# Patient Record
Sex: Female | Born: 1945
Health system: Southern US, Community
[De-identification: ages and names within clinical notes are randomized; demographics above are authoritative.]

## PROBLEM LIST (undated history)

## (undated) DIAGNOSIS — I509 Heart failure, unspecified: Secondary | ICD-10-CM

## (undated) DIAGNOSIS — E785 Hyperlipidemia, unspecified: Secondary | ICD-10-CM

## (undated) DIAGNOSIS — R0600 Dyspnea, unspecified: Secondary | ICD-10-CM

## (undated) DIAGNOSIS — F32A Depression, unspecified: Secondary | ICD-10-CM

## (undated) DIAGNOSIS — M316 Other giant cell arteritis: Secondary | ICD-10-CM

## (undated) DIAGNOSIS — I499 Cardiac arrhythmia, unspecified: Secondary | ICD-10-CM

## (undated) DIAGNOSIS — F329 Major depressive disorder, single episode, unspecified: Secondary | ICD-10-CM

## (undated) DIAGNOSIS — I10 Essential (primary) hypertension: Secondary | ICD-10-CM

## (undated) DIAGNOSIS — G43909 Migraine, unspecified, not intractable, without status migrainosus: Secondary | ICD-10-CM

## (undated) DIAGNOSIS — D589 Hereditary hemolytic anemia, unspecified: Secondary | ICD-10-CM

## (undated) DIAGNOSIS — M353 Polymyalgia rheumatica: Secondary | ICD-10-CM

## (undated) DIAGNOSIS — M109 Gout, unspecified: Secondary | ICD-10-CM

## (undated) DIAGNOSIS — F419 Anxiety disorder, unspecified: Secondary | ICD-10-CM

## (undated) DIAGNOSIS — J45909 Unspecified asthma, uncomplicated: Secondary | ICD-10-CM

## (undated) HISTORY — DX: Hyperlipidemia, unspecified: E78.5

## (undated) HISTORY — PX: EYE MUSCLE SURGERY: SHX370

## (undated) HISTORY — PX: BARTHOLIN GLAND CYST EXCISION: SHX565

## (undated) HISTORY — DX: Migraine, unspecified, not intractable, without status migrainosus: G43.909

## (undated) HISTORY — DX: Major depressive disorder, single episode, unspecified: F32.9

## (undated) HISTORY — DX: Other giant cell arteritis: M31.6

## (undated) HISTORY — DX: Gout, unspecified: M10.9

## (undated) HISTORY — DX: Polymyalgia rheumatica: M35.3

## (undated) HISTORY — PX: BREAST BIOPSY: SHX20

## (undated) HISTORY — DX: Anxiety disorder, unspecified: F41.9

## (undated) HISTORY — PX: TONSILLECTOMY: SUR1361

## (undated) HISTORY — DX: Depression, unspecified: F32.A

## (undated) HISTORY — PX: OTHER SURGICAL HISTORY: SHX169

## (undated) HISTORY — DX: Essential (primary) hypertension: I10

---

## 1998-08-22 ENCOUNTER — Other Ambulatory Visit: Admission: RE | Admit: 1998-08-22 | Discharge: 1998-08-22 | Payer: Self-pay | Admitting: Obstetrics and Gynecology

## 2000-12-26 ENCOUNTER — Other Ambulatory Visit: Admission: RE | Admit: 2000-12-26 | Discharge: 2000-12-26 | Payer: Self-pay

## 2002-08-20 HISTORY — PX: KNEE SURGERY: SHX244

## 2003-01-04 ENCOUNTER — Encounter: Admission: RE | Admit: 2003-01-04 | Discharge: 2003-02-12 | Payer: Self-pay | Admitting: Family Medicine

## 2003-04-05 ENCOUNTER — Encounter: Admission: RE | Admit: 2003-04-05 | Discharge: 2003-06-02 | Payer: Self-pay | Admitting: *Deleted

## 2003-05-17 ENCOUNTER — Encounter (INDEPENDENT_AMBULATORY_CARE_PROVIDER_SITE_OTHER): Payer: Self-pay | Admitting: Specialist

## 2003-05-17 ENCOUNTER — Ambulatory Visit (HOSPITAL_COMMUNITY): Admission: RE | Admit: 2003-05-17 | Discharge: 2003-05-17 | Payer: Self-pay | Admitting: Gynecology

## 2003-05-17 ENCOUNTER — Ambulatory Visit (HOSPITAL_BASED_OUTPATIENT_CLINIC_OR_DEPARTMENT_OTHER): Admission: RE | Admit: 2003-05-17 | Discharge: 2003-05-17 | Payer: Self-pay | Admitting: Gynecology

## 2003-07-09 ENCOUNTER — Other Ambulatory Visit: Admission: RE | Admit: 2003-07-09 | Discharge: 2003-07-09 | Payer: Self-pay | Admitting: Gynecology

## 2003-07-19 ENCOUNTER — Ambulatory Visit (HOSPITAL_COMMUNITY): Admission: RE | Admit: 2003-07-19 | Discharge: 2003-07-19 | Payer: Self-pay | Admitting: Gastroenterology

## 2004-08-27 ENCOUNTER — Emergency Department (HOSPITAL_COMMUNITY): Admission: EM | Admit: 2004-08-27 | Discharge: 2004-08-27 | Payer: Self-pay | Admitting: Emergency Medicine

## 2004-08-29 ENCOUNTER — Emergency Department (HOSPITAL_COMMUNITY): Admission: EM | Admit: 2004-08-29 | Discharge: 2004-08-29 | Payer: Self-pay | Admitting: *Deleted

## 2004-09-08 ENCOUNTER — Other Ambulatory Visit: Admission: RE | Admit: 2004-09-08 | Discharge: 2004-09-08 | Payer: Self-pay | Admitting: Gynecology

## 2006-01-07 ENCOUNTER — Other Ambulatory Visit: Admission: RE | Admit: 2006-01-07 | Discharge: 2006-01-07 | Payer: Self-pay | Admitting: Gynecology

## 2006-03-27 ENCOUNTER — Observation Stay (HOSPITAL_COMMUNITY): Admission: EM | Admit: 2006-03-27 | Discharge: 2006-03-27 | Payer: Self-pay | Admitting: Emergency Medicine

## 2007-01-15 ENCOUNTER — Other Ambulatory Visit: Admission: RE | Admit: 2007-01-15 | Discharge: 2007-01-15 | Payer: Self-pay | Admitting: Obstetrics and Gynecology

## 2008-01-13 ENCOUNTER — Emergency Department (HOSPITAL_COMMUNITY): Admission: EM | Admit: 2008-01-13 | Discharge: 2008-01-13 | Payer: Self-pay | Admitting: Family Medicine

## 2008-06-16 ENCOUNTER — Ambulatory Visit: Payer: Self-pay | Admitting: General Practice

## 2009-06-21 ENCOUNTER — Ambulatory Visit: Payer: Self-pay | Admitting: General Practice

## 2010-02-06 ENCOUNTER — Other Ambulatory Visit: Admission: RE | Admit: 2010-02-06 | Discharge: 2010-02-06 | Payer: Self-pay | Admitting: Obstetrics and Gynecology

## 2010-02-06 ENCOUNTER — Ambulatory Visit: Payer: Self-pay | Admitting: Obstetrics and Gynecology

## 2010-06-14 ENCOUNTER — Ambulatory Visit: Payer: Self-pay | Admitting: General Practice

## 2010-10-22 ENCOUNTER — Inpatient Hospital Stay (INDEPENDENT_AMBULATORY_CARE_PROVIDER_SITE_OTHER)
Admission: RE | Admit: 2010-10-22 | Discharge: 2010-10-22 | Disposition: A | Discharge status: Home / Self Care | Payer: BC Managed Care – PPO | Source: Ambulatory Visit | Attending: Family Medicine | Admitting: Family Medicine

## 2010-10-22 DIAGNOSIS — H5710 Ocular pain, unspecified eye: Secondary | ICD-10-CM

## 2011-01-05 NOTE — Consult Note (Signed)
[name], [name] NO.:  [id number]   MEDICAL RECORD NO.:  [id number]          PATIENT TYPE:  INP   LOCATION:  4703                         FACILITY:  [facility]   PHYSICIAN:  Francisca December, M.D.  DATE OF BIRTH:  10-18-1945   DATE OF CONSULTATION:  03/27/2006  DATE OF DISCHARGE:                                   CONSULTATION   REASON FOR CONSULTATION:  Chest pain.   HISTORY OF PRESENT ILLNESS:  Michelle Wilcox is a very pleasant, 65 year old,  Caucasian woman with known cardiac history.  Yesterday, around 4 p.m. or 5  p.m., after leaving her office and starting her care, she had the sudden  onset of left chest pressure.  This was over the left breast.  It felt as  someone was pushing down on her chest.  It radiated into the shoulder and  there was numbness around the breast and into the left arm.  There was no  shortness of breath, nausea, vomiting, diaphoresis or dizziness.  The pain  did not change with deep breaths or with lying or sitting.  She presented to  Urgent Care where she received a single sublingual nitroglycerin and was  transported to [facility] ER where she received additional sublingual  nitroglycerin without significant pain relief.  The discomfort in her chest  was waning by that time.  It finally resolved after about 1-1/2 hours and  receiving IV morphine in the emergency room.  At time of my evaluation at  noon on March 27, 2006, she is not having any discomfort.  She has not had  discomfort since it resolved.  She has not been short of breath.   PAST MEDICAL HISTORY:  1. Hypertension.  2. Borderline dyslipidemia.  3. History of hypoglycemia.  4. Osteoarthritis.   ALLERGIES:  PENICILLIN, SULFA, CIPRO, CEFTIN, DARVOCET.   CURRENT MEDICATIONS:  1. Metoprolol 50 mg in the a.m., 25 mg the p.m.  2. HCTZ 12.5 mg p.o. daily.  3. Aspirin 81 mg p.o. daily.  4. Tylenol 500 mg daily.  5. Glucosamine.  6. Chondroitin.  7. Multivitamin.   PAST  SURGICAL HISTORY:  She is status post right knee replacement.   FAMILY HISTORY:  She is adopted, but her mother biological mother is alive  and she had a stroke.  Her maternal uncle died at age 50 of a myocardial  infarction.   SOCIAL HISTORY:  She is divorced with one adult son.  She is employed as a  IT sales professional.  No tobacco or illicit drug use.  Occasional alcohol.  She  does exercise three times a week for over an hour each time aerobically.   REVIEW OF SYSTEMS:  She has had intentional 25 pound weight loss, otherwise  negative.   PHYSICAL EXAMINATION:  VITAL SIGNS:  Blood pressure is 124/75, pulse is 64  and regular, respiratory rate 20, temperature 97.8, O2 saturation on room  air 96%.  GENERAL:  This a well-appearing, comfortable, 65 year old woman in no acute  distress, appears younger than her stated age.  Well-kept.  HEENT:  Unremarkable.  NECK:  Neck is supple without JVD or carotid bruit.  There is bilateral  carotid upstrokes that are normal.  No thyromegaly.  CHEST:  Clear with good excursion.  HEART:  Regular rhythm.  Normal S1-S2, no S3-S4 murmur, click or rub noted.  ABDOMEN:  Soft, nontender, no hepatosplenomegaly or midline pulsatile mass.  PELVIC:  External genitalia without lesion.  RECTAL:  Not performed.  EXTREMITIES:  Extremities show full range of motion.  No edema.  Intact  distal pulses.  NEUROLOGICAL:  Cranial nerves 2-12 intact.  Motor and sensory grossly  intact.  Gait not tested.  SKIN:  Skin is warm, dry and clear.   LABORATORY DATA AND X-RAY FINDINGS:  Admission hemogram is normal, although  hematocrit level upper limit of abnormal.  Serum electrolytes are normal  with the exception of potassium at 3.0 and blood sugar of 148, nonfasting.  CK-MB, troponin negative x2.  LDL cholesterol 68, HDL 55.   Electrocardiogram normal sinus rhythm x2.  Chest x-ray with no active  cardiopulmonary disease.   IMPRESSION:  1. Atypical angina in a  65 year old woman with minimal risk factors for      coronary heart disease.  She is postmenopausal and has hypertension.      Lipids are really not excessively elevated.  2. Myocardial infarction, ruled out.  3. Hypokalemia.  4. Osteoarthritis.  5. Hyperglycemia  6. Hypertension, adequately control.   RECOMMENDATIONS:  I agree with your management thus far which includes  aspirin and rule out a myocardial function with telemetry monitoring.  This  has been completed.  The patient needs exercise treadmill testing with  Cardiolite myocardial perfusion imaging.  Will arrange for this test as an  outpatient in my office tomorrow.  She is to be there at noon.  I have  instructed her to not eat, no caffeine x12 hours and not to take her  metoprolol in the morning.   Thank you very much for allowing [place] to assist in the care of [name].  It has been a pleasure to do so.  I will discuss her further care  with you.      Francisca December, M.D.  Electronically Signed    JHE/MEDQ  D:  03/27/2006  T:  03/27/2006  Job:  [id number]   cc:   Pam Drown, M.D.

## 2011-01-05 NOTE — H&P (Signed)
NAMEGABRIELL, Michelle Wilcox                 ACCOUNT NO.:  [id number]   MEDICAL RECORD NO.:  [id number]          PATIENT TYPE:  INP   LOCATION:  4703                         FACILITY:  [facility]   PHYSICIAN:  Kela Millin, M.D.DATE OF BIRTH:  01/16/1946   DATE OF ADMISSION:  03/26/2006  DATE OF DISCHARGE:                                HISTORY & PHYSICAL   The primary care physician is Dr. Uvaldo Rising.   CHIEF COMPLAINT:  Chest pain.   HISTORY OF PRESENT ILLNESS:  The patient is a 65 year old white female, with  past medical history significant for hypertension and borderline  cholesterol, who presents with complaints of chest pain.  She states that  after leaving her office today she walked through the parking lot to her car  and just after she started the car she began experiencing an intense left  precordial pressure 8/10 in intensity.  She states that it was associated  with left arm numbness.  She denies any shortness of breath.  Also no  associated nausea and vomiting.  She reports that she usually exercises  regularly and has been doing well lately, intentionally losing about 25  pounds with an improvement in her blood pressure.  She states that she has  had a lot of anxiety lately over some home repairs that she needs to do.  The patient denies cough, fevers, dysuria, diarrhea, hematochezia.  No  melena.  The patient states that she initially went to urgent care where she  was given some nitroglycerin with some initial improvement of the pain to  about 3/10 in intensity and then she was transferred to the [facility] ER where  she received more nitroglycerin, and the pain remained about the same that  is 3/10 in intensity until she was given morphine which completely relieved  the pain.   The patient was seen in the ER, and an EGD showed normal sinus rhythm with  no ischemic changes.  Point of care markers were negative.  She was admitted  to the [facility] for further  evaluation and management.   PAST MEDICAL HISTORY:  1. As stated above.  2. History of hypoglycemia.   PAST SURGICAL HISTORY:  Status post right knee surgery.   MEDICATIONS:  1. Metoprolol 50 mg p.o. q.a.m. and 25 mg p.o. q.p.m.  2. HCTZ.  3. Aspirin 81 mg daily.  4. Tylenol p.r.n.  5. Glucosamine.   ALLERGIES:  PENICILLIN, SULFA, CIPRO, CEFTIN and DARVOCET.   SOCIAL HISTORY:  She denies tobacco.  Occasional alcohol.   FAMILY HISTORY:  She states that she is adopted but knows that her  biological mother had a stroke, and her uncle died of an MI in his 47s.   REVIEW OF SYSTEMS:  As per HPI.  Other review of systems negative.   PHYSICAL EXAMINATION:  GENERAL:  The patient is an elderly white female,  anxious appearing, in no respiratory distress.  VITAL SIGNS:  Her temperature is 97 and blood pressure 125/73.  Initially  143/73.  Her pulse is 85.  Respiratory rate is 18.  O2 sat is  100%.  HEENT:  PERRL.  EOMI.  Sclerae are anicteric.  Moist mucous membranes.  No  oral exudates.  NECK:  Supple.  No adenopathy.  No thyromegaly.  LUNGS:  Clear to auscultation bilaterally.  No crackles or wheezes.  CARDIOVASCULAR:  Regular rate and rhythm.  Normal S1 and S2.  No murmurs  appreciated and no gallops.  ABDOMEN:  Soft.  Bowel sounds present.  Nontender and nondistended.  No organomegaly.  No masses palpable.  EXTREMITIES:  No cyanosis and no edema.  NEURO:  Alert and oriented x3.  Cranial nerves II-XII are grossly intact.  Nonfocal exam.   LABORATORY DATA:  Point of care makers:  Negative x1.  Hemoglobin is 12.1,  hematocrit 34.6, white cell count 5.9, platelet count 231.  Sodium 139,  potassium 3.0, chloride 101, CO2 31, glucose 148, BUN 8, creatinine 0.8.  Total bilirubin is 0.4.  Alkaline phosphatase is 41.  SGPT is 11.  Total  protein is 5.4.  Albumin 3.6.  Calcium 8.4.  EKG:  Normal sinus rhythm at a  rate of 80.  No ischemic changes.   ASSESSMENT/PLAN:  1. Chest pain.  As  discussed above, we will obtain serial cardiac enzymes,      EKG.  Place the patient on aspirin, nitrates and Continue beta-      blockers.  Consider cardiology consultation in the a.m. pending above      results for risk stratification.  We will cover with PPI for GI      etiology.  2. Hypokalemia.  Replace potassium.  3. Hypertension.  Continue metoprolol and HCTZ.  4. History of borderline cholesterol.  Recheck a fasting lipid profile      and follow.      Kela Millin, M.D.  Electronically Signed     ACV/MEDQ  D:  03/27/2006  T:  03/27/2006  Job:  [id number]   cc:   Pam Drown, M.D.  Fax: [phone]

## 2011-01-05 NOTE — H&P (Signed)
[name], Michelle Wilcox                             ACCOUNT NO.:  [id number]   MEDICAL RECORD NO.:  [id number]                    PATIENT TYPE:   LOCATION:                                       FACILITY:   PHYSICIAN:  Ivor Costa. Farrel Gobble, M.D.              DATE OF BIRTH:   DATE OF ADMISSION:  DATE OF DISCHARGE:                                HISTORY & PHYSICAL   CHIEF COMPLAINT:  Pelvic vulvar mass.   HISTORY OF PRESENT ILLNESS:  The patient is a 65 year old, G1, P3,  postmenopausal woman who presented to the office on April 24, 2003  complaining of mass in her vagina which she noted after she started working  out on a recumbent bike as a result of physical therapy for arthroscopic  surgery.  The patient states that she noticed some vaginal soreness, at  which point she noticed the mass, and shortly thereafter some vaginal  bleeding.  She was unsure of the source.   She had a history of Bartholin's gland cyst which she thinks may have been  bilateral.  She was unsure of treatment but has had treatment to each of  them.  She was examined in the office and there was indeed some induration  with fullness on the left, questionably in the region of the Bartholin's  gland.  No fluid was expressed.  Based on her history, the patient was  treated with antibiotics and Sitz baths t.i.d. and was asked to return back  to the office two weeks later.   Despite compliance, the patient states while the tenderness has gotten  better, the area of concern has not changed appreciably.  It was noted to be  firm and approximately 2-1/2 cm in its largest diameter.  Because of the  episode of postmenopausal bleeding and the source being unclear, she had  endometrial biopsy which was negative.   PAST OBSTETRICAL/GYNECOLOGIC HISTORY:  Significant for menopause  approximately four to five years ago.  This would have been her first  episode of postmenopausal bleeding.  Although the source is unclear, she had  been getting Pap smears at [facility].  She has had one  spontaneous vaginal delivery.  No other GYN complaints.   PAST MEDICAL HISTORY:  Significant for chronic hypertension and anxiety.   PAST SURGICAL HISTORY:  She had ocular surgery as a child.  She had  tonsillectomy as a child.  She had treatment to a Bartholin's cyst but is  unsure of what.  She had arthroscopy in 2004.   MEDICATIONS:  Atenolol, Vioxx, Celexa, glucosamine, over-the-counter  vitamins.   SOCIAL HISTORY:  She denies tobacco.  She drinks alcohol moderately,  caffeine moderately and exercises as part of physical therapy which has been  on hold since April 27, 2003.   FAMILY HISTORY:  Negative for gynecologic cancers.  There is family history  of colon cancer in  an uncle and coronary artery disease in both parents.   ALLERGIES:  She is allergic to PENICILLIN, SULFA, DARVOCET, and CIPRO.   PHYSICAL EXAMINATION:  GENERAL:  She is well-appearing and in no acute  distress.  HEART:  Regular rate.  LUNGS:  Clear to auscultation.  ABDOMEN:  Soft and nontender without rebound or guarding.  GYNECOLOGIC:  There is a deep scar on the right from a previous I&D or Ward  catheter placement.  On the left, there is induration of firmness that  measuring approximately 2 by 2-1/2 cm.  There was no warmth or induration  noted.  On speculum exam, she has postmenopausal changes to the vagina or  cervix.  Bimanual exam shows there is no cervical motion tenderness.  The  uterus was small and mobile.  The adnexa was negative.  RECTOVAGINAL:  Confirmatory.   IMPRESSION:  Persistent vulvar mass with firmness in a postmenopausal woman.  In part because of her anxiety, as well as her size, I deferred a biopsy in  the office and elected instead to excise the area in the operating room.  Risks and benefits were discussed with the patient and acceptable.  She will  present for definitive therapy in the morning.                                                  Ivor Costa. Farrel Gobble, M.D.    THL/MEDQ  D:  05/16/2003  T:  05/17/2003  Job:  [id number]

## 2011-01-05 NOTE — Op Note (Signed)
NAME:  Michelle Wilcox, Michelle Wilcox                           ACCOUNT NO.:  [id number]   MEDICAL RECORD NO.:  [id number]                   PATIENT TYPE:  AMB   LOCATION:  ENDO                                 FACILITY:  [facility]   PHYSICIAN:  Petra Kuba, M.D.                 DATE OF BIRTH:  10-11-1945   DATE OF PROCEDURE:  07/19/2003  DATE OF DISCHARGE:                                 OPERATIVE REPORT   PROCEDURE:  Colonoscopy.   INDICATIONS:  Family history of colon cancer.  Due for colonic screening.  Consent was signed after risks, benefits, methods, and options were  thoroughly discussed in the office.   PREMEDICATIONS:  Demerol 70 mg, Versed 7 mg.   DESCRIPTION OF PROCEDURE:  Rectal inspection pertinent for small external  hemorrhoids.  Digital exam was negative.  Pediatric video adjustable  colonoscope was inserted and easily advanced to the level of the hepatic  flexure.  To advance to the cecum required rolling her on her back and  various abdominal pressures.  No obvious abnormality was seen on insertion.  The cecum was identified by the appendiceal orifice and the ileocecal valve.  The scope was inserted a short ways into the terminal ileum which was  normal.  Photo documentation was obtained.  The scope was slowly withdrawn.  Prep was adequate.  There was some liquid stool that required washing and  suctioning, but on slow withdrawal back to the rectum, no abnormalities were  seen.  Specifically no polyps, tumors, masses, or diverticula.  Anal rectal  pull and retroflexion confirmed some small hemorrhoids.  Scope was  reinserted a short ways up the left side of the colon.  Air was suctioned  and the scope removed.  The patient tolerated the procedure well.  There  were no obvious immediate complications.   ENDOSCOPIC DIAGNOSES:  1. Internal and external hemorrhoids.  2. Otherwise within normal limits to the terminal ileum.   PLAN:  Yearly rectals and guaiacs per Dr. Tenny Craw.  Happy to  see back p.r.n.  Otherwise repeat screening in five years.                                               Petra Kuba, M.D.    MEM/MEDQ  D:  07/19/2003  T:  07/19/2003  Job:  [id number]   cc:   C. Duane Lope, M.D.  [address]  [place]  [place] [address]  Fax: [phone]

## 2011-01-05 NOTE — Op Note (Signed)
   NAME:  Michelle Wilcox, Michelle Wilcox                           ACCOUNT NO.:  [id number]   MEDICAL RECORD NO.:  [id number]                   PATIENT TYPE:  AMB   LOCATION:  NESC                                 FACILITY:  [facility]   PHYSICIAN:  Ivor Costa. Farrel Gobble, M.D.              DATE OF BIRTH:  March 21, 1946   DATE OF PROCEDURE:  05/17/2003  DATE OF DISCHARGE:                                 OPERATIVE REPORT   PREOPERATIVE DIAGNOSIS:  Left labial lesion.   POSTOPERATIVE DIAGNOSIS:  Left labial lesion.   OPERATION/PROCEDURE:  Excision and biopsy.   SURGEON:  Ivor Costa. Farrel Gobble, M.D.   ANESTHESIA:  General.   ESTIMATED BLOOD LOSS:  Minimal.   FINDINGS:  There was an indurated left labial lesion.   PATHOLOGY:  Excisional biopsy.   DESCRIPTION OF PROCEDURE:  The patient was taken to the operating room and  general anesthesia was induced.  She was placed in the dorsal lithotomy  position, prepped and draped in the usual sterile fashion.  The area of  concern was noted.  Upon examination it was injected with a total of 4 mL of  0.5% Marcaine with epinephrine.  We attempted to grasp the area with an  Allis and elevate it which was done after the skin was scored partially with  gentle traction from the Allis.  The area of induration was excised.  There  was what looked like old Bartholin's fluid noted that was brown in  discoloration.  The area of induration was sharply excised off.  The area  was then flushed copious amounts of warm saline.  There was no further  discharge that was appreciated.  The dead space was then reapproximated with  3-0 Vicryl.  The skin was closed with 3-0 Vicryl in a similar fashion, again  obliterating the dead space.  The patient tolerated the procedure well.  Sponge, lap and needle counts were correct x2.  She was extubated in the OR  and transferred to the PACU in stable condition.                                                 Ivor Costa. Farrel Gobble, M.D.    THL/MEDQ  D:   05/17/2003  T:  05/17/2003  Job:  [id number]

## 2011-01-05 NOTE — Discharge Summary (Signed)
Michelle Wilcox, Michelle Wilcox                 ACCOUNT NO.:  [id number]   MEDICAL RECORD NO.:  [id number]          PATIENT TYPE:  INP   LOCATION:  4703                         FACILITY:  [facility]   PHYSICIAN:  Michelene Gardener, MD    DATE OF BIRTH:  Dec 25, 1945   DATE OF ADMISSION:  03/26/2006  DATE OF DISCHARGE:  03/27/2006                                 DISCHARGE SUMMARY   DISCHARGE DIAGNOSES:  1. Chest pain.  2. Hypokalemia.  3. Hypertension.  4. Borderline hypercholesteremia.  5. Anxiety.   DISCHARGE MEDICATIONS:  1. Metoprolol 50 mg p.o. in a.m., 50 mg p.o. at p.m.  2. Hydrochlorothiazide, same dose as taken at home.  3. Aspirin 81 mg p.o. once daily.  4. Glucosamine, same dose as taken at home.   CONSULTATIONS:  Cardiology consult with Dr. Amil Amen.   PROCEDURES:  None.   REASON FOR HOSPITALIZATION:  Shortness of breath with some nausea.   COURSE OF HOSPITALIZATION:  1. Chest pain.  This patient was admitted to the hospital for further      evaluation.  Was monitored in telemetry.  Three sets of troponin and      cardiac enzymes normal.  While in the hospital, the patient was started      on aspirin and was continued on her metoprolol.  Her troponins were      normal and electrocardiogram was normal.  Cardiac consultation was done      and Cardiology decided to do outpatient stress test.  On time of      discharge, the patient was stable, no more chest pain.  2. Hypokalemia.  Potassium was given.  3. Hypertension.  Same medications were continued.  4. History of borderline cholesterol.  Lipid profile was done in the      hospital and showed normal profile with cholesterol of 135,      triglycerides 60.  HDL 55, LDL 68.  DLDL is 12.      Michelene Gardener, MD  Electronically Signed     NAE/MEDQ  D:  03/27/2006  T:  03/27/2006  Job:  [id number]

## 2011-06-13 ENCOUNTER — Ambulatory Visit: Payer: Self-pay

## 2011-07-09 ENCOUNTER — Other Ambulatory Visit (HOSPITAL_COMMUNITY)
Admission: RE | Admit: 2011-07-09 | Discharge: 2011-07-09 | Disposition: A | Discharge status: Home / Self Care | Payer: BC Managed Care – PPO | Source: Ambulatory Visit | Attending: Family Medicine | Admitting: Family Medicine

## 2011-07-09 ENCOUNTER — Other Ambulatory Visit: Payer: Self-pay | Admitting: Family Medicine

## 2011-07-09 DIAGNOSIS — Z124 Encounter for screening for malignant neoplasm of cervix: Secondary | ICD-10-CM | POA: Insufficient documentation

## 2011-07-09 DIAGNOSIS — Z1159 Encounter for screening for other viral diseases: Secondary | ICD-10-CM | POA: Insufficient documentation

## 2012-06-09 ENCOUNTER — Ambulatory Visit (INDEPENDENT_AMBULATORY_CARE_PROVIDER_SITE_OTHER): Payer: BC Managed Care – PPO | Admitting: Licensed Clinical Social Worker

## 2012-06-09 DIAGNOSIS — F411 Generalized anxiety disorder: Secondary | ICD-10-CM

## 2012-06-11 ENCOUNTER — Ambulatory Visit: Payer: Self-pay

## 2012-06-12 ENCOUNTER — Ambulatory Visit: Payer: Self-pay

## 2012-06-16 ENCOUNTER — Ambulatory Visit (INDEPENDENT_AMBULATORY_CARE_PROVIDER_SITE_OTHER): Payer: BC Managed Care – PPO | Admitting: Licensed Clinical Social Worker

## 2012-06-16 DIAGNOSIS — F411 Generalized anxiety disorder: Secondary | ICD-10-CM

## 2012-06-17 ENCOUNTER — Encounter (INDEPENDENT_AMBULATORY_CARE_PROVIDER_SITE_OTHER): Payer: Self-pay

## 2012-06-25 ENCOUNTER — Encounter (INDEPENDENT_AMBULATORY_CARE_PROVIDER_SITE_OTHER): Payer: Self-pay

## 2012-06-25 ENCOUNTER — Encounter (INDEPENDENT_AMBULATORY_CARE_PROVIDER_SITE_OTHER): Payer: Self-pay | Admitting: General Surgery

## 2012-06-26 ENCOUNTER — Encounter (INDEPENDENT_AMBULATORY_CARE_PROVIDER_SITE_OTHER): Payer: Self-pay

## 2012-07-01 ENCOUNTER — Encounter (INDEPENDENT_AMBULATORY_CARE_PROVIDER_SITE_OTHER): Payer: Self-pay | Admitting: Surgery

## 2012-07-01 ENCOUNTER — Ambulatory Visit (INDEPENDENT_AMBULATORY_CARE_PROVIDER_SITE_OTHER): Payer: BC Managed Care – PPO | Admitting: Surgery

## 2012-07-01 VITALS — BP 132/88 | HR 76 | Temp 97.8°F | Resp 18 | Ht 64.0 in | Wt 191.6 lb

## 2012-07-01 DIAGNOSIS — N6310 Unspecified lump in the right breast, unspecified quadrant: Secondary | ICD-10-CM | POA: Insufficient documentation

## 2012-07-01 DIAGNOSIS — N63 Unspecified lump in unspecified breast: Secondary | ICD-10-CM

## 2012-07-01 NOTE — Patient Instructions (Signed)
We will arrange a biopsy of the mass seen on your recent mammogram and ultrasound. Once we have pathology will make further plans.

## 2012-07-01 NOTE — Progress Notes (Signed)
NAME: FARHIYA VANNATTER DOB: 04/18/1946 MRN: [id number]                                                                                      DATE: 07/01/2012  PCP: Gweneth Dimitri, MD Referring Provider: Gweneth Dimitri, MD  IMPRESSION:  Right breast mass, uncertain etilogy  PLAN:   Will arrange a NCB                 CC:  Chief Complaint  Patient presents with  . Breast Mass    right    HPI:  NOBUKO PROTO is a 66 y.o.  female who presents for evaluation of a right breast mass,found by recent routine screening mammogram in ultrasound and to evaluate some nodular density seen on the mammogram. Biopsy is not done. The patient was unaware of any mass. She's never had a prior breast problems. She has a negative family history for breast cancer.  PMH:  has a past medical history of Hyperlipidemia; Gouty arthropathy; Hypertension; Depression; Anxiety; Giant cell arteritis; Polymyalgia; and Migraines.  PSH:   has past surgical history that includes Eye muscle surgery; toncil; Tonsillectomy; Bartholin gland cyst excision; and Knee surgery (2004).  ALLERGIES:   Allergies  Allergen Reactions  . Penicillins Anaphylaxis  . Ceftin (Cefuroxime Axetil)   . Ciprofloxacin   . Darvocet (Propoxyphene-Acetaminophen)   . Levaquin (Levofloxacin In D5w) Hives  . Sulfa Antibiotics Hives  . Zyrtec (Cetirizine Hcl)     headache    MEDICATIONS: Current outpatient prescriptions:aspirin 81 MG tablet, Take 81 mg by mouth daily., Disp: , Rfl: ;  Calcium-Vitamin D-Vitamin K (VIACTIV) 500-500-40 MG-UNT-MCG CHEW, Chew by mouth., Disp: , Rfl: ;  cholecalciferol (VITAMIN D) 1000 UNITS tablet, Take 400 Units by mouth daily. , Disp: , Rfl: ;  diphenhydrAMINE (SOMINEX) 25 MG tablet, Take 25 mg by mouth at bedtime as needed., Disp: , Rfl:  folic acid (FOLVITE) 1 MG tablet, Take 1 mg by mouth 4 (four) times daily. , Disp: , Rfl: ;  glucosamine-chondroitin 500-400 MG tablet, Take 1 tablet by mouth 2 (two) times daily. ,  Disp: , Rfl: ;  methotrexate (RHEUMATREX) 2.5 MG tablet, Take 7.5 mg by mouth 3 (three) times daily. , Disp: , Rfl: ;  metoprolol (LOPRESSOR) 50 MG tablet, Take 50 mg by mouth 2 (two) times daily. 1 1/2 qd, Disp: , Rfl:  Multiple Vitamin (MULTIVITAMIN) tablet, Take 1 tablet by mouth daily., Disp: , Rfl: ;  omeprazole (PRILOSEC OTC) 20 MG tablet, Take 20 mg by mouth daily., Disp: , Rfl: ;  predniSONE (DELTASONE) 5 MG tablet, Take 5 mg by mouth daily., Disp: , Rfl: ;  simethicone (GAS-X) 80 MG chewable tablet, Chew 80 mg by mouth every 6 (six) hours as needed., Disp: , Rfl:   ROS: She has filled out our 12 point review of systems and it is negative except for "weakness" and arthritis. EXAM:   VS; BP 132/88  Pulse 76  Temp 97.8 F (36.6 C) (Temporal)  Resp 18  Ht 5\' 4"  (1.626 m)  Wt 191 lb 9.6 oz (86.909 kg)  BMI 32.89 kg/m2 Gen.: Patient alert oriented healthy-appearing Breasts: Symmetric.  They're nontender. No mass. Skin nipple and areolar areas look normal. Lymphatics: No axillary supraclavicular adenopathy  DATA REVIEWED:  I seen the mammogram films ultrasound films and reports.    Taja Pentland J 07/01/2012  CC: Gweneth Dimitri, MD, Gweneth Dimitri, MD

## 2012-07-10 ENCOUNTER — Other Ambulatory Visit (INDEPENDENT_AMBULATORY_CARE_PROVIDER_SITE_OTHER): Payer: Self-pay | Admitting: Surgery

## 2012-07-10 ENCOUNTER — Ambulatory Visit
Admission: RE | Admit: 2012-07-10 | Discharge: 2012-07-10 | Disposition: A | Discharge status: Home / Self Care | Payer: BC Managed Care – PPO | Source: Ambulatory Visit | Attending: Surgery | Admitting: Surgery

## 2012-07-10 DIAGNOSIS — N6310 Unspecified lump in the right breast, unspecified quadrant: Secondary | ICD-10-CM

## 2012-07-10 DIAGNOSIS — N63 Unspecified lump in unspecified breast: Secondary | ICD-10-CM

## 2012-07-21 ENCOUNTER — Ambulatory Visit (INDEPENDENT_AMBULATORY_CARE_PROVIDER_SITE_OTHER): Payer: BC Managed Care – PPO | Admitting: Licensed Clinical Social Worker

## 2012-07-21 DIAGNOSIS — F411 Generalized anxiety disorder: Secondary | ICD-10-CM

## 2012-08-18 ENCOUNTER — Ambulatory Visit: Payer: BC Managed Care – PPO | Admitting: Licensed Clinical Social Worker

## 2012-09-01 ENCOUNTER — Ambulatory Visit (INDEPENDENT_AMBULATORY_CARE_PROVIDER_SITE_OTHER): Payer: No Typology Code available for payment source | Admitting: Licensed Clinical Social Worker

## 2012-09-01 DIAGNOSIS — F411 Generalized anxiety disorder: Secondary | ICD-10-CM

## 2012-09-29 ENCOUNTER — Ambulatory Visit (INDEPENDENT_AMBULATORY_CARE_PROVIDER_SITE_OTHER): Payer: No Typology Code available for payment source | Admitting: Licensed Clinical Social Worker

## 2012-09-29 DIAGNOSIS — F411 Generalized anxiety disorder: Secondary | ICD-10-CM

## 2012-10-27 ENCOUNTER — Ambulatory Visit (INDEPENDENT_AMBULATORY_CARE_PROVIDER_SITE_OTHER): Payer: No Typology Code available for payment source | Admitting: Licensed Clinical Social Worker

## 2012-11-24 ENCOUNTER — Ambulatory Visit: Payer: No Typology Code available for payment source | Admitting: Licensed Clinical Social Worker

## 2012-12-15 ENCOUNTER — Ambulatory Visit (INDEPENDENT_AMBULATORY_CARE_PROVIDER_SITE_OTHER): Payer: No Typology Code available for payment source | Admitting: Licensed Clinical Social Worker

## 2012-12-15 DIAGNOSIS — F411 Generalized anxiety disorder: Secondary | ICD-10-CM

## 2013-02-09 ENCOUNTER — Ambulatory Visit (INDEPENDENT_AMBULATORY_CARE_PROVIDER_SITE_OTHER): Payer: No Typology Code available for payment source | Admitting: Licensed Clinical Social Worker

## 2013-02-09 DIAGNOSIS — F411 Generalized anxiety disorder: Secondary | ICD-10-CM

## 2013-04-06 ENCOUNTER — Ambulatory Visit (INDEPENDENT_AMBULATORY_CARE_PROVIDER_SITE_OTHER): Payer: No Typology Code available for payment source | Admitting: Licensed Clinical Social Worker

## 2013-04-06 DIAGNOSIS — F411 Generalized anxiety disorder: Secondary | ICD-10-CM

## 2013-05-04 ENCOUNTER — Ambulatory Visit (INDEPENDENT_AMBULATORY_CARE_PROVIDER_SITE_OTHER): Payer: No Typology Code available for payment source | Admitting: Licensed Clinical Social Worker

## 2013-05-04 DIAGNOSIS — F411 Generalized anxiety disorder: Secondary | ICD-10-CM

## 2013-05-25 ENCOUNTER — Other Ambulatory Visit: Payer: Self-pay

## 2013-05-25 DIAGNOSIS — Z1231 Encounter for screening mammogram for malignant neoplasm of breast: Secondary | ICD-10-CM

## 2013-06-08 ENCOUNTER — Ambulatory Visit (INDEPENDENT_AMBULATORY_CARE_PROVIDER_SITE_OTHER): Payer: No Typology Code available for payment source | Admitting: Licensed Clinical Social Worker

## 2013-06-08 DIAGNOSIS — F411 Generalized anxiety disorder: Secondary | ICD-10-CM

## 2013-06-22 ENCOUNTER — Ambulatory Visit
Admission: RE | Admit: 2013-06-22 | Discharge: 2013-06-22 | Disposition: A | Discharge status: Home / Self Care | Payer: No Typology Code available for payment source | Source: Ambulatory Visit

## 2013-06-22 DIAGNOSIS — Z1231 Encounter for screening mammogram for malignant neoplasm of breast: Secondary | ICD-10-CM

## 2013-07-13 ENCOUNTER — Ambulatory Visit: Payer: No Typology Code available for payment source | Admitting: Licensed Clinical Social Worker

## 2013-07-27 ENCOUNTER — Ambulatory Visit (INDEPENDENT_AMBULATORY_CARE_PROVIDER_SITE_OTHER): Payer: No Typology Code available for payment source | Admitting: Licensed Clinical Social Worker

## 2013-07-27 DIAGNOSIS — F411 Generalized anxiety disorder: Secondary | ICD-10-CM

## 2013-08-24 ENCOUNTER — Ambulatory Visit (INDEPENDENT_AMBULATORY_CARE_PROVIDER_SITE_OTHER): Payer: No Typology Code available for payment source | Admitting: Licensed Clinical Social Worker

## 2013-08-24 DIAGNOSIS — F411 Generalized anxiety disorder: Secondary | ICD-10-CM

## 2013-10-05 ENCOUNTER — Ambulatory Visit (INDEPENDENT_AMBULATORY_CARE_PROVIDER_SITE_OTHER): Payer: No Typology Code available for payment source | Admitting: Licensed Clinical Social Worker

## 2013-10-05 DIAGNOSIS — F411 Generalized anxiety disorder: Secondary | ICD-10-CM

## 2013-10-26 ENCOUNTER — Ambulatory Visit (INDEPENDENT_AMBULATORY_CARE_PROVIDER_SITE_OTHER): Payer: No Typology Code available for payment source | Admitting: Licensed Clinical Social Worker

## 2013-10-26 DIAGNOSIS — F411 Generalized anxiety disorder: Secondary | ICD-10-CM

## 2013-11-09 ENCOUNTER — Ambulatory Visit (INDEPENDENT_AMBULATORY_CARE_PROVIDER_SITE_OTHER): Payer: No Typology Code available for payment source | Admitting: Licensed Clinical Social Worker

## 2013-11-09 DIAGNOSIS — F411 Generalized anxiety disorder: Secondary | ICD-10-CM

## 2013-11-30 ENCOUNTER — Ambulatory Visit (INDEPENDENT_AMBULATORY_CARE_PROVIDER_SITE_OTHER): Payer: No Typology Code available for payment source | Admitting: Licensed Clinical Social Worker

## 2013-11-30 DIAGNOSIS — F411 Generalized anxiety disorder: Secondary | ICD-10-CM

## 2013-12-28 ENCOUNTER — Ambulatory Visit (INDEPENDENT_AMBULATORY_CARE_PROVIDER_SITE_OTHER): Payer: No Typology Code available for payment source | Admitting: Licensed Clinical Social Worker

## 2013-12-28 DIAGNOSIS — F411 Generalized anxiety disorder: Secondary | ICD-10-CM

## 2014-02-08 ENCOUNTER — Ambulatory Visit (INDEPENDENT_AMBULATORY_CARE_PROVIDER_SITE_OTHER): Payer: No Typology Code available for payment source | Admitting: Licensed Clinical Social Worker

## 2014-02-08 DIAGNOSIS — F411 Generalized anxiety disorder: Secondary | ICD-10-CM

## 2014-03-22 ENCOUNTER — Ambulatory Visit (INDEPENDENT_AMBULATORY_CARE_PROVIDER_SITE_OTHER): Payer: No Typology Code available for payment source | Admitting: Licensed Clinical Social Worker

## 2014-03-22 DIAGNOSIS — F411 Generalized anxiety disorder: Secondary | ICD-10-CM

## 2014-05-17 ENCOUNTER — Ambulatory Visit (INDEPENDENT_AMBULATORY_CARE_PROVIDER_SITE_OTHER): Payer: No Typology Code available for payment source | Admitting: Licensed Clinical Social Worker

## 2014-05-17 DIAGNOSIS — F411 Generalized anxiety disorder: Secondary | ICD-10-CM

## 2014-06-11 ENCOUNTER — Ambulatory Visit (INDEPENDENT_AMBULATORY_CARE_PROVIDER_SITE_OTHER): Payer: No Typology Code available for payment source | Admitting: Licensed Clinical Social Worker

## 2014-06-11 DIAGNOSIS — F419 Anxiety disorder, unspecified: Secondary | ICD-10-CM

## 2014-07-05 ENCOUNTER — Ambulatory Visit (INDEPENDENT_AMBULATORY_CARE_PROVIDER_SITE_OTHER): Payer: No Typology Code available for payment source | Admitting: Licensed Clinical Social Worker

## 2014-07-05 DIAGNOSIS — F419 Anxiety disorder, unspecified: Secondary | ICD-10-CM

## 2014-08-30 ENCOUNTER — Ambulatory Visit (INDEPENDENT_AMBULATORY_CARE_PROVIDER_SITE_OTHER): Payer: No Typology Code available for payment source | Admitting: Licensed Clinical Social Worker

## 2014-08-30 DIAGNOSIS — F419 Anxiety disorder, unspecified: Secondary | ICD-10-CM

## 2014-11-01 ENCOUNTER — Ambulatory Visit (INDEPENDENT_AMBULATORY_CARE_PROVIDER_SITE_OTHER): Payer: No Typology Code available for payment source | Admitting: Licensed Clinical Social Worker

## 2014-11-01 DIAGNOSIS — F419 Anxiety disorder, unspecified: Secondary | ICD-10-CM | POA: Diagnosis not present

## 2014-11-29 ENCOUNTER — Ambulatory Visit (INDEPENDENT_AMBULATORY_CARE_PROVIDER_SITE_OTHER): Payer: No Typology Code available for payment source | Admitting: Licensed Clinical Social Worker

## 2014-11-29 DIAGNOSIS — F419 Anxiety disorder, unspecified: Secondary | ICD-10-CM

## 2014-12-27 ENCOUNTER — Ambulatory Visit (INDEPENDENT_AMBULATORY_CARE_PROVIDER_SITE_OTHER): Payer: No Typology Code available for payment source | Admitting: Licensed Clinical Social Worker

## 2014-12-27 DIAGNOSIS — F419 Anxiety disorder, unspecified: Secondary | ICD-10-CM | POA: Diagnosis not present

## 2015-01-31 ENCOUNTER — Ambulatory Visit (INDEPENDENT_AMBULATORY_CARE_PROVIDER_SITE_OTHER): Payer: No Typology Code available for payment source | Admitting: Licensed Clinical Social Worker

## 2015-01-31 DIAGNOSIS — F419 Anxiety disorder, unspecified: Secondary | ICD-10-CM

## 2015-03-14 ENCOUNTER — Ambulatory Visit (INDEPENDENT_AMBULATORY_CARE_PROVIDER_SITE_OTHER): Payer: No Typology Code available for payment source | Admitting: Licensed Clinical Social Worker

## 2015-03-14 DIAGNOSIS — F419 Anxiety disorder, unspecified: Secondary | ICD-10-CM

## 2015-04-11 ENCOUNTER — Other Ambulatory Visit: Payer: Self-pay | Admitting: Dermatology

## 2015-05-09 ENCOUNTER — Ambulatory Visit (INDEPENDENT_AMBULATORY_CARE_PROVIDER_SITE_OTHER): Payer: No Typology Code available for payment source | Admitting: Licensed Clinical Social Worker

## 2015-05-09 DIAGNOSIS — F419 Anxiety disorder, unspecified: Secondary | ICD-10-CM | POA: Diagnosis not present

## 2015-05-24 ENCOUNTER — Other Ambulatory Visit: Payer: Self-pay | Admitting: Family Medicine

## 2015-05-25 LAB — CMP12+LP+TP+TSH+6AC+CBC/D/PLT
A/G RATIO: 2.8 — AB (ref 1.1–2.5)
ALT: 23 IU/L (ref 0–32)
AST: 36 IU/L (ref 0–40)
Albumin: 4.4 g/dL (ref 3.5–4.8)
Alkaline Phosphatase: 86 IU/L (ref 39–117)
BASOS ABS: 0.1 10*3/uL (ref 0.0–0.2)
BILIRUBIN TOTAL: 0.8 mg/dL (ref 0.0–1.2)
BUN/Creatinine Ratio: 26 (ref 11–26)
BUN: 18 mg/dL (ref 8–27)
Basos: 1 %
CHOL/HDL RATIO: 2.8 ratio (ref 0.0–4.4)
Calcium: 9.6 mg/dL (ref 8.7–10.3)
Chloride: 100 mmol/L (ref 97–108)
Cholesterol, Total: 159 mg/dL (ref 100–199)
Creatinine, Ser: 0.7 mg/dL (ref 0.57–1.00)
EOS (ABSOLUTE): 0.1 10*3/uL (ref 0.0–0.4)
Eos: 2 %
Estimated CHD Risk: <0.5 times avg. (ref 0.0–1.0)
Free Thyroxine Index: 2.8 (ref 1.2–4.9)
GFR calc non Af Amer: 88 mL/min/{1.73_m2} (ref >59)
GFR, EST AFRICAN AMERICAN: 102 mL/min/{1.73_m2} (ref >59)
GGT: 57 IU/L (ref 0–60)
GLOBULIN, TOTAL: 1.6 g/dL (ref 1.5–4.5)
GLUCOSE: 95 mg/dL (ref 65–99)
HDL: 56 mg/dL (ref >39)
HEMOGLOBIN: 11.1 g/dL (ref 11.1–15.9)
Hematocrit: 34 % (ref 34.0–46.6)
IMMATURE GRANS (ABS): 0 10*3/uL (ref 0.0–0.1)
IMMATURE GRANULOCYTES: 0 %
IRON: 80 ug/dL (ref 27–139)
LDH: 296 IU/L — AB (ref 119–226)
LDL Calculated: 85 mg/dL (ref 0–99)
LYMPHS: 22 %
Lymphocytes Absolute: 1.6 10*3/uL (ref 0.7–3.1)
MCH: 31.9 pg (ref 26.6–33.0)
MCHC: 32.6 g/dL (ref 31.5–35.7)
MCV: 98 fL — ABNORMAL HIGH (ref 79–97)
MONOCYTES: 5 %
Monocytes Absolute: 0.4 10*3/uL (ref 0.1–0.9)
Neutrophils Absolute: 5.1 10*3/uL (ref 1.4–7.0)
Neutrophils: 70 %
PHOSPHORUS: 4.5 mg/dL (ref 2.5–4.5)
PLATELETS: 262 10*3/uL (ref 150–379)
Potassium: 4.5 mmol/L (ref 3.5–5.2)
RBC: 3.48 x10E6/uL — AB (ref 3.77–5.28)
RDW: 14.2 % (ref 12.3–15.4)
Sodium: 141 mmol/L (ref 134–144)
T3 UPTAKE RATIO: 31 % (ref 24–39)
T4 TOTAL: 9.1 ug/dL (ref 4.5–12.0)
TRIGLYCERIDES: 91 mg/dL (ref 0–149)
TSH: 1.84 u[IU]/mL (ref 0.450–4.500)
Total Protein: 6 g/dL (ref 6.0–8.5)
Uric Acid: 4.7 mg/dL (ref 2.5–7.1)
VLDL CHOLESTEROL CAL: 18 mg/dL (ref 5–40)
WBC: 7.4 10*3/uL (ref 3.4–10.8)

## 2015-05-25 LAB — HGB A1C W/O EAG: Hgb A1c MFr Bld: 4.4 % — ABNORMAL LOW (ref 4.8–5.6)

## 2015-07-11 ENCOUNTER — Ambulatory Visit: Payer: No Typology Code available for payment source | Admitting: Licensed Clinical Social Worker

## 2015-07-25 ENCOUNTER — Ambulatory Visit (INDEPENDENT_AMBULATORY_CARE_PROVIDER_SITE_OTHER): Payer: No Typology Code available for payment source | Admitting: Licensed Clinical Social Worker

## 2015-07-25 DIAGNOSIS — F419 Anxiety disorder, unspecified: Secondary | ICD-10-CM | POA: Diagnosis not present

## 2015-09-01 ENCOUNTER — Other Ambulatory Visit: Payer: Self-pay

## 2015-09-01 DIAGNOSIS — Z1231 Encounter for screening mammogram for malignant neoplasm of breast: Secondary | ICD-10-CM

## 2015-09-19 ENCOUNTER — Ambulatory Visit: Payer: Self-pay

## 2015-09-26 ENCOUNTER — Ambulatory Visit
Admission: RE | Admit: 2015-09-26 | Discharge: 2015-09-26 | Disposition: A | Discharge status: Home / Self Care | Payer: PRIVATE HEALTH INSURANCE | Source: Ambulatory Visit

## 2015-09-26 ENCOUNTER — Ambulatory Visit (INDEPENDENT_AMBULATORY_CARE_PROVIDER_SITE_OTHER): Payer: No Typology Code available for payment source | Admitting: Licensed Clinical Social Worker

## 2015-09-26 DIAGNOSIS — F419 Anxiety disorder, unspecified: Secondary | ICD-10-CM | POA: Diagnosis not present

## 2015-09-26 DIAGNOSIS — Z1231 Encounter for screening mammogram for malignant neoplasm of breast: Secondary | ICD-10-CM

## 2015-10-24 ENCOUNTER — Ambulatory Visit (INDEPENDENT_AMBULATORY_CARE_PROVIDER_SITE_OTHER): Payer: No Typology Code available for payment source | Admitting: Licensed Clinical Social Worker

## 2015-10-24 DIAGNOSIS — F419 Anxiety disorder, unspecified: Secondary | ICD-10-CM | POA: Diagnosis not present

## 2015-11-21 ENCOUNTER — Ambulatory Visit (INDEPENDENT_AMBULATORY_CARE_PROVIDER_SITE_OTHER): Payer: No Typology Code available for payment source | Admitting: Licensed Clinical Social Worker

## 2015-11-21 DIAGNOSIS — F419 Anxiety disorder, unspecified: Secondary | ICD-10-CM

## 2015-12-26 ENCOUNTER — Other Ambulatory Visit: Payer: Self-pay | Admitting: Family Medicine

## 2015-12-26 ENCOUNTER — Other Ambulatory Visit (HOSPITAL_COMMUNITY)
Admission: RE | Admit: 2015-12-26 | Discharge: 2015-12-26 | Disposition: A | Discharge status: Home / Self Care | Payer: No Typology Code available for payment source | Source: Ambulatory Visit | Attending: Family Medicine | Admitting: Family Medicine

## 2015-12-26 DIAGNOSIS — Z124 Encounter for screening for malignant neoplasm of cervix: Secondary | ICD-10-CM | POA: Diagnosis present

## 2015-12-27 LAB — CYTOLOGY - PAP

## 2016-01-09 ENCOUNTER — Ambulatory Visit (INDEPENDENT_AMBULATORY_CARE_PROVIDER_SITE_OTHER): Payer: No Typology Code available for payment source | Admitting: Licensed Clinical Social Worker

## 2016-01-09 DIAGNOSIS — F419 Anxiety disorder, unspecified: Secondary | ICD-10-CM | POA: Diagnosis not present

## 2016-03-13 ENCOUNTER — Other Ambulatory Visit: Payer: Self-pay | Admitting: Family Medicine

## 2016-03-14 LAB — CMP12+LP+TP+TSH+6AC+CBC/D/PLT
ALT: 10 IU/L (ref 0–32)
AST: 31 IU/L (ref 0–40)
Albumin/Globulin Ratio: 2.4 — ABNORMAL HIGH (ref 1.2–2.2)
Albumin: 4.4 g/dL (ref 3.5–4.8)
Alkaline Phosphatase: 77 IU/L (ref 39–117)
BASOS: 2 %
BILIRUBIN TOTAL: 1.4 mg/dL — AB (ref 0.0–1.2)
BUN / CREAT RATIO: 22 (ref 12–28)
BUN: 17 mg/dL (ref 8–27)
Basophils Absolute: 0.1 10*3/uL (ref 0.0–0.2)
CHLORIDE: 102 mmol/L (ref 96–106)
CHOL/HDL RATIO: 2.6 ratio (ref 0.0–4.4)
CREATININE: 0.78 mg/dL (ref 0.57–1.00)
Calcium: 9.6 mg/dL (ref 8.7–10.3)
Cholesterol, Total: 145 mg/dL (ref 100–199)
EOS (ABSOLUTE): 0.2 10*3/uL (ref 0.0–0.4)
EOS: 3 %
Free Thyroxine Index: 2.3 (ref 1.2–4.9)
GFR, EST AFRICAN AMERICAN: 88 mL/min/{1.73_m2} (ref >59)
GFR, EST NON AFRICAN AMERICAN: 77 mL/min/{1.73_m2} (ref >59)
GGT: 22 IU/L (ref 0–60)
GLUCOSE: 87 mg/dL (ref 65–99)
Globulin, Total: 1.8 g/dL (ref 1.5–4.5)
HDL: 55 mg/dL (ref >39)
HEMATOCRIT: 25.4 % — AB (ref 34.0–46.6)
HEMOGLOBIN: 8 g/dL — AB (ref 11.1–15.9)
IRON: 88 ug/dL (ref 27–139)
Immature Grans (Abs): 0 10*3/uL (ref 0.0–0.1)
Immature Granulocytes: 0 %
LDH: 380 IU/L — AB (ref 119–226)
LDL CALC: 69 mg/dL (ref 0–99)
LYMPHS ABS: 1.4 10*3/uL (ref 0.7–3.1)
Lymphs: 20 %
MCH: 35.9 pg — AB (ref 26.6–33.0)
MCHC: 31.5 g/dL (ref 31.5–35.7)
MCV: 114 fL — AB (ref 79–97)
MONOCYTES: 3 %
Monocytes Absolute: 0.2 10*3/uL (ref 0.1–0.9)
NEUTROS ABS: 5.1 10*3/uL (ref 1.4–7.0)
Neutrophils: 72 %
PHOSPHORUS: 4.9 mg/dL — AB (ref 2.5–4.5)
Platelets: 251 10*3/uL (ref 150–379)
Potassium: 3.8 mmol/L (ref 3.5–5.2)
RBC: 2.23 x10E6/uL — CL (ref 3.77–5.28)
RDW: 16.6 % — ABNORMAL HIGH (ref 12.3–15.4)
SODIUM: 145 mmol/L — AB (ref 134–144)
T3 UPTAKE RATIO: 31 % (ref 24–39)
T4, Total: 7.5 ug/dL (ref 4.5–12.0)
TOTAL PROTEIN: 6.2 g/dL (ref 6.0–8.5)
TSH: 3.03 u[IU]/mL (ref 0.450–4.500)
Triglycerides: 103 mg/dL (ref 0–149)
URIC ACID: 6.3 mg/dL (ref 2.5–7.1)
VLDL CHOLESTEROL CAL: 21 mg/dL (ref 5–40)
WBC: 7 10*3/uL (ref 3.4–10.8)

## 2016-03-14 LAB — HGB A1C W/O EAG: Hgb A1c MFr Bld: <4.2 % — ABNORMAL LOW (ref 4.8–5.6)

## 2016-03-16 ENCOUNTER — Other Ambulatory Visit: Payer: Self-pay | Admitting: Family Medicine

## 2016-03-16 ENCOUNTER — Ambulatory Visit
Admission: RE | Admit: 2016-03-16 | Discharge: 2016-03-16 | Disposition: A | Discharge status: Home / Self Care | Payer: No Typology Code available for payment source | Source: Ambulatory Visit | Attending: Family Medicine | Admitting: Family Medicine

## 2016-03-16 DIAGNOSIS — R1011 Right upper quadrant pain: Secondary | ICD-10-CM

## 2016-03-16 MED ORDER — IOPAMIDOL (ISOVUE-300) INJECTION 61%
100.0000 mL | Freq: Once | INTRAVENOUS | Status: AC | PRN
  Administered 2016-03-16: 100 mL via INTRAVENOUS

## 2016-03-19 ENCOUNTER — Ambulatory Visit (HOSPITAL_BASED_OUTPATIENT_CLINIC_OR_DEPARTMENT_OTHER): Payer: No Typology Code available for payment source | Admitting: Oncology

## 2016-03-19 ENCOUNTER — Ambulatory Visit: Payer: No Typology Code available for payment source | Admitting: Licensed Clinical Social Worker

## 2016-03-19 ENCOUNTER — Ambulatory Visit (HOSPITAL_BASED_OUTPATIENT_CLINIC_OR_DEPARTMENT_OTHER): Payer: No Typology Code available for payment source

## 2016-03-19 ENCOUNTER — Other Ambulatory Visit: Payer: Self-pay | Admitting: *Deleted

## 2016-03-19 VITALS — BP 97/70 | HR 95 | Temp 99.0°F | Resp 19 | Ht 64.0 in | Wt 180.4 lb

## 2016-03-19 DIAGNOSIS — Z800 Family history of malignant neoplasm of digestive organs: Secondary | ICD-10-CM | POA: Diagnosis not present

## 2016-03-19 DIAGNOSIS — D539 Nutritional anemia, unspecified: Secondary | ICD-10-CM

## 2016-03-19 DIAGNOSIS — R161 Splenomegaly, not elsewhere classified: Secondary | ICD-10-CM | POA: Diagnosis not present

## 2016-03-19 DIAGNOSIS — D599 Acquired hemolytic anemia, unspecified: Secondary | ICD-10-CM

## 2016-03-19 DIAGNOSIS — D649 Anemia, unspecified: Secondary | ICD-10-CM

## 2016-03-19 DIAGNOSIS — R0609 Other forms of dyspnea: Secondary | ICD-10-CM | POA: Diagnosis not present

## 2016-03-19 DIAGNOSIS — D589 Hereditary hemolytic anemia, unspecified: Secondary | ICD-10-CM | POA: Insufficient documentation

## 2016-03-19 LAB — CHCC SMEAR

## 2016-03-19 LAB — CBC & DIFF AND RETIC
BASO%: 0.8 % (ref 0.0–2.0)
Basophils Absolute: 0.1 10e3/uL (ref 0.0–0.1)
EOS%: 1.7 % (ref 0.0–7.0)
Eosinophils Absolute: 0.1 10e3/uL (ref 0.0–0.5)
HCT: 25.7 % — ABNORMAL LOW (ref 34.8–46.6)
HGB: 8 g/dL — ABNORMAL LOW (ref 11.6–15.9)
Immature Retic Fract: 14.6 % — ABNORMAL HIGH (ref 1.60–10.00)
LYMPH%: 23.3 % (ref 14.0–49.7)
MCH: 34.6 pg — ABNORMAL HIGH (ref 25.1–34.0)
MCHC: 31.1 g/dL — ABNORMAL LOW (ref 31.5–36.0)
MCV: 111.3 fL — ABNORMAL HIGH (ref 79.5–101.0)
MONO#: 0.3 10e3/uL (ref 0.1–0.9)
MONO%: 3.9 % (ref 0.0–14.0)
NEUT#: 4.7 10e3/uL (ref 1.5–6.5)
NEUT%: 70.3 % (ref 38.4–76.8)
Platelets: 252 10e3/uL (ref 145–400)
RBC: 2.31 10e6/uL — ABNORMAL LOW (ref 3.70–5.45)
RDW: 16.6 % — ABNORMAL HIGH (ref 11.2–14.5)
Retic %: 12.84 % — ABNORMAL HIGH (ref 0.70–2.10)
Retic Ct Abs: 296.6 10e3/uL — ABNORMAL HIGH (ref 33.70–90.70)
WBC: 6.7 10e3/uL (ref 3.9–10.3)
lymph#: 1.6 10e3/uL (ref 0.9–3.3)

## 2016-03-19 LAB — DRAW EXTRA CLOT TUBE

## 2016-03-19 NOTE — Progress Notes (Signed)
[facility] New Patient Consult   Referring MD: Cari Caraway, [place], [place] [id number]   [place] Fluellen 70 y.o.  26-Dec-1945    Reason for Referral: Anemia   HPI: Ms. Romanek reports having a routine laboratory evaluation at her workplace on 03/13/2016. A CBC was remarkable for a hemoglobin of 8.0, MCV 114, white count 7.0, platelets 251,000, and an absolute neutrophil count of 5.1. A chemistry panel the same day returned with a creatinine of 0.78, bilirubin 1.4, LDH 380, AST 31, total protein 6.2, and albumin 4.4.  She was referred to Dr. Leonides Schanz on 03/16/2016. A repeat CBC found the hemoglobin 8.4 with MCV of 107. The ferritin returned at 602, serum folate greater than 20, vitamin B-12 561. She was taken off of HCTZ and is referred for evaluation of anemia. She reports right posterior chest discomfort after a fall approximate 5 weeks ago at work.  Dr. Leonides Schanz obtained a CT of the abdomen and pelvis on 03/08/2016. The spleen measured 14.4 cm. This is enlarged compared to a CT from December 2008. Subtle sclerosis in the right eighth rib consistent with a prior fracture.  Past Medical History:  Diagnosis Date  . Anxiety   . Depression   . Temporal arteritis    . Gouty arthropathy   . Hyperlipidemia   . Hypertension   . Migraines   . PolymyalgiaRheumatica      .  G1 P1  Past Surgical History:  Procedure Laterality Date  . BARTHOLIN GLAND CYST EXCISION     non ca  . EYE MUSCLE SURGERY     as a child 49 yrs old  . KNEE SURGERY  2004   rt knee  .     Marland Kitchen TONSILLECTOMY     as a child    Medications: Reviewed  Allergies:  Allergies  Allergen Reactions  . Penicillins Anaphylaxis  . Ceftin [Cefuroxime Axetil]   . Ciprofloxacin   . Darvocet [Propoxyphene N-Acetaminophen]   . Levaquin [Levofloxacin In D5w] Hives  . Sulfa Antibiotics Hives  . Zyrtec [Cetirizine Hcl]     headache    Family history: A maternal uncle had  colon cancer. She believes an aunt had a splenectomy.  Social History:   She lives in [place]. 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 She has a glass of wine or a beer daily. No transfusion history. No risk factor for HIV or hepatitis.     ROS:   Positives include: Right posterior chest discomfort following a fall, 10 pound weight loss over the past 6 weeks, anorexia when she had right-sided pain immediately after the fall-improved, exertional dyspnea, episode of pruritus last night-resolved  A complete ROS was otherwise negative.  Physical Exam:  Blood pressure 97/70, pulse 95, temperature 99 F (37.2 C), temperature source Oral, resp. rate 19, height 5\' 4"  (1.626 m), weight 180 lb 6.4 oz (81.8 kg), SpO2 98 %.  HEENT: Oropharynx without visible mass, neck without mass Lungs: Clear bilaterally Cardiac: Regular rate and rhythm Abdomen: No hepatosplenomegaly, no mass, nontender  Vascular: No leg edema Lymph nodes: No cervical, supraclavicular, axillary, or inguinal nodes Neurologic: Alert and oriented, the motor exam appears intact in the upper and lower extremities Skin: No rash Musculoskeletal: Synovial hypertrophy at the DIP and PIP joints of the hands, no erythema or swelling, no spine tenderness   LAB:  CBC  Lab Results  Component Value Date   WBC 6.7 03/19/2016  HGB 8.0 (L) 03/19/2016   HCT 25.7 (L) 03/19/2016   MCV 111.3 (H) 03/19/2016   PLT 252 03/19/2016   NEUTROABS 4.7 03/19/2016    Blood smear-the white cell morphology is unremarkable. The Platelets appear normal in number. The polychromasia is not increased. There is basophilic stippling. A few teardrops. Rare spherocyte.No fragmented cells, no nucleated red cells. CMP      Component Value Date/Time   NA 145 (H) 03/13/2016 0945   K 3.8 03/13/2016 0945   CL 102 03/13/2016 0945   GLUCOSE 87 03/13/2016 0945   BUN 17 03/13/2016 0945   CREATININE 0.78 03/13/2016 0945   CALCIUM 9.6  03/13/2016 0945   PROT 6.2 03/13/2016 0945   ALBUMIN 4.4 03/13/2016 0945   AST 31 03/13/2016 0945   ALT 10 03/13/2016 0945   ALKPHOS 77 03/13/2016 0945   BILITOT 1.4 (H) 03/13/2016 0945   GFRNONAA 77 03/13/2016 0945   GFRAA 88 03/13/2016 0945   CBC 05/24/2015-hemoglobin 11.1, MCV 98 Imaging:  As per history of present illness-CT abdomen/pelvis from 03/16/2016-images reviewed   Assessment/Plan:   1. Macrocytic anemia  2.   Exertional dyspnea secondary to #1  3.   Mild splenomegaly on CT 03/16/2016  4.   History of polymyalgia rheumatica  5.   History of giant cell/temporal arteritis-maintained on methotrexate in the remote past, now on low dose prednisone   Disposition:   Ms. Khalili is referred for evaluation of macrocytic anemia. She has no previous history of anemia, though a CBC on 05/24/2015 revealed mild macrocytic anemia.  She is mounting a significant reticulocytosis. The elevated LDH and bilirubin are consistent with a diagnosis of hemolytic anemia.  She most likely has autoimmune hemolytic anemia. We checked a direct anti-globulin test, haptoglobin, and fractionated bilirubin today.  If a diagnosis of Coombs positive hemolytic anemia is confirmed the plan is to begin a trial of high-dose prednisone.  We will initiate additional diagnostic evaluation to include  PNH and heavy metal screens if the autoimmune evaluation is negative.  There is no clinical evidence of an underlying lymphoproliferative disorder associated with hemolytic anemia.  She is scheduled for a red cell transfusion on 03/20/2016. We reviewed the risk associated with a red cell transfusion including the chance of an allergic reaction and viral transmission. She agrees to proceed.  Ms. Norbut will return for an office visit and CBC on 03/23/2016.  Approximately 50 minutes were spent with the patient today. The majority of the time was used for counseling and coordination of  care.  Betsy Coder, MD  03/19/2016, 5:30 PM

## 2016-03-20 ENCOUNTER — Ambulatory Visit (HOSPITAL_COMMUNITY)
Admission: RE | Admit: 2016-03-20 | Discharge: 2016-03-20 | Disposition: A | Discharge status: Home / Self Care | Payer: No Typology Code available for payment source | Source: Ambulatory Visit | Attending: Oncology | Admitting: Oncology

## 2016-03-20 ENCOUNTER — Telehealth: Payer: Self-pay | Admitting: Oncology

## 2016-03-20 ENCOUNTER — Telehealth: Payer: Self-pay | Admitting: *Deleted

## 2016-03-20 ENCOUNTER — Ambulatory Visit: Payer: No Typology Code available for payment source

## 2016-03-20 ENCOUNTER — Other Ambulatory Visit: Payer: Self-pay | Admitting: Oncology

## 2016-03-20 ENCOUNTER — Ambulatory Visit: Payer: Self-pay

## 2016-03-20 ENCOUNTER — Ambulatory Visit: Payer: Self-pay | Admitting: Oncology

## 2016-03-20 DIAGNOSIS — D591 Autoimmune hemolytic anemia, unspecified: Secondary | ICD-10-CM

## 2016-03-20 DIAGNOSIS — D649 Anemia, unspecified: Secondary | ICD-10-CM | POA: Diagnosis present

## 2016-03-20 LAB — COMPREHENSIVE METABOLIC PANEL
ALT: 10 U/L (ref 0–55)
ANION GAP: 10 meq/L (ref 3–11)
AST: 27 U/L (ref 5–34)
Albumin: 4.1 g/dL (ref 3.5–5.0)
Alkaline Phosphatase: 70 U/L (ref 40–150)
BUN: 17.7 mg/dL (ref 7.0–26.0)
CALCIUM: 9 mg/dL (ref 8.4–10.4)
CHLORIDE: 108 meq/L (ref 98–109)
CO2: 24 meq/L (ref 22–29)
Creatinine: 0.7 mg/dL (ref 0.6–1.1)
EGFR: 81 mL/min/{1.73_m2} — AB (ref >90)
Glucose: 93 mg/dl (ref 70–140)
POTASSIUM: 4.5 meq/L (ref 3.5–5.1)
Sodium: 142 mEq/L (ref 136–145)
Total Bilirubin: 1.99 mg/dL — ABNORMAL HIGH (ref 0.20–1.20)
Total Protein: 6.3 g/dL — ABNORMAL LOW (ref 6.4–8.3)

## 2016-03-20 LAB — TYPE AND SCREEN
ABO/RH(D): O POS
ANTIBODY SCREEN: POSITIVE
DAT, IGG: POSITIVE
Unit division: 0

## 2016-03-20 LAB — BILIRUBIN, FRACTIONATED(TOT/DIR/INDIR)
BILIRUBIN INDIRECT: 1.26 mg/dL — AB (ref 0.10–0.80)
BILIRUBIN TOTAL: 1.7 mg/dL — AB (ref 0.0–1.2)
BILIRUBIN, DIRECT: 0.44 mg/dL — AB (ref 0.00–0.40)

## 2016-03-20 LAB — DIRECT ANTIGLOBULIN TEST (NOT AT ARMC): Coombs', Direct: POSITIVE — AB

## 2016-03-20 LAB — HAPTOGLOBIN: Haptoglobin: <10 mg/dL — ABNORMAL LOW (ref 34–200)

## 2016-03-20 LAB — LACTATE DEHYDROGENASE: LDH: 379 U/L — AB (ref 125–245)

## 2016-03-20 LAB — PREPARE RBC (CROSSMATCH)

## 2016-03-20 MED ORDER — FOLIC ACID 1 MG PO TABS: 1.0000 mg | ORAL_TABLET | Freq: Every day | ORAL | 1 refills | Status: DC

## 2016-03-20 MED ORDER — PREDNISONE 20 MG PO TABS: 60.0000 mg | ORAL_TABLET | Freq: Every day | ORAL | 0 refills | Status: DC

## 2016-03-20 NOTE — Telephone Encounter (Signed)
Spoke with pt in lobby, informed her that Dr. Benay Spice recommends holding off on transfusion for now, prednisone should make her feel better.  Pt agreed.  She reports she has already taken her Prednisone 5 mg dose. Instructed her to proceed with 60 mg dose today. Hold 5 mg dose going forward. She voiced understanding. She reports she does not have folic acid at home, requests refill to be sent to pharmacy. Same done, per Dr.Sherrill.

## 2016-03-20 NOTE — Telephone Encounter (Signed)
cld pt and left message of appt time for today @1  for lab 8/2@12 :30 8/4@8 

## 2016-03-20 NOTE — Telephone Encounter (Signed)
Called pt to inform her of lab results. Anemia is autoimmune, per Dr. Benay Spice: Begin Prednisone 60mg  daily. May not need transfusion. Keep appts 8/4 for lab and office.  Pt would like transfusion as soon as possible, does not want to wait until Friday. Informed pt that blood bank was unable to crossmatch blood, will need additional samples. Keep appt for lab today, transfusion rescheduled to 8/2. Pt voiced understanding.

## 2016-03-22 ENCOUNTER — Telehealth: Payer: Self-pay | Admitting: *Deleted

## 2016-03-22 DIAGNOSIS — D599 Acquired hemolytic anemia, unspecified: Secondary | ICD-10-CM

## 2016-03-22 NOTE — Telephone Encounter (Signed)
Called to follow up after starting prednisone. She reports she feels "a little better." Denies dyspnea or chest pain. Informed her that blood bank has not yet been able to cross-match her blood. They will need 4 additional tubes to do so.   Discussed with Dr. Benay Spice: Will not likely need transfusion this week, but will need to work pt up for transfusion. Informed pt we will send additional tubes to blood bank when she is here on 8/4.

## 2016-03-23 ENCOUNTER — Telehealth: Payer: Self-pay | Admitting: *Deleted

## 2016-03-23 ENCOUNTER — Ambulatory Visit (HOSPITAL_BASED_OUTPATIENT_CLINIC_OR_DEPARTMENT_OTHER): Payer: No Typology Code available for payment source | Admitting: Oncology

## 2016-03-23 ENCOUNTER — Telehealth: Payer: Self-pay | Admitting: Oncology

## 2016-03-23 ENCOUNTER — Other Ambulatory Visit (HOSPITAL_BASED_OUTPATIENT_CLINIC_OR_DEPARTMENT_OTHER): Payer: Medicare Other | Admitting: *Deleted

## 2016-03-23 ENCOUNTER — Other Ambulatory Visit (HOSPITAL_BASED_OUTPATIENT_CLINIC_OR_DEPARTMENT_OTHER): Payer: No Typology Code available for payment source

## 2016-03-23 VITALS — BP 123/67 | HR 74 | Temp 98.4°F | Resp 18 | Ht 64.0 in | Wt 181.1 lb

## 2016-03-23 DIAGNOSIS — D599 Acquired hemolytic anemia, unspecified: Secondary | ICD-10-CM | POA: Diagnosis not present

## 2016-03-23 DIAGNOSIS — R531 Weakness: Secondary | ICD-10-CM | POA: Diagnosis not present

## 2016-03-23 DIAGNOSIS — R161 Splenomegaly, not elsewhere classified: Secondary | ICD-10-CM

## 2016-03-23 DIAGNOSIS — D649 Anemia, unspecified: Secondary | ICD-10-CM

## 2016-03-23 LAB — CBC WITH DIFFERENTIAL/PLATELET
BASO%: 0.9 % (ref 0.0–2.0)
Basophils Absolute: 0.1 10*3/uL (ref 0.0–0.1)
EOS ABS: 0 10*3/uL (ref 0.0–0.5)
EOS%: 0.3 % (ref 0.0–7.0)
HEMATOCRIT: 28.1 % — AB (ref 34.8–46.6)
HEMOGLOBIN: 8.9 g/dL — AB (ref 11.6–15.9)
LYMPH#: 1.8 10*3/uL (ref 0.9–3.3)
LYMPH%: 19.3 % (ref 14.0–49.7)
MCH: 34.2 pg — ABNORMAL HIGH (ref 25.1–34.0)
MCHC: 31.7 g/dL (ref 31.5–36.0)
MCV: 108 fL — ABNORMAL HIGH (ref 79.5–101.0)
MONO#: 0.3 10*3/uL (ref 0.1–0.9)
MONO%: 3.5 % (ref 0.0–14.0)
NEUT%: 76 % (ref 38.4–76.8)
NEUTROS ABS: 7 10*3/uL — AB (ref 1.5–6.5)
PLATELETS: 338 10*3/uL (ref 145–400)
RBC: 2.6 10*6/uL — AB (ref 3.70–5.45)
RDW: 16.7 % — AB (ref 11.2–14.5)
WBC: 9.2 10*3/uL (ref 3.9–10.3)

## 2016-03-23 NOTE — Telephone Encounter (Signed)
Per staff message and POF I have scheduled appts. Advised scheduler of appts. JMW  

## 2016-03-23 NOTE — Telephone Encounter (Signed)
per pof to chd pt appt-gave pt copy of sch

## 2016-03-23 NOTE — Progress Notes (Signed)
  [facility] OFFICE PROGRESS NOTE   Diagnosis: Hemolytic anemia  INTERVAL HISTORY:   Ms. Hotchkiss returns as scheduled. She was started on prednisone after the hemolysis workup returned positive for an auto antibody. She began prednisone on 03/20/2016. She continues to feel weak.  Objective:  Vital signs in last 24 hours:  Blood pressure (!) 125/48, pulse 81, temperature 98.4 F (36.9 C), temperature source Oral, resp. rate 18, height 5\' 4"  (1.626 m), weight 181 lb 1.6 oz (82.1 kg).    HEENT: No thrush Resp: Lungs clear bilaterally Cardio: Regular rate and rhythm GI: No hepatosplenomegaly Vascular: No leg edema  Lab Results:  Lab Results  Component Value Date   WBC 9.2 03/23/2016   HGB 8.9 (L) 03/23/2016   HCT 28.1 (L) 03/23/2016   MCV 108.0 (H) 03/23/2016   PLT 338 03/23/2016   NEUTROABS 7.0 (H) 03/23/2016    Medications: I have reviewed the patient's current medications.  Assessment/Plan: 1. 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, DAT IgG positive  Prednisone started 03/20/2016  2.   Exertional dyspnea secondary to #1  3.   Mild splenomegaly on CT 03/16/2016  4.   History of polymyalgia rheumatica  5.   History of giant cell/temporal arteritis-maintained on methotrexate in the remote past, now on low dose prednisone   Disposition:  She has been diagnosed with autoimmune hemolytic anemia. The hemoglobin is slightly improved today. We decided to hold on a red cell transfusion. She will continue prednisone at a dose of 60 mg daily. We discussed the risk associated with prednisone therapy.  There is no apparent systemic condition associated with the hemolytic anemia in her case. We will check an ANA and rheumatoid factor. No clinical evidence for lymphoma.  She will return for a CBC on 03/29/2016 and an office visit on 04/04/2016. The plan is to begin a prednisone taper when there is clear improvement  in the anemia.  Betsy Coder, MD  03/23/2016  8:47 AM

## 2016-03-23 NOTE — Telephone Encounter (Signed)
Gave pt cal & avs °

## 2016-03-23 NOTE — Telephone Encounter (Signed)
Call from pt reporting blood in urine. She reports she noticed bright red blood in underwear at first, then when she wiped after urinating. Discussed with Dr. Benay Spice: Monitor for now, call for large amounts of bleeding. Will check UA with next lab appt. Noted no lab scheduled for 8/16 visit. Appt added.

## 2016-03-23 NOTE — Telephone Encounter (Signed)
Call from [place] in blood bank, they have phenotypically matched blood for pt. Informed her that there are no plans to transfuse at this time. Will repeat CBC next week.

## 2016-03-26 ENCOUNTER — Telehealth: Payer: Self-pay | Admitting: *Deleted

## 2016-03-26 ENCOUNTER — Telehealth: Payer: Self-pay | Admitting: Oncology

## 2016-03-26 ENCOUNTER — Telehealth: Payer: Self-pay | Admitting: Nurse Practitioner

## 2016-03-26 ENCOUNTER — Other Ambulatory Visit: Payer: Self-pay | Admitting: Nurse Practitioner

## 2016-03-26 DIAGNOSIS — D591 Autoimmune hemolytic anemia, unspecified: Secondary | ICD-10-CM

## 2016-03-26 DIAGNOSIS — R319 Hematuria, unspecified: Secondary | ICD-10-CM

## 2016-03-26 LAB — ANTINUCLEAR ANTIBODIES, IFA: ANTINUCLEAR ANTIBODIES, IFA: NEGATIVE

## 2016-03-26 NOTE — Telephone Encounter (Signed)
spke w/ pt confirmed 8/8 apt time

## 2016-03-26 NOTE — Telephone Encounter (Signed)
I returned Michelle Wilcox's call regarding the left eye floaters. She noted black and orange spots yesterday for about 2 hours. Much better today with no significant floaters noted. She also reports noticing a small amount of blood on the toilet tissue after urinating.   She will contact her eye doctor for an appt regarding the floaters. We will schedule a lab appt 8/8 for a CBC and u/a.

## 2016-03-26 NOTE — Telephone Encounter (Signed)
Per staff message and POF I have scheduled appts. Advised scheduler of appts. JMW  

## 2016-03-27 ENCOUNTER — Encounter: Payer: Self-pay | Admitting: Oncology

## 2016-03-27 ENCOUNTER — Other Ambulatory Visit: Payer: Self-pay | Admitting: *Deleted

## 2016-03-27 ENCOUNTER — Other Ambulatory Visit (HOSPITAL_BASED_OUTPATIENT_CLINIC_OR_DEPARTMENT_OTHER): Payer: No Typology Code available for payment source

## 2016-03-27 ENCOUNTER — Telehealth: Payer: Self-pay | Admitting: *Deleted

## 2016-03-27 DIAGNOSIS — D591 Autoimmune hemolytic anemia, unspecified: Secondary | ICD-10-CM

## 2016-03-27 DIAGNOSIS — D599 Acquired hemolytic anemia, unspecified: Secondary | ICD-10-CM

## 2016-03-27 DIAGNOSIS — R319 Hematuria, unspecified: Secondary | ICD-10-CM

## 2016-03-27 DIAGNOSIS — R599 Enlarged lymph nodes, unspecified: Secondary | ICD-10-CM | POA: Diagnosis not present

## 2016-03-27 LAB — CBC WITH DIFFERENTIAL/PLATELET
BASO%: 1 % (ref 0.0–2.0)
Basophils Absolute: 0.1 10*3/uL (ref 0.0–0.1)
EOS%: 0.9 % (ref 0.0–7.0)
Eosinophils Absolute: 0.1 10*3/uL (ref 0.0–0.5)
HEMATOCRIT: 30.2 % — AB (ref 34.8–46.6)
HGB: 9.9 g/dL — ABNORMAL LOW (ref 11.6–15.9)
LYMPH#: 1.8 10*3/uL (ref 0.9–3.3)
LYMPH%: 17.2 % (ref 14.0–49.7)
MCH: 35.2 pg — ABNORMAL HIGH (ref 25.1–34.0)
MCHC: 32.8 g/dL (ref 31.5–36.0)
MCV: 107.3 fL — ABNORMAL HIGH (ref 79.5–101.0)
MONO#: 0.4 10*3/uL (ref 0.1–0.9)
MONO%: 4.1 % (ref 0.0–14.0)
NEUT%: 76.8 % (ref 38.4–76.8)
NEUTROS ABS: 8 10*3/uL — AB (ref 1.5–6.5)
PLATELETS: 322 10*3/uL (ref 145–400)
RBC: 2.82 10*6/uL — ABNORMAL LOW (ref 3.70–5.45)
RDW: 16.5 % — AB (ref 11.2–14.5)
WBC: 10.4 10*3/uL — AB (ref 3.9–10.3)

## 2016-03-27 LAB — URINALYSIS, MICROSCOPIC - CHCC
BLOOD: NEGATIVE
Bilirubin (Urine): POSITIVE
Glucose: NEGATIVE mg/dL
Ketones: NEGATIVE mg/dL
Nitrite: NEGATIVE
Protein: NEGATIVE mg/dL
SPECIFIC GRAVITY, URINE: 1.025 (ref 1.003–1.035)
Urobilinogen, UR: 0.2 mg/dL (ref 0.2–1)
pH: 6 (ref 4.6–8.0)

## 2016-03-27 MED ORDER — PREDNISONE 20 MG PO TABS: 40.0000 mg | ORAL_TABLET | Freq: Every day | ORAL | 0 refills | Status: DC

## 2016-03-27 NOTE — Progress Notes (Signed)
recd forms in box- left for dr. Benay Spice to sign

## 2016-03-27 NOTE — Telephone Encounter (Signed)
Per Dr. Benay Spice, message left to notify pt that CBC and u/a results are unremarkable from today and to decrease Prednisone dose to 40 mg daily.  Pt instructed to call [place] back with any questions or concerns.

## 2016-03-27 NOTE — Addendum Note (Signed)
Addended by: San Morelle on: 03/27/2016 05:07 PM   Modules accepted: Orders

## 2016-03-28 ENCOUNTER — Other Ambulatory Visit: Payer: Self-pay | Admitting: *Deleted

## 2016-03-28 ENCOUNTER — Telehealth: Payer: Self-pay | Admitting: *Deleted

## 2016-03-28 LAB — RHEUMATOID FACTOR

## 2016-03-28 NOTE — Telephone Encounter (Signed)
Message left from pt this AM inquiring about moving MD appt earlier.  Per Dr. Benay Spice, pt can be seen on Monday, 04/02/16.  Message sent to schedulers and call placed back to pt to inform her of new appt time.  Pt appreciative of call back.

## 2016-03-29 ENCOUNTER — Other Ambulatory Visit: Payer: Self-pay

## 2016-03-29 ENCOUNTER — Encounter: Payer: Self-pay | Admitting: *Deleted

## 2016-03-29 NOTE — Progress Notes (Signed)
Release form brought in today by pt for her medical information to be released to [employer].  Form left for R. Browning.

## 2016-03-30 ENCOUNTER — Telehealth: Payer: Self-pay | Admitting: *Deleted

## 2016-03-30 NOTE — Telephone Encounter (Signed)
Call received from pt confirming appt for Monday.  Pt appreciative of assistance.

## 2016-04-02 ENCOUNTER — Ambulatory Visit (HOSPITAL_BASED_OUTPATIENT_CLINIC_OR_DEPARTMENT_OTHER): Payer: No Typology Code available for payment source | Admitting: Oncology

## 2016-04-02 ENCOUNTER — Telehealth: Payer: Self-pay | Admitting: Oncology

## 2016-04-02 ENCOUNTER — Other Ambulatory Visit (HOSPITAL_BASED_OUTPATIENT_CLINIC_OR_DEPARTMENT_OTHER): Payer: No Typology Code available for payment source

## 2016-04-02 VITALS — BP 130/59 | HR 98 | Temp 98.3°F | Resp 18 | Ht 64.0 in | Wt 184.7 lb

## 2016-04-02 DIAGNOSIS — R161 Splenomegaly, not elsewhere classified: Secondary | ICD-10-CM

## 2016-04-02 DIAGNOSIS — D599 Acquired hemolytic anemia, unspecified: Secondary | ICD-10-CM | POA: Diagnosis not present

## 2016-04-02 DIAGNOSIS — R0609 Other forms of dyspnea: Secondary | ICD-10-CM | POA: Diagnosis not present

## 2016-04-02 LAB — COMPREHENSIVE METABOLIC PANEL
ALT: 9 U/L (ref 0–55)
AST: 14 U/L (ref 5–34)
Albumin: 3.7 g/dL (ref 3.5–5.0)
Alkaline Phosphatase: 59 U/L (ref 40–150)
Anion Gap: 8 mEq/L (ref 3–11)
BILIRUBIN TOTAL: 1.11 mg/dL (ref 0.20–1.20)
BUN: 16.4 mg/dL (ref 7.0–26.0)
CHLORIDE: 105 meq/L (ref 98–109)
CO2: 30 meq/L — AB (ref 22–29)
CREATININE: 0.8 mg/dL (ref 0.6–1.1)
Calcium: 9 mg/dL (ref 8.4–10.4)
EGFR: 70 mL/min/{1.73_m2} — ABNORMAL LOW (ref >90)
GLUCOSE: 93 mg/dL (ref 70–140)
Potassium: 3.8 mEq/L (ref 3.5–5.1)
SODIUM: 143 meq/L (ref 136–145)
TOTAL PROTEIN: 5.7 g/dL — AB (ref 6.4–8.3)

## 2016-04-02 LAB — CBC & DIFF AND RETIC
BASO%: 0.2 % (ref 0.0–2.0)
BASOS ABS: 0 10*3/uL (ref 0.0–0.1)
EOS ABS: 0.2 10*3/uL (ref 0.0–0.5)
EOS%: 1.2 % (ref 0.0–7.0)
HEMATOCRIT: 34 % — AB (ref 34.8–46.6)
HEMOGLOBIN: 10.7 g/dL — AB (ref 11.6–15.9)
IMMATURE RETIC FRACT: 10.1 % — AB (ref 1.60–10.00)
LYMPH%: 16.5 % (ref 14.0–49.7)
MCH: 34.1 pg — AB (ref 25.1–34.0)
MCHC: 31.5 g/dL (ref 31.5–36.0)
MCV: 108.3 fL — ABNORMAL HIGH (ref 79.5–101.0)
MONO#: 0.4 10*3/uL (ref 0.1–0.9)
MONO%: 3.1 % (ref 0.0–14.0)
NEUT%: 79 % — ABNORMAL HIGH (ref 38.4–76.8)
NEUTROS ABS: 9.8 10*3/uL — AB (ref 1.5–6.5)
Platelets: 286 10*3/uL (ref 145–400)
RBC: 3.14 10*6/uL — ABNORMAL LOW (ref 3.70–5.45)
RDW: 14.9 % — AB (ref 11.2–14.5)
RETIC %: 8.57 % — AB (ref 0.70–2.10)
Retic Ct Abs: 269.1 10*3/uL — ABNORMAL HIGH (ref 33.70–90.70)
WBC: 12.4 10*3/uL — AB (ref 3.9–10.3)
lymph#: 2.1 10*3/uL (ref 0.9–3.3)

## 2016-04-02 LAB — LACTATE DEHYDROGENASE: LDH: 243 U/L (ref 125–245)

## 2016-04-02 NOTE — Telephone Encounter (Signed)
Gave patient avs report and appointments for August and september

## 2016-04-02 NOTE — Progress Notes (Signed)
  [facility] OFFICE PROGRESS NOTE   Diagnosis: Hemolytic anemia  INTERVAL HISTORY:   Ms.Orren-Torregrossa returns as scheduled. The hemoglobin was better last week and the prednisone was lowered to 40 mg. She reports insomnia while on prednisone. She had a few episodes of a small amount of hematuria last week. She had one episode of seeing "spiders "in her left I on 03/25/2016. She saw an ophthalmologist and reports being diagnosed with a retinal hemorrhage. She has been referred to a retinal specialist. The eye symptoms have resolved. Her energy level is partially improved, but has not returned to baseline.  Objective:  Vital signs in last 24 hours:  Blood pressure (!) 127/56, pulse 98, temperature 98.3 F (36.8 C), temperature source Oral, resp. rate 18, height 5\' 4"  (1.626 m), weight 184 lb 11.2 oz (83.8 kg), SpO2 98 %.    HEENT: No thrush Resp: Lungs clear bilaterally Cardio: Regular rate and rhythm GI: No hepatosplenomegaly, nontender Vascular: No leg edema   Lab Results:  Lab Results  Component Value Date   WBC 12.4 (H) 04/02/2016   HGB 10.7 (L) 04/02/2016   HCT 34.0 (L) 04/02/2016   MCV 108.3 (H) 04/02/2016   PLT 286 04/02/2016   NEUTROABS 9.8 (H) 04/02/2016   03/23/2016-ANA-negative 03/27/2016-rheumatoid antibody-negative   Medications: I have reviewed the patient's current medications.  Assessment/Plan: 1. 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, DAT IgG positive  Prednisone started 03/20/2016  2. Exertional dyspnea secondary to #1  3. Mild splenomegaly on CT 03/16/2016  4. History of polymyalgia rheumatica  5. History of giant cell/temporal arteritis-maintained on methotrexate in the remote past, now on low dose prednisone    Disposition:  She has been diagnosed with idiopathic autoimmune hemolytic anemia. The hemolysis is responding to prednisone. She will begin prednisone at a dose of 30  mg on 04/03/2016. She will return for a CBC in 2 weeks and an office visit in 4 weeks. The plan is to slowly taper the prednisone over approximately 3 months.  She will follow-up with Dr. Leonides Schanz for recurrent hematuria. I cannot relate this to the hemolytic anemia diagnosis.  Betsy Coder, MD  04/02/2016  10:00 AM

## 2016-04-04 ENCOUNTER — Encounter: Payer: Self-pay | Admitting: Oncology

## 2016-04-04 ENCOUNTER — Other Ambulatory Visit: Payer: Self-pay

## 2016-04-04 ENCOUNTER — Ambulatory Visit: Payer: Self-pay | Admitting: Oncology

## 2016-04-04 ENCOUNTER — Ambulatory Visit: Payer: Self-pay | Admitting: Nurse Practitioner

## 2016-04-04 NOTE — Progress Notes (Signed)
form left in box 03/30/16

## 2016-04-05 ENCOUNTER — Encounter: Payer: Self-pay | Admitting: Oncology

## 2016-04-05 NOTE — Progress Notes (Signed)
form left in box 03/30/16- lef for dr Benay Spice to sign

## 2016-04-06 ENCOUNTER — Encounter: Payer: Self-pay | Admitting: Oncology

## 2016-04-06 NOTE — Progress Notes (Signed)
form left in box 03/30/16- lef for dr Benay Spice to sign- faxed [employer]  [phone]-sent to medical recrds copy mailed to pateint

## 2016-04-16 ENCOUNTER — Other Ambulatory Visit (HOSPITAL_BASED_OUTPATIENT_CLINIC_OR_DEPARTMENT_OTHER): Payer: No Typology Code available for payment source

## 2016-04-16 DIAGNOSIS — R599 Enlarged lymph nodes, unspecified: Secondary | ICD-10-CM

## 2016-04-16 DIAGNOSIS — D599 Acquired hemolytic anemia, unspecified: Secondary | ICD-10-CM

## 2016-04-16 LAB — CBC WITH DIFFERENTIAL/PLATELET
BASO%: 0.3 % (ref 0.0–2.0)
BASOS ABS: 0 10*3/uL (ref 0.0–0.1)
EOS ABS: 0.1 10*3/uL (ref 0.0–0.5)
EOS%: 0.6 % (ref 0.0–7.0)
HCT: 36.5 % (ref 34.8–46.6)
HEMOGLOBIN: 11.8 g/dL (ref 11.6–15.9)
LYMPH%: 12.5 % — AB (ref 14.0–49.7)
MCH: 33.1 pg (ref 25.1–34.0)
MCHC: 32.3 g/dL (ref 31.5–36.0)
MCV: 102.2 fL — AB (ref 79.5–101.0)
MONO#: 0.3 10*3/uL (ref 0.1–0.9)
MONO%: 1.9 % (ref 0.0–14.0)
NEUT#: 11.8 10*3/uL — ABNORMAL HIGH (ref 1.5–6.5)
NEUT%: 84.7 % — ABNORMAL HIGH (ref 38.4–76.8)
Platelets: 231 10*3/uL (ref 145–400)
RBC: 3.57 10*6/uL — AB (ref 3.70–5.45)
RDW: 13.8 % (ref 11.2–14.5)
WBC: 13.9 10*3/uL — ABNORMAL HIGH (ref 3.9–10.3)
lymph#: 1.7 10*3/uL (ref 0.9–3.3)

## 2016-04-17 ENCOUNTER — Telehealth: Payer: Self-pay | Admitting: *Deleted

## 2016-04-17 NOTE — Telephone Encounter (Signed)
-----   Message from Ladell Pier, MD sent at 04/16/2016  7:42 PM EDT ----- Please call patient Should be taking prednisone 30mg  daily  Decrease to 20mg  daily, f/u as scheduled

## 2016-04-17 NOTE — Telephone Encounter (Signed)
Pt returned call, instructed her to decrease Prednisone to 20 mg daily. She voiced understanding. Pt reports she has had a few "small" spontaneous nosebleeds. Asking if she should increase folic acid. Fatigue is improving but she doesn't feel well enough to return to work. She is requesting a note stating she will be out until next office visit.   Pt is aware Dr. Benay Spice is out of the office today. Will call her when letter is available.

## 2016-04-19 NOTE — Telephone Encounter (Signed)
Spoke with pt, informed her that out-of-work note is available for pick up. She voiced understanding. Per Dr. Benay Spice: No need to increase folic acid for scant nosebleeds. She voiced understanding.

## 2016-04-24 ENCOUNTER — Telehealth: Payer: Self-pay | Admitting: *Deleted

## 2016-04-24 NOTE — Telephone Encounter (Signed)
Call from Cristy Hilts, RN with [facility] asking for pt's next office visit date. Informed him of next appt, he is handling her short term disability claim. Pt was given a note stating she can return to work on 05/01/16.

## 2016-04-30 ENCOUNTER — Telehealth: Payer: Self-pay | Admitting: Oncology

## 2016-04-30 ENCOUNTER — Ambulatory Visit (HOSPITAL_BASED_OUTPATIENT_CLINIC_OR_DEPARTMENT_OTHER): Payer: No Typology Code available for payment source | Admitting: Oncology

## 2016-04-30 ENCOUNTER — Other Ambulatory Visit (HOSPITAL_BASED_OUTPATIENT_CLINIC_OR_DEPARTMENT_OTHER): Payer: No Typology Code available for payment source

## 2016-04-30 VITALS — BP 118/67 | HR 82 | Temp 99.1°F | Resp 18 | Ht 64.0 in | Wt 185.3 lb

## 2016-04-30 DIAGNOSIS — D599 Acquired hemolytic anemia, unspecified: Secondary | ICD-10-CM

## 2016-04-30 DIAGNOSIS — R040 Epistaxis: Secondary | ICD-10-CM

## 2016-04-30 LAB — CBC & DIFF AND RETIC
BASO%: 0.4 % (ref 0.0–2.0)
Basophils Absolute: 0.1 10*3/uL (ref 0.0–0.1)
EOS%: 0.7 % (ref 0.0–7.0)
Eosinophils Absolute: 0.1 10*3/uL (ref 0.0–0.5)
HCT: 36.7 % (ref 34.8–46.6)
HGB: 11.8 g/dL (ref 11.6–15.9)
Immature Retic Fract: 10.3 % — ABNORMAL HIGH (ref 1.60–10.00)
LYMPH#: 2.4 10*3/uL (ref 0.9–3.3)
LYMPH%: 16.5 % (ref 14.0–49.7)
MCH: 32.2 pg (ref 25.1–34.0)
MCHC: 32.2 g/dL (ref 31.5–36.0)
MCV: 100 fL (ref 79.5–101.0)
MONO#: 0.3 10*3/uL (ref 0.1–0.9)
MONO%: 2.2 % (ref 0.0–14.0)
NEUT%: 80.2 % — AB (ref 38.4–76.8)
NEUTROS ABS: 11.5 10*3/uL — AB (ref 1.5–6.5)
PLATELETS: 277 10*3/uL (ref 145–400)
RBC: 3.67 10*6/uL — AB (ref 3.70–5.45)
RDW: 14 % (ref 11.2–14.5)
Retic %: 6.49 % — ABNORMAL HIGH (ref 0.70–2.10)
Retic Ct Abs: 238.18 10*3/uL — ABNORMAL HIGH (ref 33.70–90.70)
WBC: 14.4 10*3/uL — AB (ref 3.9–10.3)

## 2016-04-30 MED ORDER — PREDNISONE 10 MG PO TABS: 15.0000 mg | ORAL_TABLET | Freq: Every day | ORAL | 0 refills | Status: DC

## 2016-04-30 NOTE — Progress Notes (Signed)
  [facility] OFFICE PROGRESS NOTE   Diagnosis: Autoimmune hemolytic anemia  INTERVAL HISTORY:   She returns as scheduled. She has been maintained on prednisone at a dose of 20 mg daily since 04/17/2016. She reports an improved energy level. She has intermittent bleeding from the left nostril. She reports a history of nosebleeds in the past.  Objective:  Vital signs in last 24 hours:  Blood pressure 118/67, pulse 82, temperature 99.1 F (37.3 C), temperature source Oral, resp. rate 18, height 5\' 4"  (1.626 m), weight 185 lb 4.8 oz (84.1 kg), SpO2 97 %.    HEENT: No thrush, small eschar, question ulcer at the distal left nasal septum-no active bleeding Lymphatics: No cervical or supraclavicular nodes Resp: Lungs with end inspiratory coarse rhonchi at the left posterior base, no respiratory distress Cardio: Regular rate and rhythm GI: No hepatosplenomegaly Vascular: No leg edema  Lab Results:  Lab Results  Component Value Date   WBC 14.4 (H) 04/30/2016   HGB 11.8 04/30/2016   HCT 36.7 04/30/2016   MCV 100.0 04/30/2016   PLT 277 04/30/2016   NEUTROABS 11.5 (H) 04/30/2016   Reticulocyte-238, 6.49% Medications: I have reviewed the patient's current medications.  Assessment/Plan: 1. 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, DAT IgG positive  Prednisone started 03/20/2016  2. Exertional dyspnea secondary to #1  3. Mild splenomegaly on CT 03/16/2016  4. History of polymyalgia rheumatica  5. History of giant cell/temporal arteritis-maintained on methotrexate in the remote past, now on low dose prednisone   Disposition:  The hemolytic has normalized since she started prednisone last month. The hemoglobin is stable today. The prednisone will be tapered to 15 mg daily. She will return for a CBC in 3 weeks and an office visit in 5 weeks. I suspect the left-sided nose bleeding is unrelated to hemolytic anemia. She will  her ENT physician, Dr. Erik Obey for persistent bleeding.  Betsy Coder, MD  04/30/2016  12:16 PM

## 2016-04-30 NOTE — Telephone Encounter (Signed)
Avs report and schd given per 04/30/16 los °

## 2016-04-30 NOTE — Addendum Note (Signed)
Addended by: Rosalio Macadamia C on: 04/30/2016 12:59 PM   Modules accepted: Orders

## 2016-05-17 ENCOUNTER — Other Ambulatory Visit: Payer: Self-pay | Admitting: Oncology

## 2016-05-21 ENCOUNTER — Other Ambulatory Visit (HOSPITAL_BASED_OUTPATIENT_CLINIC_OR_DEPARTMENT_OTHER): Payer: No Typology Code available for payment source

## 2016-05-21 DIAGNOSIS — D599 Acquired hemolytic anemia, unspecified: Secondary | ICD-10-CM | POA: Diagnosis not present

## 2016-05-21 LAB — COMPREHENSIVE METABOLIC PANEL
ALBUMIN: 3.5 g/dL (ref 3.5–5.0)
ALK PHOS: 81 U/L (ref 40–150)
ALT: 13 U/L (ref 0–55)
AST: 22 U/L (ref 5–34)
Anion Gap: 10 mEq/L (ref 3–11)
BUN: 16.8 mg/dL (ref 7.0–26.0)
CALCIUM: 9.1 mg/dL (ref 8.4–10.4)
CHLORIDE: 106 meq/L (ref 98–109)
CO2: 28 mEq/L (ref 22–29)
Creatinine: 0.8 mg/dL (ref 0.6–1.1)
EGFR: 74 mL/min/{1.73_m2} — AB (ref >90)
Glucose: 100 mg/dl (ref 70–140)
POTASSIUM: 4 meq/L (ref 3.5–5.1)
SODIUM: 144 meq/L (ref 136–145)
Total Bilirubin: 0.86 mg/dL (ref 0.20–1.20)
Total Protein: 5.7 g/dL — ABNORMAL LOW (ref 6.4–8.3)

## 2016-05-21 LAB — CBC & DIFF AND RETIC
BASO%: 0.9 % (ref 0.0–2.0)
BASOS ABS: 0.1 10*3/uL (ref 0.0–0.1)
EOS%: 2.1 % (ref 0.0–7.0)
Eosinophils Absolute: 0.2 10*3/uL (ref 0.0–0.5)
HEMATOCRIT: 32.6 % — AB (ref 34.8–46.6)
HGB: 10.3 g/dL — ABNORMAL LOW (ref 11.6–15.9)
IMMATURE RETIC FRACT: 15.2 % — AB (ref 1.60–10.00)
LYMPH#: 2.4 10*3/uL (ref 0.9–3.3)
LYMPH%: 24.2 % (ref 14.0–49.7)
MCH: 31.7 pg (ref 25.1–34.0)
MCHC: 31.6 g/dL (ref 31.5–36.0)
MCV: 100.3 fL (ref 79.5–101.0)
MONO#: 0.5 10*3/uL (ref 0.1–0.9)
MONO%: 5.1 % (ref 0.0–14.0)
NEUT#: 6.7 10*3/uL — ABNORMAL HIGH (ref 1.5–6.5)
NEUT%: 67.7 % (ref 38.4–76.8)
PLATELETS: 267 10*3/uL (ref 145–400)
RBC: 3.25 10*6/uL — AB (ref 3.70–5.45)
RDW: 15.6 % — AB (ref 11.2–14.5)
RETIC %: 8.84 % — AB (ref 0.70–2.10)
RETIC CT ABS: 287.3 10*3/uL — AB (ref 33.70–90.70)
WBC: 9.9 10*3/uL (ref 3.9–10.3)

## 2016-05-21 LAB — LACTATE DEHYDROGENASE: LDH: 292 U/L — AB (ref 125–245)

## 2016-05-22 ENCOUNTER — Telehealth: Payer: Self-pay | Admitting: *Deleted

## 2016-05-22 DIAGNOSIS — H3562 Retinal hemorrhage, left eye: Secondary | ICD-10-CM | POA: Insufficient documentation

## 2016-05-22 DIAGNOSIS — M316 Other giant cell arteritis: Secondary | ICD-10-CM | POA: Insufficient documentation

## 2016-05-22 DIAGNOSIS — IMO0002 Reserved for concepts with insufficient information to code with codable children: Secondary | ICD-10-CM | POA: Insufficient documentation

## 2016-05-22 DIAGNOSIS — D599 Acquired hemolytic anemia, unspecified: Secondary | ICD-10-CM

## 2016-05-22 DIAGNOSIS — H35363 Drusen (degenerative) of macula, bilateral: Secondary | ICD-10-CM | POA: Insufficient documentation

## 2016-05-22 MED ORDER — PREDNISONE 20 MG PO TABS: ORAL_TABLET | ORAL | 0 refills | Status: DC

## 2016-05-22 NOTE — Telephone Encounter (Signed)
Per Dr. Benay Spice, patient notified that hgb is lower and to increase prednisone to 30 mg daily and to f/u as scheduled.  Pt concerned regarding drop in hgb and is appreciative of call.

## 2016-05-22 NOTE — Telephone Encounter (Signed)
-----   Message from Ladell Pier, MD sent at 05/21/2016  7:05 PM EDT ----- Please call patient, hb is lower, increase prednisone to 30mg  daily, f/u as scheduled, cbc,cmet,retic next visit

## 2016-06-04 ENCOUNTER — Telehealth: Payer: Self-pay | Admitting: *Deleted

## 2016-06-04 ENCOUNTER — Other Ambulatory Visit (HOSPITAL_BASED_OUTPATIENT_CLINIC_OR_DEPARTMENT_OTHER): Payer: No Typology Code available for payment source

## 2016-06-04 ENCOUNTER — Telehealth: Payer: Self-pay | Admitting: Oncology

## 2016-06-04 ENCOUNTER — Ambulatory Visit (HOSPITAL_BASED_OUTPATIENT_CLINIC_OR_DEPARTMENT_OTHER): Payer: No Typology Code available for payment source | Admitting: Oncology

## 2016-06-04 VITALS — BP 144/62 | HR 78 | Temp 98.3°F | Resp 18 | Ht 64.0 in | Wt 191.9 lb

## 2016-06-04 DIAGNOSIS — D599 Acquired hemolytic anemia, unspecified: Secondary | ICD-10-CM

## 2016-06-04 DIAGNOSIS — I10 Essential (primary) hypertension: Secondary | ICD-10-CM

## 2016-06-04 DIAGNOSIS — R0609 Other forms of dyspnea: Secondary | ICD-10-CM

## 2016-06-04 DIAGNOSIS — M316 Other giant cell arteritis: Secondary | ICD-10-CM

## 2016-06-04 DIAGNOSIS — Z23 Encounter for immunization: Secondary | ICD-10-CM | POA: Diagnosis not present

## 2016-06-04 LAB — CBC & DIFF AND RETIC
BASO%: 0.2 % (ref 0.0–2.0)
Basophils Absolute: 0 10*3/uL (ref 0.0–0.1)
EOS%: 0.9 % (ref 0.0–7.0)
Eosinophils Absolute: 0.1 10*3/uL (ref 0.0–0.5)
HCT: 36.8 % (ref 34.8–46.6)
HGB: 11.7 g/dL (ref 11.6–15.9)
Immature Retic Fract: 6.1 % (ref 1.60–10.00)
LYMPH%: 12.5 % — ABNORMAL LOW (ref 14.0–49.7)
MCH: 31.5 pg (ref 25.1–34.0)
MCHC: 31.8 g/dL (ref 31.5–36.0)
MCV: 98.9 fL (ref 79.5–101.0)
MONO#: 0.5 10*3/uL (ref 0.1–0.9)
MONO%: 3.1 % (ref 0.0–14.0)
NEUT%: 83.3 % — ABNORMAL HIGH (ref 38.4–76.8)
NEUTROS ABS: 13.4 10*3/uL — AB (ref 1.5–6.5)
NRBC: 0 % (ref 0–0)
PLATELETS: 282 10*3/uL (ref 145–400)
RBC: 3.72 10*6/uL (ref 3.70–5.45)
RDW: 15.4 % — AB (ref 11.2–14.5)
Retic %: 5.81 % — ABNORMAL HIGH (ref 0.70–2.10)
Retic Ct Abs: 216.13 10*3/uL — ABNORMAL HIGH (ref 33.70–90.70)
WBC: 16.1 10*3/uL — AB (ref 3.9–10.3)
lymph#: 2 10*3/uL (ref 0.9–3.3)

## 2016-06-04 LAB — COMPREHENSIVE METABOLIC PANEL
ALBUMIN: 3.7 g/dL (ref 3.5–5.0)
ALK PHOS: 71 U/L (ref 40–150)
ALT: 6 U/L (ref 0–55)
ANION GAP: 10 meq/L (ref 3–11)
AST: 15 U/L (ref 5–34)
BUN: 17.3 mg/dL (ref 7.0–26.0)
CO2: 29 meq/L (ref 22–29)
Calcium: 9.2 mg/dL (ref 8.4–10.4)
Chloride: 105 mEq/L (ref 98–109)
Creatinine: 0.8 mg/dL (ref 0.6–1.1)
EGFR: 74 mL/min/{1.73_m2} — AB (ref >90)
GLUCOSE: 97 mg/dL (ref 70–140)
POTASSIUM: 4.1 meq/L (ref 3.5–5.1)
SODIUM: 144 meq/L (ref 136–145)
Total Bilirubin: 0.76 mg/dL (ref 0.20–1.20)
Total Protein: 5.8 g/dL — ABNORMAL LOW (ref 6.4–8.3)

## 2016-06-04 MED ORDER — INFLUENZA VAC SPLIT QUAD 0.5 ML IM SUSY
0.5000 mL | PREFILLED_SYRINGE | Freq: Once | INTRAMUSCULAR | Status: AC
  Administered 2016-06-04: 0.5 mL via INTRAMUSCULAR
  Filled 2016-06-04: qty 0.5

## 2016-06-04 NOTE — Addendum Note (Signed)
Addended by: San Morelle on: 06/04/2016 10:36 AM   Modules accepted: Orders

## 2016-06-04 NOTE — Telephone Encounter (Signed)
Avs report and appointment schedule given to patient, per 06/04/16 los. °

## 2016-06-04 NOTE — Telephone Encounter (Signed)
Call placed back to patient to review CMET and CBC results with patient.  Patient appreciative of call back and has no further questions at this time.

## 2016-06-04 NOTE — Progress Notes (Signed)
  [facility] OFFICE PROGRESS NOTE   Diagnosis: Autoimmune hemolytic anemia  INTERVAL HISTORY:   She returns as scheduled. The hemoglobin was lower on 05/21/2016. The prednisone dose was increased to 30 mg daily. She has noted mid and diarrhea while taking prednisone. She is more fatigued on the higher dose of prednisone. She is working. Mild dyspnea. The arthralgias have improved while higher dose prednisone.  Objective:  Vital signs in last 24 hours:  Blood pressure (!) 144/62, pulse 78, temperature 98.3 F (36.8 C), temperature source Oral, resp. rate 18, height 5\' 4"  (1.626 m), weight 191 lb 14.4 oz (87 kg), SpO2 98 %.    HEENT: No thrush Resp: Decreased breath sounds at the left posterior base, no respiratory distress Cardio: Regular rate and rhythm GI: No hepatosplenomegaly Vascular: No leg edema   Lab Results:  Lab Results  Component Value Date   WBC 16.1 (H) 06/04/2016   HGB 11.7 06/04/2016   HCT 36.8 06/04/2016   MCV 98.9 06/04/2016   PLT 282 06/04/2016   NEUTROABS 13.4 (H) 06/04/2016    Medications: I have reviewed the patient's current medications.  Assessment/Plan: 1. 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, DAT IgG positive  Prednisone started 03/20/2016  Tapered to 15 mg daily on 04/30/2016  Hemoglobin lower, elevated LDH 05/21/2016-prednisone increased to 30 mrem daily  2. Exertional dyspnea secondary to #1  3. Mild splenomegaly on CT 03/16/2016  4. History of polymyalgia rheumatica  5. History of giant cell/temporal arteritis-maintained on methotrexate in the remote past, now on low dose prednisone     Disposition:  She appears unchanged. The hemoglobin is higher today. She will continue prednisone at a dose of 39 g daily. She will return for a CBC in 2 weeks. We will decrease the prednisone to 25 mg daily if the hemoglobin is higher in 2 weeks.  We discussed treatment beyond  prednisone. The plan is to recommend rituximab or a splenectomy if we cannot taper the prednisone below 10 mg daily. I reviewed the potential toxicities associated with rituximab. We also discussed the morbidity associated with splenectomy. She is more comfortable with a splenectomy.  Ms. Brutus will receive an influenza vaccine today.  She will return for a lab visit in 2 weeks and an office visit in 4 weeks.  She will resume HCTZ for hypertension.  Betsy Coder, MD  06/04/2016  9:57 AM

## 2016-06-05 ENCOUNTER — Ambulatory Visit (INDEPENDENT_AMBULATORY_CARE_PROVIDER_SITE_OTHER): Payer: No Typology Code available for payment source | Admitting: Licensed Clinical Social Worker

## 2016-06-05 DIAGNOSIS — F419 Anxiety disorder, unspecified: Secondary | ICD-10-CM

## 2016-06-18 ENCOUNTER — Telehealth: Payer: Self-pay | Admitting: *Deleted

## 2016-06-18 ENCOUNTER — Other Ambulatory Visit (HOSPITAL_BASED_OUTPATIENT_CLINIC_OR_DEPARTMENT_OTHER): Payer: No Typology Code available for payment source

## 2016-06-18 DIAGNOSIS — D599 Acquired hemolytic anemia, unspecified: Secondary | ICD-10-CM

## 2016-06-18 LAB — CBC & DIFF AND RETIC
BASO%: 0.5 % (ref 0.0–2.0)
BASOS ABS: 0.1 10*3/uL (ref 0.0–0.1)
EOS ABS: 0.2 10*3/uL (ref 0.0–0.5)
EOS%: 1.3 % (ref 0.0–7.0)
HEMATOCRIT: 40.4 % (ref 34.8–46.6)
HEMOGLOBIN: 13.2 g/dL (ref 11.6–15.9)
IMMATURE RETIC FRACT: 4.7 % (ref 1.60–10.00)
LYMPH#: 4.5 10*3/uL — AB (ref 0.9–3.3)
LYMPH%: 35.5 % (ref 14.0–49.7)
MCH: 31.1 pg (ref 25.1–34.0)
MCHC: 32.7 g/dL (ref 31.5–36.0)
MCV: 95.3 fL (ref 79.5–101.0)
MONO#: 0.5 10*3/uL (ref 0.1–0.9)
MONO%: 4 % (ref 0.0–14.0)
NEUT#: 7.5 10*3/uL — ABNORMAL HIGH (ref 1.5–6.5)
NEUT%: 58.7 % (ref 38.4–76.8)
PLATELETS: 275 10*3/uL (ref 145–400)
RBC: 4.24 10*6/uL (ref 3.70–5.45)
RDW: 14.5 % (ref 11.2–14.5)
RETIC %: 3.17 % — AB (ref 0.70–2.10)
RETIC CT ABS: 134.41 10*3/uL — AB (ref 33.70–90.70)
WBC: 12.8 10*3/uL — ABNORMAL HIGH (ref 3.9–10.3)

## 2016-06-18 MED ORDER — PREDNISONE 20 MG PO TABS: ORAL_TABLET | ORAL | 0 refills | Status: DC

## 2016-06-18 NOTE — Telephone Encounter (Signed)
-----   Message from Ladell Pier, MD sent at 06/18/2016 10:29 AM EDT ----- Please call patient,hb is better,decrease prednisone to 25mg  daily

## 2016-06-18 NOTE — Telephone Encounter (Signed)
Called pt with instructions to decrease Prednisone to 25 mg daily. She has 20 mg and 10 mg at home. Pt stated she will break 10 mg tab in half.  She understands to follow up as scheduled on 11/13. Pt also requested that we notate her schedule so she does not have the same phlebotomist she had today in the future. Request forwarded to lab receptionist.

## 2016-06-25 ENCOUNTER — Other Ambulatory Visit: Payer: Self-pay | Admitting: Oncology

## 2016-06-25 ENCOUNTER — Ambulatory Visit: Payer: No Typology Code available for payment source | Admitting: Licensed Clinical Social Worker

## 2016-07-02 ENCOUNTER — Ambulatory Visit (HOSPITAL_BASED_OUTPATIENT_CLINIC_OR_DEPARTMENT_OTHER): Payer: No Typology Code available for payment source | Admitting: Oncology

## 2016-07-02 ENCOUNTER — Other Ambulatory Visit (HOSPITAL_BASED_OUTPATIENT_CLINIC_OR_DEPARTMENT_OTHER): Payer: No Typology Code available for payment source

## 2016-07-02 ENCOUNTER — Telehealth: Payer: Self-pay | Admitting: Oncology

## 2016-07-02 VITALS — BP 128/59 | HR 70 | Temp 98.2°F | Resp 18 | Ht 64.0 in | Wt 188.9 lb

## 2016-07-02 DIAGNOSIS — R161 Splenomegaly, not elsewhere classified: Secondary | ICD-10-CM

## 2016-07-02 DIAGNOSIS — D599 Acquired hemolytic anemia, unspecified: Secondary | ICD-10-CM

## 2016-07-02 LAB — COMPREHENSIVE METABOLIC PANEL
ALT: 11 U/L (ref 0–55)
ANION GAP: 10 meq/L (ref 3–11)
AST: 19 U/L (ref 5–34)
Albumin: 3.9 g/dL (ref 3.5–5.0)
Alkaline Phosphatase: 82 U/L (ref 40–150)
BUN: 16.9 mg/dL (ref 7.0–26.0)
CALCIUM: 9.7 mg/dL (ref 8.4–10.4)
CHLORIDE: 102 meq/L (ref 98–109)
CO2: 32 mEq/L — ABNORMAL HIGH (ref 22–29)
CREATININE: 0.8 mg/dL (ref 0.6–1.1)
EGFR: 74 mL/min/{1.73_m2} — AB (ref >90)
Glucose: 103 mg/dl (ref 70–140)
Potassium: 3.5 mEq/L (ref 3.5–5.1)
Sodium: 144 mEq/L (ref 136–145)
Total Bilirubin: 0.72 mg/dL (ref 0.20–1.20)
Total Protein: 6.3 g/dL — ABNORMAL LOW (ref 6.4–8.3)

## 2016-07-02 LAB — CBC & DIFF AND RETIC
BASO%: 0.4 % (ref 0.0–2.0)
Basophils Absolute: 0.1 10*3/uL (ref 0.0–0.1)
EOS%: 1 % (ref 0.0–7.0)
Eosinophils Absolute: 0.1 10*3/uL (ref 0.0–0.5)
HCT: 41.4 % (ref 34.8–46.6)
HGB: 13.4 g/dL (ref 11.6–15.9)
IMMATURE RETIC FRACT: 4.9 % (ref 1.60–10.00)
LYMPH%: 27.7 % (ref 14.0–49.7)
MCH: 30.9 pg (ref 25.1–34.0)
MCHC: 32.4 g/dL (ref 31.5–36.0)
MCV: 95.6 fL (ref 79.5–101.0)
MONO#: 0.4 10*3/uL (ref 0.1–0.9)
MONO%: 3.2 % (ref 0.0–14.0)
NEUT%: 67.7 % (ref 38.4–76.8)
NEUTROS ABS: 9 10*3/uL — AB (ref 1.5–6.5)
Platelets: 262 10*3/uL (ref 145–400)
RBC: 4.33 10*6/uL (ref 3.70–5.45)
RDW: 14.6 % — ABNORMAL HIGH (ref 11.2–14.5)
Retic %: 3.67 % — ABNORMAL HIGH (ref 0.70–2.10)
Retic Ct Abs: 158.91 10*3/uL — ABNORMAL HIGH (ref 33.70–90.70)
WBC: 13.3 10*3/uL — AB (ref 3.9–10.3)
lymph#: 3.7 10*3/uL — ABNORMAL HIGH (ref 0.9–3.3)

## 2016-07-02 LAB — LACTATE DEHYDROGENASE: LDH: 277 U/L — AB (ref 125–245)

## 2016-07-02 NOTE — Telephone Encounter (Signed)
GAVE PATIENT AVS REPORT AND APPOINTMENTS FOR November THRU January.  °

## 2016-07-02 NOTE — Progress Notes (Signed)
  [place] [facility] OFFICE PROGRESS NOTE   Diagnosis: Autoimmune hemolytic anemia  INTERVAL HISTORY:   She returns as scheduled. She has been maintained on prednisone at a dose of 25 mg daily since she was here on 06/18/2016. She reports an improved energy level. She is seeing her rheumatologist today.  Objective:  Vital signs in last 24 hours:  Blood pressure (!) 128/59, pulse 70, temperature 98.2 F (36.8 C), temperature source Oral, resp. rate 18, height 5\' 4"  (1.626 m), weight 188 lb 14.4 oz (85.7 kg), SpO2 97 %.    HEENT: No thrush Resp: Lungs clear bilaterally Cardio: Regular rate and rhythm GI: No hepatosplenomegaly, no mass Vascular: No leg edema   Lab Results:  Lab Results  Component Value Date   WBC 13.3 (H) 07/02/2016   HGB 13.4 07/02/2016   HCT 41.4 07/02/2016   MCV 95.6 07/02/2016   PLT 262 07/02/2016   NEUTROABS 9.0 (H) 07/02/2016     Medications: I have reviewed the patient's current medications.  Assessment/Plan: 1. 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, DAT IgG positive  Prednisone started 03/20/2016  Tapered to 15 mg daily on 04/30/2016  Hemoglobin lower, elevated LDH 05/21/2016-prednisone increased to 30 mg daily  Prednisone taper to 25 mg daily 06/18/2016  2. Exertional dyspnea secondary to #1  3. Mild splenomegaly on CT 03/16/2016  4. History of polymyalgia rheumatica  5. History of giant cell/temporal arteritis-maintained on methotrexate in the remote past, now on low dose prednisone      Disposition:  She appears well. The hemoglobin has increased with the higher prednisone dose. She would like to maintain the current dose for now. She will return for a CBC in 2 weeks. The plan is to taper the prednisone to 29 g daily if the hemoglobin is stable when she returns in 2 weeks.  She will be scheduled for a CBC on 08/06/2016 and an office visit on 08/27/2016.  Betsy Coder, MD  07/02/2016  9:47 AM

## 2016-07-16 ENCOUNTER — Ambulatory Visit (INDEPENDENT_AMBULATORY_CARE_PROVIDER_SITE_OTHER): Payer: No Typology Code available for payment source | Admitting: Licensed Clinical Social Worker

## 2016-07-16 ENCOUNTER — Telehealth: Payer: Self-pay | Admitting: *Deleted

## 2016-07-16 ENCOUNTER — Other Ambulatory Visit (HOSPITAL_BASED_OUTPATIENT_CLINIC_OR_DEPARTMENT_OTHER): Payer: No Typology Code available for payment source

## 2016-07-16 DIAGNOSIS — F419 Anxiety disorder, unspecified: Secondary | ICD-10-CM | POA: Diagnosis not present

## 2016-07-16 DIAGNOSIS — D599 Acquired hemolytic anemia, unspecified: Secondary | ICD-10-CM

## 2016-07-16 LAB — CBC WITH DIFFERENTIAL/PLATELET
BASO%: 0.5 % (ref 0.0–2.0)
Basophils Absolute: 0.1 10*3/uL (ref 0.0–0.1)
EOS%: 1.1 % (ref 0.0–7.0)
Eosinophils Absolute: 0.1 10*3/uL (ref 0.0–0.5)
HCT: 39.4 % (ref 34.8–46.6)
HGB: 12.6 g/dL (ref 11.6–15.9)
LYMPH%: 36.8 % (ref 14.0–49.7)
MCH: 30.6 pg (ref 25.1–34.0)
MCHC: 32 g/dL (ref 31.5–36.0)
MCV: 95.6 fL (ref 79.5–101.0)
MONO#: 0.5 10*3/uL (ref 0.1–0.9)
MONO%: 4.1 % (ref 0.0–14.0)
NEUT%: 57.5 % (ref 38.4–76.8)
NEUTROS ABS: 6.4 10*3/uL (ref 1.5–6.5)
PLATELETS: 245 10*3/uL (ref 145–400)
RBC: 4.12 10*6/uL (ref 3.70–5.45)
RDW: 14.9 % — ABNORMAL HIGH (ref 11.2–14.5)
WBC: 11.2 10*3/uL — AB (ref 3.9–10.3)
lymph#: 4.1 10*3/uL — ABNORMAL HIGH (ref 0.9–3.3)

## 2016-07-16 MED ORDER — PREDNISONE 10 MG PO TABS: 20.0000 mg | ORAL_TABLET | Freq: Every day | ORAL | 0 refills | Status: DC

## 2016-07-16 NOTE — Telephone Encounter (Signed)
-----   Message from Ladell Pier, MD sent at 07/16/2016  1:52 PM EST ----- Please call patient, hb is stable, decrease prednisone to 20mg  daily , f/u as scheduled

## 2016-07-16 NOTE — Telephone Encounter (Signed)
Called pt with HGB result. Stable, per Dr. Benay Spice. Decrease prednisone to 20mg  daily. She voiced understanding. Confirmed next lab appt.

## 2016-08-03 ENCOUNTER — Other Ambulatory Visit: Payer: Self-pay | Admitting: Oncology

## 2016-08-03 ENCOUNTER — Telehealth: Payer: Self-pay | Admitting: *Deleted

## 2016-08-03 NOTE — Telephone Encounter (Signed)
Received refill request for prednisone. Spoke with pt, she does not have enough to last until next lab appt. Refill sent to pharmacy.

## 2016-08-06 ENCOUNTER — Other Ambulatory Visit (HOSPITAL_BASED_OUTPATIENT_CLINIC_OR_DEPARTMENT_OTHER): Payer: No Typology Code available for payment source

## 2016-08-06 DIAGNOSIS — D599 Acquired hemolytic anemia, unspecified: Secondary | ICD-10-CM | POA: Diagnosis not present

## 2016-08-06 LAB — CBC WITH DIFFERENTIAL/PLATELET
BASO%: 0.8 % (ref 0.0–2.0)
BASOS ABS: 0.1 10*3/uL (ref 0.0–0.1)
EOS%: 1.4 % (ref 0.0–7.0)
Eosinophils Absolute: 0.1 10*3/uL (ref 0.0–0.5)
HCT: 39 % (ref 34.8–46.6)
HEMOGLOBIN: 13.1 g/dL (ref 11.6–15.9)
LYMPH%: 28.8 % (ref 14.0–49.7)
MCH: 31 pg (ref 25.1–34.0)
MCHC: 33.4 g/dL (ref 31.5–36.0)
MCV: 92.8 fL (ref 79.5–101.0)
MONO#: 0.5 10*3/uL (ref 0.1–0.9)
MONO%: 4.8 % (ref 0.0–14.0)
NEUT%: 64.2 % (ref 38.4–76.8)
NEUTROS ABS: 6.6 10*3/uL — AB (ref 1.5–6.5)
Platelets: 266 10*3/uL (ref 145–400)
RBC: 4.2 10*6/uL (ref 3.70–5.45)
RDW: 15.2 % — AB (ref 11.2–14.5)
WBC: 10.3 10*3/uL (ref 3.9–10.3)
lymph#: 3 10*3/uL (ref 0.9–3.3)

## 2016-08-07 ENCOUNTER — Telehealth: Payer: Self-pay | Admitting: *Deleted

## 2016-08-07 NOTE — Telephone Encounter (Signed)
Return call received from patient.  Patient informed per Dr. Benay Spice to decrease Prednisone to 15 mg daily and to f/u as scheduled.  Patient states that she will not decrease Prednisone to 15 mg until her next appt with Dr. Benay Spice on 08/27/16.  Will inform Dr. Benay Spice.

## 2016-08-07 NOTE — Telephone Encounter (Signed)
-----   Message from Ladell Pier, MD sent at 08/06/2016  9:33 PM EST ----- Please call patient,decrease prednisone to 15mg  daily, f/u as scheduled

## 2016-08-27 ENCOUNTER — Ambulatory Visit (HOSPITAL_BASED_OUTPATIENT_CLINIC_OR_DEPARTMENT_OTHER): Payer: Medicare Other | Admitting: Oncology

## 2016-08-27 ENCOUNTER — Other Ambulatory Visit: Payer: Self-pay | Admitting: *Deleted

## 2016-08-27 ENCOUNTER — Other Ambulatory Visit (HOSPITAL_BASED_OUTPATIENT_CLINIC_OR_DEPARTMENT_OTHER): Payer: Medicare Other

## 2016-08-27 ENCOUNTER — Telehealth: Payer: Self-pay | Admitting: Oncology

## 2016-08-27 VITALS — BP 148/55 | HR 77 | Temp 97.6°F | Resp 17 | Ht 64.0 in | Wt 197.4 lb

## 2016-08-27 DIAGNOSIS — Z7952 Long term (current) use of systemic steroids: Secondary | ICD-10-CM | POA: Diagnosis not present

## 2016-08-27 DIAGNOSIS — D599 Acquired hemolytic anemia, unspecified: Secondary | ICD-10-CM

## 2016-08-27 DIAGNOSIS — D591 Other autoimmune hemolytic anemias: Secondary | ICD-10-CM

## 2016-08-27 DIAGNOSIS — E2839 Other primary ovarian failure: Secondary | ICD-10-CM | POA: Diagnosis not present

## 2016-08-27 DIAGNOSIS — M8588 Other specified disorders of bone density and structure, other site: Secondary | ICD-10-CM | POA: Diagnosis not present

## 2016-08-27 LAB — CBC WITH DIFFERENTIAL/PLATELET
BASO%: 1.1 % (ref 0.0–2.0)
BASOS ABS: 0.1 10*3/uL (ref 0.0–0.1)
EOS ABS: 0.1 10*3/uL (ref 0.0–0.5)
EOS%: 1.1 % (ref 0.0–7.0)
HCT: 37.7 % (ref 34.8–46.6)
HGB: 12.6 g/dL (ref 11.6–15.9)
LYMPH%: 24.8 % (ref 14.0–49.7)
MCH: 31 pg (ref 25.1–34.0)
MCHC: 33.5 g/dL (ref 31.5–36.0)
MCV: 92.4 fL (ref 79.5–101.0)
MONO#: 0.7 10*3/uL (ref 0.1–0.9)
MONO%: 5.5 % (ref 0.0–14.0)
NEUT#: 8.7 10*3/uL — ABNORMAL HIGH (ref 1.5–6.5)
NEUT%: 67.5 % (ref 38.4–76.8)
Platelets: 244 10*3/uL (ref 145–400)
RBC: 4.08 10*6/uL (ref 3.70–5.45)
RDW: 15.2 % — ABNORMAL HIGH (ref 11.2–14.5)
WBC: 12.8 10*3/uL — AB (ref 3.9–10.3)
lymph#: 3.2 10*3/uL (ref 0.9–3.3)

## 2016-08-27 MED ORDER — PREDNISONE 5 MG PO TABS: 5.0000 mg | ORAL_TABLET | Freq: Every day | ORAL | 0 refills | Status: DC

## 2016-08-27 NOTE — Progress Notes (Signed)
  [facility] OFFICE PROGRESS NOTE   Diagnosis:  Autoimmune hemolytic anemia  INTERVAL HISTORY:   Michelle Wilcox returns as scheduled. She continues prednisone at a dose of 20 mg daily. No new complaint.  Objective:  Vital signs in last 24 hours:  Blood pressure (!) 148/55, pulse 77, temperature 97.6 F (36.4 C), temperature source Oral, resp. rate 17, height 5\' 4"  (1.626 m), weight 197 lb 6 oz (89.5 kg), SpO2 95 %.    HEENT: No thrush Resp: Lungs clear bilaterally Cardio: Regular rate and rhythm GI: No hepatosplenomegaly Vascular: No leg edema  Lab Results:  Lab Results  Component Value Date   WBC 12.8 (H) 08/27/2016   HGB 12.6 08/27/2016   HCT 37.7 08/27/2016   MCV 92.4 08/27/2016   PLT 244 08/27/2016   NEUTROABS 8.7 (H) 08/27/2016     Medications: I have reviewed the patient's current medications.  Assessment/Plan: 1. 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, DAT IgG positive  Prednisone started 03/20/2016  Tapered to 15 mg daily on 04/30/2016  Hemoglobin lower, elevated LDH 05/21/2016-prednisone increased to 30 mg daily  Prednisone taper to 25 mg daily 06/18/2016  Prednisone taper to 20 mg daily 07/16/2016  Prednisone taper to 15 mg daily 08/27/2016  2. Exertional dyspnea secondary to #1  3. Mild splenomegaly on CT 03/16/2016  4. History of polymyalgia rheumatica  5. History of giant cell/temporal arteritis-maintained on methotrexate in the remote past, now on low dose prednisone      Disposition:  Ms. Tra appears unchanged. The hemoglobin is stable. We tapered the prednisone to 15 mg daily. She will return for a CBC in 2 weeks and 4 weeks. She is scheduled for an office visit in 6 weeks.  She is reluctant to proceed with rituximab if we are unable to taper the prednisone. She prefers a splenectomy.  Betsy Coder, MD  08/27/2016  9:33 AM

## 2016-08-27 NOTE — Telephone Encounter (Signed)
GAVE PATIENT AVS REPORT AND APPOINTMENTS FOR January AND February

## 2016-09-03 ENCOUNTER — Ambulatory Visit (INDEPENDENT_AMBULATORY_CARE_PROVIDER_SITE_OTHER): Payer: Medicare Other | Admitting: Licensed Clinical Social Worker

## 2016-09-03 DIAGNOSIS — F419 Anxiety disorder, unspecified: Secondary | ICD-10-CM | POA: Diagnosis not present

## 2016-09-04 ENCOUNTER — Other Ambulatory Visit: Payer: Self-pay | Admitting: *Deleted

## 2016-09-04 MED ORDER — PREDNISONE 10 MG PO TABS: ORAL_TABLET | ORAL | 0 refills | Status: DC

## 2016-09-10 ENCOUNTER — Other Ambulatory Visit (HOSPITAL_BASED_OUTPATIENT_CLINIC_OR_DEPARTMENT_OTHER): Payer: Medicare Other

## 2016-09-10 ENCOUNTER — Telehealth: Payer: Self-pay | Admitting: *Deleted

## 2016-09-10 DIAGNOSIS — D591 Other autoimmune hemolytic anemias: Secondary | ICD-10-CM | POA: Diagnosis present

## 2016-09-10 DIAGNOSIS — D599 Acquired hemolytic anemia, unspecified: Secondary | ICD-10-CM

## 2016-09-10 LAB — CBC WITH DIFFERENTIAL/PLATELET
BASO%: 1 % (ref 0.0–2.0)
BASOS ABS: 0.1 10*3/uL (ref 0.0–0.1)
EOS ABS: 0.1 10*3/uL (ref 0.0–0.5)
EOS%: 0.4 % (ref 0.0–7.0)
HCT: 40.1 % (ref 34.8–46.6)
HGB: 13.3 g/dL (ref 11.6–15.9)
LYMPH%: 11.9 % — AB (ref 14.0–49.7)
MCH: 30.6 pg (ref 25.1–34.0)
MCHC: 33.2 g/dL (ref 31.5–36.0)
MCV: 92 fL (ref 79.5–101.0)
MONO#: 0.3 10*3/uL (ref 0.1–0.9)
MONO%: 2.1 % (ref 0.0–14.0)
NEUT%: 84.6 % — AB (ref 38.4–76.8)
NEUTROS ABS: 10.7 10*3/uL — AB (ref 1.5–6.5)
PLATELETS: 269 10*3/uL (ref 145–400)
RBC: 4.36 10*6/uL (ref 3.70–5.45)
RDW: 15.3 % — ABNORMAL HIGH (ref 11.2–14.5)
WBC: 12.6 10*3/uL — ABNORMAL HIGH (ref 3.9–10.3)
lymph#: 1.5 10*3/uL (ref 0.9–3.3)

## 2016-09-10 NOTE — Telephone Encounter (Signed)
Called pt with instructions to decrease Prednisone to 12.5 mg daily. She stated she is uncomfortable decreasing prednisone after being at this dose for only 2 weeks.  Pt wants to continue 15 mg for 2 more weeks.  Dr. Benay Spice made aware.

## 2016-09-10 NOTE — Telephone Encounter (Signed)
-----   Message from Ladell Pier, MD sent at 09/10/2016  4:39 PM EST ----- Please call patient, hb is stable, decrease prednisone to 12.5mg  daily, f/u as scheduled

## 2016-09-24 ENCOUNTER — Other Ambulatory Visit (HOSPITAL_BASED_OUTPATIENT_CLINIC_OR_DEPARTMENT_OTHER): Payer: Medicare Other

## 2016-09-24 DIAGNOSIS — D599 Acquired hemolytic anemia, unspecified: Secondary | ICD-10-CM

## 2016-09-24 DIAGNOSIS — D591 Other autoimmune hemolytic anemias: Secondary | ICD-10-CM | POA: Diagnosis present

## 2016-09-24 LAB — CBC WITH DIFFERENTIAL/PLATELET
BASO%: 0.3 % (ref 0.0–2.0)
BASOS ABS: 0 10*3/uL (ref 0.0–0.1)
EOS%: 0.4 % (ref 0.0–7.0)
Eosinophils Absolute: 0 10*3/uL (ref 0.0–0.5)
HCT: 41.6 % (ref 34.8–46.6)
HEMOGLOBIN: 13.5 g/dL (ref 11.6–15.9)
LYMPH#: 1.4 10*3/uL (ref 0.9–3.3)
LYMPH%: 13.4 % — ABNORMAL LOW (ref 14.0–49.7)
MCH: 31 pg (ref 25.1–34.0)
MCHC: 32.5 g/dL (ref 31.5–36.0)
MCV: 95.4 fL (ref 79.5–101.0)
MONO#: 0.2 10*3/uL (ref 0.1–0.9)
MONO%: 2 % (ref 0.0–14.0)
NEUT#: 9 10*3/uL — ABNORMAL HIGH (ref 1.5–6.5)
NEUT%: 83.9 % — AB (ref 38.4–76.8)
PLATELETS: 246 10*3/uL (ref 145–400)
RBC: 4.36 10*6/uL (ref 3.70–5.45)
RDW: 15 % — ABNORMAL HIGH (ref 11.2–14.5)
WBC: 10.7 10*3/uL — ABNORMAL HIGH (ref 3.9–10.3)

## 2016-09-25 ENCOUNTER — Telehealth: Payer: Self-pay

## 2016-09-25 MED ORDER — PREDNISONE 5 MG PO TABS: 2.5000 mg | ORAL_TABLET | Freq: Every day | ORAL | 0 refills | Status: DC

## 2016-09-25 NOTE — Telephone Encounter (Signed)
-----   Message from Ladell Pier, MD sent at 09/24/2016  6:09 PM EST ----- Please call patient, hb is stable, we will taper prednisone to 12.5mg  if stable in 2 weeks

## 2016-09-25 NOTE — Telephone Encounter (Signed)
Pt states that on 1/22 it was decided to decrease prednisone in 2 more weeks to 12.5. So today she cut it to 12.5. She has plenty of 10 mg. She will need a refill of 5 mg tabs. Refill done.  This forwarded to Dr Benay Spice.

## 2016-09-25 NOTE — Telephone Encounter (Signed)
Left message for pt to call back regarding labs.

## 2016-10-01 ENCOUNTER — Ambulatory Visit: Payer: Medicare Other | Admitting: Licensed Clinical Social Worker

## 2016-10-08 ENCOUNTER — Telehealth: Payer: Self-pay | Admitting: Nurse Practitioner

## 2016-10-08 ENCOUNTER — Other Ambulatory Visit (HOSPITAL_BASED_OUTPATIENT_CLINIC_OR_DEPARTMENT_OTHER): Payer: Medicare Other

## 2016-10-08 ENCOUNTER — Ambulatory Visit (HOSPITAL_BASED_OUTPATIENT_CLINIC_OR_DEPARTMENT_OTHER): Payer: Medicare Other | Admitting: Nurse Practitioner

## 2016-10-08 VITALS — BP 112/61 | HR 75 | Temp 98.0°F | Resp 18 | Ht 64.0 in | Wt 198.8 lb

## 2016-10-08 DIAGNOSIS — D591 Other autoimmune hemolytic anemias: Secondary | ICD-10-CM | POA: Diagnosis not present

## 2016-10-08 DIAGNOSIS — D5919 Other autoimmune hemolytic anemia: Secondary | ICD-10-CM

## 2016-10-08 DIAGNOSIS — D599 Acquired hemolytic anemia, unspecified: Secondary | ICD-10-CM

## 2016-10-08 DIAGNOSIS — R161 Splenomegaly, not elsewhere classified: Secondary | ICD-10-CM | POA: Diagnosis not present

## 2016-10-08 LAB — CBC WITH DIFFERENTIAL/PLATELET
BASO%: 1.6 % (ref 0.0–2.0)
Basophils Absolute: 0.1 10*3/uL (ref 0.0–0.1)
EOS ABS: 0.2 10*3/uL (ref 0.0–0.5)
EOS%: 1.8 % (ref 0.0–7.0)
HCT: 33.7 % — ABNORMAL LOW (ref 34.8–46.6)
HGB: 11.5 g/dL — ABNORMAL LOW (ref 11.6–15.9)
LYMPH#: 1.7 10*3/uL (ref 0.9–3.3)
LYMPH%: 18.8 % (ref 14.0–49.7)
MCH: 32.2 pg (ref 25.1–34.0)
MCHC: 34 g/dL (ref 31.5–36.0)
MCV: 94.6 fL (ref 79.5–101.0)
MONO#: 0.5 10*3/uL (ref 0.1–0.9)
MONO%: 5 % (ref 0.0–14.0)
NEUT%: 72.8 % (ref 38.4–76.8)
NEUTROS ABS: 6.7 10*3/uL — AB (ref 1.5–6.5)
PLATELETS: 225 10*3/uL (ref 145–400)
RBC: 3.57 10*6/uL — AB (ref 3.70–5.45)
RDW: 15.3 % — ABNORMAL HIGH (ref 11.2–14.5)
WBC: 9.2 10*3/uL (ref 3.9–10.3)

## 2016-10-08 NOTE — Progress Notes (Addendum)
  [place] OFFICE PROGRESS NOTE   Diagnosis:  Autoimmune hemolytic anemia  INTERVAL HISTORY:   Michelle Wilcox returns as scheduled. Prednisone was tapered to 12.5 mg daily beginning 09/25/2016. She describes her energy level as "okay". No leg swelling.  Objective:  Vital signs in last 24 hours:  Blood pressure 112/61, pulse 75, temperature 98 F (36.7 C), temperature source Oral, resp. rate 18, height 5\' 4"  (1.626 m), weight 198 lb 12.8 oz (90.2 kg), SpO2 98 %.    HEENT: No thrush. Resp: Lungs clear bilaterally. Cardio: Regular rate and rhythm. GI: Abdomen soft and nontender. No organomegaly. Vascular: No leg edema. Calves soft and nontender.    Lab Results:  Lab Results  Component Value Date   WBC 9.2 10/08/2016   HGB 11.5 (L) 10/08/2016   HCT 33.7 (L) 10/08/2016   MCV 94.6 10/08/2016   PLT 225 10/08/2016   NEUTROABS 6.7 (H) 10/08/2016    Imaging:  No results found.  Medications: I have reviewed the patient's current medications.  Assessment/Plan: 1. 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, DAT IgG positive  Prednisone started 03/20/2016  Tapered to 15 mg daily on 04/30/2016  Hemoglobin lower, elevated LDH 05/21/2016-prednisone increased to 30 mgdaily  Prednisone taper to 25 mg daily 06/18/2016  Prednisone taper to 20 mg daily 07/16/2016  Prednisone taper to 15 mg daily 08/27/2016  Prednisone taper to 12.5 mg daily 09/25/2016  2. Exertional dyspnea secondary to #1  3. Mild splenomegaly on CT 03/16/2016  4. History of polymyalgia rheumatica  5. History of giant cell/temporal arteritis-maintained on methotrexate in the remote past, now on low dose prednisone   Disposition: Michelle Wilcox appears stable. The hemoglobin has declined. She will continue prednisone 12.5 mg daily. She will return for labs and a follow-up visit in 3 weeks. She will contact the office in the interim with any  problems. We specifically discussed signs/symptoms suggestive of progressive anemia.  Patient seen with Dr. Benay Spice.    Ned Card ANP/GNP-BC   10/08/2016  9:34 AM  This was a shared visit with Ned Card. The hemoglobin has dropped over the past few weeks. We decided to continue prednisone at current dose. She will return for an office visit and CBC in 3 weeks. We will consider rituximab or referral for a splenectomy if the hemoglobin falls further.  [name], M.D.

## 2016-10-08 NOTE — Telephone Encounter (Signed)
Appointments scheduled per 2/19 LOS. Follow up appointment scheduled on 3/19 per patient availability, she has prior office visits during the week of 3/12. Called LT desk nurse to confirm  Okay to schedule. Patient given AVS report and calendars with future scheduled appointments.

## 2016-10-10 ENCOUNTER — Telehealth: Payer: Self-pay | Admitting: *Deleted

## 2016-10-10 DIAGNOSIS — D599 Acquired hemolytic anemia, unspecified: Secondary | ICD-10-CM

## 2016-10-10 DIAGNOSIS — N6310 Unspecified lump in the right breast, unspecified quadrant: Secondary | ICD-10-CM

## 2016-10-10 NOTE — Telephone Encounter (Signed)
"  I was there Monday.  Dr. Benay Spice told me if I feel poorly to call for labs to be checked.  I feel terrible and need lab.  Extremely fatigued."  Collaborated with Dr. Gearldine Shown nurse.  "I can come in tomorrow morning at 8:15.  Call me at work [phone] ext [phone] if time needs to be changed.  If I do not hear from you, I'll come in at 8:15."  Instructed to wait for lab results.  Scheduling message sent.

## 2016-10-11 ENCOUNTER — Other Ambulatory Visit (HOSPITAL_BASED_OUTPATIENT_CLINIC_OR_DEPARTMENT_OTHER): Payer: Medicare Other

## 2016-10-11 ENCOUNTER — Telehealth: Payer: Self-pay | Admitting: *Deleted

## 2016-10-11 ENCOUNTER — Telehealth: Payer: Self-pay | Admitting: Oncology

## 2016-10-11 DIAGNOSIS — D5919 Other autoimmune hemolytic anemia: Secondary | ICD-10-CM

## 2016-10-11 DIAGNOSIS — D599 Acquired hemolytic anemia, unspecified: Secondary | ICD-10-CM

## 2016-10-11 DIAGNOSIS — D591 Other autoimmune hemolytic anemias: Secondary | ICD-10-CM | POA: Diagnosis present

## 2016-10-11 DIAGNOSIS — N6310 Unspecified lump in the right breast, unspecified quadrant: Secondary | ICD-10-CM

## 2016-10-11 LAB — CBC WITH DIFFERENTIAL/PLATELET
BASO%: 1.2 % (ref 0.0–2.0)
Basophils Absolute: 0.1 10*3/uL (ref 0.0–0.1)
EOS ABS: 0.1 10*3/uL (ref 0.0–0.5)
EOS%: 1.5 % (ref 0.0–7.0)
HCT: 32.8 % — ABNORMAL LOW (ref 34.8–46.6)
HGB: 10.6 g/dL — ABNORMAL LOW (ref 11.6–15.9)
LYMPH%: 21.5 % (ref 14.0–49.7)
MCH: 31.3 pg (ref 25.1–34.0)
MCHC: 32.3 g/dL (ref 31.5–36.0)
MCV: 96.8 fL (ref 79.5–101.0)
MONO#: 0.6 10*3/uL (ref 0.1–0.9)
MONO%: 7 % (ref 0.0–14.0)
NEUT#: 5.4 10*3/uL (ref 1.5–6.5)
NEUT%: 68.8 % (ref 38.4–76.8)
PLATELETS: 204 10*3/uL (ref 145–400)
RBC: 3.39 10*6/uL — AB (ref 3.70–5.45)
RDW: 15.2 % — ABNORMAL HIGH (ref 11.2–14.5)
WBC: 7.8 10*3/uL (ref 3.9–10.3)
lymph#: 1.7 10*3/uL (ref 0.9–3.3)

## 2016-10-11 MED ORDER — PREDNISONE 10 MG PO TABS: 20.0000 mg | ORAL_TABLET | Freq: Every day | ORAL | 0 refills | Status: DC

## 2016-10-11 NOTE — Telephone Encounter (Signed)
Late entry: CBC reviewed by Dr. Benay Spice: HGB dropping. Increase prednisone to 20 mg daily. Check lab early next week. Spoke with pt in lobby, she voiced understanding. She has enough Prednisone to take increased dose.

## 2016-10-11 NOTE — Telephone Encounter (Signed)
Appointments scheduled per 2/22 LOS. Patient given AVS report and calendars with future scheduled appointments. °

## 2016-10-16 ENCOUNTER — Other Ambulatory Visit (HOSPITAL_BASED_OUTPATIENT_CLINIC_OR_DEPARTMENT_OTHER): Payer: Medicare Other

## 2016-10-16 ENCOUNTER — Telehealth: Payer: Self-pay | Admitting: *Deleted

## 2016-10-16 DIAGNOSIS — D599 Acquired hemolytic anemia, unspecified: Secondary | ICD-10-CM

## 2016-10-16 DIAGNOSIS — D591 Other autoimmune hemolytic anemias: Secondary | ICD-10-CM | POA: Diagnosis present

## 2016-10-16 LAB — CBC WITH DIFFERENTIAL/PLATELET
BASO%: 1 % (ref 0.0–2.0)
Basophils Absolute: 0.1 10*3/uL (ref 0.0–0.1)
EOS ABS: 0.1 10*3/uL (ref 0.0–0.5)
EOS%: 1.3 % (ref 0.0–7.0)
HCT: 31.9 % — ABNORMAL LOW (ref 34.8–46.6)
HEMOGLOBIN: 10.9 g/dL — AB (ref 11.6–15.9)
LYMPH#: 2.4 10*3/uL (ref 0.9–3.3)
LYMPH%: 23.7 % (ref 14.0–49.7)
MCH: 33 pg (ref 25.1–34.0)
MCHC: 34.2 g/dL (ref 31.5–36.0)
MCV: 96.4 fL (ref 79.5–101.0)
MONO#: 0.5 10*3/uL (ref 0.1–0.9)
MONO%: 5.2 % (ref 0.0–14.0)
NEUT%: 68.8 % (ref 38.4–76.8)
NEUTROS ABS: 7 10*3/uL — AB (ref 1.5–6.5)
Platelets: 265 10*3/uL (ref 145–400)
RBC: 3.31 10*6/uL — ABNORMAL LOW (ref 3.70–5.45)
RDW: 16.1 % — AB (ref 11.2–14.5)
WBC: 10.1 10*3/uL (ref 3.9–10.3)

## 2016-10-16 NOTE — Telephone Encounter (Signed)
CBC reviewed by Dr. Benay Spice: Continue Prednisone 20 mg daily. Left message on voicemail informing pt.

## 2016-10-22 ENCOUNTER — Other Ambulatory Visit: Payer: Self-pay | Admitting: Oncology

## 2016-10-29 ENCOUNTER — Ambulatory Visit: Payer: Medicare Other | Admitting: Licensed Clinical Social Worker

## 2016-10-29 ENCOUNTER — Other Ambulatory Visit: Payer: Medicare Other

## 2016-10-29 ENCOUNTER — Telehealth: Payer: Self-pay | Admitting: *Deleted

## 2016-10-29 DIAGNOSIS — D599 Acquired hemolytic anemia, unspecified: Secondary | ICD-10-CM

## 2016-10-29 DIAGNOSIS — Z6834 Body mass index (BMI) 34.0-34.9, adult: Secondary | ICD-10-CM | POA: Diagnosis not present

## 2016-10-29 DIAGNOSIS — M150 Primary generalized (osteo)arthritis: Secondary | ICD-10-CM | POA: Diagnosis not present

## 2016-10-29 DIAGNOSIS — D591 Other autoimmune hemolytic anemias: Secondary | ICD-10-CM

## 2016-10-29 DIAGNOSIS — M2550 Pain in unspecified joint: Secondary | ICD-10-CM | POA: Diagnosis not present

## 2016-10-29 DIAGNOSIS — M315 Giant cell arteritis with polymyalgia rheumatica: Secondary | ICD-10-CM | POA: Diagnosis not present

## 2016-10-29 DIAGNOSIS — E669 Obesity, unspecified: Secondary | ICD-10-CM | POA: Diagnosis not present

## 2016-10-29 DIAGNOSIS — M353 Polymyalgia rheumatica: Secondary | ICD-10-CM | POA: Diagnosis not present

## 2016-10-29 LAB — CBC & DIFF AND RETIC
BASO%: 0.8 % (ref 0.0–2.0)
BASOS ABS: 0.1 10*3/uL (ref 0.0–0.1)
EOS%: 1.6 % (ref 0.0–7.0)
Eosinophils Absolute: 0.1 10*3/uL (ref 0.0–0.5)
HEMATOCRIT: 37.9 % (ref 34.8–46.6)
HGB: 12 g/dL (ref 11.6–15.9)
Immature Retic Fract: 6.8 % (ref 1.60–10.00)
LYMPH%: 36.9 % (ref 14.0–49.7)
MCH: 32.2 pg (ref 25.1–34.0)
MCHC: 31.7 g/dL (ref 31.5–36.0)
MCV: 101.6 fL — ABNORMAL HIGH (ref 79.5–101.0)
MONO#: 0.4 10*3/uL (ref 0.1–0.9)
MONO%: 4.8 % (ref 0.0–14.0)
NEUT#: 5 10*3/uL (ref 1.5–6.5)
NEUT%: 55.9 % (ref 38.4–76.8)
PLATELETS: 248 10*3/uL (ref 145–400)
RBC: 3.73 10*6/uL (ref 3.70–5.45)
RDW: 15.5 % — ABNORMAL HIGH (ref 11.2–14.5)
RETIC CT ABS: 221.56 10*3/uL — AB (ref 33.70–90.70)
Retic %: 5.94 % — ABNORMAL HIGH (ref 0.70–2.10)
WBC: 8.9 10*3/uL (ref 3.9–10.3)
lymph#: 3.3 10*3/uL (ref 0.9–3.3)

## 2016-10-29 LAB — COMPREHENSIVE METABOLIC PANEL
ALBUMIN: 3.9 g/dL (ref 3.5–5.0)
ALK PHOS: 74 U/L (ref 40–150)
ALT: 10 U/L (ref 0–55)
AST: 17 U/L (ref 5–34)
Anion Gap: 10 mEq/L (ref 3–11)
BILIRUBIN TOTAL: 0.66 mg/dL (ref 0.20–1.20)
BUN: 22.5 mg/dL (ref 7.0–26.0)
CO2: 31 mEq/L — ABNORMAL HIGH (ref 22–29)
CREATININE: 0.9 mg/dL (ref 0.6–1.1)
Calcium: 9.5 mg/dL (ref 8.4–10.4)
Chloride: 105 mEq/L (ref 98–109)
EGFR: 69 mL/min/{1.73_m2} — ABNORMAL LOW (ref >90)
GLUCOSE: 97 mg/dL (ref 70–140)
Potassium: 3.5 mEq/L (ref 3.5–5.1)
SODIUM: 146 meq/L — AB (ref 136–145)
TOTAL PROTEIN: 6 g/dL — AB (ref 6.4–8.3)

## 2016-10-29 LAB — LACTATE DEHYDROGENASE: LDH: 233 U/L (ref 125–245)

## 2016-10-29 NOTE — Telephone Encounter (Signed)
-----   Message from Ladell Pier, MD sent at 10/29/2016  4:17 PM EDT ----- Please call patient, hb better, continue prednisone 20mg  daily, f/u as scheduled

## 2016-10-29 NOTE — Telephone Encounter (Signed)
Left message on voicemail for pt to continue Prednisone 20 mg daily. Follow up 3/19 as scheduled.

## 2016-10-31 ENCOUNTER — Telehealth: Payer: Self-pay | Admitting: *Deleted

## 2016-10-31 NOTE — Telephone Encounter (Signed)
Tried to return call to patient- mailbox is full unable to leave another message regarding lab results and continue prednisone daily per Benay Spice, MD

## 2016-11-05 ENCOUNTER — Ambulatory Visit (HOSPITAL_BASED_OUTPATIENT_CLINIC_OR_DEPARTMENT_OTHER): Payer: Medicare Other | Admitting: Oncology

## 2016-11-05 ENCOUNTER — Other Ambulatory Visit (HOSPITAL_BASED_OUTPATIENT_CLINIC_OR_DEPARTMENT_OTHER): Payer: Medicare Other

## 2016-11-05 ENCOUNTER — Other Ambulatory Visit: Payer: Medicare Other

## 2016-11-05 ENCOUNTER — Telehealth: Payer: Self-pay | Admitting: Oncology

## 2016-11-05 VITALS — BP 133/68 | HR 76 | Temp 98.7°F | Resp 19 | Ht 64.0 in | Wt 199.6 lb

## 2016-11-05 DIAGNOSIS — D599 Acquired hemolytic anemia, unspecified: Secondary | ICD-10-CM

## 2016-11-05 DIAGNOSIS — D591 Other autoimmune hemolytic anemias: Secondary | ICD-10-CM

## 2016-11-05 DIAGNOSIS — R161 Splenomegaly, not elsewhere classified: Secondary | ICD-10-CM | POA: Diagnosis not present

## 2016-11-05 LAB — CBC WITH DIFFERENTIAL/PLATELET
BASO%: 0.3 % (ref 0.0–2.0)
Basophils Absolute: 0 10*3/uL (ref 0.0–0.1)
EOS ABS: 0 10*3/uL (ref 0.0–0.5)
EOS%: 0.3 % (ref 0.0–7.0)
HCT: 39.9 % (ref 34.8–46.6)
HGB: 12.6 g/dL (ref 11.6–15.9)
LYMPH%: 11.9 % — AB (ref 14.0–49.7)
MCH: 31.7 pg (ref 25.1–34.0)
MCHC: 31.6 g/dL (ref 31.5–36.0)
MCV: 100.5 fL (ref 79.5–101.0)
MONO#: 0.1 10*3/uL (ref 0.1–0.9)
MONO%: 0.8 % (ref 0.0–14.0)
NEUT#: 10.4 10*3/uL — ABNORMAL HIGH (ref 1.5–6.5)
NEUT%: 86.7 % — AB (ref 38.4–76.8)
PLATELETS: 246 10*3/uL (ref 145–400)
RBC: 3.97 10*6/uL (ref 3.70–5.45)
RDW: 14.5 % (ref 11.2–14.5)
WBC: 12 10*3/uL — ABNORMAL HIGH (ref 3.9–10.3)
lymph#: 1.4 10*3/uL (ref 0.9–3.3)

## 2016-11-05 NOTE — Progress Notes (Signed)
  [place] OFFICE PROGRESS NOTE   Diagnosis: Autoimmune hemolytic anemia  INTERVAL HISTORY:   Ms. Moise returns as scheduled. The hemoglobin dropped on the 12.5 mg dose of prednisone. The prednisone was increased to 20 mg daily beginning 10/11/2016. She reports mild exertional dyspnea. No other complaint.  Objective:  Vital signs in last 24 hours:  Blood pressure 133/68, pulse 76, temperature 98.7 F (37.1 C), temperature source Oral, resp. rate 19, height 5\' 4"  (1.626 m), weight 199 lb 9.6 oz (90.5 kg), SpO2 96 %.    HEENT: No thrush Lymphatics: No cervical or supraclavicular nodes Resp: Lungs clear bilaterally Cardio: Regular rate and rhythm GI: No hepatosplenomegaly Vascular: No leg edema  Lab Results:  Lab Results  Component Value Date   WBC 12.0 (H) 11/05/2016   HGB 12.6 11/05/2016   HCT 39.9 11/05/2016   MCV 100.5 11/05/2016   PLT 246 11/05/2016   NEUTROABS 10.4 (H) 11/05/2016     Medications: I have reviewed the patient's current medications.  Assessment/Plan: 1. 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, DAT IgG positive  Prednisone started 03/20/2016  Tapered to 15 mg daily on 04/30/2016  Hemoglobin lower, elevated LDH 05/21/2016-prednisone increased to 30 mgdaily  Prednisone taper to 25 mg daily 06/18/2016  Prednisone taper to 20 mg daily 07/16/2016  Prednisone taper to 15 mg daily 08/27/2016  Prednisone taper to 12.5 mg daily 09/25/2016  Hemoglobin lower, prednisone increased to 20 mg daily on 10/11/2016  2. Exertional dyspnea secondary to #1  3. Mild splenomegaly on CT 03/16/2016  4. History of polymyalgia rheumatica  5. History of giant cell/temporal arteritis-maintained on methotrexate in the remote past, now on low dose prednisone    Disposition:  Ms. Romick appears stable. The hemoglobin has increased on the 20 mg prednisone dose. We have been unable to taper the  prednisone to an acceptable chronic dose. I discussed treatment options with Ms. Burgett including rituximab and splenectomy. She agrees to a trial of rituximab. She will continue prednisone at a dose of 20 mg daily for now.  We reviewed the potential toxicities associated with rituximab including the chance of an allergic reaction, hematologic toxicity, infection, leukoencephalitis, and hepatitis reactivation. She agrees to proceed. She will attend a chemotherapy teaching class.  The plan is to begin rituximab on 11/19/2016. She will return for an office visit on 11/26/2016.  25 minutes were spent with the patient today. The majority of the time was used for counseling and coordination of care.  Betsy Coder, MD  11/05/2016  3:29 PM

## 2016-11-05 NOTE — Telephone Encounter (Signed)
Gave patient AVS and scheduled appt per 11/05/2016 los.

## 2016-11-08 ENCOUNTER — Telehealth: Payer: Self-pay | Admitting: *Deleted

## 2016-11-08 NOTE — Telephone Encounter (Signed)
Opened in error

## 2016-11-08 NOTE — Telephone Encounter (Signed)
Message received from patient wanting to know if prior Josem Kaufmann has been complete for her Rituxan scheduled for 11/19/16.  Message sent to LuAnn Chrismon and per [place] PA has not been complete yet.  Call placed back to patient and message left to inform her of above information and to call back next mid week and we would check again for her.  Instructed pt to call [facility] back with any questions or concerns.

## 2016-11-13 ENCOUNTER — Telehealth: Payer: Self-pay | Admitting: *Deleted

## 2016-11-13 ENCOUNTER — Other Ambulatory Visit: Payer: Medicare Other

## 2016-11-13 NOTE — Telephone Encounter (Signed)
Call from pt asking if prior auth has been submitted to her supplemental insurance company for chemo treatment. Medicare primary does not require prior auth. Will follow up with managed care.

## 2016-11-13 NOTE — Telephone Encounter (Signed)
Left message for pt: managed care rep recommended she contact supplemental insurance company to determine coverage. It does not appear to require prior auth.

## 2016-11-18 ENCOUNTER — Other Ambulatory Visit: Payer: Self-pay | Admitting: Oncology

## 2016-11-19 ENCOUNTER — Ambulatory Visit (HOSPITAL_BASED_OUTPATIENT_CLINIC_OR_DEPARTMENT_OTHER): Payer: Medicare Other

## 2016-11-19 ENCOUNTER — Ambulatory Visit: Payer: Medicare Other | Admitting: Licensed Clinical Social Worker

## 2016-11-19 ENCOUNTER — Other Ambulatory Visit (HOSPITAL_BASED_OUTPATIENT_CLINIC_OR_DEPARTMENT_OTHER): Payer: Medicare Other

## 2016-11-19 ENCOUNTER — Ambulatory Visit (HOSPITAL_BASED_OUTPATIENT_CLINIC_OR_DEPARTMENT_OTHER): Payer: Medicare Other | Admitting: Nurse Practitioner

## 2016-11-19 VITALS — BP 123/75 | HR 87 | Temp 99.1°F | Resp 18

## 2016-11-19 DIAGNOSIS — D599 Acquired hemolytic anemia, unspecified: Secondary | ICD-10-CM

## 2016-11-19 DIAGNOSIS — R51 Headache: Secondary | ICD-10-CM | POA: Diagnosis not present

## 2016-11-19 DIAGNOSIS — D591 Autoimmune hemolytic anemia, unspecified: Secondary | ICD-10-CM

## 2016-11-19 DIAGNOSIS — Z5112 Encounter for antineoplastic immunotherapy: Secondary | ICD-10-CM | POA: Diagnosis present

## 2016-11-19 DIAGNOSIS — T7840XA Allergy, unspecified, initial encounter: Secondary | ICD-10-CM

## 2016-11-19 LAB — CBC WITH DIFFERENTIAL/PLATELET
BASO%: 1 % (ref 0.0–2.0)
Basophils Absolute: 0.1 10*3/uL (ref 0.0–0.1)
EOS ABS: 0.1 10*3/uL (ref 0.0–0.5)
EOS%: 0.5 % (ref 0.0–7.0)
HCT: 38.4 % (ref 34.8–46.6)
HEMOGLOBIN: 12.6 g/dL (ref 11.6–15.9)
LYMPH#: 2.2 10*3/uL (ref 0.9–3.3)
LYMPH%: 17.2 % (ref 14.0–49.7)
MCH: 31.3 pg (ref 25.1–34.0)
MCHC: 32.8 g/dL (ref 31.5–36.0)
MCV: 95.2 fL (ref 79.5–101.0)
MONO#: 0.4 10*3/uL (ref 0.1–0.9)
MONO%: 3.3 % (ref 0.0–14.0)
NEUT#: 9.9 10*3/uL — ABNORMAL HIGH (ref 1.5–6.5)
NEUT%: 78 % — ABNORMAL HIGH (ref 38.4–76.8)
PLATELETS: 203 10*3/uL (ref 145–400)
RBC: 4.03 10*6/uL (ref 3.70–5.45)
RDW: 14.6 % — AB (ref 11.2–14.5)
WBC: 12.7 10*3/uL — ABNORMAL HIGH (ref 3.9–10.3)

## 2016-11-19 MED ORDER — SODIUM CHLORIDE 0.9 % IV SOLN
Freq: Once | INTRAVENOUS | Status: AC
  Administered 2016-11-19: 10:00:00 via INTRAVENOUS

## 2016-11-19 MED ORDER — DIPHENHYDRAMINE HCL 25 MG PO CAPS
50.0000 mg | ORAL_CAPSULE | Freq: Once | ORAL | Status: AC
  Administered 2016-11-19: 50 mg via ORAL

## 2016-11-19 MED ORDER — METHYLPREDNISOLONE SODIUM SUCC 125 MG IJ SOLR
125.0000 mg | Freq: Once | INTRAMUSCULAR | Status: AC | PRN
  Administered 2016-11-19: 125 mg via INTRAVENOUS

## 2016-11-19 MED ORDER — SODIUM CHLORIDE 0.9 % IV SOLN
375.0000 mg/m2 | Freq: Once | INTRAVENOUS | Status: AC
  Administered 2016-11-19: 800 mg via INTRAVENOUS
  Filled 2016-11-19: qty 50

## 2016-11-19 MED ORDER — ACETAMINOPHEN 325 MG PO TABS
650.0000 mg | ORAL_TABLET | Freq: Once | ORAL | Status: AC
  Administered 2016-11-19: 650 mg via ORAL

## 2016-11-19 MED ORDER — ACETAMINOPHEN 325 MG PO TABS
ORAL_TABLET | ORAL | Status: AC
  Filled 2016-11-19: qty 2

## 2016-11-19 MED ORDER — DIPHENHYDRAMINE HCL 25 MG PO CAPS
ORAL_CAPSULE | ORAL | Status: AC
  Filled 2016-11-19: qty 2

## 2016-11-19 NOTE — Patient Instructions (Addendum)
[facility] Discharge Instructions for Patients Receiving Chemotherapy  Today you received the following chemotherapy agent: Rituxan.   To help prevent nausea and vomiting after your treatment, we encourage you to take your nausea medication as prescribed.   If you develop nausea and vomiting that is not controlled by your nausea medication, call the clinic.   BELOW ARE SYMPTOMS THAT SHOULD BE REPORTED IMMEDIATELY:  *FEVER GREATER THAN 100.5 F  *CHILLS WITH OR WITHOUT FEVER  NAUSEA AND VOMITING THAT IS NOT CONTROLLED WITH YOUR NAUSEA MEDICATION  *UNUSUAL SHORTNESS OF BREATH  *UNUSUAL BRUISING OR BLEEDING  TENDERNESS IN MOUTH AND THROAT WITH OR WITHOUT PRESENCE OF ULCERS  *URINARY PROBLEMS  *BOWEL PROBLEMS  UNUSUAL RASH Items with * indicate a potential emergency and should be followed up as soon as possible.  Feel free to call the clinic you have any questions or concerns. The clinic phone number is [phone].  Please show the [facility] at check-in to the Emergency Department and triage nurse.  Rituximab injection What is this medicine? RITUXIMAB (ri TUX i mab) is a monoclonal antibody. It is used to treat certain types of cancer like non-Hodgkin lymphoma and chronic lymphocytic leukemia. It is also used to treat rheumatoid arthritis, granulomatosis with polyangiitis (or Wegener's granulomatosis), and microscopic polyangiitis. This medicine may be used for other purposes; ask your health care provider or pharmacist if you have questions. COMMON BRAND NAME(S): Rituxan What should I tell my health care provider before I take this medicine? They need to know if you have any of these conditions: -heart disease -infection (especially a virus infection such as hepatitis B, chickenpox, cold sores, or herpes) -immune system problems -irregular heartbeat -kidney disease -lung or breathing disease, like asthma -recently received or scheduled to receive a  vaccine -an unusual or allergic reaction to rituximab, mouse proteins, other medicines, foods, dyes, or preservatives -pregnant or trying to get pregnant -breast-feeding How should I use this medicine? This medicine is for infusion into a vein. It is administered in a hospital or clinic by a specially trained health care professional. A special MedGuide will be given to you by the pharmacist with each prescription and refill. Be sure to read this information carefully each time. Talk to your pediatrician regarding the use of this medicine in children. This medicine is not approved for use in children. Overdosage: If you think you have taken too much of this medicine contact a poison control center or emergency room at once. NOTE: This medicine is only for you. Do not share this medicine with others. What if I miss a dose? It is important not to miss a dose. Call your doctor or health care professional if you are unable to keep an appointment. What may interact with this medicine? -cisplatin -other medicines for arthritis like disease modifying antirheumatic drugs or tumor necrosis factor inhibitors -live virus vaccines This list may not describe all possible interactions. Give your health care provider a list of all the medicines, herbs, non-prescription drugs, or dietary supplements you use. Also tell them if you smoke, drink alcohol, or use illegal drugs. Some items may interact with your medicine. What should I watch for while using this medicine? Your condition will be monitored carefully while you are receiving this medicine. You may need blood work done while you are taking this medicine. This medicine can cause serious allergic reactions. To reduce your risk you may need to take medicine before treatment with this medicine. Take your medicine  as directed. In some patients, this medicine may cause a serious brain infection that may cause death. If you have any problems seeing, thinking,  speaking, walking, or standing, tell your doctor right away. If you cannot reach your doctor, urgently seek other source of medical care. Call your doctor or health care professional for advice if you get a fever, chills or sore throat, or other symptoms of a cold or flu. Do not treat yourself. This drug decreases your body's ability to fight infections. Try to avoid being around people who are sick. Do not become pregnant while taking this medicine or for 12 months after stopping it. Women should inform their doctor if they wish to become pregnant or think they might be pregnant. There is a potential for serious side effects to an unborn child. Talk to your health care professional or pharmacist for more information. What side effects may I notice from receiving this medicine? Side effects that you should report to your doctor or health care professional as soon as possible: -breathing problems -chest pain -dizziness or feeling faint -fast, irregular heartbeat -low blood counts - this medicine may decrease the number of white blood cells, red blood cells and platelets. You may be at increased risk for infections and bleeding. -mouth sores -redness, blistering, peeling or loosening of the skin, including inside the mouth (this can be added for any serious or exfoliative rash that could lead to hospitalization) -signs of infection - fever or chills, cough, sore throat, pain or difficulty passing urine -signs and symptoms of kidney injury like trouble passing urine or change in the amount of urine -signs and symptoms of liver injury like dark yellow or brown urine; general ill feeling or flu-like symptoms; light-colored stools; loss of appetite; nausea; right upper belly pain; unusually weak or tired; yellowing of the eyes or skin -stomach pain -vomiting Side effects that usually do not require medical attention (report to your doctor or health care professional if they continue or are  bothersome): -headache -joint pain -muscle cramps or muscle pain This list may not describe all possible side effects. Call your doctor for medical advice about side effects. You may report side effects to FDA at 1-800-FDA-1088. Where should I keep my medicine? This drug is given in a hospital or clinic and will not be stored at home. NOTE: This sheet is a summary. It may not cover all possible information. If you have questions about this medicine, talk to your doctor, pharmacist, or health care provider.  2018 Elsevier/Gold Standard (2016-03-14 15:28:09)

## 2016-11-19 NOTE — Progress Notes (Signed)
1205- Patient complains of headache and her neck feeling tight. Infusion was paused and normal saline ran. Cyndee Berniece Salines was paged to chairside and ordered solumedrol 125. VS stable. Within 5 minutes the symptoms above subsided. Infusion was restarted and patient did not experience any further complication.

## 2016-11-20 ENCOUNTER — Encounter: Payer: Self-pay | Admitting: Pharmacist

## 2016-11-20 ENCOUNTER — Encounter: Payer: Self-pay | Admitting: Nurse Practitioner

## 2016-11-20 DIAGNOSIS — D591 Autoimmune hemolytic anemia, unspecified: Secondary | ICD-10-CM | POA: Insufficient documentation

## 2016-11-20 DIAGNOSIS — T7840XA Allergy, unspecified, initial encounter: Secondary | ICD-10-CM | POA: Insufficient documentation

## 2016-11-20 LAB — HEPATITIS B CORE ANTIBODY, TOTAL: HEP B C TOTAL AB: NEGATIVE

## 2016-11-20 LAB — HEPATITIS B SURFACE ANTIGEN: HBsAg Screen: NEGATIVE

## 2016-11-20 NOTE — Assessment & Plan Note (Signed)
Patient presented to the [facility] today to receive her first cycle of Rituxan infusion.  She developed a mild hypersensitivity reaction which consisted of a headache and feeling that her neck was tight.  The infusion was held and patient was given Solu-Medrol 125 mg IV per protocol.  Also confirmed the patient received all of her medications as directed as well.  All symptoms resolve; and patient was able to complete her Rituxan infusion with no further issues whatsoever.

## 2016-11-20 NOTE — Progress Notes (Signed)
SYMPTOM MANAGEMENT CLINIC    Chief Complaint: Hypersensitivity reaction  HPI:  Michelle Wilcox 71 y.o. female diagnosed with autoimmune hemolytic anemia.  Currently undergoing Rituxan infusions.    No history exists.    Review of Systems  Musculoskeletal: Positive for myalgias and neck pain.  All other systems reviewed and are negative.   Past Medical History:  Diagnosis Date  . Anxiety   . Depression   . Giant cell arteritis ([place])   . Gouty arthropathy   . Hyperlipidemia   . Hypertension   . Migraines   . Polymyalgia ([place])     Past Surgical History:  Procedure Laterality Date  . BARTHOLIN GLAND CYST EXCISION     non ca  . EYE MUSCLE SURGERY     as a child 18 yrs old  . KNEE SURGERY  2004   rt knee  . toncil    . TONSILLECTOMY     as a child    has Breast mass, right; Anemia; Autoimmune hemolytic anemia (HCC); and Hypersensitivity reaction on her problem list.    is allergic to penicillins; ceftin [cefuroxime axetil]; ciprofloxacin; darvocet [propoxyphene n-acetaminophen]; levaquin [levofloxacin in d5w]; sulfa antibiotics; and zyrtec [cetirizine hcl].  Allergies as of 11/19/2016      Reactions   Penicillins Anaphylaxis   Ceftin [cefuroxime Axetil]    Ciprofloxacin    Darvocet [propoxyphene N-acetaminophen]    Levaquin [levofloxacin In D5w] Hives   Sulfa Antibiotics Hives   Zyrtec [cetirizine Hcl]    headache      Medication List       Accurate as of 11/19/16 11:59 PM. Always use your most recent med list.          acetaminophen 650 MG CR tablet Commonly known as:  TYLENOL Take 650 mg by mouth every 8 (eight) hours as needed for pain. Two in AM and two in PM.   cholecalciferol 1000 units tablet Commonly known as:  VITAMIN D Take 400 Units by mouth daily.   clonazePAM 0.5 MG tablet Commonly known as:  KLONOPIN Take 0.25-0.5 mg by mouth 2 (two) times daily as needed for anxiety.   diphenhydrAMINE 25 mg capsule Commonly known as:   BENADRYL Take 25 mg by mouth at bedtime as needed.   folic acid 1 MG tablet Commonly known as:  FOLVITE TAKE 1 TABLET BY MOUTH DAILY   GAS-X 80 MG chewable tablet Generic drug:  simethicone Chew 80 mg by mouth every 6 (six) hours as needed.   hydrochlorothiazide 25 MG tablet Commonly known as:  HYDRODIURIL Take 12.5 mg by mouth daily.   loperamide 2 MG capsule Commonly known as:  IMODIUM Take by mouth as needed for diarrhea or loose stools.   metoprolol 50 MG tablet Commonly known as:  LOPRESSOR Take 50 mg by mouth 2 (two) times daily. Takes 50 mg in AM and 25 mg in PM   multivitamin tablet Take 1 tablet by mouth daily.   omeprazole 20 MG tablet Commonly known as:  PRILOSEC OTC Take 20 mg by mouth daily.   predniSONE 10 MG tablet Commonly known as:  DELTASONE Take 2 tablets (20 mg total) by mouth daily with breakfast.        PHYSICAL EXAMINATION  Oncology Vitals 11/19/2016 11/19/2016  Height - -  Weight - -  Weight (lbs) - -  BMI (kg/m2) - -  Temp 99.1 98.7  Pulse 87 94  Resp 18 18  SpO2 98 98  BSA (m2) - -  BP Readings from Last 2 Encounters:  11/19/16 123/75  11/05/16 133/68    Physical Exam  Constitutional: She is oriented to person, place, and time and well-developed, well-nourished, and in no distress.  HENT:  Head: Normocephalic and atraumatic.  Mouth/Throat: Oropharynx is clear and moist.  Eyes: Conjunctivae and EOM are normal. Pupils are equal, round, and reactive to light. Right eye exhibits no discharge. Left eye exhibits no discharge. No scleral icterus.  Neck: Normal range of motion. Neck supple. No JVD present. No tracheal deviation present. No thyromegaly present.  Cardiovascular: Normal rate, regular rhythm, normal heart sounds and intact distal pulses.   Pulmonary/Chest: Effort normal and breath sounds normal. No respiratory distress. She has no wheezes. She has no rales. She exhibits no tenderness.  Abdominal: Soft. Bowel sounds are  normal. She exhibits no distension and no mass. There is no tenderness. There is no rebound and no guarding.  Musculoskeletal: Normal range of motion. She exhibits no edema or tenderness.  Lymphadenopathy:    She has no cervical adenopathy.  Neurological: She is alert and oriented to person, place, and time. Gait normal.  Skin: Skin is warm and dry. No rash noted. No erythema. No pallor.  Psychiatric: Affect normal.  Nursing note and vitals reviewed.   LABORATORY DATA:. Appointment on 11/19/2016  Component Date Value Ref Range Status  . WBC 11/19/2016 12.7* 3.9 - 10.3 10e3/uL Final  . NEUT# 11/19/2016 9.9* 1.5 - 6.5 10e3/uL Final  . HGB 11/19/2016 12.6  11.6 - 15.9 g/dL Final  . HCT 11/19/2016 38.4  34.8 - 46.6 % Final  . Platelets 11/19/2016 203  145 - 400 10e3/uL Final  . MCV 11/19/2016 95.2  79.5 - 101.0 fL Final  . MCH 11/19/2016 31.3  25.1 - 34.0 pg Final  . MCHC 11/19/2016 32.8  31.5 - 36.0 g/dL Final  . RBC 11/19/2016 4.03  3.70 - 5.45 10e6/uL Final  . RDW 11/19/2016 14.6* 11.2 - 14.5 % Final  . lymph# 11/19/2016 2.2  0.9 - 3.3 10e3/uL Final  . MONO# 11/19/2016 0.4  0.1 - 0.9 10e3/uL Final  . Eosinophils Absolute 11/19/2016 0.1  0.0 - 0.5 10e3/uL Final  . Basophils Absolute 11/19/2016 0.1  0.0 - 0.1 10e3/uL Final  . NEUT% 11/19/2016 78.0* 38.4 - 76.8 % Final  . LYMPH% 11/19/2016 17.2  14.0 - 49.7 % Final  . MONO% 11/19/2016 3.3  0.0 - 14.0 % Final  . EOS% 11/19/2016 0.5  0.0 - 7.0 % Final  . BASO% 11/19/2016 1.0  0.0 - 2.0 % Final  . Hep B Core Ab, Tot 11/19/2016 Negative  Negative Final  . HBsAg Screen 11/19/2016 Negative  Negative Final    RADIOGRAPHIC STUDIES: No results found.  ASSESSMENT/PLAN:    Hypersensitivity reaction Patient presented to the [facility] today to receive her first cycle of Rituxan infusion.  She developed a mild hypersensitivity reaction which consisted of a headache and feeling that her neck was tight.  The infusion was held and patient was  given Solu-Medrol 125 mg IV per protocol.  Also confirmed the patient received all of her medications as directed as well.  All symptoms resolve; and patient was able to complete her Rituxan infusion with no further issues whatsoever.  Autoimmune hemolytic anemia ([place]) Patient presented to the [facility] today to receive her first cycle of Rituxan infusion.  She developed a mild hypersensitivity reaction which was managed per protocol.  Patient is scheduled to return on 11/26/2016 for labs, visit, and  her next cycle of Rituxan.   Patient stated understanding of all instructions; and was in agreement with this plan of care. The patient knows to call the clinic with any problems, questions or concerns.   Total time spent with patient was 15 minutes;  with greater than 75 percent of that time spent in face to face counseling regarding patient's symptoms,  and coordination of care and follow up.  Disclaimer:This dictation was prepared with Dragon/digital dictation along with [employer]. Any transcriptional errors that result from this process are unintentional.  Drue Second, NP 11/20/2016

## 2016-11-20 NOTE — Assessment & Plan Note (Signed)
Patient presented to the [facility] today to receive her first cycle of Rituxan infusion.  She developed a mild hypersensitivity reaction which was managed per protocol.  Patient is scheduled to return on 11/26/2016 for labs, visit, and her next cycle of Rituxan.

## 2016-11-24 ENCOUNTER — Other Ambulatory Visit: Payer: Self-pay | Admitting: Oncology

## 2016-11-26 ENCOUNTER — Ambulatory Visit (HOSPITAL_BASED_OUTPATIENT_CLINIC_OR_DEPARTMENT_OTHER): Payer: Medicare Other

## 2016-11-26 ENCOUNTER — Ambulatory Visit (HOSPITAL_BASED_OUTPATIENT_CLINIC_OR_DEPARTMENT_OTHER): Payer: Medicare Other | Admitting: Oncology

## 2016-11-26 ENCOUNTER — Other Ambulatory Visit (HOSPITAL_BASED_OUTPATIENT_CLINIC_OR_DEPARTMENT_OTHER): Payer: Medicare Other

## 2016-11-26 ENCOUNTER — Telehealth: Payer: Self-pay | Admitting: Oncology

## 2016-11-26 VITALS — BP 132/76 | HR 81 | Temp 98.9°F | Resp 16

## 2016-11-26 VITALS — BP 142/61 | HR 85 | Temp 98.5°F | Resp 17 | Ht 64.0 in | Wt 201.6 lb

## 2016-11-26 DIAGNOSIS — D591 Autoimmune hemolytic anemia, unspecified: Secondary | ICD-10-CM

## 2016-11-26 DIAGNOSIS — Z5112 Encounter for antineoplastic immunotherapy: Secondary | ICD-10-CM | POA: Diagnosis not present

## 2016-11-26 DIAGNOSIS — D599 Acquired hemolytic anemia, unspecified: Secondary | ICD-10-CM

## 2016-11-26 LAB — CBC & DIFF AND RETIC
BASO%: 0.3 % (ref 0.0–2.0)
BASOS ABS: 0 10*3/uL (ref 0.0–0.1)
EOS%: 0.6 % (ref 0.0–7.0)
Eosinophils Absolute: 0.1 10*3/uL (ref 0.0–0.5)
HEMATOCRIT: 38.6 % (ref 34.8–46.6)
HGB: 12.7 g/dL (ref 11.6–15.9)
Immature Retic Fract: 4.7 % (ref 1.60–10.00)
LYMPH%: 5.2 % — AB (ref 14.0–49.7)
MCH: 31.7 pg (ref 25.1–34.0)
MCHC: 32.9 g/dL (ref 31.5–36.0)
MCV: 96.3 fL (ref 79.5–101.0)
MONO#: 0.4 10*3/uL (ref 0.1–0.9)
MONO%: 3 % (ref 0.0–14.0)
NEUT#: 12 10*3/uL — ABNORMAL HIGH (ref 1.5–6.5)
NEUT%: 90.9 % — AB (ref 38.4–76.8)
PLATELETS: 233 10*3/uL (ref 145–400)
RBC: 4.01 10*6/uL (ref 3.70–5.45)
RDW: 14.3 % (ref 11.2–14.5)
Retic %: 4.06 % — ABNORMAL HIGH (ref 0.70–2.10)
Retic Ct Abs: 162.81 10*3/uL — ABNORMAL HIGH (ref 33.70–90.70)
WBC: 13.2 10*3/uL — ABNORMAL HIGH (ref 3.9–10.3)
lymph#: 0.7 10*3/uL — ABNORMAL LOW (ref 0.9–3.3)

## 2016-11-26 MED ORDER — METHYLPREDNISOLONE SODIUM SUCC 125 MG IJ SOLR
125.0000 mg | Freq: Once | INTRAMUSCULAR | Status: AC
  Administered 2016-11-26: 125 mg via INTRAVENOUS

## 2016-11-26 MED ORDER — SODIUM CHLORIDE 0.9 % IV SOLN: 375.0000 mg/m2 | Freq: Once | INTRAVENOUS | Status: DC

## 2016-11-26 MED ORDER — DIPHENHYDRAMINE HCL 25 MG PO CAPS
ORAL_CAPSULE | ORAL | Status: AC
  Filled 2016-11-26: qty 2

## 2016-11-26 MED ORDER — METHYLPREDNISOLONE SODIUM SUCC 125 MG IJ SOLR
INTRAMUSCULAR | Status: AC
  Filled 2016-11-26: qty 2

## 2016-11-26 MED ORDER — DIPHENHYDRAMINE HCL 25 MG PO CAPS
50.0000 mg | ORAL_CAPSULE | Freq: Once | ORAL | Status: AC
  Administered 2016-11-26: 50 mg via ORAL

## 2016-11-26 MED ORDER — ACETAMINOPHEN 325 MG PO TABS
ORAL_TABLET | ORAL | Status: AC
  Filled 2016-11-26: qty 2

## 2016-11-26 MED ORDER — SODIUM CHLORIDE 0.9 % IV SOLN
Freq: Once | INTRAVENOUS | Status: AC
  Administered 2016-11-26: 11:00:00 via INTRAVENOUS

## 2016-11-26 MED ORDER — SODIUM CHLORIDE 0.9 % IV SOLN
375.0000 mg/m2 | Freq: Once | INTRAVENOUS | Status: AC
  Administered 2016-11-26: 800 mg via INTRAVENOUS
  Filled 2016-11-26: qty 50

## 2016-11-26 MED ORDER — ACETAMINOPHEN 325 MG PO TABS
650.0000 mg | ORAL_TABLET | Freq: Once | ORAL | Status: AC
  Administered 2016-11-26: 650 mg via ORAL

## 2016-11-26 NOTE — Progress Notes (Signed)
  Michelle Wilcox   Diagnosis: Autoimmune hemolytic anemia  INTERVAL HISTORY:   Michelle Wilcox returns as scheduled. She continues prednisone at a dose of 20 mg daily. She completed a first treatment with rituximab on 11/19/2016. She developed throat and "years "tightness during the rituximab infusion. The infusion was stopped symptoms resolved within a few minutes. She was treated with Solu-Medrol and completed the infusion without further symptoms of an allergic reaction. She reports feeling well. She had diarrhea on 11/24/2016 after eating a large breakfast. No other complaint.  Objective:  Vital signs in last 24 hours:  Blood pressure (!) 142/61, pulse 85, temperature 98.5 F (36.9 C), temperature source Oral, resp. rate 17, height 5\' 4"  (1.626 m), weight 201 lb 9.6 oz (91.4 kg), SpO2 96 %.    HEENT: No thrush Resp: Lungs clear bilaterally Cardio: Regular rate and rhythm GI: No hepatosplenomegaly, tender at the right costal margin Vascular: No leg edema   Lab Results:  Lab Results  Component Value Date   WBC 13.2 (H) 11/26/2016   HGB 12.7 11/26/2016   HCT 38.6 11/26/2016   MCV 96.3 11/26/2016   PLT 233 11/26/2016   NEUTROABS 12.0 (H) 11/26/2016     Medications: I have reviewed the patient's current medications.  Assessment/Plan: 1. 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, DAT IgG positive  Prednisone started 03/20/2016  Tapered to 15 mg daily on 04/30/2016  Hemoglobin lower, elevated LDH 05/21/2016-prednisone increased to 30 mgdaily  Prednisone taper to 25 mg daily 06/18/2016  Prednisone taper to 20 mg daily 07/16/2016  Prednisone taper to 15 mg daily 08/27/2016  Prednisone taper to 12.5 mg daily 09/25/2016  Hemoglobin lower, prednisone increased to 20 mg daily on 10/11/2016  Cycle 1 rituximab 11/19/2016  Cycle 2 rituximab 11/26/2016  2. Exertional dyspnea secondary to #1  3.  Mild splenomegaly on CT 03/16/2016  4. History of polymyalgia rheumatica  5. History of giant cell/temporal arteritis-maintained on methotrexate in the remote past, now on low dose prednisone    Disposition:  She appears stable. She had symptoms of an allergic reaction during the first rituximab infusion, but was able to complete the infusion after Solu-Medrol. The hemoglobin is stable. She will continue prednisone at a dose of 20,000,000 g daily. Michelle Wilcox will complete cycle 2 rituximab today. She will return for cycle 3 rituximab on 12/10/2016. She'll be scheduled for office and lab visit with the final cycle of rituximab on 12/17/2016. We will begin tapering the prednisone if the hemoglobin is stable on 12/17/2016.  I will add Solu-Medrol to the rituximab infusion today.  Betsy Coder, MD  11/26/2016  9:54 AM

## 2016-11-26 NOTE — Patient Instructions (Signed)
[facility] Discharge Instructions for Patients Receiving Chemotherapy  Today you received the following chemotherapy agent: Rituxan.   To help prevent nausea and vomiting after your treatment, we encourage you to take your nausea medication as prescribed.   If you develop nausea and vomiting that is not controlled by your nausea medication, call the clinic.   BELOW ARE SYMPTOMS THAT SHOULD BE REPORTED IMMEDIATELY:  *FEVER GREATER THAN 100.5 F  *CHILLS WITH OR WITHOUT FEVER  NAUSEA AND VOMITING THAT IS NOT CONTROLLED WITH YOUR NAUSEA MEDICATION  *UNUSUAL SHORTNESS OF BREATH  *UNUSUAL BRUISING OR BLEEDING  TENDERNESS IN MOUTH AND THROAT WITH OR WITHOUT PRESENCE OF ULCERS  *URINARY PROBLEMS  *BOWEL PROBLEMS  UNUSUAL RASH Items with * indicate a potential emergency and should be followed up as soon as possible.  Feel free to call the clinic you have any questions or concerns. The clinic phone number is [phone].  Please show the [place] at check-in to the Emergency Department and triage nurse.  Rituximab injection What is this medicine? RITUXIMAB (ri TUX i mab) is a monoclonal antibody. It is used to treat certain types of cancer like non-Hodgkin lymphoma and chronic lymphocytic leukemia. It is also used to treat rheumatoid arthritis, granulomatosis with polyangiitis (or Wegener's granulomatosis), and microscopic polyangiitis. This medicine may be used for other purposes; ask your health care provider or pharmacist if you have questions. COMMON BRAND NAME(S): Rituxan What should I tell my health care provider before I take this medicine? They need to know if you have any of these conditions: -heart disease -infection (especially a virus infection such as hepatitis B, chickenpox, cold sores, or herpes) -immune system problems -irregular heartbeat -kidney disease -lung or breathing disease, like asthma -recently received or scheduled to receive a  vaccine -an unusual or allergic reaction to rituximab, mouse proteins, other medicines, foods, dyes, or preservatives -pregnant or trying to get pregnant -breast-feeding How should I use this medicine? This medicine is for infusion into a vein. It is administered in a hospital or clinic by a specially trained health care professional. A special MedGuide will be given to you by the pharmacist with each prescription and refill. Be sure to read this information carefully each time. Talk to your pediatrician regarding the use of this medicine in children. This medicine is not approved for use in children. Overdosage: If you think you have taken too much of this medicine contact a poison control center or emergency room at once. NOTE: This medicine is only for you. Do not share this medicine with others. What if I miss a dose? It is important not to miss a dose. Call your doctor or health care professional if you are unable to keep an appointment. What may interact with this medicine? -cisplatin -other medicines for arthritis like disease modifying antirheumatic drugs or tumor necrosis factor inhibitors -live virus vaccines This list may not describe all possible interactions. Give your health care provider a list of all the medicines, herbs, non-prescription drugs, or dietary supplements you use. Also tell them if you smoke, drink alcohol, or use illegal drugs. Some items may interact with your medicine. What should I watch for while using this medicine? Your condition will be monitored carefully while you are receiving this medicine. You may need blood work done while you are taking this medicine. This medicine can cause serious allergic reactions. To reduce your risk you may need to take medicine before treatment with this medicine. Take your medicine  as directed. In some patients, this medicine may cause a serious brain infection that may cause death. If you have any problems seeing, thinking,  speaking, walking, or standing, tell your doctor right away. If you cannot reach your doctor, urgently seek other source of medical care. Call your doctor or health care professional for advice if you get a fever, chills or sore throat, or other symptoms of a cold or flu. Do not treat yourself. This drug decreases your body's ability to fight infections. Try to avoid being around people who are sick. Do not become pregnant while taking this medicine or for 12 months after stopping it. Women should inform their doctor if they wish to become pregnant or think they might be pregnant. There is a potential for serious side effects to an unborn child. Talk to your health care professional or pharmacist for more information. What side effects may I notice from receiving this medicine? Side effects that you should report to your doctor or health care professional as soon as possible: -breathing problems -chest pain -dizziness or feeling faint -fast, irregular heartbeat -low blood counts - this medicine may decrease the number of white blood cells, red blood cells and platelets. You may be at increased risk for infections and bleeding. -mouth sores -redness, blistering, peeling or loosening of the skin, including inside the mouth (this can be added for any serious or exfoliative rash that could lead to hospitalization) -signs of infection - fever or chills, cough, sore throat, pain or difficulty passing urine -signs and symptoms of kidney injury like trouble passing urine or change in the amount of urine -signs and symptoms of liver injury like dark yellow or brown urine; general ill feeling or flu-like symptoms; light-colored stools; loss of appetite; nausea; right upper belly pain; unusually weak or tired; yellowing of the eyes or skin -stomach pain -vomiting Side effects that usually do not require medical attention (report to your doctor or health care professional if they continue or are  bothersome): -headache -joint pain -muscle cramps or muscle pain This list may not describe all possible side effects. Call your doctor for medical advice about side effects. You may report side effects to FDA at 1-800-FDA-1088. Where should I keep my medicine? This drug is given in a hospital or clinic and will not be stored at home. NOTE: This sheet is a summary. It may not cover all possible information. If you have questions about this medicine, talk to your doctor, pharmacist, or health care provider.  2018 Elsevier/Gold Standard (2016-03-14 15:28:09)

## 2016-11-26 NOTE — Telephone Encounter (Signed)
Appointments scheduled per 11/26/16 los. Patient was given a copy of the AVS report and appointment schedule per 11/26/16 los. °

## 2016-11-29 ENCOUNTER — Other Ambulatory Visit: Payer: Self-pay | Admitting: Oncology

## 2016-12-02 ENCOUNTER — Other Ambulatory Visit: Payer: Self-pay | Admitting: Oncology

## 2016-12-03 ENCOUNTER — Ambulatory Visit (HOSPITAL_BASED_OUTPATIENT_CLINIC_OR_DEPARTMENT_OTHER): Payer: Medicare Other

## 2016-12-03 VITALS — BP 135/59 | HR 77 | Temp 98.3°F | Resp 17

## 2016-12-03 DIAGNOSIS — Z5112 Encounter for antineoplastic immunotherapy: Secondary | ICD-10-CM | POA: Diagnosis present

## 2016-12-03 DIAGNOSIS — D591 Other autoimmune hemolytic anemias: Secondary | ICD-10-CM

## 2016-12-03 DIAGNOSIS — D599 Acquired hemolytic anemia, unspecified: Secondary | ICD-10-CM

## 2016-12-03 MED ORDER — DIPHENHYDRAMINE HCL 25 MG PO CAPS
ORAL_CAPSULE | ORAL | Status: AC
  Filled 2016-12-03: qty 2

## 2016-12-03 MED ORDER — METHYLPREDNISOLONE SODIUM SUCC 125 MG IJ SOLR
125.0000 mg | Freq: Once | INTRAMUSCULAR | Status: AC
  Administered 2016-12-03: 125 mg via INTRAVENOUS

## 2016-12-03 MED ORDER — SODIUM CHLORIDE 0.9 % IV SOLN
Freq: Once | INTRAVENOUS | Status: AC
  Administered 2016-12-03: 08:00:00 via INTRAVENOUS

## 2016-12-03 MED ORDER — ACETAMINOPHEN 325 MG PO TABS
650.0000 mg | ORAL_TABLET | Freq: Once | ORAL | Status: AC
  Administered 2016-12-03: 650 mg via ORAL

## 2016-12-03 MED ORDER — DIPHENHYDRAMINE HCL 25 MG PO CAPS
50.0000 mg | ORAL_CAPSULE | Freq: Once | ORAL | Status: AC
  Administered 2016-12-03: 50 mg via ORAL

## 2016-12-03 MED ORDER — SODIUM CHLORIDE 0.9 % IV SOLN
375.0000 mg/m2 | Freq: Once | INTRAVENOUS | Status: AC
  Administered 2016-12-03: 800 mg via INTRAVENOUS
  Filled 2016-12-03: qty 50

## 2016-12-03 MED ORDER — ACETAMINOPHEN 325 MG PO TABS
ORAL_TABLET | ORAL | Status: AC
  Filled 2016-12-03: qty 2

## 2016-12-03 MED ORDER — METHYLPREDNISOLONE SODIUM SUCC 125 MG IJ SOLR
INTRAMUSCULAR | Status: AC
  Filled 2016-12-03: qty 2

## 2016-12-03 NOTE — Patient Instructions (Signed)
[facility] Discharge Instructions for Patients Receiving Chemotherapy  Today you received the following chemotherapy agents: Rituxan   To help prevent nausea and vomiting after your treatment, we encourage you to take your nausea medication as directed.    If you develop nausea and vomiting that is not controlled by your nausea medication, call the clinic.   BELOW ARE SYMPTOMS THAT SHOULD BE REPORTED IMMEDIATELY:  *FEVER GREATER THAN 100.5 F  *CHILLS WITH OR WITHOUT FEVER  NAUSEA AND VOMITING THAT IS NOT CONTROLLED WITH YOUR NAUSEA MEDICATION  *UNUSUAL SHORTNESS OF BREATH  *UNUSUAL BRUISING OR BLEEDING  TENDERNESS IN MOUTH AND THROAT WITH OR WITHOUT PRESENCE OF ULCERS  *URINARY PROBLEMS  *BOWEL PROBLEMS  UNUSUAL RASH Items with * indicate a potential emergency and should be followed up as soon as possible.  Feel free to call the clinic you have any questions or concerns. The clinic phone number is [phone].  Please show the CHEMO ALERT CARD at check-in to the Emergency Department and triage nurse.   

## 2016-12-03 NOTE — Progress Notes (Signed)
Pt tolerated infusion well, pt and VS stable at discharge.  

## 2016-12-04 ENCOUNTER — Telehealth: Payer: Self-pay | Admitting: Medical Oncology

## 2016-12-04 NOTE — Telephone Encounter (Signed)
She had a clump of hair to fall out and she wanted to know if methylprednisone cause hair loss. I told her I was not aware that this was a side effect of the drug.

## 2016-12-05 ENCOUNTER — Telehealth: Payer: Self-pay | Admitting: *Deleted

## 2016-12-05 NOTE — Telephone Encounter (Signed)
"  I received my third Rituxan Monday.  Had a reaction to previous treatment so it wan slowly.  Monday was run faster and I'm having problems.  I'm having trouble sleeping, have a few clear blisters inside mouth on right, the lip is numb exteriorly along with feeling extremely fatigued.  I do not have a fever.  I know I received solumedrol.  My next scheduled F/U and treatment is 12-17-2016.  I would like the treatment to be given slowly every time I receive it if possible.  I'm sharing how I feel not because I need anything but so you all know I think the increased rate contributed to my symptoms.  I am at work at [employer] today and having trouble doing my work but [employer] here."  Encouraged to call if changes mind about symptoms reported or after hours nurse team available after 4:30 pm for any emergencies.

## 2016-12-15 DIAGNOSIS — J45998 Other asthma: Secondary | ICD-10-CM | POA: Diagnosis not present

## 2016-12-16 ENCOUNTER — Other Ambulatory Visit: Payer: Self-pay | Admitting: Oncology

## 2016-12-17 ENCOUNTER — Encounter: Payer: Self-pay | Admitting: *Deleted

## 2016-12-17 ENCOUNTER — Encounter (HOSPITAL_COMMUNITY): Payer: Self-pay

## 2016-12-17 ENCOUNTER — Other Ambulatory Visit: Payer: Self-pay

## 2016-12-17 ENCOUNTER — Emergency Department (HOSPITAL_COMMUNITY): Payer: Medicare Other

## 2016-12-17 ENCOUNTER — Ambulatory Visit: Payer: Self-pay | Admitting: Oncology

## 2016-12-17 ENCOUNTER — Ambulatory Visit: Payer: Self-pay

## 2016-12-17 ENCOUNTER — Emergency Department (HOSPITAL_COMMUNITY)
Admission: EM | Admit: 2016-12-17 | Discharge: 2016-12-17 | Disposition: A | Discharge status: Home / Self Care | Payer: Medicare Other | Attending: Emergency Medicine | Admitting: Emergency Medicine

## 2016-12-17 DIAGNOSIS — I10 Essential (primary) hypertension: Secondary | ICD-10-CM | POA: Diagnosis not present

## 2016-12-17 DIAGNOSIS — J40 Bronchitis, not specified as acute or chronic: Secondary | ICD-10-CM | POA: Diagnosis not present

## 2016-12-17 DIAGNOSIS — R0602 Shortness of breath: Secondary | ICD-10-CM | POA: Diagnosis present

## 2016-12-17 DIAGNOSIS — R05 Cough: Secondary | ICD-10-CM | POA: Diagnosis not present

## 2016-12-17 LAB — CBC WITH DIFFERENTIAL/PLATELET
BASOS PCT: 1 %
Basophils Absolute: 0.1 10*3/uL (ref 0.0–0.1)
EOS PCT: 1 %
Eosinophils Absolute: 0.1 10*3/uL (ref 0.0–0.7)
HCT: 36.8 % (ref 36.0–46.0)
Hemoglobin: 12 g/dL (ref 12.0–15.0)
Lymphocytes Relative: 12 %
Lymphs Abs: 1.6 10*3/uL (ref 0.7–4.0)
MCH: 30.7 pg (ref 26.0–34.0)
MCHC: 32.6 g/dL (ref 30.0–36.0)
MCV: 94.1 fL (ref 78.0–100.0)
MONO ABS: 0.6 10*3/uL (ref 0.1–1.0)
Monocytes Relative: 5 %
Neutro Abs: 10.9 10*3/uL — ABNORMAL HIGH (ref 1.7–7.7)
Neutrophils Relative %: 81 %
PLATELETS: 258 10*3/uL (ref 150–400)
RBC: 3.91 MIL/uL (ref 3.87–5.11)
RDW: 14.5 % (ref 11.5–15.5)
WBC: 13.3 10*3/uL — ABNORMAL HIGH (ref 4.0–10.5)

## 2016-12-17 LAB — BASIC METABOLIC PANEL
ANION GAP: 11 (ref 5–15)
BUN: 26 mg/dL — ABNORMAL HIGH (ref 6–20)
CALCIUM: 9 mg/dL (ref 8.9–10.3)
CO2: 30 mmol/L (ref 22–32)
Chloride: 100 mmol/L — ABNORMAL LOW (ref 101–111)
Creatinine, Ser: 0.7 mg/dL (ref 0.44–1.00)
GLUCOSE: 90 mg/dL (ref 65–99)
Potassium: 3 mmol/L — ABNORMAL LOW (ref 3.5–5.1)
Sodium: 141 mmol/L (ref 135–145)

## 2016-12-17 LAB — BRAIN NATRIURETIC PEPTIDE: B NATRIURETIC PEPTIDE 5: 83.3 pg/mL (ref 0.0–100.0)

## 2016-12-17 LAB — D-DIMER, QUANTITATIVE: D-Dimer, Quant: 0.52 ug/mL-FEU — ABNORMAL HIGH (ref 0.00–0.50)

## 2016-12-17 MED ORDER — GUAIFENESIN-DM 100-10 MG/5ML PO SYRP: 5.0000 mL | ORAL_SOLUTION | ORAL | 0 refills | Status: DC | PRN

## 2016-12-17 MED ORDER — DOXYCYCLINE HYCLATE 100 MG PO CAPS: 100.0000 mg | ORAL_CAPSULE | Freq: Two times a day (BID) | ORAL | 0 refills | Status: DC

## 2016-12-17 MED ORDER — GUAIFENESIN-DM 100-10 MG/5ML PO SYRP
5.0000 mL | ORAL_SOLUTION | Freq: Once | ORAL | Status: AC
  Administered 2016-12-17: 5 mL via ORAL
  Filled 2016-12-17: qty 10

## 2016-12-17 MED ORDER — IPRATROPIUM-ALBUTEROL 0.5-2.5 (3) MG/3ML IN SOLN
3.0000 mL | Freq: Once | RESPIRATORY_TRACT | Status: AC
  Administered 2016-12-17: 3 mL via RESPIRATORY_TRACT
  Filled 2016-12-17: qty 3

## 2016-12-17 MED ORDER — POTASSIUM CHLORIDE CRYS ER 20 MEQ PO TBCR
40.0000 meq | EXTENDED_RELEASE_TABLET | Freq: Once | ORAL | Status: AC
  Administered 2016-12-17: 40 meq via ORAL
  Filled 2016-12-17: qty 2

## 2016-12-17 NOTE — ED Notes (Signed)
Pt went to walk-in clinic Saturday the 28th and was dx with asthma, given albuterol inhaler and codeine cough syrup. Pt c/o cough, shortness of breath, and back pain.

## 2016-12-17 NOTE — ED Triage Notes (Signed)
States seen at clinic on Saturday and dx with asthma given albuterol inhaler and sent home with non productive cough no fever states she has wheezing.

## 2016-12-17 NOTE — Discharge Instructions (Signed)
Take the antibiotics and your steroids as prescribed. Follow up with your doctor. Return to the ED if you develop new or worsening symptoms.

## 2016-12-17 NOTE — ED Provider Notes (Signed)
[facility] DEPT Provider Note   CSN: [id number] Arrival date & time: 12/17/16  2297     History   Chief Complaint Chief Complaint  Patient presents with  . Shortness of Breath    HPI Michelle Wilcox is a 71 y.o. female.  Patient presents with a six-day history of shortness of breath, cough and "wheezing.". She was seen in urgent care 2 days ago diagnosed with "asthma". She was given an albuterol inhaler as well as cough medicine with codeine. She's been using inhaler every 6 hours came in tonight because of shortness of breath this getting worse and she is still coughing. She denies any fever or chest pain. She has low back pain from coughing. No focal weakness, numbness or tingling. No bowel or bladder incontinence. Patient is not a smoker. She has a history of autoimmune hemolytic anemia and is on chronic steroids of 20 mg per day of prednisone. She also has a history of polymyalgia and giant cell arteritis. She may have been told she had asthma when she was younger but is not on any regular breathing medication.   The history is provided by the patient.  Shortness of Breath  Associated symptoms include rhinorrhea and cough. Pertinent negatives include no fever, no headaches, no vomiting and no abdominal pain.    Past Medical History:  Diagnosis Date  . Anxiety   . Depression   . Giant cell arteritis ([place])   . Gouty arthropathy   . Hyperlipidemia   . Hypertension   . Migraines   . Polymyalgia [facility])     Patient Active Problem List   Diagnosis Date Noted  . Autoimmune hemolytic anemia ([place]) 11/20/2016  . Hypersensitivity reaction 11/20/2016  . Anemia 03/19/2016  . Breast mass, right 07/01/2012    Past Surgical History:  Procedure Laterality Date  . BARTHOLIN GLAND CYST EXCISION     non ca  . EYE MUSCLE SURGERY     as a child 50 yrs old  . KNEE SURGERY  2004   rt knee  . toncil    . TONSILLECTOMY     as a child    OB History    No data available         Home Medications    Prior to Admission medications   Medication Sig Start Date End Date Taking? Authorizing Provider  acetaminophen (TYLENOL) 650 MG CR tablet Take 1,300 mg by mouth 2 (two) times daily. Two in AM and two in PM.    Yes Historical Provider, MD  cholecalciferol (VITAMIN D) 1000 UNITS tablet Take 1,000 Units by mouth every evening.    Yes Historical Provider, MD  clonazePAM (KLONOPIN) 0.5 MG tablet Take 0.25-0.5 mg by mouth 2 (two) times daily as needed for anxiety.   Yes Cari Caraway, MD  folic acid (FOLVITE) 1 MG tablet TAKE 1 TABLET BY MOUTH DAILY Patient taking differently: TAKE 1 MG BY MOUTH DAILY 05/18/16  Yes Ladell Pier, MD  hydrochlorothiazide (HYDRODIURIL) 25 MG tablet Take 12.5 mg by mouth every morning.  02/20/16  Yes Historical Provider, MD  HYDROMET 5-1.5 MG/5ML syrup Take 5 mLs by mouth every 6 (six) hours as needed for cough.  12/15/16  Yes Historical Provider, MD  loperamide (IMODIUM) 2 MG capsule Take 2 mg by mouth as needed for diarrhea or loose stools.    Yes Historical Provider, MD  metoprolol (LOPRESSOR) 50 MG tablet Take 50 mg by mouth 2 (two) times daily. Takes 50 mg in AM and  25 mg in PM   Yes Historical Provider, MD  Multiple Vitamin (MULTIVITAMIN) tablet Take 1 tablet by mouth every evening.    Yes Historical Provider, MD  omeprazole (PRILOSEC OTC) 20 MG tablet Take 20 mg by mouth every morning.    Yes Historical Provider, MD  predniSONE (DELTASONE) 10 MG tablet TAKE 2 TABLETS BY MOUTH ONE DAILY WITH BREAKFAST Patient taking differently: TAKE 20 MG BY MOUTH ONE DAILY WITH BREAKFAST 11/29/16  Yes Ladell Pier, MD  simethicone (GAS-X) 80 MG chewable tablet Chew 80 mg by mouth every 6 (six) hours as needed for flatulence.    Yes Historical Provider, MD  VENTOLIN HFA 108 (90 Base) MCG/ACT inhaler Inhale 2 puffs into the lungs every 6 (six) hours as needed for wheezing or shortness of breath.  12/15/16  Yes Historical Provider, MD    Family  History Family History  Problem Relation Age of Onset  . Adopted: Yes  . Arthritis Mother   . Stroke Mother     Social History Social History  Substance Use Topics  . Smoking status: Never Smoker  . Smokeless tobacco: Never Used  . Alcohol use Not on file     Allergies   Penicillins; Ceftin [cefuroxime axetil]; Ciprofloxacin; Darvocet [propoxyphene n-acetaminophen]; Levaquin [levofloxacin in d5w]; Sulfa antibiotics; and Zyrtec [cetirizine hcl]   Review of Systems Review of Systems  Constitutional: Negative for activity change, appetite change and fever.  HENT: Positive for congestion and rhinorrhea.   Respiratory: Positive for cough and shortness of breath.   Gastrointestinal: Negative for abdominal pain, nausea and vomiting.  Genitourinary: Negative for dysuria, hematuria, vaginal bleeding and vaginal discharge.  Musculoskeletal: Negative for arthralgias, back pain and myalgias.  Neurological: Negative for dizziness, weakness and headaches.   all other systems are negative except as noted in the HPI and PMH.     Physical Exam Updated Vital Signs BP (!) 150/83   Pulse 96   Temp 98.4 F (36.9 C) (Oral)   Resp 18   Ht 5\' 4"  (1.626 m)   Wt 201 lb (91.2 kg)   SpO2 96%   BMI 34.50 kg/m   Physical Exam  Constitutional: She is oriented to person, place, and time. She appears well-developed and well-nourished. No distress.  Speaking in full sentences, no distress  HENT:  Head: Normocephalic and atraumatic.  Mouth/Throat: Oropharynx is clear and moist. No oropharyngeal exudate.  Eyes: Conjunctivae and EOM are normal. Pupils are equal, round, and reactive to light.  Neck: Normal range of motion. Neck supple.  No meningismus.  Cardiovascular: Normal rate, regular rhythm, normal heart sounds and intact distal pulses.   No murmur heard. Pulmonary/Chest: Effort normal and breath sounds normal. No respiratory distress. She has no wheezes.  Good air exchange, no wheezing   Abdominal: Soft. There is no tenderness. There is no rebound and no guarding.  Musculoskeletal: Normal range of motion. She exhibits no edema or tenderness.  Neurological: She is alert and oriented to person, place, and time. No cranial nerve deficit. She exhibits normal muscle tone. Coordination normal.   5/5 strength throughout. CN 2-12 intact.Equal grip strength.   Skin: Skin is warm. Capillary refill takes less than 2 seconds.  Psychiatric: She has a normal mood and affect. Her behavior is normal.  Nursing note and vitals reviewed.    ED Treatments / Results  Labs (all labs ordered are listed, but only abnormal results are displayed) Labs Reviewed  CBC WITH DIFFERENTIAL/PLATELET - Abnormal; Notable for the following:  Result Value   WBC 13.3 (*)    Neutro Abs 10.9 (*)    All other components within normal limits  BASIC METABOLIC PANEL - Abnormal; Notable for the following:    Potassium 3.0 (*)    Chloride 100 (*)    BUN 26 (*)    All other components within normal limits  D-DIMER, QUANTITATIVE (NOT AT [facility]) - Abnormal; Notable for the following:    D-Dimer, Quant 0.52 (*)    All other components within normal limits  BRAIN NATRIURETIC PEPTIDE    EKG  EKG Interpretation None       Radiology Dg Chest 2 View  Result Date: 12/17/2016 CLINICAL DATA:  Cough congestion and dyspnea for 6 days, worsened this morning. EXAM: CHEST  2 VIEW COMPARISON:  02/15/2016. FINDINGS: The lungs are clear except for minimal linear scarring in the right lateral base. There is borderline cardiomegaly, unchanged. Hilar and mediastinal contours are unremarkable and unchanged. Pulmonary vasculature is normal. No pleural effusions. IMPRESSION: Stable borderline cardiomegaly. No consolidation or effusion. Normal vasculature. Electronically Signed   By: Andreas Newport M.D.   On: 12/17/2016 05:36    Procedures Procedures (including critical care time)  Medications Ordered in  ED Medications  ipratropium-albuterol (DUONEB) 0.5-2.5 (3) MG/3ML nebulizer solution 3 mL (3 mLs Nebulization Given 12/17/16 0609)     Initial Impression / Assessment and Plan / ED Course  I have reviewed the triage vital signs and the nursing notes.  Pertinent labs & imaging results that were available during my care of the patient were reviewed by me and considered in my medical decision making (see chart for details).     Patient with 5-6 days of coughing and wheezing.  No chest pain or fever.   Patient not wheezing on exam. Chest x-rays negative for infiltrate. She is not hypoxic and able to speak in full sentences. Labs obtained show stable hemoglobin. Age adjusted D-dimer is negative. Doubt ACS, doubt PE. CXR negative for pneumonia.  Patient ambulatory without desaturation. We'll treat for bronchitis with antibiotics. Continue her chronic steroids. Continue bronchodilators and cough suppressants. Follow-up with PCP. Return precautions discussed.   Final Clinical Impressions(s) / ED Diagnoses   Final diagnoses:  Bronchitis    New Prescriptions New Prescriptions   No medications on file     Ezequiel Essex, MD 12/17/16 [phone]

## 2016-12-17 NOTE — Progress Notes (Signed)
Patient here at [facility] lobby stating that she was just discharged from the ED with a diagnosis of bronchitis and is not up for visits at [facility] today.  Dr. Benay Spice notified and message sent to scheduling to reschedule patient.

## 2016-12-17 NOTE — ED Triage Notes (Signed)
Able to speak in full sentences.

## 2016-12-17 NOTE — ED Notes (Signed)
DELAY WITH DISCHARGE. PT REQUESTING MEDICATION BEFORE DISCHARGE

## 2016-12-18 ENCOUNTER — Telehealth: Payer: Self-pay | Admitting: Oncology

## 2016-12-18 NOTE — Telephone Encounter (Signed)
lvm to inform pt of r/s appt to 5/11 at 0900 per LOS

## 2016-12-20 DIAGNOSIS — J209 Acute bronchitis, unspecified: Secondary | ICD-10-CM | POA: Diagnosis not present

## 2016-12-20 DIAGNOSIS — J45909 Unspecified asthma, uncomplicated: Secondary | ICD-10-CM | POA: Diagnosis not present

## 2016-12-20 DIAGNOSIS — E876 Hypokalemia: Secondary | ICD-10-CM | POA: Diagnosis not present

## 2016-12-20 DIAGNOSIS — M85851 Other specified disorders of bone density and structure, right thigh: Secondary | ICD-10-CM | POA: Diagnosis not present

## 2016-12-20 DIAGNOSIS — D591 Other autoimmune hemolytic anemias: Secondary | ICD-10-CM | POA: Diagnosis not present

## 2016-12-20 DIAGNOSIS — I10 Essential (primary) hypertension: Secondary | ICD-10-CM | POA: Diagnosis not present

## 2016-12-25 ENCOUNTER — Other Ambulatory Visit: Payer: Self-pay | Admitting: Oncology

## 2016-12-25 ENCOUNTER — Other Ambulatory Visit: Payer: Self-pay | Admitting: *Deleted

## 2016-12-27 ENCOUNTER — Telehealth: Payer: Self-pay | Admitting: *Deleted

## 2016-12-27 ENCOUNTER — Telehealth: Payer: Self-pay

## 2016-12-27 NOTE — Telephone Encounter (Signed)
Call received from patient requesting a refill of her Prednisone.  Refill sent as requested.  Patient appreciative of assistance.

## 2016-12-27 NOTE — Telephone Encounter (Signed)
Pt is requesting a prednisone refill. She takes 20 mg daily. Dr Gearldine Shown note states he will start tapering prednisone if 4/30 hgb is stable. Forwarded to Dr Benay Spice for instructions on refill.

## 2016-12-28 ENCOUNTER — Ambulatory Visit (HOSPITAL_BASED_OUTPATIENT_CLINIC_OR_DEPARTMENT_OTHER): Payer: Medicare Other | Admitting: Oncology

## 2016-12-28 ENCOUNTER — Telehealth: Payer: Self-pay | Admitting: Oncology

## 2016-12-28 ENCOUNTER — Ambulatory Visit (HOSPITAL_BASED_OUTPATIENT_CLINIC_OR_DEPARTMENT_OTHER): Payer: Medicare Other

## 2016-12-28 VITALS — BP 146/85 | HR 99 | Temp 98.8°F | Resp 18 | Ht 64.0 in | Wt 201.8 lb

## 2016-12-28 VITALS — BP 138/74 | HR 90 | Temp 98.1°F | Resp 18

## 2016-12-28 DIAGNOSIS — D591 Autoimmune hemolytic anemia, unspecified: Secondary | ICD-10-CM

## 2016-12-28 DIAGNOSIS — D599 Acquired hemolytic anemia, unspecified: Secondary | ICD-10-CM

## 2016-12-28 DIAGNOSIS — Z5112 Encounter for antineoplastic immunotherapy: Secondary | ICD-10-CM | POA: Diagnosis not present

## 2016-12-28 LAB — CBC & DIFF AND RETIC
BASO%: 0.3 % (ref 0.0–2.0)
BASOS ABS: 0 10*3/uL (ref 0.0–0.1)
EOS%: 0.6 % (ref 0.0–7.0)
Eosinophils Absolute: 0.1 10*3/uL (ref 0.0–0.5)
HEMATOCRIT: 38.9 % (ref 34.8–46.6)
HEMOGLOBIN: 12.8 g/dL (ref 11.6–15.9)
Immature Retic Fract: 5.8 % (ref 1.60–10.00)
LYMPH#: 0.9 10*3/uL (ref 0.9–3.3)
LYMPH%: 5.9 % — ABNORMAL LOW (ref 14.0–49.7)
MCH: 31.1 pg (ref 25.1–34.0)
MCHC: 32.9 g/dL (ref 31.5–36.0)
MCV: 94.6 fL (ref 79.5–101.0)
MONO#: 0.6 10*3/uL (ref 0.1–0.9)
MONO%: 3.6 % (ref 0.0–14.0)
NEUT#: 14.3 10*3/uL — ABNORMAL HIGH (ref 1.5–6.5)
NEUT%: 89.6 % — ABNORMAL HIGH (ref 38.4–76.8)
Platelets: 285 10*3/uL (ref 145–400)
RBC: 4.11 10*6/uL (ref 3.70–5.45)
RDW: 14.9 % — AB (ref 11.2–14.5)
RETIC %: 4.36 % — AB (ref 0.70–2.10)
RETIC CT ABS: 179.2 10*3/uL — AB (ref 33.70–90.70)
WBC: 15.9 10*3/uL — ABNORMAL HIGH (ref 3.9–10.3)

## 2016-12-28 LAB — COMPREHENSIVE METABOLIC PANEL
ALK PHOS: 70 U/L (ref 40–150)
ALT: 14 U/L (ref 0–55)
AST: 17 U/L (ref 5–34)
Albumin: 3.8 g/dL (ref 3.5–5.0)
Anion Gap: 10 mEq/L (ref 3–11)
BILIRUBIN TOTAL: 0.71 mg/dL (ref 0.20–1.20)
BUN: 22.5 mg/dL (ref 7.0–26.0)
CO2: 29 meq/L (ref 22–29)
CREATININE: 0.9 mg/dL (ref 0.6–1.1)
Calcium: 9.4 mg/dL (ref 8.4–10.4)
Chloride: 103 mEq/L (ref 98–109)
EGFR: 67 mL/min/{1.73_m2} — ABNORMAL LOW (ref >90)
GLUCOSE: 132 mg/dL (ref 70–140)
Potassium: 3.5 mEq/L (ref 3.5–5.1)
SODIUM: 142 meq/L (ref 136–145)
TOTAL PROTEIN: 6 g/dL — AB (ref 6.4–8.3)

## 2016-12-28 LAB — LACTATE DEHYDROGENASE: LDH: 231 U/L (ref 125–245)

## 2016-12-28 MED ORDER — SODIUM CHLORIDE 0.9 % IV SOLN
Freq: Once | INTRAVENOUS | Status: AC
  Administered 2016-12-28: 11:00:00 via INTRAVENOUS

## 2016-12-28 MED ORDER — DIPHENHYDRAMINE HCL 25 MG PO CAPS
50.0000 mg | ORAL_CAPSULE | Freq: Once | ORAL | Status: AC
  Administered 2016-12-28: 50 mg via ORAL

## 2016-12-28 MED ORDER — DIPHENHYDRAMINE HCL 25 MG PO CAPS
ORAL_CAPSULE | ORAL | Status: AC
  Filled 2016-12-28: qty 2

## 2016-12-28 MED ORDER — ACETAMINOPHEN 325 MG PO TABS
650.0000 mg | ORAL_TABLET | Freq: Once | ORAL | Status: AC
  Administered 2016-12-28: 650 mg via ORAL

## 2016-12-28 MED ORDER — SODIUM CHLORIDE 0.9 % IV SOLN
375.0000 mg/m2 | Freq: Once | INTRAVENOUS | Status: AC
  Administered 2016-12-28: 800 mg via INTRAVENOUS
  Filled 2016-12-28: qty 50

## 2016-12-28 MED ORDER — PREDNISONE 10 MG PO TABS: 15.0000 mg | ORAL_TABLET | Freq: Every day | ORAL | 0 refills | Status: DC

## 2016-12-28 MED ORDER — ACETAMINOPHEN 325 MG PO TABS
ORAL_TABLET | ORAL | Status: AC
  Filled 2016-12-28: qty 2

## 2016-12-28 MED ORDER — METHYLPREDNISOLONE SODIUM SUCC 125 MG IJ SOLR
INTRAMUSCULAR | Status: AC
  Filled 2016-12-28: qty 2

## 2016-12-28 MED ORDER — METHYLPREDNISOLONE SODIUM SUCC 125 MG IJ SOLR
125.0000 mg | Freq: Once | INTRAMUSCULAR | Status: AC
  Administered 2016-12-28: 125 mg via INTRAVENOUS

## 2016-12-28 NOTE — Patient Instructions (Signed)
[facility] Discharge Instructions for Patients Receiving Chemotherapy  Today you received the following chemotherapy agents: Rituxan   To help prevent nausea and vomiting after your treatment, we encourage you to take your nausea medication as directed.    If you develop nausea and vomiting that is not controlled by your nausea medication, call the clinic.   BELOW ARE SYMPTOMS THAT SHOULD BE REPORTED IMMEDIATELY:  *FEVER GREATER THAN 100.5 F  *CHILLS WITH OR WITHOUT FEVER  NAUSEA AND VOMITING THAT IS NOT CONTROLLED WITH YOUR NAUSEA MEDICATION  *UNUSUAL SHORTNESS OF BREATH  *UNUSUAL BRUISING OR BLEEDING  TENDERNESS IN MOUTH AND THROAT WITH OR WITHOUT PRESENCE OF ULCERS  *URINARY PROBLEMS  *BOWEL PROBLEMS  UNUSUAL RASH Items with * indicate a potential emergency and should be followed up as soon as possible.  Feel free to call the clinic you have any questions or concerns. The clinic phone number is [phone].  Please show the CHEMO ALERT CARD at check-in to the Emergency Department and triage nurse.   

## 2016-12-28 NOTE — Progress Notes (Signed)
  [facility] OFFICE PROGRESS NOTE   Diagnosis: Autoimmune hemolytic anemia  INTERVAL HISTORY:   Michelle Wilcox returns as scheduled. She continues prednisone at a dose of 20 mg daily. She was seen in the emergency room with a cough, back pain, and rhinorrhea on 12/17/2016. She reports the symptoms have improved. She was prescribed an inhaler and cough medication. A chest x-ray 12/17/2016 revealed no consolidation. She completed cycle 3 rituximab on 12/03/2016. She reports no symptoms of an allergic reaction during the infusion. Objective:  Vital signs in last 24 hours:  Blood pressure (!) 146/85, pulse 99, temperature 98.8 F (37.1 C), temperature source Oral, resp. rate 18, height 5\' 4"  (1.626 m), weight 201 lb 12.8 oz (91.5 kg), SpO2 98 %.    HEENT: Mild whitecoat over the tongue, no buccal thrush Resp: Coarse rhonchi at the left posterior base, mild end expiratory wheeze at the left posterior chest, good air movement bilaterally, no respiratory distress Cardio: Regular rate and rhythm GI: No hepatosplenomegaly, nontender Vascular: No leg edema   Lab Results:  Lab Results  Component Value Date   WBC 13.3 (H) 12/17/2016   HGB 12.0 12/17/2016   HCT 36.8 12/17/2016   MCV 94.1 12/17/2016   PLT 258 12/17/2016   NEUTROABS 10.9 (H) 12/17/2016    CMP     Component Value Date/Time   NA 142 12/28/2016 0911   K 3.5 12/28/2016 0911   CL 100 (L) 12/17/2016 0614   CO2 29 12/28/2016 0911   GLUCOSE 132 12/28/2016 0911   BUN 22.5 12/28/2016 0911   CREATININE 0.9 12/28/2016 0911   CALCIUM 9.4 12/28/2016 0911   PROT 6.0 (L) 12/28/2016 0911   ALBUMIN 3.8 12/28/2016 0911   AST 17 12/28/2016 0911   ALT 14 12/28/2016 0911   ALKPHOS 70 12/28/2016 0911   BILITOT 0.71 12/28/2016 0911   GFRNONAA >60 12/17/2016 0614   GFRAA >60 12/17/2016 2376    Medications: I have reviewed the patient's current medications.  Assessment/Plan: 1. Autoimmune hemolytic anemia, warm  autoantibody confirmed on blood typing, DAT IgG positive  Prednisone started 03/20/2016  Tapered to 15 mg daily on 04/30/2016  Hemoglobin lower, elevated LDH 05/21/2016-prednisone increased to 30 mgdaily  Prednisone taper to 25 mg daily 06/18/2016  Prednisone taper to 20 mg daily 07/16/2016  Prednisone taper to 15 mg daily 08/27/2016  Prednisone taper to 12.5 mg daily 09/25/2016  Hemoglobin lower, prednisone increased to 20 mgdaily on 10/11/2016  Cycle 1 rituximab 11/19/2016  Cycle 2 rituximab 11/26/2016  Cycle 3 rituximab 12/03/2016  Cycle 4 rituximab 12/28/2016  2. History ofExertional dyspnea secondary to #1  3. Mild splenomegaly on CT 03/16/2016  4. History of polymyalgia rheumatica  5. History of giant cell/temporal arteritis-maintained on methotrexate in the remote past, now on low dose prednisone    Disposition:  She appears stable. She will complete a final treatment with rituximab today. We will follow-up on the hemoglobin today. If the hemoglobin is stable or improved the plan is to taper the prednisone to 15 mg daily. Michelle Wilcox will return for a CBC in 2 weeks and an office visit in 4 weeks.  25 minutes were spent with the patient today. The majority of the time was used for counseling and correlation of care.  Betsy Coder, MD  12/28/2016  10:49 AM

## 2016-12-28 NOTE — Telephone Encounter (Signed)
Gave patient AVS and calender per 5/11 los.

## 2016-12-28 NOTE — Addendum Note (Signed)
Addended by: Rosalio Macadamia C on: 12/28/2016 01:20 PM   Modules accepted: Orders

## 2016-12-31 ENCOUNTER — Encounter (HOSPITAL_COMMUNITY): Payer: Self-pay | Admitting: Emergency Medicine

## 2016-12-31 DIAGNOSIS — M353 Polymyalgia rheumatica: Secondary | ICD-10-CM | POA: Diagnosis not present

## 2016-12-31 DIAGNOSIS — F419 Anxiety disorder, unspecified: Secondary | ICD-10-CM | POA: Diagnosis not present

## 2016-12-31 DIAGNOSIS — D591 Other autoimmune hemolytic anemias: Secondary | ICD-10-CM | POA: Diagnosis not present

## 2016-12-31 DIAGNOSIS — Z1211 Encounter for screening for malignant neoplasm of colon: Secondary | ICD-10-CM | POA: Diagnosis not present

## 2016-12-31 DIAGNOSIS — Z7189 Other specified counseling: Secondary | ICD-10-CM | POA: Diagnosis not present

## 2016-12-31 DIAGNOSIS — T783XXA Angioneurotic edema, initial encounter: Secondary | ICD-10-CM | POA: Insufficient documentation

## 2016-12-31 DIAGNOSIS — Z0000 Encounter for general adult medical examination without abnormal findings: Secondary | ICD-10-CM | POA: Diagnosis not present

## 2016-12-31 DIAGNOSIS — J45909 Unspecified asthma, uncomplicated: Secondary | ICD-10-CM | POA: Diagnosis not present

## 2016-12-31 DIAGNOSIS — M899 Disorder of bone, unspecified: Secondary | ICD-10-CM | POA: Diagnosis not present

## 2016-12-31 DIAGNOSIS — I10 Essential (primary) hypertension: Secondary | ICD-10-CM | POA: Insufficient documentation

## 2016-12-31 DIAGNOSIS — Z1389 Encounter for screening for other disorder: Secondary | ICD-10-CM | POA: Diagnosis not present

## 2016-12-31 DIAGNOSIS — Z01419 Encounter for gynecological examination (general) (routine) without abnormal findings: Secondary | ICD-10-CM | POA: Diagnosis not present

## 2016-12-31 DIAGNOSIS — M85852 Other specified disorders of bone density and structure, left thigh: Secondary | ICD-10-CM | POA: Diagnosis not present

## 2016-12-31 DIAGNOSIS — R220 Localized swelling, mass and lump, head: Secondary | ICD-10-CM | POA: Diagnosis present

## 2016-12-31 NOTE — ED Triage Notes (Signed)
Pt from home with complaints of oral swelling. Pt denies difficulty breathing. Patent airway. Pt states she has hemolytic anemia and gets infusions regularly. Pt states she has an allergy to the medication so she already has to be pre-treated with benadryl before each infusion. Pt's last infusion was Friday. Tonight around 2230 pt began experiencing the oral swelling. Pt has sores on the outer aspect of her tongue that she states is where she has swelling. Pt states that is the only place on her tongue where she feels swelling. Pt also reports feeling "a little puffiness in her bottom lip" Pt has clear lung sounds

## 2017-01-01 ENCOUNTER — Telehealth: Payer: Self-pay | Admitting: *Deleted

## 2017-01-01 ENCOUNTER — Emergency Department (HOSPITAL_COMMUNITY)
Admission: EM | Admit: 2017-01-01 | Discharge: 2017-01-01 | Disposition: A | Discharge status: Home / Self Care | Payer: Medicare Other | Attending: Emergency Medicine | Admitting: Emergency Medicine

## 2017-01-01 DIAGNOSIS — T783XXA Angioneurotic edema, initial encounter: Secondary | ICD-10-CM

## 2017-01-01 HISTORY — DX: Other giant cell arteritis: M31.6

## 2017-01-01 HISTORY — DX: Polymyalgia rheumatica: M35.3

## 2017-01-01 HISTORY — DX: Hereditary hemolytic anemia, unspecified: D58.9

## 2017-01-01 NOTE — Telephone Encounter (Signed)
Message received from patient stating that she was in the ED last night with swelling and ridges on the edge of her tongue and that her bottom lip was puffy and wanted Dr. Benay Spice to be aware.  Patient stated that she was headed to work and would call [place] if symptoms worsened.  Call placed back to patient at work and patient states that her bottom lip does remain slightly puffy and that she has slight "ridges on the edge" of her tongue.  She denies any SOB or difficulty swallowing. Patient instructed to return to the ED if any SOB or difficulty swallowing.  She states that she does have Benadryl at home and will take some when she gets home from work today.  Patient states that she will call [facility] back if symptoms worsen.  Patient appreciative of call.

## 2017-01-01 NOTE — ED Provider Notes (Signed)
[facility] DEPT Provider Note   CSN: [id number] Arrival date & time: 12/31/16  2353  By signing my name below, I, Michelle Wilcox, attest that this documentation has been prepared under the direction and in the presence of Michelle Etienne, DO. Electronically Signed: Theresia Wilcox, ED Scribe. 01/01/17. 2:23 AM.  History   Chief Complaint Chief Complaint  Patient presents with  . Oral Swelling   The history is provided by the patient. No language interpreter was used.   HPI Comments: Michelle Wilcox is a 71 y.o. female with hemolytic anemia and HTN, who presents to the Emergency Department complaining of moderate, gradually worsening swelling to her tongue onset 3 hours ago. No trouble swallowing or breathing. No treatments tried prior to arrival in the ED. She reports she receives rituxan infusions regularly and her last one was 4 days ago. Per pt, she takes Metoprolol and HCTZ. She denies taking an ACE inhibitor. No other complaints or symptoms at this time.   Past Medical History:  Diagnosis Date  . Anxiety   . Depression   . Giant cell arteritis ([place])   . Gouty arthropathy   . Hemolytic anemia ([place])   . Hyperlipidemia   . Hypertension   . Migraines   . Polymyalgia ([place])   . Polymyalgia rheumatica ([place])   . Temporal arteritis [facility])     Patient Active Problem List   Diagnosis Date Noted  . Autoimmune hemolytic anemia ([place]) 11/20/2016  . Hypersensitivity reaction 11/20/2016  . Anemia 03/19/2016  . Breast mass, right 07/01/2012    Past Surgical History:  Procedure Laterality Date  . BARTHOLIN GLAND CYST EXCISION     non ca  . EYE MUSCLE SURGERY     as a child 71 yrs old  . KNEE SURGERY  2004   rt knee  . toncil    . TONSILLECTOMY     as a child    OB History    No data available       Home Medications    Prior to Admission medications   Medication Sig Start Date End Date Taking? Authorizing Provider  acetaminophen (TYLENOL) 650 MG CR tablet Take  1,300 mg by mouth 2 (two) times daily. Two in AM and two in PM.     [provider]  cholecalciferol (VITAMIN D) 1000 UNITS tablet Take 1,000 Units by mouth every evening.     [provider]  clonazePAM (KLONOPIN) 0.5 MG tablet Take 0.25-0.5 mg by mouth 2 (two) times daily as needed for anxiety.    Cari Caraway, MD  folic acid (FOLVITE) 1 MG tablet TAKE 1 TABLET BY MOUTH DAILY Patient taking differently: TAKE 1 MG BY MOUTH DAILY 05/18/16   Ladell Pier, MD  hydrochlorothiazide (HYDRODIURIL) 25 MG tablet Take 12.5 mg by mouth every morning.  02/20/16   [provider]  HYDROMET 5-1.5 MG/5ML syrup Take 5 mLs by mouth every 6 (six) hours as needed for cough.  12/15/16   [provider]  loperamide (IMODIUM) 2 MG capsule Take 2 mg by mouth as needed for diarrhea or loose stools.     [provider]  metoprolol (LOPRESSOR) 50 MG tablet Take 50 mg by mouth 2 (two) times daily. Takes 50 mg in AM and 25 mg in PM    [provider]  Multiple Vitamin (MULTIVITAMIN) tablet Take 1 tablet by mouth every evening.     [provider]  omeprazole (PRILOSEC OTC) 20 MG tablet Take 20 mg by  mouth every morning.     [provider]  predniSONE (DELTASONE) 10 MG tablet Take 1.5 tablets (15 mg total) by mouth daily with breakfast. 12/28/16   Ladell Pier, MD  simethicone (GAS-X) 80 MG chewable tablet Chew 80 mg by mouth every 6 (six) hours as needed for flatulence.     [provider]  VENTOLIN HFA 108 (90 Base) MCG/ACT inhaler Inhale 2 puffs into the lungs every 6 (six) hours as needed for wheezing or shortness of breath.  12/15/16   [provider]    Family History Family History  Problem Relation Age of Onset  . Adopted: Yes  . Arthritis Mother   . Stroke Mother     Social History Social History  Substance Use Topics  . Smoking status: Never Smoker  . Smokeless tobacco: Never Used  . Alcohol use Yes      Comment: rarely      Allergies   Penicillins; Ceftin [cefuroxime axetil]; Ciprofloxacin; Darvocet [propoxyphene n-acetaminophen]; Levaquin [levofloxacin in d5w]; Sulfa antibiotics; and Zyrtec [cetirizine hcl]   Review of Systems Review of Systems  Constitutional: Negative for chills and fever.  HENT: Positive for facial swelling (tongue). Negative for congestion, rhinorrhea and trouble swallowing.   Eyes: Negative for redness and visual disturbance.  Respiratory: Negative for choking, shortness of breath and wheezing.   Cardiovascular: Negative for chest pain and palpitations.  Gastrointestinal: Negative for nausea and vomiting.  Genitourinary: Negative for dysuria and urgency.  Musculoskeletal: Negative for arthralgias and myalgias.  Skin: Negative for pallor and wound.  Neurological: Negative for dizziness and headaches.     Physical Exam Updated Vital Signs BP 134/76 (BP Location: Right Arm)   Pulse 85   Resp 16   SpO2 97%   Physical Exam  Constitutional: She is oriented to person, place, and time. She appears well-developed and well-nourished. No distress.  HENT:  Head: Normocephalic and atraumatic.  Dentions along her tongue matching her teeth. Tolerated secretions. No swelling.   Eyes: EOM are normal. Pupils are equal, round, and reactive to light.  Neck: Normal range of motion. Neck supple.  Cardiovascular: Normal rate.   Pulmonary/Chest: Effort normal and breath sounds normal. She has no wheezes. She has no rales.  Lungs are clear.  Musculoskeletal: She exhibits no edema or tenderness.  Neurological: She is alert and oriented to person, place, and time.  Skin: Skin is warm and dry. She is not diaphoretic.  Psychiatric: She has a normal mood and affect. Her behavior is normal.  Nursing note and vitals reviewed.    ED Treatments / Results  DIAGNOSTIC STUDIES: Oxygen Saturation is 100% on RA, normal by my interpretation.   COORDINATION OF CARE: 1:59  AM-Discussed next steps with pt including following up with primary care provider if symptoms do not subside. Pt verbalized understanding and is agreeable with the plan.   Labs (all labs ordered are listed, but only abnormal results are displayed) Labs Reviewed - No data to display  EKG  EKG Interpretation None       Radiology No results found.  Procedures Procedures (including critical care time)  Medications Ordered in ED Medications - No data to display   Initial Impression / Assessment and Plan / ED Course  I have reviewed the triage vital signs and the nursing notes.  Pertinent labs & imaging results that were available during my care of the patient were reviewed by me and considered in my medical decision making (see chart for details).  71 yo F With a chief complaint tongue swelling. No noted swelling on exam. Patient does have some indention that fit her teeth. Tolerating secretions, not on ACEI.   Unsure of etiology, not severe.  PCP follow up.   7:00 AM:  I have discussed the diagnosis/risks/treatment options with the patient and family and believe the pt to be eligible for discharge home to follow-up with PCP. We also discussed returning to the ED immediately if new or worsening sx occur. We discussed the sx which are most concerning (e.g., sudden worsening pain, fever, inability to tolerate by mouth) that necessitate immediate return. Medications administered to the patient during their visit and any new prescriptions provided to the patient are listed below.  Medications given during this visit Medications - No data to display   The patient appears reasonably screen and/or stabilized for discharge and I doubt any other medical condition or other [facility] requiring further screening, evaluation, or treatment in the ED at this time prior to discharge.    Final Clinical Impressions(s) / ED Diagnoses   Final diagnoses:  Angioedema, initial encounter    New  Prescriptions Discharge Medication List as of 01/01/2017  2:17 AM     I personally performed the services described in this documentation, which was scribed in my presence. The recorded information has been reviewed and is accurate.      Michelle Etienne, DO 01/01/17 0700

## 2017-01-01 NOTE — Discharge Instructions (Signed)
Return for any worsening symptoms.

## 2017-01-11 ENCOUNTER — Other Ambulatory Visit (HOSPITAL_BASED_OUTPATIENT_CLINIC_OR_DEPARTMENT_OTHER): Payer: Medicare Other

## 2017-01-11 ENCOUNTER — Telehealth: Payer: Self-pay | Admitting: *Deleted

## 2017-01-11 DIAGNOSIS — D591 Autoimmune hemolytic anemia, unspecified: Secondary | ICD-10-CM

## 2017-01-11 LAB — CBC WITH DIFFERENTIAL/PLATELET
BASO%: 1.5 % (ref 0.0–2.0)
BASOS ABS: 0.2 10*3/uL — AB (ref 0.0–0.1)
EOS ABS: 0.3 10*3/uL (ref 0.0–0.5)
EOS%: 2.7 % (ref 0.0–7.0)
HEMATOCRIT: 39.9 % (ref 34.8–46.6)
HEMOGLOBIN: 13.5 g/dL (ref 11.6–15.9)
LYMPH%: 22 % (ref 14.0–49.7)
MCH: 30.9 pg (ref 25.1–34.0)
MCHC: 33.9 g/dL (ref 31.5–36.0)
MCV: 91.4 fL (ref 79.5–101.0)
MONO#: 0.7 10*3/uL (ref 0.1–0.9)
MONO%: 6.2 % (ref 0.0–14.0)
NEUT#: 7.5 10*3/uL — ABNORMAL HIGH (ref 1.5–6.5)
NEUT%: 67.6 % (ref 38.4–76.8)
Platelets: 297 10*3/uL (ref 145–400)
RBC: 4.36 10*6/uL (ref 3.70–5.45)
RDW: 15.3 % — AB (ref 11.2–14.5)
WBC: 11.1 10*3/uL — ABNORMAL HIGH (ref 3.9–10.3)
lymph#: 2.4 10*3/uL (ref 0.9–3.3)

## 2017-01-11 NOTE — Telephone Encounter (Signed)
Message left on patients voicemail to inform her per order of Dr. Benay Spice that hgb looks good and to decrease Prednisone to 10 mg daily.  Instructed pt to call [place] back on 01/15/17 to confirm that she received message.

## 2017-01-11 NOTE — Telephone Encounter (Signed)
-----   Message from Michelle Pier, MD sent at 01/11/2017  4:10 PM EDT ----- Please call patient, hb looks good, decrease prednisone to 10mg  daily

## 2017-01-15 ENCOUNTER — Telehealth: Payer: Self-pay | Admitting: *Deleted

## 2017-01-15 NOTE — Telephone Encounter (Signed)
Returned call to pt, OK to continue at same dose for now. Will check CBC 6/11 as scheduled.  Pt reports she periodically gets "overheated, with sweats from the waist up." Seems to happen more often since starting Rituxan. She denies fever or shaking chills. Pt reports she still has "angioedema on tongue." Dentist prescribed antibiotic. Has follow up there 01/21/17.

## 2017-01-15 NOTE — Telephone Encounter (Signed)
Message from pt reporting she is uncomfortable decreasing prednisone dose since she has only been on 15 mg for 2 weeks.

## 2017-01-15 NOTE — Telephone Encounter (Signed)
Ok to continue at 15mg  for another 2 weeks, then check cbc

## 2017-01-28 ENCOUNTER — Telehealth: Payer: Self-pay | Admitting: Nurse Practitioner

## 2017-01-28 ENCOUNTER — Other Ambulatory Visit (HOSPITAL_BASED_OUTPATIENT_CLINIC_OR_DEPARTMENT_OTHER): Payer: Medicare Other

## 2017-01-28 ENCOUNTER — Ambulatory Visit (HOSPITAL_BASED_OUTPATIENT_CLINIC_OR_DEPARTMENT_OTHER): Payer: Medicare Other | Admitting: Nurse Practitioner

## 2017-01-28 VITALS — BP 121/54 | HR 71 | Temp 98.1°F | Resp 17 | Ht 64.0 in | Wt 202.7 lb

## 2017-01-28 DIAGNOSIS — D591 Autoimmune hemolytic anemia, unspecified: Secondary | ICD-10-CM

## 2017-01-28 LAB — CBC WITH DIFFERENTIAL/PLATELET
BASO%: 0.6 % (ref 0.0–2.0)
BASOS ABS: 0.1 10*3/uL (ref 0.0–0.1)
EOS%: 2 % (ref 0.0–7.0)
Eosinophils Absolute: 0.2 10*3/uL (ref 0.0–0.5)
HEMATOCRIT: 40.4 % (ref 34.8–46.6)
HEMOGLOBIN: 13 g/dL (ref 11.6–15.9)
LYMPH#: 1.9 10*3/uL (ref 0.9–3.3)
LYMPH%: 16.5 % (ref 14.0–49.7)
MCH: 30.7 pg (ref 25.1–34.0)
MCHC: 32.2 g/dL (ref 31.5–36.0)
MCV: 95.3 fL (ref 79.5–101.0)
MONO#: 0.6 10*3/uL (ref 0.1–0.9)
MONO%: 5.3 % (ref 0.0–14.0)
NEUT#: 8.6 10*3/uL — ABNORMAL HIGH (ref 1.5–6.5)
NEUT%: 75.6 % (ref 38.4–76.8)
Platelets: 275 10*3/uL (ref 145–400)
RBC: 4.24 10*6/uL (ref 3.70–5.45)
RDW: 15.1 % — AB (ref 11.2–14.5)
WBC: 11.4 10*3/uL — ABNORMAL HIGH (ref 3.9–10.3)

## 2017-01-28 MED ORDER — PREDNISONE 5 MG PO TABS: ORAL_TABLET | ORAL | 0 refills | Status: DC

## 2017-01-28 MED ORDER — PREDNISONE 10 MG PO TABS: ORAL_TABLET | ORAL | 0 refills | Status: DC

## 2017-01-28 NOTE — Telephone Encounter (Signed)
Gave patient AVS and calender per 6/11 los.

## 2017-01-28 NOTE — Progress Notes (Addendum)
  [facility] OFFICE PROGRESS NOTE   Diagnosis:  Autoimmune hemolytic anemia  INTERVAL HISTORY:   Michelle Wilcox returns as scheduled. She completed week 4 Rituxan 12/28/2016. She continues prednisone 15 mg daily. She was seen in the emergency department 01/01/2017 with complaint of tongue swelling. No swelling was noted on exam. Overall she reports she "feels pretty good". She notes decreased "stamina" as the day progresses. She perspires easily, with minimal exertion. No fever. She has a good appetite.  Objective:  Vital signs in last 24 hours:  Blood pressure (!) 121/54, pulse 71, temperature 98.1 F (36.7 C), temperature source Oral, resp. rate 17, height 5\' 4"  (1.626 m), weight 202 lb 11.2 oz (91.9 kg), SpO2 96 %.    HEENT: No thrush or ulcers. Lymphatics: No palpable cervical or supra-clavicular lymph nodes. Resp: Rales left lung base. No respiratory distress. Cardio: Regular rate and rhythm. GI: Abdomen is soft. Mild tenderness at the right upper abdomen. No organomegaly. Vascular: No leg edema.    Lab Results:  Lab Results  Component Value Date   WBC 11.4 (H) 01/28/2017   HGB 13.0 01/28/2017   HCT 40.4 01/28/2017   MCV 95.3 01/28/2017   PLT 275 01/28/2017   NEUTROABS 8.6 (H) 01/28/2017    Imaging:  No results found.  Medications: I have reviewed the patient's current medications.  Assessment/Plan: 1. 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, DAT IgG positive  Prednisone started 03/20/2016  Tapered to 15 mg daily on 04/30/2016  Hemoglobin lower, elevated LDH 05/21/2016-prednisone increased to 30 mgdaily  Prednisone taper to 25 mg daily 06/18/2016  Prednisone taper to 20 mg daily 07/16/2016  Prednisone taper to 15 mg daily 08/27/2016  Prednisone taper to 12.5 mg daily 09/25/2016  Hemoglobin lower, prednisone increased to 20 mgdaily on 10/11/2016  Cycle 1 rituximab 11/19/2016  Cycle 2 rituximab  11/26/2016  Cycle 3 rituximab 12/03/2016  Cycle 4 rituximab 12/28/2016  Prednisone taper to 15 mg daily 12/28/2016  Prednisone taper to 12.5 mg daily 01/28/2017  2. History ofExertional dyspnea secondary to #1  3. Mild splenomegaly on CT 03/16/2016  4. History of polymyalgia rheumatica  5. History of giant cell/temporal arteritis-maintained on methotrexate in the remote past, now on low dose prednisone      Disposition: Michelle Wilcox appears stable. She has been maintained on prednisone 15 mg daily since 12/28/2016. Hemoglobin remains stable. She will taper the prednisone to 12.5 mg daily. She will return for a follow-up CBC in 3 weeks and 6 weeks. We will see her in follow-up in 9 weeks. She will contact the office in the interim with any problems.  Patient seen with Dr. Benay Spice.    Ned Card ANP/GNP-BC   01/28/2017  9:59 AM  This was a shared visit with Ned Card. The plan is to proceed with a slow prednisone taper.  [name], M.D.

## 2017-02-18 ENCOUNTER — Other Ambulatory Visit (HOSPITAL_BASED_OUTPATIENT_CLINIC_OR_DEPARTMENT_OTHER): Payer: Medicare Other

## 2017-02-18 DIAGNOSIS — D591 Autoimmune hemolytic anemia, unspecified: Secondary | ICD-10-CM

## 2017-02-18 LAB — BASIC METABOLIC PANEL
ANION GAP: 11 meq/L (ref 3–11)
BUN: 23.7 mg/dL (ref 7.0–26.0)
CALCIUM: 9.5 mg/dL (ref 8.4–10.4)
CHLORIDE: 102 meq/L (ref 98–109)
CO2: 32 mEq/L — ABNORMAL HIGH (ref 22–29)
CREATININE: 0.9 mg/dL (ref 0.6–1.1)
EGFR: 65 mL/min/{1.73_m2} — ABNORMAL LOW (ref >90)
Glucose: 124 mg/dl (ref 70–140)
Potassium: 3.6 mEq/L (ref 3.5–5.1)
SODIUM: 144 meq/L (ref 136–145)

## 2017-02-18 LAB — CBC WITH DIFFERENTIAL/PLATELET
BASO%: 0.5 % (ref 0.0–2.0)
Basophils Absolute: 0.1 10*3/uL (ref 0.0–0.1)
EOS ABS: 0.1 10*3/uL (ref 0.0–0.5)
EOS%: 1.3 % (ref 0.0–7.0)
HCT: 38.9 % (ref 34.8–46.6)
HGB: 12.6 g/dL (ref 11.6–15.9)
LYMPH%: 8.8 % — AB (ref 14.0–49.7)
MCH: 30.7 pg (ref 25.1–34.0)
MCHC: 32.4 g/dL (ref 31.5–36.0)
MCV: 94.9 fL (ref 79.5–101.0)
MONO#: 0.5 10*3/uL (ref 0.1–0.9)
MONO%: 4.7 % (ref 0.0–14.0)
NEUT%: 84.7 % — ABNORMAL HIGH (ref 38.4–76.8)
NEUTROS ABS: 9.1 10*3/uL — AB (ref 1.5–6.5)
Platelets: 264 10*3/uL (ref 145–400)
RBC: 4.1 10*6/uL (ref 3.70–5.45)
RDW: 15.2 % — ABNORMAL HIGH (ref 11.2–14.5)
WBC: 10.7 10*3/uL — ABNORMAL HIGH (ref 3.9–10.3)
lymph#: 0.9 10*3/uL (ref 0.9–3.3)

## 2017-02-19 ENCOUNTER — Telehealth: Payer: Self-pay | Admitting: *Deleted

## 2017-02-19 NOTE — Telephone Encounter (Signed)
-----   Message from Owens Shark, NP sent at 02/19/2017  9:12 AM EDT ----- Decrease Prednisone to 10mg  daily f/u as scheduled

## 2017-02-19 NOTE — Telephone Encounter (Signed)
Telephone call to patient. Advised to decrease prednisone to 10mg  daily. Pt repeated back dose reduction.

## 2017-03-11 ENCOUNTER — Other Ambulatory Visit (HOSPITAL_BASED_OUTPATIENT_CLINIC_OR_DEPARTMENT_OTHER): Payer: Medicare Other

## 2017-03-11 DIAGNOSIS — D591 Autoimmune hemolytic anemia, unspecified: Secondary | ICD-10-CM

## 2017-03-11 LAB — CBC WITH DIFFERENTIAL/PLATELET
BASO%: 0.6 % (ref 0.0–2.0)
Basophils Absolute: 0.1 10*3/uL (ref 0.0–0.1)
EOS ABS: 0.1 10*3/uL (ref 0.0–0.5)
EOS%: 0.9 % (ref 0.0–7.0)
HCT: 38.3 % (ref 34.8–46.6)
HEMOGLOBIN: 12.7 g/dL (ref 11.6–15.9)
LYMPH%: 7 % — AB (ref 14.0–49.7)
MCH: 31 pg (ref 25.1–34.0)
MCHC: 33.2 g/dL (ref 31.5–36.0)
MCV: 93.2 fL (ref 79.5–101.0)
MONO#: 0.4 10*3/uL (ref 0.1–0.9)
MONO%: 3.7 % (ref 0.0–14.0)
NEUT%: 87.8 % — ABNORMAL HIGH (ref 38.4–76.8)
NEUTROS ABS: 10.3 10*3/uL — AB (ref 1.5–6.5)
Platelets: 254 10*3/uL (ref 145–400)
RBC: 4.11 10*6/uL (ref 3.70–5.45)
RDW: 15.3 % — AB (ref 11.2–14.5)
WBC: 11.7 10*3/uL — AB (ref 3.9–10.3)
lymph#: 0.8 10*3/uL — ABNORMAL LOW (ref 0.9–3.3)

## 2017-03-12 ENCOUNTER — Telehealth: Payer: Self-pay | Admitting: *Deleted

## 2017-03-12 DIAGNOSIS — D591 Autoimmune hemolytic anemia, unspecified: Secondary | ICD-10-CM

## 2017-03-12 MED ORDER — PREDNISONE 10 MG PO TABS: ORAL_TABLET | ORAL | 1 refills | Status: DC

## 2017-03-12 NOTE — Telephone Encounter (Signed)
"  Returning a call from [place]. I'm sure it was about my lab results from yesterday.  What was my hemoglobin?  Can anxiety or stress affect results?  I will need a refill on the prednisone 10 mg."  Hgb = 12.7 yesterday.  Continue prednisone 10 mg daily.  F/U as scheduled 04-01-2017 with lab 0900, Dr. Benay Spice at [facility].  Anxiety will not affect results of blood counts.     Refill sent eRx.

## 2017-03-12 NOTE — Telephone Encounter (Signed)
-----   Message from Owens Shark, NP sent at 03/12/2017  9:09 AM EDT ----- Please instruct her to continue prednisone 10mg  daily, f/u as scheduled

## 2017-03-12 NOTE — Telephone Encounter (Signed)
Left message for a return call to advise the information as directed below- continue prednisone 10mg  daily.

## 2017-03-13 ENCOUNTER — Telehealth: Payer: Self-pay | Admitting: *Deleted

## 2017-03-13 NOTE — Telephone Encounter (Signed)
Message from pt reporting [employer] will be sending FMLA forms via fax. They need to be returned within 15 days. Did not receive forms today.

## 2017-03-14 NOTE — Telephone Encounter (Signed)
Pt called back. Dr Carin Hock pod fax given to the pt. She states this is updating a prior FMLA and the patient sections does not need to be filled out. We can fax the papers back to [facility] with her section filled in.

## 2017-04-01 ENCOUNTER — Other Ambulatory Visit: Payer: Self-pay | Admitting: *Deleted

## 2017-04-01 ENCOUNTER — Telehealth: Payer: Self-pay

## 2017-04-01 ENCOUNTER — Ambulatory Visit (HOSPITAL_BASED_OUTPATIENT_CLINIC_OR_DEPARTMENT_OTHER): Payer: Medicare Other | Admitting: Oncology

## 2017-04-01 ENCOUNTER — Other Ambulatory Visit (HOSPITAL_BASED_OUTPATIENT_CLINIC_OR_DEPARTMENT_OTHER): Payer: Medicare Other

## 2017-04-01 VITALS — BP 129/57 | HR 73 | Temp 98.4°F | Resp 17 | Ht 64.0 in | Wt 199.2 lb

## 2017-04-01 DIAGNOSIS — D591 Autoimmune hemolytic anemia, unspecified: Secondary | ICD-10-CM

## 2017-04-01 LAB — CBC WITH DIFFERENTIAL/PLATELET
BASO%: 0.8 % (ref 0.0–2.0)
BASOS ABS: 0.1 10*3/uL (ref 0.0–0.1)
EOS%: 1.7 % (ref 0.0–7.0)
Eosinophils Absolute: 0.2 10*3/uL (ref 0.0–0.5)
HCT: 39 % (ref 34.8–46.6)
HGB: 12.5 g/dL (ref 11.6–15.9)
LYMPH#: 1.1 10*3/uL (ref 0.9–3.3)
LYMPH%: 12.5 % — AB (ref 14.0–49.7)
MCH: 31.3 pg (ref 25.1–34.0)
MCHC: 32.1 g/dL (ref 31.5–36.0)
MCV: 97.7 fL (ref 79.5–101.0)
MONO#: 0.6 10*3/uL (ref 0.1–0.9)
MONO%: 6.4 % (ref 0.0–14.0)
NEUT#: 6.9 10*3/uL — ABNORMAL HIGH (ref 1.5–6.5)
NEUT%: 78.6 % — AB (ref 38.4–76.8)
Platelets: 251 10*3/uL (ref 145–400)
RBC: 3.99 10*6/uL (ref 3.70–5.45)
RDW: 14.6 % — ABNORMAL HIGH (ref 11.2–14.5)
WBC: 8.8 10*3/uL (ref 3.9–10.3)

## 2017-04-01 MED ORDER — PREDNISONE 5 MG PO TABS: 5.0000 mg | ORAL_TABLET | Freq: Every day | ORAL | 1 refills | Status: DC

## 2017-04-01 MED ORDER — PREDNISONE 2.5 MG PO TABS: 2.5000 mg | ORAL_TABLET | Freq: Every day | ORAL | 1 refills | Status: DC

## 2017-04-01 NOTE — Telephone Encounter (Signed)
appts made and avs printed for patient 

## 2017-04-01 NOTE — Progress Notes (Signed)
  [facility] OFFICE PROGRESS NOTE   Diagnosis: Autoimmune hemolytic anemia  INTERVAL HISTORY:   Ms. Pedone returns for a scheduled visit. She is currently maintained on prednisone at a dose of 10 mg daily. She relates fatigue to caring for her ill husband. She has noted some fullness in the right supraclavicular fossa with associated tenderness.  Objective:  Vital signs in last 24 hours:  Blood pressure (!) 129/57, pulse 73, temperature 98.4 F (36.9 C), temperature source Oral, resp. rate 17, height 5\' 4"  (1.626 m), weight 199 lb 3.2 oz (90.4 kg), SpO2 95 %.    HEENT: Neck without mass, slight fullness in the left and right lower neck/clavicular fossa without a discrete mass, cushingoid changes of the face and neck Lymphatics: No cervical, supraclavicular, or axillary nodes Resp: Lungs clear bilaterally Cardio: Regular rate and rhythm GI: No hepatosplenomegaly, no mass Vascular: No leg edema   Lab Results:  Lab Results  Component Value Date   WBC 8.8 04/01/2017   HGB 12.5 04/01/2017   HCT 39.0 04/01/2017   MCV 97.7 04/01/2017   PLT 251 04/01/2017   NEUTROABS 6.9 (H) 04/01/2017    Medications: I have reviewed the patient's current medications.  Assessment/Plan: 1. 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5 mg  daily 01/28/2017  Prednisone taper to 10 mg daily 02/19/2017  Prednisone taper to 7.5 mg daily 04/01/2017  2. History ofExertional dyspnea secondary to #1  3. Mild splenomegaly on CT 03/16/2016  4. History of polymyalgia rheumatica  5. History of giant cell/temporal arteritis-maintained on methotrexate in the remote past, now on low dose prednisone    Disposition:  Ms. Wojtas appears stable. The hemoglobin remains adequate while on the prednisone taper. We tapered the prednisone to 7.5 mg daily. She will return for a CBC in one month and an office visit on 05/20/2017 3 she will contact us for symptoms of anemia.  The fullness in the lower neck is likely related to fat redistribution from chronic steroid use.  15 minutes were spent with the patient today. The majority of the time was used for counseling and coordination of care.  Donneta Romberg, MD  04/01/2017  10:26 AM

## 2017-04-29 ENCOUNTER — Other Ambulatory Visit (HOSPITAL_BASED_OUTPATIENT_CLINIC_OR_DEPARTMENT_OTHER): Payer: Medicare Other

## 2017-04-29 DIAGNOSIS — M2550 Pain in unspecified joint: Secondary | ICD-10-CM | POA: Diagnosis not present

## 2017-04-29 DIAGNOSIS — D591 Autoimmune hemolytic anemia, unspecified: Secondary | ICD-10-CM

## 2017-04-29 DIAGNOSIS — Z6834 Body mass index (BMI) 34.0-34.9, adult: Secondary | ICD-10-CM | POA: Diagnosis not present

## 2017-04-29 DIAGNOSIS — M150 Primary generalized (osteo)arthritis: Secondary | ICD-10-CM | POA: Diagnosis not present

## 2017-04-29 DIAGNOSIS — Z79899 Other long term (current) drug therapy: Secondary | ICD-10-CM | POA: Diagnosis not present

## 2017-04-29 DIAGNOSIS — D594 Other nonautoimmune hemolytic anemias: Secondary | ICD-10-CM | POA: Diagnosis not present

## 2017-04-29 DIAGNOSIS — E669 Obesity, unspecified: Secondary | ICD-10-CM | POA: Diagnosis not present

## 2017-04-29 DIAGNOSIS — M353 Polymyalgia rheumatica: Secondary | ICD-10-CM | POA: Diagnosis not present

## 2017-04-29 LAB — CBC WITH DIFFERENTIAL/PLATELET
BASO%: 1.5 % (ref 0.0–2.0)
Basophils Absolute: 0.1 10*3/uL (ref 0.0–0.1)
EOS ABS: 0.2 10*3/uL (ref 0.0–0.5)
EOS%: 2.5 % (ref 0.0–7.0)
HCT: 37 % (ref 34.8–46.6)
HEMOGLOBIN: 12.3 g/dL (ref 11.6–15.9)
LYMPH%: 12.3 % — ABNORMAL LOW (ref 14.0–49.7)
MCH: 31.2 pg (ref 25.1–34.0)
MCHC: 33.2 g/dL (ref 31.5–36.0)
MCV: 94.1 fL (ref 79.5–101.0)
MONO#: 0.5 10*3/uL (ref 0.1–0.9)
MONO%: 6 % (ref 0.0–14.0)
NEUT%: 77.7 % — ABNORMAL HIGH (ref 38.4–76.8)
NEUTROS ABS: 6.6 10*3/uL — AB (ref 1.5–6.5)
Platelets: 248 10*3/uL (ref 145–400)
RBC: 3.94 10*6/uL (ref 3.70–5.45)
RDW: 14.6 % — AB (ref 11.2–14.5)
WBC: 8.5 10*3/uL (ref 3.9–10.3)
lymph#: 1 10*3/uL (ref 0.9–3.3)

## 2017-04-30 ENCOUNTER — Telehealth: Payer: Self-pay | Admitting: Emergency Medicine

## 2017-04-30 NOTE — Telephone Encounter (Signed)
Ladell Pier, MD  [facility] 2        Please call patient, hemoglobin is normal, decrease prednisone to 5mg  daily, f/u as scheduled    Called this patient regarding this note. Patient VM did not answer and unable to leave a VM d/t being full mailbox. Will try again at a later time.

## 2017-05-13 ENCOUNTER — Other Ambulatory Visit: Payer: Self-pay | Admitting: Oncology

## 2017-05-20 ENCOUNTER — Ambulatory Visit (HOSPITAL_BASED_OUTPATIENT_CLINIC_OR_DEPARTMENT_OTHER): Payer: Medicare Other | Admitting: Nurse Practitioner

## 2017-05-20 ENCOUNTER — Other Ambulatory Visit (HOSPITAL_BASED_OUTPATIENT_CLINIC_OR_DEPARTMENT_OTHER): Payer: Medicare Other

## 2017-05-20 ENCOUNTER — Telehealth: Payer: Self-pay | Admitting: Oncology

## 2017-05-20 VITALS — BP 110/58 | HR 75 | Temp 98.7°F | Resp 17 | Ht 64.0 in | Wt 199.0 lb

## 2017-05-20 DIAGNOSIS — Z23 Encounter for immunization: Secondary | ICD-10-CM | POA: Diagnosis not present

## 2017-05-20 DIAGNOSIS — D591 Autoimmune hemolytic anemia, unspecified: Secondary | ICD-10-CM

## 2017-05-20 LAB — CBC WITH DIFFERENTIAL/PLATELET
BASO%: 1.4 % (ref 0.0–2.0)
BASOS ABS: 0.1 10*3/uL (ref 0.0–0.1)
EOS ABS: 0.2 10*3/uL (ref 0.0–0.5)
EOS%: 3.4 % (ref 0.0–7.0)
HEMATOCRIT: 37.2 % (ref 34.8–46.6)
HEMOGLOBIN: 12.4 g/dL (ref 11.6–15.9)
LYMPH#: 1.1 10*3/uL (ref 0.9–3.3)
LYMPH%: 15.4 % (ref 14.0–49.7)
MCH: 31.3 pg (ref 25.1–34.0)
MCHC: 33.4 g/dL (ref 31.5–36.0)
MCV: 93.9 fL (ref 79.5–101.0)
MONO#: 0.4 10*3/uL (ref 0.1–0.9)
MONO%: 4.9 % (ref 0.0–14.0)
NEUT#: 5.4 10*3/uL (ref 1.5–6.5)
NEUT%: 74.9 % (ref 38.4–76.8)
Platelets: 265 10*3/uL (ref 145–400)
RBC: 3.96 10*6/uL (ref 3.70–5.45)
RDW: 14.8 % — AB (ref 11.2–14.5)
WBC: 7.2 10*3/uL (ref 3.9–10.3)

## 2017-05-20 NOTE — Progress Notes (Addendum)
  [place] OFFICE PROGRESS NOTE   Diagnosis:  Autoimmune hemolytic anemia  INTERVAL HISTORY:   Michelle Wilcox returns as scheduled. She continues prednisone 7.5 mg daily. She overall is feeling well. Good energy level. She has noted recurrence of some joint/muscle pains coinciding with the prednisone taper. The discomfort is relieved with Tylenol.  Objective:  Vital signs in last 24 hours:  Blood pressure (!) 110/58, pulse 75, temperature 98.7 F (37.1 C), temperature source Oral, resp. rate 17, height 5\' 4"  (1.626 m), weight 199 lb (90.3 kg), SpO2 96 %.    HEENT: No thrush. Resp: Lungs clear bilaterally. Cardio: Regular rate and rhythm. GI: Abdomen soft and nontender. No hepatomegaly. Vascular: No leg edema.    Lab Results:  Lab Results  Component Value Date   WBC 7.2 05/20/2017   HGB 12.4 05/20/2017   HCT 37.2 05/20/2017   MCV 93.9 05/20/2017   PLT 265 05/20/2017   NEUTROABS 5.4 05/20/2017    Imaging:  No results found.  Medications: I have reviewed the patient's current medications.  Assessment/Plan: 1. 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5 mg daily 01/28/2017  Prednisone taper to 10 mg daily 02/19/2017  Prednisone taper to 7.5 mg daily 04/01/2017  Prednisone taper to 5 mg daily  05/20/2017  2. History ofExertional dyspnea secondary to #1  3. Mild splenomegaly on CT 03/16/2016  4. History of polymyalgia rheumatica  5. History of giant cell/temporal arteritis-maintained on methotrexate in the remote past, now on low dose prednisone    Disposition: Michelle Wilcox remains stable from a hematologic standpoint. She will taper prednisone to 5 mg daily with plans to remain at this dose long-term. She will return for a follow-up CBC in 4 weeks. Lab and follow-up visit in 8 weeks. She will contact the office in the interim with any problems.  Patient seen with Dr. Benay Spice.  Ned Card ANP/GNP-BC   05/20/2017  11:06 AM This was a shared visit with Ned Card. Michelle Wilcox is stable from a  Hematologic standpoint. We tapered the prednisone to the pre-hemolysis dose. [name], M.D.

## 2017-05-20 NOTE — Telephone Encounter (Signed)
Gave avs and calendar for October and November  °

## 2017-05-21 ENCOUNTER — Telehealth: Payer: Self-pay | Admitting: Oncology

## 2017-05-21 NOTE — Telephone Encounter (Signed)
Left message for patient regarding the addition of her appts per 10/2 sch msg. Sending her a confirmation letter as well.

## 2017-05-29 DIAGNOSIS — R21 Rash and other nonspecific skin eruption: Secondary | ICD-10-CM | POA: Diagnosis not present

## 2017-06-17 ENCOUNTER — Other Ambulatory Visit (HOSPITAL_BASED_OUTPATIENT_CLINIC_OR_DEPARTMENT_OTHER): Payer: Medicare Other

## 2017-06-17 DIAGNOSIS — D591 Autoimmune hemolytic anemia, unspecified: Secondary | ICD-10-CM

## 2017-06-17 LAB — CBC WITH DIFFERENTIAL/PLATELET
BASO%: 0.8 % (ref 0.0–2.0)
Basophils Absolute: 0.1 10*3/uL (ref 0.0–0.1)
EOS ABS: 0.2 10*3/uL (ref 0.0–0.5)
EOS%: 2.3 % (ref 0.0–7.0)
HEMATOCRIT: 39.9 % (ref 34.8–46.6)
HGB: 12.6 g/dL (ref 11.6–15.9)
LYMPH#: 1.4 10*3/uL (ref 0.9–3.3)
LYMPH%: 19.8 % (ref 14.0–49.7)
MCH: 30.7 pg (ref 25.1–34.0)
MCHC: 31.6 g/dL (ref 31.5–36.0)
MCV: 97.1 fL (ref 79.5–101.0)
MONO#: 0.5 10*3/uL (ref 0.1–0.9)
MONO%: 6.8 % (ref 0.0–14.0)
NEUT#: 5 10*3/uL (ref 1.5–6.5)
NEUT%: 70.3 % (ref 38.4–76.8)
PLATELETS: 237 10*3/uL (ref 145–400)
RBC: 4.11 10*6/uL (ref 3.70–5.45)
RDW: 14.4 % (ref 11.2–14.5)
WBC: 7.1 10*3/uL (ref 3.9–10.3)

## 2017-06-21 ENCOUNTER — Telehealth: Payer: Self-pay | Admitting: *Deleted

## 2017-06-21 NOTE — Telephone Encounter (Signed)
Telephone call to patient advised lab results as directed below. Patient states she did end up getting her "senior flu" shot after her last visit here and had an allergic reaction- rash, itch, swelling in arms, legs and ankles. Her PCP increased her dose of prednisone for 5 days and she will return to 5mg . Patient reports she is leary of getting the pneumonia shot because of this. She will discuss this with Dr. Benay Spice at her next appt.

## 2017-06-21 NOTE — Telephone Encounter (Signed)
-----   Message from Owens Shark, NP sent at 06/18/2017  1:41 PM EDT ----- please let her know hemoglobin is stable, f/u as scheduled

## 2017-06-24 DIAGNOSIS — J069 Acute upper respiratory infection, unspecified: Secondary | ICD-10-CM | POA: Diagnosis not present

## 2017-06-24 DIAGNOSIS — M79645 Pain in left finger(s): Secondary | ICD-10-CM | POA: Diagnosis not present

## 2017-07-08 DIAGNOSIS — R21 Rash and other nonspecific skin eruption: Secondary | ICD-10-CM | POA: Diagnosis not present

## 2017-07-08 DIAGNOSIS — M85852 Other specified disorders of bone density and structure, left thigh: Secondary | ICD-10-CM | POA: Diagnosis not present

## 2017-07-08 DIAGNOSIS — I10 Essential (primary) hypertension: Secondary | ICD-10-CM | POA: Diagnosis not present

## 2017-07-08 DIAGNOSIS — G479 Sleep disorder, unspecified: Secondary | ICD-10-CM | POA: Diagnosis not present

## 2017-07-08 DIAGNOSIS — Z23 Encounter for immunization: Secondary | ICD-10-CM | POA: Diagnosis not present

## 2017-07-08 DIAGNOSIS — D591 Other autoimmune hemolytic anemias: Secondary | ICD-10-CM | POA: Diagnosis not present

## 2017-07-08 DIAGNOSIS — M353 Polymyalgia rheumatica: Secondary | ICD-10-CM | POA: Diagnosis not present

## 2017-07-14 DIAGNOSIS — R509 Fever, unspecified: Secondary | ICD-10-CM | POA: Diagnosis not present

## 2017-07-14 DIAGNOSIS — J101 Influenza due to other identified influenza virus with other respiratory manifestations: Secondary | ICD-10-CM | POA: Diagnosis not present

## 2017-07-15 ENCOUNTER — Ambulatory Visit: Payer: Self-pay | Admitting: Oncology

## 2017-07-15 ENCOUNTER — Telehealth: Payer: Self-pay | Admitting: *Deleted

## 2017-07-15 ENCOUNTER — Ambulatory Visit: Payer: Self-pay

## 2017-07-15 ENCOUNTER — Other Ambulatory Visit: Payer: Self-pay

## 2017-07-15 NOTE — Telephone Encounter (Signed)
Received fax from [facility]: Pt called to cancel appts. She has been diagnosed with the flu. Message to schedulers to contact pt.

## 2017-07-17 ENCOUNTER — Telehealth: Payer: Self-pay | Admitting: Oncology

## 2017-07-17 NOTE — Telephone Encounter (Signed)
Spoke to patient regarding upcoming December appointments per 11/27 sch message.

## 2017-07-22 ENCOUNTER — Ambulatory Visit (HOSPITAL_BASED_OUTPATIENT_CLINIC_OR_DEPARTMENT_OTHER): Payer: Medicare Other | Admitting: Nurse Practitioner

## 2017-07-22 ENCOUNTER — Ambulatory Visit: Payer: Medicare Other

## 2017-07-22 ENCOUNTER — Telehealth: Payer: Self-pay | Admitting: Nurse Practitioner

## 2017-07-22 ENCOUNTER — Other Ambulatory Visit (HOSPITAL_BASED_OUTPATIENT_CLINIC_OR_DEPARTMENT_OTHER): Payer: Medicare Other

## 2017-07-22 ENCOUNTER — Encounter: Payer: Self-pay | Admitting: Nurse Practitioner

## 2017-07-22 VITALS — BP 125/55 | HR 80 | Temp 99.1°F | Resp 17 | Ht 64.0 in | Wt 196.0 lb

## 2017-07-22 DIAGNOSIS — D591 Autoimmune hemolytic anemia, unspecified: Secondary | ICD-10-CM

## 2017-07-22 LAB — CBC WITH DIFFERENTIAL/PLATELET
BASO%: 0.3 % (ref 0.0–2.0)
Basophils Absolute: 0 10*3/uL (ref 0.0–0.1)
EOS%: 1.6 % (ref 0.0–7.0)
Eosinophils Absolute: 0.1 10*3/uL (ref 0.0–0.5)
HEMATOCRIT: 35.4 % (ref 34.8–46.6)
HGB: 11.1 g/dL — ABNORMAL LOW (ref 11.6–15.9)
LYMPH#: 0.7 10*3/uL — AB (ref 0.9–3.3)
LYMPH%: 9.9 % — ABNORMAL LOW (ref 14.0–49.7)
MCH: 30.8 pg (ref 25.1–34.0)
MCHC: 31.4 g/dL — AB (ref 31.5–36.0)
MCV: 98.3 fL (ref 79.5–101.0)
MONO#: 0.3 10*3/uL (ref 0.1–0.9)
MONO%: 3.6 % (ref 0.0–14.0)
NEUT#: 5.8 10*3/uL (ref 1.5–6.5)
NEUT%: 84.6 % — AB (ref 38.4–76.8)
PLATELETS: 248 10*3/uL (ref 145–400)
RBC: 3.6 10*6/uL — ABNORMAL LOW (ref 3.70–5.45)
RDW: 15 % — AB (ref 11.2–14.5)
WBC: 6.9 10*3/uL (ref 3.9–10.3)

## 2017-07-22 LAB — RETICULOCYTES (CHCC)
Immature Retic Fract: 14.4 % — ABNORMAL HIGH (ref 1.60–10.00)
RBC: 3.56 10*6/uL — ABNORMAL LOW (ref 3.70–5.45)
RETIC CT ABS: 204.7 10*3/uL — AB (ref 33.70–90.70)
Retic %: 5.75 % — ABNORMAL HIGH (ref 0.70–2.10)

## 2017-07-22 NOTE — Progress Notes (Addendum)
[place] OFFICE PROGRESS NOTE   Diagnosis: Autoimmune hemolytic anemia  INTERVAL HISTORY:   [name] returns for follow-up.  She continues prednisone 5 mg daily.  She reports a recent positive influenza swab.  She completed a course of Tamiflu.  She presented with fever and cough.  The cough persists, mainly at nighttime.  She takes Delsym cough syrup as needed.  No further fever.  She notes some shortness of breath with wheezing and is utilizing an inhaler.  Objective:  Vital signs in last 24 hours:  Blood pressure (!) 125/55, pulse 80, temperature 99.1 F (37.3 C), temperature source Oral, resp. rate 17, height 5\' 4"  (1.626 m), weight 196 lb (88.9 kg), SpO2 95 %.    HEENT: No thrush or ulcers. Resp: Lungs clear bilaterally.  No respiratory distress. Cardio: Regular rate and rhythm. GI: Abdomen soft and nontender.  No hepatosplenomegaly. Vascular: No leg edema.  Lab Results:  Lab Results  Component Value Date   WBC 6.9 07/22/2017   HGB 11.1 (L) 07/22/2017   HCT 35.4 07/22/2017   MCV 98.3 07/22/2017   PLT 248 07/22/2017   NEUTROABS 5.8 07/22/2017    Imaging:  No results found.  Medications: I have reviewed the patient's current medications.  Assessment/Plan: 1. 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5  mg daily 01/28/2017  Prednisone taper to 10 mg daily 02/19/2017  Prednisone taper to 7.5 mg daily 04/01/2017  Prednisone taper to 5 mg daily 05/20/2017  2. History ofExertional dyspnea secondary to #1  3. Mild splenomegaly on CT 03/16/2016  4. History of polymyalgia rheumatica  5. History of giant cell/temporal arteritis-maintained on methotrexate in the remote past, now on low dose prednisone   Disposition: Michelle Wilcox appears stable.  She will continue symptomatic/supportive care for her symptoms following the recent influenza diagnosis.  She understands to contact our office or her PCP with worsening symptoms or recurrent fever.  We reviewed today's labs.  Her hemoglobin has declined.  This may be related to the recent influenza infection.  She will continue prednisone 5 mg daily.  She will return for follow-up labs in 1 week.  We will see her in a follow-up visit in 2 weeks.  She will contact the office in the interim as outlined above or with any other problems.  Patient seen with Dr. Benay Spice.    Ned Card ANP/GNP-BC   07/22/2017  3:24 PM This was a shared visit with Ned Card.  [name] is recovering from an upper respiratory infection and completed a course of Tamiflu after testing positive for influenza.  The hemoglobin is lower and she is mounting a reticulocytosis.  The progressive anemia may be related to the recent infection or recurrence of the autoimmune hemolysis.  She will return for a lab visit next week.  We will increase the prednisone dose if the hemoglobin falls.  [name], MD

## 2017-07-22 NOTE — Telephone Encounter (Signed)
Gave avs and calendar to [facility] for patient. Patient had flu

## 2017-07-24 ENCOUNTER — Other Ambulatory Visit: Payer: Self-pay

## 2017-07-24 ENCOUNTER — Ambulatory Visit: Payer: Self-pay

## 2017-07-24 ENCOUNTER — Ambulatory Visit: Payer: Self-pay | Admitting: Nurse Practitioner

## 2017-07-29 ENCOUNTER — Other Ambulatory Visit: Payer: Medicare Other

## 2017-08-03 ENCOUNTER — Encounter (HOSPITAL_COMMUNITY): Payer: Self-pay | Admitting: Emergency Medicine

## 2017-08-03 ENCOUNTER — Inpatient Hospital Stay (HOSPITAL_COMMUNITY)
Admission: EM | Admit: 2017-08-03 | Discharge: 2017-08-07 | DRG: 291 | Disposition: A | Discharge status: Home / Self Care | Payer: Medicare Other | Attending: Internal Medicine | Admitting: Internal Medicine

## 2017-08-03 ENCOUNTER — Emergency Department (HOSPITAL_COMMUNITY): Payer: Medicare Other

## 2017-08-03 ENCOUNTER — Other Ambulatory Visit: Payer: Self-pay

## 2017-08-03 DIAGNOSIS — I5031 Acute diastolic (congestive) heart failure: Secondary | ICD-10-CM | POA: Diagnosis present

## 2017-08-03 DIAGNOSIS — D591 Autoimmune hemolytic anemia, unspecified: Secondary | ICD-10-CM

## 2017-08-03 DIAGNOSIS — R600 Localized edema: Secondary | ICD-10-CM | POA: Diagnosis not present

## 2017-08-03 DIAGNOSIS — R197 Diarrhea, unspecified: Secondary | ICD-10-CM | POA: Diagnosis present

## 2017-08-03 DIAGNOSIS — I110 Hypertensive heart disease with heart failure: Secondary | ICD-10-CM | POA: Diagnosis not present

## 2017-08-03 DIAGNOSIS — T502X5A Adverse effect of carbonic-anhydrase inhibitors, benzothiadiazides and other diuretics, initial encounter: Secondary | ICD-10-CM | POA: Diagnosis not present

## 2017-08-03 DIAGNOSIS — J9811 Atelectasis: Secondary | ICD-10-CM | POA: Diagnosis not present

## 2017-08-03 DIAGNOSIS — F419 Anxiety disorder, unspecified: Secondary | ICD-10-CM | POA: Diagnosis present

## 2017-08-03 DIAGNOSIS — Z79899 Other long term (current) drug therapy: Secondary | ICD-10-CM

## 2017-08-03 DIAGNOSIS — F329 Major depressive disorder, single episode, unspecified: Secondary | ICD-10-CM | POA: Diagnosis present

## 2017-08-03 DIAGNOSIS — Z888 Allergy status to other drugs, medicaments and biological substances status: Secondary | ICD-10-CM

## 2017-08-03 DIAGNOSIS — M353 Polymyalgia rheumatica: Secondary | ICD-10-CM | POA: Diagnosis not present

## 2017-08-03 DIAGNOSIS — R161 Splenomegaly, not elsewhere classified: Secondary | ICD-10-CM | POA: Diagnosis present

## 2017-08-03 DIAGNOSIS — E876 Hypokalemia: Secondary | ICD-10-CM | POA: Diagnosis present

## 2017-08-03 DIAGNOSIS — Z880 Allergy status to penicillin: Secondary | ICD-10-CM

## 2017-08-03 DIAGNOSIS — E785 Hyperlipidemia, unspecified: Secondary | ICD-10-CM | POA: Diagnosis present

## 2017-08-03 DIAGNOSIS — D5919 Other autoimmune hemolytic anemia: Secondary | ICD-10-CM

## 2017-08-03 DIAGNOSIS — E8770 Fluid overload, unspecified: Secondary | ICD-10-CM | POA: Diagnosis present

## 2017-08-03 DIAGNOSIS — J45909 Unspecified asthma, uncomplicated: Secondary | ICD-10-CM | POA: Diagnosis present

## 2017-08-03 DIAGNOSIS — Z7952 Long term (current) use of systemic steroids: Secondary | ICD-10-CM

## 2017-08-03 DIAGNOSIS — R609 Edema, unspecified: Secondary | ICD-10-CM

## 2017-08-03 DIAGNOSIS — D589 Hereditary hemolytic anemia, unspecified: Secondary | ICD-10-CM | POA: Diagnosis present

## 2017-08-03 DIAGNOSIS — I10 Essential (primary) hypertension: Secondary | ICD-10-CM | POA: Diagnosis present

## 2017-08-03 DIAGNOSIS — Z882 Allergy status to sulfonamides status: Secondary | ICD-10-CM

## 2017-08-03 LAB — CBC
HCT: 27.8 % — ABNORMAL LOW (ref 36.0–46.0)
HEMOGLOBIN: 8.8 g/dL — AB (ref 12.0–15.0)
MCH: 31.7 pg (ref 26.0–34.0)
MCHC: 31.7 g/dL (ref 30.0–36.0)
MCV: 100 fL (ref 78.0–100.0)
PLATELETS: 223 10*3/uL (ref 150–400)
RBC: 2.78 MIL/uL — AB (ref 3.87–5.11)
RDW: 15.4 % (ref 11.5–15.5)
WBC: 6.1 10*3/uL (ref 4.0–10.5)

## 2017-08-03 LAB — BASIC METABOLIC PANEL
ANION GAP: 7 (ref 5–15)
BUN: 15 mg/dL (ref 6–20)
CALCIUM: 8.4 mg/dL — AB (ref 8.9–10.3)
CO2: 28 mmol/L (ref 22–32)
CREATININE: 0.56 mg/dL (ref 0.44–1.00)
Chloride: 106 mmol/L (ref 101–111)
Glucose, Bld: 87 mg/dL (ref 65–99)
Potassium: 3.1 mmol/L — ABNORMAL LOW (ref 3.5–5.1)
SODIUM: 141 mmol/L (ref 135–145)

## 2017-08-03 LAB — URINALYSIS, ROUTINE W REFLEX MICROSCOPIC
Bilirubin Urine: NEGATIVE
Glucose, UA: NEGATIVE mg/dL
HGB URINE DIPSTICK: NEGATIVE
Leukocytes, UA: NEGATIVE
NITRITE: NEGATIVE
Protein, ur: NEGATIVE mg/dL
Specific Gravity, Urine: 1.02 (ref 1.005–1.030)
pH: 5.5 (ref 5.0–8.0)

## 2017-08-03 LAB — CBG MONITORING, ED: GLUCOSE-CAPILLARY: 64 mg/dL — AB (ref 65–99)

## 2017-08-03 LAB — BRAIN NATRIURETIC PEPTIDE: B NATRIURETIC PEPTIDE 5: 282.5 pg/mL — AB (ref 0.0–100.0)

## 2017-08-03 MED ORDER — POTASSIUM CHLORIDE CRYS ER 20 MEQ PO TBCR
40.0000 meq | EXTENDED_RELEASE_TABLET | Freq: Once | ORAL | Status: AC
  Administered 2017-08-03: 40 meq via ORAL
  Filled 2017-08-03: qty 2

## 2017-08-03 NOTE — ED Provider Notes (Signed)
[facility] DEPT Provider Note   CSN: [id number] Arrival date & time: 08/03/17  1801     History   Chief Complaint Chief Complaint  Patient presents with  . Leg Swelling  . Shortness of Breath    HPI Michelle Wilcox is a 71 y.o. female.  HPI Over the past 2 days patient is started to develop increasing swelling that is going up her legs.  She reports over the course of the past week she was feeling much more fatigued than usual.  She is also starting to develop some exertional shortness of breath.  No associated chest pain or fever although she does report she is just getting over a flulike illness that had a lot of coughing associated with it.  Patient took a course of Tamiflu.  Patient has history of multiple autoimmune disorders including hemolytic anemia.  She reports that when she has had anemic episodes, she is required higher dose steroids.  Patient has been tapered down to taking daily 10 mg of prednisone.  She reports in the past however when she is gotten tapered down to lower doses is when she usually gets a flare and has to be treated again.  She has had rituximab infusions.  Patient reports that she was taking hydrochlorothiazide up until about 3 weeks ago.  She reports she developed some rash on her arms and it was thought to be possibly allergic reaction to the hydrochlorothiazide. Past Medical History:  Diagnosis Date  . Anxiety   . Depression   . Giant cell arteritis ([place])   . Gouty arthropathy   . Hemolytic anemia ([place])   . Hyperlipidemia   . Hypertension   . Migraines   . Polymyalgia ([place])   . Polymyalgia rheumatica ([place])   . Temporal arteritis [facility])     Patient Active Problem List   Diagnosis Date Noted  . Volume overload 08/03/2017  . Hypokalemia 08/03/2017  . Hypertension 08/03/2017  . Hyperlipidemia 08/03/2017  . Autoimmune hemolytic anemia ([place]) 11/20/2016  . Hypersensitivity reaction 11/20/2016  . Hemolytic  anemia ([place]) 03/19/2016  . Breast mass, right 07/01/2012    Past Surgical History:  Procedure Laterality Date  . BARTHOLIN GLAND CYST EXCISION     non ca  . EYE MUSCLE SURGERY     as a child 45 yrs old  . KNEE SURGERY  2004   rt knee  . toncil    . TONSILLECTOMY     as a child    OB History    No data available       Home Medications    Prior to Admission medications   Medication Sig Start Date End Date Taking? Authorizing Provider  acetaminophen (TYLENOL) 650 MG CR tablet Take 1,300 mg by mouth 2 (two) times daily. Two in AM and two in PM.    Yes [provider]  cholecalciferol (VITAMIN D) 1000 UNITS tablet Take 2,000 Units by mouth daily.    Yes [provider]  folic acid (FOLVITE) 1 MG tablet TAKE 1 TABLET BY MOUTH DAILY 05/14/17  Yes Ladell Pier, MD  loperamide (IMODIUM) 2 MG capsule Take 2 mg by mouth as needed for diarrhea or loose stools.    Yes [provider]  metoprolol (LOPRESSOR) 50 MG tablet Take 25-50 mg by mouth 2 (two) times daily. Takes 50 mg in AM and 25 mg in PM, may take an additional 25 mg prf high blood pressure. Pat had 25 mg of metoprolol  at 1400 and is considering taking the other 25 mg.   Yes [provider]  Multiple Vitamin (MULTIVITAMIN) tablet Take 1 tablet by mouth every evening.    Yes [provider]  omeprazole (PRILOSEC OTC) 20 MG tablet Take 20 mg by mouth every morning.    Yes [provider]  predniSONE (DELTASONE) 5 MG tablet Take 1 tablet (5 mg total) by mouth daily with breakfast. Take with 2.5 mg tablet for total dose 7.5 mg Patient taking differently: Take 5 mg by mouth daily with breakfast.  04/01/17  Yes Ladell Pier, MD  simethicone (GAS-X) 80 MG chewable tablet Chew 80 mg by mouth every 6 (six) hours as needed for flatulence.    Yes [provider]  VENTOLIN HFA 108 (90 Base) MCG/ACT inhaler Take 1-2 puffs by mouth every 4 (four) hours as needed for shortness of  breath. 06/24/17  Yes [provider]  predniSONE (DELTASONE) 2.5 MG tablet Take 1 tablet (2.5 mg total) by mouth daily with breakfast. Patient not taking: Reported on 08/03/2017 04/01/17   Ladell Pier, MD    Family History Family History  Adopted: Yes  Problem Relation Age of Onset  . Arthritis Mother   . Stroke Mother     Social History Social History   Tobacco Use  . Smoking status: Never Smoker  . Smokeless tobacco: Never Used  Substance Use Topics  . Alcohol use: Yes    Comment: rarely   . Drug use: No     Allergies   Penicillins; Ceftin [cefuroxime axetil]; Ciprofloxacin; Darvocet [propoxyphene n-acetaminophen]; Levaquin [levofloxacin in d5w]; Sulfa antibiotics; and Zyrtec [cetirizine hcl]   Review of Systems Review of Systems 10 Systems reviewed and are negative for acute change except as noted in the HPI.   Physical Exam Updated Vital Signs BP (!) 145/60   Pulse 81   Temp 98.6 F (37 C) (Oral)   Resp 18   Ht 5\' 4"  (1.626 m)   Wt 88.9 kg (196 lb)   SpO2 95%   BMI 33.64 kg/m   Physical Exam  Constitutional: She is oriented to person, place, and time.  Patient is alert and nontoxic.  No respiratory distress at rest.  HENT:  Head: Normocephalic and atraumatic.  Nose: Nose normal.  Mouth/Throat: Oropharynx is clear and moist.  Eyes: EOM are normal. Pupils are equal, round, and reactive to light.  Neck: Neck supple.  Cardiovascular: Normal rate, regular rhythm, normal heart sounds and intact distal pulses.  Pulmonary/Chest: Effort normal and breath sounds normal.  Abdominal: Soft. She exhibits no distension. There is no tenderness. There is no guarding.  Musculoskeletal: Normal range of motion. She exhibits edema.  2+ pitting edema bilateral feet and lower legs.  Neurological: She is alert and oriented to person, place, and time. She exhibits normal muscle tone. Coordination normal.  Skin: Skin is warm and dry. No rash noted.  Psychiatric:  She has a normal mood and affect.     ED Treatments / Results  Labs (all labs ordered are listed, but only abnormal results are displayed) Labs Reviewed  BASIC METABOLIC PANEL - Abnormal; Notable for the following components:      Result Value   Potassium 3.1 (*)    Calcium 8.4 (*)    All other components within normal limits  CBC - Abnormal; Notable for the following components:   RBC 2.78 (*)    Hemoglobin 8.8 (*)    HCT 27.8 (*)    All  other components within normal limits  URINALYSIS, ROUTINE W REFLEX MICROSCOPIC - Abnormal; Notable for the following components:   Ketones, ur TRACE (*)    All other components within normal limits  BRAIN NATRIURETIC PEPTIDE - Abnormal; Notable for the following components:   B Natriuretic Peptide 282.5 (*)    All other components within normal limits  CBG MONITORING, ED - Abnormal; Notable for the following components:   Glucose-Capillary 64 (*)    All other components within normal limits  TYPE AND SCREEN    EKG  EKG Interpretation  Date/Time:  Saturday August 03 2017 18:20:03 EST Ventricular Rate:  83 PR Interval:    QRS Duration: 91 QT Interval:  373 QTC Calculation: 439 R Axis:   12 Text Interpretation:  Sinus rhythm Low voltage, precordial leads Baseline wander in lead(s) V6 no acute ischemic appearance. no change from previous Confirmed by Charlesetta Shanks [phone]) on 08/03/2017 9:57:46 PM       Radiology Dg Chest 2 View  Result Date: 08/03/2017 CLINICAL DATA:  Dyspnea times 3-4 days with hypertension. History of hemolytic anemia. Nonsmoker. EXAM: CHEST  2 VIEW COMPARISON:  12/17/2016 FINDINGS: Stable cardiomegaly. Tortuous thoracic aorta with atherosclerotic calcifications at the arch. Minimal atelectasis at the right lung base. No effusion or pneumothorax. No pulmonary consolidations. Stable mild degenerative change along the dorsal spine and both shoulders. IMPRESSION: Stable cardiomegaly and aortic atherosclerosis. No  active pulmonary disease. Electronically Signed   By: Ashley Royalty M.D.   On: 08/03/2017 18:38    Procedures Procedures (including critical care time)  Medications Ordered in ED Medications  potassium chloride SA (K-DUR,KLOR-CON) CR tablet 40 mEq (40 mEq Oral Given 08/03/17 2317)     Initial Impression / Assessment and Plan / ED Course  I have reviewed the triage vital signs and the nursing notes.  Pertinent labs & imaging results that were available during my care of the patient were reviewed by me and considered in my medical decision making (see chart for details).    Consult: Dr. Olevia Bowens for admission.  Final Clinical Impressions(s) / ED Diagnoses   Final diagnoses:  Other autoimmune hemolytic anemias ([place])  Peripheral edema  Hypervolemia, unspecified hypervolemia type  Hypokalemia   Patient presents as on the above.  She has had significant drop in hemoglobin over the past 10 days.  It appears she is having exacerbation of her hemolytic anemia.  She also has developed peripheral edema and dyspnea.  At this time will require further evaluation to determine if volume overload is due to congestive heart failure versus other etiology.  As well she will require monitoring of CBC for a fairly precipitous exacerbation of her hemolytic anemia. ED Discharge Orders    None       Charlesetta Shanks, MD 08/04/17 [phone]

## 2017-08-03 NOTE — ED Triage Notes (Signed)
Pt reports she has had bilateral leg swelling, SOB, and generalized weakness for the past week. Pt also has hx of hemolytic anemia.

## 2017-08-03 NOTE — H&P (Signed)
History and Physical    Michelle Wilcox YBO:[id number] DOB: 1945/11/01 DOA: 08/03/2017  PCP: Cari Caraway, MD  Hematologist: Betsy Coder, MD Patient coming from: Home.  I have personally briefly reviewed patient's old medical records in [place]  Chief Complaint: Lower extremity swelling, S OB and weakness.  HPI: Michelle Wilcox is a 71 y.o. female with medical history significant of anxiety, depression, giant cell arteritis, gouty arthropathy, hemolytic anemia, hyperlipidemia, hypertension, migraine headaches, polymyalgia rheumatica, temporal arteritis who is coming to the emergency department with complaints of bilateral lower extremity edema, dyspnea, mild orthopnea and generalized weakness for the past week.  She mentions that she was diagnosed with influenza about 3 weeks ago, but was started on Tamiflu right away and her symptoms mostly resolved, except for a persistent dry cough. She denies fever, chills, sore throat, wheezing or hemoptysis.  No chest pain, palpitations, dizziness, diaphoresis or PND.  Complains of occasional diarrhea, but denies abdominal pain, nausea, emesis, constipation, melena or hematochezia.  She has not had loose stools since 2 days ago.  She denies dysuria, frequency or hematuria.  No polyuria, polydipsia or polyphagia.  Denies blurred vision.  ED Course: Initial vital signs temperature 37 C, pulse 90, respirations 20, blood pressure 173/85 mmHg and O2 sat 98% on room air.  She was given K Dur 40 mEq p.o. x1 in the ED.  Her urinalysis showed trace ketones, but otherwise was unremarkable.  Her WBC was 6.1, platelets 223 and her hemoglobin was 8.8, which is decreased from 11.1 g/dL on 08/01/2017.  Prior to this, the patient states that her hemoglobin was 12.5 and before this is 13.5 g/dL.  This was at about 6 months after finishing rituximab infusions.  Her BMP shows a potassium of 3.1 mmol/L and calcium of 8.4 mg/dL.  All other  values are normal.  BNP was 282.5 pg/mL.  Chest radiograph shows stable cardiomegaly and aortic atherosclerosis.  No active pulmonary disease.  Review of Systems: As per HPI otherwise 10 point review of systems negative.    Past Medical History:  Diagnosis Date  . Anxiety   . Depression   . Giant cell arteritis ([place])   . Gouty arthropathy   . Hemolytic anemia (Michelle Wilcox)   . Hyperlipidemia   . Hypertension   . Migraines   . Polymyalgia ([place])   . Polymyalgia rheumatica ([place])   . Temporal arteritis [facility])     Past Surgical History:  Procedure Laterality Date  . BARTHOLIN GLAND CYST EXCISION     non ca  . EYE MUSCLE SURGERY     as a child 33 yrs old  . KNEE SURGERY  2004   rt knee  . toncil    . TONSILLECTOMY     as a child     reports that  has never smoked. she has never used smokeless tobacco. She reports that she drinks alcohol. She reports that she does not use drugs.  Allergies  Allergen Reactions  . Penicillins Anaphylaxis    Has patient had a PCN reaction causing immediate rash, facial/tongue/throat swelling, SOB or lightheadedness with hypotension: yes Has patient had a PCN reaction causing severe rash involving mucus membranes or skin necrosis: no Has patient had a PCN reaction that required hospitalization: yes Has patient had a PCN reaction occurring within the last 10 years: no If all of the above answers are "NO", then may proceed with Cephalosporin use.   . Ceftin [Cefuroxime  Axetil]   . Ciprofloxacin   . Darvocet [Propoxyphene N-Acetaminophen]   . Levaquin [Levofloxacin In D5w] Hives  . Sulfa Antibiotics Hives  . Zyrtec [Cetirizine Hcl]     headache    Family History  Adopted: Yes  Problem Relation Age of Onset  . Arthritis Mother   . Stroke Mother     Prior to Admission medications   Medication Sig Start Date End Date Taking? Authorizing Provider  acetaminophen (TYLENOL) 650 MG CR tablet Take 1,300 mg by mouth 2 (two) times daily. Two in AM and two  in PM.    Yes [provider]  cholecalciferol (VITAMIN D) 1000 UNITS tablet Take 2,000 Units by mouth daily.    Yes [provider]  folic acid (FOLVITE) 1 MG tablet TAKE 1 TABLET BY MOUTH DAILY 05/14/17  Yes Ladell Pier, MD  loperamide (IMODIUM) 2 MG capsule Take 2 mg by mouth as needed for diarrhea or loose stools.    Yes [provider]  metoprolol (LOPRESSOR) 50 MG tablet Take 25-50 mg by mouth 2 (two) times daily. Takes 50 mg in AM and 25 mg in PM, may take an additional 25 mg prf high blood pressure. Pat had 25 mg of metoprolol at 1400 and is considering taking the other 25 mg.   Yes [provider]  Multiple Vitamin (MULTIVITAMIN) tablet Take 1 tablet by mouth every evening.    Yes [provider]  omeprazole (PRILOSEC OTC) 20 MG tablet Take 20 mg by mouth every morning.    Yes [provider]  predniSONE (DELTASONE) 5 MG tablet Take 1 tablet (5 mg total) by mouth daily with breakfast. Take with 2.5 mg tablet for total dose 7.5 mg Patient taking differently: Take 5 mg by mouth daily with breakfast.  04/01/17  Yes Ladell Pier, MD  simethicone (GAS-X) 80 MG chewable tablet Chew 80 mg by mouth every 6 (six) hours as needed for flatulence.    Yes [provider]  VENTOLIN HFA 108 (90 Base) MCG/ACT inhaler Take 1-2 puffs by mouth every 4 (four) hours as needed for shortness of breath. 06/24/17  Yes [provider]  predniSONE (DELTASONE) 2.5 MG tablet Take 1 tablet (2.5 mg total) by mouth daily with breakfast. Patient not taking: Reported on 08/03/2017 04/01/17   Ladell Pier, MD    Physical Exam: Vitals:   08/03/17 1812 08/03/17 2110 08/03/17 2252 08/03/17 2317  BP: (!) 173/85 (!) 156/80 (!) 145/60   Pulse: 90 82 81   Resp: 20 20 18    Temp: 98.6 F (37 C)     TempSrc: Oral     SpO2: 98% 99% 95%   Weight:    88.9 kg (196 lb)  Height:    5\' 4"  (1.626 m)    Constitutional: NAD, calm, comfortable Eyes:  PERRL, lids and conjunctivae normal ENMT: Mucous membranes are moist. Posterior pharynx clear of any exudate or lesions. Neck: normal, supple, no masses, no thyromegaly Respiratory: clear to auscultation bilaterally, no wheezing, no crackles. Normal respiratory effort. No accessory muscle use.  Cardiovascular: Regular rate and rhythm, no murmurs / rubs / gallops.  3+ bilateral lower extremity edema. 2+ pedal pulses. No carotid bruits.  Abdomen: Soft, mild RUQ tenderness, no guarding/rebound/masses palpated. No hepatosplenomegaly. Bowel sounds positive.  Musculoskeletal: no clubbing / cyanosis.  Good ROM, no contractures. Normal muscle tone.  Skin: Positive erythematous rash on both forearms. Neurologic: CN 2-12 grossly intact. Sensation intact, DTR normal. Strength 5/5 in all  4.  Psychiatric: Normal judgment and insight. Alert and oriented x 3. Normal mood.    Labs on Admission: I have personally reviewed following labs and imaging studies  CBC: Recent Labs  Lab 08/03/17 2159  WBC 6.1  HGB 8.8*  HCT 27.8*  MCV 100.0  PLT 161   Basic Metabolic Panel: Recent Labs  Lab 08/03/17 2159  NA 141  K 3.1*  CL 106  CO2 28  GLUCOSE 87  BUN 15  CREATININE 0.56  CALCIUM 8.4*   GFR: Estimated Creatinine Clearance: 68.6 mL/min (by C-G formula based on SCr of 0.56 mg/dL). Liver Function Tests: No results for input(s): AST, ALT, ALKPHOS, BILITOT, PROT, ALBUMIN in the last 168 hours. No results for input(s): LIPASE, AMYLASE in the last 168 hours. No results for input(s): AMMONIA in the last 168 hours. Coagulation Profile: No results for input(s): INR, PROTIME in the last 168 hours. Cardiac Enzymes: No results for input(s): CKTOTAL, CKMB, CKMBINDEX, TROPONINI in the last 168 hours. BNP (last 3 results) No results for input(s): PROBNP in the last 8760 hours. HbA1C: No results for input(s): HGBA1C in the last 72 hours. CBG: Recent Labs  Lab 08/03/17 2106  GLUCAP 64*   Lipid  Profile: No results for input(s): CHOL, HDL, LDLCALC, TRIG, CHOLHDL, LDLDIRECT in the last 72 hours. Thyroid Function Tests: No results for input(s): TSH, T4TOTAL, FREET4, T3FREE, THYROIDAB in the last 72 hours. Anemia Panel: No results for input(s): VITAMINB12, FOLATE, FERRITIN, TIBC, IRON, RETICCTPCT in the last 72 hours. Urine analysis:    Component Value Date/Time   LABSPEC 1.025 03/27/2016 0816   PHURINE 6.0 03/27/2016 0816   GLUCOSEU Negative 03/27/2016 0816   HGBUR Negative 03/27/2016 0816   BILIRUBINUR Positive by Dipstix 03/27/2016 0816   KETONESUR Negative 03/27/2016 0816   PROTEINUR Negative 03/27/2016 0816   UROBILINOGEN 0.2 03/27/2016 0816   NITRITE Negative 03/27/2016 0816   LEUKOCYTESUR Small 03/27/2016 0816    Radiological Exams on Admission: Dg Chest 2 View  Result Date: 08/03/2017 CLINICAL DATA:  Dyspnea times 3-4 days with hypertension. History of hemolytic anemia. Nonsmoker. EXAM: CHEST  2 VIEW COMPARISON:  12/17/2016 FINDINGS: Stable cardiomegaly. Tortuous thoracic aorta with atherosclerotic calcifications at the arch. Minimal atelectasis at the right lung base. No effusion or pneumothorax. No pulmonary consolidations. Stable mild degenerative change along the dorsal spine and both shoulders. IMPRESSION: Stable cardiomegaly and aortic atherosclerosis. No active pulmonary disease. Electronically Signed   By: Ashley Royalty M.D.   On: 08/03/2017 18:38    EKG: Independently reviewed.  Vent. rate 83 BPM PR interval * ms QRS duration 91 ms QT/QTc 373/439 ms P-R-T axes 44 12 60 Sinus rhythm Low voltage, precordial leads Baseline wander in lead(s) V6 no acute ischemic appearance. no change from previous  Assessment/Plan Principal Problem:   Volume overload Telemetry/observation. Supplemental oxygen as needed. Furosemide 20 mg IVP x1 dose. Continue metoprolol. Check echocardiogram in a.m. Monitor daily weights, intake and output.  Active Problems:    Hemolytic anemia (HCC) She was well controlled after Rituximab infusions earlier this year. Monitor hematocrit and hemoglobin. Single dose of Solu-Medrol 125 mg IVP given. Benadryl prn for forearms pruritic rash. Check LDH, reticulocyte count, haptoglobin and direct [place] test. Consult hematology in a.m. May need rituximab infusion.    Hypokalemia Replacing. Check magnesium level. Follow-up potassium level in a.m.     Mild reactive airways disease Has persisting dry cough since influenza episode. Should benefit from Solu-Medrol given for hemolytic anemia. Advised to use her albuterol  MDI as needed. Continue Delsym 30 mg p.o. at bedtime.    Hypertension Continue metoprolol 50 mg p.o. in the morning. Continue metoprolol 25 mg p.o. in the evening. Monitor heart rate and blood pressure.    Hyperlipidemia Currently not on medical therapy.   DVT prophylaxis: Lovenox SQ. Code Status: Full code. Family Communication:  Disposition Plan: Observation for volume overload/hemolytic anemia treatment and further workup. Consults called:  Admission status: Observation/telemetry.   Reubin Milan MD [facility] Pager [phone].  If 7PM-7AM, please contact night-coverage [email or web address] Password [facility]  08/03/2017, 11:42 PM

## 2017-08-04 ENCOUNTER — Observation Stay (HOSPITAL_BASED_OUTPATIENT_CLINIC_OR_DEPARTMENT_OTHER): Payer: Medicare Other

## 2017-08-04 ENCOUNTER — Encounter (HOSPITAL_COMMUNITY): Payer: Self-pay | Admitting: Internal Medicine

## 2017-08-04 ENCOUNTER — Observation Stay (HOSPITAL_COMMUNITY): Payer: Medicare Other

## 2017-08-04 DIAGNOSIS — J45909 Unspecified asthma, uncomplicated: Secondary | ICD-10-CM | POA: Diagnosis present

## 2017-08-04 DIAGNOSIS — D599 Acquired hemolytic anemia, unspecified: Secondary | ICD-10-CM

## 2017-08-04 DIAGNOSIS — I10 Essential (primary) hypertension: Secondary | ICD-10-CM

## 2017-08-04 DIAGNOSIS — M7989 Other specified soft tissue disorders: Secondary | ICD-10-CM | POA: Diagnosis not present

## 2017-08-04 DIAGNOSIS — I351 Nonrheumatic aortic (valve) insufficiency: Secondary | ICD-10-CM | POA: Diagnosis not present

## 2017-08-04 DIAGNOSIS — E8770 Fluid overload, unspecified: Secondary | ICD-10-CM | POA: Diagnosis not present

## 2017-08-04 LAB — CBC WITH DIFFERENTIAL/PLATELET
BASOS ABS: 0 10*3/uL (ref 0.0–0.1)
BASOS PCT: 0 %
EOS ABS: 0.1 10*3/uL (ref 0.0–0.7)
EOS PCT: 1 %
HCT: 29.9 % — ABNORMAL LOW (ref 36.0–46.0)
Hemoglobin: 9.5 g/dL — ABNORMAL LOW (ref 12.0–15.0)
Lymphocytes Relative: 7 %
Lymphs Abs: 0.5 10*3/uL — ABNORMAL LOW (ref 0.7–4.0)
MCH: 31.8 pg (ref 26.0–34.0)
MCHC: 31.8 g/dL (ref 30.0–36.0)
MCV: 100 fL (ref 78.0–100.0)
Monocytes Absolute: 0.1 10*3/uL (ref 0.1–1.0)
Monocytes Relative: 1 %
NEUTROS PCT: 91 %
Neutro Abs: 7.2 10*3/uL (ref 1.7–7.7)
PLATELETS: 243 10*3/uL (ref 150–400)
RBC: 2.99 MIL/uL — AB (ref 3.87–5.11)
RDW: 15.7 % — ABNORMAL HIGH (ref 11.5–15.5)
WBC: 7.8 10*3/uL (ref 4.0–10.5)

## 2017-08-04 LAB — MAGNESIUM: MAGNESIUM: 1.7 mg/dL (ref 1.7–2.4)

## 2017-08-04 LAB — FERRITIN: FERRITIN: 558 ng/mL — AB (ref 11–307)

## 2017-08-04 LAB — COMPREHENSIVE METABOLIC PANEL
ALBUMIN: 4.1 g/dL (ref 3.5–5.0)
ALT: 16 U/L (ref 14–54)
ANION GAP: 11 (ref 5–15)
AST: 38 U/L (ref 15–41)
Alkaline Phosphatase: 71 U/L (ref 38–126)
BILIRUBIN TOTAL: 1.2 mg/dL (ref 0.3–1.2)
BUN: 13 mg/dL (ref 6–20)
CHLORIDE: 103 mmol/L (ref 101–111)
CO2: 26 mmol/L (ref 22–32)
Calcium: 8.6 mg/dL — ABNORMAL LOW (ref 8.9–10.3)
Creatinine, Ser: 0.66 mg/dL (ref 0.44–1.00)
GFR calc Af Amer: >60 mL/min (ref >60)
GFR calc non Af Amer: >60 mL/min (ref >60)
GLUCOSE: 255 mg/dL — AB (ref 65–99)
POTASSIUM: 3.5 mmol/L (ref 3.5–5.1)
SODIUM: 140 mmol/L (ref 135–145)
Total Protein: 6.5 g/dL (ref 6.5–8.1)

## 2017-08-04 LAB — RETICULOCYTES
RBC.: 2.97 MIL/uL — AB (ref 3.87–5.11)
RETIC COUNT ABSOLUTE: 207.9 10*3/uL — AB (ref 19.0–186.0)
RETIC CT PCT: 7 % — AB (ref 0.4–3.1)

## 2017-08-04 LAB — ECHOCARDIOGRAM COMPLETE
Height: 64 in
Weight: 3164.04 oz

## 2017-08-04 LAB — LACTATE DEHYDROGENASE: LDH: 269 U/L — AB (ref 98–192)

## 2017-08-04 LAB — DIRECT ANTIGLOBULIN TEST (NOT AT ARMC)
DAT, COMPLEMENT: POSITIVE
DAT, IGG: POSITIVE

## 2017-08-04 LAB — VITAMIN B12: VITAMIN B 12: 635 pg/mL (ref 180–914)

## 2017-08-04 LAB — TSH: TSH: 0.661 u[IU]/mL (ref 0.350–4.500)

## 2017-08-04 LAB — FOLATE: FOLATE: 52 ng/mL (ref >5.9)

## 2017-08-04 LAB — IRON AND TIBC
Iron: 47 ug/dL (ref 28–170)
SATURATION RATIOS: 21 % (ref 10.4–31.8)
TIBC: 224 ug/dL — ABNORMAL LOW (ref 250–450)
UIBC: 177 ug/dL

## 2017-08-04 LAB — OCCULT BLOOD X 1 CARD TO LAB, STOOL: Fecal Occult Bld: POSITIVE — AB

## 2017-08-04 MED ORDER — ENOXAPARIN SODIUM 40 MG/0.4ML ~~LOC~~ SOLN
40.0000 mg | SUBCUTANEOUS | Status: DC
  Administered 2017-08-04 – 2017-08-07 (×4): 40 mg via SUBCUTANEOUS
  Filled 2017-08-04 (×4): qty 0.4

## 2017-08-04 MED ORDER — POTASSIUM CHLORIDE CRYS ER 20 MEQ PO TBCR
40.0000 meq | EXTENDED_RELEASE_TABLET | Freq: Every day | ORAL | Status: DC
  Administered 2017-08-04 – 2017-08-07 (×4): 40 meq via ORAL
  Filled 2017-08-04 (×4): qty 2

## 2017-08-04 MED ORDER — VITAMIN D3 25 MCG (1000 UNIT) PO TABS
2000.0000 [IU] | ORAL_TABLET | Freq: Every day | ORAL | Status: DC
  Administered 2017-08-04 – 2017-08-07 (×4): 2000 [IU] via ORAL
  Filled 2017-08-04 (×4): qty 2

## 2017-08-04 MED ORDER — ALPRAZOLAM 0.25 MG PO TABS: 0.2500 mg | ORAL_TABLET | Freq: Every evening | ORAL | Status: DC | PRN

## 2017-08-04 MED ORDER — MAGNESIUM SULFATE 2 GM/50ML IV SOLN
2.0000 g | Freq: Once | INTRAVENOUS | Status: AC
  Administered 2017-08-04: 2 g via INTRAVENOUS
  Filled 2017-08-04: qty 50

## 2017-08-04 MED ORDER — SIMETHICONE 80 MG PO CHEW: 80.0000 mg | CHEWABLE_TABLET | Freq: Four times a day (QID) | ORAL | Status: DC | PRN

## 2017-08-04 MED ORDER — FUROSEMIDE 10 MG/ML IJ SOLN
20.0000 mg | Freq: Once | INTRAMUSCULAR | Status: AC
  Administered 2017-08-04: 20 mg via INTRAVENOUS
  Filled 2017-08-04: qty 2

## 2017-08-04 MED ORDER — ACETAMINOPHEN 325 MG PO TABS
650.0000 mg | ORAL_TABLET | Freq: Once | ORAL | Status: AC
  Administered 2017-08-04: 650 mg via ORAL
  Filled 2017-08-04: qty 2

## 2017-08-04 MED ORDER — METOPROLOL TARTRATE 25 MG PO TABS
25.0000 mg | ORAL_TABLET | Freq: Every day | ORAL | Status: DC
  Administered 2017-08-04 – 2017-08-06 (×3): 25 mg via ORAL
  Filled 2017-08-04 (×3): qty 1

## 2017-08-04 MED ORDER — FOLIC ACID 1 MG PO TABS
2.0000 mg | ORAL_TABLET | Freq: Every day | ORAL | Status: DC
  Administered 2017-08-05 – 2017-08-07 (×3): 2 mg via ORAL
  Filled 2017-08-04 (×3): qty 2

## 2017-08-04 MED ORDER — PANTOPRAZOLE SODIUM 40 MG PO TBEC
40.0000 mg | DELAYED_RELEASE_TABLET | Freq: Every day | ORAL | Status: DC
  Administered 2017-08-04 – 2017-08-07 (×4): 40 mg via ORAL
  Filled 2017-08-04 (×4): qty 1

## 2017-08-04 MED ORDER — DIPHENHYDRAMINE HCL 25 MG PO CAPS
25.0000 mg | ORAL_CAPSULE | Freq: Four times a day (QID) | ORAL | Status: DC | PRN
  Administered 2017-08-05: 25 mg via ORAL
  Filled 2017-08-04: qty 1

## 2017-08-04 MED ORDER — FOLIC ACID 1 MG PO TABS
1.0000 mg | ORAL_TABLET | Freq: Every day | ORAL | Status: DC
  Administered 2017-08-04: 1 mg via ORAL
  Filled 2017-08-04: qty 1

## 2017-08-04 MED ORDER — DEXTROMETHORPHAN POLISTIREX ER 30 MG/5ML PO SUER
30.0000 mg | Freq: Every evening | ORAL | Status: DC | PRN
  Administered 2017-08-04 – 2017-08-06 (×4): 30 mg via ORAL
  Filled 2017-08-04 (×4): qty 5

## 2017-08-04 MED ORDER — METOPROLOL TARTRATE 25 MG PO TABS: 25.0000 mg | ORAL_TABLET | Freq: Every day | ORAL | Status: DC | PRN

## 2017-08-04 MED ORDER — METHYLPREDNISOLONE SODIUM SUCC 125 MG IJ SOLR
125.0000 mg | Freq: Once | INTRAMUSCULAR | Status: AC
  Administered 2017-08-04: 125 mg via INTRAVENOUS
  Filled 2017-08-04: qty 2

## 2017-08-04 MED ORDER — FUROSEMIDE 10 MG/ML IJ SOLN
40.0000 mg | Freq: Two times a day (BID) | INTRAMUSCULAR | Status: AC
  Administered 2017-08-04 – 2017-08-05 (×2): 40 mg via INTRAVENOUS
  Filled 2017-08-04 (×2): qty 4

## 2017-08-04 MED ORDER — PREDNISONE 20 MG PO TABS
60.0000 mg | ORAL_TABLET | Freq: Every day | ORAL | Status: DC
  Administered 2017-08-05 – 2017-08-07 (×3): 60 mg via ORAL
  Filled 2017-08-04 (×3): qty 3

## 2017-08-04 MED ORDER — LOPERAMIDE HCL 2 MG PO CAPS
2.0000 mg | ORAL_CAPSULE | ORAL | Status: DC | PRN
  Administered 2017-08-04 – 2017-08-06 (×4): 2 mg via ORAL
  Filled 2017-08-04 (×4): qty 1

## 2017-08-04 MED ORDER — ACETAMINOPHEN 325 MG PO TABS
650.0000 mg | ORAL_TABLET | Freq: Four times a day (QID) | ORAL | Status: DC
  Administered 2017-08-04 – 2017-08-07 (×12): 650 mg via ORAL
  Filled 2017-08-04 (×12): qty 2

## 2017-08-04 MED ORDER — ALBUTEROL SULFATE (2.5 MG/3ML) 0.083% IN NEBU: 3.0000 mL | INHALATION_SOLUTION | RESPIRATORY_TRACT | Status: DC | PRN

## 2017-08-04 MED ORDER — METOPROLOL TARTRATE 50 MG PO TABS
50.0000 mg | ORAL_TABLET | Freq: Every day | ORAL | Status: DC
  Administered 2017-08-04 – 2017-08-07 (×4): 50 mg via ORAL
  Filled 2017-08-04 (×4): qty 1

## 2017-08-04 NOTE — Progress Notes (Signed)
  Echocardiogram 2D Echocardiogram has been performed.  Jennette Dubin 08/04/2017, 10:37 AM

## 2017-08-04 NOTE — Plan of Care (Signed)
  Progressing Education: Knowledge of General Education information will improve 08/04/2017 2208 - Progressing by Talbert Forest, RN Health Behavior/Discharge Planning: Ability to manage health-related needs will improve 08/04/2017 2208 - Progressing by Talbert Forest, RN Clinical Measurements: Ability to maintain clinical measurements within normal limits will improve 08/04/2017 2208 - Progressing by Talbert Forest, RN Will remain free from infection 08/04/2017 2208 - Progressing by Talbert Forest, RN Diagnostic test results will improve 08/04/2017 2208 - Progressing by Talbert Forest, RN Respiratory complications will improve 08/04/2017 2208 - Progressing by Talbert Forest, RN Cardiovascular complication will be avoided 08/04/2017 2208 - Progressing by Talbert Forest, RN Activity: Risk for activity intolerance will decrease 08/04/2017 2208 - Progressing by Talbert Forest, RN Nutrition: Adequate nutrition will be maintained 08/04/2017 2208 - Progressing by Talbert Forest, RN Coping: Level of anxiety will decrease 08/04/2017 2208 - Progressing by Talbert Forest, RN Elimination: Will not experience complications related to urinary retention 08/04/2017 2208 - Progressing by Talbert Forest, RN Pain Managment: General experience of comfort will improve 08/04/2017 2208 - Progressing by Talbert Forest, RN Safety: Ability to remain free from injury will improve 08/04/2017 2208 - Progressing by Talbert Forest, RN Skin Integrity: Risk for impaired skin integrity will decrease 08/04/2017 2208 - Progressing by Talbert Forest, RN Education: Ability to demonstrate management of disease process will improve 08/04/2017 2208 - Progressing by Talbert Forest, RN Ability to verbalize understanding of medication therapies will improve 08/04/2017 2208 - Progressing by Talbert Forest,  RN Activity: Capacity to carry out activities will improve 08/04/2017 2208 - Progressing by Talbert Forest, RN Cardiac: Ability to achieve and maintain adequate cardiopulmonary perfusion will improve 08/04/2017 2208 - Progressing by Talbert Forest, RN

## 2017-08-04 NOTE — Care Management Note (Signed)
Case Management Note  Patient Details  Name: Michelle Wilcox MRN: [id number] Date of Birth: 04/02/1946  Subjective/Objective:                  Leg swelling, shortness of breath  Action/Plan: CM spoke with the patient at the bedside. Patient states she has a cane she uses occasionally. Reports she walks up the stairs sideways. She fell down her stairs approximately 5 years ago. States she has a bad knee (grabs her right knee). She experiences fatigue at times which she relates to anemia. Patients lives at home with her husband. She works 40 hours a week. Reports no issues related to affording her medications or getting to her physician appointments. She reports difficulty walking recently due to swelling in her legs, ankles and feet.   Expected Discharge Date:   unknown               Expected Discharge Plan:  Home/Self Care  In-House Referral:     Discharge planning Services  CM Consult  Post Acute Care Choice:    Choice offered to:     DME Arranged:    DME Agency:     HH Arranged:    HH Agency:     Status of Service:  In process, will continue to follow  If discussed at Long Length of Stay Meetings, dates discussed:    Additional Comments:  Apolonio Schneiders, RN 08/04/2017, 10:28 AM

## 2017-08-04 NOTE — Plan of Care (Signed)
  Progressing Education: Knowledge of General Education information will improve 08/04/2017 0232 - Progressing by Talbert Forest, RN Health Behavior/Discharge Planning: Ability to manage health-related needs will improve 08/04/2017 0232 - Progressing by Talbert Forest, RN Nutrition: Adequate nutrition will be maintained 08/04/2017 0232 - Progressing by Talbert Forest, RN Coping: Level of anxiety will decrease 08/04/2017 0232 - Progressing by Talbert Forest, RN Elimination: Will not experience complications related to urinary retention 08/04/2017 0232 - Progressing by Talbert Forest, RN Pain Managment: General experience of comfort will improve 08/04/2017 0232 - Progressing by Talbert Forest, RN Safety: Ability to remain free from injury will improve 08/04/2017 0232 - Progressing by Talbert Forest, RN Skin Integrity: Risk for impaired skin integrity will decrease 08/04/2017 0232 - Progressing by Talbert Forest, RN Education: Ability to verbalize understanding of medication therapies will improve 08/04/2017 0232 - Progressing by Talbert Forest, RN

## 2017-08-04 NOTE — Progress Notes (Signed)
LE venous duplex prelim: negative for DVT. Yona Stansbury Eunice, RDMS, RVT  

## 2017-08-04 NOTE — Progress Notes (Signed)
PROGRESS NOTE  Virgene Tirone  DUK:[id number] DOB: 02-06-1946 DOA: 08/03/2017 PCP: Cari Caraway, MD  Brief Narrative:   The patient is a 71 year old female with history of temporal arteritis, polymyalgia rheumatica, hemolytic anemia, gout, hypertension, who presented with lower extremity swelling, shortness of breath, and weakness.  She was found to have lower extremity swelling on exam.  Lower extremity duplex is negative.  No evidence of proteinuria on urinalysis.  Echocardiogram is pending.  She is being diuresed with Lasix and is starting to feel better.  She is also more anemic than her baseline.  She recently underwent treatment for influenza.  She is followed by Dr. Benay Spice from oncology and has been undergoing a prednisone taper.  Her case was discussed with Dr. Irene Limbo by phone on 12/16 who recommended starting prednisone burst at 60 mg daily.  Dr. Benay Spice should return on 12/17 to assist with further management.  No need for blood transfusion at this time.  The patient has many antibodies that make it difficult to find matched blood.  Assessment & Plan:  Shortness of breath and lower extremity edema, suggestive of new onset acute diastolic heart failure -Telemetry: Variable heart rate sinus rhythm, occasional sinus tachycardia -Urinalysis without proteinuria -TSH within normal limits -Lower extremity duplex: Negative for DVT -Echocardiogram: Pending -Start Lasix 40 mg IV twice daily -Daily weights and strict ins and outs -Repeat BMP and magnesium in a.m.  Hemolytic anemia ([place]), baseline hemoglobin of 12-13.  Initial hemoglobin 8.8g/dl.  LDH appears to be near her baseline and is mildly elevated, her reticulocyte count is higher than her baseline.  Worsening anemia may be secondary to her recent viral illness or due to her hemolytic anemia. -Last rituximab infusion was in May -Patient has been tapering her prednisone although she underwent a 5-day prednisone burst in the  last 2 weeks -Prednisone 60 mg once daily - folate, b12, iron studies -Appreciate hematology assistance    Hypokalemia, resolved with oral potassium supplementation -Start daily potassium while diuresing -Magnesium level was 1.7, will give magnesium sulfate 2 g IV once   Persistent cough may be secondary to her recent viral illness, alternatively this may reflect some acute diastolic heart failure vs. Asthma exacerbation -Cough syrup -Diuresing as above -Steroids as above    Hypertension, blood pressure stable -Continue metoprolol   DVT prophylaxis: Lovenox SQ. Code Status: Full code. Family Communication:  Spoke with patient alone, no family at bedside Disposition Plan:  Workup for her lower extremity edema and dyspnea, echocardiogram is pending.  Her hemoglobin is already starting to trend up and she may be able to follow-up as an outpatient with oncology.  Anticipate discharge to home in 2 days.  Consultants:   Oncology  Procedures:  None lower extremity duplex Echocardiogram  Antimicrobials:  Anti-infectives (From admission, onward)   None       Subjective:  Patient states that she voided very frequently overnight after she got her Lasix.  She feels that the swelling in her ankles has improved.  She still feels somewhat [place] of breath particularly with exertion.  She denies any calf pain or tenderness.  Objective: Vitals:   08/04/17 0043 08/04/17 0133 08/04/17 0529 08/04/17 1039  BP:  (!) 122/91 119/66 125/72  Pulse: 92 83 81 85  Resp: 18 18 16    Temp:  98.9 F (37.2 C) 98.4 F (36.9 C)   TempSrc:  Oral Oral   SpO2: 94% 96% 93%   Weight:  89.7 kg (197 lb 12 oz)  Height:  5\' 4"  (1.626 m)      Intake/Output Summary (Last 24 hours) at 08/04/2017 1253 Last data filed at 08/04/2017 1233 Gross per 24 hour  Intake 360 ml  Output 800 ml  Net -440 ml   Filed Weights   08/03/17 2317 08/04/17 0133  Weight: 88.9 kg (196 lb) 89.7 kg (197 lb 12 oz)     Examination:  General exam:  Adult female.  No acute distress.  HEENT:  NCAT, MMM Respiratory system: Rales at the bilateral bases with a faint wheeze, no rhonchi  cardiovascular system: Regular rate and rhythm, normal S1/S2. No murmurs, rubs, gallops or clicks.  Warm extremities Gastrointestinal system: Normal active bowel sounds, soft, nondistended, nontender. MSK:  Normal tone and bulk, 1+ pitting bilateral lower extremity edema, no palpable cords, no calf tenderness Neuro:  Grossly intact    Data Reviewed: I have personally reviewed following labs and imaging studies  CBC: Recent Labs  Lab 08/03/17 2159 08/04/17 0517  WBC 6.1 7.8  NEUTROABS  --  7.2  HGB 8.8* 9.5*  HCT 27.8* 29.9*  MCV 100.0 100.0  PLT 223 220   Basic Metabolic Panel: Recent Labs  Lab 08/03/17 2159 08/04/17 0517 08/04/17 0907  NA 141  --  140  K 3.1*  --  3.5  CL 106  --  103  CO2 28  --  26  GLUCOSE 87  --  255*  BUN 15  --  13  CREATININE 0.56  --  0.66  CALCIUM 8.4*  --  8.6*  MG  --  1.7  --    GFR: Estimated Creatinine Clearance: 68.9 mL/min (by C-G formula based on SCr of 0.66 mg/dL). Liver Function Tests: Recent Labs  Lab 08/04/17 0907  AST 38  ALT 16  ALKPHOS 71  BILITOT 1.2  PROT 6.5  ALBUMIN 4.1   No results for input(s): LIPASE, AMYLASE in the last 168 hours. No results for input(s): AMMONIA in the last 168 hours. Coagulation Profile: No results for input(s): INR, PROTIME in the last 168 hours. Cardiac Enzymes: No results for input(s): CKTOTAL, CKMB, CKMBINDEX, TROPONINI in the last 168 hours. BNP (last 3 results) No results for input(s): PROBNP in the last 8760 hours. HbA1C: No results for input(s): HGBA1C in the last 72 hours. CBG: Recent Labs  Lab 08/03/17 2106  GLUCAP 64*   Lipid Profile: No results for input(s): CHOL, HDL, LDLCALC, TRIG, CHOLHDL, LDLDIRECT in the last 72 hours. Thyroid Function Tests: Recent Labs    08/04/17 0907  TSH 0.661    Anemia Panel: Recent Labs    08/04/17 0517  RETICCTPCT 7.0*   Urine analysis:    Component Value Date/Time   COLORURINE YELLOW 08/03/2017 2321   APPEARANCEUR CLEAR 08/03/2017 2321   LABSPEC 1.020 08/03/2017 2321   LABSPEC 1.025 03/27/2016 0816   PHURINE 5.5 08/03/2017 2321   GLUCOSEU NEGATIVE 08/03/2017 2321   GLUCOSEU Negative 03/27/2016 0816   HGBUR NEGATIVE 08/03/2017 2321   BILIRUBINUR NEGATIVE 08/03/2017 2321   BILIRUBINUR Positive by Dipstix 03/27/2016 0816   KETONESUR TRACE (A) 08/03/2017 2321   PROTEINUR NEGATIVE 08/03/2017 2321   UROBILINOGEN 0.2 03/27/2016 0816   NITRITE NEGATIVE 08/03/2017 2321   LEUKOCYTESUR NEGATIVE 08/03/2017 2321   LEUKOCYTESUR Small 03/27/2016 0816   Sepsis Labs: @LABRCNTIP (procalcitonin:4,lacticidven:4)  )No results found for this or any previous visit (from the past 240 hour(s)).    Radiology Studies: Dg Chest 2 View  Result Date: 08/03/2017 CLINICAL DATA:  Dyspnea times 3-4  days with hypertension. History of hemolytic anemia. Nonsmoker. EXAM: CHEST  2 VIEW COMPARISON:  12/17/2016 FINDINGS: Stable cardiomegaly. Tortuous thoracic aorta with atherosclerotic calcifications at the arch. Minimal atelectasis at the right lung base. No effusion or pneumothorax. No pulmonary consolidations. Stable mild degenerative change along the dorsal spine and both shoulders. IMPRESSION: Stable cardiomegaly and aortic atherosclerosis. No active pulmonary disease. Electronically Signed   By: Ashley Royalty M.D.   On: 08/03/2017 18:38     Scheduled Meds: . acetaminophen  650 mg Oral Q6H  . cholecalciferol  2,000 Units Oral Daily  . enoxaparin (LOVENOX) injection  40 mg Subcutaneous Q24H  . [START ON 27/78/2423] folic acid  2 mg Oral Daily  . furosemide  40 mg Intravenous BID  . metoprolol tartrate  25 mg Oral QHS  . metoprolol tartrate  50 mg Oral Daily  . pantoprazole  40 mg Oral Daily  . [START ON 08/05/2017] predniSONE  60 mg Oral Q breakfast    Continuous Infusions:   LOS: 0 days    Time spent: 30 min    Janece Canterbury, MD [facility] Pager [phone]  If 7PM-7AM, please contact night-coverage [email or web address] Password [facility] 08/04/2017, 12:53 PM

## 2017-08-04 NOTE — Care Management Obs Status (Signed)
[place] NOTIFICATION   Patient Details  Name: Michelle Wilcox MRN: [id number] Date of Birth: 12-23-1945   Medicare Observation Status Notification Given:  Yes    Apolonio Schneiders, RN 08/04/2017, 9:50 AM

## 2017-08-05 ENCOUNTER — Telehealth: Payer: Self-pay

## 2017-08-05 ENCOUNTER — Ambulatory Visit: Payer: Self-pay | Admitting: Nurse Practitioner

## 2017-08-05 ENCOUNTER — Other Ambulatory Visit: Payer: Medicare Other

## 2017-08-05 ENCOUNTER — Telehealth: Payer: Self-pay | Admitting: Nurse Practitioner

## 2017-08-05 DIAGNOSIS — Z880 Allergy status to penicillin: Secondary | ICD-10-CM | POA: Diagnosis not present

## 2017-08-05 DIAGNOSIS — E785 Hyperlipidemia, unspecified: Secondary | ICD-10-CM | POA: Diagnosis present

## 2017-08-05 DIAGNOSIS — Z888 Allergy status to other drugs, medicaments and biological substances status: Secondary | ICD-10-CM | POA: Diagnosis not present

## 2017-08-05 DIAGNOSIS — R161 Splenomegaly, not elsewhere classified: Secondary | ICD-10-CM | POA: Diagnosis present

## 2017-08-05 DIAGNOSIS — D599 Acquired hemolytic anemia, unspecified: Secondary | ICD-10-CM | POA: Diagnosis not present

## 2017-08-05 DIAGNOSIS — E876 Hypokalemia: Secondary | ICD-10-CM

## 2017-08-05 DIAGNOSIS — F419 Anxiety disorder, unspecified: Secondary | ICD-10-CM | POA: Diagnosis present

## 2017-08-05 DIAGNOSIS — I5031 Acute diastolic (congestive) heart failure: Secondary | ICD-10-CM

## 2017-08-05 DIAGNOSIS — Z882 Allergy status to sulfonamides status: Secondary | ICD-10-CM | POA: Diagnosis not present

## 2017-08-05 DIAGNOSIS — I10 Essential (primary) hypertension: Secondary | ICD-10-CM | POA: Diagnosis not present

## 2017-08-05 DIAGNOSIS — E8770 Fluid overload, unspecified: Secondary | ICD-10-CM | POA: Diagnosis not present

## 2017-08-05 DIAGNOSIS — F329 Major depressive disorder, single episode, unspecified: Secondary | ICD-10-CM | POA: Diagnosis present

## 2017-08-05 DIAGNOSIS — Z7952 Long term (current) use of systemic steroids: Secondary | ICD-10-CM | POA: Diagnosis not present

## 2017-08-05 DIAGNOSIS — D591 Other autoimmune hemolytic anemias: Secondary | ICD-10-CM

## 2017-08-05 DIAGNOSIS — R197 Diarrhea, unspecified: Secondary | ICD-10-CM | POA: Diagnosis not present

## 2017-08-05 DIAGNOSIS — Z79899 Other long term (current) drug therapy: Secondary | ICD-10-CM | POA: Diagnosis not present

## 2017-08-05 DIAGNOSIS — R600 Localized edema: Secondary | ICD-10-CM | POA: Diagnosis not present

## 2017-08-05 DIAGNOSIS — T502X5A Adverse effect of carbonic-anhydrase inhibitors, benzothiadiazides and other diuretics, initial encounter: Secondary | ICD-10-CM | POA: Diagnosis not present

## 2017-08-05 DIAGNOSIS — I110 Hypertensive heart disease with heart failure: Secondary | ICD-10-CM | POA: Diagnosis present

## 2017-08-05 DIAGNOSIS — M353 Polymyalgia rheumatica: Secondary | ICD-10-CM | POA: Diagnosis present

## 2017-08-05 LAB — BASIC METABOLIC PANEL
ANION GAP: 5 (ref 5–15)
BUN: 14 mg/dL (ref 6–20)
CHLORIDE: 104 mmol/L (ref 101–111)
CO2: 31 mmol/L (ref 22–32)
Calcium: 8.4 mg/dL — ABNORMAL LOW (ref 8.9–10.3)
Creatinine, Ser: 0.62 mg/dL (ref 0.44–1.00)
Glucose, Bld: 99 mg/dL (ref 65–99)
Potassium: 3.4 mmol/L — ABNORMAL LOW (ref 3.5–5.1)
SODIUM: 140 mmol/L (ref 135–145)

## 2017-08-05 LAB — CBC WITH DIFFERENTIAL/PLATELET
BASOS ABS: 0 10*3/uL (ref 0.0–0.1)
Basophils Relative: 0 %
EOS PCT: 1 %
Eosinophils Absolute: 0 10*3/uL (ref 0.0–0.7)
HCT: 28.2 % — ABNORMAL LOW (ref 36.0–46.0)
HEMOGLOBIN: 8.7 g/dL — AB (ref 12.0–15.0)
LYMPHS PCT: 18 %
Lymphs Abs: 1.4 10*3/uL (ref 0.7–4.0)
MCH: 31.3 pg (ref 26.0–34.0)
MCHC: 30.9 g/dL (ref 30.0–36.0)
MCV: 101.4 fL — AB (ref 78.0–100.0)
Monocytes Absolute: 0.3 10*3/uL (ref 0.1–1.0)
Monocytes Relative: 4 %
NEUTROS PCT: 77 %
Neutro Abs: 5.7 10*3/uL (ref 1.7–7.7)
PLATELETS: 236 10*3/uL (ref 150–400)
RBC: 2.78 MIL/uL — AB (ref 3.87–5.11)
RDW: 16 % — ABNORMAL HIGH (ref 11.5–15.5)
WBC: 7.4 10*3/uL (ref 4.0–10.5)

## 2017-08-05 LAB — TYPE AND SCREEN
ABO/RH(D): O POS
Antibody Screen: POSITIVE
DAT, IGG: POSITIVE

## 2017-08-05 LAB — MAGNESIUM: MAGNESIUM: 2.4 mg/dL (ref 1.7–2.4)

## 2017-08-05 LAB — HAPTOGLOBIN

## 2017-08-05 MED ORDER — FUROSEMIDE 40 MG PO TABS
40.0000 mg | ORAL_TABLET | Freq: Every day | ORAL | Status: DC
  Administered 2017-08-06 – 2017-08-07 (×2): 40 mg via ORAL
  Filled 2017-08-05 (×2): qty 1

## 2017-08-05 NOTE — Progress Notes (Signed)
IP PROGRESS NOTE  Subjective:   She was admitted 08/03/2017 with lower extremity edema and "weakness ".  She reports the leg edema has improved with Lasix.  She has not tried ambulating outside of the hospital room. Michelle Wilcox had an upper respiratory infection several weeks ago and was treated with Tamiflu.  She reports a positive influenza swab.  Fever resolved after 1 day on Tamiflu.  She continues to have a cough.  She missed a planned lab appointment last week secondary to the snow event.  Objective: Vital signs in last 24 hours: Blood pressure 111/71, pulse 92, temperature 98 F (36.7 C), temperature source Axillary, resp. rate 17, height 5\' 4"  (1.626 m), weight 194 lb 10.7 oz (88.3 kg), SpO2 92 %.  Intake/Output from previous day: 12/16 0701 - 12/17 0700 In: 290 [P.O.:240; IV Piggyback:50] Out: 1600 [Urine:1600]  Physical Exam: Lungs: End inspiratory rales at the posterior base bilaterally, no respiratory distress Cardiac: Regular rate and rhythm Abdomen: No hepatosplenomegaly Extremities: No leg edema   Lab Results: Recent Labs    08/04/17 0517 08/05/17 0453  WBC 7.8 7.4  HGB 9.5* 8.7*  HCT 29.9* 28.2*  PLT 243 236  08/04/2017- LDH 269, reticulocyte count 207.9  BMET Recent Labs    08/04/17 0907 08/05/17 0453  NA 140 140  K 3.5 3.4*  CL 103 104  CO2 26 31  GLUCOSE 255* 99  BUN 13 14  CREATININE 0.66 0.62  CALCIUM 8.6* 8.4*    No results found for: CEA1  Studies/Results: Dg Chest 2 View  Result Date: 08/03/2017 CLINICAL DATA:  Dyspnea times 3-4 days with hypertension. History of hemolytic anemia. Nonsmoker. EXAM: CHEST  2 VIEW COMPARISON:  12/17/2016 FINDINGS: Stable cardiomegaly. Tortuous thoracic aorta with atherosclerotic calcifications at the arch. Minimal atelectasis at the right lung base. No effusion or pneumothorax. No pulmonary consolidations. Stable mild degenerative change along the dorsal spine and both shoulders. IMPRESSION: Stable  cardiomegaly and aortic atherosclerosis. No active pulmonary disease. Electronically Signed   By: Ashley Royalty M.D.   On: 08/03/2017 18:38    Medications: I have reviewed the patient's current medications.  Assessment/Plan:  1. 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5 mg daily 01/28/2017  Prednisone taper to 10 mg daily 02/19/2017  Prednisone taper to 7.5 mg daily 04/01/2017  Prednisone taper to 5 mg daily 05/20/2017  2. History ofExertional dyspnea secondary to #1  3. Mild splenomegaly on CT 03/16/2016  4. History of polymyalgia rheumatica  5. History of giant cell/temporal arteritis-maintained on methotrexate in the remote past, now on low dose prednisone  6.   Admission with leg edema 08/03/2017-anemia related?,  Improved  Michelle Wilcox has progressive anemia.  She appears to have recurrent hemolytic anemia.  This may be related to the recent upper respiratory infection or idiopathic immune hemolytic anemia.  I recommend continuing prednisone at a dose of 60 mg daily with  close outpatient follow-up of the hemoglobin.  We will consider a splenectomy if the hemolysis persists while on a prednisone taper.  The leg edema may be related to progressive anemia.  The edema appears improved at present.  Recommendations: 1.   Continue prednisone at a dose of 60 mg daily 2.  Continue folic acid 3.  [facility]patient [facility] will [facility] for 08/12/2017    LOS: 0 days   Michelle Coder, MD   08/05/2017, 7:43 AM

## 2017-08-05 NOTE — Telephone Encounter (Signed)
Scheduled appt per 12/17 sch message - Lattie Haw not here on 12/24 - scheduled next available on 12/26 - left message with appt date and time and sent reminder letter in the mail.

## 2017-08-05 NOTE — Telephone Encounter (Signed)
Pt did not show for 10:15 appointment.  A call was made and a message was left for patient to call and reschedule appointment.

## 2017-08-05 NOTE — Progress Notes (Signed)
PROGRESS NOTE  Michelle Wilcox  NUU:[id number] DOB: April 20, 1946 DOA: 08/03/2017 PCP: Cari Caraway, MD  Brief Narrative:   The patient is a 71 year old female with history of temporal arteritis, polymyalgia rheumatica, hemolytic anemia, gout, hypertension, who presented with lower extremity swelling, shortness of breath, and weakness.  She was found to have lower extremity swelling on exam.  Lower extremity duplex is negative.  No evidence of proteinuria on urinalysis.  Echocardiogram is pending.  She is being diuresed with Lasix and is starting to feel better.  She is also more anemic than her baseline.  She recently underwent treatment for influenza.  She is followed by Dr. Benay Spice from oncology and has been undergoing a prednisone taper.  Her case was discussed with Dr. Irene Limbo by phone on 12/16 who recommended starting prednisone burst at 60 mg daily.  Appreciate Dr. Gearldine Shown assistance.    Assessment & Plan:  Shortness of breath and lower extremity edema due to new onset acute diastolic heart failure -Telemetry: NSR, okay to d/c telemetry -Urinalysis without proteinuria -TSH within normal limits -Lower extremity duplex: Negative for DVT -Echocardiogram: Ejection fraction 60-65% with evidence of grade 1 diastolic dysfunction, mildly dilated left atrium, mild pulmonary hypertension -Decrease to Lasix once daily and plan to discharge on Lasix 40 mg p.o. daily until she follows up with her PCP  --1.3 L -Repeat BMP in am   Hemolytic anemia ([place]), baseline hemoglobin of 12-13.  Initial hemoglobin 8.8g/dl.  Hemoglobin continues to trend down -Last rituximab infusion was in May -Continue prednisone 60 mg once daily - folate within normal limits, b12 635, iron studies consistent with inflammation -Dr. Benay Spice requests that we repeat her CBC in the morning and if her hemoglobin is approximately stable, she can be discharged to follow-up with him as an outpatient -Appreciate  hematology assistance -  Occult stool positive:  Recommend outpatient GI consultation    Hypokalemia, recurred due to diuresis.  Continue oral potassium supplementation -Magnesium level was 1.7, gave magnesium sulfate 2 g IV once   Persistent cough may be secondary to her recent viral illness, alternatively this may reflect some acute diastolic heart failure vs. Asthma exacerbation -Cough syrup -Diuresing as above -Steroids as above    Hypertension, blood pressure stable -Continue metoprolol  Diarrhea, present since she had the flu.  Improving with imodium.   -  F/u with GI  DVT prophylaxis: Lovenox SQ. Code Status: Full code. Family Communication:  Spoke with patient alone, no family at bedside Disposition Plan:  Likely home tomorrow if her creatinine and hemoglobin are stable  Consultants:   Oncology  Procedures:  None lower extremity duplex Echocardiogram  Antimicrobials:  Anti-infectives (From admission, onward)   None       Subjective:  Patient states she feels much better.  She is breathing more easily, her cough is improved some, and her swelling has gone down dramatically in her bilateral feet.  She still has a little bit of residual ankle swelling.  She was disappointed that her hemoglobin had gone down this morning.  Still having diarrhea.  Asking for more imodium.    Objective: Vitals:   08/04/17 1949 08/05/17 0453 08/05/17 0809 08/05/17 0811  BP: (!) 127/53 111/71 (!) 134/57   Pulse: 92 92  88  Resp: 18 17    Temp: 98.2 F (36.8 C) 98 F (36.7 C)    TempSrc: Oral Axillary    SpO2: 93% 92%    Weight:  88.3 kg (194 lb 10.7 oz)  Height:        Intake/Output Summary (Last 24 hours) at 08/05/2017 1123 Last data filed at 08/05/2017 1022 Gross per 24 hour  Intake 530 ml  Output 1600 ml  Net -1070 ml   Filed Weights   08/03/17 2317 08/04/17 0133 08/05/17 0453  Weight: 88.9 kg (196 lb) 89.7 kg (197 lb 12 oz) 88.3 kg (194 lb 10.7 oz)     Examination:  General exam:  Adult female.  No acute distress.  HEENT:  NCAT, MMM Respiratory system: Clear to auscultation bilaterally Cardiovascular system: Regular rate and rhythm, normal S1/S2. No murmurs, rubs, gallops or clicks.  Warm extremities Gastrointestinal system: Normal active bowel sounds, soft, nondistended, nontender. MSK:  Normal tone and bulk, trace pitting bilateral ankle edema Neuro:  Grossly intact  Data Reviewed: I have personally reviewed following labs and imaging studies  CBC: Recent Labs  Lab 08/03/17 2159 08/04/17 0517 08/05/17 0453  WBC 6.1 7.8 7.4  NEUTROABS  --  7.2 5.7  HGB 8.8* 9.5* 8.7*  HCT 27.8* 29.9* 28.2*  MCV 100.0 100.0 101.4*  PLT 223 243 034   Basic Metabolic Panel: Recent Labs  Lab 08/03/17 2159 08/04/17 0517 08/04/17 0907 08/05/17 0453  NA 141  --  140 140  K 3.1*  --  3.5 3.4*  CL 106  --  103 104  CO2 28  --  26 31  GLUCOSE 87  --  255* 99  BUN 15  --  13 14  CREATININE 0.56  --  0.66 0.62  CALCIUM 8.4*  --  8.6* 8.4*  MG  --  1.7  --  2.4   GFR: Estimated Creatinine Clearance: 68.3 mL/min (by C-G formula based on SCr of 0.62 mg/dL). Liver Function Tests: Recent Labs  Lab 08/04/17 0907  AST 38  ALT 16  ALKPHOS 71  BILITOT 1.2  PROT 6.5  ALBUMIN 4.1   No results for input(s): LIPASE, AMYLASE in the last 168 hours. No results for input(s): AMMONIA in the last 168 hours. Coagulation Profile: No results for input(s): INR, PROTIME in the last 168 hours. Cardiac Enzymes: No results for input(s): CKTOTAL, CKMB, CKMBINDEX, TROPONINI in the last 168 hours. BNP (last 3 results) No results for input(s): PROBNP in the last 8760 hours. HbA1C: No results for input(s): HGBA1C in the last 72 hours. CBG: Recent Labs  Lab 08/03/17 2106  GLUCAP 64*   Lipid Profile: No results for input(s): CHOL, HDL, LDLCALC, TRIG, CHOLHDL, LDLDIRECT in the last 72 hours. Thyroid Function Tests: Recent Labs    08/04/17 0907   TSH 0.661   Anemia Panel: Recent Labs    08/04/17 0517 08/04/17 0907  VITAMINB12  --  635  FOLATE  --  52.0  FERRITIN  --  558*  TIBC  --  224*  IRON  --  47  RETICCTPCT 7.0*  --    Urine analysis:    Component Value Date/Time   COLORURINE YELLOW 08/03/2017 [place] 08/03/2017 2321   LABSPEC 1.020 08/03/2017 2321   LABSPEC 1.025 03/27/2016 0816   PHURINE 5.5 08/03/2017 2321   GLUCOSEU NEGATIVE 08/03/2017 2321   GLUCOSEU Negative 03/27/2016 0816   HGBUR NEGATIVE 08/03/2017 2321   BILIRUBINUR NEGATIVE 08/03/2017 2321   BILIRUBINUR Positive by Dipstix 03/27/2016 0816   KETONESUR TRACE (A) 08/03/2017 2321   PROTEINUR NEGATIVE 08/03/2017 2321   UROBILINOGEN 0.2 03/27/2016 0816   NITRITE NEGATIVE 08/03/2017 2321   LEUKOCYTESUR NEGATIVE 08/03/2017 2321   LEUKOCYTESUR  Small 03/27/2016 0816   Sepsis Labs: @LABRCNTIP (procalcitonin:4,lacticidven:4)  )No results found for this or any previous visit (from the past 240 hour(s)).    Radiology Studies: Dg Chest 2 View  Result Date: 08/03/2017 CLINICAL DATA:  Dyspnea times 3-4 days with hypertension. History of hemolytic anemia. Nonsmoker. EXAM: CHEST  2 VIEW COMPARISON:  12/17/2016 FINDINGS: Stable cardiomegaly. Tortuous thoracic aorta with atherosclerotic calcifications at the arch. Minimal atelectasis at the right lung base. No effusion or pneumothorax. No pulmonary consolidations. Stable mild degenerative change along the dorsal spine and both shoulders. IMPRESSION: Stable cardiomegaly and aortic atherosclerosis. No active pulmonary disease. Electronically Signed   By: Ashley Royalty M.D.   On: 08/03/2017 18:38     Scheduled Meds: . acetaminophen  650 mg Oral Q6H  . cholecalciferol  2,000 Units Oral Daily  . enoxaparin (LOVENOX) injection  40 mg Subcutaneous Q24H  . folic acid  2 mg Oral Daily  . furosemide  40 mg Oral Daily  . metoprolol tartrate  25 mg Oral QHS  . metoprolol tartrate  50 mg Oral Daily  .  pantoprazole  40 mg Oral Daily  . potassium chloride  40 mEq Oral Daily  . predniSONE  60 mg Oral Q breakfast   Continuous Infusions:   LOS: 0 days    Time spent: 30 min    Janece Canterbury, MD [facility] Pager [phone]  If 7PM-7AM, please contact night-coverage [email or web address] Password [email or web address] 08/05/2017, 11:23 AM

## 2017-08-05 NOTE — Evaluation (Signed)
Physical Therapy One Time Evaluation Patient Details Name: Michelle Wilcox MRN: [id number] DOB: Jun 02, 1946 Today's Date: 08/05/2017   History of Present Illness  71 year old female with history of temporal arteritis, polymyalgia rheumatica, hemolytic anemia, gout, hypertension and admitted for new onset acute diastolic heart failure  Clinical Impression  Patient evaluated by Physical Therapy with no further acute PT needs identified. All education has been completed and the patient has no further questions.  Pt ambulated in hallway with no obvious gait deviations and denies any symptoms.  Pt eager to return to work later this week.  Recommended pt take frequent rest breaks (as needed) upon d/c due to recent hx of flu as well as this admission. See below for any follow-up Physical Therapy or equipment needs. PT is signing off. Thank you for this referral.     Follow Up Recommendations No PT follow up    Equipment Recommendations  None recommended by PT    Recommendations for Other Services       Precautions / Restrictions Precautions Precautions: None      Mobility  Bed Mobility Overal bed mobility: Modified Independent                Transfers Overall transfer level: Modified independent                  Ambulation/Gait Ambulation/Gait assistance: Modified independent (Device/Increase time);Supervision Ambulation Distance (Feet): 280 Feet Assistive device: None Gait Pattern/deviations: WFL(Within Functional Limits)     General Gait Details: no symptoms reported, HR remained in 80s throughout session  Stairs            Wheelchair Mobility    Modified Rankin (Stroke Patients Only)       Balance Overall balance assessment: Modified Independent(no hx of falls)                                           Pertinent Vitals/Pain Pain Assessment: No/denies pain    Home Living Family/patient expects to be discharged to::  Private residence Living Arrangements: Spouse/significant other   Type of Home: House       Home Layout: Able to live on main level with bedroom/bathroom Home Equipment: None      Prior Function Level of Independence: Independent         Comments: typically performs steps sideways one at a time due to R knee issues (hx of arthroscopy, states likely needs TKA soon); works at [employer]        Extremity/Trunk Assessment        Lower Extremity Assessment Lower Extremity Assessment: Overall WFL for tasks assessed    Cervical / Trunk Assessment Cervical / Trunk Assessment: Normal  Communication   Communication: No difficulties  Cognition Arousal/Alertness: Awake/alert Behavior During Therapy: WFL for tasks assessed/performed Overall Cognitive Status: Within Functional Limits for tasks assessed                                        General Comments      Exercises     Assessment/Plan    PT Assessment Patent does not need any further PT services  PT Problem List         PT Treatment Interventions      PT Goals (  Current goals can be found in the Care Plan section)  Acute Rehab PT Goals PT Goal Formulation: All assessment and education complete, DC therapy    Frequency     Barriers to discharge        Co-evaluation               AM-PAC PT "6 Clicks" Daily Activity  Outcome Measure Difficulty turning over in bed (including adjusting bedclothes, sheets and blankets)?: None Difficulty moving from lying on back to sitting on the side of the bed? : None Difficulty sitting down on and standing up from a chair with arms (e.g., wheelchair, bedside commode, etc,.)?: None Help needed moving to and from a bed to chair (including a wheelchair)?: None Help needed walking in hospital room?: A Little Help needed climbing 3-5 steps with a railing? : A Little 6 Click Score: 22    End of Session   Activity Tolerance:  Patient tolerated treatment well Patient left: in bed;with call bell/phone within reach   PT Visit Diagnosis: Difficulty in walking, not elsewhere classified (R26.2)    Time: 7408-1448 PT Time Calculation (min) (ACUTE ONLY): 12 min   Charges:   PT Evaluation $PT Eval Low Complexity: 1 Low     PT G Codes:   PT G-Codes **NOT FOR INPATIENT CLASS** Functional Assessment Tool Used: Clinical judgement;AM-PAC 6 Clicks Basic Mobility Functional Limitation: Mobility: Walking and moving around Mobility: Walking and Moving Around Current Status (J8563): At least 1 percent but less than 20 percent impaired, limited or restricted Mobility: Walking and Moving Around Goal Status [phone]): At least 1 percent but less than 20 percent impaired, limited or restricted Mobility: Walking and Moving Around Discharge Status [phone]): At least 1 percent but less than 20 percent impaired, limited or restricted  Carmelia Bake, PT, DPT 08/05/2017 Pager: [phone]   York Ram E 08/05/2017, 12:03 PM

## 2017-08-05 NOTE — Plan of Care (Signed)
  Progressing Education: Knowledge of General Education information will improve 08/05/2017 2140 - Progressing by Talbert Forest, RN Health Behavior/Discharge Planning: Ability to manage health-related needs will improve 08/05/2017 2140 - Progressing by Talbert Forest, RN Clinical Measurements: Ability to maintain clinical measurements within normal limits will improve 08/05/2017 2140 - Progressing by Talbert Forest, RN Will remain free from infection 08/05/2017 2140 - Progressing by Talbert Forest, RN Diagnostic test results will improve 08/05/2017 2140 - Progressing by Talbert Forest, RN Respiratory complications will improve 08/05/2017 2140 - Progressing by Talbert Forest, RN Cardiovascular complication will be avoided 08/05/2017 2140 - Progressing by Talbert Forest, RN Activity: Risk for activity intolerance will decrease 08/05/2017 2140 - Progressing by Talbert Forest, RN Nutrition: Adequate nutrition will be maintained 08/05/2017 2140 - Progressing by Talbert Forest, RN Coping: Level of anxiety will decrease 08/05/2017 2140 - Progressing by Talbert Forest, RN Elimination: Will not experience complications related to bowel motility 08/05/2017 2140 - Progressing by Talbert Forest, RN Will not experience complications related to urinary retention 08/05/2017 2140 - Progressing by Talbert Forest, RN Pain Managment: General experience of comfort will improve 08/05/2017 2140 - Progressing by Talbert Forest, RN Skin Integrity: Risk for impaired skin integrity will decrease 08/05/2017 2140 - Progressing by Talbert Forest, RN Education: Ability to demonstrate management of disease process will improve 08/05/2017 2140 - Progressing by Talbert Forest, RN Ability to verbalize understanding of medication therapies will improve 08/05/2017 2140 - Progressing by Talbert Forest,  RN Activity: Capacity to carry out activities will improve 08/05/2017 2140 - Progressing by Talbert Forest, RN Cardiac: Ability to achieve and maintain adequate cardiopulmonary perfusion will improve 08/05/2017 2140 - Progressing by Talbert Forest, RN

## 2017-08-05 NOTE — Progress Notes (Signed)
Nutrition Brief Note  Patient identified on the Malnutrition Screening Tool (MST) Report  Wt Readings from Last 15 Encounters:  08/05/17 194 lb 10.7 oz (88.3 kg)  07/22/17 196 lb (88.9 kg)  05/20/17 199 lb (90.3 kg)  04/01/17 199 lb 3.2 oz (90.4 kg)  01/28/17 202 lb 11.2 oz (91.9 kg)  12/28/16 201 lb 12.8 oz (91.5 kg)  12/17/16 201 lb (91.2 kg)  11/26/16 201 lb 9.6 oz (91.4 kg)  11/05/16 199 lb 9.6 oz (90.5 kg)  10/08/16 198 lb 12.8 oz (90.2 kg)  08/27/16 197 lb 6 oz (89.5 kg)  07/02/16 188 lb 14.4 oz (85.7 kg)  06/04/16 191 lb 14.4 oz (87 kg)  04/30/16 185 lb 4.8 oz (84.1 kg)  04/02/16 184 lb 11.2 oz (83.8 kg)    Body mass index is 33.41 kg/m. Patient meets criteria for obesity based on current BMI. Current weight -5 lbs (2.5% body weight) in the past 4 months which is not significant for time frame. Skin WDL. Pt with hx of temporal arteritis, polymyalgia rheumatica, hemolytic anemia, gout, and HTN. She was admitted for BLE swelling, SOB, and associated weakness. Pt was started on Lasix. She reports decreased appetite for a few days PTA d/t having the flu. Per Dr. Rise Mu note this AM, pt likely to d/c home tomorrow if creatinine and hemoglobin are stable at that time.   Current diet order is Heart Healthy, patient ate 75% of breakfast this AM (580 kcal, 10 grams of protein).  Medications reviewed; 2000 units vitamin D/day, 2 mg oral folic acid/day, 40 mg IV Lasix/day, 2 g IV Mg sulfate x1 run yesterday, 40 mg oral Protonix/day, 40 mEq oral KCl/day, 60 mg Deltasone/day.  Labs reviewed; K: 3.4 mmol/L, Ca: 8.4 mg/dL, creatinine: 0.62 mg/dL, Hgb: 8.7 g/dL.  No nutrition interventions warranted at this time. If nutrition issues arise, please consult RD.      Jarome Matin, MS, RD, LDN, [facility] Inpatient Clinical Dietitian Pager # [phone] After hours/weekend pager # [phone]

## 2017-08-06 ENCOUNTER — Other Ambulatory Visit: Payer: Self-pay | Admitting: *Deleted

## 2017-08-06 DIAGNOSIS — R197 Diarrhea, unspecified: Secondary | ICD-10-CM

## 2017-08-06 LAB — CBC WITH DIFFERENTIAL/PLATELET
BASOS ABS: 0 10*3/uL (ref 0.0–0.1)
Basophils Relative: 0 %
Eosinophils Absolute: 0.1 10*3/uL (ref 0.0–0.7)
Eosinophils Relative: 1 %
HEMATOCRIT: 29.3 % — AB (ref 36.0–46.0)
Hemoglobin: 9.1 g/dL — ABNORMAL LOW (ref 12.0–15.0)
LYMPHS PCT: 19 %
Lymphs Abs: 1.6 10*3/uL (ref 0.7–4.0)
MCH: 31.5 pg (ref 26.0–34.0)
MCHC: 31.1 g/dL (ref 30.0–36.0)
MCV: 101.4 fL — AB (ref 78.0–100.0)
MONO ABS: 0.4 10*3/uL (ref 0.1–1.0)
Monocytes Relative: 5 %
NEUTROS ABS: 6.4 10*3/uL (ref 1.7–7.7)
Neutrophils Relative %: 75 %
Platelets: 237 10*3/uL (ref 150–400)
RBC: 2.89 MIL/uL — AB (ref 3.87–5.11)
RDW: 15.7 % — ABNORMAL HIGH (ref 11.5–15.5)
WBC: 8.5 10*3/uL (ref 4.0–10.5)

## 2017-08-06 LAB — GASTROINTESTINAL PANEL BY PCR, STOOL (REPLACES STOOL CULTURE)

## 2017-08-06 LAB — MAGNESIUM: MAGNESIUM: 2.2 mg/dL (ref 1.7–2.4)

## 2017-08-06 LAB — BASIC METABOLIC PANEL
ANION GAP: 7 (ref 5–15)
BUN: 13 mg/dL (ref 6–20)
CHLORIDE: 103 mmol/L (ref 101–111)
CO2: 31 mmol/L (ref 22–32)
Calcium: 8.6 mg/dL — ABNORMAL LOW (ref 8.9–10.3)
Creatinine, Ser: 0.67 mg/dL (ref 0.44–1.00)
GFR calc Af Amer: >60 mL/min (ref >60)
GLUCOSE: 100 mg/dL — AB (ref 65–99)
POTASSIUM: 3.9 mmol/L (ref 3.5–5.1)
Sodium: 141 mmol/L (ref 135–145)

## 2017-08-06 NOTE — Progress Notes (Signed)
PROGRESS NOTE  Michelle Wilcox  XFG:[id number] DOB: 03-01-1946 DOA: 08/03/2017 PCP: Cari Caraway, MD  Brief Narrative:   The patient is a 71 year old female with history of temporal arteritis, polymyalgia rheumatica, hemolytic anemia, gout, hypertension, who presented with lower extremity swelling, shortness of breath, and weakness.  She was found to have lower extremity swelling on exam.  Lower extremity duplex is negative.  No evidence of proteinuria on urinalysis.  Echocardiogram is pending.  She is being diuresed with Lasix and is starting to feel better.  She is also more anemic than her baseline.  She recently underwent treatment for influenza.  She is followed by Dr. Benay Spice from oncology and has been undergoing a prednisone taper.  Her case was discussed with Dr. Irene Limbo by phone on 12/16 who recommended starting prednisone burst at 60 mg daily.  Appreciate Dr. Gearldine Shown assistance.    Assessment & Plan:  Shortness of breath and lower extremity edema due to new onset acute diastolic heart failure -Urinalysis without proteinuria -TSH within normal limits -Lower extremity duplex: Negative for DVT -Echocardiogram: Ejection fraction 60-65% with evidence of grade 1 diastolic dysfunction, mildly dilated left atrium, mild pulmonary hypertension - continue Lasix 40 mg p.o. daily until she follows up with her PCP  -Repeat BMP in am   Diarrhea, present since she had the flu.  Requiring frequent imodium and not improving.  Occult stool positive -  GI pathogen panel and C. Diff PCR -  F/u with GI  Hemolytic anemia ([place]), baseline hemoglobin of 12-13.  Initial hemoglobin 8.8g/dl.  Hemoglobin approximately stable near 8-9g/dl -Last rituximab infusion was in May -Continue prednisone 60 mg once daily - folate within normal limits, b12 635, iron studies consistent with inflammation -Dr. Benay Spice to follow up on Monday as outpatient    Hypokalemia, recurred due to diuresis.  Continue  oral potassium supplementation -Magnesium level was 1.7, gave magnesium sulfate 2 g IV once   Persistent cough may be secondary to her recent viral illness, alternatively this may reflect some acute diastolic heart failure vs. Asthma exacerbation, improving -Cough syrup -Diuresing as above -Steroids as above    Hypertension, blood pressure stable -Continue metoprolol  DVT prophylaxis: Lovenox SQ. Code Status: Full code. Family Communication:  Spoke with patient alone, no family at bedside Disposition Plan:  Likely home tomorrow if diarrhea slowing  Consultants:   Oncology  Procedures:  None lower extremity duplex Echocardiogram  Antimicrobials:  Anti-infectives (From admission, onward)   None       Subjective:  Patient states she feels terrible today.  Her legs are cramping.  She feels very weak and is unsure she can even walk to the bathroom.  She feels like she might be getting a fever.  She continues to have a cough but her cough is improved and her swelling has gone down.     Objective: Vitals:   08/05/17 0811 08/05/17 1405 08/05/17 2106 08/06/17 0432  BP:  126/72 139/66 121/76  Pulse: 88 79 84 69  Resp:  18 16 12   Temp:  98.2 F (36.8 C) 98.3 F (36.8 C) 98.2 F (36.8 C)  TempSrc:  Oral Oral Oral  SpO2:  98% 97% 96%  Weight:    87.1 kg (192 lb 0.3 oz)  Height:        Intake/Output Summary (Last 24 hours) at 08/06/2017 1624 Last data filed at 08/06/2017 1022 Gross per 24 hour  Intake 120 ml  Output 1700 ml  Net -1580 ml  Filed Weights   08/04/17 0133 08/05/17 0453 08/06/17 0432  Weight: 89.7 kg (197 lb 12 oz) 88.3 kg (194 lb 10.7 oz) 87.1 kg (192 lb 0.3 oz)    Examination:  General exam:  Adult female.  No acute distress.  HEENT:  NCAT, MMM Respiratory system: Clear to auscultation bilaterally Cardiovascular system: Regular rate and rhythm, normal S1/S2. No murmurs, rubs, gallops or clicks.  Warm extremities Gastrointestinal system: Normal  active bowel sounds, soft, nondistended, nontender. MSK:  Normal tone and bulk, trace pitting bilateral lower extremity edema, no palpable cords Neuro:  Grossly moves all extremities   Data Reviewed: I have personally reviewed following labs and imaging studies  CBC: Recent Labs  Lab 08/03/17 2159 08/04/17 0517 08/05/17 0453 08/06/17 0451  WBC 6.1 7.8 7.4 8.5  NEUTROABS  --  7.2 5.7 6.4  HGB 8.8* 9.5* 8.7* 9.1*  HCT 27.8* 29.9* 28.2* 29.3*  MCV 100.0 100.0 101.4* 101.4*  PLT 223 243 236 789   Basic Metabolic Panel: Recent Labs  Lab 08/03/17 2159 08/04/17 0517 08/04/17 0907 08/05/17 0453 08/06/17 0451  NA 141  --  140 140 141  K 3.1*  --  3.5 3.4* 3.9  CL 106  --  103 104 103  CO2 28  --  26 31 31   GLUCOSE 87  --  255* 99 100*  BUN 15  --  13 14 13   CREATININE 0.56  --  0.66 0.62 0.67  CALCIUM 8.4*  --  8.6* 8.4* 8.6*  MG  --  1.7  --  2.4 2.2   GFR: Estimated Creatinine Clearance: 67.9 mL/min (by C-G formula based on SCr of 0.67 mg/dL). Liver Function Tests: Recent Labs  Lab 08/04/17 0907  AST 38  ALT 16  ALKPHOS 71  BILITOT 1.2  PROT 6.5  ALBUMIN 4.1   No results for input(s): LIPASE, AMYLASE in the last 168 hours. No results for input(s): AMMONIA in the last 168 hours. Coagulation Profile: No results for input(s): INR, PROTIME in the last 168 hours. Cardiac Enzymes: No results for input(s): CKTOTAL, CKMB, CKMBINDEX, TROPONINI in the last 168 hours. BNP (last 3 results) No results for input(s): PROBNP in the last 8760 hours. HbA1C: No results for input(s): HGBA1C in the last 72 hours. CBG: Recent Labs  Lab 08/03/17 2106  GLUCAP 64*   Lipid Profile: No results for input(s): CHOL, HDL, LDLCALC, TRIG, CHOLHDL, LDLDIRECT in the last 72 hours. Thyroid Function Tests: Recent Labs    08/04/17 0907  TSH 0.661   Anemia Panel: Recent Labs    08/04/17 0517 08/04/17 0907  VITAMINB12  --  635  FOLATE  --  52.0  FERRITIN  --  558*  TIBC  --  224*   IRON  --  47  RETICCTPCT 7.0*  --    Urine analysis:    Component Value Date/Time   COLORURINE YELLOW 08/03/2017 2321   APPEARANCEUR CLEAR 08/03/2017 2321   LABSPEC 1.020 08/03/2017 2321   LABSPEC 1.025 03/27/2016 0816   PHURINE 5.5 08/03/2017 2321   GLUCOSEU NEGATIVE 08/03/2017 2321   GLUCOSEU Negative 03/27/2016 0816   HGBUR NEGATIVE 08/03/2017 2321   BILIRUBINUR NEGATIVE 08/03/2017 2321   BILIRUBINUR Positive by Dipstix 03/27/2016 0816   KETONESUR TRACE (A) 08/03/2017 2321   PROTEINUR NEGATIVE 08/03/2017 2321   UROBILINOGEN 0.2 03/27/2016 0816   NITRITE NEGATIVE 08/03/2017 2321   LEUKOCYTESUR NEGATIVE 08/03/2017 2321   LEUKOCYTESUR Small 03/27/2016 0816   Sepsis Labs: @LABRCNTIP (procalcitonin:4,lacticidven:4)  )No results found  for this or any previous visit (from the past 240 hour(s)).    Radiology Studies: No results found.   Scheduled Meds: . acetaminophen  650 mg Oral Q6H  . cholecalciferol  2,000 Units Oral Daily  . enoxaparin (LOVENOX) injection  40 mg Subcutaneous Q24H  . folic acid  2 mg Oral Daily  . furosemide  40 mg Oral Daily  . metoprolol tartrate  25 mg Oral QHS  . metoprolol tartrate  50 mg Oral Daily  . pantoprazole  40 mg Oral Daily  . potassium chloride  40 mEq Oral Daily  . predniSONE  60 mg Oral Q breakfast   Continuous Infusions:   LOS: 1 day    Time spent: 30 min    Janece Canterbury, MD [facility] Pager [phone]  If 7PM-7AM, please contact night-coverage [email or web address] Password [email or web address] 08/06/2017, 4:24 PM

## 2017-08-07 ENCOUNTER — Telehealth: Payer: Self-pay | Admitting: Oncology

## 2017-08-07 DIAGNOSIS — E8770 Fluid overload, unspecified: Secondary | ICD-10-CM

## 2017-08-07 LAB — BASIC METABOLIC PANEL
Anion gap: 8 (ref 5–15)
BUN: 18 mg/dL (ref 6–20)
CHLORIDE: 99 mmol/L — AB (ref 101–111)
CO2: 32 mmol/L (ref 22–32)
Calcium: 8.7 mg/dL — ABNORMAL LOW (ref 8.9–10.3)
Creatinine, Ser: 0.66 mg/dL (ref 0.44–1.00)
GFR calc Af Amer: >60 mL/min (ref >60)
GFR calc non Af Amer: >60 mL/min (ref >60)
GLUCOSE: 96 mg/dL (ref 65–99)
POTASSIUM: 3.9 mmol/L (ref 3.5–5.1)
Sodium: 139 mmol/L (ref 135–145)

## 2017-08-07 LAB — MAGNESIUM: Magnesium: 2.2 mg/dL (ref 1.7–2.4)

## 2017-08-07 MED ORDER — FUROSEMIDE 40 MG PO TABS: 40.0000 mg | ORAL_TABLET | Freq: Every day | ORAL | 0 refills | Status: DC

## 2017-08-07 MED ORDER — POTASSIUM CHLORIDE CRYS ER 20 MEQ PO TBCR: 20.0000 meq | EXTENDED_RELEASE_TABLET | Freq: Every day | ORAL | 0 refills | Status: DC

## 2017-08-07 MED ORDER — ALPRAZOLAM 0.25 MG PO TABS: 0.2500 mg | ORAL_TABLET | Freq: Every evening | ORAL | 0 refills | Status: DC | PRN

## 2017-08-07 MED ORDER — PREDNISONE 20 MG PO TABS: 60.0000 mg | ORAL_TABLET | Freq: Every day | ORAL | Status: DC

## 2017-08-07 MED ORDER — LOPERAMIDE HCL 2 MG PO CAPS
2.0000 mg | ORAL_CAPSULE | ORAL | Status: DC | PRN
  Administered 2017-08-07: 2 mg via ORAL
  Filled 2017-08-07: qty 1

## 2017-08-07 MED ORDER — PREDNISONE 20 MG PO TABS: 60.0000 mg | ORAL_TABLET | Freq: Every day | ORAL | 0 refills | Status: DC

## 2017-08-07 NOTE — Discharge Summary (Signed)
Physician Discharge Summary  Michelle Wilcox PZW:[id number] DOB: 06-14-1946 DOA: 08/03/2017  PCP: Cari Caraway, MD  Admit date: 08/03/2017 Discharge date: 08/07/2017  Admitted From: home Disposition:home Recommendations for Outpatient Follow-up:  1. Follow up with PCP in 1-2 weeks 2. Please obtain BMP/CBC in one week  Home Health:none Equipment/Devicesnone Discharge Condition stable CODE STATUS full Diet recommendation cardiac Brief/Interim Summary: 71 year old female with history of temporal arteritis, polymyalgia rheumatica, hemolytic anemia, gout, hypertension, who presented with lower extremity swelling, shortness of breath, and weakness.  She was found to have lower extremity swelling on exam.  Lower extremity duplex is negative.  No evidence of proteinuria on urinalysis.  Echocardiogram is pending.  She is being diuresed with Lasix and is starting to feel better.  She is also more anemic than her baseline.  She recently underwent treatment for influenza.  She is followed by Dr. Benay Spice from oncology and has been undergoing a prednisone taper.  Her case was discussed with Dr. Irene Limbo by phone on 12/16 who recommended starting prednisone burst at 60 mg daily.  Appreciate Dr. Gearldine Shown assistance     Discharge Diagnoses:  Principal Problem:   Volume overload Active Problems:   Hemolytic anemia (HCC)   Hypokalemia   Hypertension   Hyperlipidemia   Mild reactive airways disease  Shortness of breath and lower extremity edema due to new onset acute diastolic heart failure -Urinalysis without proteinuria -TSH within normal limits -Lower extremity duplex: Negative for DVT -Echocardiogram: Ejection fraction 60-65% with evidence of grade 1 diastolic dysfunction, mildly dilated left atrium, mild pulmonary hypertension - continue Lasix 40 mg p.o. daily until she follows up with her PCP   Diarrhea, present since she had the flu.  Requiring frequent imodium and not improving.   Occult stool positive -  GI pathogen panel and C. Diff PCR negative -  F/u with GI  Hemolytic anemia ([place]), baseline hemoglobin of 12-13.  Initial hemoglobin 8.8g/dl.  Hemoglobin approximately stable near 8-9g/dl -Last rituximab infusion was in May -Continue prednisone 60 mg once daily - folate within normal limits, b12 635, iron studies consistent with inflammation -Dr. Benay Spice to follow up on Monday as outpatient  Hypokalemia, recurred due to diuresis.  Continue oral potassium supplementation -Magnesium level was 1.7, gave magnesium sulfate 2 g IV once  Persistent cough may be secondary to her recent viral illness, alternatively this may reflect some acute diastolic heart failure vs. Asthma exacerbation, improving -Cough syrup -Diuresing as above      Allergies as of 08/07/2017      Reactions   Penicillins Anaphylaxis   Has patient had a PCN reaction causing immediate rash, facial/tongue/throat swelling, SOB or lightheadedness with hypotension: yes Has patient had a PCN reaction causing severe rash involving mucus membranes or skin necrosis: no Has patient had a PCN reaction that required hospitalization: yes Has patient had a PCN reaction occurring within the last 10 years: no If all of the above answers are "NO", then may proceed with Cephalosporin use.   Ceftin [cefuroxime Axetil]    Ciprofloxacin    Darvocet [propoxyphene N-acetaminophen]    Levaquin [levofloxacin In D5w] Hives   Sulfa Antibiotics Hives   Zyrtec [cetirizine Hcl]    headache      Medication List    STOP taking these medications   acetaminophen 650 MG CR tablet Commonly known as:  TYLENOL   loperamide 2 MG capsule Commonly known as:  IMODIUM     TAKE these medications   ALPRAZolam 0.25 MG tablet Commonly  known as:  XANAX Take 1 tablet (0.25 mg total) by mouth at bedtime as needed for anxiety.   cholecalciferol 1000 units tablet Commonly known as:  VITAMIN D Take 2,000 Units by mouth  daily.   folic acid 1 MG tablet Commonly known as:  FOLVITE TAKE 1 TABLET BY MOUTH DAILY   furosemide 40 MG tablet Commonly known as:  LASIX Take 1 tablet (40 mg total) by mouth daily. Start taking on:  08/08/2017   GAS-X 80 MG chewable tablet Generic drug:  simethicone Chew 80 mg by mouth every 6 (six) hours as needed for flatulence.   metoprolol tartrate 50 MG tablet Commonly known as:  LOPRESSOR Take 25-50 mg by mouth 2 (two) times daily. Takes 50 mg in AM and 25 mg in PM, may take an additional 25 mg prf high blood pressure. Pat had 25 mg of metoprolol at 1400 and is considering taking the other 25 mg.   multivitamin tablet Take 1 tablet by mouth every evening.   omeprazole 20 MG tablet Commonly known as:  PRILOSEC OTC Take 20 mg by mouth every morning.   potassium chloride SA 20 MEQ tablet Commonly known as:  K-DUR,KLOR-CON Take 1 tablet (20 mEq total) by mouth daily. Start taking on:  08/08/2017   predniSONE 20 MG tablet Commonly known as:  DELTASONE Take 3 tablets (60 mg total) by mouth daily with breakfast. Start taking on:  08/08/2017 What changed:    medication strength  how much to take  Another medication with the same name was removed. Continue taking this medication, and follow the directions you see here.   VENTOLIN HFA 108 (90 Base) MCG/ACT inhaler Generic drug:  albuterol Take 1-2 puffs by mouth every 4 (four) hours as needed for shortness of breath.      Follow-up Information    Ladell Pier, MD. Schedule an appointment as soon as possible for a visit in 1 week(s).   Specialty:  Oncology Contact information: [place] [address] [phone]        Cari Caraway, MD Follow up.   Specialty:  Family Medicine Contact information: [place] [address] [phone]        Clarene Essex, MD Follow up.   Specialty:  Gastroenterology Contact information: [address] [place] [address] [phone]          Allergies  Allergen Reactions  . Penicillins Anaphylaxis    Has patient had a PCN reaction causing immediate rash, facial/tongue/throat swelling, SOB or lightheadedness with hypotension: yes Has patient had a PCN reaction causing severe rash involving mucus membranes or skin necrosis: no Has patient had a PCN reaction that required hospitalization: yes Has patient had a PCN reaction occurring within the last 10 years: no If all of the above answers are "NO", then may proceed with Cephalosporin use.   . Ceftin [Cefuroxime Axetil]   . Ciprofloxacin   . Darvocet [Propoxyphene N-Acetaminophen]   . Levaquin [Levofloxacin In D5w] Hives  . Sulfa Antibiotics Hives  . Zyrtec [Cetirizine Hcl]     headache    Consultations:dr sherril   Procedures/Studies: Dg Chest 2 View  Result Date: 08/03/2017 CLINICAL DATA:  Dyspnea times 3-4 days with hypertension. History of hemolytic anemia. Nonsmoker. EXAM: CHEST  2 VIEW COMPARISON:  12/17/2016 FINDINGS: Stable cardiomegaly. Tortuous thoracic aorta with atherosclerotic calcifications at the arch. Minimal atelectasis at the right lung base. No effusion or pneumothorax. No pulmonary consolidations. Stable mild  degenerative change along the dorsal spine and both shoulders. IMPRESSION: Stable cardiomegaly and aortic atherosclerosis. No active pulmonary disease. Electronically Signed   By: Ashley Royalty M.D.   On: 08/03/2017 18:38    (Echo, Carotid, EGD, Colonoscopy, ERCP)    Subjective:   Discharge Exam: Vitals:   08/06/17 2113 08/07/17 0529  BP: 115/78 (!) 115/54  Pulse: 80 72  Resp: 16 16  Temp: 98 F (36.7 C) 97.6 F (36.4 C)  SpO2: 97% 95%   Vitals:   08/06/17 0432 08/06/17 1740 08/06/17 2113 08/07/17 0529  BP: 121/76 (!) 135/57 115/78 (!) 115/54  Pulse: 69 72 80 72  Resp: 12 16 16 16   Temp: 98.2 F (36.8 C) 97.7 F (36.5 C) 98 F (36.7 C) 97.6 F (36.4 C)  TempSrc: Oral Oral Oral  Oral  SpO2: 96% 99% 97% 95%  Weight: 87.1 kg (192 lb 0.3 oz)     Height:        General: Pt is alert, awake, not in acute distress Cardiovascular: RRR, S1/S2 +, no rubs, no gallops Respiratory: CTA bilaterally, no wheezing, no rhonchi Abdominal: Soft, NT, ND, bowel sounds + Extremities: no edema, no cyanosis    The results of significant diagnostics from this hospitalization (including imaging, microbiology, ancillary and laboratory) are listed below for reference.     Microbiology: Recent Results (from the past 240 hour(s))  Gastrointestinal Panel by PCR , Stool     Status: None   Collection Time: 08/06/17 10:25 AM  Result Value Ref Range Status   Campylobacter species NOT DETECTED NOT DETECTED Final   Plesimonas shigelloides NOT DETECTED NOT DETECTED Final   Salmonella species NOT DETECTED NOT DETECTED Final   Yersinia enterocolitica NOT DETECTED NOT DETECTED Final   Vibrio species NOT DETECTED NOT DETECTED Final   Vibrio cholerae NOT DETECTED NOT DETECTED Final   Enteroaggregative E coli (EAEC) NOT DETECTED NOT DETECTED Final   Enteropathogenic E coli (EPEC) NOT DETECTED NOT DETECTED Final   Enterotoxigenic E coli (ETEC) NOT DETECTED NOT DETECTED Final   Shiga like toxin producing E coli (STEC) NOT DETECTED NOT DETECTED Final   Shigella/Enteroinvasive E coli (EIEC) NOT DETECTED NOT DETECTED Final   Cryptosporidium NOT DETECTED NOT DETECTED Final   Cyclospora cayetanensis NOT DETECTED NOT DETECTED Final   Entamoeba histolytica NOT DETECTED NOT DETECTED Final   Giardia lamblia NOT DETECTED NOT DETECTED Final   Adenovirus F40/41 NOT DETECTED NOT DETECTED Final   Astrovirus NOT DETECTED NOT DETECTED Final   Norovirus GI/GII NOT DETECTED NOT DETECTED Final   Rotavirus A NOT DETECTED NOT DETECTED Final   Sapovirus (I, II, IV, and V) NOT DETECTED NOT DETECTED Final     Labs: BNP (last 3 results) Recent Labs    12/17/16 0614 08/03/17 2200  BNP 83.3 282.5*   Basic  Metabolic Panel: Recent Labs  Lab 08/03/17 2159 08/04/17 0517 08/04/17 0907 08/05/17 0453 08/06/17 0451 08/07/17 0449  NA 141  --  140 140 141 139  K 3.1*  --  3.5 3.4* 3.9 3.9  CL 106  --  103 104 103 99*  CO2 28  --  26 31 31  32  GLUCOSE 87  --  255* 99 100* 96  BUN 15  --  13 14 13 18   CREATININE 0.56  --  0.66 0.62 0.67 0.66  CALCIUM 8.4*  --  8.6* 8.4* 8.6* 8.7*  MG  --  1.7  --  2.4 2.2 2.2   Liver Function Tests: Recent  Labs  Lab 08/04/17 0907  AST 38  ALT 16  ALKPHOS 71  BILITOT 1.2  PROT 6.5  ALBUMIN 4.1   No results for input(s): LIPASE, AMYLASE in the last 168 hours. No results for input(s): AMMONIA in the last 168 hours. CBC: Recent Labs  Lab 08/03/17 2159 08/04/17 0517 08/05/17 0453 08/06/17 0451  WBC 6.1 7.8 7.4 8.5  NEUTROABS  --  7.2 5.7 6.4  HGB 8.8* 9.5* 8.7* 9.1*  HCT 27.8* 29.9* 28.2* 29.3*  MCV 100.0 100.0 101.4* 101.4*  PLT 223 243 236 237   Cardiac Enzymes: No results for input(s): CKTOTAL, CKMB, CKMBINDEX, TROPONINI in the last 168 hours. BNP: Invalid input(s): POCBNP CBG: Recent Labs  Lab 08/03/17 2106  GLUCAP 64*   D-Dimer No results for input(s): DDIMER in the last 72 hours. Hgb A1c No results for input(s): HGBA1C in the last 72 hours. Lipid Profile No results for input(s): CHOL, HDL, LDLCALC, TRIG, CHOLHDL, LDLDIRECT in the last 72 hours. Thyroid function studies No results for input(s): TSH, T4TOTAL, T3FREE, THYROIDAB in the last 72 hours.  Invalid input(s): FREET3 Anemia work up No results for input(s): VITAMINB12, FOLATE, FERRITIN, TIBC, IRON, RETICCTPCT in the last 72 hours. Urinalysis    Component Value Date/Time   COLORURINE YELLOW 08/03/2017 2321   APPEARANCEUR CLEAR 08/03/2017 2321   LABSPEC 1.020 08/03/2017 2321   LABSPEC 1.025 03/27/2016 0816   PHURINE 5.5 08/03/2017 2321   GLUCOSEU NEGATIVE 08/03/2017 2321   GLUCOSEU Negative 03/27/2016 0816   HGBUR NEGATIVE 08/03/2017 2321   BILIRUBINUR NEGATIVE  08/03/2017 2321   BILIRUBINUR Positive by Dipstix 03/27/2016 0816   KETONESUR TRACE (A) 08/03/2017 2321   PROTEINUR NEGATIVE 08/03/2017 2321   UROBILINOGEN 0.2 03/27/2016 0816   NITRITE NEGATIVE 08/03/2017 2321   LEUKOCYTESUR NEGATIVE 08/03/2017 2321   LEUKOCYTESUR Small 03/27/2016 0816   Sepsis Labs Invalid input(s): PROCALCITONIN,  WBC,  LACTICIDVEN Microbiology Recent Results (from the past 240 hour(s))  Gastrointestinal Panel by PCR , Stool     Status: None   Collection Time: 08/06/17 10:25 AM  Result Value Ref Range Status   Campylobacter species NOT DETECTED NOT DETECTED Final   Plesimonas shigelloides NOT DETECTED NOT DETECTED Final   Salmonella species NOT DETECTED NOT DETECTED Final   Yersinia enterocolitica NOT DETECTED NOT DETECTED Final   Vibrio species NOT DETECTED NOT DETECTED Final   Vibrio cholerae NOT DETECTED NOT DETECTED Final   Enteroaggregative E coli (EAEC) NOT DETECTED NOT DETECTED Final   Enteropathogenic E coli (EPEC) NOT DETECTED NOT DETECTED Final   Enterotoxigenic E coli (ETEC) NOT DETECTED NOT DETECTED Final   Shiga like toxin producing E coli (STEC) NOT DETECTED NOT DETECTED Final   Shigella/Enteroinvasive E coli (EIEC) NOT DETECTED NOT DETECTED Final   Cryptosporidium NOT DETECTED NOT DETECTED Final   Cyclospora cayetanensis NOT DETECTED NOT DETECTED Final   Entamoeba histolytica NOT DETECTED NOT DETECTED Final   Giardia lamblia NOT DETECTED NOT DETECTED Final   Adenovirus F40/41 NOT DETECTED NOT DETECTED Final   Astrovirus NOT DETECTED NOT DETECTED Final   Norovirus GI/GII NOT DETECTED NOT DETECTED Final   Rotavirus A NOT DETECTED NOT DETECTED Final   Sapovirus (I, II, IV, and V) NOT DETECTED NOT DETECTED Final     Time coordinating discharge: Over 30 minutes  SIGNED:   Georgette Shell, MD  [facility] 08/07/2017, 10:25 AM Pager   If 7PM-7AM, please contact night-coverage [email or web address] Password [email or web address]

## 2017-08-07 NOTE — Telephone Encounter (Signed)
R/s appt per 12/18 sch message - patient verbalized understanding to come in 12/24 instead of 12/27 and disregard letter in the mail.

## 2017-08-09 ENCOUNTER — Telehealth: Payer: Self-pay | Admitting: Oncology

## 2017-08-09 NOTE — Telephone Encounter (Signed)
Called patient and let her know that her lab on 12/24 was moved to 1:30pm.

## 2017-08-12 ENCOUNTER — Ambulatory Visit (HOSPITAL_BASED_OUTPATIENT_CLINIC_OR_DEPARTMENT_OTHER): Payer: Medicare Other | Admitting: Oncology

## 2017-08-12 ENCOUNTER — Other Ambulatory Visit (HOSPITAL_BASED_OUTPATIENT_CLINIC_OR_DEPARTMENT_OTHER): Payer: Medicare Other

## 2017-08-12 ENCOUNTER — Telehealth: Payer: Self-pay | Admitting: Oncology

## 2017-08-12 VITALS — BP 133/62 | HR 78 | Temp 98.2°F | Resp 18 | Ht 64.0 in | Wt 187.3 lb

## 2017-08-12 DIAGNOSIS — D591 Autoimmune hemolytic anemia, unspecified: Secondary | ICD-10-CM

## 2017-08-12 LAB — COMPREHENSIVE METABOLIC PANEL
ALT: 11 U/L (ref 0–55)
AST: 18 U/L (ref 5–34)
Albumin: 4.6 g/dL (ref 3.5–5.0)
Alkaline Phosphatase: 87 U/L (ref 40–150)
Anion Gap: 14 mEq/L — ABNORMAL HIGH (ref 3–11)
BUN: 29.4 mg/dL — AB (ref 7.0–26.0)
CHLORIDE: 101 meq/L (ref 98–109)
CO2: 27 mEq/L (ref 22–29)
Calcium: 9.3 mg/dL (ref 8.4–10.4)
Creatinine: 1.1 mg/dL (ref 0.6–1.1)
EGFR: 50 mL/min/{1.73_m2} — ABNORMAL LOW (ref >60)
GLUCOSE: 156 mg/dL — AB (ref 70–140)
POTASSIUM: 4.2 meq/L (ref 3.5–5.1)
SODIUM: 142 meq/L (ref 136–145)
Total Bilirubin: 1.24 mg/dL — ABNORMAL HIGH (ref 0.20–1.20)
Total Protein: 6.6 g/dL (ref 6.4–8.3)

## 2017-08-12 LAB — CBC & DIFF AND RETIC
BASO%: 0.1 % (ref 0.0–2.0)
BASOS ABS: 0 10*3/uL (ref 0.0–0.1)
EOS%: 0.1 % (ref 0.0–7.0)
Eosinophils Absolute: 0 10*3/uL (ref 0.0–0.5)
HEMATOCRIT: 39.2 % (ref 34.8–46.6)
HGB: 11.9 g/dL (ref 11.6–15.9)
IMMATURE RETIC FRACT: 8.2 % (ref 1.60–10.00)
LYMPH#: 0.4 10*3/uL — AB (ref 0.9–3.3)
LYMPH%: 2.6 % — ABNORMAL LOW (ref 14.0–49.7)
MCH: 32.2 pg (ref 25.1–34.0)
MCHC: 30.4 g/dL — ABNORMAL LOW (ref 31.5–36.0)
MCV: 105.9 fL — ABNORMAL HIGH (ref 79.5–101.0)
MONO#: 0.1 10*3/uL (ref 0.1–0.9)
MONO%: 0.5 % (ref 0.0–14.0)
NEUT#: 13.1 10*3/uL — ABNORMAL HIGH (ref 1.5–6.5)
NEUT%: 96.7 % — AB (ref 38.4–76.8)
PLATELETS: 347 10*3/uL (ref 145–400)
RBC: 3.7 10*6/uL (ref 3.70–5.45)
RDW: 16.4 % — ABNORMAL HIGH (ref 11.2–14.5)
RETIC %: 7.56 % — AB (ref 0.70–2.10)
RETIC CT ABS: 279.72 10*3/uL — AB (ref 33.70–90.70)
WBC: 13.6 10*3/uL — ABNORMAL HIGH (ref 3.9–10.3)

## 2017-08-12 LAB — LACTATE DEHYDROGENASE: LDH: 250 U/L — ABNORMAL HIGH (ref 125–245)

## 2017-08-12 NOTE — Telephone Encounter (Signed)
Gave avs and calendar for december and January

## 2017-08-12 NOTE — Progress Notes (Signed)
[facility] OFFICE PROGRESS NOTE   Diagnosis: Hemolytic anemia  INTERVAL HISTORY:   Michelle Wilcox returns as scheduled.  She was discharged 08/07/2017 after admission with recurrent hemolytic anemia and volume overload.  She is maintained on prednisone at a dose of 60 mg daily.  She feels better compared to hospital admission.  The leg edema has resolved.  She continues to have a nonproductive cough.   Objective:  Vital signs in last 24 hours:  Blood pressure 133/62, pulse 78, temperature 98.2 F (36.8 C), temperature source Oral, resp. rate 18, height 5\' 4"  (1.626 m), weight 187 lb 4.8 oz (85 kg), SpO2 98 %.    HEENT: No thrush Resp: End inspiratory rales at the left posterior base, no respiratory distress Cardio: Regular rate and rhythm GI: No hepatosplenomegaly Vascular: No leg edema    Lab Results:  Lab Results  Component Value Date   WBC 13.6 (H) 08/12/2017   HGB 11.9 08/12/2017   HCT 39.2 08/12/2017   MCV 105.9 (H) 08/12/2017   PLT 347 08/12/2017   NEUTROABS 13.1 (H) 08/12/2017    CMP     Component Value Date/Time   NA 142 08/12/2017 1254   K 4.2 08/12/2017 1254   CL 99 (L) 08/07/2017 0449   CO2 27 08/12/2017 1254   GLUCOSE 156 (H) 08/12/2017 1254   BUN 29.4 (H) 08/12/2017 1254   CREATININE 1.1 08/12/2017 1254   CALCIUM 9.3 08/12/2017 1254   PROT 6.6 08/12/2017 1254   ALBUMIN 4.6 08/12/2017 1254   AST 18 08/12/2017 1254   ALT 11 08/12/2017 1254   ALKPHOS 87 08/12/2017 1254   BILITOT 1.24 (H) 08/12/2017 1254   GFRNONAA >60 08/07/2017 0449   GFRAA >60 08/07/2017 0449    No results found for: CEA1  No results found for: INR  Imaging:  No results found.  Medications: I have reviewed the patient's current medications.   Assessment/Plan: 1. 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, DAT IgG positive  Prednisone started 03/20/2016  Tapered to 15 mg daily on 04/30/2016  Hemoglobin lower, elevated  LDH 05/21/2016-prednisone increased to 30 mgdaily  Prednisone taper to 25 mg daily 06/18/2016  Prednisone taper to 20 mg daily 07/16/2016  Prednisone taper to 15 mg daily 08/27/2016  Prednisone taper to 12.5 mg daily 09/25/2016  Hemoglobin lower, prednisone increased to 20 mgdaily on 10/11/2016  Cycle 1 rituximab 11/19/2016  Cycle 2 rituximab 11/26/2016  Cycle 3 rituximab 12/03/2016  Cycle 4 rituximab 12/28/2016  Prednisone taper to 15 mg daily 12/28/2016  Prednisone taper to 12.5 mg daily 01/28/2017  Prednisone taper to 10 mg daily 02/19/2017  Prednisone taper to 7.5 mg daily 04/01/2017  Prednisone taper to 5 mg daily 05/20/2017  Admission with progressive anemia and evidence of hemolysis 08/03/2017  Prednisone resumed at a dose of 60 mg daily  Prednisone taper to 40 mg 08/12/2017  2. History ofExertional dyspnea secondary to #1  3. Mild splenomegaly on CT 03/16/2016  4. History of polymyalgia rheumatica  5. History of giant cell/temporal arteritis-maintained on methotrexate in the remote past, now on low dose prednisone  6.   Admission with leg edema 89/21/1941- diastolic heart failure?,  Exacerbated by anemia   Disposition: Ms. Desautel has recurrent hemolytic anemia.  It is possible this is related to a recent upper respiratory infection/flulike illness.  The hemoglobin has improved with higher dose prednisone.  We tapered the prednisone to 40 mg daily.  She will return for a CBC on  08/19/2017.  The plan is to taper the prednisone to 30 mg daily if the hemoglobin is stable or improved on 08/19/2017.  Ms. Zarr will return for an office and lab visit on 09/02/2017.   Michelle Coder, MD  08/12/2017  2:08 PM

## 2017-08-15 ENCOUNTER — Other Ambulatory Visit: Payer: Medicare Other

## 2017-08-15 ENCOUNTER — Ambulatory Visit: Payer: Self-pay | Admitting: Nurse Practitioner

## 2017-08-15 DIAGNOSIS — I10 Essential (primary) hypertension: Secondary | ICD-10-CM | POA: Diagnosis not present

## 2017-08-15 DIAGNOSIS — Z800 Family history of malignant neoplasm of digestive organs: Secondary | ICD-10-CM | POA: Diagnosis not present

## 2017-08-15 DIAGNOSIS — D591 Other autoimmune hemolytic anemias: Secondary | ICD-10-CM | POA: Diagnosis not present

## 2017-08-15 DIAGNOSIS — R197 Diarrhea, unspecified: Secondary | ICD-10-CM | POA: Diagnosis not present

## 2017-08-15 DIAGNOSIS — E876 Hypokalemia: Secondary | ICD-10-CM | POA: Diagnosis not present

## 2017-08-15 DIAGNOSIS — I5032 Chronic diastolic (congestive) heart failure: Secondary | ICD-10-CM | POA: Diagnosis not present

## 2017-08-16 ENCOUNTER — Other Ambulatory Visit: Payer: Self-pay | Admitting: Oncology

## 2017-08-19 ENCOUNTER — Other Ambulatory Visit: Payer: Self-pay

## 2017-08-21 ENCOUNTER — Telehealth: Payer: Self-pay | Admitting: *Deleted

## 2017-08-21 MED ORDER — PREDNISONE 20 MG PO TABS: 40.0000 mg | ORAL_TABLET | Freq: Every day | ORAL | 0 refills | Status: DC

## 2017-08-21 NOTE — Telephone Encounter (Signed)
Refilled prednisone and new dose for 40 mg daily asked patient to call regarding missed lab appt.

## 2017-08-23 ENCOUNTER — Other Ambulatory Visit (HOSPITAL_BASED_OUTPATIENT_CLINIC_OR_DEPARTMENT_OTHER): Payer: Medicare Other

## 2017-08-23 ENCOUNTER — Telehealth: Payer: Self-pay | Admitting: *Deleted

## 2017-08-23 DIAGNOSIS — D591 Autoimmune hemolytic anemia, unspecified: Secondary | ICD-10-CM

## 2017-08-23 LAB — CBC WITH DIFFERENTIAL/PLATELET
BASO%: 0.4 % (ref 0.0–2.0)
Basophils Absolute: 0 10*3/uL (ref 0.0–0.1)
EOS%: 1.1 % (ref 0.0–7.0)
Eosinophils Absolute: 0.1 10*3/uL (ref 0.0–0.5)
HCT: 38.7 % (ref 34.8–46.6)
HGB: 12.3 g/dL (ref 11.6–15.9)
LYMPH%: 5.1 % — AB (ref 14.0–49.7)
MCH: 32 pg (ref 25.1–34.0)
MCHC: 31.8 g/dL (ref 31.5–36.0)
MCV: 100.5 fL (ref 79.5–101.0)
MONO#: 0.2 10*3/uL (ref 0.1–0.9)
MONO%: 2.1 % (ref 0.0–14.0)
NEUT%: 91.3 % — AB (ref 38.4–76.8)
NEUTROS ABS: 10.2 10*3/uL — AB (ref 1.5–6.5)
PLATELETS: 235 10*3/uL (ref 145–400)
RBC: 3.85 10*6/uL (ref 3.70–5.45)
RDW: 16.8 % — ABNORMAL HIGH (ref 11.2–14.5)
WBC: 11.1 10*3/uL — AB (ref 3.9–10.3)
lymph#: 0.6 10*3/uL — ABNORMAL LOW (ref 0.9–3.3)

## 2017-08-23 NOTE — Telephone Encounter (Signed)
Called pt with instructions to decrease prednisone to 30 mg. Pt hesitant to do so but agreed to take 1.5 tablets daily until next appt. Encouraged her to call office with any changes or concerns.

## 2017-08-23 NOTE — Telephone Encounter (Signed)
-----   Message from Ladell Pier, MD sent at 08/23/2017  5:48 PM EST ----- Please call patient, hb is better, decrease prednisone to 30mg  daily

## 2017-09-02 ENCOUNTER — Encounter: Payer: Self-pay | Admitting: Nurse Practitioner

## 2017-09-02 ENCOUNTER — Other Ambulatory Visit: Payer: Self-pay | Admitting: Nurse Practitioner

## 2017-09-02 ENCOUNTER — Inpatient Hospital Stay: Payer: Medicare Other

## 2017-09-02 ENCOUNTER — Telehealth: Payer: Self-pay | Admitting: Oncology

## 2017-09-02 ENCOUNTER — Inpatient Hospital Stay: Payer: Medicare Other | Attending: Nurse Practitioner | Admitting: Nurse Practitioner

## 2017-09-02 VITALS — BP 124/47 | HR 69 | Temp 97.9°F | Resp 18 | Ht 64.0 in | Wt 191.1 lb

## 2017-09-02 DIAGNOSIS — D591 Autoimmune hemolytic anemia, unspecified: Secondary | ICD-10-CM

## 2017-09-02 DIAGNOSIS — M353 Polymyalgia rheumatica: Secondary | ICD-10-CM | POA: Diagnosis not present

## 2017-09-02 DIAGNOSIS — Z79899 Other long term (current) drug therapy: Secondary | ICD-10-CM | POA: Diagnosis not present

## 2017-09-02 DIAGNOSIS — R161 Splenomegaly, not elsewhere classified: Secondary | ICD-10-CM | POA: Diagnosis not present

## 2017-09-02 DIAGNOSIS — E876 Hypokalemia: Secondary | ICD-10-CM

## 2017-09-02 LAB — CBC WITH DIFFERENTIAL/PLATELET
Basophils Absolute: 0 10*3/uL (ref 0.0–0.1)
Basophils Relative: 0 %
EOS PCT: 1 %
Eosinophils Absolute: 0.1 10*3/uL (ref 0.0–0.5)
HEMATOCRIT: 39.5 % (ref 34.8–46.6)
HEMOGLOBIN: 12.5 g/dL (ref 11.6–15.9)
LYMPHS ABS: 0.9 10*3/uL (ref 0.9–3.3)
LYMPHS PCT: 8 %
MCH: 32.7 pg (ref 25.1–34.0)
MCHC: 31.6 g/dL (ref 31.5–36.0)
MCV: 103.4 fL — AB (ref 79.5–101.0)
Monocytes Absolute: 0.5 10*3/uL (ref 0.1–0.9)
Monocytes Relative: 4 %
NEUTROS ABS: 9.4 10*3/uL — AB (ref 1.5–6.5)
Neutrophils Relative %: 87 %
PLATELETS: 196 10*3/uL (ref 145–400)
RBC: 3.82 MIL/uL (ref 3.70–5.45)
RDW: 15.3 % (ref 11.2–16.1)
WBC: 10.8 10*3/uL — AB (ref 3.9–10.3)

## 2017-09-02 LAB — COMPREHENSIVE METABOLIC PANEL
ALK PHOS: 87 U/L (ref 40–150)
ALT: 14 U/L (ref 0–55)
AST: 20 U/L (ref 5–34)
Albumin: 3.9 g/dL (ref 3.5–5.0)
Anion gap: 10 (ref 3–11)
BILIRUBIN TOTAL: 0.6 mg/dL (ref 0.2–1.2)
BUN: 33 mg/dL — ABNORMAL HIGH (ref 7–26)
CALCIUM: 8.9 mg/dL (ref 8.4–10.4)
CO2: 28 mmol/L (ref 22–29)
CREATININE: 0.96 mg/dL (ref 0.60–1.10)
Chloride: 103 mmol/L (ref 98–109)
GFR calc Af Amer: >60 mL/min (ref >60)
GFR, EST NON AFRICAN AMERICAN: 58 mL/min — AB (ref >60)
Glucose, Bld: 96 mg/dL (ref 70–140)
Potassium: 4.1 mmol/L (ref 3.3–4.7)
Sodium: 141 mmol/L (ref 136–145)
TOTAL PROTEIN: 5.8 g/dL — AB (ref 6.4–8.3)

## 2017-09-02 LAB — RETICULOCYTES
RBC.: 3.82 MIL/uL (ref 3.70–5.45)
RETIC CT PCT: 3.6 % — AB (ref 0.7–2.1)
Retic Count, Absolute: 137.5 10*3/uL — ABNORMAL HIGH (ref 33.7–90.7)

## 2017-09-02 MED ORDER — POTASSIUM CHLORIDE CRYS ER 20 MEQ PO TBCR: 20.0000 meq | EXTENDED_RELEASE_TABLET | Freq: Every day | ORAL | 0 refills | Status: DC

## 2017-09-02 NOTE — Progress Notes (Signed)
  [place] OFFICE PROGRESS NOTE   Diagnosis: Hemolytic anemia  INTERVAL HISTORY:   Ms. Slappey returns as scheduled.  She began prednisone 30 mg daily 08/24/2017.  She denies significant fatigue.  No thrush.  No leg swelling.  She denies shortness of breath.  Objective:  Vital signs in last 24 hours:  Blood pressure (!) 124/47, pulse 69, temperature 97.9 F (36.6 C), temperature source Oral, resp. rate 18, height 5\' 4"  (1.626 m), weight 191 lb 1.6 oz (86.7 kg), SpO2 99 %.    HEENT: No thrush or ulcers. Resp: Lungs clear bilaterally. Cardio: Regular rate and rhythm. GI: Abdomen soft and nontender.  No hepatosplenomegaly. Vascular: No leg edema.  Calves soft and nontender.  Lab Results:  Lab Results  Component Value Date   WBC 10.8 (H) 09/02/2017   HGB 12.5 09/02/2017   HCT 39.5 09/02/2017   MCV 103.4 (H) 09/02/2017   PLT 196 09/02/2017   NEUTROABS 9.4 (H) 09/02/2017    Imaging:  No results found.  Medications: I have reviewed the patient's current medications.  Assessment/Plan: 1. 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5 mg daily 01/28/2017  Prednisone taper to 10 mg daily 02/19/2017  Prednisone taper to 7.5 mg daily 04/01/2017  Prednisone taper to 5 mg daily 05/20/2017  Admission with progressive  anemia and evidence of hemolysis 08/03/2017  Prednisone resumed at a dose of 60 mg daily  Prednisone taper to 40 mg 08/12/2017  Prednisone taper to 30 mg daily 08/24/2017  Prednisone taper to 20 mg daily 09/03/2017  2. History ofExertional dyspnea secondary to #1  3. Mild splenomegaly on CT 03/16/2016  4. History of polymyalgia rheumatica  5. History of giant cell/temporal arteritis-maintained on methotrexate in the remote past, now on low dose prednisone  6.Admission with leg edema 42/68/3419- diastolic heart failure?,  Exacerbated by anemia    Disposition: Ms. Cerritos remains stable from a hematologic standpoint.  She will taper the prednisone to 20 mg daily.  Plan for follow-up CBC in 2 weeks.  Labs and office visit in 6 weeks.  She will contact the office in the interim with any problems.  Patient seen with Dr. Benay Spice.    Ned Card ANP/GNP-BC   09/02/2017  10:04 AM This was a shared visit with Ned Card.  The hemoglobin has improved with prednisone.  She will continue a prednisone taper as tolerated.  [name], MD

## 2017-09-02 NOTE — Telephone Encounter (Signed)
Gave avs and calendar for January and February  °

## 2017-09-04 ENCOUNTER — Other Ambulatory Visit: Payer: Self-pay | Admitting: Emergency Medicine

## 2017-09-16 ENCOUNTER — Inpatient Hospital Stay: Payer: Medicare Other

## 2017-09-16 DIAGNOSIS — D591 Autoimmune hemolytic anemia, unspecified: Secondary | ICD-10-CM

## 2017-09-16 DIAGNOSIS — M353 Polymyalgia rheumatica: Secondary | ICD-10-CM | POA: Diagnosis not present

## 2017-09-16 DIAGNOSIS — Z79899 Other long term (current) drug therapy: Secondary | ICD-10-CM | POA: Diagnosis not present

## 2017-09-16 DIAGNOSIS — R161 Splenomegaly, not elsewhere classified: Secondary | ICD-10-CM | POA: Diagnosis not present

## 2017-09-16 LAB — CBC WITH DIFFERENTIAL (CANCER CENTER ONLY)
Basophils Absolute: 0.1 10*3/uL (ref 0.0–0.1)
Basophils Relative: 1 %
EOS PCT: 1 %
Eosinophils Absolute: 0.1 10*3/uL (ref 0.0–0.5)
HCT: 40.9 % (ref 34.8–46.6)
Hemoglobin: 13.6 g/dL (ref 11.6–15.9)
LYMPHS ABS: 0.8 10*3/uL — AB (ref 0.9–3.3)
LYMPHS PCT: 7 %
MCH: 32 pg (ref 25.1–34.0)
MCHC: 33.2 g/dL (ref 31.5–36.0)
MCV: 96.6 fL (ref 79.5–101.0)
MONO ABS: 0.4 10*3/uL (ref 0.1–0.9)
Monocytes Relative: 4 %
Neutro Abs: 9.5 10*3/uL — ABNORMAL HIGH (ref 1.5–6.5)
Neutrophils Relative %: 87 %
Platelet Count: 339 10*3/uL (ref 145–400)
RBC: 4.23 MIL/uL (ref 3.70–5.45)
RDW: 15.5 % (ref 11.2–16.1)
WBC Count: 10.8 10*3/uL — ABNORMAL HIGH (ref 3.9–10.3)

## 2017-09-16 LAB — RETICULOCYTES
RBC.: 4.21 MIL/uL (ref 3.70–5.45)
Retic Count, Absolute: 134.7 10*3/uL — ABNORMAL HIGH (ref 33.7–90.7)
Retic Ct Pct: 3.2 % — ABNORMAL HIGH (ref 0.7–2.1)

## 2017-09-20 ENCOUNTER — Telehealth: Payer: Self-pay | Admitting: *Deleted

## 2017-09-20 ENCOUNTER — Other Ambulatory Visit: Payer: Self-pay | Admitting: *Deleted

## 2017-09-20 MED ORDER — PREDNISONE 20 MG PO TABS: 10.0000 mg | ORAL_TABLET | Freq: Every day | ORAL | 0 refills | Status: DC

## 2017-09-20 MED ORDER — PREDNISONE 10 MG PO TABS: 15.0000 mg | ORAL_TABLET | Freq: Every day | ORAL | 0 refills | Status: DC

## 2017-09-20 NOTE — Telephone Encounter (Signed)
Was instructed to call patient and instruct to dose reduce Prednisone to 15 mg daily.  Need to determine what doses the patient has at home and if she will have appropriate dose already on hand to accommodate dose reduction if not then we will reorder Prednisone 10mg  to accommodate the new dose and future doses.  No answer when attempting to reach patient, lm for returned call and further instructions.

## 2017-10-14 ENCOUNTER — Inpatient Hospital Stay: Payer: Medicare Other | Attending: Nurse Practitioner | Admitting: Oncology

## 2017-10-14 ENCOUNTER — Encounter: Payer: Self-pay | Admitting: Oncology

## 2017-10-14 ENCOUNTER — Inpatient Hospital Stay: Payer: Medicare Other

## 2017-10-14 ENCOUNTER — Telehealth: Payer: Self-pay | Admitting: Oncology

## 2017-10-14 VITALS — BP 108/64 | HR 64 | Temp 97.8°F | Resp 18 | Ht 64.0 in | Wt 189.5 lb

## 2017-10-14 DIAGNOSIS — Z79899 Other long term (current) drug therapy: Secondary | ICD-10-CM | POA: Insufficient documentation

## 2017-10-14 DIAGNOSIS — D591 Autoimmune hemolytic anemia, unspecified: Secondary | ICD-10-CM

## 2017-10-14 DIAGNOSIS — R161 Splenomegaly, not elsewhere classified: Secondary | ICD-10-CM | POA: Diagnosis not present

## 2017-10-14 DIAGNOSIS — R740 Nonspecific elevation of levels of transaminase and lactic acid dehydrogenase [LDH]: Secondary | ICD-10-CM | POA: Insufficient documentation

## 2017-10-14 DIAGNOSIS — M353 Polymyalgia rheumatica: Secondary | ICD-10-CM | POA: Insufficient documentation

## 2017-10-14 LAB — CBC WITH DIFFERENTIAL (CANCER CENTER ONLY)
BASOS ABS: 0.1 10*3/uL (ref 0.0–0.1)
Basophils Relative: 1 %
EOS PCT: 1 %
Eosinophils Absolute: 0.1 10*3/uL (ref 0.0–0.5)
HCT: 42.4 % (ref 34.8–46.6)
HEMOGLOBIN: 13.8 g/dL (ref 11.6–15.9)
LYMPHS PCT: 15 %
Lymphs Abs: 1.6 10*3/uL (ref 0.9–3.3)
MCH: 32.1 pg (ref 25.1–34.0)
MCHC: 32.5 g/dL (ref 31.5–36.0)
MCV: 98.6 fL (ref 79.5–101.0)
Monocytes Absolute: 0.5 10*3/uL (ref 0.1–0.9)
Monocytes Relative: 4 %
NEUTROS ABS: 8.4 10*3/uL — AB (ref 1.5–6.5)
NEUTROS PCT: 79 %
PLATELETS: 255 10*3/uL (ref 145–400)
RBC: 4.3 MIL/uL (ref 3.70–5.45)
RDW: 14.7 % — ABNORMAL HIGH (ref 11.2–14.5)
WBC: 10.7 10*3/uL — AB (ref 3.9–10.3)

## 2017-10-14 LAB — RETICULOCYTES
RBC.: 4.3 MIL/uL (ref 3.70–5.45)
RETIC COUNT ABSOLUTE: 133.3 10*3/uL — AB (ref 33.7–90.7)
RETIC CT PCT: 3.1 % — AB (ref 0.7–2.1)

## 2017-10-14 LAB — CMP (CANCER CENTER ONLY)
ALT: 11 U/L (ref 0–55)
AST: 17 U/L (ref 5–34)
Albumin: 4.1 g/dL (ref 3.5–5.0)
Alkaline Phosphatase: 69 U/L (ref 40–150)
Anion gap: 9 (ref 3–11)
BILIRUBIN TOTAL: 0.5 mg/dL (ref 0.2–1.2)
BUN: 35 mg/dL — ABNORMAL HIGH (ref 7–26)
CO2: 33 mmol/L — ABNORMAL HIGH (ref 22–29)
Calcium: 9.8 mg/dL (ref 8.4–10.4)
Chloride: 101 mmol/L (ref 98–109)
Creatinine: 1 mg/dL (ref 0.60–1.10)
GFR, EST NON AFRICAN AMERICAN: 55 mL/min — AB (ref >60)
Glucose, Bld: 114 mg/dL (ref 70–140)
POTASSIUM: 3.7 mmol/L (ref 3.5–5.1)
Sodium: 143 mmol/L (ref 136–145)
TOTAL PROTEIN: 6.2 g/dL — AB (ref 6.4–8.3)

## 2017-10-14 NOTE — Progress Notes (Signed)
[facility] OFFICE PROGRESS NOTE   Diagnosis: Hemolytic anemia.   INTERVAL HISTORY:   Michelle Wilcox returns as scheduled.  She continues prednisone dose of 15 mg daily.  She feels well.  No new complaint.  Objective:  Vital signs in last 24 hours:  Blood pressure 108/64, pulse 64, temperature 97.8 F (36.6 C), temperature source Oral, resp. rate 18, height 5\' 4"  (1.626 m), weight 189 lb 8 oz (86 kg), SpO2 99 %.    HEENT: No thrush Resp: Clear bilaterally Cardio: Regular rate and rhythm GI: No hepatosplenomegaly Vascular: No leg edema   Lab Results:  Lab Results  Component Value Date   WBC 10.7 (H) 10/14/2017   HGB 12.5 09/02/2017   HCT 42.4 10/14/2017   MCV 98.6 10/14/2017   PLT 255 10/14/2017   NEUTROABS 8.4 (H) 10/14/2017    CMP     Component Value Date/Time   NA 143 10/14/2017 0914   NA 142 08/12/2017 1254   K 3.7 10/14/2017 0914   K 4.2 08/12/2017 1254   CL 101 10/14/2017 0914   CO2 33 (H) 10/14/2017 0914   CO2 27 08/12/2017 1254   GLUCOSE 114 10/14/2017 0914   GLUCOSE 156 (H) 08/12/2017 1254   BUN 35 (H) 10/14/2017 0914   BUN 29.4 (H) 08/12/2017 1254   CREATININE 1.00 10/14/2017 0914   CREATININE 1.1 08/12/2017 1254   CALCIUM 9.8 10/14/2017 0914   CALCIUM 9.3 08/12/2017 1254   PROT 6.2 (L) 10/14/2017 0914   PROT 6.6 08/12/2017 1254   ALBUMIN 4.1 10/14/2017 0914   ALBUMIN 4.6 08/12/2017 1254   AST 17 10/14/2017 0914   AST 18 08/12/2017 1254   ALT 11 10/14/2017 0914   ALT 11 08/12/2017 1254   ALKPHOS 69 10/14/2017 0914   ALKPHOS 87 08/12/2017 1254   BILITOT 0.5 10/14/2017 0914   BILITOT 1.24 (H) 08/12/2017 1254   GFRNONAA 55 (L) 10/14/2017 0914   GFRAA >60 10/14/2017 0914     Medications: I have reviewed the patient's current medications.   Assessment/Plan: 1. 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, DAT IgG positive  Prednisone started 03/20/2016  Tapered to 15 mg daily on  04/30/2016  Hemoglobin lower, elevated LDH 05/21/2016-prednisone increased to 30 mgdaily  Prednisone taper to 25 mg daily 06/18/2016  Prednisone taper to 20 mg daily 07/16/2016  Prednisone taper to 15 mg daily 08/27/2016  Prednisone taper to 12.5 mg daily 09/25/2016  Hemoglobin lower, prednisone increased to 20 mgdaily on 10/11/2016  Cycle 1 rituximab 11/19/2016  Cycle 2 rituximab 11/26/2016  Cycle 3 rituximab 12/03/2016  Cycle 4 rituximab 12/28/2016  Prednisone taper to 15 mg daily 12/28/2016  Prednisone taper to 12.5 mg daily 01/28/2017  Prednisone taper to 10 mg daily 02/19/2017  Prednisone taper to 7.5 mg daily 04/01/2017  Prednisone taper to 5 mg daily 05/20/2017  Admission with progressive anemia and evidence of hemolysis 08/03/2017  Prednisone resumed at a dose of 60 mg daily  Prednisone taper to 40 mg 08/12/2017  Prednisone taper to 30 mg daily 08/24/2017  Prednisone taper to 20 mg daily 09/03/2017  Prednisone taper to 15 mg daily 09/20/2017  Prednisone taper to 12.5 mg daily 10/14/2017  2. History ofExertional dyspnea secondary to #1  3. Mild splenomegaly on CT 03/16/2016  4. History of polymyalgia rheumatica  5. History of giant cell/temporal arteritis-maintained on methotrexate in the remote past, now on low dose prednisone  6.Admission with leg edema 08/03/2017-diastolic heart failure?, Exacerbated by anemia  Disposition: Michelle Wilcox appears unchanged.  The hemoglobin remains in the normal range.  We tapered the prednisone to 12.5 mg daily.  She will return for a CBC in 3 weeks and an office visit in 6 weeks.  15 minutes were spent with the patient today.  The majority of the time was used for counseling and coordination of care.  Michelle Coder, MD  10/14/2017  9:59 AM

## 2017-10-14 NOTE — Telephone Encounter (Signed)
Appointments scheduled AVS/Calendar printed per 2/25 LOS

## 2017-10-15 ENCOUNTER — Other Ambulatory Visit: Payer: Self-pay

## 2017-10-15 MED ORDER — PREDNISONE 2.5 MG PO TABS: 2.5000 mg | ORAL_TABLET | Freq: Every day | ORAL | 2 refills | Status: DC

## 2017-10-15 MED ORDER — PREDNISONE 2.5 MG PO TABS: 2.5000 mg | ORAL_TABLET | Freq: Every day | ORAL | 0 refills | Status: DC

## 2017-10-15 NOTE — Addendum Note (Signed)
Addended by: Sherril Croon on: 10/15/2017 09:42 AM   Modules accepted: Orders

## 2017-10-28 DIAGNOSIS — M315 Giant cell arteritis with polymyalgia rheumatica: Secondary | ICD-10-CM | POA: Diagnosis not present

## 2017-10-28 DIAGNOSIS — D591 Other autoimmune hemolytic anemias: Secondary | ICD-10-CM | POA: Diagnosis not present

## 2017-10-28 DIAGNOSIS — Z6832 Body mass index (BMI) 32.0-32.9, adult: Secondary | ICD-10-CM | POA: Diagnosis not present

## 2017-10-28 DIAGNOSIS — E669 Obesity, unspecified: Secondary | ICD-10-CM | POA: Diagnosis not present

## 2017-10-28 DIAGNOSIS — M2550 Pain in unspecified joint: Secondary | ICD-10-CM | POA: Diagnosis not present

## 2017-10-28 DIAGNOSIS — M150 Primary generalized (osteo)arthritis: Secondary | ICD-10-CM | POA: Diagnosis not present

## 2017-10-28 DIAGNOSIS — M353 Polymyalgia rheumatica: Secondary | ICD-10-CM | POA: Diagnosis not present

## 2017-11-04 ENCOUNTER — Inpatient Hospital Stay: Payer: Medicare Other | Attending: Nurse Practitioner

## 2017-11-04 DIAGNOSIS — D591 Autoimmune hemolytic anemia, unspecified: Secondary | ICD-10-CM

## 2017-11-04 LAB — CBC WITH DIFFERENTIAL (CANCER CENTER ONLY)
BASOS ABS: 0.1 10*3/uL (ref 0.0–0.1)
BASOS PCT: 1 %
EOS ABS: 0.2 10*3/uL (ref 0.0–0.5)
EOS PCT: 2 %
HCT: 40.2 % (ref 34.8–46.6)
Hemoglobin: 12.7 g/dL (ref 11.6–15.9)
Lymphocytes Relative: 16 %
Lymphs Abs: 1.4 10*3/uL (ref 0.9–3.3)
MCH: 31.4 pg (ref 25.1–34.0)
MCHC: 31.6 g/dL (ref 31.5–36.0)
MCV: 99.5 fL (ref 79.5–101.0)
MONO ABS: 0.5 10*3/uL (ref 0.1–0.9)
Monocytes Relative: 5 %
Neutro Abs: 6.8 10*3/uL — ABNORMAL HIGH (ref 1.5–6.5)
Neutrophils Relative %: 76 %
Platelet Count: 237 10*3/uL (ref 145–400)
RBC: 4.04 MIL/uL (ref 3.70–5.45)
RDW: 14.4 % (ref 11.2–14.5)
WBC: 8.8 10*3/uL (ref 3.9–10.3)

## 2017-11-04 LAB — RETICULOCYTES
RBC.: 4.04 MIL/uL (ref 3.70–5.45)
RETIC COUNT ABSOLUTE: 145.4 10*3/uL — AB (ref 33.7–90.7)
RETIC CT PCT: 3.6 % — AB (ref 0.7–2.1)

## 2017-11-05 ENCOUNTER — Telehealth: Payer: Self-pay | Admitting: *Deleted

## 2017-11-05 DIAGNOSIS — D591 Autoimmune hemolytic anemia, unspecified: Secondary | ICD-10-CM

## 2017-11-05 MED ORDER — PREDNISONE 10 MG PO TABS: 10.0000 mg | ORAL_TABLET | Freq: Every day | ORAL | 0 refills | Status: DC

## 2017-11-05 NOTE — Telephone Encounter (Signed)
-----   Message from Ladell Pier, MD sent at 11/04/2017  6:49 PM EDT ----- Please call patient, hemoglobin remains normal, taper prednisone to 10 mg daily, follow-up as scheduled

## 2017-11-05 NOTE — Addendum Note (Signed)
Addended by: Rosalio Macadamia C on: 11/05/2017 11:22 AM   Modules accepted: Orders

## 2017-11-05 NOTE — Telephone Encounter (Addendum)
Pt left message to confirm instructions to taper Prednisone to 10mg  daily. She will begin on 3/20. (Had already taken today's dose.)Med list updated.

## 2017-11-05 NOTE — Telephone Encounter (Signed)
Left detailed message for pt to taper prednisone dose to 10 mg daily. Follow up as scheduled. Requested she call office to confirm instructions.

## 2017-11-11 ENCOUNTER — Other Ambulatory Visit: Payer: Self-pay | Admitting: Oncology

## 2017-11-11 DIAGNOSIS — I10 Essential (primary) hypertension: Secondary | ICD-10-CM | POA: Diagnosis not present

## 2017-11-11 DIAGNOSIS — Z23 Encounter for immunization: Secondary | ICD-10-CM | POA: Diagnosis not present

## 2017-11-11 DIAGNOSIS — M353 Polymyalgia rheumatica: Secondary | ICD-10-CM | POA: Diagnosis not present

## 2017-11-11 DIAGNOSIS — D591 Other autoimmune hemolytic anemias: Secondary | ICD-10-CM | POA: Diagnosis not present

## 2017-11-11 DIAGNOSIS — F419 Anxiety disorder, unspecified: Secondary | ICD-10-CM | POA: Diagnosis not present

## 2017-11-11 DIAGNOSIS — I5032 Chronic diastolic (congestive) heart failure: Secondary | ICD-10-CM | POA: Diagnosis not present

## 2017-11-11 DIAGNOSIS — J301 Allergic rhinitis due to pollen: Secondary | ICD-10-CM | POA: Diagnosis not present

## 2017-11-25 ENCOUNTER — Telehealth: Payer: Self-pay

## 2017-11-25 ENCOUNTER — Inpatient Hospital Stay (HOSPITAL_BASED_OUTPATIENT_CLINIC_OR_DEPARTMENT_OTHER): Payer: Medicare Other | Admitting: Oncology

## 2017-11-25 ENCOUNTER — Inpatient Hospital Stay: Payer: Medicare Other | Attending: Nurse Practitioner

## 2017-11-25 VITALS — BP 129/71 | HR 74 | Temp 98.4°F | Resp 16 | Ht 64.0 in | Wt 191.3 lb

## 2017-11-25 DIAGNOSIS — R161 Splenomegaly, not elsewhere classified: Secondary | ICD-10-CM | POA: Insufficient documentation

## 2017-11-25 DIAGNOSIS — D591 Autoimmune hemolytic anemia, unspecified: Secondary | ICD-10-CM

## 2017-11-25 DIAGNOSIS — I509 Heart failure, unspecified: Secondary | ICD-10-CM | POA: Insufficient documentation

## 2017-11-25 DIAGNOSIS — M353 Polymyalgia rheumatica: Secondary | ICD-10-CM | POA: Diagnosis not present

## 2017-11-25 DIAGNOSIS — Z79899 Other long term (current) drug therapy: Secondary | ICD-10-CM | POA: Insufficient documentation

## 2017-11-25 LAB — CMP (CANCER CENTER ONLY)
ALK PHOS: 67 U/L (ref 40–150)
ALT: 12 U/L (ref 0–55)
ANION GAP: 7 (ref 3–11)
AST: 24 U/L (ref 5–34)
Albumin: 3.8 g/dL (ref 3.5–5.0)
BUN: 22 mg/dL (ref 7–26)
CALCIUM: 9.4 mg/dL (ref 8.4–10.4)
CO2: 30 mmol/L — AB (ref 22–29)
Chloride: 104 mmol/L (ref 98–109)
Creatinine: 0.87 mg/dL (ref 0.60–1.10)
GFR, Est AFR Am: >60 mL/min (ref >60)
GFR, Estimated: >60 mL/min (ref >60)
GLUCOSE: 95 mg/dL (ref 70–140)
Potassium: 4.4 mmol/L (ref 3.5–5.1)
SODIUM: 141 mmol/L (ref 136–145)
Total Bilirubin: 0.7 mg/dL (ref 0.2–1.2)
Total Protein: 5.9 g/dL — ABNORMAL LOW (ref 6.4–8.3)

## 2017-11-25 LAB — CBC WITH DIFFERENTIAL (CANCER CENTER ONLY)
Basophils Absolute: 0.1 10*3/uL (ref 0.0–0.1)
Basophils Relative: 1 %
EOS ABS: 0.1 10*3/uL (ref 0.0–0.5)
Eosinophils Relative: 1 %
HCT: 36 % (ref 34.8–46.6)
HEMOGLOBIN: 11.8 g/dL (ref 11.6–15.9)
Lymphocytes Relative: 5 %
Lymphs Abs: 0.5 10*3/uL — ABNORMAL LOW (ref 0.9–3.3)
MCH: 31.6 pg (ref 25.1–34.0)
MCHC: 32.8 g/dL (ref 31.5–36.0)
MCV: 96.1 fL (ref 79.5–101.0)
Monocytes Absolute: 0.3 10*3/uL (ref 0.1–0.9)
Monocytes Relative: 3 %
NEUTROS PCT: 90 %
Neutro Abs: 9.5 10*3/uL — ABNORMAL HIGH (ref 1.5–6.5)
Platelet Count: 259 10*3/uL (ref 145–400)
RBC: 3.75 MIL/uL (ref 3.70–5.45)
RDW: 14.2 % (ref 11.2–14.5)
WBC: 10.6 10*3/uL — AB (ref 3.9–10.3)

## 2017-11-25 NOTE — Progress Notes (Signed)
[facility] OFFICE PROGRESS NOTE   Diagnosis: Hemolytic anemia  INTERVAL HISTORY:   Michelle Wilcox returns as scheduled.  The prednisone was tapered to 10 mg daily beginning 11/05/2017.  She feels well.  She has noted increased arthritis discomfort while the prednisone is being tapered.  Objective:  Vital signs in last 24 hours:  Blood pressure 129/71, pulse 74, temperature 98.4 F (36.9 C), temperature source Oral, resp. rate 16, height 5\' 4"  (1.626 m), weight 191 lb 4.8 oz (86.8 kg), SpO2 99 %.    HEENT: No thrush Resp: Lungs clear bilaterally Cardio: Regular rate and rhythm GI: No hepatosplenomegaly Vascular: No leg edema   Lab Results:  Hemoglobin 11.8, platelets 259,000, white count 10.6, ANC 9.5 CMP     Component Value Date/Time   NA 143 10/14/2017 0914   NA 142 08/12/2017 1254   K 3.7 10/14/2017 0914   K 4.2 08/12/2017 1254   CL 101 10/14/2017 0914   CO2 33 (H) 10/14/2017 0914   CO2 27 08/12/2017 1254   GLUCOSE 114 10/14/2017 0914   GLUCOSE 156 (H) 08/12/2017 1254   BUN 35 (H) 10/14/2017 0914   BUN 29.4 (H) 08/12/2017 1254   CREATININE 1.00 10/14/2017 0914   CREATININE 1.1 08/12/2017 1254   CALCIUM 9.8 10/14/2017 0914   CALCIUM 9.3 08/12/2017 1254   PROT 6.2 (L) 10/14/2017 0914   PROT 6.6 08/12/2017 1254   ALBUMIN 4.1 10/14/2017 0914   ALBUMIN 4.6 08/12/2017 1254   AST 17 10/14/2017 0914   AST 18 08/12/2017 1254   ALT 11 10/14/2017 0914   ALT 11 08/12/2017 1254   ALKPHOS 69 10/14/2017 0914   ALKPHOS 87 08/12/2017 1254   BILITOT 0.5 10/14/2017 0914   BILITOT 1.24 (H) 08/12/2017 1254   GFRNONAA 55 (L) 10/14/2017 0914   GFRAA >60 10/14/2017 0914    Medications: I have reviewed the patient's current medications.   Assessment/Plan: 1. 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, DAT IgG positive  Prednisone started 03/20/2016  Tapered to 15 mg daily on 04/30/2016  Hemoglobin lower, elevated LDH  05/21/2016-prednisone increased to 30 mgdaily  Prednisone taper to 25 mg daily 06/18/2016  Prednisone taper to 20 mg daily 07/16/2016  Prednisone taper to 15 mg daily 08/27/2016  Prednisone taper to 12.5 mg daily 09/25/2016  Hemoglobin lower, prednisone increased to 20 mgdaily on 10/11/2016  Cycle 1 rituximab 11/19/2016  Cycle 2 rituximab 11/26/2016  Cycle 3 rituximab 12/03/2016  Cycle 4 rituximab 12/28/2016  Prednisone taper to 15 mg daily 12/28/2016  Prednisone taper to 12.5 mg daily 01/28/2017  Prednisone taper to 10 mg daily 02/19/2017  Prednisone taper to 7.5 mg daily 04/01/2017  Prednisone taper to 5 mg daily 05/20/2017  Admission with progressive anemia and evidence of hemolysis 08/03/2017  Prednisone resumed at a dose of 60 mg daily  Prednisone taper to 40 mg 08/12/2017  Prednisone taper to 30 mg daily 08/24/2017  Prednisone taper to 20 mg daily 09/03/2017  Prednisone taper to 15 mg daily 09/20/2017  Prednisone taper to 12.5 mg daily 10/14/2017  Pednisone taper to 10 mg daily 11/05/2017  2. History ofExertional dyspnea secondary to #1  3. Mild splenomegaly on CT 03/16/2016  4. History of polymyalgia rheumatica  5. History of giant cell/temporal arteritis-maintained on methotrexate in the remote past, now on low dose prednisone  6.Admission with leg edema 08/03/2017-diastolic heart failure?, Exacerbated by anemia   Disposition: She appears well.  The hemoglobin is lower over the past 6  weeks, but remains in the low normal range.  She will continue prednisone at a dose of 10 mg daily.  Ms. Jeschke will contact us for symptoms of anemia.  She will return for a CBC in 3 weeks and an office visit in 6 weeks.  15 minutes were spent with the patient today.  The majority of the time was used for counseling and coordination of care.  Betsy Coder, MD  11/25/2017  12:08 PM

## 2017-11-25 NOTE — Telephone Encounter (Signed)
Printed avs and calender of upcoming appointment. Per 4/8 los 

## 2017-11-26 ENCOUNTER — Telehealth: Payer: Self-pay

## 2017-11-26 NOTE — Telephone Encounter (Addendum)
Pt voiced understanding of message below   ----- Message from Ladell Pier, MD sent at 11/25/2017 12:44 PM EDT ----- Please call patient, potassium is normal, continue potassium chloride, follow-up as scheduled

## 2017-12-16 ENCOUNTER — Telehealth: Payer: Self-pay | Admitting: *Deleted

## 2017-12-16 ENCOUNTER — Inpatient Hospital Stay: Payer: Medicare Other

## 2017-12-16 DIAGNOSIS — R161 Splenomegaly, not elsewhere classified: Secondary | ICD-10-CM | POA: Diagnosis not present

## 2017-12-16 DIAGNOSIS — Z79899 Other long term (current) drug therapy: Secondary | ICD-10-CM | POA: Diagnosis not present

## 2017-12-16 DIAGNOSIS — D591 Autoimmune hemolytic anemia, unspecified: Secondary | ICD-10-CM

## 2017-12-16 DIAGNOSIS — I509 Heart failure, unspecified: Secondary | ICD-10-CM | POA: Diagnosis not present

## 2017-12-16 DIAGNOSIS — M353 Polymyalgia rheumatica: Secondary | ICD-10-CM | POA: Diagnosis not present

## 2017-12-16 LAB — CBC WITH DIFFERENTIAL (CANCER CENTER ONLY)
Basophils Absolute: 0.1 10*3/uL (ref 0.0–0.1)
Basophils Relative: 1 %
EOS PCT: 1 %
Eosinophils Absolute: 0.1 10*3/uL (ref 0.0–0.5)
HEMATOCRIT: 34.5 % — AB (ref 34.8–46.6)
Hemoglobin: 10.6 g/dL — ABNORMAL LOW (ref 11.6–15.9)
LYMPHS PCT: 6 %
Lymphs Abs: 0.6 10*3/uL — ABNORMAL LOW (ref 0.9–3.3)
MCH: 31.7 pg (ref 25.1–34.0)
MCHC: 30.7 g/dL — AB (ref 31.5–36.0)
MCV: 103.3 fL — AB (ref 79.5–101.0)
MONO ABS: 0.4 10*3/uL (ref 0.1–0.9)
MONOS PCT: 4 %
NEUTROS ABS: 9.8 10*3/uL — AB (ref 1.5–6.5)
Neutrophils Relative %: 88 %
PLATELETS: 261 10*3/uL (ref 145–400)
RBC: 3.34 MIL/uL — ABNORMAL LOW (ref 3.70–5.45)
RDW: 15.2 % — AB (ref 11.2–14.5)
WBC Count: 11 10*3/uL — ABNORMAL HIGH (ref 3.9–10.3)

## 2017-12-16 LAB — RETICULOCYTES
RBC.: 3.34 MIL/uL — ABNORMAL LOW (ref 3.70–5.45)
RETIC COUNT ABSOLUTE: 240.5 10*3/uL — AB (ref 33.7–90.7)
Retic Ct Pct: 7.2 % — ABNORMAL HIGH (ref 0.7–2.1)

## 2017-12-16 NOTE — Telephone Encounter (Signed)
Left detailed message for pt to increase prednisone dose to 20mg . Requested she call office to discuss lab result and confirm dose instructions.

## 2017-12-16 NOTE — Telephone Encounter (Signed)
-----   Message from Michelle Pier, MD sent at 12/16/2017  1:36 PM EDT ----- Please call patient, the hemoglobin is lower, increase the prednisone dose to 20 mg daily, call for symptoms of anemia, follow-up as scheduled

## 2017-12-16 NOTE — Telephone Encounter (Signed)
It appears the rituximab has not worked, next step is a splenectomy We will consider high-dose Decadron and other immunosuppressive therapies (Cytoxan, Imuran) if a splenectomy is not successful

## 2017-12-16 NOTE — Telephone Encounter (Signed)
Pt returned call, she voiced understanding of Prednisone instructions. She has enough at this time to take 20 mg daily. Pt asks if this means she will need splenectomy. She also inquired whether Rituxan infusion is still an option.

## 2017-12-17 NOTE — Telephone Encounter (Signed)
Called pt with MD explanation below. She voiced understanding. Informed her providers will discuss all in detail at next visit.

## 2017-12-31 ENCOUNTER — Other Ambulatory Visit: Payer: Self-pay | Admitting: Nurse Practitioner

## 2017-12-31 DIAGNOSIS — E876 Hypokalemia: Secondary | ICD-10-CM

## 2018-01-06 ENCOUNTER — Inpatient Hospital Stay: Payer: Medicare Other | Attending: Nurse Practitioner | Admitting: Nurse Practitioner

## 2018-01-06 ENCOUNTER — Encounter: Payer: Self-pay | Admitting: Nurse Practitioner

## 2018-01-06 ENCOUNTER — Inpatient Hospital Stay: Payer: Medicare Other

## 2018-01-06 VITALS — BP 108/56 | HR 68 | Temp 98.2°F | Resp 17 | Ht 64.0 in | Wt 189.9 lb

## 2018-01-06 DIAGNOSIS — D591 Autoimmune hemolytic anemia, unspecified: Secondary | ICD-10-CM

## 2018-01-06 DIAGNOSIS — Z7952 Long term (current) use of systemic steroids: Secondary | ICD-10-CM

## 2018-01-06 LAB — CBC WITH DIFFERENTIAL (CANCER CENTER ONLY)
BASOS ABS: 0.1 10*3/uL (ref 0.0–0.1)
Basophils Relative: 1 %
Eosinophils Absolute: 0 10*3/uL (ref 0.0–0.5)
Eosinophils Relative: 0 %
HCT: 36.8 % (ref 34.8–46.6)
HEMOGLOBIN: 12 g/dL (ref 11.6–15.9)
LYMPHS PCT: 4 %
Lymphs Abs: 0.4 10*3/uL — ABNORMAL LOW (ref 0.9–3.3)
MCH: 32.2 pg (ref 25.1–34.0)
MCHC: 32.6 g/dL (ref 31.5–36.0)
MCV: 98.9 fL (ref 79.5–101.0)
MONO ABS: 0.1 10*3/uL (ref 0.1–0.9)
MONOS PCT: 1 %
NEUTROS ABS: 9.7 10*3/uL — AB (ref 1.5–6.5)
NEUTROS PCT: 94 %
Platelet Count: 264 10*3/uL (ref 145–400)
RBC: 3.72 MIL/uL (ref 3.70–5.45)
RDW: 16 % — AB (ref 11.2–14.5)
WBC Count: 10.4 10*3/uL — ABNORMAL HIGH (ref 3.9–10.3)

## 2018-01-06 LAB — CMP (CANCER CENTER ONLY)
ALBUMIN: 4.3 g/dL (ref 3.5–5.0)
ALK PHOS: 84 U/L (ref 40–150)
ALT: 36 U/L (ref 0–55)
ANION GAP: 7 (ref 3–11)
AST: 39 U/L — ABNORMAL HIGH (ref 5–34)
BUN: 28 mg/dL — ABNORMAL HIGH (ref 7–26)
CO2: 32 mmol/L — AB (ref 22–29)
Calcium: 9.3 mg/dL (ref 8.4–10.4)
Chloride: 101 mmol/L (ref 98–109)
Creatinine: 0.95 mg/dL (ref 0.60–1.10)
GFR, Est AFR Am: >60 mL/min (ref >60)
GFR, Estimated: 58 mL/min — ABNORMAL LOW (ref >60)
GLUCOSE: 127 mg/dL (ref 70–140)
Potassium: 4.4 mmol/L (ref 3.5–5.1)
SODIUM: 140 mmol/L (ref 136–145)
TOTAL PROTEIN: 6.5 g/dL (ref 6.4–8.3)
Total Bilirubin: 0.9 mg/dL (ref 0.2–1.2)

## 2018-01-06 NOTE — Progress Notes (Addendum)
[facility] OFFICE PROGRESS NOTE   Diagnosis: Hemolytic anemia  INTERVAL HISTORY:   Ms. Knight returns as scheduled.  She had progressive anemia on labs 12/16/2017.  Prednisone was increased to 20 mg daily.  She notes she is feeling better.  Specifically her energy level.  She denies insomnia.  No shortness of breath except with "high humidity".  Objective:  Vital signs in last 24 hours:  Blood pressure (!) 108/56, pulse 68, temperature 98.2 F (36.8 C), temperature source Oral, resp. rate 17, height 5\' 4"  (1.626 m), weight 189 lb 14.4 oz (86.1 kg), SpO2 96 %.    HEENT: No thrush or ulcers.   Resp: Lungs clear bilaterally. Cardio: Regular rate and rhythm. GI: Abdomen soft and nontender.  No hepatosplenomegaly. Vascular: No leg edema.   Lab Results:  Lab Results  Component Value Date   WBC 10.4 (H) 01/06/2018   HGB 12.0 01/06/2018   HCT 36.8 01/06/2018   MCV 98.9 01/06/2018   PLT 264 01/06/2018   NEUTROABS 9.7 (H) 01/06/2018    Imaging:  No results found.  Medications: I have reviewed the patient's current medications.  Assessment/Plan: 1. 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5 mg daily 01/28/2017  Prednisone taper to 10 mg daily 02/19/2017  Prednisone taper to 7.5 mg daily 04/01/2017  Prednisone  taper to 5 mg daily 05/20/2017  Admission with progressive anemia and evidence of hemolysis 08/03/2017  Prednisone resumed at a dose of 60 mg daily  Prednisone taper to 40 mg 08/12/2017  Prednisone taper to 30 mg daily 08/24/2017  Prednisone taper to 20 mg daily 09/03/2017  Prednisone taper to 15 mg daily 09/20/2017  Prednisone taper to 12.5 mg daily 10/14/2017  Pednisone taper to 10 mg daily 11/05/2017  Progressive anemia 12/16/2017  Prednisone increased to 20 mg daily 12/16/2017  Prednisone decreased to 15 mg daily 01/06/2018  2. History ofExertional dyspnea secondary to #1  3. Mild splenomegaly on CT 03/16/2016  4. History of polymyalgia rheumatica  5. History of giant cell/temporal arteritis-maintained on methotrexate in the remote past, now on low dose prednisone  6.Admission with leg edema 08/03/2017-diastolic heart failure?, Exacerbated by anemia     Disposition: Ms. Kneisley appears stable.  She was noted to have progressive anemia 12/16/2017.  Prednisone was increased from 10 mg daily to 20 mg daily.  The hemoglobin has corrected into normal range.  She will decrease prednisone to 15 mg daily and return for a follow-up CBC in 3 weeks.    We discussed options to treat the hemolytic anemia including other immunosuppressive agents, splenectomy.  Dr. Benay Spice recommends a referral to surgery to consider splenectomy.  She agrees with this plan.  Due to her history of heart failure we will also make a referral to cardiology.  She will return for a follow-up visit in 6 weeks.  She will contact the office in the interim with any  problems.  Patient seen with Dr. Benay Spice.    Ned Card ANP/GNP-BC   01/06/2018  11:07 AM This was a shared visit with Ned Card.  Ms. Mccurdy developed progressive hemolytic anemia while on a prednisone taper.  The anemia has again responded to a higher dose of prednisone.  We discussed treatment options with Ms. Reimers.  I  recommend proceeding with a splenectomy since we have been unable to do the prednisone.  We discussed the risk of long-term prednisone use.  She would like to consult with a cardiologist prior to considering a splenectomy.  We will make a referral to Dr. Excell Seltzer to consider a therapeutic splenectomy.  We discussed alternate treatment strategies including a trial of azathioprine or cyclophosphamide.  [name], MD

## 2018-01-08 ENCOUNTER — Telehealth: Payer: Self-pay

## 2018-01-08 NOTE — Telephone Encounter (Signed)
Faxed and loaded patient referral. Per 5/20 los. (Was incomplete due to information how ever Lattie Haw has redone the referral and now has been sent.

## 2018-01-10 ENCOUNTER — Telehealth: Payer: Self-pay | Admitting: *Deleted

## 2018-01-10 NOTE — Telephone Encounter (Signed)
FYI "Have not heard from any of the doctors for appointments.  I need the names of cardiologist and surgeon to call to schedule appointment.  Need to let them know I can only be seen on Monday's.  Have CHF, need to see a cardiologist to determine if it's safe to have spleen removed.  It is okay to leave a message on voicemail because I cannot answer my cell phone calls at work."  Referral information shared with patient along with information that [facility] will send records for provider review before appointments scheduled.  These offices will call with appointment information.

## 2018-01-15 ENCOUNTER — Telehealth: Payer: Self-pay | Admitting: *Deleted

## 2018-01-15 NOTE — Telephone Encounter (Signed)
"  Calling to determine if dr. Benay Spice thinks Dr. Thompson Grayer is a good match for me since Dr. Burt Knack is not accepting new patients.  A friend shared with me he experience with Dr. Thompson Grayer.  A second choice is Dr. Crissie Sickles who works with CHF.  I can be reached at work [phone] ext. [phone].  I cannot answer my cell number at work so leave a Advertising account executive.

## 2018-01-15 NOTE — Telephone Encounter (Signed)
I think Dr. Rayann Heman will be great

## 2018-01-16 ENCOUNTER — Telehealth: Payer: Self-pay | Admitting: *Deleted

## 2018-01-16 NOTE — Telephone Encounter (Signed)
Pt called back and informed nurse that she has appt with Dr. Glenetta Hew, cardiologist at [facility]  On  03/05/18. Stated this is the earliest appt pt could have.  Pt was offered an appt with PA sooner, but pt declined.  Per pt, she would like to see a cardiologist to evaluate whether pt could have surgery or not.    Pt wanted to know if Dr. Benay Spice could have another provider who would see pt sooner.

## 2018-01-16 NOTE — Telephone Encounter (Signed)
Received call from pt stating that she called & was told that Dr Lovena Le & Dr Rayann Heman do not specialize in CHF & they are more intervention cardiologist.  They recommended the CHF clinic at [facility].  She wants to know what Dr Benay Spice suggest.  Message routed to Dr Sherrill/Pod RN.  She may be reached at work [phone] or leave message on her mobile #.

## 2018-01-16 NOTE — Telephone Encounter (Signed)
Spoke to patient in regards to cardiology appt. She will take the July 17th appt. She only wants to see a provider that specializes in CHF. She does not have a regular cardiologist. She requested to be placed on the cancellation list for any sooner openings. Provider has been updated.

## 2018-01-20 ENCOUNTER — Inpatient Hospital Stay: Payer: Medicare Other | Attending: Nurse Practitioner

## 2018-01-20 ENCOUNTER — Telehealth: Payer: Self-pay

## 2018-01-20 DIAGNOSIS — D591 Autoimmune hemolytic anemia, unspecified: Secondary | ICD-10-CM

## 2018-01-20 LAB — RETICULOCYTES
RBC.: 3.65 MIL/uL — ABNORMAL LOW (ref 3.70–5.45)
RETIC COUNT ABSOLUTE: 164.3 10*3/uL — AB (ref 33.7–90.7)
Retic Ct Pct: 4.5 % — ABNORMAL HIGH (ref 0.7–2.1)

## 2018-01-20 LAB — CBC WITH DIFFERENTIAL (CANCER CENTER ONLY)
BASOS ABS: 0 10*3/uL (ref 0.0–0.1)
Basophils Relative: 0 %
Eosinophils Absolute: 0 10*3/uL (ref 0.0–0.5)
Eosinophils Relative: 0 %
HEMATOCRIT: 37.3 % (ref 34.8–46.6)
Hemoglobin: 11.5 g/dL — ABNORMAL LOW (ref 11.6–15.9)
LYMPHS ABS: 0.4 10*3/uL — AB (ref 0.9–3.3)
LYMPHS PCT: 4 %
MCH: 31.5 pg (ref 25.1–34.0)
MCHC: 30.8 g/dL — AB (ref 31.5–36.0)
MCV: 102.2 fL — AB (ref 79.5–101.0)
MONOS PCT: 2 %
Monocytes Absolute: 0.2 10*3/uL (ref 0.1–0.9)
NEUTROS ABS: 10.3 10*3/uL — AB (ref 1.5–6.5)
Neutrophils Relative %: 94 %
Platelet Count: 243 10*3/uL (ref 145–400)
RBC: 3.65 MIL/uL — ABNORMAL LOW (ref 3.70–5.45)
RDW: 14.6 % — ABNORMAL HIGH (ref 11.2–14.5)
WBC Count: 11 10*3/uL — ABNORMAL HIGH (ref 3.9–10.3)

## 2018-01-20 NOTE — Telephone Encounter (Signed)
Pt questioned absolute reticulocyte count. Spoke with pt. Per MD, this indicates that hemolysis is still occurring, but the marrow is still making cells to compensate. Pt voiced understanding. Pt to call clinic back mid or end of week to indicate need to keep lab appt for next Monday.  \

## 2018-01-20 NOTE — Telephone Encounter (Signed)
Call from pt requesting that lab appt be moved from next week to today. Pt reports feeling fatigued and "looking pale". Per MD, ok to come for lab today.  Pt voiced understanding, lab appt added by Maudie Mercury, phlebotomy receptionist.

## 2018-01-24 ENCOUNTER — Other Ambulatory Visit: Payer: Self-pay | Admitting: Emergency Medicine

## 2018-01-24 DIAGNOSIS — D591 Autoimmune hemolytic anemia, unspecified: Secondary | ICD-10-CM

## 2018-01-27 ENCOUNTER — Telehealth: Payer: Self-pay | Admitting: Emergency Medicine

## 2018-01-27 ENCOUNTER — Inpatient Hospital Stay: Payer: Medicare Other

## 2018-01-27 DIAGNOSIS — D591 Autoimmune hemolytic anemia, unspecified: Secondary | ICD-10-CM

## 2018-01-27 LAB — CBC WITH DIFFERENTIAL (CANCER CENTER ONLY)
Basophils Absolute: 0.1 10*3/uL (ref 0.0–0.1)
Basophils Relative: 1 %
EOS PCT: 1 %
Eosinophils Absolute: 0.1 10*3/uL (ref 0.0–0.5)
HEMATOCRIT: 36.2 % (ref 34.8–46.6)
HEMOGLOBIN: 12 g/dL (ref 11.6–15.9)
LYMPHS ABS: 0.8 10*3/uL — AB (ref 0.9–3.3)
LYMPHS PCT: 8 %
MCH: 32.2 pg (ref 25.1–34.0)
MCHC: 33.3 g/dL (ref 31.5–36.0)
MCV: 96.8 fL (ref 79.5–101.0)
Monocytes Absolute: 0.5 10*3/uL (ref 0.1–0.9)
Monocytes Relative: 5 %
Neutro Abs: 8.9 10*3/uL — ABNORMAL HIGH (ref 1.5–6.5)
Neutrophils Relative %: 85 %
PLATELETS: 265 10*3/uL (ref 145–400)
RBC: 3.74 MIL/uL (ref 3.70–5.45)
RDW: 15.3 % — ABNORMAL HIGH (ref 11.2–14.5)
WBC: 10.4 10*3/uL — AB (ref 3.9–10.3)

## 2018-01-27 NOTE — Telephone Encounter (Addendum)
Pt verbalized understanding of this  ----- Message from Ladell Pier, MD sent at 01/27/2018  1:44 PM EDT ----- Please call patient, hemoglobin is stable, continue prednisone-same dose, follow-up as scheduled

## 2018-02-05 DIAGNOSIS — I10 Essential (primary) hypertension: Secondary | ICD-10-CM | POA: Diagnosis not present

## 2018-02-05 DIAGNOSIS — D591 Other autoimmune hemolytic anemias: Secondary | ICD-10-CM | POA: Diagnosis not present

## 2018-02-05 DIAGNOSIS — M899 Disorder of bone, unspecified: Secondary | ICD-10-CM | POA: Diagnosis not present

## 2018-02-05 DIAGNOSIS — E876 Hypokalemia: Secondary | ICD-10-CM | POA: Diagnosis not present

## 2018-02-05 DIAGNOSIS — R509 Fever, unspecified: Secondary | ICD-10-CM | POA: Diagnosis not present

## 2018-02-05 DIAGNOSIS — M353 Polymyalgia rheumatica: Secondary | ICD-10-CM | POA: Diagnosis not present

## 2018-02-05 DIAGNOSIS — M85852 Other specified disorders of bone density and structure, left thigh: Secondary | ICD-10-CM | POA: Diagnosis not present

## 2018-02-05 DIAGNOSIS — I5032 Chronic diastolic (congestive) heart failure: Secondary | ICD-10-CM | POA: Diagnosis not present

## 2018-02-05 DIAGNOSIS — R197 Diarrhea, unspecified: Secondary | ICD-10-CM | POA: Diagnosis not present

## 2018-02-05 DIAGNOSIS — Z800 Family history of malignant neoplasm of digestive organs: Secondary | ICD-10-CM | POA: Diagnosis not present

## 2018-02-10 DIAGNOSIS — J301 Allergic rhinitis due to pollen: Secondary | ICD-10-CM | POA: Diagnosis not present

## 2018-02-10 DIAGNOSIS — I10 Essential (primary) hypertension: Secondary | ICD-10-CM | POA: Diagnosis not present

## 2018-02-10 DIAGNOSIS — Z1211 Encounter for screening for malignant neoplasm of colon: Secondary | ICD-10-CM | POA: Diagnosis not present

## 2018-02-10 DIAGNOSIS — Z0000 Encounter for general adult medical examination without abnormal findings: Secondary | ICD-10-CM | POA: Diagnosis not present

## 2018-02-10 DIAGNOSIS — M85852 Other specified disorders of bone density and structure, left thigh: Secondary | ICD-10-CM | POA: Diagnosis not present

## 2018-02-10 DIAGNOSIS — Z1389 Encounter for screening for other disorder: Secondary | ICD-10-CM | POA: Diagnosis not present

## 2018-02-10 DIAGNOSIS — F419 Anxiety disorder, unspecified: Secondary | ICD-10-CM | POA: Diagnosis not present

## 2018-02-10 DIAGNOSIS — D591 Other autoimmune hemolytic anemias: Secondary | ICD-10-CM | POA: Diagnosis not present

## 2018-02-10 DIAGNOSIS — I5032 Chronic diastolic (congestive) heart failure: Secondary | ICD-10-CM | POA: Diagnosis not present

## 2018-02-10 DIAGNOSIS — E785 Hyperlipidemia, unspecified: Secondary | ICD-10-CM | POA: Diagnosis not present

## 2018-02-17 ENCOUNTER — Telehealth: Payer: Self-pay

## 2018-02-17 ENCOUNTER — Other Ambulatory Visit: Payer: Self-pay | Admitting: Nurse Practitioner

## 2018-02-17 ENCOUNTER — Inpatient Hospital Stay: Payer: Medicare Other

## 2018-02-17 ENCOUNTER — Inpatient Hospital Stay: Payer: Medicare Other | Attending: Nurse Practitioner | Admitting: Oncology

## 2018-02-17 VITALS — BP 108/69 | HR 71 | Temp 98.4°F | Resp 18 | Ht 64.0 in | Wt 191.6 lb

## 2018-02-17 DIAGNOSIS — Z7952 Long term (current) use of systemic steroids: Secondary | ICD-10-CM | POA: Diagnosis not present

## 2018-02-17 DIAGNOSIS — D591 Autoimmune hemolytic anemia, unspecified: Secondary | ICD-10-CM

## 2018-02-17 DIAGNOSIS — R740 Nonspecific elevation of levels of transaminase and lactic acid dehydrogenase [LDH]: Secondary | ICD-10-CM | POA: Diagnosis not present

## 2018-02-17 LAB — CBC WITH DIFFERENTIAL (CANCER CENTER ONLY)
Basophils Absolute: 0.1 10*3/uL (ref 0.0–0.1)
Basophils Relative: 1 %
EOS ABS: 0.2 10*3/uL (ref 0.0–0.5)
Eosinophils Relative: 2 %
HCT: 38.9 % (ref 34.8–46.6)
Hemoglobin: 12.4 g/dL (ref 11.6–15.9)
LYMPHS ABS: 1.6 10*3/uL (ref 0.9–3.3)
Lymphocytes Relative: 16 %
MCH: 31.3 pg (ref 25.1–34.0)
MCHC: 31.9 g/dL (ref 31.5–36.0)
MCV: 98.2 fL (ref 79.5–101.0)
MONO ABS: 0.5 10*3/uL (ref 0.1–0.9)
MONOS PCT: 5 %
NEUTROS ABS: 7.2 10*3/uL — AB (ref 1.5–6.5)
Neutrophils Relative %: 76 %
PLATELETS: 248 10*3/uL (ref 145–400)
RBC: 3.96 MIL/uL (ref 3.70–5.45)
RDW: 14.2 % (ref 11.2–14.5)
WBC Count: 9.5 10*3/uL (ref 3.9–10.3)

## 2018-02-17 LAB — RETICULOCYTES
RBC.: 3.96 MIL/uL (ref 3.70–5.45)
RETIC CT PCT: 4.4 % — AB (ref 0.7–2.1)
Retic Count, Absolute: 174.2 10*3/uL — ABNORMAL HIGH (ref 33.7–90.7)

## 2018-02-17 MED ORDER — PREDNISONE 10 MG PO TABS: 10.0000 mg | ORAL_TABLET | Freq: Every day | ORAL | 0 refills | Status: DC

## 2018-02-17 MED ORDER — FOLIC ACID 1 MG PO TABS: 1.0000 mg | ORAL_TABLET | Freq: Every day | ORAL | 0 refills | Status: DC

## 2018-02-17 NOTE — Progress Notes (Signed)
Michelle Wilcox   Diagnosis: Hemolytic anemia  INTERVAL HISTORY:   Michelle Wilcox returns as scheduled.  She continues prednisone at a dose of 15 mg daily.  She feels well.  She is scheduled to see cardiology and surgery within the next few weeks.  Objective:  Vital signs in last 24 hours:  Blood pressure 108/69, pulse 71, temperature 98.4 F (36.9 C), temperature source Oral, resp. rate 18, height 5\' 4"  (1.626 m), weight 191 lb 9.6 oz (86.9 kg), SpO2 97 %.    HEENT: No thrush Lymphatics: No cervical or supraclavicular nodes Resp: End inspiratory rhonchi and decreased breath sounds at the posterior base bilaterally, no respiratory distress Cardio: Regular rate and rhythm GI: No hepatosplenomegaly Vascular: No leg edema   Lab Results:  Lab Results  Component Value Date   WBC 9.5 02/17/2018   HGB 12.4 02/17/2018   HCT 38.9 02/17/2018   MCV 98.2 02/17/2018   PLT 248 02/17/2018   NEUTROABS 7.2 (H) 02/17/2018    CMP  Lab Results  Component Value Date   NA 140 01/06/2018   K 4.4 01/06/2018   CL 101 01/06/2018   CO2 32 (H) 01/06/2018   GLUCOSE 127 01/06/2018   BUN 28 (H) 01/06/2018   CREATININE 0.95 01/06/2018   CALCIUM 9.3 01/06/2018   PROT 6.5 01/06/2018   ALBUMIN 4.3 01/06/2018   AST 39 (H) 01/06/2018   ALT 36 01/06/2018   ALKPHOS 84 01/06/2018   BILITOT 0.9 01/06/2018   GFRNONAA 58 (L) 01/06/2018   GFRAA >60 01/06/2018    Medications: I have reviewed the patient's current medications.   Assessment/Plan: 1. 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, DAT IgG positive  Prednisone started 03/20/2016  Tapered to 15 mg daily on 04/30/2016  Hemoglobin lower, elevated LDH 05/21/2016-prednisone increased to 30 mgdaily  Prednisone taper to 25 mg daily 06/18/2016  Prednisone taper to 20 mg daily 07/16/2016  Prednisone taper to 15 mg daily 08/27/2016  Prednisone taper to 12.5 mg daily  09/25/2016  Hemoglobin lower, prednisone increased to 20 mgdaily on 10/11/2016  Cycle 1 rituximab 11/19/2016  Cycle 2 rituximab 11/26/2016  Cycle 3 rituximab 12/03/2016  Cycle 4 rituximab 12/28/2016  Prednisone taper to 15 mg daily 12/28/2016  Prednisone taper to 12.5 mg daily 01/28/2017  Prednisone taper to 10 mg daily 02/19/2017  Prednisone taper to 7.5 mg daily 04/01/2017  Prednisone taper to 5 mg daily 05/20/2017  Admission with progressive anemia and evidence of hemolysis 08/03/2017  Prednisone resumed at a dose of 60 mg daily  Prednisone taper to 40 mg 08/12/2017  Prednisone taper to 30 mg daily 08/24/2017  Prednisone taper to 20 mg daily 09/03/2017  Prednisone taper to 15 mg daily 09/20/2017  Prednisone taper to 12.5 mg daily 10/14/2017  Pednisone taper to 10 mg daily 11/05/2017  Progressive anemia 12/16/2017  Prednisone increased to 20 mg daily 12/16/2017  Prednisone decreased to 15 mg daily 01/06/2018  Prednisone decreased to 12.5 mg daily 02/17/2018  2. History ofExertional dyspnea secondary to #1  3. Mild splenomegaly on CT 03/16/2016  4. History of polymyalgia rheumatica  5. History of giant cell/temporal arteritis-maintained on methotrexate in the remote past, now on low dose prednisone  6.Admission with leg edema 08/03/2017-diastolic heart failure?, Exacerbated by anemia   Disposition: Michelle Wilcox appears stable.  The hemoglobin is in the normal range today.  We tapered the prednisone to 12.5 mg daily.  She is scheduled for a cardiology evaluation within  the next 2 weeks.  She will see Dr. Excell Seltzer for a preoperative evaluation in case she needs a splenectomy.  We will continue a slow prednisone taper.  If we cannot taper the prednisone to less than 10 mg daily or less the plan is to refer her for a splenectomy.  She will return for an office visit and CBC in approximately 3 weeks.  Michelle Coder, MD  02/17/2018  11:12 AM

## 2018-02-17 NOTE — Telephone Encounter (Signed)
Printed avs and calender of upcoming appointment. Per 7/1 los

## 2018-03-05 ENCOUNTER — Encounter: Payer: Self-pay | Admitting: Cardiology

## 2018-03-05 ENCOUNTER — Ambulatory Visit (INDEPENDENT_AMBULATORY_CARE_PROVIDER_SITE_OTHER): Payer: Medicare Other | Admitting: Cardiology

## 2018-03-05 ENCOUNTER — Encounter

## 2018-03-05 VITALS — BP 118/64 | HR 83 | Ht 64.0 in | Wt 190.6 lb

## 2018-03-05 DIAGNOSIS — I10 Essential (primary) hypertension: Secondary | ICD-10-CM

## 2018-03-05 DIAGNOSIS — I5189 Other ill-defined heart diseases: Secondary | ICD-10-CM

## 2018-03-05 DIAGNOSIS — Z01810 Encounter for preprocedural cardiovascular examination: Secondary | ICD-10-CM | POA: Diagnosis not present

## 2018-03-05 DIAGNOSIS — E782 Mixed hyperlipidemia: Secondary | ICD-10-CM

## 2018-03-05 DIAGNOSIS — R0609 Other forms of dyspnea: Secondary | ICD-10-CM | POA: Diagnosis not present

## 2018-03-05 NOTE — Patient Instructions (Addendum)
Medication Instructions: Dr Ellyn Hack recommends that you continue on your current medications as directed. Please refer to the Current Medication list given to you today.  Labwork: NONE ORDERED  Testing/Procedures: 1. [place] Stress test - Your physician has requested that you have a lexiscan myoview. For further information please visit [email or web address]. Please follow instruction sheet, as given.  Follow-up: Dr Ellyn Hack recommends that you schedule a follow-up appointment first available.  If you need a refill on your cardiac medications before your next appointment, please call your pharmacy.  Glenetta Hew, MD

## 2018-03-05 NOTE — Progress Notes (Signed)
PCP: Cari Caraway, MD  Surgeon: Dr. Excell Seltzer Oncology: Darnell Level. Benay Spice, MD  Clinic Note: Chief Complaint  Patient presents with  . New Patient (Initial Visit)    Preoperative evaluation and diagnosis of  "CHF    HPI: Michelle Wilcox is a 72 y.o. female with recent diagnosis of AUTOIMMUNE HEMOLYTIC ANEMIA and prior history of GIANT CELL/TEMPORAL ARTERITIS & PMR as well as hypertension and osteoarthritis who is being seen today for cardiac evaluation in anticipation for possible splenectomy suggested based on hospitalization last year with "heart failure ".  She is being seen today at the request of Cari Caraway, MD & Dr.Gary Augustin Coupe [place] was last seen on February 17, 2018 by Dr. Benay Spice who is plan to refer her to Dr. Excell Seltzer from surgery for probable splenectomy for her autoimmune hemolytic anemia.  Her hemoglobin levels have been stable.  She is now on a tapered dose of prednisone.  Apparently she is been having a hard time keeping her levels steady with any taper in the past.  They are hoping to hold off on splenectomy, but more likely will need to be done.  Recent Hospitalizations:  1. December 15-19, 2018: She presented with lower extremity edema and shortness of breath as well as weakness.  She was diuresed with Lasix upon initial admission.  Apparently she had just been treated for influenza.  At that time she was also in the course of a steroid taper --> recommendation by oncology was to actually start higher dose prednisone burst.  She was given a diagnosis of congestive heart failure however her EF was pretty normal with grade 1 diastolic dysfunction mildly dilated left atrium" mild pulmonary hypertension which is highly unlikely that this is true heart failure.  However she was continued on 40 mg of Lasix.  Studies Personally Reviewed - (if available, images/films reviewed: From Epic Chart or Care Everywhere)  Transthoracic Echo December 2018: EF 60 to 65%  with normal wall motion.  GR 1 DD.  Mild AI.  Mild pulmonary hypertension.  Lower extremity venous Dopplers in December 2018: No DVTs.  Interval History: Elleah actually suggested to her doctors that she come to cardiology for evaluation because of the diagnosis of heart failure.  She does remain on relatively low dose of Lasix and feels better, her breathing is better.  Other than a little bit of edema for which she takes Lasix, she really has not noticed much in the way of any orthopnea or PND.  She tells me that she gets short of breath doing even mile walk, but is really limited by pain in the hips and knees mostly because of comfort and GERD symptoms.  She denies any PND orthopnea.  That limits her walking.  She sleeps in a recliner really --for comfort.  Although she denies any chest pain with exertion, she does have sweating with exertion and thinks her dyspnea is more related to her arthritic pains in the office. She had never been told anything about having heart failure symptoms and was very upset about hearing this in December.  She is now on 50-25 mg of metoprolol alternating.  She is also on her oral Lasix stating it is low she takes it she does not get as much swelling. She denies any lightheadedness or dizziness, syncope/near syncope or TIAs /TIA/amaurosis fugax symptoms.    She denies any claudication symptoms.  ROS: A comprehensive was performed. Review of Systems  Constitutional: Negative for diaphoresis and malaise/fatigue.  Cardiovascular:  Positive for leg swelling (Well-controlled with diuretic and foot elevation).  Gastrointestinal: Positive for nausea.       Frequent loose stools but not real true diarrhea.  Neurological: Positive for dizziness (Positional).  Psychiatric/Behavioral: The patient is nervous/anxious.   All other systems reviewed and are negative.    I have reviewed and (if needed) personally updated the patient's problem list, medications, allergies, past  medical and surgical history, social and family history.   Past Medical History:  Diagnosis Date  . Anxiety   . Depression   . Giant cell arteritis ([place])   . Gouty arthropathy   . Hemolytic anemia ([place])   . Hyperlipidemia   . Hypertension   . Migraines   . Polymyalgia ([place])   . Polymyalgia rheumatica ([place])   . Temporal arteritis [facility])     Past Surgical History:  Procedure Laterality Date  . BARTHOLIN GLAND CYST EXCISION     non ca  . EYE MUSCLE SURGERY     as a child 89 yrs old  . KNEE SURGERY  2004   rt knee  . toncil    . TONSILLECTOMY     as a child    Current Meds  Medication Sig  . acetaminophen (TYLENOL) 650 MG CR tablet Take 1,300 mg by mouth 2 (two) times daily.  Marland Kitchen ALPRAZolam (XANAX) 0.25 MG tablet Take 1 tablet (0.25 mg total) by mouth at bedtime as needed for anxiety. (Patient taking differently: Take 1 tablet (0.25 mg total) by mouth at bedtime as needed for anxiety.)  . cholecalciferol (VITAMIN D) 1000 UNITS tablet Take 2,000 Units by mouth daily.   . folic acid (FOLVITE) 1 MG tablet Take 1 tablet (1 mg total) by mouth daily.  . furosemide (LASIX) 40 MG tablet Take 20-40 mg by mouth daily.  Marland Kitchen loperamide (IMODIUM A-D) 2 MG tablet Take 2 mg by mouth as needed for diarrhea or loose stools (takes 2 if needed).   . loratadine (CLARITIN) 10 MG tablet Take 10 mg by mouth daily.  . metoprolol tartrate (LOPRESSOR) 50 MG tablet Take 1 tablet (50 mg total) by mouth every morning AND take 0.5 tablet (25 mg total) by mouth every evening.  . Multiple Vitamin (MULTIVITAMIN) tablet Take 1 tablet by mouth every evening.   Marland Kitchen omeprazole (PRILOSEC OTC) 20 MG tablet Take 20 mg by mouth every morning.   . potassium chloride SA (K-DUR,KLOR-CON) 20 MEQ tablet Take 10-20 mEq by mouth daily.  . predniSONE (DELTASONE) 10 MG tablet Take 1 tablet (10 mg total) by mouth daily with breakfast.  . predniSONE (DELTASONE) 2.5 MG tablet Take 2.5 mg by mouth daily with breakfast.  . simethicone  (GAS-X) 80 MG chewable tablet Chew 80 mg by mouth every 6 (six) hours as needed for flatulence.   . VENTOLIN HFA 108 (90 Base) MCG/ACT inhaler Take 1-2 puffs by mouth every 4 (four) hours as needed for shortness of breath.     Allergies  Allergen Reactions  . Penicillins Anaphylaxis    Has patient had a PCN reaction causing immediate rash, facial/tongue/throat swelling, SOB or lightheadedness with hypotension: yes Has patient had a PCN reaction causing severe rash involving mucus membranes or skin necrosis: no Has patient had a PCN reaction that required hospitalization: yes Has patient had a PCN reaction occurring within the last 10 years: no If all of the above answers are "NO", then may proceed with Cephalosporin use.   . Ceftin [Cefuroxime Axetil]   . Ciprofloxacin   .  Darvocet [Propoxyphene N-Acetaminophen]   . Levaquin [Levofloxacin In D5w] Hives  . Sulfa Antibiotics Hives  . Zyrtec [Cetirizine Hcl]     headache    Social History   Tobacco Use  . Smoking status: Never Smoker  . Smokeless tobacco: Never Used  Substance Use Topics  . Alcohol use: Yes    Comment: rarely   . Drug use: No   Social History   Social History Narrative  . Not on file    family history includes Arthritis in her mother; Other in her mother; Stroke in her mother. She was adopted.  Wt Readings from Last 3 Encounters:  03/05/18 190 lb 9.6 oz (86.5 kg)  02/17/18 191 lb 9.6 oz (86.9 kg)  01/06/18 189 lb 14.4 oz (86.1 kg)    PHYSICAL EXAM BP 118/64   Pulse 83   Ht 5\' 4"  (1.626 m)   Wt 190 lb 9.6 oz (86.5 kg)   BMI 32.72 kg/m  Physical Exam  Constitutional: She is oriented to person, place, and time. She appears well-developed and well-nourished. No distress.  HENT:  Head: Normocephalic and atraumatic.  Mouth/Throat: No oropharyngeal exudate.  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  Eyes: Conjunctivae and EOM are normal.  Neck: Normal range of motion. Neck supple.    Cardiovascular: Normal rate, regular rhythm, normal heart sounds and intact distal pulses.  No extrasystoles are present. PMI is not displaced. Exam reveals no gallop and no friction rub.  No murmur heard. Pulmonary/Chest: Effort normal and breath sounds normal. No respiratory distress. She has no wheezes. She has no rales. She exhibits no tenderness.  Abdominal: Soft. Bowel sounds are normal. She exhibits no distension. There is no tenderness. There is no rebound.  Musculoskeletal: Normal range of motion. She exhibits no edema.  Neurological: She is alert and oriented to person, place, and time. No cranial nerve deficit. Coordination normal.  Psychiatric: She has a normal mood and affect. Judgment and thought content normal.  Quite nervous, but otherwise normal mood and affect.     Adult ECG Report   Rate: 83 ;  Rhythm: normal sinus rhythm and Nonspecific ST-T wave changes.  Otherwise normal;   Narrative Interpretation: Relatively normal/stable EKG   Other studies Reviewed: Additional studies/ records that were reviewed today include:  Recent Labs:   Lab Results  Component Value Date   CHOL 145 03/13/2016   HDL 55 03/13/2016   LDLCALC 69 03/13/2016   TRIG 103 03/13/2016   CHOLHDL 2.6 03/13/2016   Lab Results  Component Value Date   CREATININE 0.95 01/06/2018   BUN 28 (H) 01/06/2018   NA 140 01/06/2018   K 4.4 01/06/2018   CL 101 01/06/2018   CO2 32 (H) 01/06/2018   Lab Results  Component Value Date   WBC 9.5 02/17/2018   HGB 12.4 02/17/2018   HCT 38.9 02/17/2018   MCV 98.2 02/17/2018   PLT 248 02/17/2018    ASSESSMENT / PLAN: Problem List Items Addressed This Visit    Preop cardiovascular exam - Primary    This is a difficult situation.  She was told she had CHF which was really diastolic dysfunction mediated diastolic heart failure in the setting of her  autoimmune hemolytic anemia.  She is on stable dose of beta-blocker and Lasix without really any recurrence of  those symptoms.  She has normal renal function, no history of CAD or angina.  No diabetes on insulin and no prior stroke. Splenectomy as a potentially intraperitoneal surgery  would be considered "high risk "from a cardiac standpoint.    Unfortunately, she is pretty sedentary and does not really do much activity and will be hard to tell if she actually has any anginal: Symptoms.  I would like to do an ischemic evaluation.  My preference would be a coronary CT angiogram, but the timing of this would potentially delay her plans. As such, we will proceed with Lexiscan Myoview to evaluate for ischemia.        Relevant Orders   EKG 12-Lead (Completed)   Hypertension (Chronic)    Blood pressure well controlled.      Relevant Medications   furosemide (LASIX) 40 MG tablet   metoprolol tartrate (LOPRESSOR) 50 MG tablet   Other Relevant Orders   EKG 12-Lead (Completed)   Hyperlipidemia (Chronic)    LDL look pretty good back in 2017 have not seen it since.  She is actually not on statin and doing fairly well.  Continue continue to monitor.      Relevant Medications   furosemide (LASIX) 40 MG tablet   metoprolol tartrate (LOPRESSOR) 50 MG tablet   Other Relevant Orders   EKG 12-Lead (Completed)   DOE (dyspnea on exertion)    Dyspnea on exertion does not seem to be a chronic thing for her but it has been happening off and on.  Because she is limited from a mobility standpoint, I think it best to exclude CAD/ischemic CAD.  We can perform this with a treadmill Myoview stress test.      Relevant Orders   EKG 12-Lead (Completed)   MYOCARDIAL PERFUSION IMAGING   Diastolic dysfunction without heart failure (Chronic)    Echocardiogram ordered in the setting of heart failure symptoms showed normal EF with only grade 1 diastolic dysfunction and mild pulmonary hypertension.  I do not think this should preclude her from having surgery.  Does not put her at risk, but diastolic dysfunction can be  mediated by ischemia.  We will therefore evaluating for ischemia with a treadmill Myoview. Otherwise continue current dosing of Lasix to avoid volume overload, and beta-blocker.  The key recommendation with this diagnosis is to avoid volume overload perioperatively.  Pending results of Myoview stress test will make further recommendations.      Relevant Orders   EKG 12-Lead (Completed)       I spent a total of 30 minutes with the patient and chart review. >  50% of the time was spent in direct patient consultation.   Current medicines are reviewed at length with the patient today.  (+/- concerns) see below The following changes have been made:  See patient instructions.  Patient Instructions  Medication Instructions: Dr Ellyn Hack recommends that you continue on your current medications as directed. Please refer to the Current Medication list given to you today.  Labwork: NONE ORDERED  Testing/Procedures: 1. [place] Stress test - Your physician has requested that you have a lexiscan myoview. For further information please visit [email or web address]. Please follow instruction sheet, as given.  Follow-up: Dr Ellyn Hack recommends that you schedule a follow-up appointment first available.  If you need a refill on your cardiac medications before your next appointment, please call your pharmacy.  Glenetta Hew, MD     Studies Ordered:   Orders Placed This Encounter  Procedures  . MYOCARDIAL PERFUSION IMAGING  . EKG 12-Lead      Glenetta Hew, M.D., M.S. Interventional Cardiologist   Pager # [phone] Phone # [phone] [address] [place],  [place] [address]   Thank you for choosing Heartcare at [facility]!!

## 2018-03-06 ENCOUNTER — Telehealth (HOSPITAL_COMMUNITY): Payer: Self-pay

## 2018-03-06 DIAGNOSIS — D591 Other autoimmune hemolytic anemias: Secondary | ICD-10-CM | POA: Diagnosis not present

## 2018-03-06 NOTE — Telephone Encounter (Signed)
Encounter complete. 

## 2018-03-07 ENCOUNTER — Encounter: Payer: Self-pay | Admitting: Cardiology

## 2018-03-07 ENCOUNTER — Telehealth: Payer: Self-pay | Admitting: Cardiology

## 2018-03-07 NOTE — Assessment & Plan Note (Signed)
Dyspnea on exertion does not seem to be a chronic thing for her but it has been happening off and on.  Because she is limited from a mobility standpoint, I think it best to exclude CAD/ischemic CAD.  We can perform this with a treadmill Myoview stress test.

## 2018-03-07 NOTE — Telephone Encounter (Signed)
   [facility] Pre-operative Risk Assessment    Request for surgical clearance:  1. What type of surgery is being performed? Splenectomy for autoimmune hemolytic anemia  2. When is this surgery scheduled? TBD   3. What type of clearance is required (medical clearance vs. Pharmacy clearance to hold med vs. Both)? Medical   4. Are there any medications that need to be held prior to surgery and how long? None specified    5. Practice name and name of physician performing surgery? Dr. Excell Seltzer @ [facility]    6. What is your office phone number [phone]    7.   What is your office fax number [phone] (Attn: Alean Rinne, RMA)  8.   Anesthesia type (None, local, MAC, general) ?  General anesthesia    Michelle Wilcox M 03/07/2018, 8:13 AM  _________________________________________________________________   (provider comments below)

## 2018-03-07 NOTE — Telephone Encounter (Signed)
Dr Ellyn Hack has seen for clearance and has ordered a stress test.  Clearance depending on stress test.

## 2018-03-07 NOTE — Assessment & Plan Note (Signed)
This is a difficult situation.  She was told she had CHF which was really diastolic dysfunction mediated diastolic heart failure in the setting of her  autoimmune hemolytic anemia.  She is on stable dose of beta-blocker and Lasix without really any recurrence of those symptoms.  She has normal renal function, no history of CAD or angina.  No diabetes on insulin and no prior stroke. Splenectomy as a potentially intraperitoneal surgery would be considered "high risk "from a cardiac standpoint.    Unfortunately, she is pretty sedentary and does not really do much activity and will be hard to tell if she actually has any anginal: Symptoms.  I would like to do an ischemic evaluation.  My preference would be a coronary CT angiogram, but the timing of this would potentially delay her plans. As such, we will proceed with Lexiscan Myoview to evaluate for ischemia.

## 2018-03-07 NOTE — Assessment & Plan Note (Signed)
Echocardiogram ordered in the setting of heart failure symptoms showed normal EF with only grade 1 diastolic dysfunction and mild pulmonary hypertension.  I do not think this should preclude her from having surgery.  Does not put her at risk, but diastolic dysfunction can be mediated by ischemia.  We will therefore evaluating for ischemia with a treadmill Myoview. Otherwise continue current dosing of Lasix to avoid volume overload, and beta-blocker.  The key recommendation with this diagnosis is to avoid volume overload perioperatively.  Pending results of Myoview stress test will make further recommendations.

## 2018-03-07 NOTE — Assessment & Plan Note (Signed)
LDL look pretty good back in 2017 have not seen it since.  She is actually not on statin and doing fairly well.  Continue continue to monitor.

## 2018-03-07 NOTE — Assessment & Plan Note (Signed)
Blood pressure well controlled

## 2018-03-11 ENCOUNTER — Ambulatory Visit (HOSPITAL_COMMUNITY)
Admission: RE | Admit: 2018-03-11 | Discharge: 2018-03-11 | Disposition: A | Discharge status: Home / Self Care | Payer: Medicare Other | Source: Ambulatory Visit | Attending: Internal Medicine | Admitting: Internal Medicine

## 2018-03-11 DIAGNOSIS — R0609 Other forms of dyspnea: Secondary | ICD-10-CM | POA: Diagnosis not present

## 2018-03-11 LAB — MYOCARDIAL PERFUSION IMAGING
CHL CUP NUCLEAR SDS: 5
CHL CUP NUCLEAR SRS: 3
LV dias vol: 69 mL (ref 46–106)
LVSYSVOL: 22 mL
Peak HR: 97 {beats}/min
Rest HR: 65 {beats}/min
SSS: 8
TID: 1

## 2018-03-11 MED ORDER — TECHNETIUM TC 99M TETROFOSMIN IV KIT
9.9000 | PACK | Freq: Once | INTRAVENOUS | Status: AC | PRN
  Administered 2018-03-11: 9.9 via INTRAVENOUS
  Filled 2018-03-11: qty 10

## 2018-03-11 MED ORDER — TECHNETIUM TC 99M TETROFOSMIN IV KIT
30.8000 | PACK | Freq: Once | INTRAVENOUS | Status: AC | PRN
  Administered 2018-03-11: 30.8 via INTRAVENOUS
  Filled 2018-03-11: qty 31

## 2018-03-11 MED ORDER — REGADENOSON 0.4 MG/5ML IV SOLN
0.4000 mg | Freq: Once | INTRAVENOUS | Status: AC
  Administered 2018-03-11: 0.4 mg via INTRAVENOUS

## 2018-03-11 NOTE — Telephone Encounter (Signed)
Low Risk Study -- just read today.  Michelle Wilcox

## 2018-03-11 NOTE — Telephone Encounter (Signed)
Dr. Ellyn Hack has evaluated for surgical clearance. I will fwd phone note to him so that he can provide final recommendations to surgeon after upcoming stress test is resulted. This phone note will be removed from the preop pool. Richardson Dopp, PA-C    03/11/2018 4:38 PM

## 2018-03-12 NOTE — Telephone Encounter (Signed)
It is after 5 pm surgeon's office is closed. Will manually fax Dr. Allison Quarry ov note and stress test results. I will remove from call back pool.

## 2018-03-12 NOTE — Telephone Encounter (Signed)
The following interpretation by Dr. Ellyn Hack was noted on the patient's stress test:  Michelle Man, MD  Liliane Shi, PA-C  Cc: Raiford Simmonds, RN; Cari Caraway, MD; Excell Seltzer, MD; Ladell Pier, MD        Just reviewed Stress Test on Michelle Wilcox - LOW rISK study -> Normal LVEF 69%. No ischemia or infarction   No further Cardiac testing recommended.  Continue current Medication.   Low RISK for Intermdiate-High Risk Sgx.   Standing dose of metoprolol reduces risks.   Michelle Hew, MD     Call back staff: Please make sure that the requesting surgeon has received a copy of the office note from Dr. Ellyn Hack and the stress test results with the documentation outlined above.  This note will be removed from the preop pool.  Richardson Dopp, PA-C    03/12/2018 2:53 PM

## 2018-03-13 ENCOUNTER — Telehealth: Payer: Self-pay | Admitting: Cardiology

## 2018-03-13 NOTE — Telephone Encounter (Signed)
Patient informed and copy sent to [facility] and [facility].

## 2018-03-13 NOTE — Telephone Encounter (Signed)
Follow Up:      Pt returning call from yesterday, concerning her Stress Test results. If pt does not answer her cell phone, please cal [phone].

## 2018-03-13 NOTE — Telephone Encounter (Signed)
Notes recorded by Leonie Man, MD on 03/11/2018 at 11:34 PM EDT Just reviewed Stress Test on Ms. [place] - LOW rISK study -> Normal LVEF 69%. No ischemia or infarction  No further Cardiac testing recommended. Continue current Medication.   Low RISK for Intermdiate-High Risk Sgx.  Standing dose of metoprolol reduces risks.  Glenetta Hew, MD

## 2018-03-17 ENCOUNTER — Inpatient Hospital Stay: Payer: Medicare Other

## 2018-03-17 ENCOUNTER — Telehealth: Payer: Self-pay

## 2018-03-17 ENCOUNTER — Inpatient Hospital Stay (HOSPITAL_BASED_OUTPATIENT_CLINIC_OR_DEPARTMENT_OTHER): Payer: Medicare Other | Admitting: Oncology

## 2018-03-17 VITALS — BP 111/62 | HR 74 | Temp 98.1°F | Resp 18 | Ht 64.0 in | Wt 190.8 lb

## 2018-03-17 DIAGNOSIS — D591 Autoimmune hemolytic anemia, unspecified: Secondary | ICD-10-CM

## 2018-03-17 DIAGNOSIS — R740 Nonspecific elevation of levels of transaminase and lactic acid dehydrogenase [LDH]: Secondary | ICD-10-CM

## 2018-03-17 LAB — CBC WITH DIFFERENTIAL (CANCER CENTER ONLY)
Basophils Absolute: 0.1 10*3/uL (ref 0.0–0.1)
Basophils Relative: 1 %
Eosinophils Absolute: 0.1 10*3/uL (ref 0.0–0.5)
Eosinophils Relative: 1 %
HEMATOCRIT: 39.3 % (ref 34.8–46.6)
HEMOGLOBIN: 12.5 g/dL (ref 11.6–15.9)
LYMPHS ABS: 0.7 10*3/uL — AB (ref 0.9–3.3)
Lymphocytes Relative: 7 %
MCH: 31.2 pg (ref 25.1–34.0)
MCHC: 31.8 g/dL (ref 31.5–36.0)
MCV: 98 fL (ref 79.5–101.0)
MONOS PCT: 2 %
Monocytes Absolute: 0.2 10*3/uL (ref 0.1–0.9)
NEUTROS ABS: 9.4 10*3/uL — AB (ref 1.5–6.5)
NEUTROS PCT: 89 %
PLATELETS: 261 10*3/uL (ref 145–400)
RBC: 4.01 MIL/uL (ref 3.70–5.45)
RDW: 14.1 % (ref 11.2–14.5)
WBC Count: 10.5 10*3/uL — ABNORMAL HIGH (ref 3.9–10.3)

## 2018-03-17 LAB — CMP (CANCER CENTER ONLY)
ALBUMIN: 4.1 g/dL (ref 3.5–5.0)
ALT: 13 U/L (ref 0–44)
ANION GAP: 8 (ref 5–15)
AST: 21 U/L (ref 15–41)
Alkaline Phosphatase: 71 U/L (ref 38–126)
BUN: 20 mg/dL (ref 8–23)
CHLORIDE: 102 mmol/L (ref 98–111)
CO2: 33 mmol/L — AB (ref 22–32)
Calcium: 9.3 mg/dL (ref 8.9–10.3)
Creatinine: 0.89 mg/dL (ref 0.44–1.00)
GFR, Estimated: >60 mL/min (ref >60)
GLUCOSE: 100 mg/dL — AB (ref 70–99)
Potassium: 3.7 mmol/L (ref 3.5–5.1)
SODIUM: 143 mmol/L (ref 135–145)
Total Bilirubin: 0.6 mg/dL (ref 0.3–1.2)
Total Protein: 6 g/dL — ABNORMAL LOW (ref 6.5–8.1)

## 2018-03-17 LAB — RETICULOCYTES
RBC.: 4.01 MIL/uL (ref 3.70–5.45)
RETIC COUNT ABSOLUTE: 140.4 10*3/uL — AB (ref 33.7–90.7)
RETIC CT PCT: 3.5 % — AB (ref 0.7–2.1)

## 2018-03-17 LAB — LACTATE DEHYDROGENASE: LDH: 292 U/L — AB (ref 98–192)

## 2018-03-17 NOTE — Telephone Encounter (Signed)
Prinited avs and calender of upcoming appointment.per 7/29 los

## 2018-03-17 NOTE — Progress Notes (Signed)
[facility] OFFICE PROGRESS NOTE   Diagnosis: Autoimmune hemolytic anemia  INTERVAL HISTORY:   Michelle Wilcox returns as scheduled.  She continues prednisone at a dose of 12.5 mg daily.  She underwent a cardiac stress test on 03/11/2018.  She noted erythema and thickening at the right arm injection site yesterday. She reports intermittent diarrhea for the past several days, partially relieved with Imodium. She saw Dr. Excell Seltzer and plans to schedule a splenectomy in September.  Objective:  Vital signs in last 24 hours:  Blood pressure 111/62, pulse 74, temperature 98.1 F (36.7 C), temperature source Oral, resp. rate 18, height 5\' 4"  (1.626 m), weight 190 lb 12.8 oz (86.5 kg), SpO2 97 %.    HEENT: No thrush Resp: Lungs clear bilaterally Cardio: Regular rate and rhythm GI: No hepatosplenomegaly, nontender, soft Vascular: No leg edema, there is mild erythema and induration at the right upper forearm extending superior to the elbow.  This appears to be in an area of a recent IV puncture.  No arm or hand swelling     Lab Results:  Lab Results  Component Value Date   WBC 10.5 (H) 03/17/2018   HGB 12.5 03/17/2018   HCT 39.3 03/17/2018   MCV 98.0 03/17/2018   PLT 261 03/17/2018   NEUTROABS 9.4 (H) 03/17/2018  Reticulocyte count 140  CMP  Lab Results  Component Value Date   NA 140 01/06/2018   K 4.4 01/06/2018   CL 101 01/06/2018   CO2 32 (H) 01/06/2018   GLUCOSE 127 01/06/2018   BUN 28 (H) 01/06/2018   CREATININE 0.95 01/06/2018   CALCIUM 9.3 01/06/2018   PROT 6.5 01/06/2018   ALBUMIN 4.3 01/06/2018   AST 39 (H) 01/06/2018   ALT 36 01/06/2018   ALKPHOS 84 01/06/2018   BILITOT 0.9 01/06/2018   GFRNONAA 58 (L) 01/06/2018   GFRAA >60 01/06/2018  LDH 292   Medications: I have reviewed the patient's current medications.   Assessment/Plan: 1. 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, DAT IgG positive  Prednisone  started 03/20/2016  Tapered to 15 mg daily on 04/30/2016  Hemoglobin lower, elevated LDH 05/21/2016-prednisone increased to 30 mgdaily  Prednisone taper to 25 mg daily 06/18/2016  Prednisone taper to 20 mg daily 07/16/2016  Prednisone taper to 15 mg daily 08/27/2016  Prednisone taper to 12.5 mg daily 09/25/2016  Hemoglobin lower, prednisone increased to 20 mgdaily on 10/11/2016  Cycle 1 rituximab 11/19/2016  Cycle 2 rituximab 11/26/2016  Cycle 3 rituximab 12/03/2016  Cycle 4 rituximab 12/28/2016  Prednisone taper to 15 mg daily 12/28/2016  Prednisone taper to 12.5 mg daily 01/28/2017  Prednisone taper to 10 mg daily 02/19/2017  Prednisone taper to 7.5 mg daily 04/01/2017  Prednisone taper to 5 mg daily 05/20/2017  Admission with progressive anemia and evidence of hemolysis 08/03/2017  Prednisone resumed at a dose of 60 mg daily  Prednisone taper to 40 mg 08/12/2017  Prednisone taper to 30 mg daily 08/24/2017  Prednisone taper to 20 mg daily 09/03/2017  Prednisone taper to 15 mg daily 09/20/2017  Prednisone taper to 12.5 mg daily 10/14/2017  Pednisone taper to 10 mg daily 11/05/2017  Progressiveanemia 12/16/2017  Prednisone increased to 20 mg daily 12/16/2017  Prednisone decreased to 15 mg daily 01/06/2018  Prednisone decreased to 12.5 mg daily 02/17/2018   Prednisone taper to 12.5 mg alternating with 10 mg daily 03/17/2018  2. History ofExertional dyspnea secondary to #1  3. Mild splenomegaly on CT 03/16/2016  4. History of polymyalgia rheumatica  5. History of giant cell/temporal arteritis-maintained on methotrexate in the remote past, now on low dose prednisone  6.Admission with leg edema 08/03/2017-diastolic heart failure?, Exacerbated by anemia  7.   Right arm superficial phlebitis 03/17/2018  Disposition: Michelle Wilcox appears well.  She has been evaluated by Dr. Excell Seltzer and plans to schedule a splenectomy in September.  The  hemoglobin is stable.  We tapered the prednisone to 12.5 mg alternating with 10 mg daily.  She appears to have developed superficial phlebitis at the IV site from the cardiac procedure.  She will use warm compresses and take aspirin daily for the next week.  She will contact us if this area does not improve or if she develops arm/hand swelling.  She will follow-up with Dr. Leonides Schanz if the diarrhea does not resolve.  Michelle Wilcox will return for an office visit in 3 weeks.    Betsy Coder, MD  03/17/2018  11:09 AM

## 2018-03-18 ENCOUNTER — Other Ambulatory Visit: Payer: Self-pay

## 2018-03-18 DIAGNOSIS — D591 Autoimmune hemolytic anemia, unspecified: Secondary | ICD-10-CM

## 2018-03-18 MED ORDER — PREDNISONE 10 MG PO TABS: 10.0000 mg | ORAL_TABLET | Freq: Every day | ORAL | 2 refills | Status: DC

## 2018-03-18 MED ORDER — PREDNISONE 2.5 MG PO TABS: 2.5000 mg | ORAL_TABLET | Freq: Every day | ORAL | 2 refills | Status: DC

## 2018-03-18 NOTE — Progress Notes (Signed)
Current prednisone dosing: take 12.5mg  alternating with 10mg  per Dr. Benay Spice

## 2018-03-19 DIAGNOSIS — R197 Diarrhea, unspecified: Secondary | ICD-10-CM | POA: Diagnosis not present

## 2018-03-25 NOTE — Progress Notes (Signed)
Cardiology Office Note   Date:  03/26/2018   ID:  Shaneequa, Bahner 03-Sep-1945, MRN [id number]  PCP:  Cari Caraway, MD  Cardiologist:  Dr. Ellyn Hack  Chief Complaint  Patient presents with  . Follow-up     History of Present Illness: Kienna Moncada is a 72 y.o. female who presents for ongoing assessment and management of Diastolic CHF, with history of Hypertension, Hyperlipidemia, chronic DOE, with other history of autoimmune hemolytic anemia, giant cell temporal arteritis and PMR, GERD,anxiety,  and osteoarthritis.   She was admitted in December of 2018 for decompensated CHF and pneumonia. She was last seen by Dr. Ellyn Hack for pre-operative clearance to have splenectomy. She was considered low risk. A lexiscan myoview was ordered. This was found to be negative for ischemia. She was cleared to have surgery on 03/11/2018, and to continue BB therapy.   She comes today with multiple complaints mostly of GI origin. She has had explosive diarrhea and abdominal pain since leaving the hospital  She has had diarrhea daily. She states that PCP thought she had a virus but she has continued to have this for over a month.  She denies chest pain or fluid retention. She is medically compliant She is to see surgeon this week for planning splenectomy.   Past Medical History:  Diagnosis Date  . Anxiety   . Depression   . Giant cell arteritis ([place])   . Gouty arthropathy   . Hemolytic anemia ([place])   . Hyperlipidemia   . Hypertension   . Migraines   . Polymyalgia ([place])   . Polymyalgia rheumatica ([place])   . Temporal arteritis [facility])     Past Surgical History:  Procedure Laterality Date  . BARTHOLIN GLAND CYST EXCISION     non ca  . EYE MUSCLE SURGERY     as a child 68 yrs old  . KNEE SURGERY  2004   rt knee  . toncil    . TONSILLECTOMY     as a child     Current Outpatient Medications  Medication Sig Dispense Refill  . acetaminophen (TYLENOL) 650 MG CR tablet Take 1,300 mg  by mouth 2 (two) times daily.    Marland Kitchen ALPRAZolam (XANAX) 0.25 MG tablet Take 1 tablet (0.25 mg total) by mouth at bedtime as needed for anxiety. (Patient taking differently: Take 1 tablet (0.25 mg total) by mouth at bedtime as needed for anxiety.) 30 tablet 0  . cholecalciferol (VITAMIN D) 1000 UNITS tablet Take 2,000 Units by mouth daily.     . folic acid (FOLVITE) 1 MG tablet Take 1 tablet (1 mg total) by mouth daily. 90 tablet 0  . furosemide (LASIX) 40 MG tablet Take 20-40 mg by mouth daily.    Marland Kitchen loperamide (IMODIUM A-D) 2 MG tablet Take 2 mg by mouth as needed for diarrhea or loose stools (takes 2 if needed).     . loratadine (CLARITIN) 10 MG tablet Take 10 mg by mouth daily.    . metoprolol tartrate (LOPRESSOR) 50 MG tablet Take 1 tablet (50 mg total) by mouth every morning AND take 0.5 tablet (25 mg total) by mouth every evening.    . Multiple Vitamin (MULTIVITAMIN) tablet Take 1 tablet by mouth every evening.     Marland Kitchen omeprazole (PRILOSEC OTC) 20 MG tablet Take 20 mg by mouth every morning.     . potassium chloride SA (K-DUR,KLOR-CON) 20 MEQ tablet Take 10-20 mEq by mouth daily.    . predniSONE (DELTASONE) 10 MG  tablet Take 1 tablet (10 mg total) by mouth daily with breakfast. (Patient taking differently: Take 10 mg by mouth every other day. ) 30 tablet 2  . predniSONE (DELTASONE) 2.5 MG tablet Take 1 tablet (2.5 mg total) by mouth daily with breakfast. (Patient taking differently: Take 2.5 mg by mouth every other day. ) 30 tablet 2  . simethicone (GAS-X) 80 MG chewable tablet Chew 80 mg by mouth every 6 (six) hours as needed for flatulence.     . VENTOLIN HFA 108 (90 Base) MCG/ACT inhaler Take 1-2 puffs by mouth every 4 (four) hours as needed for shortness of breath.   0   No current facility-administered medications for this visit.     Allergies:   Penicillins; Ceftin [cefuroxime axetil]; Ciprofloxacin; Darvocet [propoxyphene n-acetaminophen]; Levaquin [levofloxacin in d5w]; Sulfa antibiotics;  and Zyrtec [cetirizine hcl]    Social History:  The patient  reports that she has never smoked. She has never used smokeless tobacco. She reports that she drinks alcohol. She reports that she does not use drugs.   Family History:  The patient's family history includes Arthritis in her mother; Other in her mother; Stroke in her mother. She was adopted.    ROS: All other systems are reviewed and negative. Unless otherwise mentioned in H&P    PHYSICAL EXAM: VS:  BP 115/70   Pulse 71   Ht 5\' 4"  (1.626 m)   Wt 190 lb (86.2 kg)   BMI 32.61 kg/m  , BMI Body mass index is 32.61 kg/m. GEN: Well nourished, well developed, in no acute distress  HEENT: normal  Neck: no JVD, carotid bruits, or masses Cardiac: RRR; no murmurs, rubs, or gallops,no edema  Respiratory:  Clear to auscultation bilaterally, normal work of breathing GI: soft, tender, nondistended, + BS MS: no deformity or atrophy  Skin: warm and dry, no rash Neuro:  Strength and sensation are intact Psych: euthymic mood, full affect   EKG:  Not completed during office visit.   Recent Labs: 08/03/2017: B Natriuretic Peptide 282.5 08/17/17: TSH 0.661 08/07/2017: Magnesium 2.2 03/17/2018: ALT 13; BUN 20; Creatinine 0.89; Hemoglobin 12.5; Platelet Count 261; Potassium 3.7; Sodium 143    Lipid Panel    Component Value Date/Time   CHOL 145 03/13/2016 0945   TRIG 103 03/13/2016 0945   HDL 55 03/13/2016 0945   CHOLHDL 2.6 03/13/2016 0945   LDLCALC 69 03/13/2016 0945      Wt Readings from Last 3 Encounters:  03/26/18 190 lb (86.2 kg)  03/17/18 190 lb 12.8 oz (86.5 kg)  03/11/18 190 lb (86.2 kg)      Other studies Reviewed: Echocardiogram 08-17-18  Left ventricle: The cavity size was normal. Wall thickness was   normal. Systolic function was normal. The estimated ejection   fraction was in the range of 60% to 65%. Wall motion was normal;   there were no regional wall motion abnormalities. Doppler   parameters are  consistent with abnormal left ventricular   relaxation (grade 1 diastolic dysfunction). - Aortic valve: There was no stenosis. There was mild   regurgitation. - Mitral valve: There was mild regurgitation. - Left atrium: The atrium was mildly dilated. - Right ventricle: The cavity size was normal. Systolic function   was normal. - Tricuspid valve: Peak RV-RA gradient (S): 35 mm Hg. - Pulmonary arteries: PA peak pressure: 43 mm Hg (S). - Systemic veins: IVC measured 2.3 cm with > 50% respirophasic   variation, suggesting RA pressure 8 mmHg.  Impressions:  -  Normal LV size with EF 60-65%. Normal RV size and systolic   function. Mild mitral regurgitation. Mild pulmonary hypertension.  Carlton Adam Stress Test 03/11/2018  Study Highlights     The left ventricular ejection fraction is hyperdynamic (>65%).  Nuclear stress EF: 69%.  There was no ST segment deviation noted during stress.  The study is normal.  This is a low risk study.   Low risk stress nuclear study with normal perfusion and normal left ventricular regional and global systolic function.    ASSESSMENT AND PLAN:  1. Pre-Operative Clearance for Splenectomy: She has been cleared for this during hospital consultation last month. She had a normal low risk stress test. Therefore no further cardiac testing is noted.  :   2. Gastritis r/o diverticulitis: She is referred to GI, has seen Dr. Amada Kingfisher in the past. In the interim I will start her on Flagyl 500 mg TID and Doxycyline 100 mg BID.  She is started on Pro-Biotics supplements.   She has multiple allergies. Hopefully these medications will not cause her to have reaction. I have checked them against her existing allergies before prescribing. She will need to see GI for ongoing recommendations and testing. She will likely need stool sample. Checking BMET today.   3.Chronic Diastolic CHF: She is doing well and has no indication of fluid overload at this time.   4. Hemolytic  Anemia; Followed by hematology.  .   Current medicines are reviewed at length with the patient today.    Labs/ tests ordered today include: BMET  Phill Myron. West Pugh, ANP, AACC   03/26/2018 8:55 AM    [facility] [address], [place], [place] [address] Phone: [phone]; Fax: [phone]

## 2018-03-26 ENCOUNTER — Encounter: Payer: Self-pay | Admitting: Adult Health

## 2018-03-26 ENCOUNTER — Ambulatory Visit (INDEPENDENT_AMBULATORY_CARE_PROVIDER_SITE_OTHER): Payer: Medicare Other | Admitting: Adult Health

## 2018-03-26 VITALS — BP 115/70 | HR 71 | Ht 64.0 in | Wt 190.0 lb

## 2018-03-26 DIAGNOSIS — Z800 Family history of malignant neoplasm of digestive organs: Secondary | ICD-10-CM | POA: Diagnosis not present

## 2018-03-26 DIAGNOSIS — R197 Diarrhea, unspecified: Secondary | ICD-10-CM

## 2018-03-26 DIAGNOSIS — E8770 Fluid overload, unspecified: Secondary | ICD-10-CM | POA: Diagnosis not present

## 2018-03-26 DIAGNOSIS — R1319 Other dysphagia: Secondary | ICD-10-CM | POA: Diagnosis not present

## 2018-03-26 DIAGNOSIS — I5032 Chronic diastolic (congestive) heart failure: Secondary | ICD-10-CM

## 2018-03-26 DIAGNOSIS — D591 Autoimmune hemolytic anemia, unspecified: Secondary | ICD-10-CM

## 2018-03-26 DIAGNOSIS — Z01810 Encounter for preprocedural cardiovascular examination: Secondary | ICD-10-CM | POA: Diagnosis not present

## 2018-03-26 LAB — BASIC METABOLIC PANEL
BUN / CREAT RATIO: 26 (ref 12–28)
BUN: 24 mg/dL (ref 8–27)
CO2: 25 mmol/L (ref 20–29)
CREATININE: 0.91 mg/dL (ref 0.57–1.00)
Calcium: 9.6 mg/dL (ref 8.7–10.3)
Chloride: 101 mmol/L (ref 96–106)
GFR calc non Af Amer: 63 mL/min/{1.73_m2} (ref >59)
GFR, EST AFRICAN AMERICAN: 72 mL/min/{1.73_m2} (ref >59)
Glucose: 87 mg/dL (ref 65–99)
Potassium: 4.2 mmol/L (ref 3.5–5.2)
Sodium: 143 mmol/L (ref 134–144)

## 2018-03-26 MED ORDER — METRONIDAZOLE 500 MG PO TABS: 500.0000 mg | ORAL_TABLET | Freq: Four times a day (QID) | ORAL | 0 refills | Status: DC

## 2018-03-26 MED ORDER — DOXYCYCLINE HYCLATE 100 MG PO CAPS: 100.0000 mg | ORAL_CAPSULE | Freq: Two times a day (BID) | ORAL | 0 refills | Status: DC

## 2018-03-26 NOTE — Patient Instructions (Signed)
Medication Instructions:  START FLAGYL 500MG  EVERY 6 HOURS FOR 7 DAYS  DOXYCYCLINE 100MG  TWICE DAILY (EVERY 12 HOURS)  START ALIGN THIS IS OVER-THE-COUNTER  If you need a refill on your cardiac medications before your next appointment, please call your pharmacy.  Labwork: BMET TODAY HERE IN OUR OFFICE AT [facility]  Take the provided lab slips with you to the lab for your blood draw.   Special Instructions: WHEN YOU MAKE GI APPOINTMENT MAKE SURE TO ASK IF THE WANT A STOOL SAMPLE  TAKE LASIX 20 MG SHOULD YOU HAVE SWELLING MAKE SURE TO CALL FOR LASIX DIRECTIONS.  Follow-Up: Your physician wants you to follow-up in: [place] should receive a reminder letter in the mail two months in advance. If you do not receive a letter, please call our office [phone] to schedule the JAN 2019 follow-up appointment.   Thank you for choosing [facility] at [facility]!!

## 2018-03-27 DIAGNOSIS — R197 Diarrhea, unspecified: Secondary | ICD-10-CM | POA: Diagnosis not present

## 2018-04-07 ENCOUNTER — Inpatient Hospital Stay: Payer: Medicare Other | Attending: Nurse Practitioner

## 2018-04-07 ENCOUNTER — Telehealth: Payer: Self-pay | Admitting: Oncology

## 2018-04-07 ENCOUNTER — Inpatient Hospital Stay (HOSPITAL_BASED_OUTPATIENT_CLINIC_OR_DEPARTMENT_OTHER): Payer: Medicare Other | Admitting: Oncology

## 2018-04-07 VITALS — BP 118/61 | HR 67 | Temp 97.8°F | Resp 14 | Ht 64.0 in | Wt 186.7 lb

## 2018-04-07 DIAGNOSIS — R740 Nonspecific elevation of levels of transaminase and lactic acid dehydrogenase [LDH]: Secondary | ICD-10-CM

## 2018-04-07 DIAGNOSIS — D591 Autoimmune hemolytic anemia, unspecified: Secondary | ICD-10-CM

## 2018-04-07 DIAGNOSIS — Z79899 Other long term (current) drug therapy: Secondary | ICD-10-CM | POA: Insufficient documentation

## 2018-04-07 DIAGNOSIS — M353 Polymyalgia rheumatica: Secondary | ICD-10-CM

## 2018-04-07 DIAGNOSIS — R161 Splenomegaly, not elsewhere classified: Secondary | ICD-10-CM

## 2018-04-07 LAB — CBC WITH DIFFERENTIAL (CANCER CENTER ONLY)
BASOS PCT: 1 %
Basophils Absolute: 0.1 10*3/uL (ref 0.0–0.1)
EOS ABS: 0.1 10*3/uL (ref 0.0–0.5)
Eosinophils Relative: 1 %
HCT: 36.4 % (ref 34.8–46.6)
HEMOGLOBIN: 11.5 g/dL — AB (ref 11.6–15.9)
LYMPHS ABS: 0.8 10*3/uL — AB (ref 0.9–3.3)
Lymphocytes Relative: 9 %
MCH: 30.8 pg (ref 25.1–34.0)
MCHC: 31.6 g/dL (ref 31.5–36.0)
MCV: 97.6 fL (ref 79.5–101.0)
Monocytes Absolute: 0.4 10*3/uL (ref 0.1–0.9)
Monocytes Relative: 4 %
NEUTROS PCT: 85 %
Neutro Abs: 7.7 10*3/uL — ABNORMAL HIGH (ref 1.5–6.5)
PLATELETS: 238 10*3/uL (ref 145–400)
RBC: 3.73 MIL/uL (ref 3.70–5.45)
RDW: 14.5 % (ref 11.2–14.5)
WBC: 9.1 10*3/uL (ref 3.9–10.3)

## 2018-04-07 LAB — CMP (CANCER CENTER ONLY)
ALBUMIN: 3.8 g/dL (ref 3.5–5.0)
ALT: 10 U/L (ref 0–44)
ANION GAP: 7 (ref 5–15)
AST: 19 U/L (ref 15–41)
Alkaline Phosphatase: 61 U/L (ref 38–126)
BUN: 26 mg/dL — AB (ref 8–23)
CHLORIDE: 107 mmol/L (ref 98–111)
CO2: 31 mmol/L (ref 22–32)
Calcium: 9 mg/dL (ref 8.9–10.3)
Creatinine: 0.8 mg/dL (ref 0.44–1.00)
GFR, Est AFR Am: >60 mL/min (ref >60)
Glucose, Bld: 90 mg/dL (ref 70–99)
Potassium: 3.6 mmol/L (ref 3.5–5.1)
Sodium: 145 mmol/L (ref 135–145)
Total Bilirubin: 0.5 mg/dL (ref 0.3–1.2)
Total Protein: 5.8 g/dL — ABNORMAL LOW (ref 6.5–8.1)

## 2018-04-07 LAB — LACTATE DEHYDROGENASE: LDH: 231 U/L — ABNORMAL HIGH (ref 98–192)

## 2018-04-07 LAB — RETICULOCYTES
RBC.: 3.73 MIL/uL (ref 3.70–5.45)
RETIC COUNT ABSOLUTE: 141.7 10*3/uL — AB (ref 33.7–90.7)
RETIC CT PCT: 3.8 % — AB (ref 0.7–2.1)

## 2018-04-07 NOTE — Telephone Encounter (Signed)
Appts scheduled AVS/Calendar printed per 8/19 los °

## 2018-04-07 NOTE — Progress Notes (Signed)
[facility] OFFICE PROGRESS NOTE   Diagnosis: Autoimmune hemolytic anemia  INTERVAL HISTORY:   Ms. Vukelich returns for a scheduled visit.  She has been evaluated by Dr. Excell Seltzer for a splenectomy.  She is undergone a preoperative cardiac evaluation. She reports developing diarrhea over the past several weeks, that worsened after the cardiac stress test in July.  She was placed on probiotic and "antibiotics "by cardiology.  She discontinue antibiotics after 1 day as recommended by gastroenterology.  She is undergone continued evaluation for the diarrhea by gastroenterology and is scheduled for colonoscopy with Dr. Watt Climes next week. The diarrhea has partially improved.  She has altered her diet and continues a probiotic. The area of phlebitis at the right arm has resolved.  Objective:  Vital signs in last 24 hours:  Blood pressure 118/61, pulse 67, temperature 97.8 F (36.6 C), temperature source Oral, resp. rate 14, height 5\' 4"  (1.626 m), weight 186 lb 11.2 oz (84.7 kg), SpO2 99 %.    HEENT: Mild white coat over the tongue, no buccal thrush Lymphatics: No cervical or supraclavicular nodes Resp: Lungs clear bilaterally Cardio: Regular rate and rhythm GI: No hepatosplenomegaly Vascular: No leg edema  Skin: No erythema or induration at the right arm    Lab Results:  Lab Results  Component Value Date   WBC 9.1 04/07/2018   HGB 11.5 (L) 04/07/2018   HCT 36.4 04/07/2018   MCV 97.6 04/07/2018   PLT 238 04/07/2018   NEUTROABS 7.7 (H) 04/07/2018   Reticulocyte count-141.7 CMP  Lab Results  Component Value Date   NA 143 03/26/2018   K 4.2 03/26/2018   CL 101 03/26/2018   CO2 25 03/26/2018   GLUCOSE 87 03/26/2018   BUN 24 03/26/2018   CREATININE 0.91 03/26/2018   CALCIUM 9.6 03/26/2018   PROT 6.0 (L) 03/17/2018   ALBUMIN 4.1 03/17/2018   AST 21 03/17/2018   ALT 13 03/17/2018   ALKPHOS 71 03/17/2018   BILITOT 0.6 03/17/2018   GFRNONAA 63 03/26/2018   GFRAA 72 03/26/2018   LDH-231  Medications: I have reviewed the patient's current medications.   Assessment/Plan: 1. 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5 mg daily 01/28/2017  Prednisone taper to 10 mg daily 02/19/2017  Prednisone taper to 7.5 mg daily 04/01/2017  Prednisone taper to 5 mg daily 05/20/2017  Admission with progressive anemia and evidence of hemolysis 08/03/2017  Prednisone resumed at a dose of 60 mg daily  Prednisone taper to 40 mg 08/12/2017  Prednisone taper to 30 mg daily 08/24/2017  Prednisone taper to 20 mg daily 09/03/2017  Prednisone taper to 15 mg daily 09/20/2017  Prednisone taper to 12.5 mg daily 10/14/2017  Pednisone taper to 10 mg daily 11/05/2017  Progressiveanemia 12/16/2017  Prednisone increased to 20 mg daily 12/16/2017  Prednisone decreased to 15 mg daily 01/06/2018  Prednisone decreased to 12.5 mg daily 02/17/2018  Prednisone taper to 12.5 mg alternating with 10 mg daily 03/17/2018  2. History ofExertional dyspnea secondary to #1  3. Mild splenomegaly on CT 03/16/2016  4. History of polymyalgia rheumatica  5. History of giant cell/temporal arteritis-maintained on methotrexate in the remote past, now on low dose prednisone  6.Admission with leg edema 08/03/2017-diastolic heart  failure?, Exacerbated by anemia  7.   Right arm superficial phlebitis 03/17/2018, resolved   Disposition: Ms. Katich appears unchanged.  She complains of diarrhea for the past month.  She is scheduled for colonoscopy with Dr. Watt Climes next week.  She will discuss the use of a probiotic and further evaluation of the diarrhea with Dr. Watt Climes.  She continues to have evidence of hemolysis.  The hemoglobin is slightly lower today.  She will continue prednisone at the current dose.  We discussed treatment of the hemolytic anemia.  She has seen Dr. Excell Seltzer.  She would like to delay scheduling surgery until after undergoing the colonoscopy.  We discussed alternate treatment options including cyclosporine, cyclophosphamide, and CellCept.  Ms. Apsey will return for office visit and CBC in 3 weeks.  Betsy Coder, MD  04/07/2018  9:48 AM

## 2018-04-16 DIAGNOSIS — R197 Diarrhea, unspecified: Secondary | ICD-10-CM | POA: Diagnosis not present

## 2018-04-16 DIAGNOSIS — Z800 Family history of malignant neoplasm of digestive organs: Secondary | ICD-10-CM | POA: Diagnosis not present

## 2018-04-16 DIAGNOSIS — K52831 Collagenous colitis: Secondary | ICD-10-CM | POA: Diagnosis not present

## 2018-04-16 DIAGNOSIS — K5730 Diverticulosis of large intestine without perforation or abscess without bleeding: Secondary | ICD-10-CM | POA: Diagnosis not present

## 2018-04-18 DIAGNOSIS — K52831 Collagenous colitis: Secondary | ICD-10-CM | POA: Diagnosis not present

## 2018-04-28 ENCOUNTER — Inpatient Hospital Stay (HOSPITAL_BASED_OUTPATIENT_CLINIC_OR_DEPARTMENT_OTHER): Payer: Medicare Other | Admitting: Nurse Practitioner

## 2018-04-28 ENCOUNTER — Encounter: Payer: Self-pay | Admitting: Nurse Practitioner

## 2018-04-28 ENCOUNTER — Telehealth: Payer: Self-pay | Admitting: Nurse Practitioner

## 2018-04-28 ENCOUNTER — Inpatient Hospital Stay: Payer: Medicare Other | Attending: Oncology

## 2018-04-28 VITALS — BP 125/68 | HR 69 | Temp 98.9°F | Resp 18 | Ht 64.0 in | Wt 188.4 lb

## 2018-04-28 DIAGNOSIS — Z6832 Body mass index (BMI) 32.0-32.9, adult: Secondary | ICD-10-CM | POA: Diagnosis not present

## 2018-04-28 DIAGNOSIS — Z7952 Long term (current) use of systemic steroids: Secondary | ICD-10-CM | POA: Insufficient documentation

## 2018-04-28 DIAGNOSIS — D591 Autoimmune hemolytic anemia, unspecified: Secondary | ICD-10-CM

## 2018-04-28 DIAGNOSIS — E669 Obesity, unspecified: Secondary | ICD-10-CM | POA: Diagnosis not present

## 2018-04-28 DIAGNOSIS — Z79899 Other long term (current) drug therapy: Secondary | ICD-10-CM | POA: Diagnosis not present

## 2018-04-28 DIAGNOSIS — M8580 Other specified disorders of bone density and structure, unspecified site: Secondary | ICD-10-CM | POA: Diagnosis not present

## 2018-04-28 DIAGNOSIS — M2550 Pain in unspecified joint: Secondary | ICD-10-CM | POA: Diagnosis not present

## 2018-04-28 DIAGNOSIS — M315 Giant cell arteritis with polymyalgia rheumatica: Secondary | ICD-10-CM | POA: Diagnosis not present

## 2018-04-28 DIAGNOSIS — M150 Primary generalized (osteo)arthritis: Secondary | ICD-10-CM | POA: Diagnosis not present

## 2018-04-28 DIAGNOSIS — Z23 Encounter for immunization: Secondary | ICD-10-CM | POA: Diagnosis not present

## 2018-04-28 DIAGNOSIS — M353 Polymyalgia rheumatica: Secondary | ICD-10-CM | POA: Diagnosis not present

## 2018-04-28 LAB — RETICULOCYTES
RBC.: 3.9 MIL/uL (ref 3.70–5.45)
RETIC CT PCT: 3.4 % — AB (ref 0.7–2.1)
Retic Count, Absolute: 132.6 10*3/uL — ABNORMAL HIGH (ref 33.7–90.7)

## 2018-04-28 LAB — CBC WITH DIFFERENTIAL (CANCER CENTER ONLY)
Basophils Absolute: 0 10*3/uL (ref 0.0–0.1)
Basophils Relative: 0 %
EOS ABS: 0.1 10*3/uL (ref 0.0–0.5)
EOS PCT: 1 %
HCT: 37.8 % (ref 34.8–46.6)
HEMOGLOBIN: 12 g/dL (ref 11.6–15.9)
LYMPHS ABS: 0.7 10*3/uL — AB (ref 0.9–3.3)
LYMPHS PCT: 8 %
MCH: 30.8 pg (ref 25.1–34.0)
MCHC: 31.7 g/dL (ref 31.5–36.0)
MCV: 96.9 fL (ref 79.5–101.0)
MONOS PCT: 3 %
Monocytes Absolute: 0.2 10*3/uL (ref 0.1–0.9)
NEUTROS PCT: 88 %
Neutro Abs: 7.5 10*3/uL — ABNORMAL HIGH (ref 1.5–6.5)
Platelet Count: 219 10*3/uL (ref 145–400)
RBC: 3.9 MIL/uL (ref 3.70–5.45)
RDW: 14.5 % (ref 11.2–14.5)
WBC Count: 8.5 10*3/uL (ref 3.9–10.3)

## 2018-04-28 NOTE — Progress Notes (Addendum)
Michelle Wilcox   Diagnosis: Autoimmune hemolytic anemia  INTERVAL HISTORY:   Michelle Wilcox returns as scheduled.  She continues prednisone 12.5 mg alternating with 10 mg.  She reports the recent colonoscopy was unremarkable.  The diarrhea is better.  Objective:  Vital signs in last 24 hours:  Blood pressure 125/68, pulse 69, temperature 98.9 F (37.2 C), temperature source Oral, resp. rate 18, height 5\' 4"  (1.626 m), weight 188 lb 6.4 oz (85.5 kg), SpO2 96 %.    HEENT: No thrush or ulcers. Lymphatics: No palpable cervical or supraclavicular lymph nodes. Resp: Lungs clear bilaterally. Cardio: Regular rate and rhythm. GI: Abdomen soft and nontender.  No hepatosplenomegaly. Vascular: No leg edema.  Lab Results:  Lab Results  Component Value Date   WBC 8.5 04/28/2018   HGB 12.0 04/28/2018   HCT 37.8 04/28/2018   MCV 96.9 04/28/2018   PLT 219 04/28/2018   NEUTROABS 7.5 (H) 04/28/2018    Imaging:  No results found.  Medications: I have reviewed the patient's current medications.  Assessment/Plan: 1. 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5 mg daily 01/28/2017  Prednisone taper to 10 mg daily 02/19/2017  Prednisone taper to 7.5 mg daily 04/01/2017  Prednisone taper to 5 mg daily  05/20/2017  Admission with progressive anemia and evidence of hemolysis 08/03/2017  Prednisone resumed at a dose of 60 mg daily  Prednisone taper to 40 mg 08/12/2017  Prednisone taper to 30 mg daily 08/24/2017  Prednisone taper to 20 mg daily 09/03/2017  Prednisone taper to 15 mg daily 09/20/2017  Prednisone taper to 12.5 mg daily 10/14/2017  Pednisone taper to 10 mg daily 11/05/2017  Progressiveanemia 12/16/2017  Prednisone increased to 20 mg daily 12/16/2017  Prednisone decreased to 15 mg daily 01/06/2018  Prednisone decreased to 12.5 mg daily 02/17/2018  Prednisone taper to 12.5 mg alternating with 10 mg daily 03/17/2018  2. History ofExertional dyspnea secondary to #1  3. Mild splenomegaly on CT 03/16/2016  4. History of polymyalgia rheumatica  5. History of giant cell/temporal arteritis-maintained on methotrexate in the remote past, now on low dose prednisone  6.Admission with leg edema 08/03/2017-diastolic heart failure?, Exacerbated by anemia  7.Right arm superficial phlebitis 03/17/2018, resolved   Disposition: Michelle Wilcox appears unchanged. We reviewed the CBC from today.  Hemoglobin remains stable.  She will continue prednisone at the current dose alternating 12.5 mg with 10 mg.  Dr. Benay Spice again discussed treatment options for the hemolytic anemia.  She plans to contact Dr. Excell Seltzer in the near future to schedule the splenectomy.  She will contact our office or her PCP to schedule the necessary vaccines.  She will return for a CBC in 3 weeks.  She will return for lab and follow-up in 6 weeks.  She will contact the office in the interim with any problems.  Patient seen with Dr. Benay Spice.  Michelle Wilcox ANP/GNP-BC  04/28/2018  4:31 PM  This was a shared visit with Michelle Wilcox.  The hemoglobin is stable.  She will continue prednisone at the current dose.  We discussed treatment with splenectomy versus other immunosuppressive medications.  She  plans to proceed with the splenectomy.  She will complete vaccines prior to splenectomy as recommended by Dr. Excell Seltzer.  Michelle Manson, MD

## 2018-04-28 NOTE — Telephone Encounter (Signed)
Scheduled appt per 9/9 los - gave patient aVS and calender per los.   

## 2018-05-09 ENCOUNTER — Ambulatory Visit: Payer: Self-pay | Admitting: General Surgery

## 2018-05-19 ENCOUNTER — Telehealth: Payer: Self-pay | Admitting: *Deleted

## 2018-05-19 ENCOUNTER — Inpatient Hospital Stay: Payer: Medicare Other

## 2018-05-19 DIAGNOSIS — D591 Autoimmune hemolytic anemia, unspecified: Secondary | ICD-10-CM

## 2018-05-19 DIAGNOSIS — Z7952 Long term (current) use of systemic steroids: Secondary | ICD-10-CM | POA: Diagnosis not present

## 2018-05-19 LAB — RETICULOCYTES
RBC.: 3.85 MIL/uL (ref 3.70–5.45)
RETIC COUNT ABSOLUTE: 127.1 10*3/uL — AB (ref 33.7–90.7)
Retic Ct Pct: 3.3 % — ABNORMAL HIGH (ref 0.7–2.1)

## 2018-05-19 LAB — CBC WITH DIFFERENTIAL (CANCER CENTER ONLY)
BASOS ABS: 0.1 10*3/uL (ref 0.0–0.1)
Basophils Relative: 1 %
Eosinophils Absolute: 0.3 10*3/uL (ref 0.0–0.5)
Eosinophils Relative: 3 %
HEMATOCRIT: 37.4 % (ref 34.8–46.6)
HEMOGLOBIN: 11.9 g/dL (ref 11.6–15.9)
LYMPHS PCT: 11 %
Lymphs Abs: 1 10*3/uL (ref 0.9–3.3)
MCH: 30.9 pg (ref 25.1–34.0)
MCHC: 31.8 g/dL (ref 31.5–36.0)
MCV: 97.1 fL (ref 79.5–101.0)
Monocytes Absolute: 0.4 10*3/uL (ref 0.1–0.9)
Monocytes Relative: 5 %
NEUTROS ABS: 7.1 10*3/uL — AB (ref 1.5–6.5)
NEUTROS PCT: 80 %
Platelet Count: 213 10*3/uL (ref 145–400)
RBC: 3.85 MIL/uL (ref 3.70–5.45)
RDW: 14.4 % (ref 11.2–14.5)
WBC Count: 8.9 10*3/uL (ref 3.9–10.3)

## 2018-05-19 NOTE — Telephone Encounter (Signed)
Patient called to advise splenectomy has been scheduled for Oct 30 at 69 am Dr. Excell Seltzer at [facility]. MD advised.

## 2018-05-20 ENCOUNTER — Telehealth: Payer: Self-pay | Admitting: Emergency Medicine

## 2018-05-20 NOTE — Telephone Encounter (Addendum)
VM left in detail regarding this note. Call back number given.   ----- Message from Ladell Pier, MD sent at 05/19/2018  5:08 PM EDT ----- Please call patient, hemoglobin is stable, continue prednisone at the current dose

## 2018-05-26 DIAGNOSIS — Z23 Encounter for immunization: Secondary | ICD-10-CM | POA: Diagnosis not present

## 2018-05-26 DIAGNOSIS — Z01818 Encounter for other preprocedural examination: Secondary | ICD-10-CM | POA: Diagnosis not present

## 2018-05-26 DIAGNOSIS — D591 Other autoimmune hemolytic anemias: Secondary | ICD-10-CM | POA: Diagnosis not present

## 2018-06-06 NOTE — Progress Notes (Signed)
Stress test 03-11-18 epic / clearance   ekg 02-23-18 epic cxr 08-03-17 epic  Echo 08-04-17 epic

## 2018-06-06 NOTE — Patient Instructions (Addendum)
Michelle Wilcox  06/06/2018   Your procedure is scheduled on: 06-18-18  Report to [facility]  Entrance             Report to admitting at     [facility] AM    Call this number if you have problems the morning of surgery [phone]    Remember: NO SOLID FOOD AFTER MIDNIGHT THE NIGHT PRIOR TO SURGERY. NOTHING BY MOUTH EXCEPT CLEAR LIQUIDS UNTIL 3 HOURS PRIOR TO [place] SURGERY. PLEASE FINISH ENSURE             DRINK PER SURGEON ORDER 3 HOURS PRIOR TO SCHEDULED SURGERY TIME WHICH NEEDS TO BE COMPLETED AT _____0530 am then nothing by mouth_______.    CLEAR LIQUID DIET   Foods Allowed                                                                     Foods Excluded  Coffee and tea, regular and decaf                             liquids that you cannot  Plain Jell-O in any flavor                                             see through such as: Fruit ices (not with fruit pulp)                                     milk, soups, orange juice  Iced Popsicles                                    All solid food Carbonated beverages, regular and diet                                    Cranberry, grape and apple juices Sports drinks like Gatorade Lightly seasoned clear broth or consume(fat free) Sugar, honey syrup   _____________________________________________________________________   BRUSH YOUR TEETH MORNING OF SURGERY AND RINSE YOUR MOUTH OUT, NO CHEWING GUM CANDY OR MINTS.     Take these medicines the morning of surgery with A SIP OF WATER: inhaler and bring inhaler  with you , prilosec, metoprolol, claritin,predisone                                You may not have any metal on your body including hair pins and              piercings  Do not wear jewelry, make-up, lotions, powders or perfumes, deodorant             Do not wear nail polish.  Do not shave  48 hours prior  to surgery.     Do not bring valuables to the hospital. [place].  Contacts, dentures or bridgework may not be worn into surgery.  Leave suitcase in the car. After surgery it may be brought to your room.               Please read over the following fact sheets you were given: _____________________________________________________________________           [facility] - Preparing for Surgery Before surgery, you can play an important role.  Because skin is not sterile, your skin needs to be as free of germs as possible.  You can reduce the number of germs on your skin by washing with CHG (chlorahexidine gluconate) soap before surgery.  CHG is an antiseptic cleaner which kills germs and bonds with the skin to continue killing germs even after washing. Please DO NOT use if you have an allergy to CHG or antibacterial soaps.  If your skin becomes reddened/irritated stop using the CHG and inform your nurse when you arrive at Short Stay. Do not shave (including legs and underarms) for at least 48 hours prior to the first CHG shower.  You may shave your face/neck. Please follow these instructions carefully:  1.  Shower with CHG Soap the night before surgery and the  morning of Surgery.  2.  If you choose to wash your hair, wash your hair first as usual with your  normal  shampoo.  3.  After you shampoo, rinse your hair and body thoroughly to remove the  shampoo.                           4.  Use CHG as you would any other liquid soap.  You can apply chg directly  to the skin and wash                       Gently with a scrungie or clean washcloth.  5.  Apply the CHG Soap to your body ONLY FROM THE NECK DOWN.   Do not use on face/ open                           Wound or open sores. Avoid contact with eyes, ears mouth and genitals (private parts).                       Wash face,  Genitals (private parts) with your normal soap.             6.  Wash thoroughly, paying special attention to the area where your surgery  will be  performed.  7.  Thoroughly rinse your body with warm water from the neck down.  8.  DO NOT shower/wash with your normal soap after using and rinsing off  the CHG Soap.                9.  Pat yourself dry with a clean towel.            10.  Wear clean pajamas.            11.  Place clean sheets on your bed the night of your first shower and do not  sleep with pets. Day of Surgery : Do  not apply any lotions/deodorants the morning of surgery.  Please wear clean clothes to the hospital/surgery center.  FAILURE TO FOLLOW THESE INSTRUCTIONS MAY RESULT IN THE CANCELLATION OF YOUR SURGERY PATIENT SIGNATURE_________________________________  NURSE SIGNATURE__________________________________  ________________________________________________________________________  WHAT IS A BLOOD TRANSFUSION? Blood Transfusion Information  A transfusion is the replacement of blood or some of its parts. Blood is made up of multiple cells which provide different functions.  Red blood cells carry oxygen and are used for blood loss replacement.  White blood cells fight against infection.  Platelets control bleeding.  Plasma helps clot blood.  Other blood products are available for specialized needs, such as hemophilia or other clotting disorders. BEFORE THE TRANSFUSION  Who gives blood for transfusions?   Healthy volunteers who are fully evaluated to make sure their blood is safe. This is blood bank blood. Transfusion therapy is the safest it has ever been in the practice of medicine. Before blood is taken from a donor, a complete history is taken to make sure that person has no history of diseases nor engages in risky social behavior (examples are intravenous drug use or sexual activity with multiple partners). The donor's travel history is screened to minimize risk of transmitting infections, such as malaria. The donated blood is tested for signs of infectious diseases, such as HIV and hepatitis. The blood is  then tested to be sure it is compatible with you in order to minimize the chance of a transfusion reaction. If you or a relative donates blood, this is often done in anticipation of surgery and is not appropriate for emergency situations. It takes many days to process the donated blood. RISKS AND COMPLICATIONS Although transfusion therapy is very safe and saves many lives, the main dangers of transfusion include:   Getting an infectious disease.  Developing a transfusion reaction. This is an allergic reaction to something in the blood you were given. Every precaution is taken to prevent this. The decision to have a blood transfusion has been considered carefully by your caregiver before blood is given. Blood is not given unless the benefits outweigh the risks. AFTER THE TRANSFUSION  Right after receiving a blood transfusion, you will usually feel much better and more energetic. This is especially true if your red blood cells have gotten low (anemic). The transfusion raises the level of the red blood cells which carry oxygen, and this usually causes an energy increase.  The nurse administering the transfusion will monitor you carefully for complications. HOME CARE INSTRUCTIONS  No special instructions are needed after a transfusion. You may find your energy is better. Speak with your caregiver about any limitations on activity for underlying diseases you may have. SEEK MEDICAL CARE IF:   Your condition is not improving after your transfusion.  You develop redness or irritation at the intravenous (IV) site. SEEK IMMEDIATE MEDICAL CARE IF:  Any of the following symptoms occur over the next 12 hours:  Shaking chills.  You have a temperature by mouth above 102 F (38.9 C), not controlled by medicine.  Chest, back, or muscle pain.  People around you feel you are not acting correctly or are confused.  Shortness of breath or difficulty breathing.  Dizziness and fainting.  You get a rash  or develop hives.  You have a decrease in urine output.  Your urine turns a dark color or changes to pink, red, or brown. Any of the following symptoms occur over the next 10 days:  You have a temperature by mouth above  102 F (38.9 C), not controlled by medicine.  Shortness of breath.  Weakness after normal activity.  The white part of the eye turns yellow (jaundice).  You have a decrease in the amount of urine or are urinating less often.  Your urine turns a dark color or changes to pink, red, or brown. Document Released: 08/03/2000 Document Revised: 10/29/2011 Document Reviewed: 03/22/2008 [facility] Patient Information 2014 [place], [place].  _______________________________________________________________________

## 2018-06-09 ENCOUNTER — Encounter (HOSPITAL_COMMUNITY)
Admission: RE | Admit: 2018-06-09 | Discharge: 2018-06-09 | Disposition: A | Discharge status: Home / Self Care | Payer: Medicare Other | Source: Ambulatory Visit | Attending: General Surgery | Admitting: General Surgery

## 2018-06-09 ENCOUNTER — Inpatient Hospital Stay: Payer: Medicare Other

## 2018-06-09 ENCOUNTER — Other Ambulatory Visit: Payer: Self-pay

## 2018-06-09 ENCOUNTER — Inpatient Hospital Stay: Payer: Medicare Other | Attending: Nurse Practitioner | Admitting: Oncology

## 2018-06-09 ENCOUNTER — Telehealth: Payer: Self-pay

## 2018-06-09 ENCOUNTER — Encounter (HOSPITAL_COMMUNITY): Payer: Self-pay

## 2018-06-09 VITALS — BP 130/68 | HR 65 | Temp 98.5°F | Resp 18 | Ht 64.0 in | Wt 184.5 lb

## 2018-06-09 DIAGNOSIS — D591 Autoimmune hemolytic anemia, unspecified: Secondary | ICD-10-CM

## 2018-06-09 DIAGNOSIS — M25511 Pain in right shoulder: Secondary | ICD-10-CM | POA: Diagnosis not present

## 2018-06-09 DIAGNOSIS — Z7952 Long term (current) use of systemic steroids: Secondary | ICD-10-CM

## 2018-06-09 DIAGNOSIS — R161 Splenomegaly, not elsewhere classified: Secondary | ICD-10-CM | POA: Diagnosis not present

## 2018-06-09 DIAGNOSIS — Z01812 Encounter for preprocedural laboratory examination: Secondary | ICD-10-CM | POA: Insufficient documentation

## 2018-06-09 HISTORY — DX: Cardiac arrhythmia, unspecified: I49.9

## 2018-06-09 HISTORY — DX: Heart failure, unspecified: I50.9

## 2018-06-09 HISTORY — DX: Unspecified asthma, uncomplicated: J45.909

## 2018-06-09 HISTORY — DX: Dyspnea, unspecified: R06.00

## 2018-06-09 LAB — CBC WITH DIFFERENTIAL (CANCER CENTER ONLY)
ABS IMMATURE GRANULOCYTES: 0.03 10*3/uL (ref 0.00–0.07)
BASOS ABS: 0.1 10*3/uL (ref 0.0–0.1)
Basophils Relative: 1 %
EOS ABS: 0.4 10*3/uL (ref 0.0–0.5)
Eosinophils Relative: 4 %
HEMATOCRIT: 39.4 % (ref 36.0–46.0)
HEMOGLOBIN: 12.7 g/dL (ref 12.0–15.0)
IMMATURE GRANULOCYTES: 0 %
LYMPHS ABS: 1.8 10*3/uL (ref 0.7–4.0)
LYMPHS PCT: 21 %
MCH: 30.7 pg (ref 26.0–34.0)
MCHC: 32.2 g/dL (ref 30.0–36.0)
MCV: 95.2 fL (ref 80.0–100.0)
MONOS PCT: 5 %
Monocytes Absolute: 0.4 10*3/uL (ref 0.1–1.0)
NEUTROS PCT: 69 %
NRBC: 0 % (ref 0.0–0.2)
Neutro Abs: 5.9 10*3/uL (ref 1.7–7.7)
Platelet Count: 259 10*3/uL (ref 150–400)
RBC: 4.14 MIL/uL (ref 3.87–5.11)
RDW: 14 % (ref 11.5–15.5)
WBC Count: 8.6 10*3/uL (ref 4.0–10.5)

## 2018-06-09 LAB — BASIC METABOLIC PANEL
Anion gap: 6 (ref 5–15)
BUN: 14 mg/dL (ref 8–23)
CALCIUM: 8.9 mg/dL (ref 8.9–10.3)
CO2: 31 mmol/L (ref 22–32)
CREATININE: 0.65 mg/dL (ref 0.44–1.00)
Chloride: 104 mmol/L (ref 98–111)
GFR calc non Af Amer: >60 mL/min (ref >60)
Glucose, Bld: 91 mg/dL (ref 70–99)
Potassium: 3.8 mmol/L (ref 3.5–5.1)
SODIUM: 141 mmol/L (ref 135–145)

## 2018-06-09 LAB — RETICULOCYTES
Immature Retic Fract: 14.1 % (ref 2.3–15.9)
RBC.: 4.14 MIL/uL (ref 3.87–5.11)
RETIC COUNT ABSOLUTE: 125.9 10*3/uL (ref 19.0–186.0)
Retic Ct Pct: 3 % (ref 0.4–3.1)

## 2018-06-09 NOTE — Progress Notes (Addendum)
Cbc/diff 06-09-18 in epic  Clearance Dr. Ellyn Hack 03-26-18 in epic

## 2018-06-09 NOTE — Progress Notes (Signed)
[facility] OFFICE PROGRESS NOTE   Diagnosis: Hemolytic anemia  INTERVAL HISTORY:   Michelle Wilcox returns as scheduled.  She continues prednisone at a dose of 10 mg alternating with 12.5 mg daily.  She has developed discomfort in the right shoulder over the past several weeks.  This worsened after she received vaccines in the right arm.  She is scheduled for colectomy on 06/18/2018.  Objective:  Vital signs in last 24 hours:  Blood pressure 130/68, pulse 65, temperature 98.5 F (36.9 C), temperature source Oral, resp. rate 18, height 5\' 4"  (1.626 m), weight 184 lb 8 oz (83.7 kg), SpO2 99 %.    HEENT: No thrush Resp: Coarse end inspiratory rhonchi at the posterior base bilaterally, no respiratory distress Cardio: Regular rate and rhythm with occasional premature beats GI: No hepatosplenomegaly Vascular: No leg edema Musculoskeletal: Discomfort with anterior extension at the right shoulder Skin: 2 areas of subcutaneous nodularity at the right upper arm  Lab Results:  Lab Results  Component Value Date   WBC 8.6 06/09/2018   HGB 12.7 06/09/2018   HCT 39.4 06/09/2018   MCV 95.2 06/09/2018   PLT 259 06/09/2018   NEUTROABS 5.9 06/09/2018    CMP  Lab Results  Component Value Date   NA 145 04/07/2018   K 3.6 04/07/2018   CL 107 04/07/2018   CO2 31 04/07/2018   GLUCOSE 90 04/07/2018   BUN 26 (H) 04/07/2018   CREATININE 0.80 04/07/2018   CALCIUM 9.0 04/07/2018   PROT 5.8 (L) 04/07/2018   ALBUMIN 3.8 04/07/2018   AST 19 04/07/2018   ALT 10 04/07/2018   ALKPHOS 61 04/07/2018   BILITOT 0.5 04/07/2018   GFRNONAA >60 04/07/2018   GFRAA >60 04/07/2018     Medications: I have reviewed the patient's current medications.   Assessment/Plan: 1. 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, DAT IgG positive  Prednisone started 03/20/2016  Tapered to 15 mg daily on 04/30/2016  Hemoglobin lower, elevated LDH 05/21/2016-prednisone  increased to 30 mgdaily  Prednisone taper to 25 mg daily 06/18/2016  Prednisone taper to 20 mg daily 07/16/2016  Prednisone taper to 15 mg daily 08/27/2016  Prednisone taper to 12.5 mg daily 09/25/2016  Hemoglobin lower, prednisone increased to 20 mgdaily on 10/11/2016  Cycle 1 rituximab 11/19/2016  Cycle 2 rituximab 11/26/2016  Cycle 3 rituximab 12/03/2016  Cycle 4 rituximab 12/28/2016  Prednisone taper to 15 mg daily 12/28/2016  Prednisone taper to 12.5 mg daily 01/28/2017  Prednisone taper to 10 mg daily 02/19/2017  Prednisone taper to 7.5 mg daily 04/01/2017  Prednisone taper to 5 mg daily 05/20/2017  Admission with progressive anemia and evidence of hemolysis 08/03/2017  Prednisone resumed at a dose of 60 mg daily  Prednisone taper to 40 mg 08/12/2017  Prednisone taper to 30 mg daily 08/24/2017  Prednisone taper to 20 mg daily 09/03/2017  Prednisone taper to 15 mg daily 09/20/2017  Prednisone taper to 12.5 mg daily 10/14/2017  Pednisone taper to 10 mg daily 11/05/2017  Progressiveanemia 12/16/2017  Prednisone increased to 20 mg daily 12/16/2017  Prednisone decreased to 15 mg daily 01/06/2018  Prednisone decreased to 12.5 mg daily 02/17/2018  Prednisone taper to 12.5 mg alternating with 10 mg daily 03/17/2018  2. History ofExertional dyspnea secondary to #1  3. Mild splenomegaly on CT 03/16/2016  4. History of polymyalgia rheumatica  5. History of giant cell/temporal arteritis-maintained on methotrexate in the remote past, now on low dose prednisone  6.Admission with  leg edema 08/03/2017-diastolic heart failure?, Exacerbated by anemia  7.Right arm superficial phlebitis 03/17/2018, resolved     Disposition: Michelle Wilcox is stable from a hematologic standpoint.  She will continue prednisone at the current dose.  She is scheduled for a splenectomy next week.  She understands there is no guarantee the splenectomy will result in  resolution of the hemolysis The plan is to taper prednisone as tolerated following the splenectomy.  Michelle Wilcox will return for an office and lab visit approximately 3 weeks after the splenectomy procedure.  The areas of cutaneous nodularity to right upper arm likely correspond to injection sites.  She plans to follow-up with orthopedics if the "shoulder "discomfort does not resolve.  Michelle Coder, MD  06/09/2018  9:26 AM

## 2018-06-09 NOTE — Telephone Encounter (Signed)
Printed avs and calender of upcoming appointment. Per 10/21 los 

## 2018-06-10 ENCOUNTER — Other Ambulatory Visit: Payer: Self-pay | Admitting: *Deleted

## 2018-06-10 DIAGNOSIS — D591 Autoimmune hemolytic anemia, unspecified: Secondary | ICD-10-CM

## 2018-06-10 LAB — TYPE AND SCREEN
ABO/RH(D): O POS
ANTIBODY SCREEN: POSITIVE
DAT, IGG: POSITIVE

## 2018-06-10 MED ORDER — PREDNISONE 10 MG PO TABS: 10.0000 mg | ORAL_TABLET | Freq: Every day | ORAL | 1 refills | Status: DC

## 2018-06-17 MED ORDER — GENTAMICIN SULFATE 40 MG/ML IJ SOLN
5.0000 mg/kg | INTRAVENOUS | Status: AC
  Administered 2018-06-18: 331.2 mg via INTRAVENOUS
  Filled 2018-06-17: qty 8.25

## 2018-06-17 MED ORDER — BUPIVACAINE LIPOSOME 1.3 % IJ SUSP
20.0000 mL | INTRAMUSCULAR | Status: DC
  Filled 2018-06-17: qty 20

## 2018-06-17 NOTE — H&P (Signed)
History of Present Illness  The patient is a 72 year old female who presents with a splenic disorder. Patient is a very pleasant 72 year old female referred by Dr. [name] for consideration for splenectomy for hemolytic anemia. The patient presented almost exactly 2 years ago with significant anemia and secondary congestive heart failure. She had a hemoglobin of approximately 8. The patient has been through several rounds of treatment including several prednisone tapers and rituximab. She will have temporary improvement and feel well but more recent prednisone taper is have not been as effective. CT scan in July 2017 showed mild splenomegaly at 14 cm. She also has a history of polymyalgia rheumatica and temporal arteritis which has been well controlled on low-dose prednisone for a number of years. The patient is currently undergoing a potential preoperative cardiac evaluation and has a stress test scheduled for later this week. She gets some shortness of breath particularly in the heat with exertion but no recent chest pain or swelling or shortness of breath. Denies abdominal pain. She has not had any previous abdominal surgery.   Past Surgical History  Breast Biopsy  Right. Knee Surgery  Right. Oral Surgery  Tonsillectomy   Diagnostic Studies History  Colonoscopy  5-10 years ago Mammogram  1-3 years ago Pap Smear  1-5 years ago  Allergies  Ceftin *CEPHALOSPORINS*  PenicillAMINE *MISCELLANEOUS THERAPEUTIC CLASSES*  Ciprofloxacin *CHEMICALS*  Darvocet A500 *ANALGESICS - OPIOID*  Levaquin *FLUOROQUINOLONES*  Sulfacetamide *CHEMICALS*  ZyrTEC *ANTIHISTAMINES*  Allergies Reconciled   Medication History  Folic Acid (1MG  Tablet, Oral) Active. PredniSONE (2.5MG  Tablet, Oral) Active. PredniSONE (10MG  Tablet, Oral) Active. Claritin (10MG  Capsule, Oral) Active. Metoprolol Tartrate (50MG  Tablet, Oral) Active. RiTUXimab (100MG /10ML Solution, Intravenous)  Active. Gas-X (80MG  Tablet Chewable, Oral) Active. Ventolin HFA (108 (90 Base)MCG/ACT Aerosol Soln, Inhalation) Active. Vitamin D2 (2000UNIT Tablet, Oral) Active. Potassium Chloride (20MEQ Packet, Oral) Active. Multi-Vitamin (Oral) Active. Omeprazole (20MG  Capsule DR, Oral) Active. Tylenol (325MG  Tablet, Oral two times daily) Active. Lasix (40MG  Tablet, Oral) Active. Medications Reconciled  Social History  Alcohol use  Moderate alcohol use. Caffeine use  Carbonated beverages. No drug use  Tobacco use  Never smoker.  Family History  Alcohol Abuse  Brother, Mother, Sister. Arthritis  Brother, Father. Cerebrovascular Accident  Brother. Colon Cancer  Father. Depression  Brother, Mother. Diabetes Mellitus  Mother. Heart Disease  Brother, Father, Mother. Hypertension  Brother, Father, Mother. Melanoma  Father. Respiratory Condition  Mother.  Pregnancy / Birth History  Age at menarche  38 years. Age of menopause  30-50 Gravida  1 Maternal age  3-30 Para  1 Regular periods   Other Problems  Anxiety Disorder  Arthritis  Asthma  Congestive Heart Failure  Depression  High blood pressure     Review of Systems  General Not Present- Appetite Loss, Chills, Fatigue, Fever, Night Sweats, Weight Gain and Weight Loss. Skin Not Present- Change in Wart/Mole, Dryness, Hives, Jaundice, New Lesions, Non-Healing Wounds, Rash and Ulcer. HEENT Present- Seasonal Allergies and Wears glasses/contact lenses. Not Present- Earache, Hearing Loss, Hoarseness, Nose Bleed, Oral Ulcers, Ringing in the Ears, Sinus Pain, Sore Throat, Visual Disturbances and Yellow Eyes. Respiratory Not Present- Bloody sputum, Chronic Cough, Difficulty Breathing, Snoring and Wheezing. Breast Not Present- Breast Mass, Breast Pain, Nipple Discharge and Skin Changes. Cardiovascular Present- Shortness of Breath. Not Present- Chest Pain, Difficulty Breathing Lying Down, Leg Cramps,  Palpitations, Rapid Heart Rate and Swelling of Extremities. Gastrointestinal Not Present- Abdominal Pain, Bloating, Bloody Stool, Change in Bowel Habits, Chronic diarrhea, Constipation, Difficulty  Swallowing, Excessive gas, Gets full quickly at meals, Hemorrhoids, Indigestion, Nausea, Rectal Pain and Vomiting. Female Genitourinary Present- Frequency and Urgency. Not Present- Nocturia, Painful Urination and Pelvic Pain. Musculoskeletal Present- Joint Pain and Joint Stiffness. Not Present- Back Pain, Muscle Pain, Muscle Weakness and Swelling of Extremities. Neurological Present- Numbness. Not Present- Decreased Memory, Fainting, Headaches, Seizures, Tingling, Tremor, Trouble walking and Weakness. Psychiatric Not Present- Anxiety, Bipolar, Change in Sleep Pattern, Depression, Fearful and Frequent crying. Endocrine Present- Heat Intolerance. Not Present- Cold Intolerance, Excessive Hunger, Hair Changes, Hot flashes and New Diabetes. Hematology Present- Easy Bruising. Not Present- Blood Thinners, Excessive bleeding, Gland problems, HIV and Persistent Infections.  Vitals   Weight: 189.6 lb Height: 64in Body Surface Area: 1.91 m Body Mass Index: 32.54 kg/m  Temp.: 98.86F  Pulse: 93 (Regular)  BP: 118/78 (Sitting, Left Arm, Standard)       Physical Exam  The physical exam findings are as follows: Note:General: Mildly overweight alert Caucasian female, slight moon face ease Skin: Warm and dry without rash or infection. HEENT: No palpable masses or thyromegaly. Sclera nonicteric. Pupils equal round and reactive. Oropharynx clear. Lymph nodes: No cervical, supraclavicular, or inguinal nodes palpable. Lungs: Breath sounds clear and equal. No wheezing or increased work of breathing. Cardiovascular: Regular rate and rhythm without murmer. No JVD or edema. Peripheral pulses intact. Abdomen: Nondistended. Soft and nontender. No masses palpable. No palpable splenomegaly or hepatomegaly. No  palpable hernias. Extremities: No edema or joint swelling or deformity. No chronic venous stasis changes. Neurologic: Alert and fully oriented. Gait normal. No focal weakness. Psychiatric: Normal mood and affect. Thought content appropriate with normal judgement and insight    Assessment & Plan  ANEMIA, HEMOLYTIC, AUTOIMMUNE (D59.1) Impression: 72 year old female with two-year history of autoimmune hemolytic anemia. Initially responded to prednisone tapers but now has gone through rituximab and further prednisone tapers with decreasing response. Followed closely by Dr. Benay Spice who has recommended splenectomy as the next step. Patient does have some cardiac history presenting with some heart failure at the time of her diagnosis of anemia and cardiac workup is in progress. We discussed laparoscopic splenectomy in detail. I discussed the indications for the procedure. We discussed that the response to splenectomy is somewhat difficult to predict particularly with her situation and I would put a rough gas at 50/50 for good long-term response. We discussed possible complications including anesthetic complications, bleeding, infection, blood clots, visceral injury. We will await cardiac workup. We discussed need for vaccinations preoperatively and some increased risk for infections postsplenectomy. She has been difficult or impossible to cross match and I will discuss with Dr. Benay Spice whether uncross matched blood or possibly taking her own blood would be options. Current Plans I recommended obtaining preoperative cardiac clearance. I am concerned about the health of the patient and the ability to tolerate the operation. Therefore, we will request clearance by cardiology to better assess operative risk & see if a reevaluation, further workup, etc is needed. Also recommendations on how medications such as for anticoagulation and blood pressure should be managed/held/restarted after surgery. Laparoscopic  splenectomy under general anesthesia with at least overnight hospitalization pending cardiac clearance

## 2018-06-17 NOTE — Anesthesia Preprocedure Evaluation (Addendum)
Anesthesia Evaluation  Patient identified by MRN, date of birth, ID band Patient awake    Reviewed: Allergy & Precautions, NPO status , Patient's Chart, lab work & pertinent test results  Airway Mallampati: III  TM Distance: >3 FB Neck ROM: Full    Dental no notable dental hx.    Pulmonary asthma ,    Pulmonary exam normal breath sounds clear to auscultation       Cardiovascular hypertension, Pt. on home beta blockers +CHF  Normal cardiovascular exam Rhythm:Regular Rate:Normal  ECG: NSR, rate 83  ECHO: LV EF: 60% -   65%  Myocardial perfusion The left ventricular ejection fraction is hyperdynamic (>65%). Nuclear stress EF: 69%. There was no ST segment deviation noted during stress. The study is normal. This is a low risk study. Low risk stress nuclear study with normal perfusion and normal left ventricular regional and global systolic function.  Pre-op eval per cardiology Ellyn Hack)   Neuro/Psych  Headaches, PSYCHIATRIC DISORDERS Anxiety Depression    GI/Hepatic negative GI ROS, Neg liver ROS,   Endo/Other  negative endocrine ROS  Renal/GU negative Renal ROS     Musculoskeletal Polymyalgia    Abdominal (+) + obese,   Peds  Hematology HLD   Anesthesia Other Findings autoimmune anemia  Reproductive/Obstetrics                            Anesthesia Physical Anesthesia Plan  ASA: III  Anesthesia Plan: General   Post-op Pain Management:    Induction: Intravenous  PONV Risk Score and Plan: 4 or greater and Midazolam, Dexamethasone, Ondansetron and Treatment may vary due to age or medical condition  Airway Management Planned: Oral ETT  Additional Equipment: Arterial line  Intra-op Plan:   Post-operative Plan: Extubation in OR  Informed Consent: I have reviewed the patients History and Physical, chart, labs and discussed the procedure including the risks, benefits and  alternatives for the proposed anesthesia with the patient or authorized representative who has indicated his/her understanding and acceptance.   Dental advisory given  Plan Discussed with: CRNA  Anesthesia Plan Comments:       Anesthesia Quick Evaluation

## 2018-06-18 ENCOUNTER — Inpatient Hospital Stay (HOSPITAL_COMMUNITY): Payer: Medicare Other | Admitting: Anesthesiology

## 2018-06-18 ENCOUNTER — Encounter (HOSPITAL_COMMUNITY)
Admission: RE | Disposition: A | Discharge status: Home / Self Care | Payer: Self-pay | Source: Ambulatory Visit | Attending: General Surgery

## 2018-06-18 ENCOUNTER — Observation Stay (HOSPITAL_COMMUNITY)
Admission: RE | Admit: 2018-06-18 | Discharge: 2018-06-19 | Disposition: A | Discharge status: Home / Self Care | Payer: Medicare Other | Source: Ambulatory Visit | Attending: General Surgery | Admitting: General Surgery

## 2018-06-18 ENCOUNTER — Other Ambulatory Visit: Payer: Self-pay

## 2018-06-18 ENCOUNTER — Encounter (HOSPITAL_COMMUNITY): Payer: Self-pay | Admitting: Certified Registered Nurse Anesthetist

## 2018-06-18 DIAGNOSIS — Z79899 Other long term (current) drug therapy: Secondary | ICD-10-CM | POA: Insufficient documentation

## 2018-06-18 DIAGNOSIS — D6489 Other specified anemias: Secondary | ICD-10-CM | POA: Diagnosis not present

## 2018-06-18 DIAGNOSIS — D591 Autoimmune hemolytic anemia, unspecified: Secondary | ICD-10-CM | POA: Diagnosis present

## 2018-06-18 DIAGNOSIS — Z881 Allergy status to other antibiotic agents status: Secondary | ICD-10-CM | POA: Diagnosis not present

## 2018-06-18 DIAGNOSIS — R030 Elevated blood-pressure reading, without diagnosis of hypertension: Secondary | ICD-10-CM | POA: Insufficient documentation

## 2018-06-18 DIAGNOSIS — Z882 Allergy status to sulfonamides status: Secondary | ICD-10-CM | POA: Insufficient documentation

## 2018-06-18 DIAGNOSIS — D7389 Other diseases of spleen: Secondary | ICD-10-CM | POA: Diagnosis not present

## 2018-06-18 DIAGNOSIS — Z885 Allergy status to narcotic agent status: Secondary | ICD-10-CM | POA: Insufficient documentation

## 2018-06-18 DIAGNOSIS — Z886 Allergy status to analgesic agent status: Secondary | ICD-10-CM | POA: Diagnosis not present

## 2018-06-18 DIAGNOSIS — D732 Chronic congestive splenomegaly: Secondary | ICD-10-CM | POA: Diagnosis not present

## 2018-06-18 DIAGNOSIS — F419 Anxiety disorder, unspecified: Secondary | ICD-10-CM | POA: Insufficient documentation

## 2018-06-18 DIAGNOSIS — I509 Heart failure, unspecified: Secondary | ICD-10-CM | POA: Diagnosis not present

## 2018-06-18 HISTORY — PX: LAPAROSCOPIC SPLENECTOMY: SHX409

## 2018-06-18 LAB — TYPE AND SCREEN
ABO/RH(D): O POS
Antibody Screen: POSITIVE

## 2018-06-18 SURGERY — SPLENECTOMY, LAPAROSCOPIC
Anesthesia: General

## 2018-06-18 MED ORDER — LOPERAMIDE HCL 2 MG PO CAPS: 2.0000 mg | ORAL_CAPSULE | ORAL | Status: DC | PRN

## 2018-06-18 MED ORDER — SUGAMMADEX SODIUM 500 MG/5ML IV SOLN
INTRAVENOUS | Status: DC | PRN
  Administered 2018-06-18: 300 mg via INTRAVENOUS

## 2018-06-18 MED ORDER — BUPIVACAINE-EPINEPHRINE 0.5% -1:200000 IJ SOLN
INTRAMUSCULAR | Status: AC
  Filled 2018-06-18: qty 1

## 2018-06-18 MED ORDER — POTASSIUM CHLORIDE CRYS ER 10 MEQ PO TBCR
20.0000 meq | EXTENDED_RELEASE_TABLET | Freq: Every day | ORAL | Status: DC
  Administered 2018-06-18: 20 meq via ORAL
  Filled 2018-06-18 (×2): qty 2

## 2018-06-18 MED ORDER — LACTASE 3000 UNITS PO TABS
6000.0000 [IU] | ORAL_TABLET | ORAL | Status: DC | PRN
  Administered 2018-06-19: 9000 [IU] via ORAL
  Filled 2018-06-18 (×3): qty 3
  Filled 2018-06-18: qty 1

## 2018-06-18 MED ORDER — LACTATED RINGERS IV SOLN
INTRAVENOUS | Status: DC | PRN
  Administered 2018-06-18: 08:00:00 via INTRAVENOUS

## 2018-06-18 MED ORDER — FENTANYL CITRATE (PF) 100 MCG/2ML IJ SOLN
25.0000 ug | INTRAMUSCULAR | Status: DC | PRN
  Administered 2018-06-18: 50 ug via INTRAVENOUS

## 2018-06-18 MED ORDER — EPHEDRINE SULFATE-NACL 50-0.9 MG/10ML-% IV SOSY
PREFILLED_SYRINGE | INTRAVENOUS | Status: DC | PRN
  Administered 2018-06-18: 15 mg via INTRAVENOUS

## 2018-06-18 MED ORDER — DEXAMETHASONE SODIUM PHOSPHATE 10 MG/ML IJ SOLN
INTRAMUSCULAR | Status: DC | PRN
  Administered 2018-06-18: 10 mg via INTRAVENOUS

## 2018-06-18 MED ORDER — ONDANSETRON HCL 4 MG/2ML IJ SOLN: 4.0000 mg | Freq: Four times a day (QID) | INTRAMUSCULAR | Status: DC | PRN

## 2018-06-18 MED ORDER — MORPHINE SULFATE (PF) 2 MG/ML IV SOLN: 2.0000 mg | INTRAVENOUS | Status: DC | PRN

## 2018-06-18 MED ORDER — ONDANSETRON HCL 4 MG/2ML IJ SOLN
INTRAMUSCULAR | Status: DC | PRN
  Administered 2018-06-18: 4 mg via INTRAVENOUS

## 2018-06-18 MED ORDER — BUPIVACAINE LIPOSOME 1.3 % IJ SUSP
INTRAMUSCULAR | Status: DC | PRN
  Administered 2018-06-18: 20 mL

## 2018-06-18 MED ORDER — MIDAZOLAM HCL 5 MG/5ML IJ SOLN
INTRAMUSCULAR | Status: DC | PRN
  Administered 2018-06-18: 2 mg via INTRAVENOUS

## 2018-06-18 MED ORDER — VANCOMYCIN HCL IN DEXTROSE 1-5 GM/200ML-% IV SOLN
1000.0000 mg | INTRAVENOUS | Status: AC
  Administered 2018-06-18: 1000 mg via INTRAVENOUS
  Filled 2018-06-18: qty 200

## 2018-06-18 MED ORDER — ONDANSETRON HCL 4 MG/2ML IJ SOLN
INTRAMUSCULAR | Status: AC
  Filled 2018-06-18: qty 2

## 2018-06-18 MED ORDER — SUGAMMADEX SODIUM 500 MG/5ML IV SOLN
INTRAVENOUS | Status: AC
  Filled 2018-06-18: qty 5

## 2018-06-18 MED ORDER — LIDOCAINE 2% (20 MG/ML) 5 ML SYRINGE
INTRAMUSCULAR | Status: AC
  Filled 2018-06-18: qty 5

## 2018-06-18 MED ORDER — ACETAMINOPHEN 325 MG PO TABS
650.0000 mg | ORAL_TABLET | Freq: Four times a day (QID) | ORAL | Status: DC
  Administered 2018-06-18 – 2018-06-19 (×4): 650 mg via ORAL
  Filled 2018-06-18 (×4): qty 2

## 2018-06-18 MED ORDER — CHLORHEXIDINE GLUCONATE CLOTH 2 % EX PADS: 6.0000 | MEDICATED_PAD | Freq: Once | CUTANEOUS | Status: DC

## 2018-06-18 MED ORDER — SODIUM CHLORIDE 0.9 % IJ SOLN
INTRAMUSCULAR | Status: DC | PRN
  Administered 2018-06-18: 50 mL

## 2018-06-18 MED ORDER — LACTATED RINGERS IV SOLN
Freq: Once | INTRAVENOUS | Status: AC
  Administered 2018-06-18: 07:00:00 via INTRAVENOUS

## 2018-06-18 MED ORDER — SODIUM CHLORIDE 0.9 % IV SOLN
INTRAVENOUS | Status: DC | PRN
  Administered 2018-06-18: 100 ug/min via INTRAVENOUS

## 2018-06-18 MED ORDER — FENTANYL CITRATE (PF) 250 MCG/5ML IJ SOLN
INTRAMUSCULAR | Status: DC | PRN
  Administered 2018-06-18: 50 ug via INTRAVENOUS
  Administered 2018-06-18: 100 ug via INTRAVENOUS
  Administered 2018-06-18 (×2): 50 ug via INTRAVENOUS

## 2018-06-18 MED ORDER — BUPIVACAINE HCL (PF) 0.25 % IJ SOLN
INTRAMUSCULAR | Status: DC | PRN
  Administered 2018-06-18: 30 mL

## 2018-06-18 MED ORDER — METOPROLOL TARTRATE 25 MG PO TABS: 25.0000 mg | ORAL_TABLET | ORAL | Status: DC

## 2018-06-18 MED ORDER — PROPOFOL 10 MG/ML IV BOLUS
INTRAVENOUS | Status: AC
  Filled 2018-06-18: qty 20

## 2018-06-18 MED ORDER — SIMETHICONE 80 MG PO CHEW: 80.0000 mg | CHEWABLE_TABLET | Freq: Four times a day (QID) | ORAL | Status: DC | PRN

## 2018-06-18 MED ORDER — LIDOCAINE 2% (20 MG/ML) 5 ML SYRINGE
INTRAMUSCULAR | Status: DC | PRN
  Administered 2018-06-18: 60 mg via INTRAVENOUS

## 2018-06-18 MED ORDER — OXYCODONE HCL 5 MG PO TABS: 5.0000 mg | ORAL_TABLET | ORAL | Status: DC | PRN

## 2018-06-18 MED ORDER — METOPROLOL TARTRATE 50 MG PO TABS
50.0000 mg | ORAL_TABLET | Freq: Every morning | ORAL | Status: DC
  Administered 2018-06-19: 50 mg via ORAL
  Filled 2018-06-18: qty 1

## 2018-06-18 MED ORDER — SODIUM CHLORIDE 0.9 % IJ SOLN
INTRAMUSCULAR | Status: AC
  Filled 2018-06-18: qty 10

## 2018-06-18 MED ORDER — LIDOCAINE 2% (20 MG/ML) 5 ML SYRINGE
INTRAMUSCULAR | Status: DC | PRN
  Administered 2018-06-18: 1.5 mg/kg/h via INTRAVENOUS

## 2018-06-18 MED ORDER — ONDANSETRON 4 MG PO TBDP: 4.0000 mg | ORAL_TABLET | Freq: Four times a day (QID) | ORAL | Status: DC | PRN

## 2018-06-18 MED ORDER — ALBUTEROL SULFATE (2.5 MG/3ML) 0.083% IN NEBU: 3.0000 mL | INHALATION_SOLUTION | RESPIRATORY_TRACT | Status: DC | PRN

## 2018-06-18 MED ORDER — SODIUM CHLORIDE 0.9 % IJ SOLN
INTRAMUSCULAR | Status: AC
  Filled 2018-06-18: qty 50

## 2018-06-18 MED ORDER — EPHEDRINE 5 MG/ML INJ
INTRAVENOUS | Status: AC
  Filled 2018-06-18: qty 10

## 2018-06-18 MED ORDER — PREDNISONE 10 MG PO TABS
10.0000 mg | ORAL_TABLET | Freq: Every day | ORAL | Status: DC
  Administered 2018-06-19: 10 mg via ORAL
  Filled 2018-06-18: qty 1

## 2018-06-18 MED ORDER — PREDNISONE 5 MG PO TABS: 2.5000 mg | ORAL_TABLET | ORAL | Status: DC

## 2018-06-18 MED ORDER — ONDANSETRON HCL 4 MG/2ML IJ SOLN: 4.0000 mg | Freq: Once | INTRAMUSCULAR | Status: DC | PRN

## 2018-06-18 MED ORDER — METOPROLOL TARTRATE 25 MG PO TABS
25.0000 mg | ORAL_TABLET | Freq: Every day | ORAL | Status: DC
  Administered 2018-06-18: 25 mg via ORAL
  Filled 2018-06-18: qty 1

## 2018-06-18 MED ORDER — POTASSIUM CHLORIDE IN NACL 20-0.9 MEQ/L-% IV SOLN
INTRAVENOUS | Status: DC
  Administered 2018-06-18: 14:00:00 via INTRAVENOUS
  Filled 2018-06-18: qty 1000

## 2018-06-18 MED ORDER — FUROSEMIDE 20 MG PO TABS: 20.0000 mg | ORAL_TABLET | Freq: Every day | ORAL | Status: DC

## 2018-06-18 MED ORDER — ACETAMINOPHEN ER 650 MG PO TBCR: 1300.0000 mg | EXTENDED_RELEASE_TABLET | Freq: Two times a day (BID) | ORAL | Status: DC

## 2018-06-18 MED ORDER — FOLIC ACID 1 MG PO TABS
1.0000 mg | ORAL_TABLET | Freq: Every day | ORAL | Status: DC
  Administered 2018-06-18 – 2018-06-19 (×2): 1 mg via ORAL
  Filled 2018-06-18 (×2): qty 1

## 2018-06-18 MED ORDER — ADULT MULTIVITAMIN W/MINERALS CH
1.0000 | ORAL_TABLET | Freq: Every evening | ORAL | Status: DC
  Administered 2018-06-18: 1 via ORAL
  Filled 2018-06-18: qty 1

## 2018-06-18 MED ORDER — OMEPRAZOLE 20 MG PO CPDR
20.0000 mg | DELAYED_RELEASE_CAPSULE | ORAL | Status: DC
  Administered 2018-06-19: 20 mg via ORAL
  Filled 2018-06-18: qty 1

## 2018-06-18 MED ORDER — GABAPENTIN 300 MG PO CAPS
300.0000 mg | ORAL_CAPSULE | ORAL | Status: AC
  Administered 2018-06-18: 300 mg via ORAL
  Filled 2018-06-18: qty 1

## 2018-06-18 MED ORDER — EVICEL 5 ML EX KIT
PACK | Freq: Once | CUTANEOUS | Status: AC
  Administered 2018-06-18: 1
  Filled 2018-06-18: qty 1

## 2018-06-18 MED ORDER — MIDAZOLAM HCL 2 MG/2ML IJ SOLN
INTRAMUSCULAR | Status: AC
  Filled 2018-06-18: qty 2

## 2018-06-18 MED ORDER — PROPOFOL 10 MG/ML IV BOLUS
INTRAVENOUS | Status: DC | PRN
  Administered 2018-06-18: 100 mg via INTRAVENOUS

## 2018-06-18 MED ORDER — LIDOCAINE HCL 2 % IJ SOLN
INTRAMUSCULAR | Status: AC
  Filled 2018-06-18: qty 20

## 2018-06-18 MED ORDER — ROCURONIUM BROMIDE 100 MG/10ML IV SOLN
INTRAVENOUS | Status: AC
  Filled 2018-06-18: qty 1

## 2018-06-18 MED ORDER — KETAMINE HCL 10 MG/ML IJ SOLN
INTRAMUSCULAR | Status: AC
  Filled 2018-06-18: qty 1

## 2018-06-18 MED ORDER — ROCURONIUM BROMIDE 100 MG/10ML IV SOLN
INTRAVENOUS | Status: DC | PRN
  Administered 2018-06-18: 50 mg via INTRAVENOUS
  Administered 2018-06-18: 20 mg via INTRAVENOUS

## 2018-06-18 MED ORDER — FENTANYL CITRATE (PF) 250 MCG/5ML IJ SOLN
INTRAMUSCULAR | Status: AC
  Filled 2018-06-18: qty 5

## 2018-06-18 MED ORDER — DEXAMETHASONE SODIUM PHOSPHATE 10 MG/ML IJ SOLN
INTRAMUSCULAR | Status: AC
  Filled 2018-06-18: qty 1

## 2018-06-18 MED ORDER — FENTANYL CITRATE (PF) 100 MCG/2ML IJ SOLN
INTRAMUSCULAR | Status: AC
  Filled 2018-06-18: qty 4

## 2018-06-18 MED ORDER — BUPIVACAINE HCL (PF) 0.25 % IJ SOLN
INTRAMUSCULAR | Status: AC
  Filled 2018-06-18: qty 30

## 2018-06-18 MED ORDER — ENOXAPARIN SODIUM 40 MG/0.4ML ~~LOC~~ SOLN
40.0000 mg | SUBCUTANEOUS | Status: DC
  Administered 2018-06-19: 40 mg via SUBCUTANEOUS
  Filled 2018-06-18: qty 0.4

## 2018-06-18 MED ORDER — PHENYLEPHRINE 40 MCG/ML (10ML) SYRINGE FOR IV PUSH (FOR BLOOD PRESSURE SUPPORT)
PREFILLED_SYRINGE | INTRAVENOUS | Status: DC | PRN
  Administered 2018-06-18 (×4): 80 ug via INTRAVENOUS

## 2018-06-18 MED ORDER — KETAMINE HCL 10 MG/ML IJ SOLN
INTRAMUSCULAR | Status: DC | PRN
  Administered 2018-06-18: 30 mg via INTRAVENOUS

## 2018-06-18 MED ORDER — PHENYLEPHRINE 40 MCG/ML (10ML) SYRINGE FOR IV PUSH (FOR BLOOD PRESSURE SUPPORT)
PREFILLED_SYRINGE | INTRAVENOUS | Status: AC
  Filled 2018-06-18: qty 10

## 2018-06-18 MED ORDER — RISAQUAD PO CAPS
1.0000 | ORAL_CAPSULE | ORAL | Status: DC
  Administered 2018-06-19: 1 via ORAL

## 2018-06-18 SURGICAL SUPPLY — 85 items
ADH SKN CLS APL DERMABOND .7 (GAUZE/BANDAGES/DRESSINGS) ×2
APL SRG 32X5 SNPLK LF DISP (MISCELLANEOUS)
APPLIER CLIP ROT 13.4 12 LRG (CLIP)
APR CLP LRG 13.4X12 ROT 20 MLT (CLIP)
BAG LAPAROSCOPIC 12 15 PORT 16 (BASKET) IMPLANT
BAG RETRIEVAL 12/15 (BASKET) ×2
BAG RETRIEVAL 12/15MM (BASKET) ×1
BAG SPEC RTRVL LRG 6X4 10 (ENDOMECHANICALS)
BIOPATCH WHT 1IN DISK W/4.0 H (GAUZE/BANDAGES/DRESSINGS) IMPLANT
BLADE 11 SAFETY STRL DISP (BLADE) ×2 IMPLANT
BLADE SURG 15 STRL LF DISP TIS (BLADE) ×1 IMPLANT
BLADE SURG 15 STRL SS (BLADE) ×3
CHLORAPREP W/TINT 26ML (MISCELLANEOUS) ×3 IMPLANT
CLIP APPLIE ROT 13.4 12 LRG (CLIP) IMPLANT
CLIP SUT LAPRA TY ABSORB (SUTURE) IMPLANT
CLOSURE WOUND 1/2 X4 (GAUZE/BANDAGES/DRESSINGS)
COVER SURGICAL LIGHT HANDLE (MISCELLANEOUS) ×3 IMPLANT
COVER WAND RF STERILE (DRAPES) ×2 IMPLANT
DECANTER SPIKE VIAL GLASS SM (MISCELLANEOUS) ×5 IMPLANT
DERMABOND ADVANCED (GAUZE/BANDAGES/DRESSINGS) ×4
DERMABOND ADVANCED .7 DNX12 (GAUZE/BANDAGES/DRESSINGS) IMPLANT
DEVICE SUTURE ENDOST 10MM (ENDOMECHANICALS) IMPLANT
DISSECTOR BLUNT TIP ENDO 5MM (MISCELLANEOUS) IMPLANT
DRAIN CHANNEL 19F RND (DRAIN) IMPLANT
DRAIN PENROSE 18X1/4 LTX STRL (WOUND CARE) IMPLANT
DRSG PAD ABDOMINAL 8X10 ST (GAUZE/BANDAGES/DRESSINGS) IMPLANT
ELECT PENCIL ROCKER SW 15FT (MISCELLANEOUS) ×3 IMPLANT
ELECT REM PT RETURN 15FT ADLT (MISCELLANEOUS) ×3 IMPLANT
EVACUATOR SILICONE 100CC (DRAIN) IMPLANT
GAUZE 4X4 16PLY RFD (DISPOSABLE) ×3 IMPLANT
GAUZE SPONGE 4X4 12PLY STRL (GAUZE/BANDAGES/DRESSINGS) ×3 IMPLANT
GLOVE BIOGEL PI IND STRL 7.0 (GLOVE) IMPLANT
GLOVE BIOGEL PI IND STRL 7.5 (GLOVE) ×1 IMPLANT
GLOVE BIOGEL PI INDICATOR 7.0 (GLOVE) ×6
GLOVE BIOGEL PI INDICATOR 7.5 (GLOVE) ×2
GLOVE ECLIPSE 6.5 STRL STRAW (GLOVE) ×2 IMPLANT
GLOVE ECLIPSE 7.5 STRL STRAW (GLOVE) ×3 IMPLANT
GOWN STRL REUS W/ TWL LRG LVL3 (GOWN DISPOSABLE) IMPLANT
GOWN STRL REUS W/TWL LRG LVL3 (GOWN DISPOSABLE) ×6
GOWN STRL REUS W/TWL XL LVL3 (GOWN DISPOSABLE) ×6 IMPLANT
HEMOSTAT ARISTA ABSORB 3G PWDR (MISCELLANEOUS) IMPLANT
HEMOSTAT SURGICEL 4X8 (HEMOSTASIS) IMPLANT
KIT BASIN OR (CUSTOM PROCEDURE TRAY) ×3 IMPLANT
NEEDLE HYPO 22GX1.5 SAFETY (NEEDLE) ×3 IMPLANT
PACK CARDIOVASCULAR III (CUSTOM PROCEDURE TRAY) ×3 IMPLANT
POUCH SPECIMEN RETRIEVAL 10MM (ENDOMECHANICALS) IMPLANT
RELOAD ENDO STITCH 2.0 (ENDOMECHANICALS)
RELOAD STAPLE 60 2.6 WHT THN (STAPLE) IMPLANT
RELOAD STAPLE 60 3.6 BLU REG (STAPLE) IMPLANT
RELOAD STAPLER BLUE 60MM (STAPLE) IMPLANT
RELOAD STAPLER WHITE 60MM (STAPLE) ×2 IMPLANT
RELOAD SUT SNGL STCH ABSRB 2-0 (ENDOMECHANICALS) IMPLANT
RELOAD SUT SNGL STCH BLK 2-0 (ENDOMECHANICALS) IMPLANT
SCISSORS LAP 5X35 DISP (ENDOMECHANICALS) ×3 IMPLANT
SEALANT SURGICAL APPL DUAL CAN (MISCELLANEOUS) ×1 IMPLANT
SET IRRIG TUBING LAPAROSCOPIC (IRRIGATION / IRRIGATOR) ×3 IMPLANT
SHEARS HARMONIC ACE PLUS 36CM (ENDOMECHANICALS) ×3 IMPLANT
SLEEVE XCEL OPT CAN 5 100 (ENDOMECHANICALS) ×4 IMPLANT
SOLUTION ANTI FOG 6CC (MISCELLANEOUS) ×3 IMPLANT
SPONGE LAP 18X18 RF (DISPOSABLE) ×2 IMPLANT
STAPLER ECHELON LONG 60 440 (INSTRUMENTS) ×2 IMPLANT
STAPLER RELOAD BLUE 60MM (STAPLE)
STAPLER RELOAD WHITE 60MM (STAPLE) ×6
STAPLER VISISTAT 35W (STAPLE) ×1 IMPLANT
STRIP CLOSURE SKIN 1/2X4 (GAUZE/BANDAGES/DRESSINGS) IMPLANT
SUT ETHILON 3 0 PS 1 (SUTURE) ×1 IMPLANT
SUT MNCRL AB 4-0 PS2 18 (SUTURE) ×5 IMPLANT
SUT PDS AB 0 CT1 36 (SUTURE) ×4 IMPLANT
SUT PDS AB 1 TP1 96 (SUTURE) IMPLANT
SUT RELOAD ENDO STITCH 2 48X1 (ENDOMECHANICALS)
SUT RELOAD ENDO STITCH 2.0 (ENDOMECHANICALS)
SUT VIC AB 4-0 SH 18 (SUTURE) ×3 IMPLANT
SUTURE RELOAD END STTCH 2 48X1 (ENDOMECHANICALS) IMPLANT
SUTURE RELOAD ENDO STITCH 2.0 (ENDOMECHANICALS) IMPLANT
SYR 20CC LL (SYRINGE) ×3 IMPLANT
TIP RIGID 35CM EVICEL (HEMOSTASIS) ×2 IMPLANT
TOWEL OR 17X26 10 PK STRL BLUE (TOWEL DISPOSABLE) ×3 IMPLANT
TOWEL OR NON WOVEN STRL DISP B (DISPOSABLE) ×3 IMPLANT
TRAY FOLEY W/BAG SLVR 14FR (SET/KITS/TRAYS/PACK) ×2 IMPLANT
TROCAR BLADELESS OPT 5 100 (ENDOMECHANICALS) ×3 IMPLANT
TROCAR XCEL 12X100 BLDLESS (ENDOMECHANICALS) ×4 IMPLANT
TROCAR XCEL BLUNT TIP 100MML (ENDOMECHANICALS) ×2 IMPLANT
TROCAR XCEL NON-BLD 11X100MML (ENDOMECHANICALS) IMPLANT
TUBING INSUF HEATED (TUBING) ×3 IMPLANT
YANKAUER SUCT BULB TIP 10FT TU (MISCELLANEOUS) ×3 IMPLANT

## 2018-06-18 NOTE — Anesthesia Procedure Notes (Signed)
Arterial Line Insertion Start/End10/30/2019 8:10 AM Performed by: [place] Shaquon Gropp C, CRNA, CRNA  Patient location: Pre-op. Preanesthetic checklist: patient identified, IV checked, site marked, risks and benefits discussed, surgical consent, monitors and equipment checked, pre-op evaluation, timeout performed and anesthesia consent Lidocaine 1% used for infiltration radial was placed Catheter size: 20 Fr Hand hygiene performed  and maximum sterile barriers used   Attempts: 1 Procedure performed without using ultrasound guided technique. Following insertion, dressing applied. Post procedure assessment: normal and unchanged

## 2018-06-18 NOTE — Anesthesia Postprocedure Evaluation (Signed)
Anesthesia Post Note  Patient: Michelle Wilcox  Procedure(s) Performed: [place] (N/A )     Patient location during evaluation: PACU Anesthesia Type: General Level of consciousness: awake and alert Pain management: pain level controlled Vital Signs Assessment: post-procedure vital signs reviewed and stable Respiratory status: spontaneous breathing, nonlabored ventilation, respiratory function stable and patient connected to nasal cannula oxygen Cardiovascular status: blood pressure returned to baseline and stable Postop Assessment: no apparent nausea or vomiting Anesthetic complications: no    Last Vitals:  Vitals:   06/18/18 1456 06/18/18 1603  BP: 129/74 127/74  Pulse: 69 71  Resp: 18 17  Temp: 36.6 C 36.6 C  SpO2:  97%    Last Pain:  Vitals:   06/18/18 1603  TempSrc: Oral  PainSc:                  Tayon Parekh P Dreydon Cardenas

## 2018-06-18 NOTE — Anesthesia Procedure Notes (Signed)
Procedure Name: Intubation Date/Time: 06/18/2018 8:42 AM Performed by: [place] Lucky Alverson C, CRNA Pre-anesthesia Checklist: Patient identified, Emergency Drugs available, Suction available and Patient being monitored Patient Re-evaluated:Patient Re-evaluated prior to induction Oxygen Delivery Method: Circle system utilized Preoxygenation: Pre-oxygenation with 100% oxygen Induction Type: IV induction Ventilation: Mask ventilation without difficulty Laryngoscope Size: Mac and 3 Grade View: Grade II Tube type: Oral Tube size: 7.0 mm Number of attempts: 1 Airway Equipment and Method: Stylet and Oral airway Placement Confirmation: ETT inserted through vocal cords under direct vision,  positive ETCO2 and breath sounds checked- equal and bilateral Secured at: 20 cm Tube secured with: Tape Dental Injury: Teeth and Oropharynx as per pre-operative assessment

## 2018-06-18 NOTE — Interval H&P Note (Signed)
History and Physical Interval Note:  06/18/2018 8:16 AM  Michelle Wilcox  has presented today for surgery, with the diagnosis of autoimmune anemia  The various methods of treatment have been discussed with the patient and family. After consideration of risks, benefits and other options for treatment, the patient has consented to  Procedure(s): [place] (N/A) as a surgical intervention .  The patient's history has been reviewed, patient examined, no change in status, stable for surgery.  I have reviewed the patient's chart and labs.  Questions were answered to the patient's satisfaction.     Darene Lamer Janyce Ellinger

## 2018-06-18 NOTE — Transfer of Care (Signed)
Immediate Anesthesia Transfer of Care Note  Patient: Michelle Wilcox  Procedure(s) Performed: [place] (N/A )  Patient Location: PACU  Anesthesia Type:General  Level of Consciousness: awake, alert  and oriented  Airway & Oxygen Therapy: Patient Spontanous Breathing and Patient connected to nasal cannula oxygen  Post-op Assessment: Report given to RN and Post -op Vital signs reviewed and stable  Post vital signs: Reviewed and stable  Last Vitals:  Vitals Value Taken Time  BP 143/66 06/18/2018 11:00 AM  Temp 36.8 C 06/18/2018 10:52 AM  Pulse 69 06/18/2018 11:00 AM  Resp 9 06/18/2018 11:00 AM  SpO2 98 % 06/18/2018 11:00 AM  Vitals shown include unvalidated device data.  Last Pain:  Vitals:   06/18/18 0648  TempSrc:   PainSc: 0-No pain         Complications: No apparent anesthesia complications

## 2018-06-18 NOTE — Op Note (Signed)
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 Initially responded to prednisone tapers but now has gone through rituximab and further prednisone tapers with decreasing response.  After extensive discussion preoperatively in consultation with her hematologist we have elected to proceed with laparoscopic splenectomy in an effort to improve her hemolytic anemia.  The procedure and recovery and risks have been discussed and documented elsewhere.    Procedure Detail: Patient was brought to the operating room, placed in the supine position on the operating table, and general endotracheal anesthesia induced.  Foley catheter was placed.  She was then positioned in a semi-right lateral decubitus position with the table flexed and kidney rest bumped up to open the left subcostal area.  The entire lower chest and abdomen were then widely sterilely prepped and draped.  PAS were in place.  She received preoperative IV antibiotics.  Patient timeout was performed to correct procedure verified.  Access was obtained with an open 12 mm Hassan trocar at the umbilicus through mattress suture of 0 Vicryl and pneumoperitoneum established.  Under direct vision I placed a 5 mm trocar subxiphoid and 212 mm trochars along the left subcostal margin.  Initially through these ports the splenic flexure of the colon was mobilized inferiorly and well away from the spleen.  An additional 5 mm port was placed laterally and a 5 mm port in the lower left upper quadrant for the camera.  Careful search was made for accessory spleens along the lesser omentum omentum and peritoneal surfaces and none found.  The spleen appeared mildly enlarged.   The stomach was initially mobilized dividing the lesser omentum adjacent to the stomach and entering the lesser sac.  The dissection then progressed proximally with the harmonic scalpel dividing short gastric vessels and completely freeing the fundus away from the spleen up to the hiatus.  Following this a few retroperitoneal attachments along the lower pole were divided with Harmonic Scalpel and this dissection joined with the splenic flexure mobilization.  The spleen was then mobilized inferiorly and laterally from the splenocolic and splenorenal ligaments working superiorly.  The spleen was rotated over medially and all posterior and lateral and finally superior ligamentous attachments were divided with harmonic scalpel until the spleen was completely freed down to the hilum.  The spleen was then elevated and the hilum divided directly adjacent to the spleen with 2 firings of the 60 mm white load stapler.  This appeared away from the tail of the spleen which we really did not exposed.  There was no bleeding from the staple line.  The spleen was placed in a large Endo Catch bag and brought up to 1 of the 12 mm subcostal sites.  This was enlarged for about a centimeter and a half and the spleen removed piecemeal.  This incision was closed with running 0 PDS.  Exparel blocks were performed bilaterally and additional infiltration of the incisions.  The operative site was irrigated and inspected and there was no evidence of injury or bleeding or other problems.  The hilum and Rall retroperitoneal surfaces were covered with Evaseal.  All CO2 was evacuated and the mattress suture was secured at the umbilicus.  Skin incisions were closed with subarticular Monocryl and Dermabond.  Sponge needle and instrument counts were correct.    Findings:  Mild splenomegaly  Estimated Blood Loss:  Minimal         Drains: None  Blood Given: none          Specimens: Spleen        Complications:  * No complications entered  in OR log *         Disposition: PACU - hemodynamically stable.         Condition: stable

## 2018-06-19 ENCOUNTER — Encounter (HOSPITAL_COMMUNITY): Payer: Self-pay | Admitting: General Surgery

## 2018-06-19 DIAGNOSIS — Z886 Allergy status to analgesic agent status: Secondary | ICD-10-CM | POA: Diagnosis not present

## 2018-06-19 DIAGNOSIS — D591 Other autoimmune hemolytic anemias: Secondary | ICD-10-CM | POA: Diagnosis not present

## 2018-06-19 DIAGNOSIS — I509 Heart failure, unspecified: Secondary | ICD-10-CM | POA: Diagnosis not present

## 2018-06-19 DIAGNOSIS — Z881 Allergy status to other antibiotic agents status: Secondary | ICD-10-CM | POA: Diagnosis not present

## 2018-06-19 DIAGNOSIS — D7389 Other diseases of spleen: Secondary | ICD-10-CM | POA: Diagnosis not present

## 2018-06-19 DIAGNOSIS — Z885 Allergy status to narcotic agent status: Secondary | ICD-10-CM | POA: Diagnosis not present

## 2018-06-19 LAB — BASIC METABOLIC PANEL
ANION GAP: 7 (ref 5–15)
BUN: 9 mg/dL (ref 8–23)
CALCIUM: 8.6 mg/dL — AB (ref 8.9–10.3)
CO2: 29 mmol/L (ref 22–32)
Chloride: 108 mmol/L (ref 98–111)
Creatinine, Ser: 0.61 mg/dL (ref 0.44–1.00)
GFR calc non Af Amer: >60 mL/min (ref >60)
Glucose, Bld: 100 mg/dL — ABNORMAL HIGH (ref 70–99)
POTASSIUM: 3.9 mmol/L (ref 3.5–5.1)
Sodium: 144 mmol/L (ref 135–145)

## 2018-06-19 LAB — CBC
HCT: 36.9 % (ref 36.0–46.0)
HEMOGLOBIN: 11.4 g/dL — AB (ref 12.0–15.0)
MCH: 29.8 pg (ref 26.0–34.0)
MCHC: 30.9 g/dL (ref 30.0–36.0)
MCV: 96.3 fL (ref 80.0–100.0)
NRBC: 0 % (ref 0.0–0.2)
PLATELETS: 323 10*3/uL (ref 150–400)
RBC: 3.83 MIL/uL — AB (ref 3.87–5.11)
RDW: 14 % (ref 11.5–15.5)
WBC: 13.7 10*3/uL — ABNORMAL HIGH (ref 4.0–10.5)

## 2018-06-19 NOTE — Progress Notes (Signed)
I have reviewed and concur with this student's documentation.   

## 2018-06-19 NOTE — Discharge Instructions (Signed)
[facility] ______[facility] LAPAROSCOPIC SURGERY: POST OP INSTRUCTIONS Always review your discharge instruction sheet given to you by the facility where your surgery was performed. IF YOU HAVE DISABILITY OR FAMILY LEAVE FORMS, YOU MUST BRING THEM TO THE OFFICE FOR PROCESSING.   DO NOT GIVE THEM TO YOUR DOCTOR.  1. A prescription for pain medication may be given to you upon discharge.  Take your pain medication as prescribed, if needed.  If narcotic pain medicine is not needed, then you may take acetaminophen (Tylenol) or ibuprofen (Advil) as needed. 2. Take your usually prescribed medications unless otherwise directed. 3. If you need a refill on your pain medication, please contact your pharmacy.  They will contact our office to request authorization. Prescriptions will not be filled after 5pm or on week-ends. 4. You should follow a light diet the first few days after arrival home, such as soup and crackers, etc.  Be sure to include lots of fluids daily. 5. Most patients will experience some swelling and bruising in the area of the incisions.  Ice packs will help.  Swelling and bruising can take several days to resolve.  6. It is common to experience some constipation if taking pain medication after surgery.  Increasing fluid intake and taking a stool softener (such as Colace) will usually help or prevent this problem from occurring.  A mild laxative (Milk of Magnesia or Miralax) should be taken according to package instructions if there are no bowel movements after 48 hours. 7. Unless discharge instructions indicate otherwise, you may remove your bandages 24-48 hours after surgery, and you may shower at that time.  You may have steri-strips (small skin tapes) in place directly over the incision.  These strips should be left on the skin for 7-10 days.  If your surgeon used skin glue on the incision, you may shower in 24 hours.  The glue will flake off over the next 2-3 weeks.  Any sutures or  staples will be removed at the office during your follow-up visit. 8. ACTIVITIES:  You may resume regular (light) daily activities beginning the next day--such as daily self-care, walking, climbing stairs--gradually increasing activities as tolerated.  You may have sexual intercourse when it is comfortable.  Refrain from any heavy lifting or straining until approved by your doctor. a. You may drive when you are no longer taking prescription pain medication, you can comfortably wear a seatbelt, and you can safely maneuver your car and apply brakes. b. RETURN TO WORK:  ______ May return to work July 02, 2018 without restrictions ____________________________________________________ 9. You should see your doctor in the office for a follow-up appointment approximately 2-3 weeks after your surgery.  Make sure that you call for this appointment within a day or two after you arrive home to insure a convenient appointment time. 10. OTHER INSTRUCTIONS: __________________________________________________________________________________________________________________________ __________________________________________________________________________________________________________________________ WHEN TO CALL YOUR DOCTOR: 1. Fever over 101.0 2. Inability to urinate 3. Continued bleeding from incision. 4. Increased pain, redness, or drainage from the incision. 5. Increasing abdominal pain  The clinic staff is available to answer your questions during regular business hours.  Please dont hesitate to call and ask to speak to one of the nurses for clinical concerns.  If you have a medical emergency, go to the nearest emergency room or call 911.  A surgeon from [facility] is always on call at the hospital. [address], [place], [place], [place]  [address] ? [address], [place], [place]   [address] [phone]  [phone] ? [phone] ? FAX [phone] Web site: [email or web address]

## 2018-06-19 NOTE — Discharge Summary (Signed)
Patient ID: Michelle Wilcox [id number] 72 y.o. 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 Initially responded to prednisone tapers but now has gone through rituximab and further prednisone tapers with decreasing response.  After extensive discussion preoperatively in consultation with her hematologist we have elected to proceed with laparoscopic splenectomy in an effort to improve her hemolytic anemia.  Hospital Course: On the morning of admission the patient underwent an uneventful laparoscopic splenectomy.  The following morning her vital signs are stable.  Hemoglobin is 11.4.  She is comfortable with just soreness.  No nausea.  Abdomen is soft and nontender and incisions clean and dry.  She is felt ready for discharge.   Disposition: Home  Patient Instructions:  Allergies as of 06/19/2018      Reactions   Penicillins Anaphylaxis   Has patient had a PCN reaction causing immediate rash, facial/tongue/throat swelling, SOB or lightheadedness with hypotension: yes Has patient had a PCN reaction causing severe rash involving mucus membranes or skin necrosis: no Has patient had a PCN reaction that required hospitalization: yes Has patient had a PCN reaction occurring within the last 10 years: no If all of the above answers are "NO", then may proceed with Cephalosporin use.   Almond (diagnostic) Hives   Ceftin [cefuroxime Axetil] Hives   headaches   Ciprofloxacin Hives   headaches   Darvocet [propoxyphene N-acetaminophen]    headaches   Lactose Intolerance (gi) Diarrhea   Levaquin  [levofloxacin In D5w] Hives   Sulfa Antibiotics Hives   Zyrtec [cetirizine Hcl]    headache   Other    Powder in gloves - Rash MSG - Hives Swiss Cheese - Hives      Medication List    TAKE these medications   acetaminophen 650 MG CR tablet Commonly known as:  TYLENOL Take 1,300 mg by mouth 2 (two) times daily.   ALIGN 4 MG Caps Take 4 mg by mouth every morning.   folic acid 1 MG tablet Commonly known as:  FOLVITE Take 1 tablet (1 mg total) by mouth daily.   furosemide 40 MG tablet Commonly known as:  LASIX Take 20-40 mg by mouth daily.   GAS-X 80 MG chewable tablet Generic drug:  simethicone Chew 80 mg by mouth every 6 (six) hours as needed for flatulence.   lactase 3000 units tablet Commonly known as:  LACTAID Take 6,000-9,000 Units by mouth as needed (consuming dairy products).   loperamide 2 MG tablet Commonly known as:  IMODIUM A-D Take 2 mg by mouth as needed for diarrhea or loose stools (takes 2 if needed).   loratadine 10 MG tablet Commonly known as:  CLARITIN Take 10 mg by mouth daily.   metoprolol tartrate 50 MG tablet Commonly known as:  LOPRESSOR Take 25-50 mg by mouth See admin instructions. Take 1 tablet (50 mg total) by mouth every morning AND take 0.5 tablet (25 mg total) by mouth every evening.   multivitamin tablet Take 1 tablet by mouth every evening.   omeprazole 20 MG tablet Commonly known as:  PRILOSEC OTC Take 20 mg by mouth every morning.   potassium chloride SA 20 MEQ tablet Commonly known as:  K-DUR,KLOR-CON Take 10-20 mEq by  mouth daily.   predniSONE 2.5 MG tablet Commonly known as:  DELTASONE Take 1 tablet (2.5 mg total) by mouth daily with breakfast. What changed:    when to take this  additional instructions   predniSONE 10 MG tablet Commonly known as:  DELTASONE Take 1 tablet (10 mg total) by mouth daily with breakfast. What changed:  Another medication with the same name was changed. Make sure you understand how and  when to take each.   VENTOLIN HFA 108 (90 Base) MCG/ACT inhaler Generic drug:  albuterol Take 1-2 puffs by mouth every 4 (four) hours as needed for shortness of breath.   Vitamin D 2000 units tablet Take 2,000 Units by mouth daily.       Activity: activity as tolerated Diet: regular diet Wound Care: none needed  Follow-up:  With Dr. Excell Seltzer in 3 weeks.  Signed: Edward Jolly MD, FACS  06/19/2018, 8:51 AM

## 2018-06-19 NOTE — Progress Notes (Signed)
Pt was discharged home today. Instructions were reviewed with patient, and questions were answered. Pt was taken to main entrance via wheelchair by NT.  

## 2018-07-07 ENCOUNTER — Telehealth: Payer: Self-pay

## 2018-07-07 ENCOUNTER — Other Ambulatory Visit: Payer: Self-pay | Admitting: *Deleted

## 2018-07-07 ENCOUNTER — Inpatient Hospital Stay: Payer: Medicare Other | Attending: Nurse Practitioner | Admitting: Oncology

## 2018-07-07 ENCOUNTER — Inpatient Hospital Stay: Payer: Medicare Other

## 2018-07-07 VITALS — BP 136/62 | HR 69 | Temp 98.4°F | Resp 18 | Ht 64.0 in | Wt 181.1 lb

## 2018-07-07 DIAGNOSIS — D591 Autoimmune hemolytic anemia, unspecified: Secondary | ICD-10-CM

## 2018-07-07 DIAGNOSIS — Z9081 Acquired absence of spleen: Secondary | ICD-10-CM | POA: Diagnosis not present

## 2018-07-07 LAB — CBC WITH DIFFERENTIAL (CANCER CENTER ONLY)
Abs Immature Granulocytes: 0.07 10*3/uL (ref 0.00–0.07)
BASOS PCT: 1 %
Basophils Absolute: 0.2 10*3/uL — ABNORMAL HIGH (ref 0.0–0.1)
EOS ABS: 0.8 10*3/uL — AB (ref 0.0–0.5)
Eosinophils Relative: 5 %
HEMATOCRIT: 41.5 % (ref 36.0–46.0)
Hemoglobin: 12.6 g/dL (ref 12.0–15.0)
Immature Granulocytes: 0 %
LYMPHS ABS: 1.5 10*3/uL (ref 0.7–4.0)
Lymphocytes Relative: 9 %
MCH: 29.6 pg (ref 26.0–34.0)
MCHC: 30.4 g/dL (ref 30.0–36.0)
MCV: 97.6 fL (ref 80.0–100.0)
MONO ABS: 0.8 10*3/uL (ref 0.1–1.0)
MONOS PCT: 4 %
Neutro Abs: 14.3 10*3/uL — ABNORMAL HIGH (ref 1.7–7.7)
Neutrophils Relative %: 81 %
PLATELETS: 459 10*3/uL — AB (ref 150–400)
RBC: 4.25 MIL/uL (ref 3.87–5.11)
RDW: 14 % (ref 11.5–15.5)
WBC Count: 17.6 10*3/uL — ABNORMAL HIGH (ref 4.0–10.5)
nRBC: 0 % (ref 0.0–0.2)

## 2018-07-07 LAB — RETICULOCYTES
Immature Retic Fract: 11.5 % (ref 2.3–15.9)
RBC.: 4.25 MIL/uL (ref 3.87–5.11)
RETIC COUNT ABSOLUTE: 69.7 10*3/uL (ref 19.0–186.0)
Retic Ct Pct: 1.6 % (ref 0.4–3.1)

## 2018-07-07 NOTE — Progress Notes (Signed)
[name] OFFICE PROGRESS NOTE   Diagnosis: Autoimmune hemolytic anemia  INTERVAL HISTORY:   Michelle Wilcox when a laparoscopic splenectomy on 06/18/2018.  She reports tolerating procedure well.  She continues to have pain at the right upper arm with extension.  No other complaint.  The pathology from the spleen returned benign.  Zone at a dose of 10 mg alternating with 12.5 mg daily Objective:  Vital signs in last 24 hours:  Blood pressure 136/62, pulse 69, temperature 98.4 F (36.9 C), temperature source Oral, resp. rate 18, height 5\' 4"  (1.626 m), weight 181 lb 1.6 oz (82.1 kg), SpO2 97 %.    HEENT: White coat over the tongue, no buccal thrush Resp: Lungs clear bilaterally Cardio: Regular rate and rhythm GI: No hepatomegaly, healed surgical incisions Vascular: No leg edema Musculoskeletal: No pain with motion of the right shoulder.  Slight irregularity of the subcutaneous tissue at the right deltoid.  No discrete mass.    Lab Results:  Lab Results  Component Value Date   WBC 17.6 (H) 07/07/2018   HGB 12.6 07/07/2018   HCT 41.5 07/07/2018   MCV 97.6 07/07/2018   PLT 459 (H) 07/07/2018   NEUTROABS 14.3 (H) 07/07/2018     Medications: I have reviewed the patient's current medications.   Assessment/Plan: 1. 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, DAT IgG positive  Prednisone started 03/20/2016  Tapered to 15 mg daily on 04/30/2016  Hemoglobin lower, elevated LDH 05/21/2016-prednisone increased to 30 mgdaily  Prednisone taper to 25 mg daily 06/18/2016  Prednisone taper to 20 mg daily 07/16/2016  Prednisone taper to 15 mg daily 08/27/2016  Prednisone taper to 12.5 mg daily 09/25/2016  Hemoglobin lower, prednisone increased to 20 mgdaily on 10/11/2016  Cycle 1 rituximab 11/19/2016  Cycle 2 rituximab 11/26/2016  Cycle 3 rituximab 12/03/2016  Cycle 4 rituximab 12/28/2016  Prednisone taper to 15 mg  daily 12/28/2016  Prednisone taper to 12.5 mg daily 01/28/2017  Prednisone taper to 10 mg daily 02/19/2017  Prednisone taper to 7.5 mg daily 04/01/2017  Prednisone taper to 5 mg daily 05/20/2017  Admission with progressive anemia and evidence of hemolysis 08/03/2017  Prednisone resumed at a dose of 60 mg daily  Prednisone taper to 40 mg 08/12/2017  Prednisone taper to 30 mg daily 08/24/2017  Prednisone taper to 20 mg daily 09/03/2017  Prednisone taper to 15 mg daily 09/20/2017  Prednisone taper to 12.5 mg daily 10/14/2017  Pednisone taper to 10 mg daily 11/05/2017  Progressiveanemia 12/16/2017  Prednisone increased to 20 mg daily 12/16/2017  Prednisone decreased to 15 mg daily 01/06/2018  Prednisone decreased to 12.5 mg daily 02/17/2018  Prednisone taper to 12.5 mg alternating with 10 mg daily 03/17/2018  Splenectomy 06/18/2018  Prednisone decreased to 10 mg daily 07/07/2018  2. History ofExertional dyspnea secondary to #1  3. Mild splenomegaly on CT 03/16/2016  4. History of polymyalgia rheumatica  5. History of giant cell/temporal arteritis-maintained on methotrexate in the remote past, now on low dose prednisone  6.Admission with leg edema 08/03/2017-diastolic heart failure?, Exacerbated by anemia  7.Right arm superficial phlebitis 03/17/2018, resolved    Disposition: Michelle Wilcox has recovered from the splenectomy procedure.  The hemoglobin is stable and the reticulocyte count is not elevated today.  She will taper the prednisone to 10 mg daily.  She will return for a CBC in 3 weeks and an office visit in 6 weeks.  We will make an orthopedic referral if the right shoulder  discomfort persists.  15 minutes were spent with the patient today.  The majority of the time was used for counseling and coordination of care.  Betsy Coder, MD  07/07/2018  9:51 AM

## 2018-07-07 NOTE — Telephone Encounter (Signed)
Printed avs and calender of upcoming appointment. Per 11/18 los 

## 2018-07-28 ENCOUNTER — Inpatient Hospital Stay: Payer: Medicare Other | Attending: Nurse Practitioner

## 2018-07-28 ENCOUNTER — Telehealth: Payer: Self-pay | Admitting: *Deleted

## 2018-07-28 DIAGNOSIS — Z7952 Long term (current) use of systemic steroids: Secondary | ICD-10-CM | POA: Insufficient documentation

## 2018-07-28 DIAGNOSIS — D591 Autoimmune hemolytic anemia, unspecified: Secondary | ICD-10-CM

## 2018-07-28 DIAGNOSIS — Z9081 Acquired absence of spleen: Secondary | ICD-10-CM | POA: Diagnosis not present

## 2018-07-28 LAB — CBC WITH DIFFERENTIAL (CANCER CENTER ONLY)
ABS IMMATURE GRANULOCYTES: 0.04 10*3/uL (ref 0.00–0.07)
BASOS PCT: 1 %
Basophils Absolute: 0.1 10*3/uL (ref 0.0–0.1)
Eosinophils Absolute: 0.3 10*3/uL (ref 0.0–0.5)
Eosinophils Relative: 2 %
HEMATOCRIT: 39.9 % (ref 36.0–46.0)
HEMOGLOBIN: 12.5 g/dL (ref 12.0–15.0)
IMMATURE GRANULOCYTES: 0 %
LYMPHS ABS: 1 10*3/uL (ref 0.7–4.0)
LYMPHS PCT: 7 %
MCH: 30.3 pg (ref 26.0–34.0)
MCHC: 31.3 g/dL (ref 30.0–36.0)
MCV: 96.6 fL (ref 80.0–100.0)
MONO ABS: 0.6 10*3/uL (ref 0.1–1.0)
MONOS PCT: 4 %
NEUTROS ABS: 12.4 10*3/uL — AB (ref 1.7–7.7)
NEUTROS PCT: 86 %
PLATELETS: 383 10*3/uL (ref 150–400)
RBC: 4.13 MIL/uL (ref 3.87–5.11)
RDW: 15 % (ref 11.5–15.5)
WBC Count: 14.4 10*3/uL — ABNORMAL HIGH (ref 4.0–10.5)
nRBC: 0 % (ref 0.0–0.2)

## 2018-07-28 LAB — RETICULOCYTES
IMMATURE RETIC FRACT: 10.7 % (ref 2.3–15.9)
RBC.: 4.13 MIL/uL (ref 3.87–5.11)
RETIC COUNT ABSOLUTE: 75.6 10*3/uL (ref 19.0–186.0)
Retic Ct Pct: 1.8 % (ref 0.4–3.1)

## 2018-07-28 NOTE — Telephone Encounter (Signed)
Attempted to reach patient without success.

## 2018-07-28 NOTE — Telephone Encounter (Signed)
-----   Message from Ladell Pier, MD sent at 07/28/2018  3:29 PM EST ----- Please call patient, the hemoglobin is stable, decrease the prednisone to 7.5 mg daily, follow-up as scheduled

## 2018-07-30 MED ORDER — PREDNISONE 5 MG PO TABS: 7.5000 mg | ORAL_TABLET | Freq: Every day | ORAL | 0 refills | Status: DC

## 2018-07-30 NOTE — Telephone Encounter (Signed)
Notified husband of CBC results and that will taper her Prednisone to 7.5 mg daily. Will call in new script for 5 mg, so she will take 1 1/2 tabs daily. Follow up as scheduled on 08/18/18. Informed him that the patient's mailbox was full, so I was not able to reach her personally.

## 2018-07-30 NOTE — Addendum Note (Signed)
Addended by: Tania Ade on: 07/30/2018 01:48 PM   Modules accepted: Orders

## 2018-08-05 IMAGING — CR DG CHEST 2V
2 series · 2 of 2 positions shown · non-contrast
Comparison: 02/15/2016.

CLINICAL DATA: Cough congestion and dyspnea for 6 days, worsened
this morning.

EXAM:
CHEST  2 VIEW

[w chest pa]
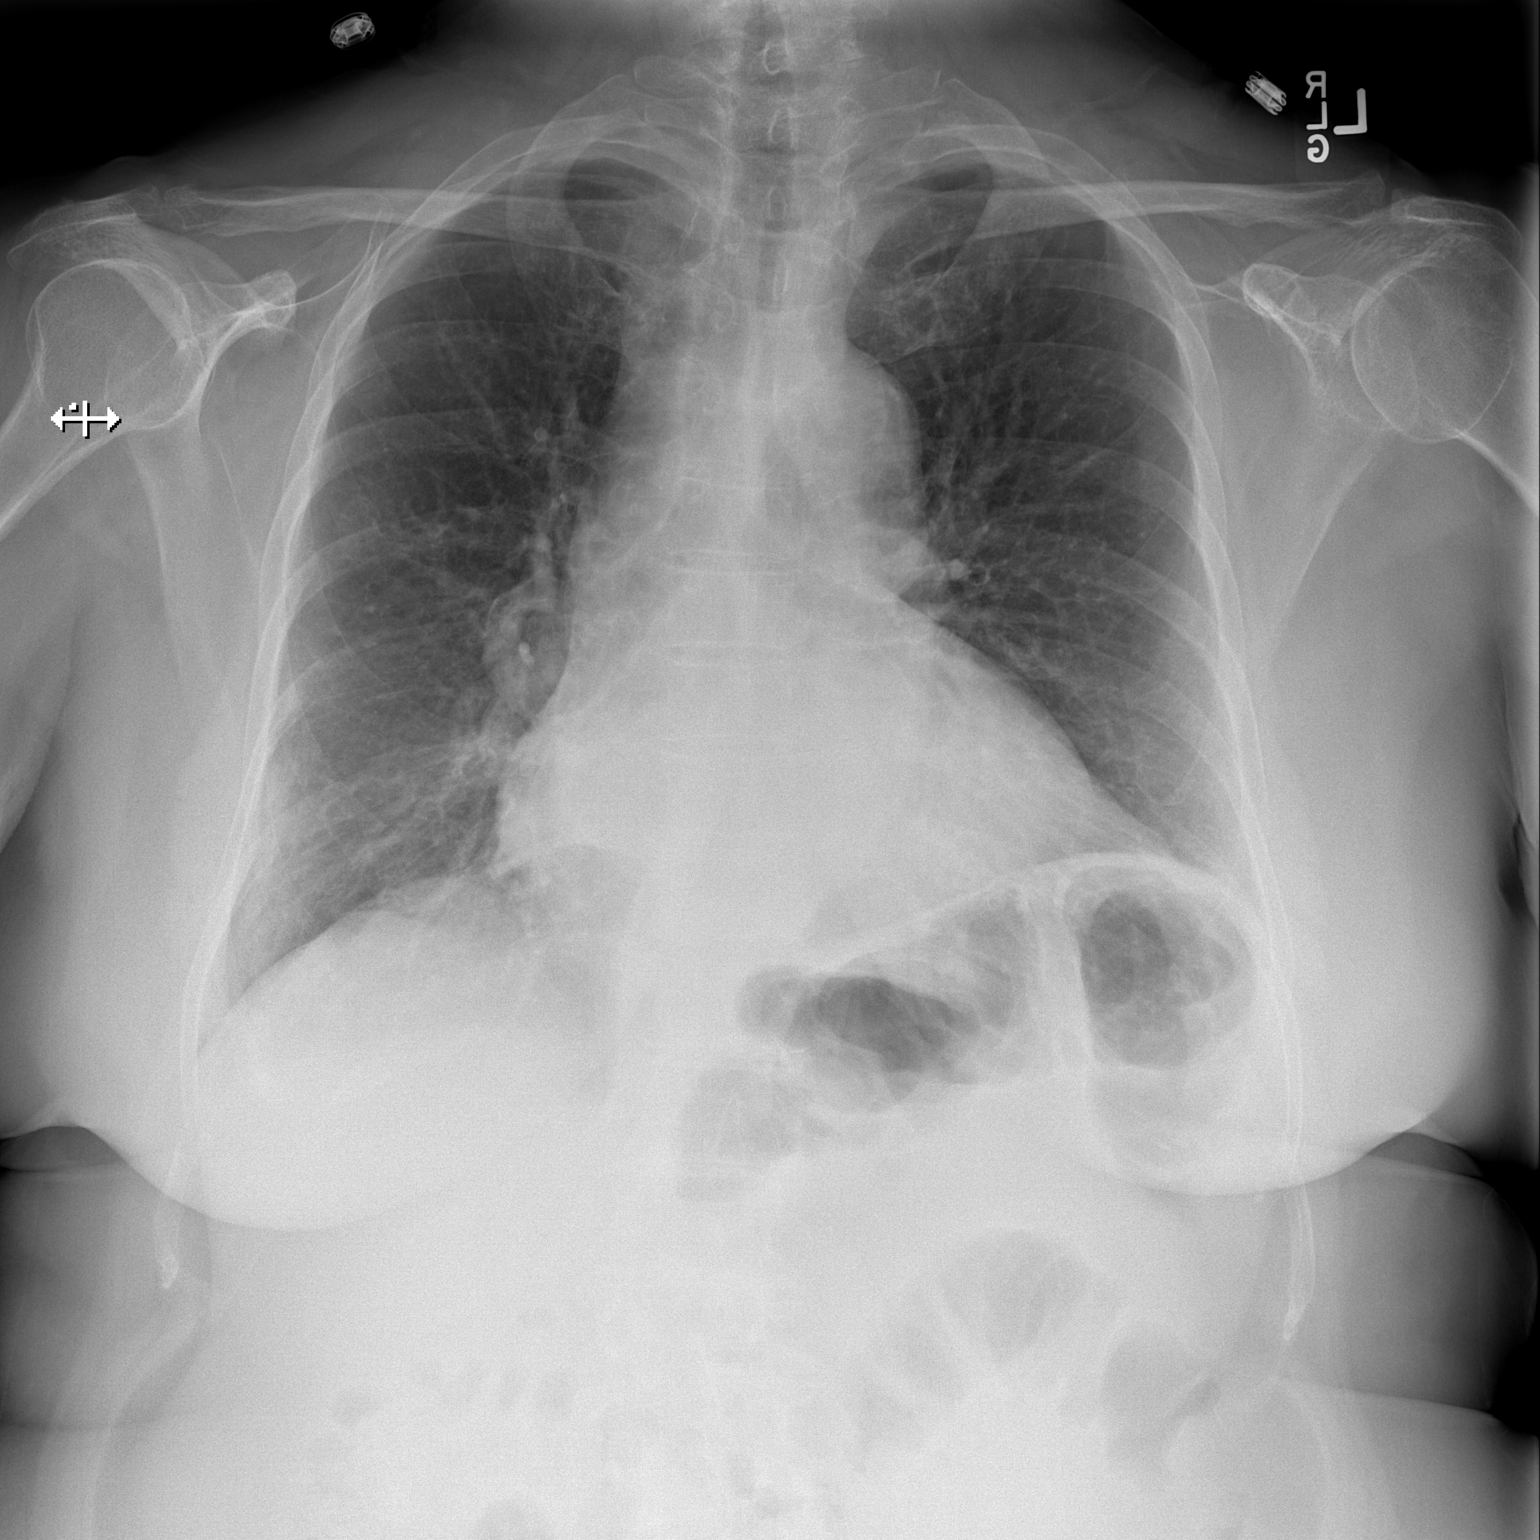

[w chest lat]
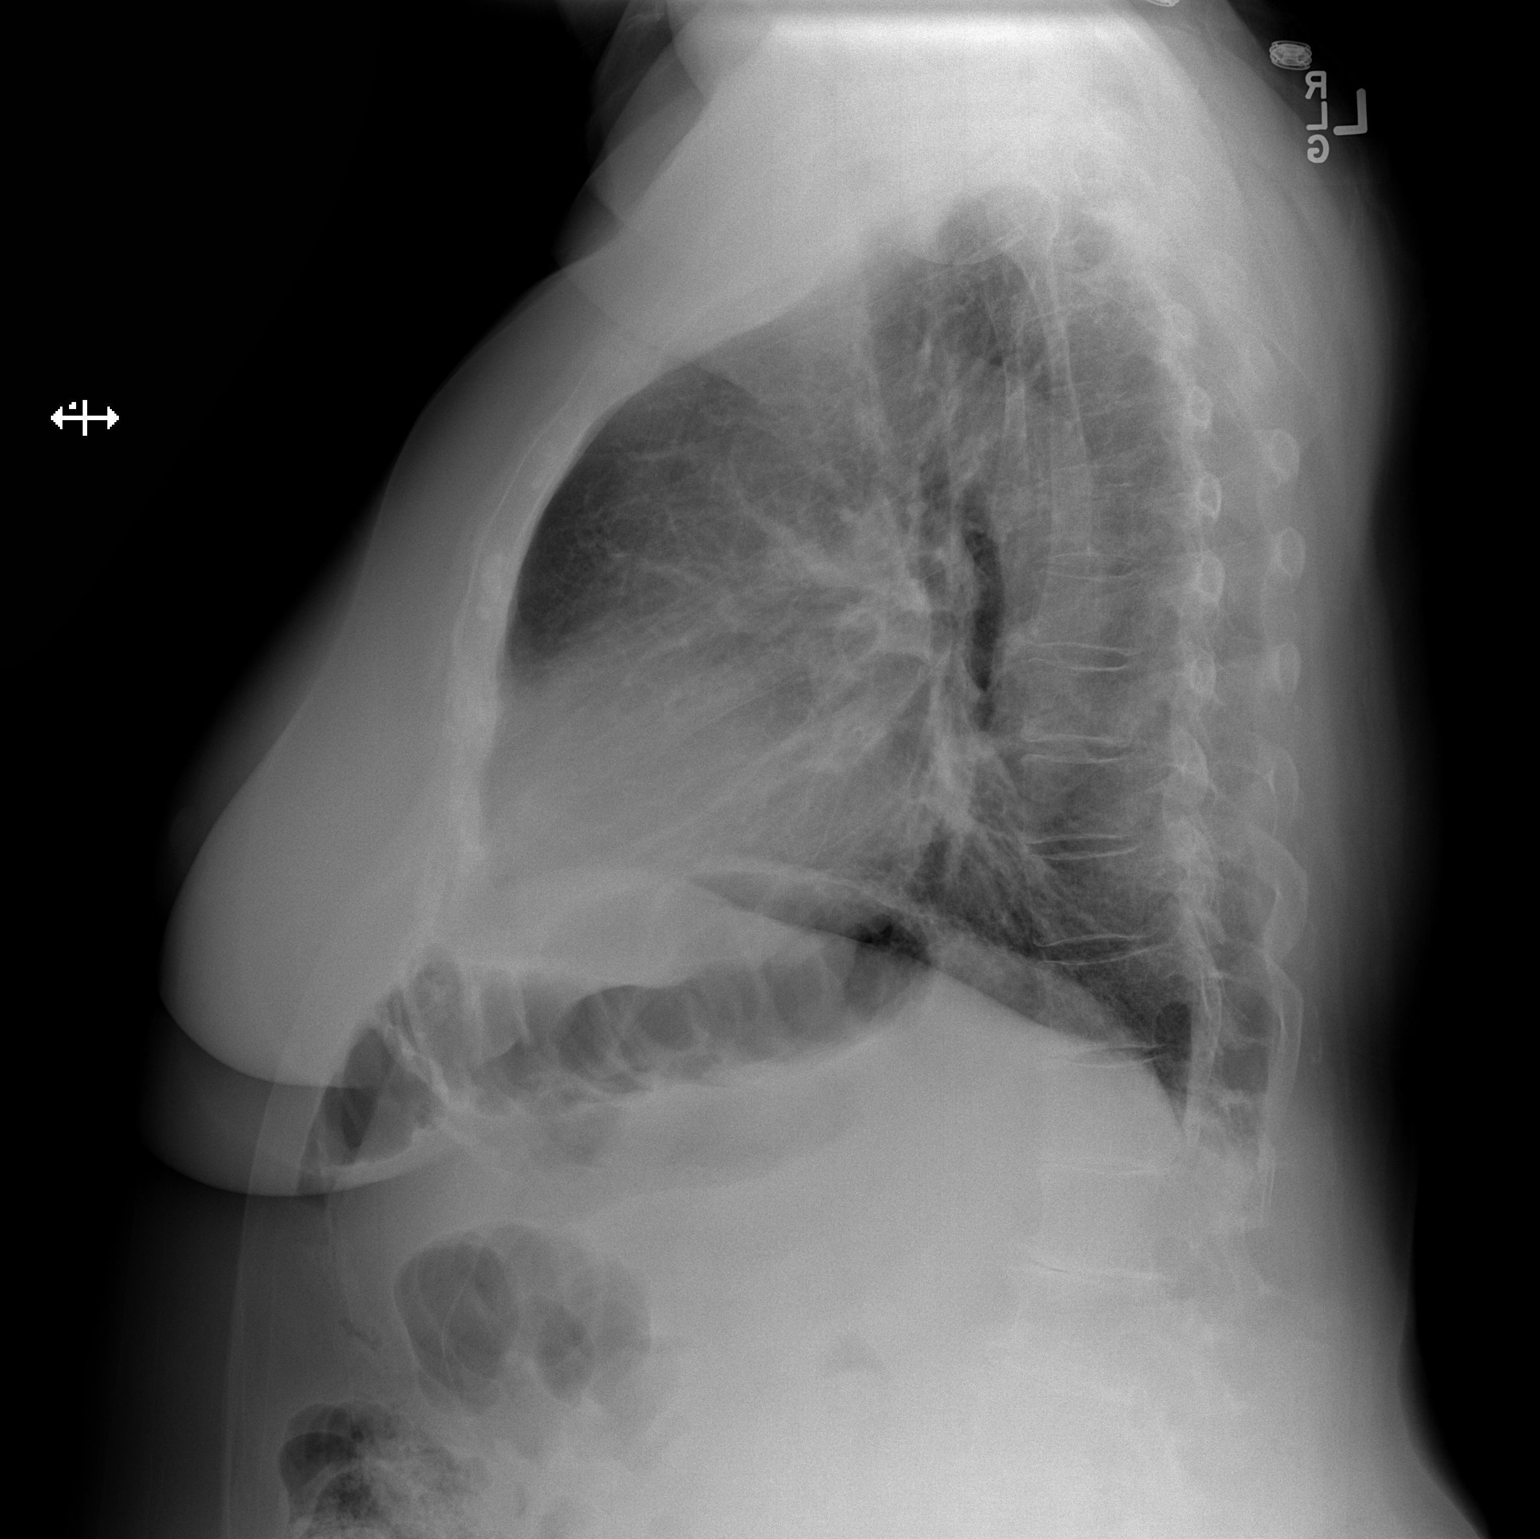

[2 of 2 positions shown; findings below may reference images not displayed]

FINDINGS: The lungs are clear except for minimal linear scarring in the right
lateral base. There is borderline cardiomegaly, unchanged. Hilar and
mediastinal contours are unremarkable and unchanged. Pulmonary
vasculature is normal. No pleural effusions.
IMPRESSION: Stable borderline cardiomegaly. No consolidation or effusion. Normal
vasculature.

## 2018-08-18 ENCOUNTER — Other Ambulatory Visit: Payer: Self-pay | Admitting: Family Medicine

## 2018-08-18 ENCOUNTER — Inpatient Hospital Stay: Payer: Medicare Other

## 2018-08-18 ENCOUNTER — Inpatient Hospital Stay (HOSPITAL_BASED_OUTPATIENT_CLINIC_OR_DEPARTMENT_OTHER): Payer: Medicare Other | Admitting: Oncology

## 2018-08-18 ENCOUNTER — Telehealth: Payer: Self-pay | Admitting: Oncology

## 2018-08-18 VITALS — BP 132/58 | HR 71 | Temp 98.1°F | Resp 16 | Ht 64.0 in | Wt 182.9 lb

## 2018-08-18 DIAGNOSIS — D591 Autoimmune hemolytic anemia, unspecified: Secondary | ICD-10-CM

## 2018-08-18 DIAGNOSIS — Z9081 Acquired absence of spleen: Secondary | ICD-10-CM | POA: Diagnosis not present

## 2018-08-18 DIAGNOSIS — M85852 Other specified disorders of bone density and structure, left thigh: Secondary | ICD-10-CM | POA: Diagnosis not present

## 2018-08-18 DIAGNOSIS — I5032 Chronic diastolic (congestive) heart failure: Secondary | ICD-10-CM | POA: Diagnosis not present

## 2018-08-18 DIAGNOSIS — J301 Allergic rhinitis due to pollen: Secondary | ICD-10-CM | POA: Diagnosis not present

## 2018-08-18 DIAGNOSIS — Z7952 Long term (current) use of systemic steroids: Secondary | ICD-10-CM | POA: Diagnosis not present

## 2018-08-18 DIAGNOSIS — Z6830 Body mass index (BMI) 30.0-30.9, adult: Secondary | ICD-10-CM | POA: Diagnosis not present

## 2018-08-18 DIAGNOSIS — F419 Anxiety disorder, unspecified: Secondary | ICD-10-CM | POA: Diagnosis not present

## 2018-08-18 DIAGNOSIS — Z1231 Encounter for screening mammogram for malignant neoplasm of breast: Secondary | ICD-10-CM

## 2018-08-18 DIAGNOSIS — M353 Polymyalgia rheumatica: Secondary | ICD-10-CM | POA: Diagnosis not present

## 2018-08-18 DIAGNOSIS — I10 Essential (primary) hypertension: Secondary | ICD-10-CM | POA: Diagnosis not present

## 2018-08-18 DIAGNOSIS — K52831 Collagenous colitis: Secondary | ICD-10-CM | POA: Diagnosis not present

## 2018-08-18 DIAGNOSIS — E785 Hyperlipidemia, unspecified: Secondary | ICD-10-CM | POA: Diagnosis not present

## 2018-08-18 LAB — CBC WITH DIFFERENTIAL (CANCER CENTER ONLY)
Abs Immature Granulocytes: 0.03 10*3/uL (ref 0.00–0.07)
BASOS PCT: 1 %
Basophils Absolute: 0.1 10*3/uL (ref 0.0–0.1)
EOS ABS: 0.1 10*3/uL (ref 0.0–0.5)
Eosinophils Relative: 1 %
HCT: 39.3 % (ref 36.0–46.0)
Hemoglobin: 12.3 g/dL (ref 12.0–15.0)
IMMATURE GRANULOCYTES: 0 %
Lymphocytes Relative: 12 %
Lymphs Abs: 1.2 10*3/uL (ref 0.7–4.0)
MCH: 29.9 pg (ref 26.0–34.0)
MCHC: 31.3 g/dL (ref 30.0–36.0)
MCV: 95.4 fL (ref 80.0–100.0)
Monocytes Absolute: 0.5 10*3/uL (ref 0.1–1.0)
Monocytes Relative: 5 %
NEUTROS PCT: 81 %
NRBC: 0 % (ref 0.0–0.2)
Neutro Abs: 8.6 10*3/uL — ABNORMAL HIGH (ref 1.7–7.7)
Platelet Count: 412 10*3/uL — ABNORMAL HIGH (ref 150–400)
RBC: 4.12 MIL/uL (ref 3.87–5.11)
RDW: 14.9 % (ref 11.5–15.5)
WBC Count: 10.6 10*3/uL — ABNORMAL HIGH (ref 4.0–10.5)

## 2018-08-18 NOTE — Progress Notes (Signed)
[place] OFFICE PROGRESS NOTE   Diagnosis: Hemolytic anemia  INTERVAL HISTORY:   Michelle Wilcox returns as scheduled.  She feels well.  The right arm discomfort has resolved.  She has been maintained on prednisone at a dose of 7.5 mg since 07/30/2018.  Objective:  Vital signs in last 24 hours:  Blood pressure (!) 148/68, pulse 71, temperature 98.1 F (36.7 C), temperature source Oral, resp. rate 16, height 5\' 4"  (1.626 m), weight 182 lb 14.4 oz (83 kg), SpO2 96 %.    HEENT: No thrush Resp: Mild bilateral expiratory wheeze at the posterior chest, no respiratory distress Cardio: Regular rate and rhythm GI: No hepatomegaly, no mass, nontender, healed surgical incisions Vascular: No leg edema   Lab Results:  Lab Results  Component Value Date   WBC 10.6 (H) 08/18/2018   HGB 12.3 08/18/2018   HCT 39.3 08/18/2018   MCV 95.4 08/18/2018   PLT 412 (H) 08/18/2018   NEUTROABS 8.6 (H) 08/18/2018   Reticulocyte count on 07/28/2018- 75.6  Medications: I have reviewed the patient's current medications.   Assessment/Plan:  1. 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5 mg daily 01/28/2017  Prednisone taper to 10 mg daily 02/19/2017  Prednisone taper to 7.5 mg daily 04/01/2017  Prednisone taper to 5 mg daily  05/20/2017  Admission with progressive anemia and evidence of hemolysis 08/03/2017  Prednisone resumed at a dose of 60 mg daily  Prednisone taper to 40 mg 08/12/2017  Prednisone taper to 30 mg daily 08/24/2017  Prednisone taper to 20 mg daily 09/03/2017  Prednisone taper to 15 mg daily 09/20/2017  Prednisone taper to 12.5 mg daily 10/14/2017  Pednisone taper to 10 mg daily 11/05/2017  Progressiveanemia 12/16/2017  Prednisone increased to 20 mg daily 12/16/2017  Prednisone decreased to 15 mg daily 01/06/2018  Prednisone decreased to 12.5 mg daily 02/17/2018  Prednisone taper to 12.5 mg alternating with 10 mg daily 03/17/2018  Splenectomy 06/18/2018  Prednisone decreased to 10 mg daily 07/07/2018  Prednisone decreased to 7.5 mg daily 07/30/2018  Prednisone changed to 7.5 alternating with 5 mg daily 08/18/2018  2. History ofExertional dyspnea secondary to #1  3. Mild splenomegaly on CT 03/16/2016  4. History of polymyalgia rheumatica  5. History of giant cell/temporal arteritis-maintained on methotrexate in the remote past, now on low dose prednisone  6.Admission with leg edema 08/03/2017-diastolic heart failure?, Exacerbated by anemia  7.Right arm superficial phlebitis 03/17/2018, resolved    Disposition: Michelle Wilcox appears well.  The hemoglobin remains in the normal range.  The prednisone will be changed to 7.5 mg alternating with 5 mg daily.  She will return for a CBC in 3 weeks.  The plan is to taper the prednisone further if the hemoglobin remains normal when she is here in 3 weeks.  She will return for an office visit in 6 weeks.  15 minutes were spent with the  patient today.  The majority of the time was used for counseling and coordination of care.  Betsy Coder, MD  08/18/2018  3:27 PM

## 2018-08-18 NOTE — Telephone Encounter (Signed)
Printed calendar and avs. °

## 2018-08-19 ENCOUNTER — Encounter: Payer: Self-pay | Admitting: *Deleted

## 2018-08-19 NOTE — Progress Notes (Signed)
Per MD after OV on 08/18/18: Prednisone has been changed to 5 mg alternate with 7.5 mg.

## 2018-08-21 ENCOUNTER — Other Ambulatory Visit: Payer: Self-pay | Admitting: Nurse Practitioner

## 2018-08-21 DIAGNOSIS — D591 Autoimmune hemolytic anemia, unspecified: Secondary | ICD-10-CM

## 2018-08-22 ENCOUNTER — Other Ambulatory Visit: Payer: Self-pay

## 2018-08-22 DIAGNOSIS — D591 Autoimmune hemolytic anemia, unspecified: Secondary | ICD-10-CM

## 2018-08-22 MED ORDER — FOLIC ACID 1 MG PO TABS: 1.0000 mg | ORAL_TABLET | Freq: Every day | ORAL | 0 refills | Status: DC

## 2018-08-22 NOTE — Telephone Encounter (Signed)
Left message for patient that we sent in refill on Folic Acid to [facility]

## 2018-09-02 ENCOUNTER — Telehealth: Payer: Self-pay | Admitting: *Deleted

## 2018-09-02 NOTE — Telephone Encounter (Signed)
Call from dental practice asking if patient requires premed antibiotics before dental procedures? Per Dr. Benay Spice, she does not. Called back and made aware. Requested this be noted in writing and faxed to office [phone]. Faxed as requested.

## 2018-09-08 ENCOUNTER — Inpatient Hospital Stay: Payer: Medicare Other | Attending: Nurse Practitioner

## 2018-09-08 ENCOUNTER — Other Ambulatory Visit: Payer: Self-pay | Admitting: *Deleted

## 2018-09-08 ENCOUNTER — Telehealth: Payer: Self-pay | Admitting: *Deleted

## 2018-09-08 DIAGNOSIS — D591 Autoimmune hemolytic anemia, unspecified: Secondary | ICD-10-CM

## 2018-09-08 DIAGNOSIS — Z9081 Acquired absence of spleen: Secondary | ICD-10-CM | POA: Diagnosis not present

## 2018-09-08 DIAGNOSIS — Z7952 Long term (current) use of systemic steroids: Secondary | ICD-10-CM | POA: Insufficient documentation

## 2018-09-08 LAB — CBC WITH DIFFERENTIAL (CANCER CENTER ONLY)
Abs Immature Granulocytes: 0.02 10*3/uL (ref 0.00–0.07)
Basophils Absolute: 0.2 10*3/uL — ABNORMAL HIGH (ref 0.0–0.1)
Basophils Relative: 3 %
EOS PCT: 2 %
Eosinophils Absolute: 0.1 10*3/uL (ref 0.0–0.5)
HCT: 40.5 % (ref 36.0–46.0)
Hemoglobin: 12.9 g/dL (ref 12.0–15.0)
Immature Granulocytes: 0 %
Lymphocytes Relative: 14 %
Lymphs Abs: 0.8 10*3/uL (ref 0.7–4.0)
MCH: 30.4 pg (ref 26.0–34.0)
MCHC: 31.9 g/dL (ref 30.0–36.0)
MCV: 95.3 fL (ref 80.0–100.0)
Monocytes Absolute: 0.3 10*3/uL (ref 0.1–1.0)
Monocytes Relative: 6 %
NRBC: 0 % (ref 0.0–0.2)
Neutro Abs: 4.6 10*3/uL (ref 1.7–7.7)
Neutrophils Relative %: 75 %
Platelet Count: 446 10*3/uL — ABNORMAL HIGH (ref 150–400)
RBC: 4.25 MIL/uL (ref 3.87–5.11)
RDW: 14.8 % (ref 11.5–15.5)
WBC Count: 6.1 10*3/uL (ref 4.0–10.5)

## 2018-09-08 LAB — RETICULOCYTES
Immature Retic Fract: 10.4 % (ref 2.3–15.9)
RBC.: 4.25 MIL/uL (ref 3.87–5.11)
Retic Count, Absolute: 65 10*3/uL (ref 19.0–186.0)
Retic Ct Pct: 1.5 % (ref 0.4–3.1)

## 2018-09-08 MED ORDER — PREDNISONE 5 MG PO TABS: 5.0000 mg | ORAL_TABLET | Freq: Every day | ORAL | 0 refills | Status: DC

## 2018-09-08 NOTE — Telephone Encounter (Signed)
-----   Message from Ladell Pier, MD sent at 09/08/2018  1:42 PM EST ----- Please call patient, hb is normal, decrease prednisone to 5mg  daily

## 2018-09-08 NOTE — Telephone Encounter (Signed)
Patient notified of lab results and to decrease Prednisone to 5 mg daily. She reports being anxious about this, but she will do as instructed. Follow up as scheduled.

## 2018-09-13 ENCOUNTER — Other Ambulatory Visit: Payer: Self-pay | Admitting: Nurse Practitioner

## 2018-09-22 ENCOUNTER — Ambulatory Visit
Admission: RE | Admit: 2018-09-22 | Discharge: 2018-09-22 | Disposition: A | Discharge status: Home / Self Care | Payer: Medicare Other | Source: Ambulatory Visit | Attending: Family Medicine | Admitting: Family Medicine

## 2018-09-22 DIAGNOSIS — Z1231 Encounter for screening mammogram for malignant neoplasm of breast: Secondary | ICD-10-CM | POA: Diagnosis not present

## 2018-09-29 ENCOUNTER — Encounter: Payer: Self-pay | Admitting: Nurse Practitioner

## 2018-09-29 ENCOUNTER — Inpatient Hospital Stay: Payer: Medicare Other | Attending: Nurse Practitioner

## 2018-09-29 ENCOUNTER — Inpatient Hospital Stay (HOSPITAL_BASED_OUTPATIENT_CLINIC_OR_DEPARTMENT_OTHER): Payer: Medicare Other | Admitting: Nurse Practitioner

## 2018-09-29 ENCOUNTER — Telehealth: Payer: Self-pay | Admitting: Nurse Practitioner

## 2018-09-29 VITALS — BP 150/75 | HR 71 | Temp 98.6°F | Resp 18 | Ht 64.0 in | Wt 177.4 lb

## 2018-09-29 DIAGNOSIS — D591 Autoimmune hemolytic anemia, unspecified: Secondary | ICD-10-CM

## 2018-09-29 DIAGNOSIS — Z9081 Acquired absence of spleen: Secondary | ICD-10-CM

## 2018-09-29 DIAGNOSIS — Z7952 Long term (current) use of systemic steroids: Secondary | ICD-10-CM | POA: Diagnosis not present

## 2018-09-29 LAB — CBC WITH DIFFERENTIAL (CANCER CENTER ONLY)
Abs Immature Granulocytes: 0.02 10*3/uL (ref 0.00–0.07)
Basophils Absolute: 0.3 10*3/uL — ABNORMAL HIGH (ref 0.0–0.1)
Basophils Relative: 3 %
EOS ABS: 0.2 10*3/uL (ref 0.0–0.5)
Eosinophils Relative: 2 %
HCT: 42.3 % (ref 36.0–46.0)
Hemoglobin: 13.4 g/dL (ref 12.0–15.0)
Immature Granulocytes: 0 %
LYMPHS ABS: 1.8 10*3/uL (ref 0.7–4.0)
Lymphocytes Relative: 23 %
MCH: 30.1 pg (ref 26.0–34.0)
MCHC: 31.7 g/dL (ref 30.0–36.0)
MCV: 95.1 fL (ref 80.0–100.0)
Monocytes Absolute: 0.5 10*3/uL (ref 0.1–1.0)
Monocytes Relative: 6 %
Neutro Abs: 5 10*3/uL (ref 1.7–7.7)
Neutrophils Relative %: 66 %
Platelet Count: 422 10*3/uL — ABNORMAL HIGH (ref 150–400)
RBC: 4.45 MIL/uL (ref 3.87–5.11)
RDW: 14.4 % (ref 11.5–15.5)
WBC Count: 7.7 10*3/uL (ref 4.0–10.5)
nRBC: 0 % (ref 0.0–0.2)

## 2018-09-29 LAB — RETICULOCYTES
Immature Retic Fract: 8.2 % (ref 2.3–15.9)
RBC.: 4.45 MIL/uL (ref 3.87–5.11)
RETIC COUNT ABSOLUTE: 95.7 10*3/uL (ref 19.0–186.0)
Retic Ct Pct: 2.2 % (ref 0.4–3.1)

## 2018-09-29 MED ORDER — PREDNISONE 5 MG PO TABS: 5.0000 mg | ORAL_TABLET | Freq: Every day | ORAL | 0 refills | Status: DC

## 2018-09-29 NOTE — Progress Notes (Addendum)
Michelle Wilcox OFFICE PROGRESS NOTE   Diagnosis: Hemolytic anemia  INTERVAL HISTORY:   Michelle Wilcox returns as scheduled.  Prednisone tapered to 5 mg daily 09/08/2018.  She feels well.  She has a good appetite and good energy level.  No interim illnesses or infections.  No abdominal pain.  Objective:  Vital signs in last 24 hours:  Blood pressure (!) 150/75, pulse 71, temperature 98.6 F (37 C), temperature source Oral, resp. rate 18, height 5\' 4"  (1.626 m), weight 177 lb 6.4 oz (80.5 kg), SpO2 97 %.    HEENT: No thrush or ulcers. Resp: Lungs clear bilaterally. Cardio: Regular rate and rhythm. GI: Abdomen soft and nontender.  No hepatomegaly.  Healed surgical incisions. Vascular: No leg edema.   Lab Results:  Lab Results  Component Value Date   WBC 7.7 09/29/2018   HGB 13.4 09/29/2018   HCT 42.3 09/29/2018   MCV 95.1 09/29/2018   PLT 422 (H) 09/29/2018   NEUTROABS 5.0 09/29/2018    Imaging:  No results found.  Medications: I have reviewed the patient's current medications.  Assessment/Plan: 1. 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5 mg daily 01/28/2017  Prednisone taper to 10 mg daily 02/19/2017  Prednisone taper to 7.5 mg daily 04/01/2017  Prednisone taper to 5 mg daily 05/20/2017  Admission with  progressive anemia and evidence of hemolysis 08/03/2017  Prednisone resumed at a dose of 60 mg daily  Prednisone taper to 40 mg 08/12/2017  Prednisone taper to 30 mg daily 08/24/2017  Prednisone taper to 20 mg daily 09/03/2017  Prednisone taper to 15 mg daily 09/20/2017  Prednisone taper to 12.5 mg daily 10/14/2017  Pednisone taper to 10 mg daily 11/05/2017  Progressiveanemia 12/16/2017  Prednisone increased to 20 mg daily 12/16/2017  Prednisone decreased to 15 mg daily 01/06/2018  Prednisone decreased to 12.5 mg daily 02/17/2018  Prednisone taper to 12.5 mg alternating with 10 mg daily 03/17/2018  Splenectomy 06/18/2018  Prednisone decreased to 10 mg daily 07/07/2018  Prednisone decreased to 7.5 mg daily 07/30/2018  Prednisone changed to 7.5 alternating with 5 mg daily 08/18/2018  Prednisone changed to 5 mg daily 09/08/2018  2. History ofExertional dyspnea secondary to #1  3. Mild splenomegaly on CT 03/16/2016  4. History of polymyalgia rheumatica  5. History of giant cell/temporal arteritis-maintained on methotrexate in the remote past, now on low dose prednisone  6.Admission with leg edema 08/03/2017-diastolic heart failure?, Exacerbated by anemia  7.Right arm superficial phlebitis 03/17/2018, resolved    Disposition: Michelle Wilcox appears stable.  The hemoglobin remains in normal range.  She will continue prednisone at a dose of 5 mg daily with no further taper planned.    She will return for lab in 2 months and 4 months.  She will be seen in follow-up with labs in 6 months.  She will contact the office in the interim with any problems.  Patient seen with Dr. Benay Spice.    Ned Card ANP/GNP-BC   09/29/2018  3:08 PM  This was a shared visit with Ned Card.  The hemoglobin remains in the normal range with a prednisone taper.  The prednisone is now at the chronic low dose she uses for polymyalgia rheumatica.  She will continue prednisone  at a dose of 5 mg daily.  Michelle Wilcox will be scheduled for a 52-month office visit.  [name], MD

## 2018-09-29 NOTE — Telephone Encounter (Signed)
Scheduled appt per 2/10 los. ° °Printed calendar and avs. °

## 2018-10-27 ENCOUNTER — Telehealth: Payer: Self-pay | Admitting: *Deleted

## 2018-10-27 DIAGNOSIS — Z6830 Body mass index (BMI) 30.0-30.9, adult: Secondary | ICD-10-CM | POA: Diagnosis not present

## 2018-10-27 DIAGNOSIS — D591 Other autoimmune hemolytic anemias: Secondary | ICD-10-CM | POA: Diagnosis not present

## 2018-10-27 DIAGNOSIS — M315 Giant cell arteritis with polymyalgia rheumatica: Secondary | ICD-10-CM | POA: Diagnosis not present

## 2018-10-27 DIAGNOSIS — M353 Polymyalgia rheumatica: Secondary | ICD-10-CM | POA: Diagnosis not present

## 2018-10-27 DIAGNOSIS — M2550 Pain in unspecified joint: Secondary | ICD-10-CM | POA: Diagnosis not present

## 2018-10-27 DIAGNOSIS — E669 Obesity, unspecified: Secondary | ICD-10-CM | POA: Diagnosis not present

## 2018-10-27 DIAGNOSIS — Z79899 Other long term (current) drug therapy: Secondary | ICD-10-CM | POA: Diagnosis not present

## 2018-10-27 DIAGNOSIS — M150 Primary generalized (osteo)arthritis: Secondary | ICD-10-CM | POA: Diagnosis not present

## 2018-10-27 NOTE — Telephone Encounter (Signed)
Has cruise with [place] in April and requesting MD to dictate a letter that it is medically inadvisable for her to take the cruise at this time since she is immunocompromised due to having spleen removed as well has her diagnosis of CHF. Needs letter this week and husband will pick it up. Her name on the cruise is listed as Michelle Wilcox.

## 2018-10-28 ENCOUNTER — Encounter: Payer: Self-pay | Admitting: Nurse Practitioner

## 2018-10-28 ENCOUNTER — Telehealth: Payer: Self-pay | Admitting: *Deleted

## 2018-10-28 NOTE — Telephone Encounter (Signed)
Medical records faxed to [facility]; [place] [phone]

## 2018-11-04 ENCOUNTER — Other Ambulatory Visit: Payer: Self-pay | Admitting: Oncology

## 2018-11-04 DIAGNOSIS — D591 Autoimmune hemolytic anemia, unspecified: Secondary | ICD-10-CM

## 2018-11-12 ENCOUNTER — Telehealth: Payer: Self-pay | Admitting: *Deleted

## 2018-11-12 NOTE — Telephone Encounter (Signed)
Called to request a letter from MD that she should stay at home due to her compromised immune system and for what time period he suggests. She has still been going in to work and is not able to completely isolate herself there from other employees. She will attempt to locate fax # to send letter to HR department if her husband is not allowed to pick up the letter.

## 2018-11-12 NOTE — Telephone Encounter (Signed)
"  Michelle Wilcox [phone]).  Letter needed from Dr. Benay Spice indicating I must stay home during the [facility] Virus pandemic because of low immunity."

## 2018-12-08 ENCOUNTER — Other Ambulatory Visit: Payer: Self-pay

## 2018-12-29 ENCOUNTER — Other Ambulatory Visit: Payer: Self-pay | Admitting: Nurse Practitioner

## 2018-12-29 DIAGNOSIS — D591 Autoimmune hemolytic anemia, unspecified: Secondary | ICD-10-CM

## 2018-12-30 ENCOUNTER — Other Ambulatory Visit: Payer: Self-pay | Admitting: Nurse Practitioner

## 2018-12-30 DIAGNOSIS — D591 Autoimmune hemolytic anemia, unspecified: Secondary | ICD-10-CM

## 2018-12-30 MED ORDER — PREDNISONE 5 MG PO TABS: 5.0000 mg | ORAL_TABLET | Freq: Every day | ORAL | 0 refills | Status: DC

## 2019-01-23 ENCOUNTER — Telehealth: Payer: Self-pay | Admitting: *Deleted

## 2019-01-23 NOTE — Telephone Encounter (Signed)
Called to report she canceled her appointment for next week until COVID situation has improved. She is doing well. MD notified.

## 2019-01-26 ENCOUNTER — Other Ambulatory Visit: Payer: Self-pay

## 2019-02-17 ENCOUNTER — Other Ambulatory Visit: Payer: Self-pay | Admitting: Oncology

## 2019-02-17 DIAGNOSIS — D591 Autoimmune hemolytic anemia, unspecified: Secondary | ICD-10-CM

## 2019-02-23 DIAGNOSIS — M353 Polymyalgia rheumatica: Secondary | ICD-10-CM | POA: Diagnosis not present

## 2019-02-23 DIAGNOSIS — I10 Essential (primary) hypertension: Secondary | ICD-10-CM | POA: Diagnosis not present

## 2019-02-23 DIAGNOSIS — Z0000 Encounter for general adult medical examination without abnormal findings: Secondary | ICD-10-CM | POA: Diagnosis not present

## 2019-02-23 DIAGNOSIS — D591 Other autoimmune hemolytic anemias: Secondary | ICD-10-CM | POA: Diagnosis not present

## 2019-02-23 DIAGNOSIS — F418 Other specified anxiety disorders: Secondary | ICD-10-CM | POA: Diagnosis not present

## 2019-02-23 DIAGNOSIS — E785 Hyperlipidemia, unspecified: Secondary | ICD-10-CM | POA: Diagnosis not present

## 2019-02-23 DIAGNOSIS — Z6830 Body mass index (BMI) 30.0-30.9, adult: Secondary | ICD-10-CM | POA: Diagnosis not present

## 2019-02-23 DIAGNOSIS — M85852 Other specified disorders of bone density and structure, left thigh: Secondary | ICD-10-CM | POA: Diagnosis not present

## 2019-02-23 DIAGNOSIS — I5032 Chronic diastolic (congestive) heart failure: Secondary | ICD-10-CM | POA: Diagnosis not present

## 2019-03-25 ENCOUNTER — Other Ambulatory Visit: Payer: Self-pay | Admitting: Nurse Practitioner

## 2019-03-25 DIAGNOSIS — D591 Autoimmune hemolytic anemia, unspecified: Secondary | ICD-10-CM

## 2019-03-26 ENCOUNTER — Telehealth: Payer: Self-pay | Admitting: *Deleted

## 2019-03-26 NOTE — Telephone Encounter (Signed)
Called to cancel lab/OV for 03/30/19 and wants to schedule a WebEx/Virtual appointment on another day.

## 2019-03-30 ENCOUNTER — Other Ambulatory Visit: Payer: Self-pay

## 2019-03-30 ENCOUNTER — Ambulatory Visit: Payer: Self-pay | Admitting: Oncology

## 2019-03-31 ENCOUNTER — Telehealth: Payer: Self-pay | Admitting: *Deleted

## 2019-03-31 NOTE — Telephone Encounter (Signed)
Called patient to f/u on request for WebEx/Virtual appointment with Dr. Benay Spice instead of coming in office. Upon further discussion she does not have MyChart or a computer in her home, so this is not possible. She would prefer to wait till later when COVID is more under control before coming in. She has appointment in October with Dr. Addison Lank and is willing to have the CBC done there. She is feeing well: weight stable, energy good, BP normal. Per Dr. Benay Spice: OK to delay lab to October with Dr. Addison Lank and call when ready to be seen in office. Faxed order to Dr. Baldomero Lamy office for CBC/diff at October visit and fax results.

## 2019-04-28 DIAGNOSIS — Z23 Encounter for immunization: Secondary | ICD-10-CM | POA: Diagnosis not present

## 2019-05-19 DIAGNOSIS — E785 Hyperlipidemia, unspecified: Secondary | ICD-10-CM | POA: Diagnosis not present

## 2019-05-19 DIAGNOSIS — I10 Essential (primary) hypertension: Secondary | ICD-10-CM | POA: Diagnosis not present

## 2019-05-19 DIAGNOSIS — J45909 Unspecified asthma, uncomplicated: Secondary | ICD-10-CM | POA: Diagnosis not present

## 2019-05-19 DIAGNOSIS — I5032 Chronic diastolic (congestive) heart failure: Secondary | ICD-10-CM | POA: Diagnosis not present

## 2019-05-19 DIAGNOSIS — D591 Other autoimmune hemolytic anemias: Secondary | ICD-10-CM | POA: Diagnosis not present

## 2019-05-26 DIAGNOSIS — D591 Autoimmune hemolytic anemia, unspecified: Secondary | ICD-10-CM | POA: Diagnosis not present

## 2019-05-26 DIAGNOSIS — M150 Primary generalized (osteo)arthritis: Secondary | ICD-10-CM | POA: Diagnosis not present

## 2019-05-26 DIAGNOSIS — M2550 Pain in unspecified joint: Secondary | ICD-10-CM | POA: Diagnosis not present

## 2019-05-26 DIAGNOSIS — M315 Giant cell arteritis with polymyalgia rheumatica: Secondary | ICD-10-CM | POA: Diagnosis not present

## 2019-05-26 DIAGNOSIS — M353 Polymyalgia rheumatica: Secondary | ICD-10-CM | POA: Diagnosis not present

## 2019-06-15 DIAGNOSIS — D591 Autoimmune hemolytic anemia, unspecified: Secondary | ICD-10-CM | POA: Diagnosis not present

## 2019-06-15 DIAGNOSIS — I5032 Chronic diastolic (congestive) heart failure: Secondary | ICD-10-CM | POA: Diagnosis not present

## 2019-06-15 DIAGNOSIS — J301 Allergic rhinitis due to pollen: Secondary | ICD-10-CM | POA: Diagnosis not present

## 2019-06-15 DIAGNOSIS — Z0000 Encounter for general adult medical examination without abnormal findings: Secondary | ICD-10-CM | POA: Diagnosis not present

## 2019-06-15 DIAGNOSIS — F419 Anxiety disorder, unspecified: Secondary | ICD-10-CM | POA: Diagnosis not present

## 2019-06-15 DIAGNOSIS — M85852 Other specified disorders of bone density and structure, left thigh: Secondary | ICD-10-CM | POA: Diagnosis not present

## 2019-06-15 DIAGNOSIS — I10 Essential (primary) hypertension: Secondary | ICD-10-CM | POA: Diagnosis not present

## 2019-06-15 DIAGNOSIS — R197 Diarrhea, unspecified: Secondary | ICD-10-CM | POA: Diagnosis not present

## 2019-06-15 DIAGNOSIS — E785 Hyperlipidemia, unspecified: Secondary | ICD-10-CM | POA: Diagnosis not present

## 2019-06-15 DIAGNOSIS — E559 Vitamin D deficiency, unspecified: Secondary | ICD-10-CM | POA: Diagnosis not present

## 2019-06-15 DIAGNOSIS — M353 Polymyalgia rheumatica: Secondary | ICD-10-CM | POA: Diagnosis not present

## 2019-06-22 ENCOUNTER — Telehealth: Payer: Self-pay | Admitting: *Deleted

## 2019-06-22 DIAGNOSIS — J45909 Unspecified asthma, uncomplicated: Secondary | ICD-10-CM | POA: Diagnosis not present

## 2019-06-22 DIAGNOSIS — M353 Polymyalgia rheumatica: Secondary | ICD-10-CM | POA: Diagnosis not present

## 2019-06-22 DIAGNOSIS — E673 Hypervitaminosis D: Secondary | ICD-10-CM | POA: Diagnosis not present

## 2019-06-22 DIAGNOSIS — F419 Anxiety disorder, unspecified: Secondary | ICD-10-CM | POA: Diagnosis not present

## 2019-06-22 DIAGNOSIS — F418 Other specified anxiety disorders: Secondary | ICD-10-CM | POA: Diagnosis not present

## 2019-06-22 DIAGNOSIS — E785 Hyperlipidemia, unspecified: Secondary | ICD-10-CM | POA: Diagnosis not present

## 2019-06-22 DIAGNOSIS — I10 Essential (primary) hypertension: Secondary | ICD-10-CM | POA: Diagnosis not present

## 2019-06-22 DIAGNOSIS — D591 Autoimmune hemolytic anemia, unspecified: Secondary | ICD-10-CM

## 2019-06-22 DIAGNOSIS — G479 Sleep disorder, unspecified: Secondary | ICD-10-CM | POA: Diagnosis not present

## 2019-06-22 DIAGNOSIS — M85852 Other specified disorders of bone density and structure, left thigh: Secondary | ICD-10-CM | POA: Diagnosis not present

## 2019-06-22 MED ORDER — PREDNISONE 5 MG PO TABS: ORAL_TABLET | ORAL | 0 refills | Status: DC

## 2019-06-22 NOTE — Telephone Encounter (Signed)
Dr. Benay Spice reviewed CBC from [place] at [facility] from 06/22/2019. Continue same prednisone per rheumatology. She is requesting refill on prednisone, since Dr. Benay Spice has been doing this in the past. Refill sent to pharmacy and encouraged her to make a f/u appointment when she is comfortable coming in the office again.

## 2019-09-10 DIAGNOSIS — J45909 Unspecified asthma, uncomplicated: Secondary | ICD-10-CM | POA: Diagnosis not present

## 2019-09-10 DIAGNOSIS — I5032 Chronic diastolic (congestive) heart failure: Secondary | ICD-10-CM | POA: Diagnosis not present

## 2019-09-10 DIAGNOSIS — E785 Hyperlipidemia, unspecified: Secondary | ICD-10-CM | POA: Diagnosis not present

## 2019-09-10 DIAGNOSIS — I10 Essential (primary) hypertension: Secondary | ICD-10-CM | POA: Diagnosis not present

## 2019-09-13 ENCOUNTER — Ambulatory Visit: Payer: Medicare Other | Attending: Internal Medicine

## 2019-09-13 DIAGNOSIS — Z23 Encounter for immunization: Secondary | ICD-10-CM | POA: Insufficient documentation

## 2019-09-13 NOTE — Progress Notes (Signed)
   Covid-19 Vaccination Clinic  Name:  Michelle Wilcox    MRN: [id number] DOB: 1946-07-29  09/13/2019  Michelle Wilcox was observed post Covid-19 immunization for 15 minutes without incidence. She was provided with Vaccine Information Sheet and instruction to access the V-Safe system.   Michelle Wilcox was instructed to call 911 with any severe reactions post vaccine: Marland Kitchen Difficulty breathing  . Swelling of your face and throat  . A fast heartbeat  . A bad rash all over your body  . Dizziness and weakness    Immunizations Administered    Name Date Dose VIS Date Route   Pfizer COVID-19 Vaccine 09/13/2019  2:54 PM 0.3 mL 07/31/2019 Intramuscular   Manufacturer: [place]   Lot: [id number]   [place]: [id number]

## 2019-09-21 ENCOUNTER — Other Ambulatory Visit: Payer: Self-pay | Admitting: Oncology

## 2019-09-21 ENCOUNTER — Telehealth: Payer: Self-pay | Admitting: *Deleted

## 2019-09-21 DIAGNOSIS — D591 Autoimmune hemolytic anemia, unspecified: Secondary | ICD-10-CM

## 2019-09-21 NOTE — Telephone Encounter (Signed)
Called to alert that pharmacy will be calling for her prednisone refill. Informed her it has already been filled and MD wants to see her in 1-2 months. She is requesting virtual visit instead of in person due to [place] concerns. Scheduling message sent with her updated request. She reports she will receive her 2nd dose of vaccine on 10/05/19. Encouraged her to be cautious at work, and she reports they are closed to the public, she is in office alone and she still wears mask.

## 2019-09-21 NOTE — Telephone Encounter (Signed)
Per Dr. Benay Spice, refilled pt prednisone but advised to f/u in 1-2 months to see him or Ned Card, NP. Called pt but mailbox was full. Will send scheduling a message.

## 2019-09-25 ENCOUNTER — Telehealth: Payer: Self-pay | Admitting: Oncology

## 2019-09-25 NOTE — Telephone Encounter (Signed)
Scheduled appt per 2/1 sch message - mailed letter with appt date and time   

## 2019-10-05 ENCOUNTER — Ambulatory Visit: Payer: Medicare Other | Attending: Internal Medicine

## 2019-10-05 DIAGNOSIS — Z23 Encounter for immunization: Secondary | ICD-10-CM | POA: Insufficient documentation

## 2019-10-05 NOTE — Progress Notes (Signed)
   Covid-19 Vaccination Clinic  Name:  Michelle Wilcox    MRN: [id number] DOB: May 08, 1946  10/05/2019  [name] was observed post Covid-19 immunization for 15 minutes without incidence. She was provided with Vaccine Information Sheet and instruction to access the V-Safe system.   [name] was instructed to call 911 with any severe reactions post vaccine: Marland Kitchen Difficulty breathing  . Swelling of your face and throat  . A fast heartbeat  . A bad rash all over your body  . Dizziness and weakness    Immunizations Administered    Name Date Dose VIS Date Route   Pfizer COVID-19 Vaccine 10/05/2019  2:13 PM 0.3 mL 07/31/2019 Intramuscular   Manufacturer: [place]   Lot: [id number]   [employer]: [id number]

## 2019-10-22 ENCOUNTER — Telehealth: Payer: Self-pay | Admitting: Registered Nurse

## 2019-10-22 ENCOUNTER — Encounter: Payer: Self-pay | Admitting: Registered Nurse

## 2019-10-22 NOTE — Telephone Encounter (Signed)
Noted patient did not answer via work contact methods today

## 2019-10-22 NOTE — Telephone Encounter (Signed)
Notified patient sustained laceration yesterday at her desk at work from box cutter to left thumb.  First aid performed cleaned with alcohol and bandaid applied.  Reivewed epic and paper chart for last tetanus vaccination.  None noted in paper chart and none noted in epic please verify with patient last known dose and if greater than 10 years patient to have tetanus booster.

## 2019-10-22 NOTE — Telephone Encounter (Signed)
Noted. Attempted to contact pt by phone and overhead page. No response. Email sent requesting her tetanus vacc status. Awaiting response.

## 2019-10-23 ENCOUNTER — Encounter: Payer: Self-pay | Admitting: Adult Health

## 2019-10-23 ENCOUNTER — Other Ambulatory Visit: Payer: Self-pay

## 2019-10-23 ENCOUNTER — Ambulatory Visit: Payer: Self-pay | Admitting: *Deleted

## 2019-10-23 DIAGNOSIS — Z23 Encounter for immunization: Secondary | ICD-10-CM

## 2019-10-23 DIAGNOSIS — S61032A Puncture wound without foreign body of left thumb without damage to nail, initial encounter: Secondary | ICD-10-CM

## 2019-10-23 NOTE — Progress Notes (Signed)
TDap vaccine post puncture wound to L thumb with box cutter approx 48hrs ago. Very small puncture wound noted to L anterior thumb, just proximal to MCP. Pt reports wound bled a lot at time of injury and bruised surrounding area approx 3cm circumferentially. Appears that puncture is located over a superficial vein and explained to pt that this is likely why the wound bled more than she expected and leaked into surrounding area causing the large bruising. Bruising now improved per pt, almost resolved. Puncture now scabbed over and healing well.   She called her pcp office and was told last tetanus booster was in 2012, so Tdap administered today.

## 2019-10-23 NOTE — Telephone Encounter (Signed)
See RN encounter 10/23/19. Puncture wound evaluated and Tdap booster administered.

## 2019-10-25 NOTE — Telephone Encounter (Signed)
Noted agreed with plan of care and tdap booster

## 2019-10-26 ENCOUNTER — Telehealth (HOSPITAL_BASED_OUTPATIENT_CLINIC_OR_DEPARTMENT_OTHER): Payer: Medicare Other | Admitting: Oncology

## 2019-10-26 DIAGNOSIS — D591 Autoimmune hemolytic anemia, unspecified: Secondary | ICD-10-CM

## 2019-10-26 NOTE — Progress Notes (Signed)
[place] OFFICE VISIT PROGRESS NOTE  I connected with Michelle Wilcox on 10/26/19 at 12:30 PM EST by video and verified that I am speaking with the correct person using two identifiers.   I discussed the limitations, risks, security and privacy concerns of performing an evaluation and management service by telemedicine and the availability of in-person appointments. I also discussed with the patient that there may be a patient responsible charge related to this service. The patient expressed understanding and agreed to proceed.   Patient's location: Home Provider's location: Office   Diagnosis: Autoimmune hemolytic anemia  INTERVAL HISTORY:   Michelle Wilcox is seen today for a telehealth visit.  This is secondary to the Covid pandemic.  She reports feeling well.  No symptoms of anemia.  She has mild arthritis discomfort related to PMR.  She had a puncture wound at work last week and received a tetanus vaccine. She continues prednisone at a dose of 5 mg daily.  She has received the COVID-19 vaccine.    Lab Results:  Lab Results  Component Value Date   WBC 7.7 09/29/2018   HGB 13.4 09/29/2018   HCT 42.3 09/29/2018   MCV 95.1 09/29/2018   PLT 422 (H) 09/29/2018   NEUTROABS 5.0 09/29/2018   06/15/2019 at Dr. Leonides Schanz office: Hemoglobin 13.7, platelets 333,000, white count 8.2  Medications: I have reviewed the patient's current medications.  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, DAT IgG positive  Prednisone started 03/20/2016  Tapered to 15 mg daily on 04/30/2016  Hemoglobin lower, elevated LDH 05/21/2016-prednisone increased to 30 mgdaily  Prednisone taper to 25 mg daily 06/18/2016  Prednisone taper to 20 mg daily 07/16/2016  Prednisone taper to 15 mg daily 08/27/2016  Prednisone taper to 12.5 mg daily 09/25/2016  Hemoglobin lower, prednisone increased to 20  mgdaily on 10/11/2016  Cycle 1 rituximab 11/19/2016  Cycle 2 rituximab 11/26/2016  Cycle 3 rituximab 12/03/2016  Cycle 4 rituximab 12/28/2016  Prednisone taper to 15 mg daily 12/28/2016  Prednisone taper to 12.5 mg daily 01/28/2017  Prednisone taper to 10 mg daily 02/19/2017  Prednisone taper to 7.5 mg daily 04/01/2017  Prednisone taper to 5 mg daily 05/20/2017  Admission with progressive anemia and evidence of hemolysis 08/03/2017  Prednisone resumed at a dose of 60 mg daily  Prednisone taper to 40 mg 08/12/2017  Prednisone taper to 30 mg daily 08/24/2017  Prednisone taper to 20 mg daily 09/03/2017  Prednisone taper to 15 mg daily 09/20/2017  Prednisone taper to 12.5 mg daily 10/14/2017  Pednisone taper to 10 mg daily 11/05/2017  Progressiveanemia 12/16/2017  Prednisone increased to 20 mg daily 12/16/2017  Prednisone decreased to 15 mg daily 01/06/2018  Prednisone decreased to 12.5 mg daily 02/17/2018  Prednisone taper to 12.5 mg alternating with 10 mg daily 03/17/2018  Splenectomy 06/18/2018  Prednisone decreased to 10 mg daily 07/07/2018  Prednisone decreased to 7.5 mg daily 07/30/2018  Prednisone changed to 7.5 alternating with 5 mg daily 08/18/2018  Prednisone changed to 5 mg daily 09/08/2018  2. History ofExertional dyspnea secondary to #1  3. Mild splenomegaly on CT 03/16/2016  4. History of polymyalgia rheumatica  5. History of giant cell/temporal arteritis-maintained on methotrexate in the remote past, now on low dose prednisone  6.Admission with leg edema 08/03/2017-diastolic heart failure?, Exacerbated by anemia  7.Right arm superficial phlebitis 03/17/2018, resolved    Disposition: Michelle Wilcox appears stable.  The hemolytic anemia is in remission  after the splenectomy procedure in October 2019.  She will continue prednisone at a dose of 5 mg daily.  We will follow-up on the most recent CBC from Dr. Leonides Schanz.  We will request  a CBC be obtained within the next 2 months.  She will return for an office visit and CBC in 6 months.  She will call for symptoms of anemia.    I discussed the assessment and treatment plan with the patient. The patient was provided an opportunity to ask questions and all were answered. The patient agreed with the plan and demonstrated an understanding of the instructions.   The patient was advised to call back or seek an in-person evaluation if the symptoms worsen or if the condition fails to improve as anticipated.    Betsy Coder ANP/GNP-BC   10/26/2019 1:09 PM

## 2019-10-26 NOTE — Progress Notes (Deleted)
  [place] OFFICE PROGRESS NOTE   Diagnosis:   INTERVAL HISTORY:   ***  Objective:  Vital signs in last 24 hours:  There were no vitals taken for this visit.    HEENT: *** Lymphatics: *** Resp: *** Cardio: *** GI: *** Vascular: *** Neuro:***  Skin:***   Portacath/PICC-without erythema  Lab Results:  Lab Results  Component Value Date   WBC 7.7 09/29/2018   HGB 13.4 09/29/2018   HCT 42.3 09/29/2018   MCV 95.1 09/29/2018   PLT 422 (H) 09/29/2018   NEUTROABS 5.0 09/29/2018    CMP  Lab Results  Component Value Date   NA 144 06/19/2018   K 3.9 06/19/2018   CL 108 06/19/2018   CO2 29 06/19/2018   GLUCOSE 100 (H) 06/19/2018   BUN 9 06/19/2018   CREATININE 0.61 06/19/2018   CALCIUM 8.6 (L) 06/19/2018   PROT 5.8 (L) 04/07/2018   ALBUMIN 3.8 04/07/2018   AST 19 04/07/2018   ALT 10 04/07/2018   ALKPHOS 61 04/07/2018   BILITOT 0.5 04/07/2018   GFRNONAA >60 06/19/2018   GFRAA >60 06/19/2018    No results found for: CEA1  No results found for: INR  Imaging:  No results found.  Medications: I have reviewed the patient's current medications.   Assessment/Plan: . 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5 mg daily 01/28/2017  Prednisone taper to 10 mg daily 02/19/2017  Prednisone taper to 7.5  mg daily 04/01/2017  Prednisone taper to 5 mg daily 05/20/2017  Admission with progressive anemia and evidence of hemolysis 08/03/2017  Prednisone resumed at a dose of 60 mg daily  Prednisone taper to 40 mg 08/12/2017  Prednisone taper to 30 mg daily 08/24/2017  Prednisone taper to 20 mg daily 09/03/2017  Prednisone taper to 15 mg daily 09/20/2017  Prednisone taper to 12.5 mg daily 10/14/2017  Pednisone taper to 10 mg daily 11/05/2017  Progressiveanemia 12/16/2017  Prednisone increased to 20 mg daily 12/16/2017  Prednisone decreased to 15 mg daily 01/06/2018  Prednisone decreased to 12.5 mg daily 02/17/2018  Prednisone taper to 12.5 mg alternating with 10 mg daily 03/17/2018  Splenectomy 06/18/2018  Prednisone decreased to 10 mg daily 07/07/2018  Prednisone decreased to 7.5 mg daily 07/30/2018  Prednisone changed to 7.5 alternating with 5 mg daily 08/18/2018  Prednisone changed to 5 mg daily 09/08/2018  2. History ofExertional dyspnea secondary to #1  3. Mild splenomegaly on CT 03/16/2016  4. History of polymyalgia rheumatica  5. History of giant cell/temporal arteritis-maintained on methotrexate in the remote past, now on low dose prednisone  6.Admission with leg edema 08/03/2017-diastolic heart failure?, Exacerbated by anemia  7.Right arm superficial phlebitis 03/17/2018, resolved     Disposition: ***  Betsy Coder, MD  10/26/2019  12:59 PM

## 2019-11-10 ENCOUNTER — Telehealth: Payer: Self-pay | Admitting: Registered Nurse

## 2019-11-10 ENCOUNTER — Encounter: Payer: Self-pay | Admitting: Registered Nurse

## 2019-11-10 NOTE — Telephone Encounter (Signed)
Patient question regarding spouse laceration this weekend received tdap booster 12 days after his covid vaccination wondering if they have to repeat either of the vaccines.  Discussed with patient I would need to do some research and I would call her back once I had definitive answer for her.  Patient verbalized understanding information and had no further questions at this time.

## 2019-11-11 NOTE — Telephone Encounter (Signed)
Telephone message and my chart message sent to patient with CDC recommendation to give tdap if needed due to wound indication and no need to repeat or get additional shots but to follow manufacturer instructions for routine dosing at this time.  Patient may contact me via [email or web address] or [email or web address] today or call/walk in to clinic tomorrow when I am onsite 11-2 if further questions or concerns.

## 2019-11-16 DIAGNOSIS — I5032 Chronic diastolic (congestive) heart failure: Secondary | ICD-10-CM | POA: Diagnosis not present

## 2019-11-16 DIAGNOSIS — E785 Hyperlipidemia, unspecified: Secondary | ICD-10-CM | POA: Diagnosis not present

## 2019-11-16 DIAGNOSIS — I10 Essential (primary) hypertension: Secondary | ICD-10-CM | POA: Diagnosis not present

## 2019-11-16 DIAGNOSIS — J45909 Unspecified asthma, uncomplicated: Secondary | ICD-10-CM | POA: Diagnosis not present

## 2019-11-17 ENCOUNTER — Telehealth: Payer: Self-pay | Admitting: *Deleted

## 2019-11-17 NOTE — Telephone Encounter (Signed)
Left VM that Dr. Benay Spice received labs from [place] dated 06/15/2019. Needs CBC/diff in next month with Dr. Guido Sander office. Mailed script to home and notified patient via voice mail of Dr. Gearldine Shown request.

## 2019-12-24 ENCOUNTER — Telehealth: Payer: Self-pay | Admitting: *Deleted

## 2019-12-24 NOTE — Telephone Encounter (Signed)
Left VM to inquire if she had the CBC/diff done with Dr. Leonides Schanz yet?

## 2019-12-28 ENCOUNTER — Other Ambulatory Visit: Payer: Self-pay | Admitting: Oncology

## 2019-12-28 DIAGNOSIS — D591 Autoimmune hemolytic anemia, unspecified: Secondary | ICD-10-CM

## 2020-01-05 DIAGNOSIS — I5032 Chronic diastolic (congestive) heart failure: Secondary | ICD-10-CM | POA: Diagnosis not present

## 2020-01-05 DIAGNOSIS — J45909 Unspecified asthma, uncomplicated: Secondary | ICD-10-CM | POA: Diagnosis not present

## 2020-01-05 DIAGNOSIS — I10 Essential (primary) hypertension: Secondary | ICD-10-CM | POA: Diagnosis not present

## 2020-01-08 DIAGNOSIS — I10 Essential (primary) hypertension: Secondary | ICD-10-CM | POA: Diagnosis not present

## 2020-01-08 DIAGNOSIS — D591 Autoimmune hemolytic anemia, unspecified: Secondary | ICD-10-CM | POA: Diagnosis not present

## 2020-01-08 DIAGNOSIS — F418 Other specified anxiety disorders: Secondary | ICD-10-CM | POA: Diagnosis not present

## 2020-01-08 DIAGNOSIS — E785 Hyperlipidemia, unspecified: Secondary | ICD-10-CM | POA: Diagnosis not present

## 2020-01-08 DIAGNOSIS — M353 Polymyalgia rheumatica: Secondary | ICD-10-CM | POA: Diagnosis not present

## 2020-01-08 DIAGNOSIS — I5032 Chronic diastolic (congestive) heart failure: Secondary | ICD-10-CM | POA: Diagnosis not present

## 2020-01-08 DIAGNOSIS — E673 Hypervitaminosis D: Secondary | ICD-10-CM | POA: Diagnosis not present

## 2020-01-08 DIAGNOSIS — M899 Disorder of bone, unspecified: Secondary | ICD-10-CM | POA: Diagnosis not present

## 2020-01-08 DIAGNOSIS — M85852 Other specified disorders of bone density and structure, left thigh: Secondary | ICD-10-CM | POA: Diagnosis not present

## 2020-01-11 ENCOUNTER — Other Ambulatory Visit: Payer: Self-pay | Admitting: Family Medicine

## 2020-01-11 DIAGNOSIS — Z1231 Encounter for screening mammogram for malignant neoplasm of breast: Secondary | ICD-10-CM

## 2020-01-20 ENCOUNTER — Telehealth: Payer: Self-pay | Admitting: *Deleted

## 2020-01-20 NOTE — Telephone Encounter (Signed)
Left VM that Dr. Benay Spice reviewed labs/office note from 01/08/20 at [facility]. He recommends repeat CBC and office visit w/him in 4 months, or if she prefers it can be done w/Dr. Leonides Schanz. Requested return call to guide [place] in her wishes. Will mail script for CBC/diff to be drawn in 4 months if she prefers this with Dr. Leonides Schanz.

## 2020-01-28 ENCOUNTER — Telehealth: Payer: Self-pay

## 2020-01-28 NOTE — Telephone Encounter (Signed)
VM received from pt stating that she called Dr. Guido Sander office. The earliest Dr. Guido Sander office can do labs is at the pt's November 2021 appointment. Pt requests call back for further direction. She wants to know if she should she wait and have lab work done with her November appt or see MD Benay Spice and get her labs done.

## 2020-01-29 ENCOUNTER — Telehealth: Payer: Self-pay | Admitting: *Deleted

## 2020-01-29 NOTE — Telephone Encounter (Signed)
Left VM that Dr. Benay Spice prefers to see her with lab in September instead of waiting until November. Scheduling message sent.

## 2020-02-01 ENCOUNTER — Ambulatory Visit
Admission: RE | Admit: 2020-02-01 | Discharge: 2020-02-01 | Disposition: A | Discharge status: Home / Self Care | Payer: Medicare Other | Source: Ambulatory Visit | Attending: Family Medicine | Admitting: Family Medicine

## 2020-02-01 ENCOUNTER — Other Ambulatory Visit: Payer: Self-pay

## 2020-02-01 ENCOUNTER — Telehealth: Payer: Self-pay | Admitting: Oncology

## 2020-02-01 DIAGNOSIS — Z1231 Encounter for screening mammogram for malignant neoplasm of breast: Secondary | ICD-10-CM | POA: Diagnosis not present

## 2020-02-01 NOTE — Telephone Encounter (Signed)
Scheduled appt per 6/11 sch message- unable to reach pt . Left message with apt date and time

## 2020-02-08 DIAGNOSIS — I5032 Chronic diastolic (congestive) heart failure: Secondary | ICD-10-CM | POA: Diagnosis not present

## 2020-02-08 DIAGNOSIS — I10 Essential (primary) hypertension: Secondary | ICD-10-CM | POA: Diagnosis not present

## 2020-02-08 DIAGNOSIS — J45909 Unspecified asthma, uncomplicated: Secondary | ICD-10-CM | POA: Diagnosis not present

## 2020-02-12 NOTE — Telephone Encounter (Signed)
It looks like her last CBC was in October 2020  Needs a CBC here within the next few weeks  Office visit here and CBC in September

## 2020-02-15 ENCOUNTER — Telehealth: Payer: Self-pay | Admitting: Oncology

## 2020-02-15 ENCOUNTER — Telehealth: Payer: Self-pay | Admitting: *Deleted

## 2020-02-15 DIAGNOSIS — D591 Autoimmune hemolytic anemia, unspecified: Secondary | ICD-10-CM

## 2020-02-15 NOTE — Telephone Encounter (Signed)
Scheduled per 6/28 sch message. Left voicemail- appt added on 7/19.

## 2020-02-15 NOTE — Telephone Encounter (Signed)
Left message that Dr. Benay Spice is not comfortable waiting to have labs in November w/PCP. Would like her to have CBC here in few weeks and lab/OV w/him in September. Scheduling message sent.

## 2020-02-17 ENCOUNTER — Encounter: Payer: Self-pay | Admitting: *Deleted

## 2020-03-02 DIAGNOSIS — J45909 Unspecified asthma, uncomplicated: Secondary | ICD-10-CM | POA: Diagnosis not present

## 2020-03-02 DIAGNOSIS — I10 Essential (primary) hypertension: Secondary | ICD-10-CM | POA: Diagnosis not present

## 2020-03-02 DIAGNOSIS — I5032 Chronic diastolic (congestive) heart failure: Secondary | ICD-10-CM | POA: Diagnosis not present

## 2020-03-07 ENCOUNTER — Other Ambulatory Visit: Payer: Medicare Other

## 2020-03-07 DIAGNOSIS — M545 Low back pain: Secondary | ICD-10-CM | POA: Diagnosis not present

## 2020-03-07 DIAGNOSIS — M9905 Segmental and somatic dysfunction of pelvic region: Secondary | ICD-10-CM | POA: Diagnosis not present

## 2020-03-07 DIAGNOSIS — M9904 Segmental and somatic dysfunction of sacral region: Secondary | ICD-10-CM | POA: Diagnosis not present

## 2020-03-07 DIAGNOSIS — M9903 Segmental and somatic dysfunction of lumbar region: Secondary | ICD-10-CM | POA: Diagnosis not present

## 2020-03-14 DIAGNOSIS — I10 Essential (primary) hypertension: Secondary | ICD-10-CM | POA: Diagnosis not present

## 2020-03-14 DIAGNOSIS — M353 Polymyalgia rheumatica: Secondary | ICD-10-CM | POA: Diagnosis not present

## 2020-03-14 DIAGNOSIS — Z0000 Encounter for general adult medical examination without abnormal findings: Secondary | ICD-10-CM | POA: Diagnosis not present

## 2020-03-14 DIAGNOSIS — I5032 Chronic diastolic (congestive) heart failure: Secondary | ICD-10-CM | POA: Diagnosis not present

## 2020-03-14 DIAGNOSIS — M85852 Other specified disorders of bone density and structure, left thigh: Secondary | ICD-10-CM | POA: Diagnosis not present

## 2020-03-14 DIAGNOSIS — Z7189 Other specified counseling: Secondary | ICD-10-CM | POA: Diagnosis not present

## 2020-03-14 DIAGNOSIS — Z1389 Encounter for screening for other disorder: Secondary | ICD-10-CM | POA: Diagnosis not present

## 2020-03-16 ENCOUNTER — Other Ambulatory Visit: Payer: Self-pay | Admitting: Family Medicine

## 2020-03-16 DIAGNOSIS — Z7952 Long term (current) use of systemic steroids: Secondary | ICD-10-CM

## 2020-03-16 DIAGNOSIS — M85852 Other specified disorders of bone density and structure, left thigh: Secondary | ICD-10-CM

## 2020-03-18 ENCOUNTER — Other Ambulatory Visit: Payer: Self-pay | Admitting: Oncology

## 2020-03-18 DIAGNOSIS — D591 Autoimmune hemolytic anemia, unspecified: Secondary | ICD-10-CM

## 2020-03-21 DIAGNOSIS — M9903 Segmental and somatic dysfunction of lumbar region: Secondary | ICD-10-CM | POA: Diagnosis not present

## 2020-03-21 DIAGNOSIS — M9905 Segmental and somatic dysfunction of pelvic region: Secondary | ICD-10-CM | POA: Diagnosis not present

## 2020-03-21 DIAGNOSIS — M545 Low back pain: Secondary | ICD-10-CM | POA: Diagnosis not present

## 2020-03-21 DIAGNOSIS — M9904 Segmental and somatic dysfunction of sacral region: Secondary | ICD-10-CM | POA: Diagnosis not present

## 2020-03-29 ENCOUNTER — Other Ambulatory Visit: Payer: Self-pay | Admitting: Oncology

## 2020-03-29 ENCOUNTER — Other Ambulatory Visit: Payer: Self-pay | Admitting: *Deleted

## 2020-03-29 DIAGNOSIS — D591 Autoimmune hemolytic anemia, unspecified: Secondary | ICD-10-CM

## 2020-03-29 MED ORDER — PREDNISONE 5 MG PO TABS: ORAL_TABLET | ORAL | 0 refills | Status: DC

## 2020-03-31 ENCOUNTER — Telehealth: Payer: Self-pay | Admitting: *Deleted

## 2020-03-31 ENCOUNTER — Telehealth: Payer: Self-pay

## 2020-03-31 MED ORDER — PREDNISONE 5 MG PO TABS
5.0000 mg | ORAL_TABLET | Freq: Every day | ORAL | 0 refills | Status: DC
Start: 2020-03-31 — End: 2020-07-07

## 2020-03-31 NOTE — Telephone Encounter (Signed)
Since [facility] is still having computer issues, she agreed to allow 1 week supply of Prednisone be sent to [facility] at [address]. Script sent. Will f/u with [facility] Friday.

## 2020-03-31 NOTE — Telephone Encounter (Signed)
Patients current pharmacy is having problems filling her prescription of prednisone. Left a voice message for patient to choose another pharmacy to have her prescription filled.

## 2020-04-04 DIAGNOSIS — M545 Low back pain: Secondary | ICD-10-CM | POA: Diagnosis not present

## 2020-04-04 DIAGNOSIS — M9903 Segmental and somatic dysfunction of lumbar region: Secondary | ICD-10-CM | POA: Diagnosis not present

## 2020-04-04 DIAGNOSIS — M9904 Segmental and somatic dysfunction of sacral region: Secondary | ICD-10-CM | POA: Diagnosis not present

## 2020-04-04 DIAGNOSIS — M9905 Segmental and somatic dysfunction of pelvic region: Secondary | ICD-10-CM | POA: Diagnosis not present

## 2020-04-15 DIAGNOSIS — I10 Essential (primary) hypertension: Secondary | ICD-10-CM | POA: Diagnosis not present

## 2020-04-15 DIAGNOSIS — I5032 Chronic diastolic (congestive) heart failure: Secondary | ICD-10-CM | POA: Diagnosis not present

## 2020-04-15 DIAGNOSIS — J45909 Unspecified asthma, uncomplicated: Secondary | ICD-10-CM | POA: Diagnosis not present

## 2020-04-18 DIAGNOSIS — Z23 Encounter for immunization: Secondary | ICD-10-CM | POA: Diagnosis not present

## 2020-04-18 DIAGNOSIS — M9904 Segmental and somatic dysfunction of sacral region: Secondary | ICD-10-CM | POA: Diagnosis not present

## 2020-04-18 DIAGNOSIS — M9903 Segmental and somatic dysfunction of lumbar region: Secondary | ICD-10-CM | POA: Diagnosis not present

## 2020-04-18 DIAGNOSIS — Z7189 Other specified counseling: Secondary | ICD-10-CM | POA: Diagnosis not present

## 2020-04-18 DIAGNOSIS — M545 Low back pain: Secondary | ICD-10-CM | POA: Diagnosis not present

## 2020-04-18 DIAGNOSIS — M9905 Segmental and somatic dysfunction of pelvic region: Secondary | ICD-10-CM | POA: Diagnosis not present

## 2020-04-19 ENCOUNTER — Ambulatory Visit: Payer: Medicare Other | Attending: Critical Care Medicine

## 2020-04-19 DIAGNOSIS — Z23 Encounter for immunization: Secondary | ICD-10-CM

## 2020-04-19 NOTE — Progress Notes (Signed)
   Covid-19 Vaccination Clinic  Name:  Michelle Wilcox    MRN: [id number] DOB: 1946-01-11  04/19/2020  [name] was observed post Covid-19 immunization for 30 minutes based on pre-vaccination screening without incident. She was provided with Vaccine Information Sheet and instruction to access the V-Safe system.   [name] was instructed to call 911 with any severe reactions post vaccine: Marland Kitchen Difficulty breathing  . Swelling of face and throat  . A fast heartbeat  . A bad rash all over body  . Dizziness and weakness

## 2020-05-03 ENCOUNTER — Ambulatory Visit: Payer: Medicare Other | Admitting: Oncology

## 2020-05-03 ENCOUNTER — Other Ambulatory Visit: Payer: Medicare Other

## 2020-05-09 DIAGNOSIS — M9905 Segmental and somatic dysfunction of pelvic region: Secondary | ICD-10-CM | POA: Diagnosis not present

## 2020-05-09 DIAGNOSIS — M545 Low back pain: Secondary | ICD-10-CM | POA: Diagnosis not present

## 2020-05-09 DIAGNOSIS — M9904 Segmental and somatic dysfunction of sacral region: Secondary | ICD-10-CM | POA: Diagnosis not present

## 2020-05-09 DIAGNOSIS — M9903 Segmental and somatic dysfunction of lumbar region: Secondary | ICD-10-CM | POA: Diagnosis not present

## 2020-05-10 IMAGING — MG DIGITAL SCREENING BILATERAL MAMMOGRAM WITH TOMO AND CAD
8 series · 8 of 24 positions shown · non-contrast
Comparison: Previous exam(s).

CLINICAL DATA: Screening.

EXAM:
DIGITAL SCREENING BILATERAL MAMMOGRAM WITH TOMO AND CAD

[L CC synth-2D]
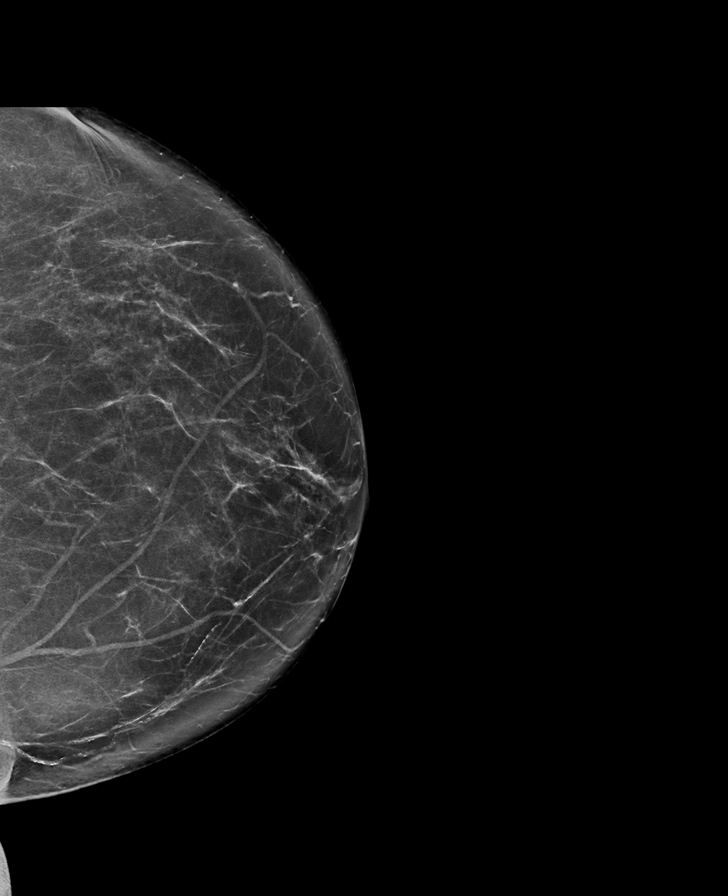

[R CC synth-2D]
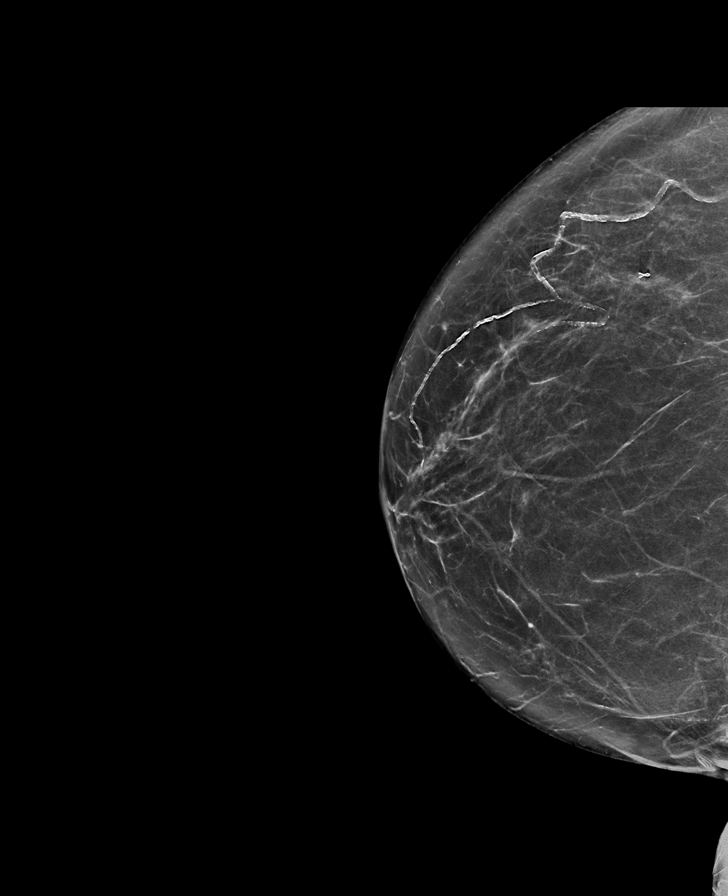

[R MLO synth-2D]
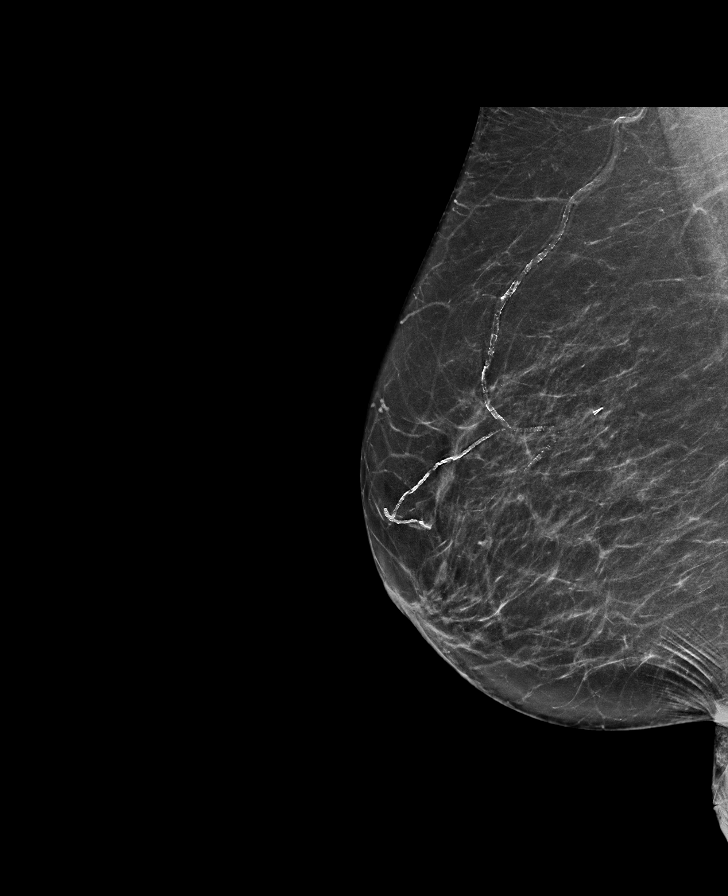

[L MLO synth-2D]
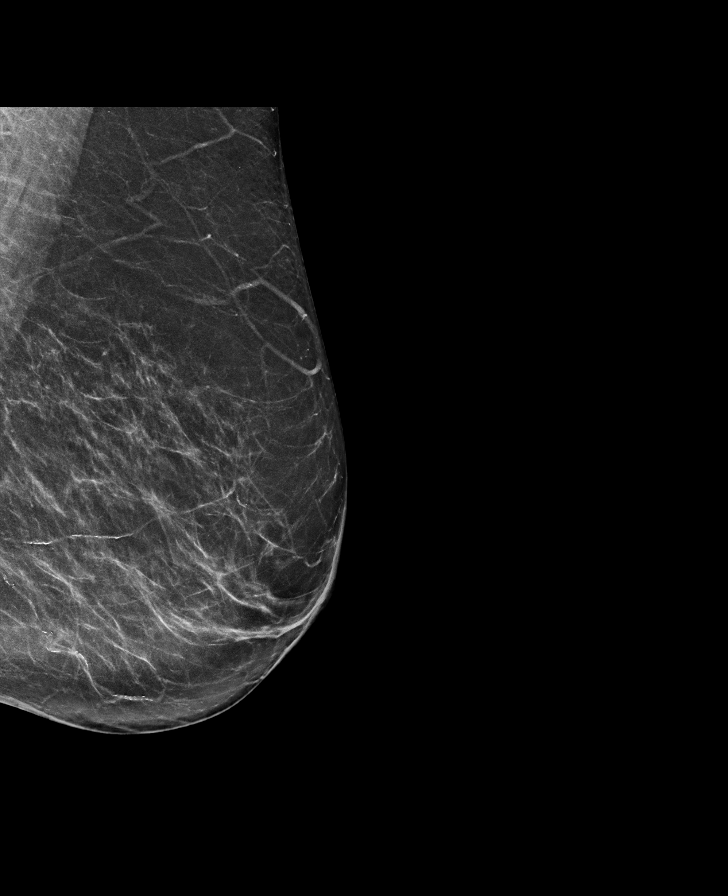

[R CC tomo · tomo slice 35/70.0]
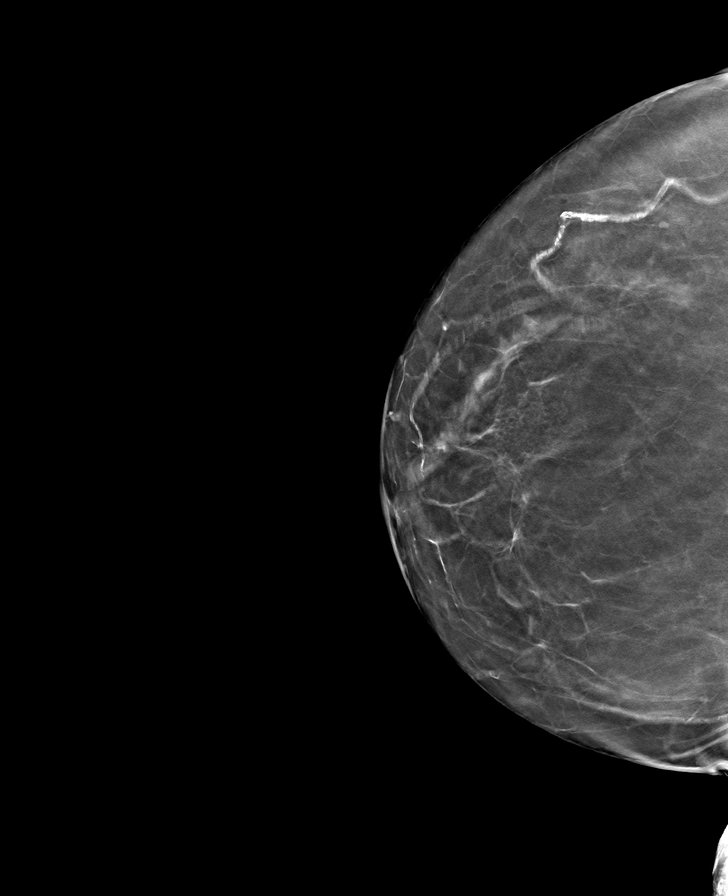

[L MLO tomo · tomo slice 37/74.0]
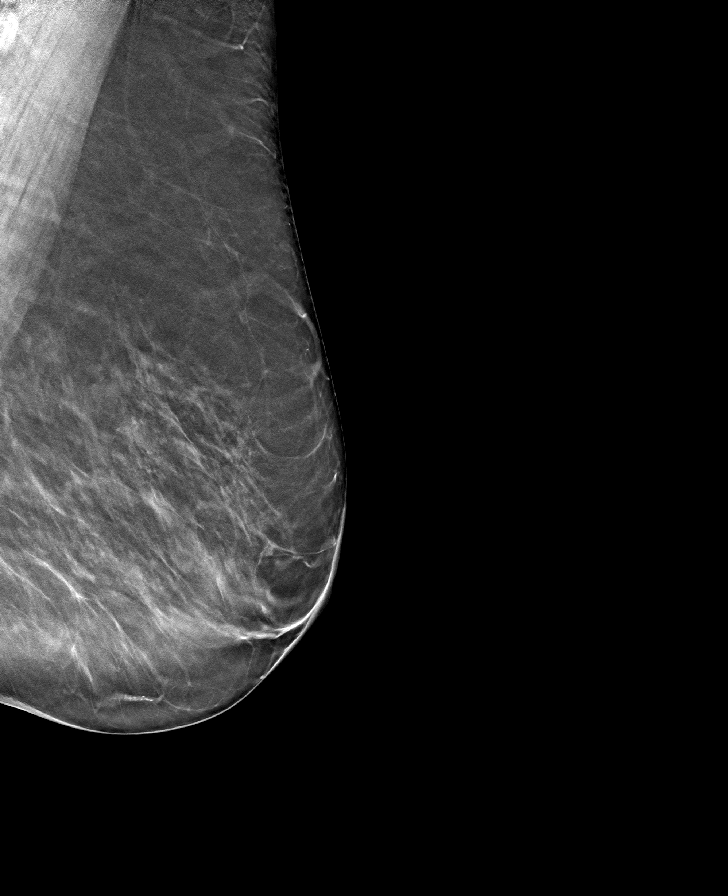

[R MLO tomo · tomo slice 37/74.0]
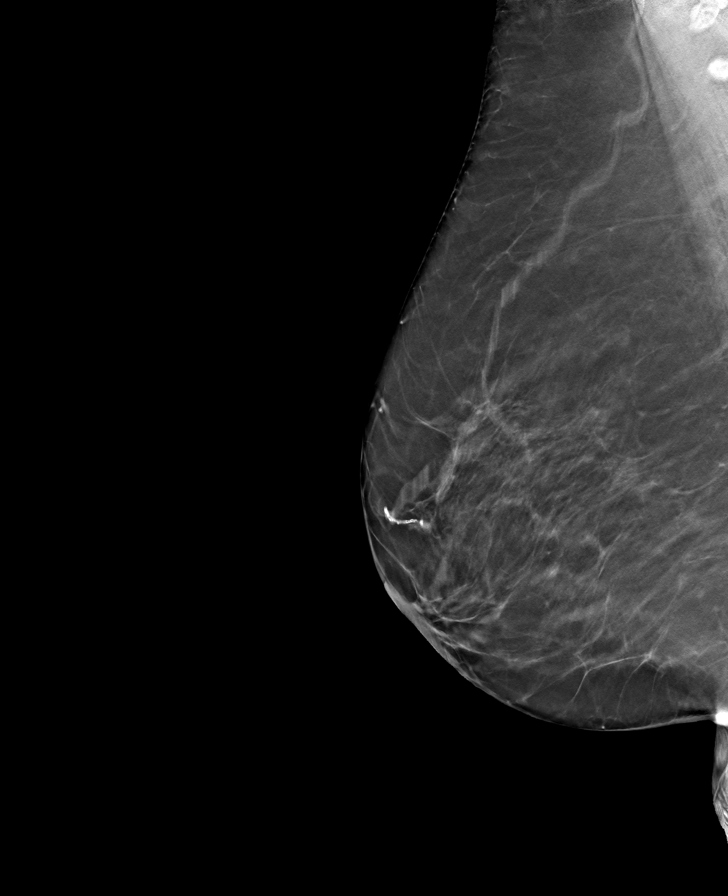

[L CC tomo · tomo slice 39/76.0]
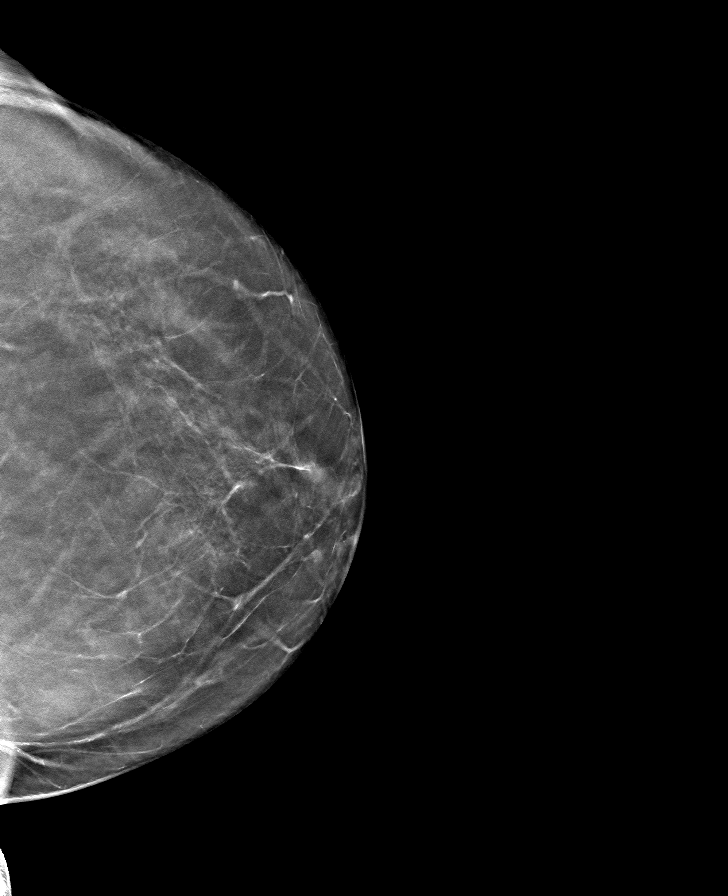

[8 of 24 positions shown; findings below may reference images not displayed]

ACR Breast Density Category b: There are scattered areas of
fibroglandular density.
FINDINGS: There are no findings suspicious for malignancy. Images were
processed with CAD.
IMPRESSION: No mammographic evidence of malignancy. A result letter of this
screening mammogram will be mailed directly to the patient.

RECOMMENDATION:
Screening mammogram in one year. (Code:[id number])

BI-RADS CATEGORY  1: Negative.

## 2020-05-11 DIAGNOSIS — I5032 Chronic diastolic (congestive) heart failure: Secondary | ICD-10-CM | POA: Diagnosis not present

## 2020-05-11 DIAGNOSIS — I10 Essential (primary) hypertension: Secondary | ICD-10-CM | POA: Diagnosis not present

## 2020-05-11 DIAGNOSIS — J45909 Unspecified asthma, uncomplicated: Secondary | ICD-10-CM | POA: Diagnosis not present

## 2020-05-16 ENCOUNTER — Inpatient Hospital Stay: Payer: Medicare Other | Attending: Oncology

## 2020-05-16 ENCOUNTER — Other Ambulatory Visit: Payer: Self-pay

## 2020-05-16 ENCOUNTER — Inpatient Hospital Stay (HOSPITAL_BASED_OUTPATIENT_CLINIC_OR_DEPARTMENT_OTHER): Payer: Medicare Other | Admitting: Oncology

## 2020-05-16 VITALS — BP 170/81 | HR 72 | Temp 97.9°F | Resp 18 | Ht 64.0 in | Wt 177.6 lb

## 2020-05-16 DIAGNOSIS — D5911 Warm autoimmune hemolytic anemia: Secondary | ICD-10-CM | POA: Diagnosis not present

## 2020-05-16 DIAGNOSIS — Z9081 Acquired absence of spleen: Secondary | ICD-10-CM | POA: Diagnosis not present

## 2020-05-16 DIAGNOSIS — Z79899 Other long term (current) drug therapy: Secondary | ICD-10-CM | POA: Insufficient documentation

## 2020-05-16 DIAGNOSIS — M353 Polymyalgia rheumatica: Secondary | ICD-10-CM | POA: Diagnosis not present

## 2020-05-16 DIAGNOSIS — D591 Autoimmune hemolytic anemia, unspecified: Secondary | ICD-10-CM

## 2020-05-16 DIAGNOSIS — Z23 Encounter for immunization: Secondary | ICD-10-CM

## 2020-05-16 LAB — CBC WITH DIFFERENTIAL (CANCER CENTER ONLY)
Abs Immature Granulocytes: 0.03 10*3/uL (ref 0.00–0.07)
Basophils Absolute: 0.1 10*3/uL (ref 0.0–0.1)
Basophils Relative: 1 %
Eosinophils Absolute: 0.3 10*3/uL (ref 0.0–0.5)
Eosinophils Relative: 3 %
HCT: 41.3 % (ref 36.0–46.0)
Hemoglobin: 13.4 g/dL (ref 12.0–15.0)
Immature Granulocytes: 0 %
Lymphocytes Relative: 14 %
Lymphs Abs: 1.8 10*3/uL (ref 0.7–4.0)
MCH: 31.1 pg (ref 26.0–34.0)
MCHC: 32.4 g/dL (ref 30.0–36.0)
MCV: 95.8 fL (ref 80.0–100.0)
Monocytes Absolute: 0.7 10*3/uL (ref 0.1–1.0)
Monocytes Relative: 6 %
Neutro Abs: 9.8 10*3/uL — ABNORMAL HIGH (ref 1.7–7.7)
Neutrophils Relative %: 76 %
Platelet Count: 334 10*3/uL (ref 150–400)
RBC: 4.31 MIL/uL (ref 3.87–5.11)
RDW: 13.7 % (ref 11.5–15.5)
WBC Count: 12.8 10*3/uL — ABNORMAL HIGH (ref 4.0–10.5)
nRBC: 0 % (ref 0.0–0.2)

## 2020-05-16 MED ORDER — INFLUENZA VAC A&B SA ADJ QUAD 0.5 ML IM PRSY
PREFILLED_SYRINGE | INTRAMUSCULAR | Status: AC
  Filled 2020-05-16: qty 0.5

## 2020-05-16 MED ORDER — INFLUENZA VAC A&B SA ADJ QUAD 0.5 ML IM PRSY
0.5000 mL | PREFILLED_SYRINGE | Freq: Once | INTRAMUSCULAR | Status: AC
  Administered 2020-05-16: 0.5 mL via INTRAMUSCULAR

## 2020-05-16 NOTE — Progress Notes (Signed)
[place] OFFICE PROGRESS NOTE   Diagnosis: Autoimmune hemolytic anemia  INTERVAL HISTORY:   Michelle Wilcox was last seen for video visit in March.  She had a CBC with Dr. Leonides Schanz in May.  She continues prednisone at a dose of 5 mg daily.  No new complaint.  She has received the COVID-19 booster vaccine.  Objective:  Vital signs in last 24 hours:  Blood pressure (!) 170/81, pulse 72, temperature 97.9 F (36.6 C), temperature source Tympanic, resp. rate 18, height 5\' 4"  (1.626 m), weight 177 lb 9.6 oz (80.6 kg), SpO2 98 %.    Lymphatics: No cervical, supraclavicular, axillary, or inguinal nodes Resp: Lungs clear bilaterally Cardio: Regular rate and rhythm GI: No hepatomegaly Vascular: No leg edema     Lab Results:  Lab Results  Component Value Date   WBC 12.8 (H) 05/16/2020   HGB 13.4 05/16/2020   HCT 41.3 05/16/2020   MCV 95.8 05/16/2020   PLT 334 05/16/2020   NEUTROABS 9.8 (H) 05/16/2020    CMP  Lab Results  Component Value Date   NA 144 06/19/2018   K 3.9 06/19/2018   CL 108 06/19/2018   CO2 29 06/19/2018   GLUCOSE 100 (H) 06/19/2018   BUN 9 06/19/2018   CREATININE 0.61 06/19/2018   CALCIUM 8.6 (L) 06/19/2018   PROT 5.8 (L) 04/07/2018   ALBUMIN 3.8 04/07/2018   AST 19 04/07/2018   ALT 10 04/07/2018   ALKPHOS 61 04/07/2018   BILITOT 0.5 04/07/2018   GFRNONAA >60 06/19/2018   GFRAA >60 06/19/2018     Medications: I have reviewed the patient's current medications.   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, DAT IgG positive  Prednisone started 03/20/2016  Tapered to 15 mg daily on 04/30/2016  Hemoglobin lower, elevated LDH 05/21/2016-prednisone increased to 30 mgdaily  Prednisone taper to 25 mg daily 06/18/2016  Prednisone taper to 20 mg daily 07/16/2016  Prednisone taper to 15 mg daily 08/27/2016  Prednisone taper to 12.5 mg daily 09/25/2016  Hemoglobin lower, prednisone  increased to 20 mgdaily on 10/11/2016  Cycle 1 rituximab 11/19/2016  Cycle 2 rituximab 11/26/2016  Cycle 3 rituximab 12/03/2016  Cycle 4 rituximab 12/28/2016  Prednisone taper to 15 mg daily 12/28/2016  Prednisone taper to 12.5 mg daily 01/28/2017  Prednisone taper to 10 mg daily 02/19/2017  Prednisone taper to 7.5 mg daily 04/01/2017  Prednisone taper to 5 mg daily 05/20/2017  Admission with progressive anemia and evidence of hemolysis 08/03/2017  Prednisone resumed at a dose of 60 mg daily  Prednisone taper to 40 mg 08/12/2017  Prednisone taper to 30 mg daily 08/24/2017  Prednisone taper to 20 mg daily 09/03/2017  Prednisone taper to 15 mg daily 09/20/2017  Prednisone taper to 12.5 mg daily 10/14/2017  Pednisone taper to 10 mg daily 11/05/2017  Progressiveanemia 12/16/2017  Prednisone increased to 20 mg daily 12/16/2017  Prednisone decreased to 15 mg daily 01/06/2018  Prednisone decreased to 12.5 mg daily 02/17/2018  Prednisone taper to 12.5 mg alternating with 10 mg daily 03/17/2018  Splenectomy 06/18/2018  Prednisone decreased to 10 mg daily 07/07/2018  Prednisone decreased to 7.5 mg daily 07/30/2018  Prednisone changed to 7.5 alternating with 5 mg daily 08/18/2018  Prednisone changed to 5 mg daily 09/08/2018  2. History ofExertional dyspnea secondary to #1  3. Mild splenomegaly on CT 03/16/2016  4. History of polymyalgia rheumatica  5. History of giant cell/temporal arteritis-maintained on methotrexate in the remote past, now  on low dose prednisone  6.Admission with leg edema 08/03/2017-diastolic heart failure?, Exacerbated by anemia  7.Right arm superficial phlebitis 03/17/2018, resolved     Disposition: [name] remains in clinical remission from the autoimmune hemolytic anemia.  She continues prednisone at a dose of 5 mg daily for treatment of polymyalgia rheumatica.  She will continue close follow-up with Dr. Leonides Schanz.   She will arrange for a CBC in 6 months.  [name] will return for an office visit in 1 year.  She will seek medical attention for symptoms of an infection.  She received an influenza vaccine today.  She will take the 23 valent pneumococcal vaccine every 5 years.  Betsy Coder, MD  05/16/2020  11:43 AM

## 2020-05-17 ENCOUNTER — Telehealth: Payer: Self-pay | Admitting: Oncology

## 2020-05-17 NOTE — Telephone Encounter (Signed)
Scheduled appointments per 9/27 los. Called patient, no answer. Left message for patient with appointment date and time.

## 2020-05-30 DIAGNOSIS — M9904 Segmental and somatic dysfunction of sacral region: Secondary | ICD-10-CM | POA: Diagnosis not present

## 2020-05-30 DIAGNOSIS — M9903 Segmental and somatic dysfunction of lumbar region: Secondary | ICD-10-CM | POA: Diagnosis not present

## 2020-05-30 DIAGNOSIS — M545 Low back pain, unspecified: Secondary | ICD-10-CM | POA: Diagnosis not present

## 2020-05-30 DIAGNOSIS — M9905 Segmental and somatic dysfunction of pelvic region: Secondary | ICD-10-CM | POA: Diagnosis not present

## 2020-06-06 ENCOUNTER — Other Ambulatory Visit: Payer: Medicare Other

## 2020-06-12 DIAGNOSIS — J40 Bronchitis, not specified as acute or chronic: Secondary | ICD-10-CM | POA: Diagnosis not present

## 2020-06-12 DIAGNOSIS — Z03818 Encounter for observation for suspected exposure to other biological agents ruled out: Secondary | ICD-10-CM | POA: Diagnosis not present

## 2020-06-12 DIAGNOSIS — R059 Cough, unspecified: Secondary | ICD-10-CM | POA: Diagnosis not present

## 2020-06-12 DIAGNOSIS — B974 Respiratory syncytial virus as the cause of diseases classified elsewhere: Secondary | ICD-10-CM | POA: Diagnosis not present

## 2020-06-12 DIAGNOSIS — Z8709 Personal history of other diseases of the respiratory system: Secondary | ICD-10-CM | POA: Diagnosis not present

## 2020-07-07 ENCOUNTER — Other Ambulatory Visit: Payer: Self-pay | Admitting: Oncology

## 2020-07-11 DIAGNOSIS — E673 Hypervitaminosis D: Secondary | ICD-10-CM | POA: Diagnosis not present

## 2020-07-11 DIAGNOSIS — E559 Vitamin D deficiency, unspecified: Secondary | ICD-10-CM | POA: Diagnosis not present

## 2020-07-25 ENCOUNTER — Other Ambulatory Visit: Payer: Self-pay

## 2020-07-25 ENCOUNTER — Ambulatory Visit
Admission: RE | Admit: 2020-07-25 | Discharge: 2020-07-25 | Disposition: A | Discharge status: Home / Self Care | Payer: Medicare Other | Source: Ambulatory Visit | Attending: Family Medicine | Admitting: Family Medicine

## 2020-07-25 DIAGNOSIS — M85852 Other specified disorders of bone density and structure, left thigh: Secondary | ICD-10-CM

## 2020-07-25 DIAGNOSIS — Z780 Asymptomatic menopausal state: Secondary | ICD-10-CM | POA: Diagnosis not present

## 2020-07-25 DIAGNOSIS — Z7952 Long term (current) use of systemic steroids: Secondary | ICD-10-CM

## 2020-07-25 DIAGNOSIS — M85851 Other specified disorders of bone density and structure, right thigh: Secondary | ICD-10-CM | POA: Diagnosis not present

## 2020-08-08 DIAGNOSIS — M85852 Other specified disorders of bone density and structure, left thigh: Secondary | ICD-10-CM | POA: Diagnosis not present

## 2020-08-08 DIAGNOSIS — I5032 Chronic diastolic (congestive) heart failure: Secondary | ICD-10-CM | POA: Diagnosis not present

## 2020-08-08 DIAGNOSIS — M353 Polymyalgia rheumatica: Secondary | ICD-10-CM | POA: Diagnosis not present

## 2020-08-08 DIAGNOSIS — E785 Hyperlipidemia, unspecified: Secondary | ICD-10-CM | POA: Diagnosis not present

## 2020-08-08 DIAGNOSIS — I10 Essential (primary) hypertension: Secondary | ICD-10-CM | POA: Diagnosis not present

## 2020-08-08 DIAGNOSIS — F418 Other specified anxiety disorders: Secondary | ICD-10-CM | POA: Diagnosis not present

## 2020-08-08 DIAGNOSIS — E673 Hypervitaminosis D: Secondary | ICD-10-CM | POA: Diagnosis not present

## 2020-08-08 DIAGNOSIS — D5911 Warm autoimmune hemolytic anemia: Secondary | ICD-10-CM | POA: Diagnosis not present

## 2020-09-26 ENCOUNTER — Ambulatory Visit (INDEPENDENT_AMBULATORY_CARE_PROVIDER_SITE_OTHER): Payer: Medicare Other | Admitting: Cardiology

## 2020-09-26 ENCOUNTER — Encounter: Payer: Self-pay | Admitting: Cardiology

## 2020-09-26 ENCOUNTER — Other Ambulatory Visit: Payer: Self-pay

## 2020-09-26 VITALS — BP 148/88 | HR 64 | Ht 64.0 in | Wt 173.0 lb

## 2020-09-26 DIAGNOSIS — I10 Essential (primary) hypertension: Secondary | ICD-10-CM | POA: Diagnosis not present

## 2020-09-26 DIAGNOSIS — R0600 Dyspnea, unspecified: Secondary | ICD-10-CM | POA: Diagnosis not present

## 2020-09-26 DIAGNOSIS — E782 Mixed hyperlipidemia: Secondary | ICD-10-CM | POA: Diagnosis not present

## 2020-09-26 DIAGNOSIS — I5189 Other ill-defined heart diseases: Secondary | ICD-10-CM | POA: Diagnosis not present

## 2020-09-26 DIAGNOSIS — R0609 Other forms of dyspnea: Secondary | ICD-10-CM

## 2020-09-26 NOTE — Patient Instructions (Addendum)
Medication Instructions:  Your physician recommends that you continue on your current medications as directed. Please refer to the Current Medication list given to you today.  *If you need a refill on your cardiac medications before your next appointment, please call your pharmacy*  Follow-Up: At [facility], you and your health needs are our priority.  As part of our continuing mission to provide you with exceptional heart care, we have created designated Provider Care Teams.  These Care Teams include your primary Cardiologist (physician) and Advanced Practice Providers (APPs -  Physician Assistants and Nurse Practitioners) who all work together to provide you with the care you need, when you need it.  We recommend signing up for the patient portal called "MyChart".  Sign up information is provided on this After Visit Summary.  MyChart is used to connect with patients for Virtual Visits (Telemedicine).  Patients are able to view lab/test results, encounter notes, upcoming appointments, etc.  Non-urgent messages can be sent to your provider as well.   To learn more about what you can do with MyChart, go to [email or web address].    Your next appointment:   12 month(s)  The format for your next appointment:   In Person  Provider:   David Harding, MD   

## 2020-09-26 NOTE — Progress Notes (Signed)
Primary Care Provider: Gweneth Dimitri, MD Cardiologist: Bryan Lemma, MD Electrophysiologist: None  Clinic Note: Chief Complaint  Patient presents with  . Follow-up    21-month  . Hypertension    Usually better controlled than it is today.  Has been under a lot of stress.   ===================================  ASSESSMENT/PLAN   Problem List Items Addressed This Visit    Essential hypertension (Chronic)    Her blood pressure is usually better controlled today.  I asked that she monitor closely and make sure that is not drifting up.  She is on moderate dose of metoprolol tapered for symptoms.  Also on low-dose standing furosemide.  I would probably have to add a different medication if additional blood pressure control needed.  As such, would like to hold off for now.      Hyperlipidemia (Chronic)    Very well controlled back in 2017, now LDL is up.  She is no longer on therapy.  Hopefully with diet exercise she can improve her lipids to bring LDL 100.  As such, we will try to avoid therapy.  Labs are being checked by PCP-defer initiation of statin to PCP, since I am not seeing her frequently..      Relevant Orders   EKG 12-Lead (Completed)   Diastolic dysfunction without heart failure - Primary (Chronic)    No real heart failure symptoms now. Dyslipidemia controlled but furosemide.  EF is normal echo.  Probably does have some diastolic dysfunction from hypertensive heart disease.  She is on stable dose of Lasix for mild edema and beta-blocker for rate control of her palpitations. If additional blood pressure requirements needed, would probably consider ACE-I/ARB as first choice..      Relevant Orders   EKG 12-Lead (Completed)   DOE (dyspnea on exertion)    Epiglottis it was probably related to her AHA.  Seems improved.  There is probably diastolic dysfunction component.  Continue to monitor blood pressures.        ===================================  HPI:    Michelle Wilcox is a 75 y.o. female with a PMH notable for AUTOIMMUNE HEMOLYTIC ANEMIA as well as GIANT CELL TEMPORAL ARTERITIS and PMR along with HTN and an episode of Acute Diastolic Heart Failure in the Setting of Pneumonia who presents today for reestablishing care at the request of Dr. Corliss Blacker.  She was first evaluated during hospitalization December 2018 which presented with lower extremity edema and dyspnea.  Diuresis with Lasix and treated for influenza pneumonia.  She was given a diagnosis of congestive of heart failure.  Michelle Wilcox was last seen by me on March 05, 2018 for preop clearance pending splenectomy.  Lexiscan Myoview was nonischemic.   She was seen Joni Reining, DNP on March 26, 2018 with complaints of GI symptoms of explosive diarrhea and abdominal pain since discharge from the hospital-initial plans for slide to me in July were delayed.Marland Kitchen  PCP felt this was a GI bug.  Denies any fluid retention.    She had a splenectomy on June 18, 2018.  Recent Hospitalizations: None  Reviewed  CV studies:    The following studies were reviewed today: (if available, images/films reviewed: From Epic Chart or Care Everywhere) . None:  Interval History:   Michelle Wilcox [place] pressure today overall doing pretty.  She says that her AHA a lot better since her splenectomy.  1 year out she is doing well.  Her counts are doing well.  Her blood counts been stable with  her energy level is notably improved.  She is now really only bothered by right knee pain-mostly already been doing stairs or carrying things.  She is trying to avoid surgery until her husband's stabilizes.  She has been a little stressed out this week had a bad week with lots of things going on-but not with her.  Her husband had a new diagnosis of cancer this fall which stage IV stomach cancer.  He has been in and out of treatment and hospitalization.  He has had problems with hyponatremia.  Is no other social  issues revolving around this.  She says her BP may be a little bit high today and yesterday, but on routine checks at home has been in the 120-130/70-80 mmHg range.  He is only taking 20 mg of furosemide daily.  Has not used a full 40 mg in a long time.  She also says that since we adjusted her metoprolol dosing to 25 mg in the evening and 50 mg in the morning, her palpitations been well controlled.  CV Review of Symptoms (Summary): no chest pain or dyspnea on exertion positive for - A little deconditioned, in her knees hurting her that she gets a little short of breath when walking-nothing dramatic. negative for - chest pain, edema, irregular heartbeat, orthopnea, palpitations, paroxysmal nocturnal dyspnea, rapid heart rate, shortness of breath or Expect near syncope or TIA/amaurosis fugax, claudication  The patient does not have symptoms concerning for COVID-19 infection (fever, chills, cough, or new shortness of breath).   REVIEWED OF SYSTEMS   Review of Systems  Constitutional: Negative for malaise/fatigue (Energy level notably improved) and weight loss.  HENT: Negative for congestion and nosebleeds.   Respiratory: Negative for cough, shortness of breath and wheezing.   Cardiovascular: Negative for leg swelling.  Gastrointestinal: Negative for blood in stool, diarrhea and melena.  Musculoskeletal: Positive for joint pain (Right knee arthritis pain-limits walking). Negative for falls and myalgias.  Neurological: Negative for dizziness, focal weakness, weakness and headaches.  Psychiatric/Behavioral: Negative for memory loss. The patient is not nervous/anxious and does not have insomnia.        Lots of social stress   I have reviewed and (if needed) personally updated the patient's problem list, medications, allergies, past medical and surgical history, social and family history.   PAST MEDICAL HISTORY   Past Medical History:  Diagnosis Date  . Anxiety    hx of  . Asthma    at  times  . CHF (congestive heart failure) ([place])   . Depression    hx of  . Dyspnea   . Giant cell arteritis ([place])   . Gouty arthropathy   . Hemolytic anemia ([place])   . Hyperlipidemia   . Hypertension   . Irregular heart beat    extra beat  . Migraines   . Polymyalgia ([place])   . Polymyalgia rheumatica ([place])   . Temporal arteritis ([place])     PAST SURGICAL HISTORY   Past Surgical History:  Procedure Laterality Date  . BARTHOLIN GLAND CYST EXCISION     non ca  . BREAST BIOPSY Right   . EYE MUSCLE SURGERY     as a child 75 yrs old  . KNEE SURGERY  2004   rt knee  . LAPAROSCOPIC SPLENECTOMY N/A 06/18/2018   Procedure: LAPAROSCOPIC SPLENECTOMY ERAS PATHWAY;  Surgeon: Excell Seltzer, MD;  Location: [facility];  Service: General;  Laterality: N/A;  . toncil    . TONSILLECTOMY  as a child    Transthoracic Echo December 2018: EF 60 to 65% with normal wall motion.  GR 1 DD.  Mild AI.  Mild pulmonary hypertension.  Lower extremity venous Dopplers in December 2018: No DVTs.  Myoview 03/11/2018: Low risk.  EF 69%.  No ischemia or infarction.   Immunization History  Administered Date(s) Administered  . Fluad Quad(high Dose 65+) 05/16/2020  . Influenza, High Dose Seasonal PF 04/28/2018  . Influenza,inj,Quad PF,6+ Mos 06/04/2016  . PFIZER(Purple Top)SARS-COV-2 Vaccination 09/13/2019, 10/05/2019, 04/19/2020  . Tdap 10/23/2019    MEDICATIONS/ALLERGIES   Current Meds  Medication Sig  . acetaminophen (TYLENOL) 650 MG CR tablet Take 1,300 mg by mouth 2 (two) times daily.  . folic acid (FOLVITE) 1 MG tablet TAKE 1 TABLET(1 MG) BY MOUTH DAILY  . furosemide (LASIX) 40 MG tablet Take 20-40 mg by mouth daily.  Marland Kitchen lactase (LACTAID) 3000 units tablet Take 6,000-9,000 Units by mouth as needed (consuming dairy products).  . loperamide (IMODIUM A-D) 2 MG tablet Take 2 mg by mouth as needed for diarrhea or loose stools (takes 2 if needed).  . loratadine (CLARITIN) 10 MG tablet Take 10 mg by  mouth daily.  . metoprolol tartrate (LOPRESSOR) 50 MG tablet Take 25-50 mg by mouth See admin instructions. Take 1 tablet (50 mg total) by mouth every morning AND take 0.5 tablet (25 mg total) by mouth every evening.  . Multiple Vitamin (MULTIVITAMIN) tablet Take 1 tablet by mouth every evening.   Marland Kitchen omeprazole (PRILOSEC OTC) 20 MG tablet Take 20 mg by mouth every morning.   . potassium chloride SA (K-DUR,KLOR-CON) 20 MEQ tablet Take 10-20 mEq by mouth daily.  . predniSONE (DELTASONE) 5 MG tablet TAKE 1 TABLET(5 MG) BY MOUTH DAILY WITH BREAKFAST  . Probiotic Product (ALIGN) 4 MG CAPS Take 4 mg by mouth every morning.  . simethicone (MYLICON) 80 MG chewable tablet Chew 80 mg by mouth every 6 (six) hours as needed for flatulence.   . VENTOLIN HFA 108 (90 Base) MCG/ACT inhaler Take 1-2 puffs by mouth every 4 (four) hours as needed for shortness of breath.  . Vitamin D3 (VITAMIN D) 25 MCG tablet Take 1,000 Units by mouth every other day.    Allergies  Allergen Reactions  . Penicillins Anaphylaxis    Has patient had a PCN reaction causing immediate rash, facial/tongue/throat swelling, SOB or lightheadedness with hypotension: yes Has patient had a PCN reaction causing severe rash involving mucus membranes or skin necrosis: no Has patient had a PCN reaction that required hospitalization: yes Has patient had a PCN reaction occurring within the last 10 years: no If all of the above answers are "NO", then may proceed with Cephalosporin use.   Elmer Bales (Diagnostic) Hives  . Ceftin [Cefuroxime Axetil] Hives    headaches  . Ciprofloxacin Hives    headaches  . Darvocet [Propoxyphene N-Acetaminophen]     headaches  . Lactose Intolerance (Gi) Diarrhea  . Levaquin [Levofloxacin In D5w] Hives  . Sulfa Antibiotics Hives  . Zyrtec [Cetirizine Hcl]     headache  . Other     Powder in gloves - Rash  MSG - Hives  Swiss Cheese - Hives       SOCIAL HISTORY/FAMILY HISTORY   Reviewed in Epic:   Pertinent findings:  Social History   Tobacco Use  . Smoking status: Never Smoker  . Smokeless tobacco: Never Used  Vaping Use  . Vaping Use: Never used  Substance Use Topics  . Alcohol  use: Yes    Alcohol/week: 1.0 standard drink    Types: 1 Glasses of wine per week    Comment: occasional  . Drug use: Never   Social History   Social History Narrative  . Not on file    OBJCTIVE -PE, EKG, labs   Wt Readings from Last 3 Encounters:  09/26/20 173 lb (78.5 kg)  05/16/20 177 lb 9.6 oz (80.6 kg)  09/29/18 177 lb 6.4 oz (80.5 kg)    Physical Exam: BP (!) 148/88 (BP Location: Left Arm, Patient Position: Sitting)   Pulse 64   Ht 5\' 4"  (1.626 m)   Wt 173 lb (78.5 kg)   SpO2 94%   BMI 29.70 kg/m  Physical Exam Vitals reviewed.  Constitutional:      General: She is not in acute distress.    Appearance: Normal appearance. She is obese. She is not toxic-appearing.     Comments: Well-groomed.  Healthy-appearing.  HENT:     Head: Normocephalic and atraumatic.  Neck:     Vascular: No carotid bruit, hepatojugular reflux or JVD.  Cardiovascular:     Rate and Rhythm: Normal rate and regular rhythm.  No extrasystoles are present.    Chest Wall: PMI is not displaced.     Pulses: Normal pulses and intact distal pulses.     Heart sounds: Normal heart sounds. No murmur heard. No friction rub. No gallop.   Pulmonary:     Effort: Pulmonary effort is normal. No respiratory distress.     Breath sounds: Normal breath sounds.  Musculoskeletal:        General: No swelling. Normal range of motion.     Cervical back: Normal range of motion and neck supple.  Skin:    General: Skin is warm and dry.  Neurological:     General: No focal deficit present.     Mental Status: She is alert and oriented to person, place, and time. Mental status is at baseline.  Psychiatric:        Mood and Affect: Mood normal.        Behavior: Behavior normal.        Thought Content: Thought content normal.         Judgment: Judgment normal.      Adult ECG Report  Rate: 64 ;  Rhythm: normal sinus rhythm and Normal axis, intervals and durations;   Narrative Interpretation: Normal EKG  Recent Labs:   01/08/2020: TC 209, TG 130, HDL 70, LDL 114. Cr 0.73 Lab Results  Component Value Date   CHOL 145 03/13/2016   HDL 55 03/13/2016   LDLCALC 69 03/13/2016   TRIG 103 03/13/2016   CHOLHDL 2.6 03/13/2016   Lab Results  Component Value Date   CREATININE 0.61 06/19/2018   BUN 9 06/19/2018   NA 144 06/19/2018   K 3.9 06/19/2018   CL 108 06/19/2018   CO2 29 06/19/2018   CBC Latest Ref Rng & Units 05/16/2020 09/29/2018 09/08/2018  WBC 4.0 - 10.5 K/uL 12.8(H) 7.7 6.1  Hemoglobin 12.0 - 15.0 g/dL 13.4 13.4 12.9  Hematocrit 36.0 - 46.0 % 41.3 42.3 40.5  Platelets 150 - 400 K/uL 334 422(H) 446(H)    Lab Results  Component Value Date   TSH 0.661 08/04/2017    ==================================================  COVID-19 Education: The signs and symptoms of COVID-19 were discussed with the patient and how to seek care for testing (follow up with PCP or arrange E-visit).   The importance of social distancing and  COVID-19 vaccination was discussed today. The patient is practicing social distancing & Masking.   I spent a total of 25 minutes with the patient spent in direct patient consultation.  Additional time spent with chart review  / charting (studies, outside notes, etc): 8 min Total Time: 33 min   Current medicines are reviewed at length with the patient today.  (+/- concerns) N/A  This visit occurred during the SARS-CoV-2 public health emergency.  Safety protocols were in place, including screening questions prior to the visit, additional usage of staff PPE, and extensive cleaning of exam room while observing appropriate contact time as indicated for disinfecting solutions.  Notice: This dictation was prepared with Dragon dictation along with smaller phrase technology. Any  transcriptional errors that result from this process are unintentional and may not be corrected upon review.  Patient Instructions / Medication Changes & Studies & Tests Ordered   Patient Instructions  Medication Instructions:  Your physician recommends that you continue on your current medications as directed. Please refer to the Current Medication list given to you today.  *If you need a refill on your cardiac medications before your next appointment, please call your pharmacy*   Follow-Up: At [facility], you and your health needs are our priority.  As part of our continuing mission to provide you with exceptional heart care, we have created designated Provider Care Teams.  These Care Teams include your primary Cardiologist (physician) and Advanced Practice Providers (APPs -  Physician Assistants and Nurse Practitioners) who all work together to provide you with the care you need, when you need it.  We recommend signing up for the patient portal called "MyChart".  Sign up information is provided on this After Visit Summary.  MyChart is used to connect with patients for Virtual Visits (Telemedicine).  Patients are able to view lab/test results, encounter notes, upcoming appointments, etc.  Non-urgent messages can be sent to your provider as well.   To learn more about what you can do with MyChart, go to [email or web address].    Your next appointment:   12 month(s)  The format for your next appointment:   In Person  Provider:   Glenetta Hew, MD     Studies Ordered:   Orders Placed This Encounter  Procedures  . EKG 12-Lead     Glenetta Hew, M.D., M.S. Interventional Cardiologist   Pager # [phone] Phone # [phone] [address] [place], [place] [address]   Thank you for choosing Heart[facility] at [facility]!!

## 2020-10-10 ENCOUNTER — Encounter: Payer: Self-pay | Admitting: Cardiology

## 2020-10-10 DIAGNOSIS — I10 Essential (primary) hypertension: Secondary | ICD-10-CM | POA: Diagnosis not present

## 2020-10-10 DIAGNOSIS — I5032 Chronic diastolic (congestive) heart failure: Secondary | ICD-10-CM | POA: Diagnosis not present

## 2020-10-10 DIAGNOSIS — J45909 Unspecified asthma, uncomplicated: Secondary | ICD-10-CM | POA: Diagnosis not present

## 2020-10-10 DIAGNOSIS — E785 Hyperlipidemia, unspecified: Secondary | ICD-10-CM | POA: Diagnosis not present

## 2020-10-10 NOTE — Assessment & Plan Note (Signed)
Her blood pressure is usually better controlled today.  I asked that she monitor closely and make sure that is not drifting up.  She is on moderate dose of metoprolol tapered for symptoms.  Also on low-dose standing furosemide.  I would probably have to add a different medication if additional blood pressure control needed.  As such, would like to hold off for now.

## 2020-10-10 NOTE — Assessment & Plan Note (Signed)
Epiglottis it was probably related to her AHA.  Seems improved.  There is probably diastolic dysfunction component.  Continue to monitor blood pressures.

## 2020-10-10 NOTE — Assessment & Plan Note (Signed)
Very well controlled back in 2017, now LDL is up.  She is no longer on therapy.  Hopefully with diet exercise she can improve her lipids to bring LDL 100.  As such, we will try to avoid therapy.  Labs are being checked by PCP-defer initiation of statin to PCP, since I am not seeing her frequently.Marland Kitchen

## 2020-10-10 NOTE — Assessment & Plan Note (Signed)
No real heart failure symptoms now. Dyslipidemia controlled but furosemide.  EF is normal echo.  Probably does have some diastolic dysfunction from hypertensive heart disease.  She is on stable dose of Lasix for mild edema and beta-blocker for rate control of her palpitations. If additional blood pressure requirements needed, would probably consider ACE-I/ARB as first choice.Marland Kitchen

## 2020-11-08 DIAGNOSIS — J45909 Unspecified asthma, uncomplicated: Secondary | ICD-10-CM | POA: Diagnosis not present

## 2020-11-08 DIAGNOSIS — I10 Essential (primary) hypertension: Secondary | ICD-10-CM | POA: Diagnosis not present

## 2020-11-08 DIAGNOSIS — I5032 Chronic diastolic (congestive) heart failure: Secondary | ICD-10-CM | POA: Diagnosis not present

## 2020-11-08 DIAGNOSIS — E785 Hyperlipidemia, unspecified: Secondary | ICD-10-CM | POA: Diagnosis not present

## 2020-11-14 DIAGNOSIS — Z23 Encounter for immunization: Secondary | ICD-10-CM | POA: Diagnosis not present

## 2020-11-14 DIAGNOSIS — D594 Other nonautoimmune hemolytic anemias: Secondary | ICD-10-CM | POA: Diagnosis not present

## 2020-11-14 DIAGNOSIS — Z79899 Other long term (current) drug therapy: Secondary | ICD-10-CM | POA: Diagnosis not present

## 2020-11-14 DIAGNOSIS — M353 Polymyalgia rheumatica: Secondary | ICD-10-CM | POA: Diagnosis not present

## 2020-11-14 DIAGNOSIS — M150 Primary generalized (osteo)arthritis: Secondary | ICD-10-CM | POA: Diagnosis not present

## 2020-11-14 DIAGNOSIS — M2550 Pain in unspecified joint: Secondary | ICD-10-CM | POA: Diagnosis not present

## 2020-11-14 DIAGNOSIS — E663 Overweight: Secondary | ICD-10-CM | POA: Diagnosis not present

## 2020-11-14 DIAGNOSIS — Z6829 Body mass index (BMI) 29.0-29.9, adult: Secondary | ICD-10-CM | POA: Diagnosis not present

## 2020-11-14 DIAGNOSIS — M315 Giant cell arteritis with polymyalgia rheumatica: Secondary | ICD-10-CM | POA: Diagnosis not present

## 2021-02-08 DIAGNOSIS — J45909 Unspecified asthma, uncomplicated: Secondary | ICD-10-CM | POA: Diagnosis not present

## 2021-02-08 DIAGNOSIS — I5032 Chronic diastolic (congestive) heart failure: Secondary | ICD-10-CM | POA: Diagnosis not present

## 2021-02-08 DIAGNOSIS — E785 Hyperlipidemia, unspecified: Secondary | ICD-10-CM | POA: Diagnosis not present

## 2021-02-08 DIAGNOSIS — I10 Essential (primary) hypertension: Secondary | ICD-10-CM | POA: Diagnosis not present

## 2021-02-27 DIAGNOSIS — D1801 Hemangioma of skin and subcutaneous tissue: Secondary | ICD-10-CM | POA: Diagnosis not present

## 2021-02-27 DIAGNOSIS — D225 Melanocytic nevi of trunk: Secondary | ICD-10-CM | POA: Diagnosis not present

## 2021-02-27 DIAGNOSIS — L821 Other seborrheic keratosis: Secondary | ICD-10-CM | POA: Diagnosis not present

## 2021-02-27 DIAGNOSIS — L814 Other melanin hyperpigmentation: Secondary | ICD-10-CM | POA: Diagnosis not present

## 2021-02-27 DIAGNOSIS — L304 Erythema intertrigo: Secondary | ICD-10-CM | POA: Diagnosis not present

## 2021-02-28 DIAGNOSIS — E785 Hyperlipidemia, unspecified: Secondary | ICD-10-CM | POA: Diagnosis not present

## 2021-02-28 DIAGNOSIS — I10 Essential (primary) hypertension: Secondary | ICD-10-CM | POA: Diagnosis not present

## 2021-02-28 DIAGNOSIS — J45909 Unspecified asthma, uncomplicated: Secondary | ICD-10-CM | POA: Diagnosis not present

## 2021-02-28 DIAGNOSIS — I5032 Chronic diastolic (congestive) heart failure: Secondary | ICD-10-CM | POA: Diagnosis not present

## 2021-03-13 ENCOUNTER — Other Ambulatory Visit: Payer: Self-pay | Admitting: Family Medicine

## 2021-03-13 DIAGNOSIS — Z1231 Encounter for screening mammogram for malignant neoplasm of breast: Secondary | ICD-10-CM

## 2021-03-22 ENCOUNTER — Other Ambulatory Visit: Payer: Self-pay | Admitting: Oncology

## 2021-03-22 DIAGNOSIS — D591 Autoimmune hemolytic anemia, unspecified: Secondary | ICD-10-CM

## 2021-03-27 ENCOUNTER — Ambulatory Visit
Admission: RE | Admit: 2021-03-27 | Discharge: 2021-03-27 | Disposition: A | Discharge status: Home / Self Care | Payer: Medicare Other | Source: Ambulatory Visit | Attending: Family Medicine | Admitting: Family Medicine

## 2021-03-27 ENCOUNTER — Other Ambulatory Visit: Payer: Self-pay

## 2021-03-27 DIAGNOSIS — Z1231 Encounter for screening mammogram for malignant neoplasm of breast: Secondary | ICD-10-CM

## 2021-04-03 DIAGNOSIS — J45909 Unspecified asthma, uncomplicated: Secondary | ICD-10-CM | POA: Diagnosis not present

## 2021-04-03 DIAGNOSIS — I10 Essential (primary) hypertension: Secondary | ICD-10-CM | POA: Diagnosis not present

## 2021-04-03 DIAGNOSIS — M85852 Other specified disorders of bone density and structure, left thigh: Secondary | ICD-10-CM | POA: Diagnosis not present

## 2021-04-03 DIAGNOSIS — E673 Hypervitaminosis D: Secondary | ICD-10-CM | POA: Diagnosis not present

## 2021-04-03 DIAGNOSIS — E559 Vitamin D deficiency, unspecified: Secondary | ICD-10-CM | POA: Diagnosis not present

## 2021-04-03 DIAGNOSIS — F418 Other specified anxiety disorders: Secondary | ICD-10-CM | POA: Diagnosis not present

## 2021-04-03 DIAGNOSIS — M353 Polymyalgia rheumatica: Secondary | ICD-10-CM | POA: Diagnosis not present

## 2021-04-03 DIAGNOSIS — J301 Allergic rhinitis due to pollen: Secondary | ICD-10-CM | POA: Diagnosis not present

## 2021-04-03 DIAGNOSIS — E785 Hyperlipidemia, unspecified: Secondary | ICD-10-CM | POA: Diagnosis not present

## 2021-04-03 DIAGNOSIS — D5911 Warm autoimmune hemolytic anemia: Secondary | ICD-10-CM | POA: Diagnosis not present

## 2021-04-03 DIAGNOSIS — I5032 Chronic diastolic (congestive) heart failure: Secondary | ICD-10-CM | POA: Diagnosis not present

## 2021-05-01 DIAGNOSIS — Z1389 Encounter for screening for other disorder: Secondary | ICD-10-CM | POA: Diagnosis not present

## 2021-05-01 DIAGNOSIS — Z0000 Encounter for general adult medical examination without abnormal findings: Secondary | ICD-10-CM | POA: Diagnosis not present

## 2021-05-07 DIAGNOSIS — Z23 Encounter for immunization: Secondary | ICD-10-CM | POA: Diagnosis not present

## 2021-05-16 ENCOUNTER — Other Ambulatory Visit: Payer: Self-pay

## 2021-05-16 ENCOUNTER — Other Ambulatory Visit: Payer: Medicare Other

## 2021-05-16 ENCOUNTER — Inpatient Hospital Stay: Payer: Medicare Other

## 2021-05-16 ENCOUNTER — Inpatient Hospital Stay: Payer: Medicare Other | Attending: Oncology | Admitting: Oncology

## 2021-05-16 VITALS — BP 153/78 | HR 66 | Temp 98.7°F | Resp 18 | Ht 64.0 in | Wt 172.8 lb

## 2021-05-16 DIAGNOSIS — D591 Autoimmune hemolytic anemia, unspecified: Secondary | ICD-10-CM

## 2021-05-16 DIAGNOSIS — M353 Polymyalgia rheumatica: Secondary | ICD-10-CM | POA: Insufficient documentation

## 2021-05-16 DIAGNOSIS — D5911 Warm autoimmune hemolytic anemia: Secondary | ICD-10-CM | POA: Diagnosis not present

## 2021-05-16 LAB — CBC WITH DIFFERENTIAL (CANCER CENTER ONLY)
Abs Immature Granulocytes: 0.02 K/uL (ref 0.00–0.07)
Basophils Absolute: 0.1 K/uL (ref 0.0–0.1)
Basophils Relative: 1 %
Eosinophils Absolute: 0.2 K/uL (ref 0.0–0.5)
Eosinophils Relative: 2 %
HCT: 42.5 % (ref 36.0–46.0)
Hemoglobin: 13.8 g/dL (ref 12.0–15.0)
Immature Granulocytes: 0 %
Lymphocytes Relative: 14 %
Lymphs Abs: 1.3 K/uL (ref 0.7–4.0)
MCH: 31.7 pg (ref 26.0–34.0)
MCHC: 32.5 g/dL (ref 30.0–36.0)
MCV: 97.7 fL (ref 80.0–100.0)
Monocytes Absolute: 0.4 K/uL (ref 0.1–1.0)
Monocytes Relative: 4 %
Neutro Abs: 7.4 K/uL (ref 1.7–7.7)
Neutrophils Relative %: 79 %
Platelet Count: 371 K/uL (ref 150–400)
RBC: 4.35 MIL/uL (ref 3.87–5.11)
RDW: 13.9 % (ref 11.5–15.5)
WBC Count: 9.4 K/uL (ref 4.0–10.5)
nRBC: 0 % (ref 0.0–0.2)

## 2021-05-16 NOTE — Progress Notes (Signed)
[facility] OFFICE PROGRESS NOTE   Diagnosis: Autoimmune hemolytic anemia  INTERVAL HISTORY:   [name] returns as scheduled.  She feels well.  She is up-to-date on the influenza and COVID-19 vaccines.  She continues prednisone for treatment of polymyalgia rheumatica.  Objective:  Vital signs in last 24 hours:  Blood pressure (!) 153/78, pulse 66, temperature 98.7 F (37.1 C), temperature source Oral, resp. rate 18, height 5\' 4"  (1.626 m), weight 172 lb 12.8 oz (78.4 kg), SpO2 98 %.   Lymphatics: No cervical, supraclavicular, or axillary nodes Resp: Lungs clear bilaterally Cardio: Regular rate and rhythm GI: No hepatomegaly Vascular: No leg edema   Lab Results:  Lab Results  Component Value Date   WBC 9.4 05/16/2021   HGB 13.8 05/16/2021   HCT 42.5 05/16/2021   MCV 97.7 05/16/2021   PLT 371 05/16/2021   NEUTROABS 7.4 05/16/2021    CMP  Lab Results  Component Value Date   NA 144 06/19/2018   K 3.9 06/19/2018   CL 108 06/19/2018   CO2 29 06/19/2018   GLUCOSE 100 (H) 06/19/2018   BUN 9 06/19/2018   CREATININE 0.61 06/19/2018   CALCIUM 8.6 (L) 06/19/2018   PROT 5.8 (L) 04/07/2018   ALBUMIN 3.8 04/07/2018   AST 19 04/07/2018   ALT 10 04/07/2018   ALKPHOS 61 04/07/2018   BILITOT 0.5 04/07/2018   GFRNONAA >60 06/19/2018   GFRAA >60 06/19/2018     Medications: I have reviewed the patient's current medications.   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, DAT IgG positive Prednisone started 03/20/2016 Tapered to 15 mg daily on 04/30/2016 Hemoglobin lower, elevated LDH 05/21/2016-prednisone increased to 30 mg daily Prednisone taper to 25 mg daily 06/18/2016 Prednisone taper to 20 mg daily 07/16/2016 Prednisone taper to 15 mg daily 08/27/2016 Prednisone taper to 12.5 mg daily 09/25/2016 Hemoglobin lower, prednisone increased to 20 mg daily on 10/11/2016 Cycle 1 rituximab 11/19/2016 Cycle 2  rituximab 11/26/2016 Cycle 3 rituximab 12/03/2016 Cycle 4 rituximab 12/28/2016 Prednisone taper to 15 mg daily 12/28/2016 Prednisone taper to 12.5 mg daily 01/28/2017 Prednisone taper to 10 mg daily 02/19/2017 Prednisone taper to 7.5 mg daily 04/01/2017 Prednisone taper to 5 mg daily 05/20/2017 Admission with progressive anemia and evidence of hemolysis 08/03/2017 Prednisone resumed at a dose of 60 mg daily Prednisone taper to 40 mg 08/12/2017 Prednisone taper to 30 mg daily 08/24/2017 Prednisone taper to 20 mg daily 09/03/2017 Prednisone taper to 15 mg daily 09/20/2017 Prednisone taper to 12.5 mg daily 10/14/2017 Pednisone taper to 10 mg daily 11/05/2017 Progressive anemia 12/16/2017 Prednisone increased to 20 mg daily 12/16/2017 Prednisone decreased to 15 mg daily 01/06/2018 Prednisone decreased to 12.5 mg daily 02/17/2018  Prednisone taper to 12.5 mg alternating with 10 mg daily 03/17/2018 Splenectomy 06/18/2018 Prednisone decreased to 10 mg daily 07/07/2018 Prednisone decreased to 7.5 mg daily 07/30/2018 Prednisone changed to 7.5 alternating with 5 mg daily 08/18/2018 Prednisone changed to 5 mg daily 09/08/2018   2.  History of Exertional dyspnea secondary to #1   3.   Mild splenomegaly on CT 03/16/2016   4.   History of polymyalgia rheumatica   5.   History of giant cell/temporal arteritis-maintained on methotrexate in the remote past, now on low dose prednisone   6.   Admission with leg edema 19/37/9024- diastolic heart failure?,  Exacerbated by anemia   7.   Right arm superficial phlebitis 03/17/2018, resolved     Disposition: Ms. [name] remains  in remission from the hemolytic anemia.  She will call for symptoms of anemia.  She will return for an office visit and CBC in 1 year.  She will asked Dr. Leonides Schanz to check the CBC between office visits here.  She will check with Dr. Leonides Schanz to be sure she is up-to-date on the pneumococcal vaccines.  Betsy Coder, MD  05/16/2021  12:42  PM

## 2021-06-08 DIAGNOSIS — I5032 Chronic diastolic (congestive) heart failure: Secondary | ICD-10-CM | POA: Diagnosis not present

## 2021-06-08 DIAGNOSIS — I10 Essential (primary) hypertension: Secondary | ICD-10-CM | POA: Diagnosis not present

## 2021-06-08 DIAGNOSIS — J45909 Unspecified asthma, uncomplicated: Secondary | ICD-10-CM | POA: Diagnosis not present

## 2021-06-08 DIAGNOSIS — E785 Hyperlipidemia, unspecified: Secondary | ICD-10-CM | POA: Diagnosis not present

## 2021-07-07 DIAGNOSIS — Z20822 Contact with and (suspected) exposure to covid-19: Secondary | ICD-10-CM | POA: Diagnosis not present

## 2021-07-08 ENCOUNTER — Other Ambulatory Visit: Payer: Self-pay | Admitting: Oncology

## 2021-08-28 DIAGNOSIS — M2550 Pain in unspecified joint: Secondary | ICD-10-CM | POA: Diagnosis not present

## 2021-08-28 DIAGNOSIS — M353 Polymyalgia rheumatica: Secondary | ICD-10-CM | POA: Diagnosis not present

## 2021-08-28 DIAGNOSIS — E663 Overweight: Secondary | ICD-10-CM | POA: Diagnosis not present

## 2021-08-28 DIAGNOSIS — Z6829 Body mass index (BMI) 29.0-29.9, adult: Secondary | ICD-10-CM | POA: Diagnosis not present

## 2021-08-28 DIAGNOSIS — Z79899 Other long term (current) drug therapy: Secondary | ICD-10-CM | POA: Diagnosis not present

## 2021-08-28 DIAGNOSIS — M150 Primary generalized (osteo)arthritis: Secondary | ICD-10-CM | POA: Diagnosis not present

## 2021-08-28 DIAGNOSIS — M315 Giant cell arteritis with polymyalgia rheumatica: Secondary | ICD-10-CM | POA: Diagnosis not present

## 2021-09-18 DIAGNOSIS — E785 Hyperlipidemia, unspecified: Secondary | ICD-10-CM | POA: Diagnosis not present

## 2021-09-18 DIAGNOSIS — J45909 Unspecified asthma, uncomplicated: Secondary | ICD-10-CM | POA: Diagnosis not present

## 2021-09-18 DIAGNOSIS — I5032 Chronic diastolic (congestive) heart failure: Secondary | ICD-10-CM | POA: Diagnosis not present

## 2021-09-18 DIAGNOSIS — I10 Essential (primary) hypertension: Secondary | ICD-10-CM | POA: Diagnosis not present

## 2021-10-04 ENCOUNTER — Telehealth: Payer: Self-pay | Admitting: Cardiology

## 2021-10-04 NOTE — Telephone Encounter (Signed)
° °  Pt would like to get her lipid check, she is requesting for Dr. Ellyn Hack to order it

## 2021-10-04 NOTE — Telephone Encounter (Signed)
LMTCB

## 2021-10-05 NOTE — Telephone Encounter (Signed)
I agree that she should have her labs checked with PCP.  I have not seen her in at least a year.  The plan was to see how she did with lifestyle modification prior to considering medications  Glenetta Hew, MD

## 2021-10-05 NOTE — Telephone Encounter (Signed)
Michelle Wilcox with patient who reports that back in August her PCP ordered lipids; ldl was 101. Patient stated she spoke with pharmacist who put her on a statin. She can't remember which one, she's at work; however, she never started taking it. She has lost some weight and is asking for another lipid blood test from Dr. Ellyn Hack. I suggested she speak with her PCP. She has an appointment with PCP next week. I suggested she speak with PCP about blood work, medications, & dietary considerations. I informed her we can request her medical records when she advises our clinic of updates from her PCP.

## 2021-10-12 DIAGNOSIS — M545 Low back pain, unspecified: Secondary | ICD-10-CM | POA: Diagnosis not present

## 2021-10-12 DIAGNOSIS — M62838 Other muscle spasm: Secondary | ICD-10-CM | POA: Diagnosis not present

## 2021-10-12 DIAGNOSIS — M9904 Segmental and somatic dysfunction of sacral region: Secondary | ICD-10-CM | POA: Diagnosis not present

## 2021-10-12 DIAGNOSIS — M9903 Segmental and somatic dysfunction of lumbar region: Secondary | ICD-10-CM | POA: Diagnosis not present

## 2021-10-12 DIAGNOSIS — M9905 Segmental and somatic dysfunction of pelvic region: Secondary | ICD-10-CM | POA: Diagnosis not present

## 2021-10-16 DIAGNOSIS — I5032 Chronic diastolic (congestive) heart failure: Secondary | ICD-10-CM | POA: Diagnosis not present

## 2021-10-16 DIAGNOSIS — M9905 Segmental and somatic dysfunction of pelvic region: Secondary | ICD-10-CM | POA: Diagnosis not present

## 2021-10-16 DIAGNOSIS — J45909 Unspecified asthma, uncomplicated: Secondary | ICD-10-CM | POA: Diagnosis not present

## 2021-10-16 DIAGNOSIS — M85852 Other specified disorders of bone density and structure, left thigh: Secondary | ICD-10-CM | POA: Diagnosis not present

## 2021-10-16 DIAGNOSIS — M9904 Segmental and somatic dysfunction of sacral region: Secondary | ICD-10-CM | POA: Diagnosis not present

## 2021-10-16 DIAGNOSIS — M353 Polymyalgia rheumatica: Secondary | ICD-10-CM | POA: Diagnosis not present

## 2021-10-16 DIAGNOSIS — M545 Low back pain, unspecified: Secondary | ICD-10-CM | POA: Diagnosis not present

## 2021-10-16 DIAGNOSIS — M9903 Segmental and somatic dysfunction of lumbar region: Secondary | ICD-10-CM | POA: Diagnosis not present

## 2021-10-16 DIAGNOSIS — D5911 Warm autoimmune hemolytic anemia: Secondary | ICD-10-CM | POA: Diagnosis not present

## 2021-10-16 DIAGNOSIS — F419 Anxiety disorder, unspecified: Secondary | ICD-10-CM | POA: Diagnosis not present

## 2021-10-16 DIAGNOSIS — I10 Essential (primary) hypertension: Secondary | ICD-10-CM | POA: Diagnosis not present

## 2021-10-16 DIAGNOSIS — E785 Hyperlipidemia, unspecified: Secondary | ICD-10-CM | POA: Diagnosis not present

## 2021-10-30 ENCOUNTER — Telehealth: Payer: Self-pay | Admitting: Cardiology

## 2021-10-30 ENCOUNTER — Encounter: Payer: Self-pay | Admitting: Cardiology

## 2021-10-30 DIAGNOSIS — M545 Low back pain, unspecified: Secondary | ICD-10-CM | POA: Diagnosis not present

## 2021-10-30 DIAGNOSIS — M9903 Segmental and somatic dysfunction of lumbar region: Secondary | ICD-10-CM | POA: Diagnosis not present

## 2021-10-30 DIAGNOSIS — M9905 Segmental and somatic dysfunction of pelvic region: Secondary | ICD-10-CM | POA: Diagnosis not present

## 2021-10-30 DIAGNOSIS — M9904 Segmental and somatic dysfunction of sacral region: Secondary | ICD-10-CM | POA: Diagnosis not present

## 2021-10-30 NOTE — Telephone Encounter (Signed)
? ? ?  Chief Complaint  ?Patient presents with  ? LAB RESULTS  ?  Labs from  PCP  ? ?Patient has an appointment in May 2023 ? ?Labs from PCP 10/16/2021 ?Na+ 141, K+ 5.2, Cl- 103, HCO3-33, BUN 20, Cr 0.72, Glu 101, Ca2+ 9.7; AST 21, ALT 11, AlkP 94 ? ?TC 196, TG 152, HDL 62, LDL 107 ? ? ? ?Glenetta Hew, MD ? ?

## 2021-10-30 NOTE — Progress Notes (Signed)
This encounter was created in error - please disregard.

## 2021-11-13 DIAGNOSIS — M9904 Segmental and somatic dysfunction of sacral region: Secondary | ICD-10-CM | POA: Diagnosis not present

## 2021-11-13 DIAGNOSIS — M545 Low back pain, unspecified: Secondary | ICD-10-CM | POA: Diagnosis not present

## 2021-11-13 DIAGNOSIS — W228XXA Striking against or struck by other objects, initial encounter: Secondary | ICD-10-CM | POA: Diagnosis not present

## 2021-11-13 DIAGNOSIS — M9903 Segmental and somatic dysfunction of lumbar region: Secondary | ICD-10-CM | POA: Diagnosis not present

## 2021-11-13 DIAGNOSIS — S99921A Unspecified injury of right foot, initial encounter: Secondary | ICD-10-CM | POA: Diagnosis not present

## 2021-11-13 DIAGNOSIS — M9905 Segmental and somatic dysfunction of pelvic region: Secondary | ICD-10-CM | POA: Diagnosis not present

## 2021-12-11 DIAGNOSIS — M9905 Segmental and somatic dysfunction of pelvic region: Secondary | ICD-10-CM | POA: Diagnosis not present

## 2021-12-11 DIAGNOSIS — M545 Low back pain, unspecified: Secondary | ICD-10-CM | POA: Diagnosis not present

## 2021-12-11 DIAGNOSIS — M9904 Segmental and somatic dysfunction of sacral region: Secondary | ICD-10-CM | POA: Diagnosis not present

## 2021-12-11 DIAGNOSIS — M9903 Segmental and somatic dysfunction of lumbar region: Secondary | ICD-10-CM | POA: Diagnosis not present

## 2021-12-26 DIAGNOSIS — Z20822 Contact with and (suspected) exposure to covid-19: Secondary | ICD-10-CM | POA: Diagnosis not present

## 2022-01-06 DIAGNOSIS — L089 Local infection of the skin and subcutaneous tissue, unspecified: Secondary | ICD-10-CM | POA: Diagnosis not present

## 2022-01-06 DIAGNOSIS — T63421A Toxic effect of venom of ants, accidental (unintentional), initial encounter: Secondary | ICD-10-CM | POA: Diagnosis not present

## 2022-01-08 ENCOUNTER — Ambulatory Visit: Payer: Medicare Other | Admitting: Cardiology

## 2022-01-08 DIAGNOSIS — I5032 Chronic diastolic (congestive) heart failure: Secondary | ICD-10-CM | POA: Diagnosis not present

## 2022-01-08 DIAGNOSIS — J45909 Unspecified asthma, uncomplicated: Secondary | ICD-10-CM | POA: Diagnosis not present

## 2022-01-08 DIAGNOSIS — E785 Hyperlipidemia, unspecified: Secondary | ICD-10-CM | POA: Diagnosis not present

## 2022-01-08 DIAGNOSIS — I10 Essential (primary) hypertension: Secondary | ICD-10-CM | POA: Diagnosis not present

## 2022-01-09 ENCOUNTER — Telehealth: Payer: Self-pay | Admitting: *Deleted

## 2022-01-09 NOTE — Telephone Encounter (Signed)
RN notified that pt was bitten by fire ants last week while picking up trash in parking lot of workplace. She stepped in a small hill of fireants and felt the bites. She reports the bites were itchy and burned some at the time of the incident but sx worsened over the weekend when area became swollen, red, and painful.  She was seen at a local UC and told she had a delayed allergic reaction to the bites with an infection present as well. Started on prednisone, doxycycline, and Benadryl. She reports sx have improved significantly on meds. Several bite marks noted over R lower extremity. Mild excoriation noted over shin and some of the larger bites. Scabs present on some of the bites. Skin dry. Applied aquaphor to area and provided with clinic otc stock extras. Advised if sx worsen, do no continue to improve, return to clinic. Pt agreeable and denies further needs or concerns.

## 2022-01-10 ENCOUNTER — Encounter: Payer: Self-pay | Admitting: *Deleted

## 2022-01-10 NOTE — Telephone Encounter (Signed)
RN Hildred Alamin discussed case with NP 5/23.  Reviewed RN Hildred Alamin note agreed with plan of care.  Clinic closed 5/24 routine.  Will follow up with patient 01/11/22 re-evaluation.

## 2022-01-11 NOTE — Telephone Encounter (Signed)
Noted reviewed RN Hildred Alamin note and will follow up with patient again next week.  Clinic closed 27-29 May for [facility] Day holiday.

## 2022-01-11 NOTE — Telephone Encounter (Signed)
Saw pt at workstation. She reports area is still itchy at times. Prefers hydrocortisone cream over aquaphor, so continuing that as needed. Area looks same as it did on Tuesday, no worsening. No s/sx infection. Pt has completed abx and steroids now. Continues benadryl prn. Will notify clinic if any new concerns or worsening sx.

## 2022-01-22 ENCOUNTER — Other Ambulatory Visit: Payer: Self-pay | Admitting: Nurse Practitioner

## 2022-01-22 ENCOUNTER — Ambulatory Visit: Payer: Self-pay

## 2022-01-22 ENCOUNTER — Inpatient Hospital Stay (HOSPITAL_COMMUNITY)
Admission: RE | Admit: 2022-01-22 | Discharge: 2022-01-22 | Disposition: A | Discharge status: Home / Self Care | Payer: Medicare Other | Source: Ambulatory Visit | Attending: Family Medicine | Admitting: Family Medicine

## 2022-01-22 ENCOUNTER — Telehealth: Payer: Self-pay | Admitting: *Deleted

## 2022-01-22 DIAGNOSIS — M25512 Pain in left shoulder: Secondary | ICD-10-CM

## 2022-01-22 DIAGNOSIS — R079 Chest pain, unspecified: Secondary | ICD-10-CM

## 2022-01-22 NOTE — Telephone Encounter (Signed)
Incident report submtited to clinic by pt's supervisor. On 6/2, pt tripped over the pedal of a piece of equipment in the [place] and fell forward onto knees and chest. Injuries reported at that time was a bruised knee, chest soreness, and soreness in big toe of right foot.  Pt works Tues-Sat so unable to speak with pt in person today. Called her on personal cell to see how she was feeling and if she had been seen anywhere for the injuries over the weekend.  No answer. LVMRCB. HR made aware of same.

## 2022-01-24 ENCOUNTER — Encounter: Payer: Self-pay | Admitting: *Deleted

## 2022-01-24 NOTE — Telephone Encounter (Signed)
Noted Reviewed RN Hildred Alamin note and injury report from supervisor.  Case discussed  with safety manager Rachel Bo and RN Hildred Alamin.  RN Hildred Alamin notified me patient seen by [employer] NP Doren Custard Tuesday given work restrictions and employer unable to accommodate onsite and employee staying at home today.  Possible rib fracture nondisplaced  CLINICAL DATA:  pain left lateral chest, fall   EXAM: CHEST - 2 VIEW   COMPARISON:  Radiograph 08/03/2017   FINDINGS: Unchanged cardiomediastinal silhouette. New focal opacity at the left basilar peripheral chest/costophrenic angle. There is no pneumothorax or pleural effusion. Right lung is clear. There is a possible nondisplaced posterior left sixth rib fracture.   IMPRESSION: Possible nondisplaced left posterior sixth rib fracture. New focal opacity in the left basilar peripheral chest, could be atelectasis or contusion given recent fall, developing infection possible.     Electronically Signed   By: Maurine Simmering M.D.   On: 01/22/2022 15:49  CLINICAL DATA:  Left shoulder pain   EXAM: LEFT SHOULDER - 2+ VIEW   COMPARISON:  None Available.   FINDINGS: There is no evidence of acute fracture. Alignment is normal. There is moderate glenohumeral and acromioclavicular joint osteoarthritis.   IMPRESSION: No evidence of acute fracture. Moderate glenohumeral and acromioclavicular joint osteoarthritis.

## 2022-01-25 NOTE — Telephone Encounter (Signed)
Patient rash improving see tcon dated 01/22/22

## 2022-01-25 NOTE — Telephone Encounter (Signed)
Spoke with patient via telephone.  She stated still not doing much activity as worsens pain in rib but feeling better and has been able to cut down on her pain medication.  Next scheduled follow up appt 14 Jun and out of work until that time.  Denied hemoptysis, wheezing, dyspnea, shortness of breath, worsening or new chest pain  Discussed with patient I saw xray report stating possible rib fracture as she was unsure if on report.  A&Ox3 spoke full sentences without difficulty no cough/wheezing audible during 5 minute call.  Discussed with patient RN Hildred Alamin or I would follow up with her again next week via telephone.  Discussed if new or worsening symptoms to notify clinic staff/seek re-evaluation by a provider.  Patient verbalized understanding information/instructions, agreed with plan of care and had no further questions at this time.  HR Tim/Tonya notified out of work until re-evaluation 14 Jun.

## 2022-01-29 ENCOUNTER — Ambulatory Visit: Payer: Medicare Other | Admitting: Nurse Practitioner

## 2022-01-31 ENCOUNTER — Ambulatory Visit: Payer: Self-pay

## 2022-01-31 ENCOUNTER — Other Ambulatory Visit: Payer: Self-pay | Admitting: Nurse Practitioner

## 2022-01-31 DIAGNOSIS — S20212D Contusion of left front wall of thorax, subsequent encounter: Secondary | ICD-10-CM

## 2022-02-07 NOTE — Telephone Encounter (Signed)
Notified by [employer] HR patient still on restrictions and unable to accommodate in workcenter so at home.  Patient contacted via telephone and stated she had appt today with [facility] Drucilla Chalet NP  PT x 4 session ordered and awaiting approval from [employer].  Patient still has left shoulder/chest discomfort and right big toe pain.  Knee pain/bruising/swelling has resolved per patient.  Patient reported she left message for [employer] with above information today. Patient denied further questions or concerns at this time.   Discussed with patient I am on vacation through 5 Jul and will follow up with her 6 Jul if RN Hildred Alamin leaves message she still has not returned to work next week.  RN Hildred Alamin to follow up with patient next week.  Patient A&Ox3 spoke full sentences without difficulty.  Patient verbalized understanding information/instructions, agreed with plan of care and had no further questions at this time.

## 2022-02-26 ENCOUNTER — Telehealth: Payer: Self-pay | Admitting: Registered Nurse

## 2022-02-26 DIAGNOSIS — E669 Obesity, unspecified: Secondary | ICD-10-CM | POA: Diagnosis not present

## 2022-02-26 DIAGNOSIS — W19XXXD Unspecified fall, subsequent encounter: Secondary | ICD-10-CM

## 2022-02-26 DIAGNOSIS — M1991 Primary osteoarthritis, unspecified site: Secondary | ICD-10-CM | POA: Diagnosis not present

## 2022-02-26 DIAGNOSIS — D591 Autoimmune hemolytic anemia, unspecified: Secondary | ICD-10-CM | POA: Diagnosis not present

## 2022-02-26 DIAGNOSIS — M353 Polymyalgia rheumatica: Secondary | ICD-10-CM | POA: Diagnosis not present

## 2022-02-26 DIAGNOSIS — Z6830 Body mass index (BMI) 30.0-30.9, adult: Secondary | ICD-10-CM | POA: Diagnosis not present

## 2022-02-26 DIAGNOSIS — M315 Giant cell arteritis with polymyalgia rheumatica: Secondary | ICD-10-CM | POA: Diagnosis not present

## 2022-03-02 NOTE — Telephone Encounter (Signed)
See tcon dated 02/27/22 patient still on work restrictions and had follow up with NP Dixon today at [facility].  Next follow up to be scheduled after she completes 7 PT sessions.  Still having extremity pain.

## 2022-03-02 NOTE — Telephone Encounter (Signed)
Patient contacted via telephone saw NP Dixon today and was given another 7 sessions of PT and maintain current restrictions.  Patient to schedule follow up appt with NP Dixon after completing her PT sessions or sooner if pain and AROM limitations resolve.  Patient stated still having pain in extremity.  Patient asked that I notify her supervisor Dayna Barker and [place] and email work restrictions note.  Patient stated she called and left voicemail on RN [facility] telephone line earlier this week and tried to reach me on personal number but no answer and no voicemail.    Discussed with patient I would follow up with her in 2 weeks again and give her supervisor and HR message/information.  Patient verbalized understanding information/instructions, agreed with plan of care and had no further questions at this time.

## 2022-03-05 ENCOUNTER — Encounter: Payer: Self-pay | Admitting: Nurse Practitioner

## 2022-03-05 ENCOUNTER — Ambulatory Visit (INDEPENDENT_AMBULATORY_CARE_PROVIDER_SITE_OTHER): Payer: Medicare Other | Admitting: Nurse Practitioner

## 2022-03-05 VITALS — BP 138/78 | HR 72 | Ht 64.0 in | Wt 176.8 lb

## 2022-03-05 DIAGNOSIS — I5189 Other ill-defined heart diseases: Secondary | ICD-10-CM

## 2022-03-05 DIAGNOSIS — I10 Essential (primary) hypertension: Secondary | ICD-10-CM

## 2022-03-05 DIAGNOSIS — E782 Mixed hyperlipidemia: Secondary | ICD-10-CM | POA: Diagnosis not present

## 2022-03-05 NOTE — Progress Notes (Signed)
Office Visit    Patient Name: Michelle Wilcox Date of Encounter: 03/05/2022  Primary Care Provider:  Cari Caraway, MD Primary Cardiologist:  Glenetta Hew, MD  Chief Complaint    76 year old female with a history of diastolic dysfunction, hypertension, hyperlipidemia, polymyalgia rheumatica, giant cell arteritis, anxiety, and depression who presents for follow-up related to hypertension and heart failure.  Past Medical History    Past Medical History:  Diagnosis Date   Anxiety    hx of   Asthma    at times   CHF (congestive heart failure) (HCC)    Depression    hx of   Dyspnea    Giant cell arteritis (HCC)    Gouty arthropathy    Hemolytic anemia (HCC)    Hyperlipidemia    Hypertension    Irregular heart beat    extra beat   Migraines    Polymyalgia (HCC)    Polymyalgia rheumatica (HCC)    Temporal arteritis ([place])    Past Surgical History:  Procedure Laterality Date   BARTHOLIN GLAND CYST EXCISION     non ca   BREAST BIOPSY Right    EYE MUSCLE SURGERY     as a child 88 yrs old   KNEE SURGERY  2004   rt knee   LAPAROSCOPIC SPLENECTOMY N/A 06/18/2018   Procedure: LAPAROSCOPIC SPLENECTOMY ERAS PATHWAY;  Surgeon: Excell Seltzer, MD;  Location: [facility];  Service: General;  Laterality: N/A;   toncil     TONSILLECTOMY     as a child    Allergies  Allergies  Allergen Reactions   Penicillins Anaphylaxis    Has patient had a PCN reaction causing immediate rash, facial/tongue/throat swelling, SOB or lightheadedness with hypotension: yes Has patient had a PCN reaction causing severe rash involving mucus membranes or skin necrosis: no Has patient had a PCN reaction that required hospitalization: yes Has patient had a PCN reaction occurring within the last 10 years: no If all of the above answers are "NO", then may proceed with Cephalosporin use.    Almond (Diagnostic) Hives   Ceftin [Cefuroxime Axetil] Hives    headaches   Ciprofloxacin Hives     headaches   Darvocet [Propoxyphene N-Acetaminophen]     headaches   Lactose Intolerance (Gi) Diarrhea   Levaquin [Levofloxacin In D5w] Hives   Sulfa Antibiotics Hives   Zyrtec [Cetirizine Hcl]     headache   Other     Powder in gloves - Rash  MSG - Hives  Swiss Cheese - Hives       History of Present Illness    76 year old female with the above past medical history including diastolic dysfunction, hypertension, hyperlipidemia, polymyalgia rheumatica, giant cell arteritis, anxiety, and depression who presents for follow-up related to hypertension and heart failure.  She was hospitalized in December 2018 in the setting of lower extremity edema, shortness of breath, generalized weakness.  Echocardiogram at the time showed EF 60 to 65%, G1 DD, normal RV size and systolic function, mild mitral valve regurgitation, mild pulmonary hypertension.  She was diuresed with IV Lasix. Lexiscan Myoview in July 2019 was low risk, no evidence of ischemia, EF 69%. She was last seen in the office on 09/26/2020 and was stable from a cardiac standpoint.  She denied symptoms concerning for angina, denied worsening dyspnea.  She presents today for follow-up.  At her last visit she has been stable from a cardiac standpoint.  She did have a mechanical fall while working and has been  out of work since June.  She denies any dyspnea, edema, weight gain, PND, orthopnea.  Denies any symptoms concerning for angina.  Other than her injury sustained during recent fall, she denies any additional concerns today.  Home Medications    Current Outpatient Medications  Medication Sig Dispense Refill   acetaminophen (TYLENOL) 650 MG CR tablet Take 1,300 mg by mouth 2 (two) times daily.     diphenhydrAMINE (BENADRYL) 25 MG tablet Take 25 mg by mouth 4 (four) times daily as needed.     folic acid (FOLVITE) 1 MG tablet TAKE 1 TABLET(1 MG) BY MOUTH DAILY 90 tablet 3   furosemide (LASIX) 40 MG tablet Take 20-40 mg by mouth daily.      lactase (LACTAID) 3000 units tablet Take 6,000-9,000 Units by mouth as needed (consuming dairy products).     loperamide (IMODIUM A-D) 2 MG tablet Take 2 mg by mouth as needed for diarrhea or loose stools (takes 2 if needed).     loratadine (CLARITIN) 10 MG tablet Take 10 mg by mouth daily.     metoprolol tartrate (LOPRESSOR) 50 MG tablet Take 25-50 mg by mouth See admin instructions. Take 1 tablet (50 mg total) by mouth every morning AND take 0.5 tablet (25 mg total) by mouth every evening.     Multiple Vitamin (MULTIVITAMIN) tablet Take 1 tablet by mouth every evening.      omeprazole (PRILOSEC OTC) 20 MG tablet Take 20 mg by mouth every morning.      potassium chloride SA (K-DUR,KLOR-CON) 20 MEQ tablet Take 10-20 mEq by mouth daily.     predniSONE (DELTASONE) 5 MG tablet TAKE 1 TABLET(5 MG) BY MOUTH DAILY WITH BREAKFAST 90 tablet 3   Probiotic Product (ALIGN) 4 MG CAPS Take 4 mg by mouth every morning.     rosuvastatin (CRESTOR) 5 MG tablet Take 1 tablet by mouth every other day.     simethicone (MYLICON) 80 MG chewable tablet Chew 80 mg by mouth every 6 (six) hours as needed for flatulence.      VENTOLIN HFA 108 (90 Base) MCG/ACT inhaler Take 1-2 puffs by mouth every 4 (four) hours as needed for shortness of breath.  0   Vitamin D3 (VITAMIN D) 25 MCG tablet Take 1,000 Units by mouth every other day.     No current facility-administered medications for this visit.     Review of Systems    She denies chest pain, palpitations, dyspnea, pnd, orthopnea, n, v, dizziness, syncope, edema, weight gain, or early satiety. All other systems reviewed and are otherwise negative except as noted above.   Physical Exam    VS:  BP 138/78   Pulse 72   Ht '5\' 4"'$  (1.626 m)   Wt 176 lb 12.8 oz (80.2 kg)   SpO2 97%   BMI 30.35 kg/m   GEN: Well nourished, well developed, in no acute distress. HEENT: normal. Neck: Supple, no JVD, carotid bruits, or masses. Cardiac: RRR, no murmurs, rubs, or gallops. No  clubbing, cyanosis, edema.  Radials/DP/PT 2+ and equal bilaterally.  Respiratory:  Respirations regular and unlabored, clear to auscultation bilaterally. GI: Soft, nontender, nondistended, BS + x 4. MS: no deformity or atrophy. Skin: warm and dry, no rash. Neuro:  Strength and sensation are intact. Psych: Normal affect.  Accessory Clinical Findings    ECG personally reviewed by me today -sinus rhythm, 72 bpm, PACs- no acute changes.  Lab Results  Component Value Date   WBC 9.4 05/16/2021   HGB  13.8 05/16/2021   HCT 42.5 05/16/2021   MCV 97.7 05/16/2021   PLT 371 05/16/2021   Lab Results  Component Value Date   CREATININE 0.61 06/19/2018   BUN 9 06/19/2018   NA 144 06/19/2018   K 3.9 06/19/2018   CL 108 06/19/2018   CO2 29 06/19/2018   Lab Results  Component Value Date   ALT 10 04/07/2018   AST 19 04/07/2018   GGT 22 03/13/2016   ALKPHOS 61 04/07/2018   BILITOT 0.5 04/07/2018   Lab Results  Component Value Date   CHOL 145 03/13/2016   HDL 55 03/13/2016   LDLCALC 69 03/13/2016   TRIG 103 03/13/2016   CHOLHDL 2.6 03/13/2016    Lab Results  Component Value Date   HGBA1C <4.2 (L) 03/13/2016    Assessment & Plan    1.  Grade 1 diastolic dysfunction: Echo in December 2018 showed EF 60 to 65%, G1 DD, normal RV size and systolic function, mild mitral valve regurgitation, mild pulmonary hypertension. Euvolemic and well compensated on exam.  Continue metoprolol, Lasix.  2. Hypertension: BP well controlled. Continue current antihypertensive regimen.   3. Hyperlipidemia: LDL was 107 in February 2023.  Continue Crestor.  4. Disposition: Follow-up in 1 year.  Lenna Sciara, NP 03/05/2022, 6:06 PM

## 2022-03-05 NOTE — Patient Instructions (Signed)
Medication Instructions:  ?Your physician recommends that you continue on your current medications as directed. Please refer to the Current Medication list given to you today.  ? ?*If you need a refill on your cardiac medications before your next appointment, please call your pharmacy* ? ? ?Lab Work: ?NONE ordered at this time of appointment  ? ?If you have labs (blood work) drawn today and your tests are completely normal, you will receive your results only by: ?MyChart Message (if you have MyChart) OR ?A paper copy in the mail ?If you have any lab test that is abnormal or we need to change your treatment, we will call you to review the results. ? ? ?Testing/Procedures: ?NONE ordered at this time of appointment  ? ? ? ?Follow-Up: ?At [facility], you and your health needs are our priority.  As part of our continuing mission to provide you with exceptional heart care, we have created designated Provider Care Teams.  These Care Teams include your primary Cardiologist (physician) and Advanced Practice Providers (APPs -  Physician Assistants and Nurse Practitioners) who all work together to provide you with the care you need, when you need it. ? ?We recommend signing up for the patient portal called "MyChart".  Sign up information is provided on this After Visit Summary.  MyChart is used to connect with patients for Virtual Visits (Telemedicine).  Patients are able to view lab/test results, encounter notes, upcoming appointments, etc.  Non-urgent messages can be sent to your provider as well.   ?To learn more about what you can do with MyChart, go to [email or web address].   ? ?Your next appointment:   ?1 year(s) ? ?The format for your next appointment:   ?In Person ? ?Provider:   ?David Harding, MD   ? ? ?Other Instructions ? ? ?Important Information About Sugar ? ? ? ? ? ? ?

## 2022-03-07 NOTE — Telephone Encounter (Signed)
[place] accessed and work note from Michelle Chalet NP given to [employer] per patient request 03/06/22.  [place] [address] [place], [place] [address] [place] Phone [phone]  Contact [name] [employer] Fax [phone]  [email or web address] Email [name] Employer Discharge Summary Patient Name [id number] Patient ID Left Clinic Injury Date 01/19/2022 Examining Provider Michelle Chalet FNP-C Exam Date 03/02/2022 Restricted Work Days 3:02 PM Work Comp Follow-up Visit [id number] Arrived Clinic 2:33 PM  29 0 Work Status Summary Effective 03/02/2022, Michelle Wilcox is released to work on a modified basis: Work Restrictions Restricted duty- no pushing, pulling, or lifting above 5 lbs. No lifting above  shoulder height. If unable to accommodate please take out of work completely. Call clinic to make appointment after last visit with PT. Prescription Date of Med I tripped on the floor pedal to our wrapping machine and fell forcefully on my left  chest, knee, and right big toe. - Per Patient. Left chest back of shoulder, knee left,  big toe right. Tln Patient Injury Description Diagnosis S40.012A Contusion of left shoulder, initial encounter S20.212A Contusion of left front wall of thorax, initial encounter S80.02XA Contusion of left knee, initial encounter S90.111A Contusion of right great toe without damage to nail, initial  encounter *Services Summary OV Est Level 4 *This is a general overview of the visit, it is not a complete list of billable services

## 2022-03-13 ENCOUNTER — Other Ambulatory Visit: Payer: Self-pay | Admitting: Family Medicine

## 2022-03-13 DIAGNOSIS — Z1231 Encounter for screening mammogram for malignant neoplasm of breast: Secondary | ICD-10-CM

## 2022-03-20 ENCOUNTER — Other Ambulatory Visit: Payer: Self-pay | Admitting: Oncology

## 2022-03-20 DIAGNOSIS — D591 Autoimmune hemolytic anemia, unspecified: Secondary | ICD-10-CM

## 2022-03-21 NOTE — Telephone Encounter (Signed)
Patient contacted via telephone.  Stated her husband with Stage IV cancer has very high fever 106 so she took him to [place] and he was being treated for sepsis and just moved today to [facility] inpatient.  She hasn't been able to sleep the past two nights at his bedside.  She had done PT visits prior to his illness, they are helping and was told by her PT provider they are requesting more visits from Drucilla Chalet NP.  Patient doesn't feel able/ready to come back to work yet.  She plans to call NP [facility] tomorrow.  She asked me to notify HR Tonya of above information.  Message left for HR Tim/Tonya.  Encouraged patient to nap today before driving home as she plans to drive home from hospital to sleep at home tonight.  Will follow up with patient again next week via telephone.  Patient verbalized understanding information and had no further questions at this time.

## 2022-04-09 DIAGNOSIS — I10 Essential (primary) hypertension: Secondary | ICD-10-CM | POA: Diagnosis not present

## 2022-04-09 DIAGNOSIS — M85852 Other specified disorders of bone density and structure, left thigh: Secondary | ICD-10-CM | POA: Diagnosis not present

## 2022-04-16 ENCOUNTER — Ambulatory Visit
Admission: RE | Admit: 2022-04-16 | Discharge: 2022-04-16 | Disposition: A | Discharge status: Home / Self Care | Payer: Medicare Other | Source: Ambulatory Visit | Attending: Family Medicine | Admitting: Family Medicine

## 2022-04-16 DIAGNOSIS — Z1231 Encounter for screening mammogram for malignant neoplasm of breast: Secondary | ICD-10-CM

## 2022-04-16 DIAGNOSIS — J45909 Unspecified asthma, uncomplicated: Secondary | ICD-10-CM | POA: Diagnosis not present

## 2022-04-16 DIAGNOSIS — Z23 Encounter for immunization: Secondary | ICD-10-CM | POA: Diagnosis not present

## 2022-04-16 DIAGNOSIS — M353 Polymyalgia rheumatica: Secondary | ICD-10-CM | POA: Diagnosis not present

## 2022-04-16 DIAGNOSIS — Z6831 Body mass index (BMI) 31.0-31.9, adult: Secondary | ICD-10-CM | POA: Diagnosis not present

## 2022-04-16 DIAGNOSIS — I5032 Chronic diastolic (congestive) heart failure: Secondary | ICD-10-CM | POA: Diagnosis not present

## 2022-04-16 DIAGNOSIS — I10 Essential (primary) hypertension: Secondary | ICD-10-CM | POA: Diagnosis not present

## 2022-04-16 DIAGNOSIS — E785 Hyperlipidemia, unspecified: Secondary | ICD-10-CM | POA: Diagnosis not present

## 2022-04-16 DIAGNOSIS — F419 Anxiety disorder, unspecified: Secondary | ICD-10-CM | POA: Diagnosis not present

## 2022-04-16 DIAGNOSIS — K219 Gastro-esophageal reflux disease without esophagitis: Secondary | ICD-10-CM | POA: Diagnosis not present

## 2022-04-16 DIAGNOSIS — D5911 Warm autoimmune hemolytic anemia: Secondary | ICD-10-CM | POA: Diagnosis not present

## 2022-04-27 NOTE — Telephone Encounter (Signed)
Patient returned call and saw NP Dixon yesterday that she is still on restrictions next appt 18 Sep and hopefully will be released from restrictions with NP Dixon Occ Med.  Still has PT ongoing through this month.  Pain has resolved inside upper arm and back of shoulder.  Top of shoulder still the painful areas and pain improving.  Working on strengthening these areas in [place].  Last PT appt was today.  2 sessions scheduled next week and another 18 Sep after her Occ Med appt with NP Dixon.  Patient stated she left message for Kenney Houseman and [employer] yesterday.  Notified patient Kenney Houseman out of the office until Monday 04/30/22.  Notified patient I would give HR Tim her updates.  Patient also stated she notified her supervisor Janyth Contes.  Patient notified I would follow up with her next 05/08/22.  Patient stated she hopes to be at work that day.  Discussed if not at work I will call her.  Patient verbalized understanding information/instructions, agreed with plan of care and had no further questions at this time.

## 2022-05-04 DIAGNOSIS — Z23 Encounter for immunization: Secondary | ICD-10-CM | POA: Diagnosis not present

## 2022-05-07 DIAGNOSIS — Z23 Encounter for immunization: Secondary | ICD-10-CM | POA: Diagnosis not present

## 2022-05-18 NOTE — Telephone Encounter (Signed)
Patient seen in workcenter today.  Denied problems or concerns with ability to perform work.  Denied further questions or concerns.  A&Ox3 respirations even and unlabored RA spoke full sentences without difficulty.  Encounter closed.

## 2022-05-28 DIAGNOSIS — Z1389 Encounter for screening for other disorder: Secondary | ICD-10-CM | POA: Diagnosis not present

## 2022-05-28 DIAGNOSIS — Z0000 Encounter for general adult medical examination without abnormal findings: Secondary | ICD-10-CM | POA: Diagnosis not present

## 2022-05-28 DIAGNOSIS — Z23 Encounter for immunization: Secondary | ICD-10-CM | POA: Diagnosis not present

## 2022-06-14 ENCOUNTER — Ambulatory Visit: Payer: Medicare Other | Admitting: Registered Nurse

## 2022-06-14 ENCOUNTER — Encounter: Payer: Self-pay | Admitting: Registered Nurse

## 2022-06-14 VITALS — BP 134/77 | HR 75 | Temp 97.5°F | Resp 16

## 2022-06-14 DIAGNOSIS — L03114 Cellulitis of left upper limb: Secondary | ICD-10-CM

## 2022-06-14 DIAGNOSIS — T63481A Toxic effect of venom of other arthropod, accidental (unintentional), initial encounter: Secondary | ICD-10-CM

## 2022-06-14 MED ORDER — DIPHENHYDRAMINE HCL 2 % EX GEL: 1.0000 | Freq: Two times a day (BID) | CUTANEOUS | Status: AC | PRN

## 2022-06-14 MED ORDER — HYDROCORTISONE 1 % EX LOTN: 1.0000 | TOPICAL_LOTION | Freq: Two times a day (BID) | CUTANEOUS | 0 refills | Status: AC

## 2022-06-14 MED ORDER — PREDNISONE 10 MG PO TABS: ORAL_TABLET | ORAL | 0 refills | Status: AC

## 2022-06-14 MED ORDER — DOXYCYCLINE HYCLATE 100 MG PO TABS: 100.0000 mg | ORAL_TABLET | Freq: Two times a day (BID) | ORAL | 0 refills | Status: AC

## 2022-06-14 NOTE — Patient Instructions (Signed)
Cellulitis, Adult  Cellulitis is a skin infection. The infected area is usually warm, red, swollen, and tender. This condition occurs most often in the arms and lower legs. The infection can travel to the muscles, blood, and underlying tissue and become serious. It is very important to get treated for this condition. What are the causes? Cellulitis is caused by bacteria. The bacteria enter through a break in the skin, such as a cut, burn, insect bite, open sore, or crack. What increases the risk? This condition is more likely to occur in people who: Have a weak body defense system (immune system). Have open wounds on the skin, such as cuts, burns, bites, and scrapes. Bacteria can enter the body through these open wounds. Are older than 77 years of age. Have diabetes. Have a type of long-lasting (chronic) liver disease (cirrhosis) or kidney disease. Are obese. Have a skin condition such as: Itchy rash (eczema). Slow movement of blood in the veins (venous stasis). Fluid buildup below the skin (edema). Have had radiation therapy. Use IV drugs. What are the signs or symptoms? Symptoms of this condition include: Redness, streaking, or spotting on the skin. Swollen area of the skin. Tenderness or pain when an area of the skin is touched. Warm skin. A fever. Chills. Blisters. How is this diagnosed? This condition is diagnosed based on a medical history and physical exam. You may also have tests, including: Blood tests. Imaging tests. How is this treated? Treatment for this condition may include: Medicines, such as antibiotic medicines or medicines to treat allergies (antihistamines). Supportive care, such as rest and application of cold or warm cloths (compresses) to the skin. Hospital care, if the condition is severe. The infection usually starts to get better within 1-2 days of treatment. Follow these instructions at home:  Medicines Take over-the-counter and prescription  medicines only as told by your health care provider. If you were prescribed an antibiotic medicine, take it as told by your health care provider. Do not stop taking the antibiotic even if you start to feel better. General instructions Drink enough fluid to keep your urine pale yellow. Do not touch or rub the infected area. Raise (elevate) the infected area above the level of your heart while you are sitting or lying down. Apply warm or cold compresses to the affected area as told by your health care provider. Keep all follow-up visits as told by your health care provider. This is important. These visits let your health care provider make sure a more serious infection is not developing. Contact a health care provider if: You have a fever. Your symptoms do not begin to improve within 1-2 days of starting treatment. Your bone or joint underneath the infected area becomes painful after the skin has healed. Your infection returns in the same area or another area. You notice a swollen bump in the infected area. You develop new symptoms. You have a general ill feeling (malaise) with muscle aches and pains. Get help right away if: Your symptoms get worse. You feel very sleepy. You develop vomiting or diarrhea that persists. You notice red streaks coming from the infected area. Your red area gets larger or turns dark in color. These symptoms may represent a serious problem that is an emergency. Do not wait to see if the symptoms will go away. Get medical help right away. Call your local emergency services (911 in the [place] Do not drive yourself to the hospital. Summary Cellulitis is a skin infection. This condition occurs most   often in the arms and lower legs. Treatment for this condition may include medicines, such as antibiotic medicines or antihistamines. Take over-the-counter and prescription medicines only as told by your health care provider. If you were prescribed an antibiotic medicine, do  not stop taking the antibiotic even if you start to feel better. Contact a health care provider if your symptoms do not begin to improve within 1-2 days of starting treatment or your symptoms get worse. Keep all follow-up visits as told by your health care provider. This is important. These visits let your health care provider make sure that a more serious infection is not developing. This information is not intended to replace advice given to you by your health care provider. Make sure you discuss any questions you have with your health care provider. Document Revised: 05/17/2021 Document Reviewed: 05/18/2021 Elsevier Patient Education  [place], [place], or [employer], Adult Bees, wasps, and hornets are part of a family of insects that sting. Normally, a sting will cause pain, redness, and swelling at the sting site. However, some people have an allergy to these stings, and their reactions can be much more serious. What increases the risk? You may be at a greater risk of getting stung if you: Provoke a stinging insect by swatting or disturbing it. Wear strong-smelling soaps, deodorants, or body sprays. Spend time outdoors near gardens with flowers or fruit trees or in clothes that expose skin. Eat or drink outside. What are the signs or symptoms? The reaction to an insect sting can vary from a mild, normal response to life-threatening anaphylaxis. The sting site is often a red lump in the skin, sometimes with a tiny hole in the center, that may still have the stinger in the center of the wound. Normal reaction A normal reaction is experienced by most people after an insect sting. Symptoms include: Pain, redness, and swelling at the sting site. These can develop over 24-48 hours. Pain, redness, and swelling that may spread to a larger, connected area beyond the sting site. The spreading can continue over 24-48 hours. Allergic reaction An allergic reaction can vary in severity and  includes symptoms in other areas of the body beyond the sting site. People who experience an allergic reaction have a higher risk of having similar or worse symptoms the next time they are stung. Symptoms may include: Hives, itching, and swelling. Abdominal symptoms including cramping, nausea, vomiting, and diarrhea. Severe symptoms that require immediate medical attention include: Chest pain or tightness. Wheezing or trouble breathing. Swelling of the tongue, throat, or lips. Trouble swallowing or hoarse voice. Anaphylactic reaction An anaphylactic reaction is a severe, life-threatening allergy and requires immediate medical attention. The symptoms often include severe allergic reaction symptoms that develop rapidly and lead to: A sudden and sharp drop in blood pressure. Dizziness. Loss of consciousness. How is this diagnosed? This condition is usually diagnosed based on your symptoms and medical history as well as a physical exam. You may have an allergy test to determine if you are allergic to the insect venom. How is this treated? If you were stung by a bee, the stinger and a small sac of venom may be in the wound. Remove the stinger as soon as possible. Do this by brushing across the wound with gauze, a clean fingernail, or a flat card such as a credit card. This can help reduce the severity of your body's reaction to the sting. Normal reactions can be treated with: Washing the area thoroughly  with soap and water. Applying ice to the area to reduce swelling. Oral or topical medicines to help reduce pain and itching, if present. Pay close attention to your symptoms after you have been stung. If possible, have someone stay with you to see if an allergic reaction develops. If allergic symptoms develop, oral antihistamines can be taken and you will need medical help right away. If you had an allergic reaction before, you may need to: Use an auto-injector "pen"(pre-filled automatic epinephrine  injection device)at the first sign of an allergic reaction. Get medical help right away because the epinephrine is short-acting. It is intended to give you more time to get to an emergency room. Follow these instructions at home:  Wash the sting site 2-3 times a day with soap and water as told by your health care provider. Apply or take over-the-counter and prescription medicines only as told by your health care provider. If directed, apply ice to the sting area. Put ice in a plastic bag. Place a towel between your skin and the bag. Leave the ice on for 20 minutes, 2-3 times a day. Do not scratch the sting area. If you had a severe allergic reaction to a sting, you may need to: Wear a medical bracelet or necklace that lists the allergy. Carry an anaphylaxis kit or an epinephrine auto-injector "pen" with you at all times. Tell your family members, friends, and coworkers when and how to use it. Use it at the first sign of an allergic reaction. How is this prevented? Avoid swatting at stinging insects and disturbing insect nests. Do not use fragrant soaps or lotions and avoid sitting near flowering plants, if possible. Wear shoes, pants, and long sleeves when spending time outdoors, especially in grassy areas where stinging insects are common. Keep outdoor areas free from nests or hives. Keep food and drink containers covered when eating outdoors. Wear gloves if you are gardening or working outdoors. Find a barrier between you and the insect(s), such as a door, if an attack by a stinging insect or a swarm seems likely. Contact a health care provider if: Your symptoms do not get better in 2-3 days. You have redness, swelling, or pain that spreads beyond the area of the sting. You have a fever. Get help right away if: You have symptoms of a severe allergic reaction. These include: Chest tightness or pain. Wheezing, or trouble swallowing or breathing. Light-headedness, dizziness, or  fainting. Itchy, raised, red patches on the skin beyond the sting site. Abdominal cramping, nausea, vomiting, or diarrhea. Trouble swallowing or a swollen tongue, throat, or lips. These symptoms may be an emergency. Get help right away. Call 911. Do not wait to see if the symptoms will go away. Do not drive yourself to the hospital. Summary Stings from bees, wasps, and hornets can cause pain and swelling, but they are usually not serious. However, some people may have an allergic reaction to a sting. This can cause the symptoms to be more severe. Pay close attention to your symptoms after you have been stung. If possible, have someone stay with you to look for signs of worsening symptoms. Call your health care provider if you have any signs of an allergic reaction. This information is not intended to replace advice given to you by your health care provider. Make sure you discuss any questions you have with your health care provider. Document Revised: 10/03/2021 Document Reviewed: 10/03/2021 Elsevier Patient Education  [place].

## 2022-06-14 NOTE — Progress Notes (Signed)
Subjective:     Patient ID: Michelle Wilcox, female   DOB: 07/08/1946, 76 y.o.   MRN: [id number]  76y/o married caucasian female established patient here for evaluation left hand pain/swelling s/p unintentional yellow jacket sting while at cafe yesterday.  Only felt one sting thinks stinger still in hand tried to get it out dug around this am but could not can see white line under skin.  Itching a lot and taking benadryl  "I woke up this am scratching the heck out of it"  I put a bandaid on prior to coming to work and that along with benadryl tablet has helped with the itching  Put ice on hand right away at cafe.  Last insect bite needed antibiotics and prednisone foot. Hydrocortisone helped more than calagel on foot for itching.   Doxycycline the only antibiotic I can take--allergic to the others.  Denied tingling/numbness/weakness hand.  Feels hot to touch/red/swollen today.  Right hand dominant.       Review of Systems  Constitutional:  Negative for activity change, appetite change, chills, diaphoresis, fatigue and fever.  HENT:  Negative for trouble swallowing and voice change.   Eyes:  Negative for photophobia, pain, discharge, redness, itching and visual disturbance.  Respiratory:  Negative for cough, choking, chest tightness, shortness of breath, wheezing and stridor.   Cardiovascular:  Negative for chest pain.  Gastrointestinal:  Negative for diarrhea, nausea and vomiting.  Genitourinary:  Negative for difficulty urinating.  Musculoskeletal:  Negative for gait problem.  Skin:  Positive for color change, rash and wound. Negative for pallor.  Allergic/Immunologic: Positive for environmental allergies and food allergies.  Neurological:  Negative for dizziness, tremors, syncope, facial asymmetry, speech difficulty, weakness, light-headedness, numbness and headaches.  Hematological:  Negative for adenopathy. Bruises/bleeds easily.  Psychiatric/Behavioral:  Negative for agitation,  confusion and sleep disturbance.        Objective:   Physical Exam Vitals and nursing note reviewed.  Constitutional:      General: She is awake. She is not in acute distress.    Appearance: Normal appearance. She is well-developed, well-groomed and overweight. She is not ill-appearing, toxic-appearing or diaphoretic.  HENT:     Head: Normocephalic and atraumatic.     Jaw: There is normal jaw occlusion.     Salivary Glands: Right salivary gland is not diffusely enlarged. Left salivary gland is not diffusely enlarged.     Right Ear: Hearing and external ear normal.     Left Ear: Hearing and external ear normal.     Nose: Nose normal. No congestion or rhinorrhea.     Mouth/Throat:     Lips: Pink. No lesions.     Mouth: Mucous membranes are moist.     Pharynx: Oropharynx is clear. Uvula midline. No uvula swelling.  Eyes:     General: Lids are normal. Vision grossly intact. Gaze aligned appropriately. Allergic shiner present. No scleral icterus.       Right eye: No discharge.        Left eye: No discharge.     Extraocular Movements: Extraocular movements intact.     Conjunctiva/sclera: Conjunctivae normal.     Right eye: Right conjunctiva is not injected. No exudate.    Left eye: Left conjunctiva is not injected. No exudate.    Pupils: Pupils are equal, round, and reactive to light.  Neck:     Trachea: Trachea and phonation normal. No tracheal deviation.  Cardiovascular:     Rate and Rhythm: Normal rate and regular  rhythm.     Pulses:          Radial pulses are 2+ on the right side and 2+ on the left side.  Pulmonary:     Effort: Pulmonary effort is normal.     Breath sounds: Normal breath sounds and air entry. No stridor or transmitted upper airway sounds. No wheezing.     Comments: Spoke full sentences without difficulty; no cough observed in exam room Abdominal:     General: Abdomen is flat.  Musculoskeletal:        General: Normal range of motion.     Right elbow: No  swelling, deformity, effusion or lacerations.     Left elbow: No swelling, deformity, effusion or lacerations.     Right forearm: No swelling, edema, deformity, lacerations or tenderness.     Left forearm: No swelling, edema, deformity, lacerations or tenderness.     Right wrist: No swelling, deformity, effusion, lacerations or tenderness.     Left wrist: No swelling, deformity, effusion, lacerations or tenderness.     Right hand: Swelling and tenderness present. No lacerations or bony tenderness. Normal range of motion. Normal strength. Normal capillary refill.     Left hand: No swelling, lacerations, tenderness or bony tenderness. Normal range of motion. Normal strength. Normal capillary refill.     Cervical back: Normal range of motion and neck supple. No swelling, edema, deformity, erythema, signs of trauma, lacerations or rigidity.     Thoracic back: No swelling, edema, deformity, signs of trauma or lacerations.     Right ankle: No swelling.     Left ankle: No swelling.  Lymphadenopathy:     Head:     Right side of head: No submandibular or preauricular adenopathy.     Left side of head: No submandibular or preauricular adenopathy.     Cervical:     Right cervical: No superficial cervical adenopathy.    Left cervical: No superficial cervical adenopathy.  Skin:    General: Skin is warm and dry.     Capillary Refill: Capillary refill takes less than 2 seconds.     Coloration: Skin is not ashen, cyanotic, jaundiced, mottled, pale or sallow.     Findings: Abrasion, erythema, signs of injury, rash and wound present. No abscess, acne, bruising, burn, ecchymosis, laceration or petechiae. Rash is macular. Rash is not crusting, nodular, papular, purpuric, pustular, scaling or vesicular.          Comments: 80% left hand dorsum erythema hot to touch no fluctuance 1+/4 nonpitting edema mild tenderness 1 nummular 26mabrasion under bandaid clean dry beefy red wound bed no discharge/bleeding starting  to scab; white line ?stinger/scar radiating from abrasion 1cm patient stated feels like foreign body with palpation  Neurological:     General: No focal deficit present.     Mental Status: She is alert and oriented to person, place, and time. Mental status is at baseline.     Motor: Motor function is intact. No weakness, tremor, abnormal muscle tone or seizure activity.     Coordination: Coordination is intact. Coordination normal.     Gait: Gait is intact. Gait normal.     Comments: In/out of chair without difficulty; gait sure and steady in clinic; bilateral hand grasp equal 5/5  Psychiatric:        Attention and Perception: Attention and perception normal.        Mood and Affect: Mood and affect normal.        Speech: Speech normal.  Behavior: Behavior normal. Behavior is cooperative.        Thought Content: Thought content normal.        Cognition and Memory: Cognition and memory normal.        Judgment: Judgment normal.       Dorsum left hand 4cm erythema and nonpitting edema 1+/4; abrasion x 1 59m where patient tried to remove stinger from hand  First aid spray and patient verbal consent obtained to remove stinger.  Site cleansed with alcohol wipe and sliver out and scalpal #10 used to remove stinger by NP and RN SDarletta Mollbetween thumb and 1st digit dorsum.  EBL less than 128m  Dressing clean dry and intact on discharge/bandaid.  Patient given clinic stock bandaids/triple antibiotic to apply daily and prn dressing changes x 7 days.  Patient verbalized understanding information/instructions, agreed with plan of care and had no further questions at this time. Assessment:     Insect sting accidental initial encounter and cellulitis hand initial encounter    Plan:     History of insect bites requiring oral steroids. Dispensed Prednisone '10mg'$  taper po with breakfast '30mg'$  x 2 days, '20mg'$  x 2 days, '10mg'$  x 2 days #21 RF0 and doxycycline '100mg'$  po BID x 7 days #20 RF0 from [facility] to  patient start if worsening pain/rash/redness/worsening discharge.   Discussed may sunburn easier while on doxycycline wear protective clothing/sunscreen or limit time in sun/stay in shade/umbrella.   Given calagel 2% and hydrocortisone 1% cream may apply BID prn itching and triple antibiotic topical BID with dressing changes and after washing with soap and water.  Avoid scratching.  May apply ice 15 minutes po TID prn pain/swelling.  Exitcare handouts on skin infection and insect bite printed and given to patient. RTC if worsening erythema, pain, purulent discharge, fever. Wash area with soap and water at least daily.  See RN SuVinnie Levelomorrow if swelling/redness/pain worsening overnight prior to clinic closure at noon.  Apply triple antibiotic daily to BID with dressing change.  Cover with bandage until healed over.  Given bandaids and triple antibiotic ointment from clinic stock.   Patient verbalized understanding, agreed with plan of care and had no further questions at this time.

## 2022-06-15 DIAGNOSIS — M1991 Primary osteoarthritis, unspecified site: Secondary | ICD-10-CM | POA: Insufficient documentation

## 2022-06-18 ENCOUNTER — Telehealth: Payer: Self-pay | Admitting: Registered Nurse

## 2022-06-18 DIAGNOSIS — T63481D Toxic effect of venom of other arthropod, accidental (unintentional), subsequent encounter: Secondary | ICD-10-CM

## 2022-06-18 NOTE — Telephone Encounter (Signed)
NP left message for patient at 1116.  Patient returned call stated "Iced it last night. It felt better. This morning was red and itchy.  After I took the Prednisone it felt better. Got some more Calagel from [facility] this morning. Will try to buy some.  The Cortisone creme irritated it so quit using it. I remembered it bothered me with the fire ants. The brand I use at home does not bother me. Vinnie Level looked at it and said it looks like there may still be something in there but that it should work itself out. I saw all the sheets about Cellulitis that you gave me. Do I have Cellulitis or is that I could get it from this? I will try to buy some antibiotic cream if it is available at the store. The Calagel seems to help the most as far as itching goes."

## 2022-06-20 NOTE — Telephone Encounter (Signed)
Patient came to clinic reported she took doxycycline and prednisone as directed.  Redness and swelling resolved  dorsum hand.  Scab clean dry and intact over previously disrupted skin.  Not TTP no increased temperature compared to other hand.  Some dry/flaky skin noted encouraged patient to apply emollient BID especially after bathing.  Patient reported calagel worked the best for her to relieve itching over the weekend.  Patient denied further questions or concerns and agreed with plan of care.  Encounter closed.

## 2022-06-25 ENCOUNTER — Other Ambulatory Visit: Payer: Self-pay | Admitting: Oncology

## 2022-07-16 DIAGNOSIS — L821 Other seborrheic keratosis: Secondary | ICD-10-CM | POA: Diagnosis not present

## 2022-07-16 DIAGNOSIS — L814 Other melanin hyperpigmentation: Secondary | ICD-10-CM | POA: Diagnosis not present

## 2022-07-16 DIAGNOSIS — D225 Melanocytic nevi of trunk: Secondary | ICD-10-CM | POA: Diagnosis not present

## 2022-07-16 DIAGNOSIS — L304 Erythema intertrigo: Secondary | ICD-10-CM | POA: Diagnosis not present

## 2022-08-27 DIAGNOSIS — E669 Obesity, unspecified: Secondary | ICD-10-CM | POA: Diagnosis not present

## 2022-08-27 DIAGNOSIS — Z79899 Other long term (current) drug therapy: Secondary | ICD-10-CM | POA: Diagnosis not present

## 2022-08-27 DIAGNOSIS — M315 Giant cell arteritis with polymyalgia rheumatica: Secondary | ICD-10-CM | POA: Diagnosis not present

## 2022-08-27 DIAGNOSIS — Z6830 Body mass index (BMI) 30.0-30.9, adult: Secondary | ICD-10-CM | POA: Diagnosis not present

## 2022-08-27 DIAGNOSIS — M1991 Primary osteoarthritis, unspecified site: Secondary | ICD-10-CM | POA: Diagnosis not present

## 2022-08-27 DIAGNOSIS — M353 Polymyalgia rheumatica: Secondary | ICD-10-CM | POA: Diagnosis not present

## 2022-10-08 DIAGNOSIS — E785 Hyperlipidemia, unspecified: Secondary | ICD-10-CM | POA: Diagnosis not present

## 2022-10-15 DIAGNOSIS — Z6832 Body mass index (BMI) 32.0-32.9, adult: Secondary | ICD-10-CM | POA: Diagnosis not present

## 2022-10-15 DIAGNOSIS — E785 Hyperlipidemia, unspecified: Secondary | ICD-10-CM | POA: Diagnosis not present

## 2022-10-15 DIAGNOSIS — K52831 Collagenous colitis: Secondary | ICD-10-CM | POA: Diagnosis not present

## 2022-10-15 DIAGNOSIS — K219 Gastro-esophageal reflux disease without esophagitis: Secondary | ICD-10-CM | POA: Diagnosis not present

## 2022-10-15 DIAGNOSIS — M353 Polymyalgia rheumatica: Secondary | ICD-10-CM | POA: Diagnosis not present

## 2022-10-15 DIAGNOSIS — J45909 Unspecified asthma, uncomplicated: Secondary | ICD-10-CM | POA: Diagnosis not present

## 2022-10-15 DIAGNOSIS — I10 Essential (primary) hypertension: Secondary | ICD-10-CM | POA: Diagnosis not present

## 2022-10-15 DIAGNOSIS — M85852 Other specified disorders of bone density and structure, left thigh: Secondary | ICD-10-CM | POA: Diagnosis not present

## 2022-10-15 DIAGNOSIS — I5032 Chronic diastolic (congestive) heart failure: Secondary | ICD-10-CM | POA: Diagnosis not present

## 2022-10-15 DIAGNOSIS — F439 Reaction to severe stress, unspecified: Secondary | ICD-10-CM | POA: Diagnosis not present

## 2022-10-16 ENCOUNTER — Other Ambulatory Visit: Payer: Self-pay | Admitting: Family Medicine

## 2022-10-16 DIAGNOSIS — M85852 Other specified disorders of bone density and structure, left thigh: Secondary | ICD-10-CM

## 2022-11-05 DIAGNOSIS — I10 Essential (primary) hypertension: Secondary | ICD-10-CM | POA: Diagnosis not present

## 2022-11-05 DIAGNOSIS — E785 Hyperlipidemia, unspecified: Secondary | ICD-10-CM | POA: Diagnosis not present

## 2022-11-05 DIAGNOSIS — J45909 Unspecified asthma, uncomplicated: Secondary | ICD-10-CM | POA: Diagnosis not present

## 2022-11-05 DIAGNOSIS — I5032 Chronic diastolic (congestive) heart failure: Secondary | ICD-10-CM | POA: Diagnosis not present

## 2022-11-26 DIAGNOSIS — J45909 Unspecified asthma, uncomplicated: Secondary | ICD-10-CM | POA: Diagnosis not present

## 2022-11-26 DIAGNOSIS — Z6832 Body mass index (BMI) 32.0-32.9, adult: Secondary | ICD-10-CM | POA: Diagnosis not present

## 2023-02-09 ENCOUNTER — Other Ambulatory Visit: Payer: Self-pay

## 2023-02-09 ENCOUNTER — Emergency Department (HOSPITAL_COMMUNITY): Payer: Medicare Other

## 2023-02-09 ENCOUNTER — Encounter (HOSPITAL_COMMUNITY): Payer: Self-pay

## 2023-02-09 ENCOUNTER — Emergency Department (HOSPITAL_COMMUNITY)
Admission: EM | Admit: 2023-02-09 | Discharge: 2023-02-09 | Disposition: A | Discharge status: Home / Self Care | Payer: Medicare Other | Attending: Emergency Medicine | Admitting: Emergency Medicine

## 2023-02-09 DIAGNOSIS — I700 Atherosclerosis of aorta: Secondary | ICD-10-CM | POA: Diagnosis not present

## 2023-02-09 DIAGNOSIS — R072 Precordial pain: Secondary | ICD-10-CM | POA: Diagnosis not present

## 2023-02-09 DIAGNOSIS — S50311A Abrasion of right elbow, initial encounter: Secondary | ICD-10-CM | POA: Diagnosis not present

## 2023-02-09 DIAGNOSIS — M25532 Pain in left wrist: Secondary | ICD-10-CM | POA: Diagnosis not present

## 2023-02-09 DIAGNOSIS — W010XXA Fall on same level from slipping, tripping and stumbling without subsequent striking against object, initial encounter: Secondary | ICD-10-CM | POA: Insufficient documentation

## 2023-02-09 DIAGNOSIS — M47816 Spondylosis without myelopathy or radiculopathy, lumbar region: Secondary | ICD-10-CM | POA: Diagnosis not present

## 2023-02-09 DIAGNOSIS — S0181XA Laceration without foreign body of other part of head, initial encounter: Secondary | ICD-10-CM | POA: Insufficient documentation

## 2023-02-09 DIAGNOSIS — I509 Heart failure, unspecified: Secondary | ICD-10-CM | POA: Insufficient documentation

## 2023-02-09 DIAGNOSIS — M545 Low back pain, unspecified: Secondary | ICD-10-CM | POA: Insufficient documentation

## 2023-02-09 DIAGNOSIS — Y990 Civilian activity done for income or pay: Secondary | ICD-10-CM | POA: Insufficient documentation

## 2023-02-09 DIAGNOSIS — S0990XA Unspecified injury of head, initial encounter: Secondary | ICD-10-CM | POA: Diagnosis not present

## 2023-02-09 DIAGNOSIS — M1812 Unilateral primary osteoarthritis of first carpometacarpal joint, left hand: Secondary | ICD-10-CM | POA: Diagnosis not present

## 2023-02-09 DIAGNOSIS — S299XXA Unspecified injury of thorax, initial encounter: Secondary | ICD-10-CM | POA: Diagnosis not present

## 2023-02-09 DIAGNOSIS — S199XXA Unspecified injury of neck, initial encounter: Secondary | ICD-10-CM | POA: Diagnosis not present

## 2023-02-09 DIAGNOSIS — R0689 Other abnormalities of breathing: Secondary | ICD-10-CM | POA: Diagnosis not present

## 2023-02-09 DIAGNOSIS — W19XXXA Unspecified fall, initial encounter: Secondary | ICD-10-CM

## 2023-02-09 DIAGNOSIS — I110 Hypertensive heart disease with heart failure: Secondary | ICD-10-CM | POA: Insufficient documentation

## 2023-02-09 DIAGNOSIS — Z043 Encounter for examination and observation following other accident: Secondary | ICD-10-CM | POA: Diagnosis not present

## 2023-02-09 DIAGNOSIS — R58 Hemorrhage, not elsewhere classified: Secondary | ICD-10-CM | POA: Diagnosis not present

## 2023-02-09 DIAGNOSIS — T148XXA Other injury of unspecified body region, initial encounter: Secondary | ICD-10-CM

## 2023-02-09 DIAGNOSIS — S0511XA Contusion of eyeball and orbital tissues, right eye, initial encounter: Secondary | ICD-10-CM | POA: Diagnosis not present

## 2023-02-09 DIAGNOSIS — M549 Dorsalgia, unspecified: Secondary | ICD-10-CM | POA: Diagnosis not present

## 2023-02-09 DIAGNOSIS — S0993XA Unspecified injury of face, initial encounter: Secondary | ICD-10-CM | POA: Diagnosis not present

## 2023-02-09 DIAGNOSIS — M19041 Primary osteoarthritis, right hand: Secondary | ICD-10-CM | POA: Diagnosis not present

## 2023-02-09 LAB — CBC WITH DIFFERENTIAL/PLATELET
Abs Immature Granulocytes: 0.07 10*3/uL (ref 0.00–0.07)
Basophils Absolute: 0.1 10*3/uL (ref 0.0–0.1)
Basophils Relative: 1 %
Eosinophils Absolute: 0.2 10*3/uL (ref 0.0–0.5)
Eosinophils Relative: 2 %
HCT: 42.3 % (ref 36.0–46.0)
Hemoglobin: 14.1 g/dL (ref 12.0–15.0)
Immature Granulocytes: 1 %
Lymphocytes Relative: 19 %
Lymphs Abs: 2.9 10*3/uL (ref 0.7–4.0)
MCH: 32.1 pg (ref 26.0–34.0)
MCHC: 33.3 g/dL (ref 30.0–36.0)
MCV: 96.4 fL (ref 80.0–100.0)
Monocytes Absolute: 1 10*3/uL (ref 0.1–1.0)
Monocytes Relative: 7 %
Neutro Abs: 10.4 10*3/uL — ABNORMAL HIGH (ref 1.7–7.7)
Neutrophils Relative %: 70 %
Platelets: 365 10*3/uL (ref 150–400)
RBC: 4.39 MIL/uL (ref 3.87–5.11)
RDW: 14.5 % (ref 11.5–15.5)
WBC: 14.7 10*3/uL — ABNORMAL HIGH (ref 4.0–10.5)
nRBC: 0 % (ref 0.0–0.2)

## 2023-02-09 LAB — BASIC METABOLIC PANEL
Anion gap: 15 (ref 5–15)
BUN: 18 mg/dL (ref 8–23)
CO2: 28 mmol/L (ref 22–32)
Calcium: 9.3 mg/dL (ref 8.9–10.3)
Chloride: 99 mmol/L (ref 98–111)
Creatinine, Ser: 0.78 mg/dL (ref 0.44–1.00)
GFR, Estimated: >60 mL/min (ref >60)
Glucose, Bld: 90 mg/dL (ref 70–99)
Potassium: 4.4 mmol/L (ref 3.5–5.1)
Sodium: 142 mmol/L (ref 135–145)

## 2023-02-09 MED ORDER — IOHEXOL 350 MG/ML SOLN
75.0000 mL | Freq: Once | INTRAVENOUS | Status: AC | PRN
  Administered 2023-02-09: 75 mL via INTRAVENOUS

## 2023-02-09 MED ORDER — FENTANYL CITRATE PF 50 MCG/ML IJ SOSY
25.0000 ug | PREFILLED_SYRINGE | Freq: Once | INTRAMUSCULAR | Status: AC
  Administered 2023-02-09: 25 ug via INTRAVENOUS
  Filled 2023-02-09: qty 1

## 2023-02-09 MED ORDER — ONDANSETRON HCL 4 MG/2ML IJ SOLN
4.0000 mg | Freq: Once | INTRAMUSCULAR | Status: AC
  Administered 2023-02-09: 4 mg via INTRAVENOUS
  Filled 2023-02-09: qty 2

## 2023-02-09 NOTE — ED Notes (Signed)
Patient transported to CT 

## 2023-02-09 NOTE — ED Triage Notes (Signed)
Pt BIB [facility] from work were she slipped on the floor and fell forward. Pt is not on a blood thinner. Pt is c/o right elbow pain, lower back pain, left wrist pain and facial pain with bruising and some small abrasions on the forehead.

## 2023-02-09 NOTE — ED Provider Notes (Signed)
[place] EMERGENCY DEPARTMENT AT [facility] Provider Note   CSN: [id number] Arrival date & time: 02/09/23  1327     History  Chief Complaint  Patient presents with   Michelle Wilcox is a 77 y.o. female.  Patient brought in by EMS following fall at work were she slipped on the floor and fell forward. Patient is not on blood thinners. Patient with small laceration to forehead. Bruising and abrasions to face. Right wrist pain. Left wrist pain. Right elbow pain. Low back pain but patient states the back pain is chronic and not new or worse. Vital signs significant for not hypotensive and oxygen sats mid 90's.  Past medical history significant for HTN, CHF, Hyperlipidemia. Patient never used tobacco products.       Home Medications Prior to Admission medications   Medication Sig Start Date End Date Taking? Authorizing Provider  acetaminophen (TYLENOL) 650 MG CR tablet Take 1,300 mg by mouth 2 (two) times daily.    [provider]  ALPRAZolam Prudy Feeler) 0.25 MG tablet Take 0.25 mg by mouth 2 (two) times daily as needed. 04/16/22   [provider]  diphenhydrAMINE (BENADRYL) 25 MG tablet Take 25 mg by mouth 4 (four) times daily as needed. 01/06/22   [provider]  folic acid (FOLVITE) 1 MG tablet TAKE 1 TABLET(1 MG) BY MOUTH DAILY 03/20/22   Ladene Artist, MD  furosemide (LASIX) 40 MG tablet Take 20-40 mg by mouth daily.    [provider]  lactase (LACTAID) 3000 units tablet Take 6,000-9,000 Units by mouth as needed (consuming dairy products).    [provider]  loperamide (IMODIUM A-D) 2 MG tablet Take 2 mg by mouth as needed for diarrhea or loose stools (takes 2 if needed).    [provider]  loratadine (CLARITIN) 10 MG tablet Take 10 mg by mouth daily.    [provider]  metoprolol tartrate (LOPRESSOR) 50 MG tablet Take 25-50 mg by mouth See admin instructions. Take 1 tablet (50 mg total) by  mouth every morning AND take 0.5 tablet (25 mg total) by mouth every evening.    [provider]  Multiple Vitamin (MULTIVITAMIN) tablet Take 1 tablet by mouth every evening.     [provider]  potassium chloride SA (K-DUR,KLOR-CON) 20 MEQ tablet Take 10-20 mEq by mouth daily.    [provider]  predniSONE (DELTASONE) 5 MG tablet TAKE 1 TABLET(5 MG) BY MOUTH DAILY WITH BREAKFAST 06/25/22   Ladene Artist, MD  Probiotic Product (ALIGN) 4 MG CAPS Take 4 mg by mouth every morning.    [provider]  rosuvastatin (CRESTOR) 5 MG tablet Take 1 tablet by mouth every other day.    [provider]  simethicone (MYLICON) 80 MG chewable tablet Chew 80 mg by mouth every 6 (six) hours as needed for flatulence.     [provider]  VENTOLIN HFA 108 (90 Base) MCG/ACT inhaler Take 1-2 puffs by mouth every 4 (four) hours as needed for shortness of breath. 06/24/17   [provider]  Vitamin D3 (VITAMIN D) 25 MCG tablet Take 1,000 Units by mouth every other day.    [provider]      Allergies    Penicillins, Almond (diagnostic), Ceftin [cefuroxime axetil], Ciprofloxacin, Darvocet [propoxyphene n-acetaminophen], Lactose intolerance (gi), Levaquin [levofloxacin in d5w], Sulfa antibiotics, Sulfamethoxazole-trimethoprim, Zyrtec [cetirizine hcl], and Other    Review of Systems   Review of Systems  Constitutional:  Negative for chills and fever.  HENT:  Negative for ear pain and sore throat.   Eyes:  Negative for pain and visual disturbance.  Respiratory:  Negative for cough and shortness of breath.   Cardiovascular:  Positive for chest pain. Negative for palpitations.  Gastrointestinal:  Negative for abdominal pain and vomiting.  Genitourinary:  Negative for dysuria and hematuria.  Musculoskeletal:  Positive for back pain, joint swelling and neck pain. Negative for arthralgias.  Skin:  Positive for wound. Negative for color change and  rash.  Neurological:  Positive for headaches. Negative for seizures and syncope.  All other systems reviewed and are negative.   Physical Exam Updated Vital Signs BP 139/76   Pulse 94   Temp 98.9 F (37.2 C) (Oral)   Resp 16   Ht 1.626 m (5\' 4" )   Wt 83.9 kg   SpO2 93%   BMI 31.76 kg/m  Physical Exam Vitals and nursing note reviewed.  Constitutional:      General: She is not in acute distress.    Appearance: She is well-developed.  HENT:     Head: Normocephalic.     Comments: Bruising to face around eyes. Right forehead with small laceration not actively bleeding measuring about 1 cm.     Mouth/Throat:     Mouth: Mucous membranes are moist.     Pharynx: Oropharynx is clear.  Eyes:     Extraocular Movements: Extraocular movements intact.     Conjunctiva/sclera: Conjunctivae normal.     Pupils: Pupils are equal, round, and reactive to light.     Comments: No hyphema  Neck:     Comments: ? Posterior tenderness Cardiovascular:     Rate and Rhythm: Normal rate and regular rhythm.     Heart sounds: No murmur heard. Pulmonary:     Effort: Pulmonary effort is normal. No respiratory distress.     Breath sounds: Normal breath sounds. No stridor. No wheezing, rhonchi or rales.     Comments: Upper sternal tenderness and selling but no bruising. Noted later. Chest:     Chest wall: Tenderness present.  Abdominal:     Palpations: Abdomen is soft.     Tenderness: There is no abdominal tenderness.  Musculoskeletal:        General: Tenderness present. No swelling.     Cervical back: Neck supple. Tenderness present.     Comments: No Lumbar back tenderness. Bilateral wrist with bruising, swelling and some tenderness but no snuff box tenderness. Also right elbow abrasion and tenderness but no deformity. Good radial and DP pulses bilaterally.  Skin:    General: Skin is warm and dry.     Capillary Refill: Capillary refill takes less than 2 seconds.  Neurological:     General: No focal  deficit present.     Mental Status: She is alert and oriented to person, place, and time.  Psychiatric:        Mood and Affect: Mood normal.     ED Results / Procedures / Treatments   Labs (all labs ordered are listed, but only abnormal results are displayed) Labs Reviewed  CBC WITH DIFFERENTIAL/PLATELET - Abnormal; Notable for the following components:      Result Value   WBC 14.7 (*)    Neutro Abs 10.4 (*)    All other components within normal limits  BASIC METABOLIC PANEL    EKG None  Radiology CT Chest W Contrast  Result Date: 02/09/2023 CLINICAL DATA:  Blunt chest Trauma, fell  from standing EXAM: CT CHEST WITH CONTRAST TECHNIQUE: Multidetector CT imaging of the chest was performed during intravenous contrast administration. RADIATION DOSE REDUCTION: This exam was performed according to the departmental dose-optimization program which includes automated exposure control, adjustment of the mA and/or kV according to patient size and/or use of iterative reconstruction technique. CONTRAST:  75mL OMNIPAQUE IOHEXOL 350 MG/ML SOLN COMPARISON:  08/19/2007 FINDINGS: Cardiovascular: Left atrial dilatation without cardiomegaly. No pericardial effusion. Minimal calcification of the mitral and aortic valves. Atherosclerosis of the coronary vasculature. No evidence of thoracic aortic aneurysm or dissection. Atherosclerosis of the aortic arch. Mediastinum/Nodes: No enlarged mediastinal, hilar, or axillary lymph nodes. Thyroid gland, trachea, and esophagus demonstrate no significant findings. Lungs/Pleura: No acute airspace disease, effusion, or pneumothorax. Bibasilar scarring, most pronounced in the left lower lobe. Central airways are patent. Upper Abdomen: Small calcified gallstones without cholecystitis. Otherwise no acute upper abdominal findings. Prior splenectomy. Musculoskeletal: No acute or destructive bony abnormalities. Reconstructed images demonstrate no additional findings. IMPRESSION: 1.  No acute intrathoracic trauma. 2. No acute airspace disease. 3. Aortic Atherosclerosis (ICD10-I70.0). Coronary artery atherosclerosis. 4. Cholelithiasis without evidence of cholecystitis. Electronically Signed   By: Sharlet Salina M.D.   On: 02/09/2023 19:58   CT Head Wo Contrast  Result Date: 02/09/2023 CLINICAL DATA:  Head trauma, minor (Age >= 65y); Polytrauma, blunt; Facial trauma, blunt EXAM: CT HEAD WITHOUT CONTRAST CT MAXILLOFACIAL WITHOUT CONTRAST CT CERVICAL SPINE WITHOUT CONTRAST TECHNIQUE: Multidetector CT imaging of the head, cervical spine, and maxillofacial structures were performed using the standard protocol without intravenous contrast. Multiplanar CT image reconstructions of the cervical spine and maxillofacial structures were also generated. RADIATION DOSE REDUCTION: This exam was performed according to the departmental dose-optimization program which includes automated exposure control, adjustment of the mA and/or kV according to patient size and/or use of iterative reconstruction technique. COMPARISON:  None Available. FINDINGS: CT HEAD FINDINGS Brain: Trace patchy and confluent areas of decreased attenuation are noted throughout the deep and periventricular white matter of the cerebral hemispheres bilaterally, compatible with chronic microvascular ischemic disease. No evidence of large-territorial acute infarction. No parenchymal hemorrhage. No mass lesion. No extra-axial collection. No mass effect or midline shift. No hydrocephalus. Basilar cisterns are patent. Vascular: No hyperdense vessel. Skull: No acute fracture or focal lesion. Other: Trace midline frontal scalp hematoma formation. CT MAXILLOFACIAL FINDINGS Osseous: No fracture or mandibular dislocation. No destructive process. Sinuses/Orbits: Almost complete opacification of the right maxillary sinus. Mucosal thickening of the right frontal and right ethmoid sinuses. Paranasal sinuses and mastoid air cells are clear. The orbits are  unremarkable. Soft tissues: Right periorbital small hematoma formation. CT CERVICAL SPINE FINDINGS Alignment: Reversal of the normal cervical lordosis centered at the C4-C5 level likely due to positioning and degenerative changes. Skull base and vertebrae: Multilevel degenerative changes of the spine centered at the C4 - C6 levels. Associated severe osseous neural foraminal stenosis at the C5-C6 level. No acute fracture. No aggressive appearing focal osseous lesion or focal pathologic process. Soft tissues and spinal canal: No prevertebral fluid or swelling. No visible canal hematoma. Upper chest: Unremarkable. Other: Atherosclerotic plaque of the aorta and its main branches. IMPRESSION: 1. No acute intracranial abnormality. 2.  No acute displaced facial fracture. 3. No acute displaced fracture or traumatic listhesis of the cervical spine. 4. Multilevel degenerative changes of the spine centered at the C4 - C6 levels. Associated severe osseous neural foraminal stenosis at the C5-C6 level. 5.  Aortic Atherosclerosis (ICD10-I70.0). Electronically Signed   By: Normajean Glasgow.D.  On: 02/09/2023 17:37   CT Cervical Spine Wo Contrast  Result Date: 02/09/2023 CLINICAL DATA:  Head trauma, minor (Age >= 65y); Polytrauma, blunt; Facial trauma, blunt EXAM: CT HEAD WITHOUT CONTRAST CT MAXILLOFACIAL WITHOUT CONTRAST CT CERVICAL SPINE WITHOUT CONTRAST TECHNIQUE: Multidetector CT imaging of the head, cervical spine, and maxillofacial structures were performed using the standard protocol without intravenous contrast. Multiplanar CT image reconstructions of the cervical spine and maxillofacial structures were also generated. RADIATION DOSE REDUCTION: This exam was performed according to the departmental dose-optimization program which includes automated exposure control, adjustment of the mA and/or kV according to patient size and/or use of iterative reconstruction technique. COMPARISON:  None Available. FINDINGS: CT HEAD  FINDINGS Brain: Trace patchy and confluent areas of decreased attenuation are noted throughout the deep and periventricular white matter of the cerebral hemispheres bilaterally, compatible with chronic microvascular ischemic disease. No evidence of large-territorial acute infarction. No parenchymal hemorrhage. No mass lesion. No extra-axial collection. No mass effect or midline shift. No hydrocephalus. Basilar cisterns are patent. Vascular: No hyperdense vessel. Skull: No acute fracture or focal lesion. Other: Trace midline frontal scalp hematoma formation. CT MAXILLOFACIAL FINDINGS Osseous: No fracture or mandibular dislocation. No destructive process. Sinuses/Orbits: Almost complete opacification of the right maxillary sinus. Mucosal thickening of the right frontal and right ethmoid sinuses. Paranasal sinuses and mastoid air cells are clear. The orbits are unremarkable. Soft tissues: Right periorbital small hematoma formation. CT CERVICAL SPINE FINDINGS Alignment: Reversal of the normal cervical lordosis centered at the C4-C5 level likely due to positioning and degenerative changes. Skull base and vertebrae: Multilevel degenerative changes of the spine centered at the C4 - C6 levels. Associated severe osseous neural foraminal stenosis at the C5-C6 level. No acute fracture. No aggressive appearing focal osseous lesion or focal pathologic process. Soft tissues and spinal canal: No prevertebral fluid or swelling. No visible canal hematoma. Upper chest: Unremarkable. Other: Atherosclerotic plaque of the aorta and its main branches. IMPRESSION: 1. No acute intracranial abnormality. 2.  No acute displaced facial fracture. 3. No acute displaced fracture or traumatic listhesis of the cervical spine. 4. Multilevel degenerative changes of the spine centered at the C4 - C6 levels. Associated severe osseous neural foraminal stenosis at the C5-C6 level. 5.  Aortic Atherosclerosis (ICD10-I70.0). Electronically Signed   By:  Tish Frederickson M.D.   On: 02/09/2023 17:37   CT Maxillofacial WO CM  Result Date: 02/09/2023 CLINICAL DATA:  Head trauma, minor (Age >= 65y); Polytrauma, blunt; Facial trauma, blunt EXAM: CT HEAD WITHOUT CONTRAST CT MAXILLOFACIAL WITHOUT CONTRAST CT CERVICAL SPINE WITHOUT CONTRAST TECHNIQUE: Multidetector CT imaging of the head, cervical spine, and maxillofacial structures were performed using the standard protocol without intravenous contrast. Multiplanar CT image reconstructions of the cervical spine and maxillofacial structures were also generated. RADIATION DOSE REDUCTION: This exam was performed according to the departmental dose-optimization program which includes automated exposure control, adjustment of the mA and/or kV according to patient size and/or use of iterative reconstruction technique. COMPARISON:  None Available. FINDINGS: CT HEAD FINDINGS Brain: Trace patchy and confluent areas of decreased attenuation are noted throughout the deep and periventricular white matter of the cerebral hemispheres bilaterally, compatible with chronic microvascular ischemic disease. No evidence of large-territorial acute infarction. No parenchymal hemorrhage. No mass lesion. No extra-axial collection. No mass effect or midline shift. No hydrocephalus. Basilar cisterns are patent. Vascular: No hyperdense vessel. Skull: No acute fracture or focal lesion. Other: Trace midline frontal scalp hematoma formation. CT MAXILLOFACIAL FINDINGS Osseous: No fracture or mandibular  dislocation. No destructive process. Sinuses/Orbits: Almost complete opacification of the right maxillary sinus. Mucosal thickening of the right frontal and right ethmoid sinuses. Paranasal sinuses and mastoid air cells are clear. The orbits are unremarkable. Soft tissues: Right periorbital small hematoma formation. CT CERVICAL SPINE FINDINGS Alignment: Reversal of the normal cervical lordosis centered at the C4-C5 level likely due to positioning and  degenerative changes. Skull base and vertebrae: Multilevel degenerative changes of the spine centered at the C4 - C6 levels. Associated severe osseous neural foraminal stenosis at the C5-C6 level. No acute fracture. No aggressive appearing focal osseous lesion or focal pathologic process. Soft tissues and spinal canal: No prevertebral fluid or swelling. No visible canal hematoma. Upper chest: Unremarkable. Other: Atherosclerotic plaque of the aorta and its main branches. IMPRESSION: 1. No acute intracranial abnormality. 2.  No acute displaced facial fracture. 3. No acute displaced fracture or traumatic listhesis of the cervical spine. 4. Multilevel degenerative changes of the spine centered at the C4 - C6 levels. Associated severe osseous neural foraminal stenosis at the C5-C6 level. 5.  Aortic Atherosclerosis (ICD10-I70.0). Electronically Signed   By: Tish Frederickson M.D.   On: 02/09/2023 17:37   DG Lumbar Spine Complete  Result Date: 02/09/2023 CLINICAL DATA:  Larey Seat from standing EXAM: LUMBAR SPINE - COMPLETE 4+ VIEW COMPARISON:  05/01/2010 FINDINGS: Frontal, bilateral oblique, lateral views of the lumbar spine are obtained. There are 5 non-rib-bearing lumbar type vertebral bodies identified with right convex scoliosis centered at the L2-3 level. Otherwise alignment is anatomic. No acute displaced fracture. Extensive multilevel spondylosis and facet hypertrophy, most pronounced at L2-3 and L5-S1. Sacroiliac joints are unremarkable. Visualized portions of the bony pelvis are unremarkable. IMPRESSION: 1. Multilevel lumbar spondylosis and facet hypertrophy, with right convex scoliosis centered at L2-3. 2. No acute displaced fracture. Electronically Signed   By: Sharlet Salina M.D.   On: 02/09/2023 17:30   DG Elbow Complete Right  Result Date: 02/09/2023 CLINICAL DATA:  Larey Seat from standing EXAM: RIGHT ELBOW - COMPLETE 3+ VIEW COMPARISON:  None Available. FINDINGS: Frontal, bilateral oblique, and lateral views of  the right elbow are obtained. No acute fracture, subluxation, or dislocation. Joint spaces are well preserved. No joint effusion. Soft tissues are unremarkable. IMPRESSION: 1. Unremarkable right elbow. Electronically Signed   By: Sharlet Salina M.D.   On: 02/09/2023 17:28   DG Wrist Complete Left  Result Date: 02/09/2023 CLINICAL DATA:  Larey Seat from standing EXAM: LEFT WRIST - COMPLETE 3+ VIEW COMPARISON:  None Available. FINDINGS: Frontal, oblique, lateral, and ulnar deviated views of the left wrist are obtained. No acute displaced fractures. Severe osteoarthritis within the radial aspect of the carpus, greatest at the first carpometacarpal joint. Soft tissues are unremarkable. IMPRESSION: 1. Severe osteoarthritis.  No acute displaced fracture. Electronically Signed   By: Sharlet Salina M.D.   On: 02/09/2023 17:26   DG Wrist Complete Right  Result Date: 02/09/2023 CLINICAL DATA:  Larey Seat from standing EXAM: RIGHT WRIST - COMPLETE 3+ VIEW COMPARISON:  None Available. FINDINGS: Frontal, oblique, lateral, and ulnar deviated views of the right wrist are obtained. There are no acute displaced fractures. Severe osteoarthritis is seen within the radial aspect of the carpus, most pronounced at the first and second carpometacarpal joints. Soft tissues are unremarkable. IMPRESSION: 1. Severe osteoarthritis.  No acute displaced fracture. Electronically Signed   By: Sharlet Salina M.D.   On: 02/09/2023 17:25    Procedures .Marland KitchenLaceration Repair  Date/Time: 02/16/2023 2:52 PM  Performed by: Vanetta Mulders, MD Authorized  by: Vanetta Mulders, MD   Consent:    Consent obtained:  Verbal   Consent given by:  Patient   Risks discussed:  Pain and infection   Alternatives discussed:  No treatment Universal protocol:    Procedure explained and questions answered to patient or proxy's satisfaction: yes     Relevant documents present and verified: yes     Test results available: yes     Imaging studies available: yes      Site/side marked: no     Immediately prior to procedure, a time out was called: yes     Patient identity confirmed:  Verbally with patient Anesthesia:    Anesthesia method:  None Laceration details:    Location:  Face   Face location:  Forehead   Length (cm):  1 Pre-procedure details:    Preparation:  Imaging obtained to evaluate for foreign bodies Exploration:    Limited defect created (wound extended): no     Hemostasis achieved with:  Direct pressure   Contaminated: no   Treatment:    Area cleansed with:  Shur-Clens   Visualized foreign bodies/material removed: no     Debridement:  None   Undermining:  None   Scar revision: no   Skin repair:    Repair method:  Tissue adhesive Approximation:    Approximation:  Close Repair type:    Repair type:  Simple Post-procedure details:    Dressing:  Open (no dressing)   Procedure completion:  Tolerated well, no immediate complications     Medications Ordered in ED Medications  ondansetron (ZOFRAN) injection 4 mg (4 mg Intravenous Given 02/09/23 1933)  fentaNYL (SUBLIMAZE) injection 25 mcg (25 mcg Intravenous Given 02/09/23 1934)  iohexol (OMNIPAQUE) 350 MG/ML injection 75 mL (75 mLs Intravenous Contrast Given 02/09/23 1952)    ED Course/ Medical Decision Making/ A&P                             Medical Decision Making Amount and/or Complexity of Data Reviewed Labs: ordered. Radiology: ordered.  Risk Prescription drug management.  Ct head,  neck, face without any significant acute injuries. Xrays of both wrist and elbow without acute boney injury.   Right forehad 1 cm wound glued with dermabond.   After coming back from radiology patient raised concern for upper sternal bump and pain. Due to fall patient was sent back for CT chest which showed no acute findings.   Patient stable for discharge home.   Final Clinical Impression(s) / ED Diagnoses Final diagnoses:  Fall, initial encounter  Injury of head, initial  encounter  Laceration of forehead, initial encounter  Abrasion    Rx / DC Orders ED Discharge Orders     None         Vanetta Mulders, MD 02/16/23 1454

## 2023-02-09 NOTE — Discharge Instructions (Addendum)
CT head neck and face without any acute abnormalities.  The wound on your forehead has been glued.  For the skin tears at the elbow and wrist area antibiotic ointment and dressing.  Work note provided to be out of work.  X-rays of your lumbar back both wrist and elbow were negative for any bony injuries.  And CT scan of the chest negative for any acute injuries.  Expect a be very sore over the next few days.  Recommend extra strength Tylenol for the soreness.  Take 2 tablets every 8 hours.  Make an appointment to follow-up with your primary care doctor.  Expect a lot of bruising to the face to last for several weeks.  Return for any new or worse symptoms

## 2023-02-13 ENCOUNTER — Other Ambulatory Visit: Payer: Self-pay | Admitting: Nurse Practitioner

## 2023-02-13 ENCOUNTER — Ambulatory Visit: Payer: Self-pay

## 2023-02-13 DIAGNOSIS — R0781 Pleurodynia: Secondary | ICD-10-CM

## 2023-03-06 DIAGNOSIS — W19XXXA Unspecified fall, initial encounter: Secondary | ICD-10-CM | POA: Diagnosis not present

## 2023-03-06 DIAGNOSIS — S0993XA Unspecified injury of face, initial encounter: Secondary | ICD-10-CM | POA: Diagnosis not present

## 2023-03-12 ENCOUNTER — Telehealth: Payer: Self-pay | Admitting: Registered Nurse

## 2023-03-12 ENCOUNTER — Encounter: Payer: Self-pay | Admitting: Registered Nurse

## 2023-03-12 DIAGNOSIS — W19XXXD Unspecified fall, subsequent encounter: Secondary | ICD-10-CM

## 2023-03-13 ENCOUNTER — Other Ambulatory Visit: Payer: Self-pay | Admitting: Oncology

## 2023-03-13 DIAGNOSIS — D591 Autoimmune hemolytic anemia, unspecified: Secondary | ICD-10-CM

## 2023-03-14 NOTE — Telephone Encounter (Signed)
Message left for patient [facility] adjuster Brandon per patient request 03/14/23 at [phone] to discuss what offices he had tried to accept patient wc insurance for treatment.

## 2023-03-14 NOTE — Telephone Encounter (Signed)
Received call from adjuster patient has appt scheduled next week with [facility] provider Dr Adela Glimpse that she was able to schedule in person provider appt.  No further assistance required at this time.  HR Tonya notified.

## 2023-03-14 NOTE — Telephone Encounter (Signed)
Spoke with HR Tonya and she stated patient was offered face to face provider in [facility] but patient felt too far to drive but only other providers available virtual appts which she refused.

## 2023-03-14 NOTE — Telephone Encounter (Signed)
Patient contacted NP stated unable to return to work s/p fall until she completes PTSD therapy.  Unable to find provider that accepts worker's comp insurance in local area.  She wants face to face not virtual visits.  She wants to return to work but unable to do so until these appts completed.  Her POC at [facility] [phone].  Discussed with patient I would contact him per her request to see if I know of any other providers in local area after I discuss with him what offices he contacted.  Discussed with patient [facility] behavior health through work but she stated she has medicare not medcost through work and unable to utilize.  Discussed could use her medicare insurance for provider if truly unable to find provider that accepts the [employer].  Discussed mental health providers typically have long wait for appts and if she is wanting to return to work as soon as possible consider virtual appts to start with available providers like [facility].  Patient given her contact information as visits free.  Patient stated she would contact [facility] and follow up with Apolinar Junes.  I stated I would contact Apolinar Junes also to see if I could assist in any way.  Patient verbalized understanding information/instructions and had no further questions at that time.

## 2023-03-26 DIAGNOSIS — F4310 Post-traumatic stress disorder, unspecified: Secondary | ICD-10-CM | POA: Diagnosis not present

## 2023-04-01 DIAGNOSIS — M85852 Other specified disorders of bone density and structure, left thigh: Secondary | ICD-10-CM | POA: Diagnosis not present

## 2023-04-01 DIAGNOSIS — I10 Essential (primary) hypertension: Secondary | ICD-10-CM | POA: Diagnosis not present

## 2023-04-01 DIAGNOSIS — M8588 Other specified disorders of bone density and structure, other site: Secondary | ICD-10-CM | POA: Diagnosis not present

## 2023-04-01 DIAGNOSIS — E785 Hyperlipidemia, unspecified: Secondary | ICD-10-CM | POA: Diagnosis not present

## 2023-04-03 DIAGNOSIS — F4310 Post-traumatic stress disorder, unspecified: Secondary | ICD-10-CM | POA: Diagnosis not present

## 2023-04-08 ENCOUNTER — Ambulatory Visit
Admission: RE | Admit: 2023-04-08 | Discharge: 2023-04-08 | Disposition: A | Discharge status: Home / Self Care | Payer: Medicare Other | Source: Ambulatory Visit | Attending: Family Medicine | Admitting: Family Medicine

## 2023-04-08 DIAGNOSIS — M353 Polymyalgia rheumatica: Secondary | ICD-10-CM | POA: Diagnosis not present

## 2023-04-08 DIAGNOSIS — K219 Gastro-esophageal reflux disease without esophagitis: Secondary | ICD-10-CM | POA: Diagnosis not present

## 2023-04-08 DIAGNOSIS — M85852 Other specified disorders of bone density and structure, left thigh: Secondary | ICD-10-CM

## 2023-04-08 DIAGNOSIS — E349 Endocrine disorder, unspecified: Secondary | ICD-10-CM | POA: Diagnosis not present

## 2023-04-08 DIAGNOSIS — K52831 Collagenous colitis: Secondary | ICD-10-CM | POA: Diagnosis not present

## 2023-04-08 DIAGNOSIS — Z6832 Body mass index (BMI) 32.0-32.9, adult: Secondary | ICD-10-CM | POA: Diagnosis not present

## 2023-04-08 DIAGNOSIS — I10 Essential (primary) hypertension: Secondary | ICD-10-CM | POA: Diagnosis not present

## 2023-04-08 DIAGNOSIS — Z862 Personal history of diseases of the blood and blood-forming organs and certain disorders involving the immune mechanism: Secondary | ICD-10-CM | POA: Diagnosis not present

## 2023-04-08 DIAGNOSIS — J45909 Unspecified asthma, uncomplicated: Secondary | ICD-10-CM | POA: Diagnosis not present

## 2023-04-08 DIAGNOSIS — F4310 Post-traumatic stress disorder, unspecified: Secondary | ICD-10-CM | POA: Diagnosis not present

## 2023-04-08 DIAGNOSIS — N958 Other specified menopausal and perimenopausal disorders: Secondary | ICD-10-CM | POA: Diagnosis not present

## 2023-04-08 DIAGNOSIS — E785 Hyperlipidemia, unspecified: Secondary | ICD-10-CM | POA: Diagnosis not present

## 2023-04-08 DIAGNOSIS — I5032 Chronic diastolic (congestive) heart failure: Secondary | ICD-10-CM | POA: Diagnosis not present

## 2023-04-08 DIAGNOSIS — M8588 Other specified disorders of bone density and structure, other site: Secondary | ICD-10-CM | POA: Diagnosis not present

## 2023-04-08 DIAGNOSIS — E669 Obesity, unspecified: Secondary | ICD-10-CM | POA: Diagnosis not present

## 2023-04-09 ENCOUNTER — Ambulatory Visit: Payer: Medicare Other | Attending: Cardiology | Admitting: Cardiology

## 2023-04-09 VITALS — BP 132/82 | HR 74 | Ht 64.0 in | Wt 184.4 lb

## 2023-04-09 DIAGNOSIS — I5189 Other ill-defined heart diseases: Secondary | ICD-10-CM | POA: Insufficient documentation

## 2023-04-09 DIAGNOSIS — I10 Essential (primary) hypertension: Secondary | ICD-10-CM | POA: Diagnosis not present

## 2023-04-09 DIAGNOSIS — E782 Mixed hyperlipidemia: Secondary | ICD-10-CM | POA: Insufficient documentation

## 2023-04-09 NOTE — Progress Notes (Signed)
Cardiology Office Note:  .   Date:  04/24/2023  ID:  Michelle Wilcox, DOB [birth year], MRN [id number] PCP: Gweneth Dimitri, MD  [facility] Providers Cardiologist:  Bryan Lemma, MD     Chief Complaint  Patient presents with   Follow-up    Doing well.  Follow-ups have been delayed because of her fall in June.  No active heart symptoms.    History of Present Illness: .     Michelle Wilcox is a  77 y.o. female  with a PMH notable for HTN with mild diastolic dysfunction, HLD (As Well As PMR and Giant Cell Arteritis) who presents here for annual follow-up at the request of Gweneth Dimitri, MD.  Michelle Wilcox was last seen on March 05, 2022 by Bernadene Person, NP.  (I last saw her in February 2022).  At this visit she was doing well.  Stable from a cardiology standpoint.  Had a mechanical fall and was "out of work" since June ".  No dyspnea, edema, weight gain, PND or orthopnea.  No angina. => Continued metoprolol and Lasix.  Continue Crestor.    Subjective   INTERVAL HISTORY ER visit for fall and mild head injury/laceration on 02/09/2023.  She noted on her chest and had some chest and back pain with tenderness palpation on the chest and back/neck.  Right forearm wound glued with Dermabond. => since then multiple appts with [facility] (bilateral black eyes, bruised ribs) &on therapy for PTSD after fall   Michelle Wilcox returns here for delayed follow-up relatively stable from a cardiac standpoint.  No active cardiac symptoms at this point.  Notably she has not been all that active since her fall back in June.  Prior to that she denied any chest pain or pressure with rest or exertion.  No exertional dyspnea. Doesn't lie flat on back 2/2 OA - but no Orthopnea & PND. MSK chest wall pain much better - no other CP or DOE.  Pain is mostly lower ribs & back.  Poor balance - using cane - but still feels "off " after fall.  She has been working with PT but is  probably been bothered with symptoms of PTSD with fall.  Rare heart fluttering - not prolonged.   Really does not have edema because she takes her Lasix 20 mg most days and occasionally will take the 40.  She has a also increased her beta-blocker dose to 50 mg twice daily.  She is taking her rosuvastatin every other day.  Cardiovascular ROS: no chest pain or dyspnea on exertion positive for - palpitations negative for - edema, orthopnea, paroxysmal nocturnal dyspnea, rapid heart rate, shortness of breath, or lightheadedness or syncope/near syncope.  She does have some poor balance and dizziness.  No TIA or amaurosis fugax symptoms.  No claudication.  ROS:  Review of Systems - Negative except Back & lower rib pain.  PTSD - poor sleep - waking up a lot * hard to go back to sleep. Easily startled by loud noises.  Still has lots of bruising and soreness.       Objective  Studies Reviewed: Marland Kitchen   EKG Interpretation Date/Time:  Tuesday April 09 2023 13:33:09 EDT Ventricular Rate:  74 PR Interval:  120 QRS Duration:  74 QT Interval:  376 QTC Calculation: 417 R Axis:   -19  Text Interpretation: Normal sinus rhythm Low voltage QRS Nonspecific ST and T wave abnormality When compared with ECG of 03-Aug-2017 18:20, Non-specific ST-t  changes Lateral leads NOW PRESENT Otherwise no significant change Confirmed by Bryan Lemma ([id number]) on 04/09/2023 1:47:40 PM   No new studies. Myoview 03/11/2018: LOW.  Normal EF.  No ischemia or infarction. Echocardiogram 08/04/2017: Normal LV size and motion.  EF 60 to 65%.  GR 1 DD.  No RWMA.  Mild MR.  Mild LA dilation.  Mild AI.  Borderline elevated RAP, suggesting mild pulm hypertension.. CT Chest 02/09/2023 (for follow-up): No acute intrathoracic trauma.  No acute airspace disease.  Stable aortic atherosclerosis and coronary atherosclerosis.  Cholelithiasis but no cholecystitis.  LABS 03/2023: Glucose 85, BUN/cR 14/0.67; k+ 4.5; TC 170, TG 133, HDL 66, LDL  81.  Risk Assessment/Calculations:            Physical Exam:   VS:  BP 132/82   Pulse 74   Ht 5\' 4"  (1.626 m)   Wt 184 lb 6.4 oz (83.6 kg)   SpO2 94%   BMI 31.65 kg/m    Wt Readings from Last 3 Encounters:  04/09/23 184 lb 6.4 oz (83.6 kg)  02/09/23 185 lb (83.9 kg)  03/05/22 176 lb 12.8 oz (80.2 kg)    GEN: Well nourished, well developed in no acute distress; mild ecchymosis on the arms and face as residual from her injuries but trivial compared to what her pictures look like. NECK: No JVD; No carotid bruits CARDIAC: Normal S1, S2; RRR, no murmurs, rubs, gallops RESPIRATORY:  Clear to auscultation without rales, wheezing or rhonchi ; nonlabored, good air movement. ABDOMEN: Soft, non-tender, non-distended EXTREMITIES:  No edema; No deformity  PSYCH: Seems somewhat timid and shy.  A little bit anxious and nervous.     ASSESSMENT AND PLAN: .    Problem List Items Addressed This Visit       Cardiology Problems   Hyperlipidemia (Chronic)    LDL up a little bit from February, but still well within goal for her risk.  Continue current dose of rosuvastatin as she is only able to tolerate every other day.      Essential hypertension (Chronic)    Stable blood pressure today.  No changes to medications.        Other   Diastolic dysfunction without heart failure - Primary (Chronic)    No CHF symptoms.  Continue to monitor blood pressures.  She is taking 50 mg twice daily Lopressor at present with standing low-dose furosemide but no afterload reduction.  If her blood pressures were to increase, would probably recommend adding ARB.      Relevant Orders   EKG 12-Lead (Completed)            Dispo: Return in about 1 year (around 04/08/2024) for 1 Yr Follow-up.  Total time spent: 22 min spent with patient + 13 min spent charting = 35 min     Signed, Marykay Lex, MD, MS Bryan Lemma, M.D., M.S. Interventional Cardiologist  [facility]  Pager #  [phone] Phone # [phone] [address] [place], [place] [address]

## 2023-04-10 DIAGNOSIS — F4310 Post-traumatic stress disorder, unspecified: Secondary | ICD-10-CM | POA: Diagnosis not present

## 2023-04-11 DIAGNOSIS — M810 Age-related osteoporosis without current pathological fracture: Secondary | ICD-10-CM | POA: Diagnosis not present

## 2023-04-11 DIAGNOSIS — E669 Obesity, unspecified: Secondary | ICD-10-CM | POA: Diagnosis not present

## 2023-04-11 DIAGNOSIS — D591 Autoimmune hemolytic anemia, unspecified: Secondary | ICD-10-CM | POA: Diagnosis not present

## 2023-04-11 DIAGNOSIS — M1991 Primary osteoarthritis, unspecified site: Secondary | ICD-10-CM | POA: Diagnosis not present

## 2023-04-11 DIAGNOSIS — M315 Giant cell arteritis with polymyalgia rheumatica: Secondary | ICD-10-CM | POA: Diagnosis not present

## 2023-04-11 DIAGNOSIS — M353 Polymyalgia rheumatica: Secondary | ICD-10-CM | POA: Diagnosis not present

## 2023-04-11 DIAGNOSIS — Z79899 Other long term (current) drug therapy: Secondary | ICD-10-CM | POA: Diagnosis not present

## 2023-04-11 DIAGNOSIS — Z6831 Body mass index (BMI) 31.0-31.9, adult: Secondary | ICD-10-CM | POA: Diagnosis not present

## 2023-04-12 ENCOUNTER — Other Ambulatory Visit: Payer: Self-pay | Admitting: Family Medicine

## 2023-04-12 DIAGNOSIS — Z1231 Encounter for screening mammogram for malignant neoplasm of breast: Secondary | ICD-10-CM

## 2023-04-17 DIAGNOSIS — F4310 Post-traumatic stress disorder, unspecified: Secondary | ICD-10-CM | POA: Diagnosis not present

## 2023-04-18 ENCOUNTER — Ambulatory Visit
Admission: RE | Admit: 2023-04-18 | Discharge: 2023-04-18 | Disposition: A | Discharge status: Home / Self Care | Payer: Medicare Other | Source: Ambulatory Visit | Attending: Family Medicine | Admitting: Family Medicine

## 2023-04-18 DIAGNOSIS — Z1231 Encounter for screening mammogram for malignant neoplasm of breast: Secondary | ICD-10-CM | POA: Diagnosis not present

## 2023-04-24 ENCOUNTER — Encounter: Payer: Self-pay | Admitting: Cardiology

## 2023-04-24 DIAGNOSIS — F4310 Post-traumatic stress disorder, unspecified: Secondary | ICD-10-CM | POA: Diagnosis not present

## 2023-04-24 DIAGNOSIS — Z23 Encounter for immunization: Secondary | ICD-10-CM | POA: Diagnosis not present

## 2023-04-24 DIAGNOSIS — Z6832 Body mass index (BMI) 32.0-32.9, adult: Secondary | ICD-10-CM | POA: Diagnosis not present

## 2023-04-24 DIAGNOSIS — M810 Age-related osteoporosis without current pathological fracture: Secondary | ICD-10-CM | POA: Diagnosis not present

## 2023-04-24 NOTE — Assessment & Plan Note (Signed)
No CHF symptoms.  Continue to monitor blood pressures.  She is taking 50 mg twice daily Lopressor at present with standing low-dose furosemide but no afterload reduction.  If her blood pressures were to increase, would probably recommend adding ARB.

## 2023-04-24 NOTE — Assessment & Plan Note (Signed)
LDL up a little bit from February, but still well within goal for her risk.  Continue current dose of rosuvastatin as she is only able to tolerate every other day.

## 2023-04-24 NOTE — Assessment & Plan Note (Signed)
Stable blood pressure today.  No changes to medications.

## 2023-04-27 DIAGNOSIS — Z23 Encounter for immunization: Secondary | ICD-10-CM | POA: Diagnosis not present

## 2023-05-01 DIAGNOSIS — F4310 Post-traumatic stress disorder, unspecified: Secondary | ICD-10-CM | POA: Diagnosis not present

## 2023-05-08 DIAGNOSIS — F4310 Post-traumatic stress disorder, unspecified: Secondary | ICD-10-CM | POA: Diagnosis not present

## 2023-05-13 ENCOUNTER — Other Ambulatory Visit: Payer: Self-pay | Admitting: *Deleted

## 2023-05-13 DIAGNOSIS — D591 Autoimmune hemolytic anemia, unspecified: Secondary | ICD-10-CM

## 2023-05-15 DIAGNOSIS — F4310 Post-traumatic stress disorder, unspecified: Secondary | ICD-10-CM | POA: Diagnosis not present

## 2023-05-20 ENCOUNTER — Inpatient Hospital Stay: Payer: Medicare Other

## 2023-05-20 ENCOUNTER — Inpatient Hospital Stay: Payer: Medicare Other | Attending: Oncology | Admitting: Oncology

## 2023-05-20 VITALS — BP 133/77 | HR 71 | Temp 97.9°F | Resp 18 | Ht 64.0 in | Wt 182.8 lb

## 2023-05-20 DIAGNOSIS — D591 Autoimmune hemolytic anemia, unspecified: Secondary | ICD-10-CM | POA: Diagnosis not present

## 2023-05-20 DIAGNOSIS — D5911 Warm autoimmune hemolytic anemia: Secondary | ICD-10-CM | POA: Insufficient documentation

## 2023-05-20 LAB — CBC WITH DIFFERENTIAL (CANCER CENTER ONLY)
Abs Immature Granulocytes: 0.03 10*3/uL (ref 0.00–0.07)
Basophils Absolute: 0.1 10*3/uL (ref 0.0–0.1)
Basophils Relative: 1 %
Eosinophils Absolute: 0.3 10*3/uL (ref 0.0–0.5)
Eosinophils Relative: 3 %
HCT: 42.4 % (ref 36.0–46.0)
Hemoglobin: 14 g/dL (ref 12.0–15.0)
Immature Granulocytes: 0 %
Lymphocytes Relative: 23 %
Lymphs Abs: 2.4 10*3/uL (ref 0.7–4.0)
MCH: 32.1 pg (ref 26.0–34.0)
MCHC: 33 g/dL (ref 30.0–36.0)
MCV: 97.2 fL (ref 80.0–100.0)
Monocytes Absolute: 0.9 10*3/uL (ref 0.1–1.0)
Monocytes Relative: 8 %
Neutro Abs: 6.5 10*3/uL (ref 1.7–7.7)
Neutrophils Relative %: 65 %
Platelet Count: 342 10*3/uL (ref 150–400)
RBC: 4.36 MIL/uL (ref 3.87–5.11)
RDW: 14.5 % (ref 11.5–15.5)
WBC Count: 10.2 10*3/uL (ref 4.0–10.5)
nRBC: 0 % (ref 0.0–0.2)

## 2023-05-20 NOTE — Progress Notes (Signed)
[facility] OFFICE PROGRESS NOTE   Diagnosis: Autoimmune hemolytic anemia  INTERVAL HISTORY:   Michelle Wilcox returns for a scheduled visit.  She generally feels well.  She is recovering from a fall during the summer.  She fell at work and developed a laceration to the forehead and nose injury.  She continues prednisone for treatment of arteritis.  She is followed by Dr. Nickola Wilcox.  Objective:  Vital signs in last 24 hours:  Blood pressure 133/77, pulse 71, temperature 97.9 F (36.6 C), temperature source Temporal, resp. rate 18, height 5\' 4"  (1.626 m), weight 182 lb 12.8 oz (82.9 kg), SpO2 98%.    Lymphatics: No cervical, supraclavicular, axillary, or inguinal nodes Resp: End inspiratory rhonchi/wheeze at the posterior base bilaterally, no respiratory distress Cardio: Regular rate and rhythm GI: No hepatomegaly Vascular: No leg edema   Lab Results:  Lab Results  Component Value Date   WBC PENDING 05/20/2023   HGB 14.0 05/20/2023   HCT 42.4 05/20/2023   MCV 97.2 05/20/2023   PLT 342 05/20/2023   NEUTROABS PENDING 05/20/2023    CMP  Lab Results  Component Value Date   NA 142 02/09/2023   K 4.4 02/09/2023   CL 99 02/09/2023   CO2 28 02/09/2023   GLUCOSE 90 02/09/2023   BUN 18 02/09/2023   CREATININE 0.78 02/09/2023   CALCIUM 9.3 02/09/2023   PROT 5.8 (L) 04/07/2018   ALBUMIN 3.8 04/07/2018   AST 19 04/07/2018   ALT 10 04/07/2018   ALKPHOS 61 04/07/2018   BILITOT 0.5 04/07/2018   GFRNONAA >60 02/09/2023   GFRAA >60 06/19/2018     Medications: I have reviewed the patient's current medications.   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, DAT IgG positive Prednisone started 03/20/2016 Tapered to 15 mg daily on 04/30/2016 Hemoglobin lower, elevated LDH 05/21/2016-prednisone increased to 30 mg daily Prednisone taper to 25 mg daily 06/18/2016 Prednisone taper to 20 mg daily 07/16/2016 Prednisone  taper to 15 mg daily 08/27/2016 Prednisone taper to 12.5 mg daily 09/25/2016 Hemoglobin lower, prednisone increased to 20 mg daily on 10/11/2016 Cycle 1 rituximab 11/19/2016 Cycle 2 rituximab 11/26/2016 Cycle 3 rituximab 12/03/2016 Cycle 4 rituximab 12/28/2016 Prednisone taper to 15 mg daily 12/28/2016 Prednisone taper to 12.5 mg daily 01/28/2017 Prednisone taper to 10 mg daily 02/19/2017 Prednisone taper to 7.5 mg daily 04/01/2017 Prednisone taper to 5 mg daily 05/20/2017 Admission with progressive anemia and evidence of hemolysis 08/03/2017 Prednisone resumed at a dose of 60 mg daily Prednisone taper to 40 mg 08/12/2017 Prednisone taper to 30 mg daily 08/24/2017 Prednisone taper to 20 mg daily 09/03/2017 Prednisone taper to 15 mg daily 09/20/2017 Prednisone taper to 12.5 mg daily 10/14/2017 Pednisone taper to 10 mg daily 11/05/2017 Progressive anemia 12/16/2017 Prednisone increased to 20 mg daily 12/16/2017 Prednisone decreased to 15 mg daily 01/06/2018 Prednisone decreased to 12.5 mg daily 02/17/2018  Prednisone taper to 12.5 mg alternating with 10 mg daily 03/17/2018 Splenectomy 06/18/2018 Prednisone decreased to 10 mg daily 07/07/2018 Prednisone decreased to 7.5 mg daily 07/30/2018 Prednisone changed to 7.5 alternating with 5 mg daily 08/18/2018 Prednisone changed to 5 mg daily 09/08/2018   2.  History of Exertional dyspnea secondary to #1   3.   Mild splenomegaly on CT 03/16/2016   4.   History of polymyalgia rheumatica   5.   History of giant cell/temporal arteritis-maintained on methotrexate in the remote past, now on low dose prednisone   6.   Admission  with leg edema 08/03/2017- diastolic heart failure?,  Exacerbated by anemia   7.   Right arm superficial phlebitis 03/17/2018, resolved       Disposition: Michelle Wilcox remains in clinical remission from the autoimmune hemolytic anemia.  She will return for an office visit and CBC in 1 year.  She is followed by Dr. Nickola Wilcox for  arteritis continues prednisone.  We will check her vaccine records from Dr. Uvaldo Wilcox.  She is due for meningococcal vaccination and potentially a pneumonia vaccine.  He has received influenza and COVID-19 vaccines.  Michelle Papas, MD  05/20/2023  9:53 AM

## 2023-05-21 ENCOUNTER — Other Ambulatory Visit: Payer: Self-pay | Admitting: *Deleted

## 2023-05-23 DIAGNOSIS — F4310 Post-traumatic stress disorder, unspecified: Secondary | ICD-10-CM | POA: Diagnosis not present

## 2023-05-29 DIAGNOSIS — F4310 Post-traumatic stress disorder, unspecified: Secondary | ICD-10-CM | POA: Diagnosis not present

## 2023-06-05 DIAGNOSIS — F4310 Post-traumatic stress disorder, unspecified: Secondary | ICD-10-CM | POA: Diagnosis not present

## 2023-06-06 DIAGNOSIS — Z6832 Body mass index (BMI) 32.0-32.9, adult: Secondary | ICD-10-CM | POA: Diagnosis not present

## 2023-06-06 DIAGNOSIS — Z1331 Encounter for screening for depression: Secondary | ICD-10-CM | POA: Diagnosis not present

## 2023-06-06 DIAGNOSIS — Z0000 Encounter for general adult medical examination without abnormal findings: Secondary | ICD-10-CM | POA: Diagnosis not present

## 2023-06-12 ENCOUNTER — Other Ambulatory Visit: Payer: Self-pay | Admitting: Oncology

## 2023-06-12 DIAGNOSIS — D591 Autoimmune hemolytic anemia, unspecified: Secondary | ICD-10-CM

## 2023-06-18 DIAGNOSIS — Z8679 Personal history of other diseases of the circulatory system: Secondary | ICD-10-CM | POA: Diagnosis not present

## 2023-06-18 DIAGNOSIS — K52831 Collagenous colitis: Secondary | ICD-10-CM | POA: Diagnosis not present

## 2023-06-18 DIAGNOSIS — Z800 Family history of malignant neoplasm of digestive organs: Secondary | ICD-10-CM | POA: Diagnosis not present

## 2023-06-18 DIAGNOSIS — Z1211 Encounter for screening for malignant neoplasm of colon: Secondary | ICD-10-CM | POA: Diagnosis not present

## 2023-06-19 DIAGNOSIS — F4310 Post-traumatic stress disorder, unspecified: Secondary | ICD-10-CM | POA: Diagnosis not present

## 2023-06-25 ENCOUNTER — Telehealth: Payer: Self-pay | Admitting: *Deleted

## 2023-06-25 NOTE — Telephone Encounter (Signed)
Per pharmacist, patient is only due the Menveo vaccine (5 years post splenectomy). Left this information on her voice mail. Can obtain this vaccine with PCP or pharmacy.

## 2023-07-03 ENCOUNTER — Telehealth: Payer: Self-pay | Admitting: *Deleted

## 2023-07-03 DIAGNOSIS — F4310 Post-traumatic stress disorder, unspecified: Secondary | ICD-10-CM | POA: Diagnosis not present

## 2023-07-03 MED ORDER — PREDNISONE 5 MG PO TABS: 5.0000 mg | ORAL_TABLET | Freq: Every day | ORAL | 3 refills | Status: DC

## 2023-07-03 NOTE — Telephone Encounter (Signed)
Needs refill on prednisone 5 mg daily and needs sent to [facility] on [address] now due to cost. Requests 90 day supply.

## 2023-07-22 ENCOUNTER — Other Ambulatory Visit: Payer: Self-pay

## 2023-07-22 ENCOUNTER — Ambulatory Visit: Payer: Medicare Other | Attending: Family Medicine

## 2023-07-22 DIAGNOSIS — M810 Age-related osteoporosis without current pathological fracture: Secondary | ICD-10-CM | POA: Diagnosis not present

## 2023-07-22 DIAGNOSIS — R2689 Other abnormalities of gait and mobility: Secondary | ICD-10-CM | POA: Insufficient documentation

## 2023-07-22 DIAGNOSIS — M5459 Other low back pain: Secondary | ICD-10-CM | POA: Diagnosis not present

## 2023-07-22 DIAGNOSIS — R293 Abnormal posture: Secondary | ICD-10-CM | POA: Diagnosis not present

## 2023-07-22 DIAGNOSIS — M6281 Muscle weakness (generalized): Secondary | ICD-10-CM | POA: Diagnosis not present

## 2023-07-22 DIAGNOSIS — F4310 Post-traumatic stress disorder, unspecified: Secondary | ICD-10-CM | POA: Diagnosis not present

## 2023-07-22 NOTE — Patient Instructions (Signed)
Osteoporosis   What is Osteoporosis?  A silent disease in which the skeleton is weakened by decreased bone density. Characterized by low bone mass, deterioration of bone, and increased risk of fracture postmenopausal (primary) or the result of an identifiable condition/event (secondary) Commonly found in the wrists, spine, and hips; these are high-risk stress areas and very susceptible to fractures.  The Facts: There are 1.5 million fractures/year 500,000 spine; 250,000 hip with over 60,000 nursing home admissions secondary to hip fracture; and 200,000 wrist After hip fracture, only 50% of people able to walk independently prior to the fracture return to independent ambulation. Bone mass: Peaks at age 4-30, and begins declining at age 73-50.   Osteoporosis is defined by the World Health Organization [facility]) as:  NOF/WHO Criteria for Interpreting Results of Bone Density Assessment  Results Diagnosis  Within 1 standard deviation (SD) of young adult mean Normal  Between 1 and -2.5 SD below mean, repeat in 2 years Low bone mass (osteopenia)  Greater than -2.5 SD below mean Osteoporosis  Greater than -2.5 SD below mean and one or more fragility fractures exist Severe Osteoporosis  *Results can be affected by positioning of the body in the DEXA scan, presence of current or old fractures, arthritis, extraneous calcifications.    Osteoporosis is not just a women's disease!  30-40% of women will develop osteoporosis 5-15% of males will develop osteoporosis   What are the risk factors?  Female Thin, small frame Caucasian, Asian race Early menopause (<67 years old)/amenorrhea/delayed puberty Old age Family history (fractures, stooped posture)\ Low calcium diet Sedentary lifestyle Alcohol, Caffeine, Smoking Malnutrition, GI Disease Prolonged use of Glucocorticoids (Prednisone), Meds to treat asthma, arthritis, cancers, thyroid, and anti-seizure meds.  How do I know for  sure?  Get a BONE DENSITY TEST!  This measures bone loss and it's painless, non-invasive, and only takes 5-10 minutes!  What can I do about it?  Decrease your risk factors (alcohol, caffeine, smoking) Helpful medications (see next page) Adequate Calcium and Vitamin D intake Get active! Proper posture - Sit and stand tall! No slouching or twisting Weight-Bearing Exercise - walking, stair climbing, elliptical; NO jogging or high-impact exercise. Resistive Exercise - Cybex weight equipment, Nautilus, dumbbells, therabands  **Be sure to maintain proper alignment when lifting any weight!!  **When using equipment, avoid abdominal exercises which involve "crunching" or curling or twisting the trunk, biceps machines, cross-country machines, moving handlebars, or ANY MACHINE WITH ROTATION OR FORWARD BENDING!!!           Approved Pharmacologic Management of Osteoporosis  Agent Approved for prevention Approved for treatment BMD increased spine/hip Fracture reduction  Estrogen/Hormone Therapy (Estrace, Estratab, Ogen, Premarin, Vivell, Prempro, Femhert, Orthoest) Yes Yes 3-6% 35% spine and hip  Bisphosphonates  (Fosamax, Actonel, Boniva) Yes Yes 3-8% 35-50% spine and non-spine  Calcitonin (Miacalcin, Calcimar, Fortical) No Yes 0-3% None stated  Raloxifene (Evista) Yes Yes 2-3% 30-55%  Parathyroid Hormone (Forteo) No Yes, only in those at high risk for fracture None stated 53-65%     Recommended Daily Calcium Intakes   Population Group NIH/NOF* (mg elemental calcium)  Children 1-10 years [phone]  Children 11-24 years 1200-1500  Men and Women 25-64 years At least 1200  Pregnant/Lactating At least 1200  Postmenopausal women with hormone replacement therapy At least 1200  Postmenopausal women without   hormone replacement therapy At least 1200  Men and women 65 + At least 1200  *In 1987, 1990, 1994, and 2000, the NIH held consensus conferences  on osteoporosis and calcium.   This column shows the most recent recommendations regarding calcium intak for preventing and managing osteoporosis.          Calcium Content of Selected Foods  Dairy Foods Calcium Content (mg) Non-Dairy Foods Calcium Content (mg)  Buttermilk, 1 cup 300 Calcium-fortified juice, 1 cup 300  Milk, 1 cup 300 Salmon, canned with Bones, 2 oz 100  Lactaid milk, 1 cup 300-500 Oysters, raw 13-19 medium 226  Soy milk, 1 cup 200-300 Sardines, canned with bones, 3 oz 372  Yogurt (plain, lowfat) 1 cup 250-300 Shrimp, canned 3 oz 98  Frozen yogurt (fruit) 1 cup 200-600 Collard greens, cooked 1 cup 357  Cheddar, mozzarella, or [place] cheese, 1oz 205 Broccoli, cooked 1 cup 78  Cottage cheese (lowfat) 4 oz 200 Soybeans, cooked 1 cup 131  Part-skim ricotta cheese, 4oz 335 Tofu, 4oz* *  Vanilla ice cream, 1 cup 120-300    *Calcium content of tofu varies depending on processing method; check nutritional label on package for precise calcium content.     Suggested Guidelines for Calcium Supplement Use:  Calcium is absorbed most efficiently if taken in small amounts throughout the day.  Always divide the daily dose into smaller amounts if the total daily dose is 500mg  or more per day.  The body cannot use more than 500mg  Calcium at any one time. The use of manufactured supplements is encouraged.  Calcium as bone meal or dolomite may contain lead or other heavy metals as contaminants. Calcium supplements should not be taken with high fiber meals or with bulk forming laxatives. If calcium carbonate is used as the supplement form, it should be taken with meals to assure that stomach acid production is present to facilitate optimal dissolution and absorption of calcium.  This is important if atrophic gastritis with hypo- or achlorhydria is present, which it is in 20-50% of older individuals. It is important to drink plenty of fluids while using the supplement to help reduce problems with side  effects like constipation or bloating.  If these symptoms become a problem, switching to another form of supplement may be the answer. Another alternative is calcium-fortified foods, including fruit juices, cereals, and breads.  These foods are now marketed with added calcium and may be less likely to cause side effects. Those with personal or family histories of kidney stones should be monitored to assure that hypercalcuria does not occur. CALCIUM INTAKE QUIZ  Dairy products are the primary source of calcium for most people.  For a quick estimate of your daily calcium intake, complete the following steps:  Use the chart below to determine your daily intake of calcium from diary foods. Servings of dairy per day 1 2 3 4 5 6 7 8   Milligrams (mg) of calcium: 250 [phone] 1250 1500 1750 2000   2.  Enter your total daily calcium intake from dairy foods:     _____mg  3.  Add 350 mg, which is the average for all other dietary sources:                 +            350 mg  4.  The sum of your total daily calcium intake:               ______mg  5.  Enter the recommended calcium intake for your age from the chart below;         ______mg  6.  Enter your daily intake from step 4 above and subtract:                             -        _______mg  7.  The result is how much additional calcium you need:                                          ______mg      Recommended Daily Calcium Intake  Population Calcium (mg)  Children 1-10 years [phone]  Children 11-24 years 1200-1500  Men and women 25-64 At least 1200  Pregnant/Lactating At least 1200  Postmenopausal women with hormone replacement therapy At least 1200  Postmenopausal women without hormone replacement therapy At least 1200  Men and women 65+ At least 1200       SAFETY TIPS FOR FALL PREVENTION   Remove throw rugs and make certain carpet edges are securely fastened to the floor.  Reduce clutter, especially in traffic areas of  the home.   Install/maintain sturdy handrails at stairs.  Increase wattage of lighting in hallways, bathrooms, kitchens, stairwells, and entrances to home.  Use night-lights near bed, in hallways, and in bathroom to improve night safety.  Install safety handrails in shower, tub, and around toilet.  Bathtubs and shower stalls should have non-skid surfaces.  When you must reach for something high, use a safety step stool, one with wide steps and a friction surface to stand on.  A type equipped with a high handrail is preferred.  If a cane or other walking aid has been recommended, use it to help increase your stability.  Wear supportive, cushioned, low-heeled shoes.  Avoid "scuffs" (backless bedroom slippers) and high heels.  Avoid rushing to answer a phone, doorbell, or anything else!  A portable phone that you can take from room to room with you is a good idea for security and safety.  Exercise regularly and stay active!!    Resources  National Osteoporosis [employer].NOF.org   Exercise for Osteoporosis; A Safe and Effective Way to Build Bone Density and Muscle Strength By: Myra Gianotti, M.A.

## 2023-07-22 NOTE — Therapy (Signed)
OUTPATIENT PHYSICAL THERAPY THORACOLUMBAR EVALUATION   Patient Name: Michelle Wilcox MRN: [id number] DOB:1945/09/09, 77 y.o., female Today's Date: 07/22/2023  END OF SESSION:  PT End of Session - 07/22/23 1449     Visit Number 1    Date for PT Re-Evaluation 09/16/23    Authorization Type Medicare B    Progress Note Due on Visit 10    PT Start Time 1403    PT Stop Time 1448    PT Time Calculation (min) 45 min    Activity Tolerance Patient tolerated treatment well    Behavior During Therapy WFL for tasks assessed/performed             Past Medical History:  Diagnosis Date   Anxiety    hx of   Asthma    at times   CHF (congestive heart failure) (HCC)    Depression    hx of   Dyspnea    Giant cell arteritis (HCC)    Gouty arthropathy    Hemolytic anemia (HCC)    Hyperlipidemia    Hypertension    Irregular heart beat    extra beat   Migraines    Polymyalgia (HCC)    Polymyalgia rheumatica (HCC)    Temporal arteritis (HCC)    Past Surgical History:  Procedure Laterality Date   BARTHOLIN GLAND CYST EXCISION     non ca   BREAST BIOPSY Right    EYE MUSCLE SURGERY     as a child 77 yrs old   KNEE SURGERY  2004   rt knee   LAPAROSCOPIC SPLENECTOMY N/A 06/18/2018   Procedure: LAPAROSCOPIC SPLENECTOMY ERAS PATHWAY;  Surgeon: Glenna Fellows, MD;  Location: [facility];  Service: General;  Laterality: N/A;   toncil     TONSILLECTOMY     as a child   Patient Active Problem List   Diagnosis Date Noted   Primary localized osteoarthrosis of multiple sites 06/15/2022   Diastolic dysfunction without heart failure 03/05/2018   DOE (dyspnea on exertion) 03/05/2018   Preop cardiovascular exam 03/05/2018   Mild reactive airways disease 08/04/2017   Volume overload 08/03/2017   Hypokalemia 08/03/2017   Essential hypertension 08/03/2017   Hyperlipidemia 08/03/2017   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   Hypersensitivity reaction 11/20/2016   Drusen  (degenerative) of retina, bilateral 05/22/2016   Nuclear cataract 05/22/2016   Retinal hemorrhage of left eye 05/22/2016   Temporal arteritis (HCC) 05/22/2016   Hemolytic anemia (HCC) 03/19/2016   Breast mass, right 07/01/2012    PCP: Gweneth Dimitri, MD  REFERRING PROVIDER: Gweneth Dimitri, MD  REFERRING DIAG: M81.0 (ICD-10-CM) - Age-related osteoporosis without current pathological fracture   Rationale for Evaluation and Treatment: Rehabilitation  THERAPY DIAG:  Abnormal posture - Plan: PT plan of care cert/re-cert  Muscle weakness (generalized) - Plan: PT plan of care cert/re-cert  Other low back pain - Plan: PT plan of care cert/re-cert  Other abnormalities of gait and mobility - Plan: PT plan of care cert/re-cert  ONSET DATE: Fall June 2024, diagnosed with osteoporosis 05/2023  SUBJECTIVE:  SUBJECTIVE STATEMENT: Pt is a 77 y.o. female who presents to PT with age related osteoporosis.  Pt had a fall in June 2024 and had PT for several months following.  She fell forward and landed on her head when at work.  She was diagnosed with osteoporosis in September 2024.  Pt uses a cane for community ambulation.   PERTINENT HISTORY:  Osteoporosis, CHF, HTN, polymyalgia, lumbar DDD  PAIN:  Are you having pain? No and Yes: NPRS scale: 0-7/10 Pain location: low back  Pain description: aching, severe  Aggravating factors: standing >15 minutes Relieving factors: sitting for a short period   PRECAUTIONS: Other: osteoporosis  RED FLAGS: None   WEIGHT BEARING RESTRICTIONS: No  FALLS:  Has patient fallen in last 6 months? Yes. 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  DIAGNOSTIC FINDINGS:  03/2023: T score at radius: -2.6 Femur -2.1  SCREENING FOR RED FLAGS: Bowel or bladder incontinence: No Spinal tumors: No Cauda equina syndrome: No Compression fracture: No Abdominal aneurysm: No  COGNITION: Overall cognitive status: Within functional limits for tasks assessed     SENSATION: WFL   POSTURE: rounded shoulders, forward head, and weight shift left  PALPATION: NA    LOWER EXTREMITY MMT:    Knee strength: Lt 4+/5, Rt 4/5, hip 4-/5 bil   FUNCTIONAL TESTS:  5 times sit to stand: 17.49   GAIT: Distance walked: 75 Assistive device utilized: None Level of assistance: Complete Independence Comments: reduced trunk rotation, forward head, antalgic with reduced time on Rt LE  TODAY'S TREATMENT:                                                                                                                              DATE: 07/22/23 HEP established- see below    PATIENT EDUCATION:  Education details: Access Code: [email or web address], osteoporosis information  Person educated: Patient Education method: Explanation, Demonstration, and Handouts Education comprehension: verbalized understanding and returned demonstration  HOME EXERCISE PROGRAM: Access Code: [email or web address] URL: [email or web address] Date: 07/22/2023 Prepared by: Tresa Endo  Exercises - Standing Hip Abduction with Counter Support  - 1 x daily - 7 x weekly - 1-2 sets - 5-10 reps - Standing Hip Extension with Counter Support  - 1 x daily - 7 x weekly - 1-2 sets - 5-10 reps - Sit to Stand Without Arm Support  - 1 x daily - 7 x weekly -  1-2 sets - 10 reps - Standing Shoulder External Rotation with Resistance  - 2 x daily - 7 x  weekly - 1-2 sets - 10 reps  ASSESSMENT:  CLINICAL IMPRESSION: Patient is a 77 y.o. female who was seen today for physical therapy evaluation and treatment for age related osteoporosis.  Pt had a fall in June 2024 and had PT after this.  Pt was diagnosed with osteoporosis with Tscores -2.1 to -2.6.  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  She reports LBP with standing > 15 minutes, 5x sit to stand time indicates a falls risk.  Hip adduction with sit to stand and is able to correct with verbal cues.  MD referred pt to receive education regarding osteoporosis and advancement of exercises with will reduce progression. Patient will benefit from skilled PT to address the below impairments and improve overall function.   OBJECTIVE IMPAIRMENTS: Abnormal gait, decreased balance, decreased endurance, decreased mobility, decreased strength, increased muscle spasms, improper body mechanics, postural dysfunction, and pain.   ACTIVITY LIMITATIONS: standing, stairs, transfers, and locomotion level  PARTICIPATION LIMITATIONS: meal prep, community activity, and occupation  PERSONAL FACTORS: Social background, Time since onset of injury/illness/exacerbation, and 1-2 comorbidities: osteoporosis, lumbar DDD  are also affecting patient's functional outcome.   REHAB POTENTIAL: Good  CLINICAL DECISION MAKING: Stable/uncomplicated  EVALUATION COMPLEXITY: Low   GOALS: Goals reviewed with patient? Yes  SHORT TERM GOALS: Target date: 08/12/2023    Patient will verbally understand ways to strengthen postural musculature. Baseline: Goal status: INITIAL  2.  Initiate HEP focusing on managing osteoporosis postural deficits and strength deficits. Baseline:  Goal status: INITIAL  3.  Perform 5x sit to stand in < or = to 14 seconds  Baseline:  17.49 Goal status: INITIAL  4.  Report > or = to 20% reduction in LBP with standing and walking  Baseline:  Goal status: INITIAL    LONG TERM GOALS: Target date: 09/16/2023    Patient will verbally understand ways to manage osteoporosis with diet and correct to reduce strain postures on spine. Baseline:  Goal status: INITIAL  2.  Patient will verbally understand correct body mechanics for home and work tasks to decrease strain on spine. Baseline:  Goal status: INITIAL  3.  Perform 5x sit to stand in < or = to 13 seconds to reduce falls risk Baseline: 17.49 seconds  Goal status: INITIAL  4.  Patient can verbally understand the dos and don'ts of osteoporosis management. Baseline:  Goal status: INITIAL  5.  Report > or = to 30% reduction in LBP with standing and walking for work tasks  Baseline: up to 7/10 Goal status: INITIAL   PLAN:  PT FREQUENCY: 1-2x/week  PT DURATION: 6 weeks  PLANNED INTERVENTIONS: 97110-Therapeutic exercises, 97530- Therapeutic activity, [id number]- Neuromuscular re-education, 97535- Self Care, [id number]- Manual therapy, [id number]- Aquatic Therapy, 97014- Electrical stimulation (unattended), [phone]- Electrical stimulation (manual), Patient/Family education, Balance training, Stair training, Taping, Dry Needling, Vestibular training, Cryotherapy, and Moist heat.  PLAN FOR NEXT SESSION: review HEP, work on postural strength, weightbearing activity. DN to low back    Lorrene Reid, PT 07/22/23 3:02 PM   [facility] [address] [place], [place] [address] Phone # [phone] Fax [phone]

## 2023-07-26 DIAGNOSIS — K5289 Other specified noninfective gastroenteritis and colitis: Secondary | ICD-10-CM | POA: Diagnosis not present

## 2023-07-26 DIAGNOSIS — Z1211 Encounter for screening for malignant neoplasm of colon: Secondary | ICD-10-CM | POA: Diagnosis not present

## 2023-07-26 DIAGNOSIS — K523 Indeterminate colitis: Secondary | ICD-10-CM | POA: Diagnosis not present

## 2023-07-26 DIAGNOSIS — Z800 Family history of malignant neoplasm of digestive organs: Secondary | ICD-10-CM | POA: Diagnosis not present

## 2023-07-26 DIAGNOSIS — K5730 Diverticulosis of large intestine without perforation or abscess without bleeding: Secondary | ICD-10-CM | POA: Diagnosis not present

## 2023-07-28 NOTE — Therapy (Signed)
OUTPATIENT PHYSICAL THERAPY THORACOLUMBAR TREATMENT   Patient Name: Michelle Wilcox MRN: [id number] DOB:1946-05-20, 77 y.o., female Today's Date: 07/29/2023  END OF SESSION:  PT End of Session - 07/29/23 1618     Visit Number 2    Date for PT Re-Evaluation 09/16/23    Authorization Type Medicare B    Progress Note Due on Visit 10    PT Start Time 1536    PT Stop Time 1618    PT Time Calculation (min) 42 min    Activity Tolerance Patient tolerated treatment well    Behavior During Therapy WFL for tasks assessed/performed              Past Medical History:  Diagnosis Date   Anxiety    hx of   Asthma    at times   CHF (congestive heart failure) (HCC)    Depression    hx of   Dyspnea    Giant cell arteritis (HCC)    Gouty arthropathy    Hemolytic anemia (HCC)    Hyperlipidemia    Hypertension    Irregular heart beat    extra beat   Migraines    Polymyalgia (HCC)    Polymyalgia rheumatica (HCC)    Temporal arteritis (HCC)    Past Surgical History:  Procedure Laterality Date   BARTHOLIN GLAND CYST EXCISION     non ca   BREAST BIOPSY Right    EYE MUSCLE SURGERY     as a child 54 yrs old   KNEE SURGERY  2004   rt knee   LAPAROSCOPIC SPLENECTOMY N/A 06/18/2018   Procedure: LAPAROSCOPIC SPLENECTOMY ERAS PATHWAY;  Surgeon: Michelle Fellows, MD;  Location: [facility];  Service: General;  Laterality: N/A;   toncil     TONSILLECTOMY     as a child   Patient Active Problem List   Diagnosis Date Noted   Primary localized osteoarthrosis of multiple sites 06/15/2022   Diastolic dysfunction without heart failure 03/05/2018   DOE (dyspnea on exertion) 03/05/2018   Preop cardiovascular exam 03/05/2018   Mild reactive airways disease 08/04/2017   Volume overload 08/03/2017   Hypokalemia 08/03/2017   Essential hypertension 08/03/2017   Hyperlipidemia 08/03/2017   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   Hypersensitivity reaction 11/20/2016   Drusen  (degenerative) of retina, bilateral 05/22/2016   Nuclear cataract 05/22/2016   Retinal hemorrhage of left eye 05/22/2016   Temporal arteritis (HCC) 05/22/2016   Hemolytic anemia (HCC) 03/19/2016   Breast mass, right 07/01/2012    PCP: Michelle Dimitri, MD  REFERRING PROVIDER: Gweneth Dimitri, MD  REFERRING DIAG: M81.0 (ICD-10-CM) - Age-related osteoporosis without current pathological fracture   Rationale for Evaluation and Treatment: Rehabilitation  THERAPY DIAG:  Abnormal posture  Muscle weakness (generalized)  Other low back pain  Other abnormalities of gait and mobility  ONSET DATE: Fall June 2024, diagnosed with osteoporosis 05/2023  SUBJECTIVE:  SUBJECTIVE STATEMENT: Patient reports she did not do her HEP due to colonoscopy prep and procedure and then she had adverse reaction to the anesthesia after.    Eval: Pt is a 77 y.o. female who presents to PT with age related osteoporosis.  Pt had a fall in June 2024 and had PT for several months following.  She fell forward and landed on her head when at work.  She was diagnosed with osteoporosis in September 2024.  Pt uses a cane for community ambulation.   PERTINENT HISTORY:  Osteoporosis, CHF, HTN, polymyalgia, lumbar DDD  PAIN:  Are you having pain? No and Yes: NPRS scale: 0-7/10 Pain location: low back  Pain description: aching, severe  Aggravating factors: standing >15 minutes Relieving factors: sitting for a short period   PRECAUTIONS: Other: osteoporosis  RED FLAGS: None   WEIGHT BEARING RESTRICTIONS: No  FALLS:  Has patient fallen in last 6 months? Yes. 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  DIAGNOSTIC FINDINGS:  03/2023: T score at radius: -2.6 Femur -2.1  SCREENING FOR RED FLAGS: Bowel or bladder incontinence: No Spinal tumors: No Cauda equina syndrome: No Compression fracture: No Abdominal aneurysm: No  COGNITION: Overall cognitive status: Within functional limits for tasks assessed     SENSATION: WFL   POSTURE: rounded shoulders, forward head, and weight shift left  PALPATION: NA    LOWER EXTREMITY MMT:    Knee strength: Lt 4+/5, Rt 4/5, hip 4-/5 bil   FUNCTIONAL TESTS:  5 times sit to stand: 17.49   GAIT: Distance walked: 75 Assistive device utilized: None Level of assistance: Complete Independence Comments: reduced trunk rotation, forward head, antalgic with reduced time on Rt LE  TODAY'S TREATMENT:                                                                                                                              DATE:  07/29/23 Nustep L5 x 5 min Standing Hip ABD 2x10  StandingHip Ext 2x10 Marching 2x10 Step ups 6 inch 2x10 B  Sit to stand no UE support 2x10 Shoulder ER with red 2x10 Leg press 30# x 20 Bridge x 8 Reviewed supine to sit transfer  07/22/23 HEP established- see below    PATIENT EDUCATION:  Education details: Access Code: [email or web address], osteoporosis information  Person educated: Patient Education method: Explanation, Demonstration, and Handouts Education comprehension: verbalized understanding and returned demonstration  HOME EXERCISE PROGRAM: Access Code: [email or web address] URL: [email or web address] Date: 07/29/2023 Prepared by: Michelle Wilcox  Exercises - Standing  Hip  Abduction with Counter Support  - 1 x daily - 7 x weekly - 1-2 sets - 5-10 reps - Standing Hip Extension with Counter Support  - 1 x daily - 7 x weekly - 1-2 sets - 5-10 reps - Sit to Stand Without Arm Support  - 1 x daily - 7 x weekly - 1-2 sets - 10 reps - Standing Shoulder External Rotation with Resistance  - 2 x daily - 7 x weekly - 1-2 sets - 10 reps - Supine Bridge  - 2 x daily - 7 x weekly - 1-3 sets - 10 reps  ASSESSMENT:  CLINICAL IMPRESSION: Michelle Wilcox presents for her first f/u visit reporting non-compliance with HEP secondary to having a colonoscopy last Friday and then having an adverse reaction to anesthesia afterwards. She tolerated all PT well without complaints of pain. She is cautious with step ups on R secondary to h/o R meniscal injury and past incidences of her R knee giving out. She should be able to progress standing TE with resistance next visit. She continues to demonstrate potential for improvement and would benefit from continued skilled therapy to address impairments.    OBJECTIVE IMPAIRMENTS: Abnormal gait, decreased balance, decreased endurance, decreased mobility, decreased strength, increased muscle spasms, improper body mechanics, postural dysfunction, and pain.   ACTIVITY LIMITATIONS: standing, stairs, transfers, and locomotion level  PARTICIPATION LIMITATIONS: meal prep, community activity, and occupation  PERSONAL FACTORS: Social background, Time since onset of injury/illness/exacerbation, and 1-2 comorbidities: osteoporosis, lumbar DDD  are also affecting patient's functional outcome.   REHAB POTENTIAL: Good  CLINICAL DECISION MAKING: Stable/uncomplicated  EVALUATION COMPLEXITY: Low   GOALS: Goals reviewed with patient? Yes  SHORT TERM GOALS: Target date: 08/12/2023    Patient will verbally understand ways to strengthen postural musculature. Baseline: Goal status: INITIAL  2.  Initiate HEP focusing on managing osteoporosis postural deficits and  strength deficits. Baseline:  Goal status: INITIAL  3.  Perform 5x sit to stand in < or = to 14 seconds  Baseline: 17.49 Goal status: INITIAL  4.  Report > or = to 20% reduction in LBP with standing and walking  Baseline:  Goal status: INITIAL    LONG TERM GOALS: Target date: 09/16/2023    Patient will verbally understand ways to manage osteoporosis with diet and correct to reduce strain postures on spine. Baseline:  Goal status: INITIAL  2.  Patient will verbally understand correct body mechanics for home and work tasks to decrease strain on spine. Baseline:  Goal status: INITIAL  3.  Perform 5x sit to stand in < or = to 13 seconds to reduce falls risk Baseline: 17.49 seconds  Goal status: INITIAL  4.  Patient can verbally understand the dos and don'ts of osteoporosis management. Baseline:  Goal status: INITIAL  5.  Report > or = to 30% reduction in LBP with standing and walking for work tasks  Baseline: up to 7/10 Goal status: INITIAL   PLAN:  PT FREQUENCY: 1-2x/week  PT DURATION: 6 weeks  PLANNED INTERVENTIONS: 97110-Therapeutic exercises, 97530- Therapeutic activity, [id number]- Neuromuscular re-education, 97535- Self Care, 16109- Manual therapy, [id number]- Aquatic Therapy, 97014- Electrical stimulation (unattended), [phone]- Electrical stimulation (manual), Patient/Family education, Balance training, Stair training, Taping, Dry Needling, Vestibular training, Cryotherapy, and Moist heat.  PLAN FOR NEXT SESSION: continue to work on postural strength, weightbearing activity. DN to low back    Solon Palm, PT  07/29/23 4:23 PM   [facility] [address] [place], [place] [address] Phone #  [phone] Fax [phone]

## 2023-07-29 ENCOUNTER — Ambulatory Visit: Payer: Medicare Other | Admitting: Physical Therapy

## 2023-07-29 ENCOUNTER — Encounter: Payer: Self-pay | Admitting: Physical Therapy

## 2023-07-29 DIAGNOSIS — R293 Abnormal posture: Secondary | ICD-10-CM

## 2023-07-29 DIAGNOSIS — R2689 Other abnormalities of gait and mobility: Secondary | ICD-10-CM | POA: Diagnosis not present

## 2023-07-29 DIAGNOSIS — M5459 Other low back pain: Secondary | ICD-10-CM | POA: Diagnosis not present

## 2023-07-29 DIAGNOSIS — M6281 Muscle weakness (generalized): Secondary | ICD-10-CM

## 2023-07-29 DIAGNOSIS — M810 Age-related osteoporosis without current pathological fracture: Secondary | ICD-10-CM | POA: Diagnosis not present

## 2023-07-30 DIAGNOSIS — K5289 Other specified noninfective gastroenteritis and colitis: Secondary | ICD-10-CM | POA: Diagnosis not present

## 2023-08-05 DIAGNOSIS — F4310 Post-traumatic stress disorder, unspecified: Secondary | ICD-10-CM | POA: Diagnosis not present

## 2023-08-08 DIAGNOSIS — Z6833 Body mass index (BMI) 33.0-33.9, adult: Secondary | ICD-10-CM | POA: Diagnosis not present

## 2023-08-08 DIAGNOSIS — F4310 Post-traumatic stress disorder, unspecified: Secondary | ICD-10-CM | POA: Diagnosis not present

## 2023-08-08 DIAGNOSIS — M85852 Other specified disorders of bone density and structure, left thigh: Secondary | ICD-10-CM | POA: Diagnosis not present

## 2023-08-19 ENCOUNTER — Encounter: Payer: Self-pay | Admitting: Physical Therapy

## 2023-08-19 ENCOUNTER — Ambulatory Visit: Payer: Medicare Other | Admitting: Physical Therapy

## 2023-08-19 DIAGNOSIS — R2689 Other abnormalities of gait and mobility: Secondary | ICD-10-CM | POA: Diagnosis not present

## 2023-08-19 DIAGNOSIS — M5459 Other low back pain: Secondary | ICD-10-CM

## 2023-08-19 DIAGNOSIS — M6281 Muscle weakness (generalized): Secondary | ICD-10-CM

## 2023-08-19 DIAGNOSIS — R293 Abnormal posture: Secondary | ICD-10-CM | POA: Diagnosis not present

## 2023-08-19 DIAGNOSIS — M810 Age-related osteoporosis without current pathological fracture: Secondary | ICD-10-CM | POA: Diagnosis not present

## 2023-08-19 NOTE — Therapy (Signed)
OUTPATIENT PHYSICAL THERAPY THORACOLUMBAR TREATMENT   Patient Name: Michelle Wilcox MRN: [id number] DOB:[birth year], 77 y.o., female Today's Date: 08/19/2023  END OF SESSION:  PT End of Session - 08/19/23 1018     Visit Number 3    Date for PT Re-Evaluation 09/16/23    Authorization Type Medicare B    Progress Note Due on Visit 10    PT Start Time 0935    PT Stop Time 1016    PT Time Calculation (min) 41 min    Activity Tolerance Patient tolerated treatment well    Behavior During Therapy WFL for tasks assessed/performed               Past Medical History:  Diagnosis Date   Anxiety    hx of   Asthma    at times   CHF (congestive heart failure) (HCC)    Depression    hx of   Dyspnea    Giant cell arteritis (HCC)    Gouty arthropathy    Hemolytic anemia (HCC)    Hyperlipidemia    Hypertension    Irregular heart beat    extra beat   Migraines    Polymyalgia (HCC)    Polymyalgia rheumatica (HCC)    Temporal arteritis (HCC)    Past Surgical History:  Procedure Laterality Date   BARTHOLIN GLAND CYST EXCISION     non ca   BREAST BIOPSY Right    EYE MUSCLE SURGERY     as a child 69 yrs old   KNEE SURGERY  2004   rt knee   LAPAROSCOPIC SPLENECTOMY N/A 06/18/2018   Procedure: LAPAROSCOPIC SPLENECTOMY ERAS PATHWAY;  Surgeon: Glenna Fellows, MD;  Location: [facility];  Service: General;  Laterality: N/A;   toncil     TONSILLECTOMY     as a child   Patient Active Problem List   Diagnosis Date Noted   Primary localized osteoarthrosis of multiple sites 06/15/2022   Diastolic dysfunction without heart failure 03/05/2018   DOE (dyspnea on exertion) 03/05/2018   Preop cardiovascular exam 03/05/2018   Mild reactive airways disease 08/04/2017   Volume overload 08/03/2017   Hypokalemia 08/03/2017   Essential hypertension 08/03/2017   Hyperlipidemia 08/03/2017   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   Hypersensitivity reaction 11/20/2016   Drusen  (degenerative) of retina, bilateral 05/22/2016   Nuclear cataract 05/22/2016   Retinal hemorrhage of left eye 05/22/2016   Temporal arteritis (HCC) 05/22/2016   Hemolytic anemia (HCC) 03/19/2016   Breast mass, right 07/01/2012    PCP: Gweneth Dimitri, MD  REFERRING PROVIDER: Gweneth Dimitri, MD  REFERRING DIAG: M81.0 (ICD-10-CM) - Age-related osteoporosis without current pathological fracture   Rationale for Evaluation and Treatment: Rehabilitation  THERAPY DIAG:  Abnormal posture  Muscle weakness (generalized)  Other low back pain  Other abnormalities of gait and mobility  ONSET DATE: Fall June 2024, diagnosed with osteoporosis 05/2023  SUBJECTIVE:  SUBJECTIVE STATEMENT: Patient reports she is doing okay today. She is having joint tightness. She has not been able to keep up with her HEP during the holidays.   Eval: Pt is a 77 y.o. female who presents to PT with age related osteoporosis.  Pt had a fall in June 2024 and had PT for several months following.  She fell forward and landed on her head when at work.  She was diagnosed with osteoporosis in September 2024.  Pt uses a cane for community ambulation.   PERTINENT HISTORY:  Osteoporosis, CHF, HTN, polymyalgia, lumbar DDD  PAIN: 08/19/2023 Are you having pain? No and Yes: NPRS scale: 2/10 Pain location: low back  Pain description: aching, severe  Aggravating factors: standing >15 minutes Relieving factors: sitting for a short period   PRECAUTIONS: Other: osteoporosis  RED FLAGS: None   WEIGHT BEARING RESTRICTIONS: No  FALLS:  Has patient fallen in last 6 months? Yes. 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  DIAGNOSTIC FINDINGS:  03/2023: T score at radius: -2.6 Femur -2.1  SCREENING FOR RED FLAGS: Bowel or bladder incontinence: No Spinal tumors: No Cauda equina syndrome: No Compression fracture: No Abdominal aneurysm: No  COGNITION: Overall cognitive status: Within functional limits for tasks assessed     SENSATION: WFL   POSTURE: rounded shoulders, forward head, and weight shift left  PALPATION: NA    LOWER EXTREMITY MMT:    Knee strength: Lt 4+/5, Rt 4/5, hip 4-/5 bil   FUNCTIONAL TESTS:  5 times sit to stand: 17.49   GAIT: Distance walked: 75 Assistive device utilized: None Level of assistance: Complete Independence Comments: reduced trunk rotation, forward head, antalgic with reduced time on Rt LE  TODAY'S TREATMENT:                                                                                                                              DATE:  08/19/23 Nustep L4 x 5 min Stability ball stretch forwards x 8 Leg press 35# x 20 seat 7 Hurdles (forwards/ sideways) unilateral UE support on barre x 3 each Seated hamstring stretch 2 x 30 sec bilateral  Standing shoulder row & extension with red TB 2 x 10 Sit to stand no UE support x10 (hard on Rt knee) Seated abduction with red TB 2 x 10 Airex Step Ups unilateral support on barre X 10 each Hip abduction, Hip extension on Airex x 12 each Heel Raises on Airex 2 x 10 Weight shifts on airex (M/L) 1.5 min  Standing Hip ABD 2x10 2# AW StandingHip Ext 2x10 2# AW Marching 2x10 Step ups 6 inch 2x10 B  Sit to stand no UE support 2x10 Shoulder ER with red  2x10 Leg press 35# x 20 seat 7 Reviewed supine to sit transfer    07/29/23 Nustep L5 x 5 min Standing Hip ABD 2x10  StandingHip Ext 2x10 Marching 2x10 Step ups 6 inch 2x10 B  Sit to stand no UE support 2x10 Shoulder ER with red 2x10 Leg press 30# x 20 Bridge x 8 Reviewed supine to sit transfer  07/22/23 HEP established- see below    PATIENT EDUCATION:  Education details: Access Code: [email or web address], osteoporosis information  Person educated: Patient Education method: Explanation, Demonstration, and Handouts Education comprehension: verbalized understanding and returned demonstration  HOME EXERCISE PROGRAM: Access Code: [email or web address] URL: [email or web address] Date: 08/19/2023 Prepared by: Claude Manges  Exercises - Standing Hip Abduction with Counter Support  - 1 x daily - 7 x weekly - 1-2 sets - 5-10 reps - Standing Hip Extension with Counter Support  - 1 x daily - 7 x weekly - 1-2 sets - 5-10 reps - Sit to Stand Without Arm Support  - 1 x daily - 7 x weekly - 1-2 sets - 10 reps - Standing Shoulder External Rotation with Resistance  - 2 x daily - 7 x weekly - 1-2 sets - 10 reps - Supine Bridge  - 2 x daily - 7 x weekly - 1-3 sets - 10 reps - Standing Shoulder Row with Anchored Resistance  - 1 x daily - 7 x weekly - 2 sets - 10 reps - Shoulder extension with resistance - Neutral  - 1 x daily - 7 x weekly - 2 sets - 10 reps - Standing Shoulder Horizontal Abduction with Resistance  - 1 x daily - 7 x weekly - 2 sets - 10 reps  ASSESSMENT:  CLINICAL IMPRESSION: Zlata presents to therapy 4 weeks after her last visit. She verbalized she has not be compliant with HEP due to the holidays. Her pain levels have been generally low. She tolerated treatment session well and did not verbalize any pain or discomfort. With hurdle negotiation patient verbalized feeling more unsteady when standing on Rt leg. Patient required UE support while completing exercise. Updated HEP to include  periscapular strengthening exercises. Patient will benefit from skilled PT to address the below impairments and improve overall function.   OBJECTIVE IMPAIRMENTS: Abnormal gait, decreased balance, decreased endurance, decreased mobility, decreased strength, increased muscle spasms, improper body mechanics, postural dysfunction, and pain.   ACTIVITY LIMITATIONS: standing, stairs, transfers, and locomotion level  PARTICIPATION LIMITATIONS: meal prep, community activity, and occupation  PERSONAL FACTORS: Social background, Time since onset of injury/illness/exacerbation, and 1-2 comorbidities: osteoporosis, lumbar DDD  are also affecting patient's functional outcome.   REHAB POTENTIAL: Good  CLINICAL DECISION MAKING: Stable/uncomplicated  EVALUATION COMPLEXITY: Low   GOALS: Goals reviewed with patient? Yes  SHORT TERM GOALS: Target date: 08/12/2023    Patient will verbally understand ways to strengthen postural musculature. Baseline: Goal status: INITIAL  2.  Initiate HEP focusing on managing osteoporosis postural deficits and strength deficits. Baseline:  Goal status: INITIAL  3.  Perform 5x sit to stand in < or = to 14 seconds  Baseline: 17.49 Goal status: INITIAL  4.  Report > or = to 20% reduction in LBP with standing and walking  Baseline:  Goal status: INITIAL    LONG TERM GOALS: Target date: 09/16/2023    Patient will verbally  understand ways to manage osteoporosis with diet and correct to reduce strain postures on spine. Baseline:  Goal status: INITIAL  2.  Patient will verbally understand correct body mechanics for home and work tasks to decrease strain on spine. Baseline:  Goal status: INITIAL  3.  Perform 5x sit to stand in < or = to 13 seconds to reduce falls risk Baseline: 17.49 seconds  Goal status: INITIAL  4.  Patient can verbally understand the dos and don'ts of osteoporosis management. Baseline:  Goal status: INITIAL  5.  Report > or = to 30%  reduction in LBP with standing and walking for work tasks  Baseline: up to 7/10 Goal status: INITIAL   PLAN:  PT FREQUENCY: 1-2x/week  PT DURATION: 6 weeks  PLANNED INTERVENTIONS: 97110-Therapeutic exercises, 97530- Therapeutic activity, [id number]- Neuromuscular re-education, 97535- Self Care, 16109- Manual therapy, [id number]- Aquatic Therapy, 97014- Electrical stimulation (unattended), [phone]- Electrical stimulation (manual), Patient/Family education, Balance training, Stair training, Taping, Dry Needling, Vestibular training, Cryotherapy, and Moist heat.  PLAN FOR NEXT SESSION: assess tolerance to treatment session; step ups; sit to stands; walking with turning & head turns    Claude Manges, PT 08/19/23 10:19 AM [facility] [address], [address] [place], [place] [address] Phone # [phone] Fax [phone]

## 2023-09-04 DIAGNOSIS — T68XXXA Hypothermia, initial encounter: Secondary | ICD-10-CM | POA: Diagnosis not present

## 2023-09-04 DIAGNOSIS — Z6833 Body mass index (BMI) 33.0-33.9, adult: Secondary | ICD-10-CM | POA: Diagnosis not present

## 2023-09-16 ENCOUNTER — Ambulatory Visit: Payer: Medicare Other | Attending: Family Medicine

## 2023-09-16 DIAGNOSIS — M5459 Other low back pain: Secondary | ICD-10-CM | POA: Insufficient documentation

## 2023-09-16 DIAGNOSIS — M6281 Muscle weakness (generalized): Secondary | ICD-10-CM | POA: Insufficient documentation

## 2023-09-16 DIAGNOSIS — R293 Abnormal posture: Secondary | ICD-10-CM | POA: Insufficient documentation

## 2023-09-16 DIAGNOSIS — R2689 Other abnormalities of gait and mobility: Secondary | ICD-10-CM | POA: Insufficient documentation

## 2023-09-16 NOTE — Therapy (Signed)
OUTPATIENT PHYSICAL THERAPY THORACOLUMBAR TREATMENT   Patient Name: Michelle Wilcox MRN: [id number] DOB:12/26/1945, 78 y.o., female Today's Date: 09/16/2023  END OF SESSION:  PT End of Session - 09/16/23 1042     Visit Number 4    Date for PT Re-Evaluation 11/01/23    Authorization Type Medicare B    Progress Note Due on Visit 10    PT Start Time 1016    PT Stop Time 1101    PT Time Calculation (min) 45 min    Activity Tolerance Patient tolerated treatment well    Behavior During Therapy WFL for tasks assessed/performed                Past Medical History:  Diagnosis Date   Anxiety    hx of   Asthma    at times   CHF (congestive heart failure) (HCC)    Depression    hx of   Dyspnea    Giant cell arteritis (HCC)    Gouty arthropathy    Hemolytic anemia (HCC)    Hyperlipidemia    Hypertension    Irregular heart beat    extra beat   Migraines    Polymyalgia (HCC)    Polymyalgia rheumatica (HCC)    Temporal arteritis (HCC)    Past Surgical History:  Procedure Laterality Date   BARTHOLIN GLAND CYST EXCISION     non ca   BREAST BIOPSY Right    EYE MUSCLE SURGERY     as a child 33 yrs old   KNEE SURGERY  2004   rt knee   LAPAROSCOPIC SPLENECTOMY N/A 06/18/2018   Procedure: LAPAROSCOPIC SPLENECTOMY ERAS PATHWAY;  Surgeon: Glenna Fellows, MD;  Location: [facility];  Service: General;  Laterality: N/A;   toncil     TONSILLECTOMY     as a child   Patient Active Problem List   Diagnosis Date Noted   Primary localized osteoarthrosis of multiple sites 06/15/2022   Diastolic dysfunction without heart failure 03/05/2018   DOE (dyspnea on exertion) 03/05/2018   Preop cardiovascular exam 03/05/2018   Mild reactive airways disease 08/04/2017   Volume overload 08/03/2017   Hypokalemia 08/03/2017   Essential hypertension 08/03/2017   Hyperlipidemia 08/03/2017   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   Hypersensitivity reaction 11/20/2016   Drusen  (degenerative) of retina, bilateral 05/22/2016   Nuclear cataract 05/22/2016   Retinal hemorrhage of left eye 05/22/2016   Temporal arteritis (HCC) 05/22/2016   Hemolytic anemia (HCC) 03/19/2016   Breast mass, right 07/01/2012    PCP: Gweneth Dimitri, MD  REFERRING PROVIDER: Gweneth Dimitri, MD  REFERRING DIAG: M81.0 (ICD-10-CM) - Age-related osteoporosis without current pathological fracture   Rationale for Evaluation and Treatment: Rehabilitation  THERAPY DIAG:  Abnormal posture - Plan: PT plan of care cert/re-cert  Muscle weakness (generalized) - Plan: PT plan of care cert/re-cert  Other low back pain - Plan: PT plan of care cert/re-cert  Other abnormalities of gait and mobility - Plan: PT plan of care cert/re-cert  ONSET DATE: Fall June 2024, diagnosed with osteoporosis 05/2023  SUBJECTIVE:  SUBJECTIVE STATEMENT: I haven't been doing my homework as much as I should. I have had to miss PT the last couple of weeks due to weather.  Therapy is helping me.   Eval: Pt is a 78 y.o. female who presents to PT with age related osteoporosis.  Pt had a fall in June 2024 and had PT for several months following.  She fell forward and landed on her head when at work.  She was diagnosed with osteoporosis in September 2024.  Pt uses a cane for community ambulation.   PERTINENT HISTORY:  Osteoporosis, CHF, HTN, polymyalgia, lumbar DDD  PAIN: 09/16/23 Are you having pain? No and Yes: NPRS scale: 0-7/10 Pain location: low back  Pain description: aching, severe  Aggravating factors: standing in one spot and not moving Relieving factors: sitting for a short period, moving around   PRECAUTIONS: Other: osteoporosis  RED FLAGS: None   WEIGHT BEARING RESTRICTIONS: No  FALLS:  Has patient fallen in last 6  months? Yes. 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  DIAGNOSTIC FINDINGS:  03/2023: T score at radius: -2.6 Femur -2.1  SCREENING FOR RED FLAGS: Bowel or bladder incontinence: No Spinal tumors: No Cauda equina syndrome: No Compression fracture: No Abdominal aneurysm: No  COGNITION: Overall cognitive status: Within functional limits for tasks assessed     SENSATION: WFL   POSTURE: rounded shoulders, forward head, and weight shift left  PALPATION: NA  LOWER EXTREMITY MMT:   Knee strength: Lt 4+/5, Rt 4/5, hip 4-/5 bil  FUNCTIONAL TESTS:  5 times sit to stand: 17.49  09/16/23: 5x sit to stand: 15 seconds without hands  GAIT: Distance walked: 75 Assistive device utilized: None Level of assistance: Complete Independence Comments: reduced trunk rotation, forward head, antalgic with reduced time on Rt LE  TODAY'S TREATMENT:                                                                                                                              DATE:  09/16/23   Nustep L4 x 6 min-PT present to discuss progress Stability ball stretch forwards x 8 Leg press 35# 2x10 seat 7 Seated hamstring stretch 2 x 30 sec bilateral  Standing shoulder row & extension with red TB 2 x 10 Sit to stand no UE support 2x10 with 5# kettlebell Walk around clinic with farmer's carry: 5#- 2 laps  Seated ball roll outs-performed between laps of farmer's carry  08/19/23   Nustep L4 x 5 min Check STGs Stability ball  stretch  forwards x 8 Leg press 35# x 20 seat 7 Hurdles (forwards/ sideways) unilateral UE support on barre x 3 each Seated hamstring stretch 2 x 30 sec bilateral  Standing shoulder row & extension with red TB 2 x 10 Sit to stand no UE support x10 (hard on Rt knee) Seated abduction with red TB 2 x 10 Airex Step Ups unilateral support on barre X 10 each Hip abduction, Hip extension on Airex x 12 each Heel Raises on Airex 2 x 10 Weight shifts on airex (M/L) 1.5 min  07/29/23 Nustep L4 x 5 min Stability ball stretch forwards x 8 Leg press 35# x 20 seat 7 Hurdles (forwards/ sideways) unilateral UE support on barre x 3 each Seated hamstring stretch 2 x 30 sec bilateral  Standing shoulder row & extension with red TB 2 x 10 Sit to stand no UE support x10 (hard on Rt knee) Seated abduction with red TB 2 x 10 Airex Step Ups unilateral support on barre X 10 each Hip abduction, Hip extension on Airex x 12 each Heel Raises on Airex 2 x 10 Weight shifts on airex (M/L) 1.5 min  PATIENT EDUCATION:  Education details: Access Code: [email or web address], osteoporosis information  Person educated: Patient Education method: Explanation, Demonstration, and Handouts Education comprehension: verbalized understanding and returned demonstration  HOME EXERCISE PROGRAM: Access Code: [email or web address] URL: [email or web address] Date: 08/19/2023 Prepared by: Claude Manges  Exercises - Standing Hip Abduction with Counter Support  - 1 x daily - 7 x weekly - 1-2 sets - 5-10 reps - Standing Hip Extension with Counter Support  - 1 x daily - 7 x weekly - 1-2 sets - 5-10 reps - Sit to Stand Without Arm Support  - 1 x daily - 7 x weekly - 1-2 sets - 10 reps - Standing Shoulder External Rotation with Resistance  - 2 x daily - 7 x weekly - 1-2 sets - 10 reps - Supine Bridge  - 2 x daily - 7 x weekly - 1-3 sets - 10 reps - Standing Shoulder Row with Anchored Resistance  - 1 x daily - 7 x weekly - 2 sets - 10 reps -  Shoulder extension with resistance - Neutral  - 1 x daily - 7 x weekly - 2 sets - 10 reps - Standing Shoulder Horizontal Abduction with Resistance  - 1 x daily - 7 x weekly - 2 sets - 10 reps  ASSESSMENT:  CLINICAL IMPRESSION: Pt with limited attendance with PT due to weather over the past few weeks so limited progress toward goals.  Pt reports minimal to moderate compliance with HEP and PT emphasized the importance of compliance to help with bone density, balance and postural strength.  PT reviewed all with pt today and provided verbal cues for technique.  5x sit to stand time is improved, indicating reduced falls risk. Pt has received osteoporosis education and further education will be provided in future sessions.  Patient will benefit from skilled PT to address the below impairments and improve overall function.    OBJECTIVE IMPAIRMENTS: Abnormal gait, decreased balance, decreased endurance, decreased mobility, decreased strength, increased muscle spasms, improper body mechanics, postural dysfunction, and pain.   ACTIVITY LIMITATIONS: standing, stairs, transfers, and locomotion level  PARTICIPATION LIMITATIONS: meal prep, community activity, and occupation  PERSONAL FACTORS: Social background, Time since onset of injury/illness/exacerbation, and 1-2 comorbidities: osteoporosis, lumbar DDD  are also affecting patient's functional outcome.   REHAB POTENTIAL: Good  CLINICAL DECISION MAKING: Stable/uncomplicated  EVALUATION COMPLEXITY: Low  GOALS: Goals reviewed with patient? Yes  SHORT TERM GOALS: Target date: 10/11/23    Patient will verbally understand ways to strengthen postural musculature. Baseline: limited attendance so slow progression of HEP Goal status: partially met   2.  Initiate HEP focusing on managing osteoporosis postural deficits and strength deficits. Baseline:  Goal status: INITIAL  3.  Perform 5x sit to stand in < or = to 14 seconds  Baseline: 15 seconds  (09/16/23) Goal status: in progress   4.  Report > or = to 20% reduction in LBP with standing and walking  Baseline:  Goal status: INITIAL    LONG TERM GOALS: Target date: 11/01/23    Patient will verbally understand ways to manage osteoporosis with diet and correct to reduce strain postures on spine. Baseline:  Goal status: INITIAL  2.  Patient will verbally understand correct body mechanics for home and work tasks to decrease strain on spine. Baseline:  Goal status: INITIAL  3.  Perform 5x sit to stand in < or = to 13 seconds to reduce falls risk Baseline: 15 seconds (09/16/23) Goal status: In progress   4.  Patient can verbally understand the dos and don'ts of osteoporosis management. Baseline:  Goal status: INITIAL  5.  Report > or = to 30% reduction in LBP with standing and walking for work tasks  Baseline: up to 7/10 with static standing only (09/16/23) Goal status: In progress   PLAN:  PT FREQUENCY: 1x/week  PT DURATION: 6 weeks  PLANNED INTERVENTIONS: 97110-Therapeutic exercises, 97530- Therapeutic activity, [id number]- Neuromuscular re-education, 97535- Self Care, 95284- Manual therapy, [id number]- Aquatic Therapy, 97014- Electrical stimulation (unattended), [phone]- Electrical stimulation (manual), Patient/Family education, Balance training, Stair training, Taping, Dry Needling, Vestibular training, Cryotherapy, and Moist heat.  PLAN FOR NEXT SESSION: assess tolerance to treatment session; step ups; sit to stands; walking with turning & head turns.  Provided body mechanics info related to osteoporosis    Lorrene Reid, PT 09/16/23 11:07 AM  [facility] [address] [place], [place] [address] Phone # [phone] Fax [phone]

## 2023-09-23 ENCOUNTER — Ambulatory Visit: Payer: Medicare Other | Attending: Family Medicine

## 2023-09-23 DIAGNOSIS — R293 Abnormal posture: Secondary | ICD-10-CM | POA: Insufficient documentation

## 2023-09-23 DIAGNOSIS — R2689 Other abnormalities of gait and mobility: Secondary | ICD-10-CM | POA: Diagnosis not present

## 2023-09-23 DIAGNOSIS — M5459 Other low back pain: Secondary | ICD-10-CM | POA: Insufficient documentation

## 2023-09-23 DIAGNOSIS — M6281 Muscle weakness (generalized): Secondary | ICD-10-CM | POA: Insufficient documentation

## 2023-09-23 DIAGNOSIS — F4310 Post-traumatic stress disorder, unspecified: Secondary | ICD-10-CM | POA: Diagnosis not present

## 2023-09-23 NOTE — Patient Instructions (Signed)
DO's and DON'T's   Avoid and/or Minimize positions of forward bending ( flexion)  Side bending and rotation of the trunk  Especially when movements occur together   When your back aches:   Don't sit down   Lie down on your back with a small pillow under your head and one under your knees or as outlined by our therapist. Or, lie in the 90/90 position ( on the floor with your feet and legs on the sofa with knees and hips bent to 90 degrees)  Tying or putting on your shoes:   Don't bend over to tie your shoes or put on socks.  Instead, bring one foot up, cross it over the opposite knee and bend forward (hinge) at the hips to so the task.  Keep your back straight.  If you cannot do this safely, then you need to use long handled assistive devices such as a shoehorn and sock puller.  Exercising:  Don't engage in ballistic types of exercise routines such as high-impact aerobics or jumping rope  Don't do exercises in the gym that bring you forward (abdominal crunches, sit-ups, touching your  toes, knee-to-chest, straight leg raising.)  Follow a regular exercise program that includes a variety of different weight-bearing activities, such as low-impact aerobics, T' ai chi or walking as your physical therapist advises  Do exercises that emphasize return to normal body alignment and strengthening of the muscles that keep your back straight, as outlined in this program or by your therapist  Household tasks:  Don't reach unnecessarily or twist your trunk when mopping, sweeping, vacuuming, raking, making beds, weeding gardens, getting objects ou of cupboards, etc.  Keep your broom, mop, vacuum, or rake close to you and mover your whole body as you move them. Walk over to the area on which you are working. Arrange kitchen, bathroom, and bedroom shelves so that frequently used items may be reached without excessive bending, twisting, and reaching.  Use a  sturdy stool if necessary.  Don't bend from the waist to pick up something up  Off the floor, out of the trunk of your car, or to brush your teeth, wash your face, etc.   Bend at the knees, keeping back straight as possible. Use a reacher if necessary.   Prevention of fracture is the so-called "BOTTOm -Line" in the management of OSTEOPOROSIS. Do not take unnecessary chances in movement. Once a compression fracture occurs, the process is very difficult to control; one fracture is frequently followed by many more.   

## 2023-09-23 NOTE — Therapy (Signed)
OUTPATIENT PHYSICAL THERAPY THORACOLUMBAR TREATMENT   Patient Name: Michelle Wilcox MRN: [id number] DOB:[birth year], 78 y.o., female Today's Date: 09/23/2023  END OF SESSION:  PT End of Session - 09/23/23 1528     Visit Number 5    Date for PT Re-Evaluation 11/01/23    Authorization Type Medicare B    Progress Note Due on Visit 10    PT Start Time 1446    PT Stop Time 1528    PT Time Calculation (min) 42 min    Activity Tolerance Patient tolerated treatment well    Behavior During Therapy WFL for tasks assessed/performed                 Past Medical History:  Diagnosis Date   Anxiety    hx of   Asthma    at times   CHF (congestive heart failure) (HCC)    Depression    hx of   Dyspnea    Giant cell arteritis (HCC)    Gouty arthropathy    Hemolytic anemia (HCC)    Hyperlipidemia    Hypertension    Irregular heart beat    extra beat   Migraines    Polymyalgia (HCC)    Polymyalgia rheumatica (HCC)    Temporal arteritis (HCC)    Past Surgical History:  Procedure Laterality Date   BARTHOLIN GLAND CYST EXCISION     non ca   BREAST BIOPSY Right    EYE MUSCLE SURGERY     as a child 37 yrs old   KNEE SURGERY  2004   rt knee   LAPAROSCOPIC SPLENECTOMY N/A 06/18/2018   Procedure: LAPAROSCOPIC SPLENECTOMY ERAS PATHWAY;  Surgeon: Glenna Fellows, MD;  Location: [facility];  Service: General;  Laterality: N/A;   toncil     TONSILLECTOMY     as a child   Patient Active Problem List   Diagnosis Date Noted   Primary localized osteoarthrosis of multiple sites 06/15/2022   Diastolic dysfunction without heart failure 03/05/2018   DOE (dyspnea on exertion) 03/05/2018   Preop cardiovascular exam 03/05/2018   Mild reactive airways disease 08/04/2017   Volume overload 08/03/2017   Hypokalemia 08/03/2017   Essential hypertension 08/03/2017   Hyperlipidemia 08/03/2017   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   Hypersensitivity reaction 11/20/2016    Drusen (degenerative) of retina, bilateral 05/22/2016   Nuclear cataract 05/22/2016   Retinal hemorrhage of left eye 05/22/2016   Temporal arteritis (HCC) 05/22/2016   Hemolytic anemia (HCC) 03/19/2016   Breast mass, right 07/01/2012    PCP: Gweneth Dimitri, MD  REFERRING PROVIDER: Gweneth Dimitri, MD  REFERRING DIAG: M81.0 (ICD-10-CM) - Age-related osteoporosis without current pathological fracture   Rationale for Evaluation and Treatment: Rehabilitation  THERAPY DIAG:  Abnormal posture  Muscle weakness (generalized)  Other low back pain  Other abnormalities of gait and mobility  ONSET DATE: Fall June 2024, diagnosed with osteoporosis 05/2023  SUBJECTIVE:  SUBJECTIVE STATEMENT: I have done my exercises more frequently. My back isn't feeling bad today.   Eval: Pt is a 78 y.o. female who presents to PT with age related osteoporosis.  Pt had a fall in June 2024 and had PT for several months following.  She fell forward and landed on her head when at work.  She was diagnosed with osteoporosis in September 2024.  Pt uses a cane for community ambulation.   PERTINENT HISTORY:  Osteoporosis, CHF, HTN, polymyalgia, lumbar DDD  PAIN: 09/23/23 Are you having pain? No and Yes: NPRS scale: 0-7/10 Pain location: low back  Pain description: aching, severe  Aggravating factors: standing in one spot and not moving Relieving factors: sitting for a short period, moving around   PRECAUTIONS: Other: osteoporosis  RED FLAGS: None   WEIGHT BEARING RESTRICTIONS: No  FALLS:  Has patient fallen in last 6 months? Yes. 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  DIAGNOSTIC FINDINGS:  03/2023: T score at radius: -2.6 Femur -2.1  SCREENING FOR RED FLAGS: Bowel or bladder incontinence: No Spinal tumors: No Cauda equina syndrome: No Compression fracture: No Abdominal aneurysm: No  COGNITION: Overall cognitive status: Within functional limits for tasks assessed     SENSATION: WFL   POSTURE: rounded shoulders, forward head, and weight shift left  PALPATION: NA  LOWER EXTREMITY MMT:   Knee strength: Lt 4+/5, Rt 4/5, hip 4-/5 bil  FUNCTIONAL TESTS:  5 times sit to stand: 17.49  09/16/23: 5x sit to stand: 15 seconds without hands  GAIT: Distance walked: 75 Assistive device utilized: None Level of assistance: Complete Independence Comments: reduced trunk rotation, forward head, antalgic with reduced time on Rt LE  TODAY'S TREATMENT:                                                                                                                              DATE:  09/23/23   Nustep L4 x 7 min-PT present to discuss progress Do/Don't osteoporosis education Seated hamstring stretch 2 x 30 sec bilateral  Standing shoulder row & extension with red TB 2 x 10 Sit to stand no UE support 2x10 with 5# kettlebell Walk around clinic with farmer's carry: 5#- 2 laps Alternating step-taps: 6" step 2x10   09/16/23   Nustep L4 x 6 min-PT present to discuss progress Stability ball stretch forwards x 8 Leg press 35# 2x10 seat 7 Seated hamstring stretch 2 x 30 sec bilateral  Standing shoulder row & extension with red TB 2 x 10  Sit to stand no UE support 2x10 with 5# kettlebell Walk around clinic with farmer's carry: 5#- 2  laps  Seated ball roll outs-performed between laps of farmer's carry  08/19/23   Nustep L4 x 5 min Check STGs Stability ball stretch forwards x 8 Leg press 35# x 20 seat 7 Hurdles (forwards/ sideways) unilateral UE support on barre x 3 each Seated hamstring stretch 2 x 30 sec bilateral  Standing shoulder row & extension with red TB 2 x 10 Sit to stand no UE support x10 (hard on Rt knee) Seated abduction with red TB 2 x 10 Airex Step Ups unilateral support on barre X 10 each Hip abduction, Hip extension on Airex x 12 each Heel Raises on Airex 2 x 10 Weight shifts on airex (M/L) 1.5 min   PATIENT EDUCATION:  Education details: Access Code: [email or web address], osteoporosis information  Person educated: Patient Education method: Explanation, Demonstration, and Handouts Education comprehension: verbalized understanding and returned demonstration  HOME EXERCISE PROGRAM: Access Code: [email or web address] URL: [email or web address] Date: 08/19/2023 Prepared by: Claude Manges  Exercises - Standing Hip Abduction with Counter Support  - 1 x daily - 7 x weekly - 1-2 sets - 5-10 reps - Standing Hip Extension with Counter Support  - 1 x daily - 7 x weekly - 1-2 sets - 5-10 reps - Sit to Stand Without Arm Support  - 1 x daily - 7 x weekly - 1-2 sets - 10 reps - Standing Shoulder External Rotation with Resistance  - 2 x daily - 7 x weekly - 1-2 sets - 10 reps - Supine Bridge  - 2 x daily - 7 x weekly - 1-3 sets - 10 reps - Standing Shoulder Row with Anchored Resistance  - 1 x daily - 7 x weekly - 2 sets - 10 reps - Shoulder extension with resistance - Neutral  - 1 x daily - 7 x weekly - 2 sets - 10 reps - Standing Shoulder Horizontal Abduction with Resistance  - 1 x daily - 7 x weekly - 2 sets - 10 reps  ASSESSMENT:  CLINICAL IMPRESSION: Pt has been more compliant with HEP since last visit.    PT provided osteoporosis education today regarding do/don't activities.  Pt did well with exercises today  and PT provided verbal cues and supervision for technique.  Patient will benefit from skilled PT to address the below impairments and improve overall function.    OBJECTIVE IMPAIRMENTS: Abnormal gait, decreased balance, decreased endurance, decreased mobility, decreased strength, increased muscle spasms, improper body mechanics, postural dysfunction, and pain.   ACTIVITY LIMITATIONS: standing, stairs, transfers, and locomotion level  PARTICIPATION LIMITATIONS: meal prep, community activity, and occupation  PERSONAL FACTORS: Social background, Time since onset of injury/illness/exacerbation, and 1-2 comorbidities: osteoporosis, lumbar DDD  are also affecting patient's functional outcome.   REHAB POTENTIAL: Good  CLINICAL DECISION MAKING: Stable/uncomplicated  EVALUATION COMPLEXITY: Low   GOALS: Goals reviewed with patient? Yes  SHORT TERM GOALS: Target date: 10/11/23    Patient will verbally understand ways to strengthen postural musculature. Baseline: limited attendance so slow progression of HEP Goal status: partially met   2.  Initiate HEP focusing on managing osteoporosis postural deficits and strength deficits. Baseline:  Goal status: MET  3.  Perform 5x sit to stand in < or = to 14 seconds  Baseline: 15 seconds (09/16/23) Goal status: in progress   4.  Report > or = to 20% reduction in LBP with standing and walking  Baseline:  Goal status:  INITIAL    LONG TERM GOALS: Target date: 11/01/23    Patient will verbally understand ways to manage osteoporosis with diet and correct to reduce strain postures on spine. Baseline:  Goal status: INITIAL  2.  Patient will verbally understand correct body mechanics for home and work tasks to decrease strain on spine. Baseline:  Goal status: INITIAL  3.  Perform 5x sit to stand in < or = to 13 seconds to reduce falls risk Baseline: 15 seconds (09/16/23) Goal status: In progress   4.  Patient can verbally understand the dos  and don'ts of osteoporosis management. Baseline:  Goal status: INITIAL  5.  Report > or = to 30% reduction in LBP with standing and walking for work tasks  Baseline: up to 7/10 with static standing only (09/16/23) Goal status: In progress   PLAN:  PT FREQUENCY: 1x/week  PT DURATION: 6 weeks  PLANNED INTERVENTIONS: 97110-Therapeutic exercises, 97530- Therapeutic activity, [id number]- Neuromuscular re-education, 97535- Self Care, [id number]- Manual therapy, [id number]- Aquatic Therapy, 97014- Electrical stimulation (unattended), [phone]- Electrical stimulation (manual), Patient/Family education, Balance training, Stair training, Taping, Dry Needling, Vestibular training, Cryotherapy, and Moist heat.  PLAN FOR NEXT SESSION: address balance, alignment, postural strength  Lorrene Reid, PT 09/23/23 3:28 PM   [facility] [address], [address] [place], [place] [address] Phone # [phone] Fax [phone]

## 2023-09-30 ENCOUNTER — Ambulatory Visit: Payer: Medicare Other

## 2023-09-30 DIAGNOSIS — M6281 Muscle weakness (generalized): Secondary | ICD-10-CM | POA: Diagnosis not present

## 2023-09-30 DIAGNOSIS — R293 Abnormal posture: Secondary | ICD-10-CM | POA: Diagnosis not present

## 2023-09-30 DIAGNOSIS — R2689 Other abnormalities of gait and mobility: Secondary | ICD-10-CM | POA: Diagnosis not present

## 2023-09-30 DIAGNOSIS — M5459 Other low back pain: Secondary | ICD-10-CM

## 2023-09-30 NOTE — Therapy (Signed)
 OUTPATIENT PHYSICAL THERAPY THORACOLUMBAR TREATMENT   Patient Name: Michelle Wilcox MRN: [id number] DOB:Jan 09, 1946, 78 y.o., female Today's Date: 09/30/2023  END OF SESSION:  PT End of Session - 09/30/23 1228     Visit Number 6    Date for PT Re-Evaluation 11/01/23    Authorization Type Medicare B    Progress Note Due on Visit 10    PT Start Time 1147    PT Stop Time 1226    PT Time Calculation (min) 39 min    Activity Tolerance Patient tolerated treatment well    Behavior During Therapy WFL for tasks assessed/performed                  Past Medical History:  Diagnosis Date   Anxiety    hx of   Asthma    at times   CHF (congestive heart failure) (HCC)    Depression    hx of   Dyspnea    Giant cell arteritis (HCC)    Gouty arthropathy    Hemolytic anemia (HCC)    Hyperlipidemia    Hypertension    Irregular heart beat    extra beat   Migraines    Polymyalgia (HCC)    Polymyalgia rheumatica (HCC)    Temporal arteritis (HCC)    Past Surgical History:  Procedure Laterality Date   BARTHOLIN GLAND CYST EXCISION     non ca   BREAST BIOPSY Right    EYE MUSCLE SURGERY     as a child 61 yrs old   KNEE SURGERY  2004   rt knee   LAPAROSCOPIC SPLENECTOMY N/A 06/18/2018   Procedure: LAPAROSCOPIC SPLENECTOMY ERAS PATHWAY;  Surgeon: Ayesha Lente, MD;  Location: [facility];  Service: General;  Laterality: N/A;   toncil     TONSILLECTOMY     as a child   Patient Active Problem List   Diagnosis Date Noted   Primary localized osteoarthrosis of multiple sites 06/15/2022   Diastolic dysfunction without heart failure 03/05/2018   DOE (dyspnea on exertion) 03/05/2018   Preop cardiovascular exam 03/05/2018   Mild reactive airways disease 08/04/2017   Volume overload 08/03/2017   Hypokalemia 08/03/2017   Essential hypertension 08/03/2017   Hyperlipidemia 08/03/2017   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   Hypersensitivity reaction 11/20/2016    Drusen (degenerative) of retina, bilateral 05/22/2016   Nuclear cataract 05/22/2016   Retinal hemorrhage of left eye 05/22/2016   Temporal arteritis (HCC) 05/22/2016   Hemolytic anemia (HCC) 03/19/2016   Breast mass, right 07/01/2012    PCP: [name], MD  REFERRING PROVIDER: [name], MD  REFERRING DIAG: M81.0 (ICD-10-CM) - Age-related osteoporosis without current pathological fracture   Rationale for Evaluation and Treatment: Rehabilitation  THERAPY DIAG:  Abnormal posture  Muscle weakness (generalized)  Other low back pain  Other abnormalities of gait and mobility  ONSET DATE: Fall June 2024, diagnosed with osteoporosis 05/2023  SUBJECTIVE:  SUBJECTIVE STATEMENT: I am having more pain today in my back and neck.    Eval: Pt is a 78 y.o. female who presents to PT with age related osteoporosis.  Pt had a fall in June 2024 and had PT for several months following.  She fell forward and landed on her head when at work.  She was diagnosed with osteoporosis in September 2024.  Pt uses a cane for community ambulation.   PERTINENT HISTORY:  Osteoporosis, CHF, HTN, polymyalgia, lumbar DDD  PAIN: 09/30/23 Are you having pain? No and Yes: NPRS scale: 4/10 (neck), low back 5/10 Pain location: low back/neck Pain description: aching, severe  Aggravating factors: standing in one spot and not moving Relieving factors: sitting for a short period, moving around , stretching   PRECAUTIONS: Other: osteoporosis  RED FLAGS: None   WEIGHT BEARING RESTRICTIONS: No  FALLS:  Has patient fallen in last 6 months? Yes. 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  DIAGNOSTIC FINDINGS:  03/2023: T score at radius: -2.6 Femur -2.1  SCREENING FOR RED FLAGS: Bowel or bladder incontinence: No Spinal tumors: No Cauda equina syndrome: No Compression fracture: No Abdominal aneurysm: No  COGNITION: Overall cognitive status: Within functional limits for tasks assessed     SENSATION: WFL   POSTURE: rounded shoulders, forward head, and weight shift left  PALPATION: NA  LOWER EXTREMITY MMT:   Knee strength: Lt 4+/5, Rt 4/5, hip 4-/5 bil  FUNCTIONAL TESTS:  5 times sit to stand: 17.49  09/16/23: 5x sit to stand: 15 seconds without hands  GAIT: Distance walked: 75 Assistive device utilized: None Level of assistance: Complete Independence Comments: reduced trunk rotation, forward head, antalgic with reduced time on Rt LE  TODAY'S TREATMENT:                                                                                                                              DATE:  09/30/23   Nustep L4 x 8 min-PT present to discuss progress Seated hamstring stretch 2 x 30 sec bilateral  Supine trunk rotation 3x20 Supine ball squeeze: 5" hold 2x10 Standing shoulder row & extension with red TB 2 x 10 Sit to stand no UE support x10 with 5# kettlebell Shoulder shrugs and rolls  Alternating step-taps: 6" step 2x10   09/23/23   Nustep L4 x 7 min-PT present to discuss progress Do/Don't osteoporosis education Seated hamstring stretch 2 x 30 sec bilateral  Standing shoulder row & extension with red TB 2 x 10 Sit to  stand no UE support 2x10 with 5# kettlebell Walk around clinic with farmer's carry: 5#- 2 laps Alternating  step-taps: 6" step 2x10   09/16/23   Nustep L4 x 6 min-PT present to discuss progress Stability ball stretch forwards x 8 Leg press 35# 2x10 seat 7 Seated hamstring stretch 2 x 30 sec bilateral  Standing shoulder row & extension with red TB 2 x 10 Sit to stand no UE support 2x10 with 5# kettlebell Walk around clinic with farmer's carry: 5#- 2 laps  Seated ball roll outs-performed between laps of farmer's carry  PATIENT EDUCATION:  Education details: Access Code: [email or web address], osteoporosis information  Person educated: Patient Education method: Explanation, Demonstration, and Handouts Education comprehension: verbalized understanding and returned demonstration  HOME EXERCISE PROGRAM: Access Code: [email or web address] URL: [email or web address] Date: 08/19/2023 Prepared by: Penelope Bowie  Exercises - Standing Hip Abduction with Counter Support  - 1 x daily - 7 x weekly - 1-2 sets - 5-10 reps - Standing Hip Extension with Counter Support  - 1 x daily - 7 x weekly - 1-2 sets - 5-10 reps - Sit to Stand Without Arm Support  - 1 x daily - 7 x weekly - 1-2 sets - 10 reps - Standing Shoulder External Rotation with Resistance  - 2 x daily - 7 x weekly - 1-2 sets - 10 reps - Supine Bridge  - 2 x daily - 7 x weekly - 1-3 sets - 10 reps - Standing Shoulder Row with Anchored Resistance  - 1 x daily - 7 x weekly - 2 sets - 10 reps - Shoulder extension with resistance - Neutral  - 1 x daily - 7 x weekly - 2 sets - 10 reps - Standing Shoulder Horizontal Abduction with Resistance  - 1 x daily - 7 x weekly - 2 sets - 10 reps  ASSESSMENT:  CLINICAL IMPRESSION: Pt arrived with some increased neck and low back pain.  Pt was agreeable to exercises today and PT monitored for pain.  Pt reports that her pain was reduced after supine hip adduction with TA activation.  PT advised pt to engage her abdominals if she were to have LBP with functional tasks.  She reported reduced pain after session today and agreed  to resume exercises. Patient will benefit from skilled PT to address the below impairments and improve overall function.    OBJECTIVE IMPAIRMENTS: Abnormal gait, decreased balance, decreased endurance, decreased mobility, decreased strength, increased muscle spasms, improper body mechanics, postural dysfunction, and pain.   ACTIVITY LIMITATIONS: standing, stairs, transfers, and locomotion level  PARTICIPATION LIMITATIONS: meal prep, community activity, and occupation  PERSONAL FACTORS: Social background, Time since onset of injury/illness/exacerbation, and 1-2 comorbidities: osteoporosis, lumbar DDD  are also affecting patient's functional outcome.   REHAB POTENTIAL: Good  CLINICAL DECISION MAKING: Stable/uncomplicated  EVALUATION COMPLEXITY: Low   GOALS: Goals reviewed with patient? Yes  SHORT TERM GOALS: Target date: 10/11/23    Patient will verbally understand ways to strengthen postural musculature. Baseline: limited attendance so slow progression of HEP Goal status: partially met   2.  Initiate HEP focusing on managing osteoporosis postural deficits and strength deficits. Baseline:  Goal status: MET  3.  Perform 5x sit to stand in < or = to 14 seconds  Baseline: 15 seconds (09/16/23) Goal status: in progress   4.  Report > or = to 20% reduction in LBP with standing and walking  Baseline:  Goal status: INITIAL    LONG TERM GOALS: Target date: 11/01/23  Patient will verbally understand ways to manage osteoporosis with diet and correct to reduce strain postures on spine. Baseline:  Goal status: INITIAL  2.  Patient will verbally understand correct body mechanics for home and work tasks to decrease strain on spine. Baseline:  Goal status: INITIAL  3.  Perform 5x sit to stand in < or = to 13 seconds to reduce falls risk Baseline: 15 seconds (09/16/23) Goal status: In progress   4.  Patient can verbally understand the dos and don'ts of osteoporosis  management. Baseline:  Goal status: INITIAL  5.  Report > or = to 30% reduction in LBP with standing and walking for work tasks  Baseline: up to 7/10 with static standing only (09/16/23) Goal status: In progress   PLAN:  PT FREQUENCY: 1x/week  PT DURATION: 6 weeks  PLANNED INTERVENTIONS: 97110-Therapeutic exercises, 97530- Therapeutic activity, [id number]- Neuromuscular re-education, 97535- Self Care, 91478- Manual therapy, [id number]- Aquatic Therapy, 97014- Electrical stimulation (unattended), [phone]- Electrical stimulation (manual), Patient/Family education, Balance training, Stair training, Taping, Dry Needling, Vestibular training, Cryotherapy, and Moist heat.  PLAN FOR NEXT SESSION: address balance, alignment, postural strength  Luella Sager, PT 09/30/23 12:29 PM   [facility] [address] [place], [place] [address] Phone # [phone] Fax [phone]

## 2023-10-02 ENCOUNTER — Telehealth: Payer: Self-pay | Admitting: *Deleted

## 2023-10-02 MED ORDER — PREDNISONE 5 MG PO TABS: 5.0000 mg | ORAL_TABLET | Freq: Every day | ORAL | 7 refills | Status: DC

## 2023-10-02 NOTE — Telephone Encounter (Signed)
Called to request refill on Prednisone 5 mg tablet and per new insurance needs #30 day supply to local pharmacy. Also asking when her next appointment is with Dr. Truett Perna. Called back and LVM with appointment date/time in September and that script being sent to [facility] on [address].

## 2023-10-07 ENCOUNTER — Ambulatory Visit: Payer: Medicare Other

## 2023-10-07 DIAGNOSIS — R293 Abnormal posture: Secondary | ICD-10-CM | POA: Diagnosis not present

## 2023-10-07 DIAGNOSIS — M5459 Other low back pain: Secondary | ICD-10-CM

## 2023-10-07 DIAGNOSIS — M6281 Muscle weakness (generalized): Secondary | ICD-10-CM | POA: Diagnosis not present

## 2023-10-07 DIAGNOSIS — F4310 Post-traumatic stress disorder, unspecified: Secondary | ICD-10-CM | POA: Diagnosis not present

## 2023-10-07 DIAGNOSIS — R2689 Other abnormalities of gait and mobility: Secondary | ICD-10-CM

## 2023-10-07 DIAGNOSIS — E785 Hyperlipidemia, unspecified: Secondary | ICD-10-CM | POA: Diagnosis not present

## 2023-10-07 DIAGNOSIS — I10 Essential (primary) hypertension: Secondary | ICD-10-CM | POA: Diagnosis not present

## 2023-10-07 DIAGNOSIS — M810 Age-related osteoporosis without current pathological fracture: Secondary | ICD-10-CM | POA: Diagnosis not present

## 2023-10-07 NOTE — Therapy (Signed)
 OUTPATIENT PHYSICAL THERAPY THORACOLUMBAR TREATMENT   Patient Name: Michelle Wilcox MRN: [id number] DOB:1946/05/22, 78 y.o., female Today's Date: 10/07/2023  END OF SESSION:  PT End of Session - 10/07/23 1658     Visit Number 7    Date for PT Re-Evaluation 11/01/23    Authorization Type Medicare B    Progress Note Due on Visit 10    PT Start Time 1617    PT Stop Time 1659    PT Time Calculation (min) 42 min    Activity Tolerance Patient tolerated treatment well    Behavior During Therapy WFL for tasks assessed/performed                   Past Medical History:  Diagnosis Date   Anxiety    hx of   Asthma    at times   CHF (congestive heart failure) (HCC)    Depression    hx of   Dyspnea    Giant cell arteritis (HCC)    Gouty arthropathy    Hemolytic anemia (HCC)    Hyperlipidemia    Hypertension    Irregular heart beat    extra beat   Migraines    Polymyalgia (HCC)    Polymyalgia rheumatica (HCC)    Temporal arteritis (HCC)    Past Surgical History:  Procedure Laterality Date   BARTHOLIN GLAND CYST EXCISION     non ca   BREAST BIOPSY Right    EYE MUSCLE SURGERY     as a child 79 yrs old   KNEE SURGERY  2004   rt knee   LAPAROSCOPIC SPLENECTOMY N/A 06/18/2018   Procedure: LAPAROSCOPIC SPLENECTOMY ERAS PATHWAY;  Surgeon: Glenna Fellows, MD;  Location: [facility];  Service: General;  Laterality: N/A;   toncil     TONSILLECTOMY     as a child   Patient Active Problem List   Diagnosis Date Noted   Primary localized osteoarthrosis of multiple sites 06/15/2022   Diastolic dysfunction without heart failure 03/05/2018   DOE (dyspnea on exertion) 03/05/2018   Preop cardiovascular exam 03/05/2018   Mild reactive airways disease 08/04/2017   Volume overload 08/03/2017   Hypokalemia 08/03/2017   Essential hypertension 08/03/2017   Hyperlipidemia 08/03/2017   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   Hypersensitivity reaction 11/20/2016    Drusen (degenerative) of retina, bilateral 05/22/2016   Nuclear cataract 05/22/2016   Retinal hemorrhage of left eye 05/22/2016   Temporal arteritis (HCC) 05/22/2016   Hemolytic anemia (HCC) 03/19/2016   Breast mass, right 07/01/2012    PCP: Gweneth Dimitri, MD  REFERRING PROVIDER: Gweneth Dimitri, MD  REFERRING DIAG: M81.0 (ICD-10-CM) - Age-related osteoporosis without current pathological fracture   Rationale for Evaluation and Treatment: Rehabilitation  THERAPY DIAG:  Abnormal posture  Muscle weakness (generalized)  Other low back pain  Other abnormalities of gait and mobility  ONSET DATE: Fall June 2024, diagnosed with osteoporosis 05/2023  SUBJECTIVE:  SUBJECTIVE STATEMENT: Neck and back are feeling better overall. I carried a heavy Christmas tree out to the dumpster at my house today so I am tired.    Eval: Pt is a 78 y.o. female who presents to PT with age related osteoporosis.  Pt had a fall in June 2024 and had PT for several months following.  She fell forward and landed on her head when at work.  She was diagnosed with osteoporosis in September 2024.  Pt uses a cane for community ambulation.   PERTINENT HISTORY:  Osteoporosis, CHF, HTN, polymyalgia, lumbar DDD  PAIN: 10/07/23 Are you having pain? No and Yes: NPRS scale: 0/10 (neck), low back 0/10 Pain location: low back/neck Pain description: aching, severe  Aggravating factors: standing in one spot and not moving Relieving factors: sitting for a short period, moving around , stretching   PRECAUTIONS: Other: osteoporosis  RED FLAGS: None   WEIGHT BEARING RESTRICTIONS: No  FALLS:  Has patient fallen in last 6 months? Yes. 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  DIAGNOSTIC FINDINGS:  03/2023: T score at radius: -2.6 Femur -2.1  SCREENING FOR RED FLAGS: Bowel or bladder incontinence: No Spinal tumors: No Cauda equina syndrome: No Compression fracture: No Abdominal aneurysm: No  COGNITION: Overall cognitive status: Within functional limits for tasks assessed     SENSATION: WFL   POSTURE: rounded shoulders, forward head, and weight shift left  PALPATION: NA  LOWER EXTREMITY MMT:   Knee strength: Lt 4+/5, Rt 4/5, hip 4-/5 bil  FUNCTIONAL TESTS:  5 times sit to stand: 17.49  09/16/23: 5x sit to stand: 15 seconds without hands  GAIT: Distance walked: 75 Assistive device utilized: None Level of assistance: Complete Independence Comments: reduced trunk rotation, forward head, antalgic with reduced time on Rt LE  TODAY'S TREATMENT:                                                                                                                              DATE:  10/07/23   Nustep L4 x 8 min-PT present to discuss progress Standing on balance pad: hip to shoulder diagonal with 5# kettlebell 2x10 bil each  Seated hamstring stretch 2 x 30 sec bilateral  Holding 5# kettle bell on 1 side: marching x10 each side Sit to stand no UE support 2x10 with 5# kettlebell- seated on balance pad Box stepping: 8 laps each direction  Farmer's carry: 5# kettle bell on each side- 1 lap each hand   09/30/23   Nustep L4  x 8 min-PT present to discuss progress Seated hamstring stretch 2 x 30 sec bilateral  Supine trunk rotation 3x20 Supine  ball squeeze: 5" hold 2x10 Standing shoulder row & extension with red TB 2 x 10 Sit to stand no UE support x10 with 5# kettlebell Shoulder shrugs and rolls  Alternating step-taps: 6" step 2x10   09/23/23   Nustep L4 x 7 min-PT present to discuss progress Do/Don't osteoporosis education Seated hamstring stretch 2 x 30 sec bilateral  Standing shoulder row & extension with red TB 2 x 10 Sit to stand no UE support 2x10 with 5# kettlebell Walk around clinic with farmer's carry: 5#- 2 laps Alternating step-taps: 6" step 2x10    PATIENT EDUCATION:  Education details: Access Code: [email or web address], osteoporosis information  Person educated: Patient Education method: Explanation, Demonstration, and Handouts Education comprehension: verbalized understanding and returned demonstration  HOME EXERCISE PROGRAM: Access Code: [email or web address] URL: [email or web address] Date: 08/19/2023 Prepared by: Claude Manges  Exercises - Standing Hip Abduction with Counter Support  - 1 x daily - 7 x weekly - 1-2 sets - 5-10 reps - Standing Hip Extension with Counter Support  - 1 x daily - 7 x weekly - 1-2 sets - 5-10 reps - Sit to Stand Without Arm Support  - 1 x daily - 7 x weekly - 1-2 sets - 10 reps - Standing Shoulder External Rotation with Resistance  - 2 x daily - 7 x weekly - 1-2 sets - 10 reps - Supine Bridge  - 2 x daily - 7 x weekly - 1-3 sets - 10 reps - Standing Shoulder Row with Anchored Resistance  - 1 x daily - 7 x weekly - 2 sets - 10 reps - Shoulder extension with resistance - Neutral  - 1 x daily - 7 x weekly - 2 sets - 10 reps - Standing Shoulder Horizontal Abduction with Resistance  - 1 x daily - 7 x weekly - 2 sets - 10 reps  ASSESSMENT:  CLINICAL IMPRESSION: Pt reports that her balance feels improved since the start of care.  She denies any back pain today.  She is making body mechanics modifications with daily tasks for spinal protection.  Pt did well with advancement of balance  exercises today. PT provided supervision and cueing for alignment and technique today. She was fatigued today due to lifting a tree out of her house today.  Patient will benefit from skilled PT to address the below impairments and improve overall function.    OBJECTIVE IMPAIRMENTS: Abnormal gait, decreased balance, decreased endurance, decreased mobility, decreased strength, increased muscle spasms, improper body mechanics, postural dysfunction, and pain.   ACTIVITY LIMITATIONS: standing, stairs, transfers, and locomotion level  PARTICIPATION LIMITATIONS: meal prep, community activity, and occupation  PERSONAL FACTORS: Social background, Time since onset of injury/illness/exacerbation, and 1-2 comorbidities: osteoporosis, lumbar DDD  are also affecting patient's functional outcome.   REHAB POTENTIAL: Good  CLINICAL DECISION MAKING: Stable/uncomplicated  EVALUATION COMPLEXITY: Low   GOALS: Goals reviewed with patient? Yes  SHORT TERM GOALS: Target date: 10/11/23    Patient will verbally understand ways to strengthen postural musculature. Baseline: independent in current HEP for strength (2/17/25_ Goal status: MET   2.  Initiate HEP focusing on managing osteoporosis postural deficits and strength deficits. Baseline:  Goal status: MET  3.  Perform 5x sit to stand in < or = to 14 seconds  Baseline: 15 seconds (09/16/23) Goal status: in progress   4.  Report > or = to 20% reduction  in LBP with standing and walking  Baseline:  Goal status: INITIAL    LONG TERM GOALS: Target date: 11/01/23    Patient will verbally understand ways to manage osteoporosis with diet and correct to reduce strain postures on spine. Baseline:  Goal status: INITIAL  2.  Patient will verbally understand correct body mechanics for home and work tasks to decrease strain on spine. Baseline:  Goal status: INITIAL  3.  Perform 5x sit to stand in < or = to 13 seconds to reduce falls risk Baseline: 15  seconds (09/16/23) Goal status: In progress   4.  Patient can verbally understand the dos and don'ts of osteoporosis management. Baseline: 10/07/23 Goal status:MET   5.  Report > or = to 30% reduction in LBP with standing and walking for work tasks  Baseline: up to 7/10 with static standing only (09/16/23) Goal status: In progress   PLAN:  PT FREQUENCY: 1x/week  PT DURATION: 6 weeks  PLANNED INTERVENTIONS: 97110-Therapeutic exercises, 97530- Therapeutic activity, [id number]- Neuromuscular re-education, 97535- Self Care, 16109- Manual therapy, [id number]- Aquatic Therapy, 97014- Electrical stimulation (unattended), [phone]- Electrical stimulation (manual), Patient/Family education, Balance training, Stair training, Taping, Dry Needling, Vestibular training, Cryotherapy, and Moist heat.  PLAN FOR NEXT SESSION: address balance, alignment, postural strength  Lorrene Reid, PT 10/07/23 5:00 PM   [facility] [address], [address] [place], [place] [address] Phone # [phone] Fax [phone]

## 2023-10-14 ENCOUNTER — Ambulatory Visit: Payer: Medicare Other

## 2023-10-14 DIAGNOSIS — R2689 Other abnormalities of gait and mobility: Secondary | ICD-10-CM | POA: Diagnosis not present

## 2023-10-14 DIAGNOSIS — R293 Abnormal posture: Secondary | ICD-10-CM | POA: Diagnosis not present

## 2023-10-14 DIAGNOSIS — M6281 Muscle weakness (generalized): Secondary | ICD-10-CM

## 2023-10-14 DIAGNOSIS — M5459 Other low back pain: Secondary | ICD-10-CM

## 2023-10-14 DIAGNOSIS — M315 Giant cell arteritis with polymyalgia rheumatica: Secondary | ICD-10-CM | POA: Diagnosis not present

## 2023-10-14 DIAGNOSIS — M1991 Primary osteoarthritis, unspecified site: Secondary | ICD-10-CM | POA: Diagnosis not present

## 2023-10-14 DIAGNOSIS — M353 Polymyalgia rheumatica: Secondary | ICD-10-CM | POA: Diagnosis not present

## 2023-10-14 DIAGNOSIS — D591 Autoimmune hemolytic anemia, unspecified: Secondary | ICD-10-CM | POA: Diagnosis not present

## 2023-10-14 DIAGNOSIS — Z79899 Other long term (current) drug therapy: Secondary | ICD-10-CM | POA: Diagnosis not present

## 2023-10-14 NOTE — Therapy (Signed)
 OUTPATIENT PHYSICAL THERAPY THORACOLUMBAR TREATMENT   Patient Name: Michelle Wilcox MRN: [id number] DOB:11-Jun-1946, 78 y.o., female Today's Date: 10/14/2023  END OF SESSION:  PT End of Session - 10/14/23 1318     Visit Number 8    Date for PT Re-Evaluation 11/01/23    Authorization Type Medicare B    Progress Note Due on Visit 10    PT Start Time 1238   late   PT Stop Time 1316    PT Time Calculation (min) 38 min    Activity Tolerance Patient tolerated treatment well    Behavior During Therapy WFL for tasks assessed/performed                    Past Medical History:  Diagnosis Date   Anxiety    hx of   Asthma    at times   CHF (congestive heart failure) (HCC)    Depression    hx of   Dyspnea    Giant cell arteritis (HCC)    Gouty arthropathy    Hemolytic anemia (HCC)    Hyperlipidemia    Hypertension    Irregular heart beat    extra beat   Migraines    Polymyalgia (HCC)    Polymyalgia rheumatica (HCC)    Temporal arteritis (HCC)    Past Surgical History:  Procedure Laterality Date   BARTHOLIN GLAND CYST EXCISION     non ca   BREAST BIOPSY Right    EYE MUSCLE SURGERY     as a child 17 yrs old   KNEE SURGERY  2004   rt knee   LAPAROSCOPIC SPLENECTOMY N/A 06/18/2018   Procedure: LAPAROSCOPIC SPLENECTOMY ERAS PATHWAY;  Surgeon: Glenna Fellows, MD;  Location: [facility];  Service: General;  Laterality: N/A;   toncil     TONSILLECTOMY     as a child   Patient Active Problem List   Diagnosis Date Noted   Primary localized osteoarthrosis of multiple sites 06/15/2022   Diastolic dysfunction without heart failure 03/05/2018   DOE (dyspnea on exertion) 03/05/2018   Preop cardiovascular exam 03/05/2018   Mild reactive airways disease 08/04/2017   Volume overload 08/03/2017   Hypokalemia 08/03/2017   Essential hypertension 08/03/2017   Hyperlipidemia 08/03/2017   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   Hypersensitivity reaction  11/20/2016   Drusen (degenerative) of retina, bilateral 05/22/2016   Nuclear cataract 05/22/2016   Retinal hemorrhage of left eye 05/22/2016   Temporal arteritis (HCC) 05/22/2016   Hemolytic anemia (HCC) 03/19/2016   Breast mass, right 07/01/2012    PCP: Gweneth Dimitri, MD  REFERRING PROVIDER: Gweneth Dimitri, MD  REFERRING DIAG: M81.0 (ICD-10-CM) - Age-related osteoporosis without current pathological fracture   Rationale for Evaluation and Treatment: Rehabilitation  THERAPY DIAG:  Abnormal posture  Muscle weakness (generalized)  Other low back pain  Other abnormalities of gait and mobility  ONSET DATE: Fall June 2024, diagnosed with osteoporosis 05/2023  SUBJECTIVE:  SUBJECTIVE STATEMENT: Feeling good today.  I've had a lot of appointments  Eval: Pt is a 78 y.o. female who presents to PT with age related osteoporosis.  Pt had a fall in June 2024 and had PT for several months following.  She fell forward and landed on her head when at work.  She was diagnosed with osteoporosis in September 2024.  Pt uses a cane for community ambulation.   PERTINENT HISTORY:  Osteoporosis, CHF, HTN, polymyalgia, lumbar DDD  PAIN: 10/07/23 Are you having pain? No and Yes: NPRS scale: 0/10 (neck), low back 0/10 Pain location: low back/neck Pain description: aching, severe  Aggravating factors: standing in one spot and not moving Relieving factors: sitting for a short period, moving around , stretching   PRECAUTIONS: Other: osteoporosis  RED FLAGS: None   WEIGHT BEARING RESTRICTIONS: No  FALLS:  Has patient fallen in last 6 months? Yes. 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  DIAGNOSTIC FINDINGS:  03/2023: T score at radius: -2.6 Femur -2.1  SCREENING FOR RED FLAGS: Bowel or bladder incontinence: No Spinal tumors: No Cauda equina syndrome: No Compression fracture: No Abdominal aneurysm: No  COGNITION: Overall cognitive status: Within functional limits for tasks assessed     SENSATION: WFL   POSTURE: rounded shoulders, forward head, and weight shift left  PALPATION: NA  LOWER EXTREMITY MMT:   Knee strength: Lt 4+/5, Rt 4/5, hip 4-/5 bil  FUNCTIONAL TESTS:  5 times sit to stand: 17.49  09/16/23: 5x sit to stand: 15 seconds without hands  GAIT: Distance walked: 75 Assistive device utilized: None Level of assistance: Complete Independence Comments: reduced trunk rotation, forward head, antalgic with reduced time on Rt LE  TODAY'S TREATMENT:                                                                                                                              DATE:  10/14/23   Nustep L4 x 6 min-PT present to discuss progress Standing on balance pad: hip to shoulder diagonal with 5# kettlebell 2x10 bil each  Seated hamstring stretch 2 x 30 sec bilateral  Holding 5# kettle bell on 1 side: marching x10 each side Sit to stand no UE support 2x10 with 5# kettlebell- seated on balance pad Box stepping: 8 laps each direction  Farmer's carry: 8# kettle bell on each side- 1 lap each hand  Hurdles at barre: forward and lateral with step to pattern.  Min UE support at barre Tandem stance: red band shoulder extension 2x10  10/07/23   Nustep L4 x 8 min-PT present to discuss progress Standing  on balance pad: hip to shoulder diagonal with 5# kettlebell 2x10 bil each  Seated hamstring stretch 2 x 30 sec bilateral  Holding 5# kettle bell on 1 side: marching x10 each side Sit to stand no UE support 2x10 with 5# kettlebell- seated on balance pad Box stepping: 8 laps each direction  Farmer's carry: 5# kettle bell on each side- 1 lap each hand   09/30/23   Nustep L4 x 8 min-PT present to discuss progress Seated hamstring stretch 2 x 30 sec bilateral  Supine trunk rotation 3x20 Supine ball squeeze: 5" hold 2x10 Standing shoulder row & extension with red TB 2 x 10 Sit to stand no UE support x10 with 5# kettlebell Shoulder shrugs and rolls  Alternating step-taps: 6" step 2x10    PATIENT EDUCATION:  Education details: Access Code: [email or web address], osteoporosis information  Person educated: Patient Education method: Explanation, Demonstration, and Handouts Education comprehension: verbalized understanding and returned demonstration  HOME EXERCISE PROGRAM: Access Code: [email or web address] URL: [email or web address] Date: 08/19/2023 Prepared by: Claude Manges  Exercises - Standing Hip Abduction with Counter Support  - 1 x daily - 7 x weekly - 1-2 sets - 5-10 reps - Standing Hip Extension with Counter Support  - 1 x daily - 7 x weekly - 1-2 sets - 5-10 reps - Sit to Stand Without Arm Support  - 1 x daily - 7 x weekly - 1-2 sets - 10 reps - Standing Shoulder External Rotation with Resistance  - 2 x daily - 7 x weekly - 1-2 sets - 10 reps - Supine Bridge  - 2 x daily - 7 x weekly - 1-3 sets - 10 reps - Standing Shoulder Row with Anchored Resistance  - 1 x daily - 7 x weekly - 2 sets - 10 reps - Shoulder extension with resistance - Neutral  - 1 x daily - 7 x weekly - 2 sets - 10 reps - Standing Shoulder Horizontal Abduction with Resistance  - 1 x daily - 7 x weekly - 2 sets - 10 reps  ASSESSMENT:  CLINICAL IMPRESSION: Pt reports that her balance feels improved since the start of care  and is able to tolerate higher level tasks in the clinic.  She is making body mechanics modifications with daily tasks for spinal protection.   PT provided supervision and cueing for alignment and technique today.   Patient will benefit from skilled PT to address the below impairments and improve overall function.    OBJECTIVE IMPAIRMENTS: Abnormal gait, decreased balance, decreased endurance, decreased mobility, decreased strength, increased muscle spasms, improper body mechanics, postural dysfunction, and pain.   ACTIVITY LIMITATIONS: standing, stairs, transfers, and locomotion level  PARTICIPATION LIMITATIONS: meal prep, community activity, and occupation  PERSONAL FACTORS: Social background, Time since onset of injury/illness/exacerbation, and 1-2 comorbidities: osteoporosis, lumbar DDD  are also affecting patient's functional outcome.   REHAB POTENTIAL: Good  CLINICAL DECISION MAKING: Stable/uncomplicated  EVALUATION COMPLEXITY: Low   GOALS: Goals reviewed with patient? Yes  SHORT TERM GOALS: Target date: 10/11/23    Patient will verbally understand ways to strengthen postural musculature. Baseline: independent in current HEP for strength (2/17/25_ Goal status: MET   2.  Initiate HEP focusing on managing osteoporosis postural deficits and strength deficits. Baseline:  Goal status: MET  3.  Perform 5x sit to stand in < or = to 14 seconds  Baseline: 15 seconds (09/16/23) Goal status: in progress  4.  Report > or = to 20% reduction in LBP with standing and walking  Baseline:  Goal status: MET    LONG TERM GOALS: Target date: 11/01/23    Patient will verbally understand ways to manage osteoporosis with diet and correct to reduce strain postures on spine. Baseline:  Goal status: in progress   2.  Patient will verbally understand correct body mechanics for home and work tasks to decrease strain on spine. Baseline:  Goal status: INITIAL  3.  Perform 5x sit to stand in  < or = to 13 seconds to reduce falls risk Baseline: 15 seconds (09/16/23) Goal status: In progress   4.  Patient can verbally understand the dos and don'ts of osteoporosis management. Baseline: 10/07/23 Goal status:MET   5.  Report > or = to 30% reduction in LBP with standing and walking for work tasks  Baseline: up to 7/10 with static standing only , relief after sitting for 30 seconds Goal status: In progress   PLAN:  PT FREQUENCY: 1x/week  PT DURATION: 6 weeks  PLANNED INTERVENTIONS: 97110-Therapeutic exercises, 97530- Therapeutic activity, [id number]- Neuromuscular re-education, 97535- Self Care, 16109- Manual therapy, [id number]- Aquatic Therapy, 97014- Electrical stimulation (unattended), [phone]- Electrical stimulation (manual), Patient/Family education, Balance training, Stair training, Taping, Dry Needling, Vestibular training, Cryotherapy, and Moist heat.  PLAN FOR NEXT SESSION: address balance, alignment, postural strength  Lorrene Reid, PT 10/14/23 1:20 PM   [facility] [address] [place], [place] [address] Phone # [phone] Fax [phone]

## 2023-10-21 ENCOUNTER — Ambulatory Visit: Payer: Medicare Other | Attending: Family Medicine

## 2023-10-21 DIAGNOSIS — M6281 Muscle weakness (generalized): Secondary | ICD-10-CM | POA: Insufficient documentation

## 2023-10-21 DIAGNOSIS — R262 Difficulty in walking, not elsewhere classified: Secondary | ICD-10-CM | POA: Diagnosis not present

## 2023-10-21 DIAGNOSIS — R2689 Other abnormalities of gait and mobility: Secondary | ICD-10-CM | POA: Diagnosis not present

## 2023-10-21 DIAGNOSIS — R293 Abnormal posture: Secondary | ICD-10-CM | POA: Diagnosis not present

## 2023-10-21 DIAGNOSIS — R29898 Other symptoms and signs involving the musculoskeletal system: Secondary | ICD-10-CM | POA: Insufficient documentation

## 2023-10-21 DIAGNOSIS — M5459 Other low back pain: Secondary | ICD-10-CM | POA: Diagnosis not present

## 2023-10-21 NOTE — Therapy (Signed)
 OUTPATIENT PHYSICAL THERAPY THORACOLUMBAR TREATMENT   Patient Name: Michelle Wilcox MRN: [id number] DOB:20-Sep-1945, 78 y.o., female Today's Date: 10/21/2023  END OF SESSION:  PT End of Session - 10/21/23 1447     Visit Number 9    Date for PT Re-Evaluation 11/01/23    Authorization Type Medicare B    Progress Note Due on Visit 10    PT Start Time 1401    PT Stop Time 1448    PT Time Calculation (min) 47 min    Activity Tolerance Patient tolerated treatment well    Behavior During Therapy WFL for tasks assessed/performed                     Past Medical History:  Diagnosis Date   Anxiety    hx of   Asthma    at times   CHF (congestive heart failure) (HCC)    Depression    hx of   Dyspnea    Giant cell arteritis (HCC)    Gouty arthropathy    Hemolytic anemia (HCC)    Hyperlipidemia    Hypertension    Irregular heart beat    extra beat   Migraines    Polymyalgia (HCC)    Polymyalgia rheumatica (HCC)    Temporal arteritis (HCC)    Past Surgical History:  Procedure Laterality Date   BARTHOLIN GLAND CYST EXCISION     non ca   BREAST BIOPSY Right    EYE MUSCLE SURGERY     as a child 70 yrs old   KNEE SURGERY  2004   rt knee   LAPAROSCOPIC SPLENECTOMY N/A 06/18/2018   Procedure: LAPAROSCOPIC SPLENECTOMY ERAS PATHWAY;  Surgeon: Glenna Fellows, MD;  Location: [facility];  Service: General;  Laterality: N/A;   toncil     TONSILLECTOMY     as a child   Patient Active Problem List   Diagnosis Date Noted   Primary localized osteoarthrosis of multiple sites 06/15/2022   Diastolic dysfunction without heart failure 03/05/2018   DOE (dyspnea on exertion) 03/05/2018   Preop cardiovascular exam 03/05/2018   Mild reactive airways disease 08/04/2017   Volume overload 08/03/2017   Hypokalemia 08/03/2017   Essential hypertension 08/03/2017   Hyperlipidemia 08/03/2017   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   Hypersensitivity reaction 11/20/2016    Drusen (degenerative) of retina, bilateral 05/22/2016   Nuclear cataract 05/22/2016   Retinal hemorrhage of left eye 05/22/2016   Temporal arteritis (HCC) 05/22/2016   Hemolytic anemia (HCC) 03/19/2016   Breast mass, right 07/01/2012    PCP: Gweneth Dimitri, MD  REFERRING PROVIDER: Gweneth Dimitri, MD  REFERRING DIAG: M81.0 (ICD-10-CM) - Age-related osteoporosis without current pathological fracture   Rationale for Evaluation and Treatment: Rehabilitation  THERAPY DIAG:  Abnormal posture  Muscle weakness (generalized)  Other low back pain  Other abnormalities of gait and mobility  ONSET DATE: Fall June 2024, diagnosed with osteoporosis 05/2023  SUBJECTIVE:  SUBJECTIVE STATEMENT: Feeling good today.  I've had a lot of appointments  Eval: Pt is a 78 y.o. female who presents to PT with age related osteoporosis.  Pt had a fall in June 2024 and had PT for several months following.  She fell forward and landed on her head when at work.  She was diagnosed with osteoporosis in September 2024.  Pt uses a cane for community ambulation.   PERTINENT HISTORY:  Osteoporosis, CHF, HTN, polymyalgia, lumbar DDD  PAIN: 10/07/23 Are you having pain? No and Yes: NPRS scale: 0/10 (neck), low back 0/10 Pain location: low back/neck Pain description: aching, severe  Aggravating factors: standing in one spot and not moving Relieving factors: sitting for a short period, moving around , stretching   PRECAUTIONS: Other: osteoporosis  RED FLAGS: None   WEIGHT BEARING RESTRICTIONS: No  FALLS:  Has patient fallen in last 6 months? Yes. 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  DIAGNOSTIC FINDINGS:  03/2023: T score at radius: -2.6 Femur -2.1  SCREENING FOR RED FLAGS: Bowel or bladder incontinence: No Spinal tumors: No Cauda equina syndrome: No Compression fracture: No Abdominal aneurysm: No  COGNITION: Overall cognitive status: Within functional limits for tasks assessed     SENSATION: WFL   POSTURE: rounded shoulders, forward head, and weight shift left  PALPATION: NA  LOWER EXTREMITY MMT:   Knee strength: Lt 4+/5, Rt 4/5, hip 4-/5 bil  FUNCTIONAL TESTS:  5 times sit to stand: 17.49  09/16/23: 5x sit to stand: 15 seconds without hands  GAIT: Distance walked: 75 Assistive device utilized: None Level of assistance: Complete Independence Comments: reduced trunk rotation, forward head, antalgic with reduced time on Rt LE  TODAY'S TREATMENT:                                                                                                                              DATE:   10/21/23   Nustep L4 x 6 min-PT present to discuss progress Standing on balance pad: hip to shoulder diagonal with 5# kettlebell 2x10 bil each  Seated hamstring stretch 2 x 30 sec bilateral  Holding 5# kettle bell on 1 side: step tap to 6" step 2x10 bil each  Sit to stand no UE support 2x10 with 5# kettlebell- seated on balance pad Farmer's carry: 8# kettle bell on each side- 1 lap each hand  Hurdles at barre: forward and lateral with step to pattern.  Min UE support at barre Tandem stance: red band shoulder extension and rows  2x10 bil each  10/14/23   Nustep L4 x 6 min-PT present to discuss progress Standing on balance pad: hip  to shoulder diagonal with 5# kettlebell 2x10 bil each  Seated hamstring stretch 2 x 30 sec bilateral  Holding 5# kettle bell on 1 side: marching x10 each side Sit to stand no UE support 2x10 with 5# kettlebell- seated on balance pad Box stepping: 8 laps each direction  Farmer's carry: 8# kettle bell on each side- 1 lap each hand  Hurdles at barre: forward and lateral with step to pattern.  Min UE support at barre Tandem stance: red band shoulder extension 2x10  10/07/23   Nustep L4 x 8 min-PT present to discuss progress Standing on balance pad: hip to shoulder diagonal with 5# kettlebell 2x10 bil each  Seated hamstring stretch 2 x 30 sec bilateral  Holding 5# kettle bell on 1 side: marching x10 each side Sit to stand no UE support 2x10 with 5# kettlebell- seated on balance pad Box stepping: 8 laps each direction  Farmer's carry: 5# kettle bell on each side- 1 lap each hand   PATIENT EDUCATION:  Education details: Access Code: [email or web address], osteoporosis information  Person educated: Patient Education method: Explanation, Demonstration, and Handouts Education comprehension: verbalized understanding and returned demonstration  HOME EXERCISE PROGRAM: Access Code: [email or web address] URL: [email or web address] Date: 08/19/2023 Prepared by: Claude Manges  Exercises - Standing Hip Abduction with Counter Support  - 1 x daily - 7 x weekly - 1-2 sets - 5-10 reps - Standing Hip Extension with Counter Support  - 1 x daily - 7 x weekly - 1-2 sets - 5-10 reps - Sit to Stand Without Arm Support  - 1 x daily - 7 x weekly - 1-2 sets - 10 reps - Standing Shoulder External Rotation with Resistance  - 2 x daily - 7 x weekly - 1-2 sets - 10 reps - Supine Bridge  - 2 x daily - 7 x weekly - 1-3 sets - 10 reps - Standing Shoulder Row with Anchored Resistance  - 1 x daily - 7 x weekly - 2 sets - 10 reps - Shoulder extension with resistance - Neutral  - 1 x daily - 7 x weekly - 2 sets - 10 reps - Standing  Shoulder Horizontal Abduction with Resistance  - 1 x daily - 7 x weekly - 2 sets - 10 reps  ASSESSMENT:  CLINICAL IMPRESSION: Pt reports that her balance feels improved since the start of care and is able to tolerate higher level tasks in the clinic each visit. She continues to be worried about her balance due to a fall in the past.  Main complaints are pain with long periods and walking without her cane.   PT provided supervision and cueing for alignment and technique today.  Patient will benefit from skilled PT to address the below impairments and improve overall function.     OBJECTIVE IMPAIRMENTS: Abnormal gait, decreased balance, decreased endurance, decreased mobility, decreased strength, increased muscle spasms, improper body mechanics, postural dysfunction, and pain.   ACTIVITY LIMITATIONS: standing, stairs, transfers, and locomotion level  PARTICIPATION LIMITATIONS: meal prep, community activity, and occupation  PERSONAL FACTORS: Social background, Time since onset of injury/illness/exacerbation, and 1-2 comorbidities: osteoporosis, lumbar DDD  are also affecting patient's functional outcome.   REHAB POTENTIAL: Good  CLINICAL DECISION MAKING: Stable/uncomplicated  EVALUATION COMPLEXITY: Low   GOALS: Goals reviewed with patient? Yes  SHORT TERM GOALS: Target date: 10/11/23    Patient will verbally understand ways  to strengthen postural musculature. Baseline: independent in current HEP for strength (2/17/25_ Goal status: MET   2.  Initiate HEP focusing on managing osteoporosis postural deficits and strength deficits. Baseline:  Goal status: MET  3.  Perform 5x sit to stand in < or = to 14 seconds  Baseline: 15 seconds (09/16/23) Goal status: in progress   4.  Report > or = to 20% reduction in LBP with standing and walking  Baseline:  Goal status: MET    LONG TERM GOALS: Target date: 11/01/23    Patient will verbally understand ways to manage osteoporosis with  diet and correct to reduce strain postures on spine. Baseline:  Goal status: in progress   2.  Patient will verbally understand correct body mechanics for home and work tasks to decrease strain on spine. Baseline:  Goal status: INITIAL  3.  Perform 5x sit to stand in < or = to 13 seconds to reduce falls risk Baseline: 15 seconds (09/16/23) Goal status: In progress   4.  Patient can verbally understand the dos and don'ts of osteoporosis management. Baseline: 10/07/23 Goal status:MET   5.  Report > or = to 30% reduction in LBP with standing and walking for work tasks  Baseline: up to 7/10 with static standing only , relief after sitting for 30 seconds Goal status: In progress   PLAN:  PT FREQUENCY: 1x/week  PT DURATION: 6 weeks  PLANNED INTERVENTIONS: 97110-Therapeutic exercises, 97530- Therapeutic activity, [id number]- Neuromuscular re-education, 97535- Self Care, 16109- Manual therapy, [id number]- Aquatic Therapy, 97014- Electrical stimulation (unattended), [phone]- Electrical stimulation (manual), Patient/Family education, Balance training, Stair training, Taping, Dry Needling, Vestibular training, Cryotherapy, and Moist heat.  PLAN FOR NEXT SESSION: address balance, alignment, postural strength  Lorrene Reid, PT 10/21/23 2:55 PM   [facility] [address] [place], [place] [address] Phone # [phone] Fax [phone]

## 2023-10-25 DIAGNOSIS — M25551 Pain in right hip: Secondary | ICD-10-CM | POA: Diagnosis not present

## 2023-10-25 DIAGNOSIS — M1711 Unilateral primary osteoarthritis, right knee: Secondary | ICD-10-CM | POA: Diagnosis not present

## 2023-10-28 ENCOUNTER — Ambulatory Visit: Payer: Medicare Other

## 2023-10-28 DIAGNOSIS — R293 Abnormal posture: Secondary | ICD-10-CM | POA: Diagnosis not present

## 2023-10-28 DIAGNOSIS — M6281 Muscle weakness (generalized): Secondary | ICD-10-CM

## 2023-10-28 DIAGNOSIS — R2689 Other abnormalities of gait and mobility: Secondary | ICD-10-CM | POA: Diagnosis not present

## 2023-10-28 DIAGNOSIS — M5459 Other low back pain: Secondary | ICD-10-CM | POA: Diagnosis not present

## 2023-10-28 DIAGNOSIS — F4310 Post-traumatic stress disorder, unspecified: Secondary | ICD-10-CM | POA: Diagnosis not present

## 2023-10-28 DIAGNOSIS — R29898 Other symptoms and signs involving the musculoskeletal system: Secondary | ICD-10-CM | POA: Diagnosis not present

## 2023-10-28 DIAGNOSIS — R262 Difficulty in walking, not elsewhere classified: Secondary | ICD-10-CM | POA: Diagnosis not present

## 2023-10-28 NOTE — Therapy (Signed)
 OUTPATIENT PHYSICAL THERAPY THORACOLUMBAR TREATMENT   Patient Name: Michelle Wilcox MRN: [id number] DOB:[birth year], 78 y.o., female Today's Date: 10/28/2023 Progress Note Reporting Period 07/21/24 to 10/28/23  See note below for Objective Data and Assessment of Progress/Goals.     END OF SESSION:  PT End of Session - 10/28/23 1144     Visit Number 10    Date for PT Re-Evaluation 12/23/23    Authorization Type Medicare B    Progress Note Due on Visit 20    PT Start Time 1104    PT Stop Time 1145    PT Time Calculation (min) 41 min    Activity Tolerance Patient tolerated treatment well    Behavior During Therapy WFL for tasks assessed/performed                      Past Medical History:  Diagnosis Date   Anxiety    hx of   Asthma    at times   CHF (congestive heart failure) (HCC)    Depression    hx of   Dyspnea    Giant cell arteritis (HCC)    Gouty arthropathy    Hemolytic anemia (HCC)    Hyperlipidemia    Hypertension    Irregular heart beat    extra beat   Migraines    Polymyalgia (HCC)    Polymyalgia rheumatica (HCC)    Temporal arteritis (HCC)    Past Surgical History:  Procedure Laterality Date   BARTHOLIN GLAND CYST EXCISION     non ca   BREAST BIOPSY Right    EYE MUSCLE SURGERY     as a child 8 yrs old   KNEE SURGERY  2004   rt knee   LAPAROSCOPIC SPLENECTOMY N/A 06/18/2018   Procedure: LAPAROSCOPIC SPLENECTOMY ERAS PATHWAY;  Surgeon: Glenna Fellows, MD;  Location: [facility];  Service: General;  Laterality: N/A;   toncil     TONSILLECTOMY     as a child   Patient Active Problem List   Diagnosis Date Noted   Primary localized osteoarthrosis of multiple sites 06/15/2022   Diastolic dysfunction without heart failure 03/05/2018   DOE (dyspnea on exertion) 03/05/2018   Preop cardiovascular exam 03/05/2018   Mild reactive airways disease 08/04/2017   Volume overload 08/03/2017   Hypokalemia 08/03/2017   Essential  hypertension 08/03/2017   Hyperlipidemia 08/03/2017   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   Hypersensitivity reaction 11/20/2016   Drusen (degenerative) of retina, bilateral 05/22/2016   Nuclear cataract 05/22/2016   Retinal hemorrhage of left eye 05/22/2016   Temporal arteritis (HCC) 05/22/2016   Hemolytic anemia (HCC) 03/19/2016   Breast mass, right 07/01/2012    PCP: Gweneth Dimitri, MD  REFERRING PROVIDER: Gweneth Dimitri, MD  REFERRING DIAG: M81.0 (ICD-10-CM) - Age-related osteoporosis without current pathological fracture   Rationale for Evaluation and Treatment: Rehabilitation  THERAPY DIAG:  Abnormal posture  Muscle weakness (generalized)  Other abnormalities of gait and mobility  ONSET DATE: Fall June 2024, diagnosed with osteoporosis 05/2023  SUBJECTIVE:  SUBJECTIVE STATEMENT: I had a fall at home when I tripped on the handle of a freezer bag on Thursday.  I saw a PA at [facility] and got a steroid injection and they gave me a knee brace.  I need a knee replacement but this is not a good time due to care of my husband.   Eval: Pt is a 78 y.o. female who presents to PT with age related osteoporosis.  Pt had a fall in June 2024 and had PT for several months following.  She fell forward and landed on her head when at work.  She was diagnosed with osteoporosis in September 2024.  Pt uses a cane for community ambulation.   PERTINENT HISTORY:  Osteoporosis, CHF, HTN, polymyalgia, lumbar DDD  PAIN: 10/28/23 Are you having pain? No and Yes: NPRS scale: 0/10 (neck), low back 0/10 Pain location: knee 1/10 Pain description: aching, severe  Aggravating factors: standing and walking  Relieving factors: knee brace , stretching   PRECAUTIONS: Other: osteoporosis  RED  FLAGS: None   WEIGHT BEARING RESTRICTIONS: No  FALLS:  Has patient fallen in last 6 months? Yes. 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  DIAGNOSTIC FINDINGS:  03/2023: T score at radius: -2.6 Femur -2.1  SCREENING FOR RED FLAGS: Bowel or bladder incontinence: No Spinal tumors: No Cauda equina syndrome: No Compression fracture: No Abdominal aneurysm: No  COGNITION: Overall cognitive status: Within functional limits for tasks assessed     SENSATION: WFL   POSTURE: rounded shoulders, forward head, and weight shift left  PALPATION: NA  LOWER EXTREMITY MMT:   Knee strength: Lt 4+/5, Rt 4/5, hip 4-/5 bil  FUNCTIONAL TESTS:  5 times sit to stand: 17.49  09/16/23: 5x sit to stand: 15 seconds without hands 10/28/23: 5x sit to stand:  6 min walk test: 880 feet with walking stick (age related norm is 1172) ABC: 700/1600=43.75%   GAIT: Distance walked: 75 Assistive device utilized: None Level of assistance: Complete Independence Comments: reduced trunk rotation, forward head, antalgic with reduced time on Rt LE  TODAY'S TREATMENT:                                                                                                                              DATE:  10/28/23   Nustep L4 x 6 min-PT present to discuss progress 6 min walk test and other objective measures   Educated regarding importance of resuming exercises after fall Seated hamstring stretch 2 x 30 sec bilateral   10/21/23  Nustep L4  x 6 min-PT present to discuss progress Standing on balance pad: hip to shoulder diagonal with 5# kettlebell 2x10 bil each  Seated hamstring stretch 2 x 30 sec bilateral  Holding 5# kettle bell on 1 side: step tap to 6" step 2x10 bil each  Sit to stand no UE support 2x10 with 5# kettlebell- seated on balance pad Farmer's carry: 8# kettle bell on each side- 1 lap each hand  Hurdles at barre: forward and lateral with step to pattern.  Min UE support at barre Tandem stance: red band shoulder extension and rows 2x10 bil each  10/14/23   Nustep L4 x 6 min-PT present to discuss progress Standing on balance pad: hip to shoulder diagonal with 5# kettlebell 2x10 bil each  Seated hamstring stretch 2 x 30 sec bilateral  Holding 5# kettle bell on 1 side: marching x10 each side Sit to stand no UE support 2x10 with 5# kettlebell- seated on balance pad Box stepping: 8 laps each direction  Farmer's carry: 8# kettle bell on each side- 1 lap each hand  Hurdles at barre: forward and lateral with step to pattern.  Min UE support at barre Tandem stance: red band shoulder extension 2x10   PATIENT EDUCATION:  Education details: Access Code: [email or web address], osteoporosis information  Person educated: Patient Education method: Explanation, Demonstration, and Handouts Education comprehension: verbalized understanding and returned demonstration  HOME EXERCISE PROGRAM: Access Code: [email or web address] URL: [email or web address] Date: 08/19/2023 Prepared by: Claude Manges  Exercises - Standing Hip Abduction with Counter Support  - 1 x daily - 7 x weekly - 1-2 sets - 5-10 reps - Standing Hip Extension with Counter Support  - 1 x daily - 7 x weekly - 1-2 sets - 5-10 reps - Sit to Stand Without Arm Support  - 1 x daily - 7 x weekly - 1-2 sets - 10 reps - Standing Shoulder External Rotation with Resistance  - 2 x daily - 7 x weekly - 1-2 sets - 10 reps - Supine Bridge  - 2 x daily - 7 x weekly - 1-3 sets - 10 reps -  Standing Shoulder Row with Anchored Resistance  - 1 x daily - 7 x weekly - 2 sets - 10 reps - Shoulder extension with resistance - Neutral  - 1 x daily - 7 x weekly - 2 sets - 10 reps - Standing Shoulder Horizontal Abduction with Resistance  - 1 x daily - 7 x weekly - 2 sets - 10 reps  ASSESSMENT:  CLINICAL IMPRESSION: Pt had a fall 4 days ago when she tripped on the handle of a bag on the floor.  She hurt her Rt knee and received cortisone injection and was issued with a knee brace.  Pt was  emotional due to PTSD related to prior fall.  Assessment complete for renewal.  6 min walk test is 880 feet and age related norme is 1172.  ABC is 43.75% and PT will continue to work on balance to improve this. Patient will benefit from skilled PT to address the below impairments and improve overall function.     OBJECTIVE IMPAIRMENTS: Abnormal gait, decreased balance, decreased endurance, decreased mobility, decreased strength, increased muscle spasms, improper body mechanics, postural dysfunction, and pain.   ACTIVITY LIMITATIONS: standing, stairs, transfers, and locomotion level  PARTICIPATION LIMITATIONS: meal prep, community activity, and occupation  PERSONAL FACTORS: Social background, Time since onset of injury/illness/exacerbation, and 1-2 comorbidities: osteoporosis, lumbar DDD  are also affecting patient's functional outcome.  REHAB POTENTIAL: Good  CLINICAL DECISION MAKING: Stable/uncomplicated  EVALUATION COMPLEXITY: Low   GOALS: Goals reviewed with patient? Yes  SHORT TERM GOALS: Target date: 10/11/23    Patient will verbally understand ways to strengthen postural musculature. Baseline: independent in current HEP for strength (2/17/25_ Goal status: MET   2.  Initiate HEP focusing on managing osteoporosis postural deficits and strength deficits. Baseline:  Goal status: MET  3.  Perform 5x sit to stand in < or = to 14 seconds  Baseline: 15 seconds (09/16/23) Goal status: in  progress   4.  Report > or = to 20% reduction in LBP with standing and walking  Baseline:  Goal status: MET      LONG TERM GOALS: Target date:12/23/2023      Patient will verbally understand ways to manage osteoporosis with diet and correct to reduce strain postures on spine. Baseline:  Goal status: in progress   2.  Improve 6 min walk test to > or = to 1050 feet to improve community ambulation  Baseline: 880 feet  Goal status: INITIAL  3.  Perform 5x sit to stand in < or = to 13 seconds to reduce falls risk Baseline: 15 seconds (09/16/23) Goal status: In progress   4.  Improve ABC to > or = to 60% to improve confidence in the community  Baseline: 43.75%  Goal status:NEW      PLAN:  PT FREQUENCY: 1x/week  PT DURATION: 6 weeks  PLANNED INTERVENTIONS: 97110-Therapeutic exercises, 97530- Therapeutic activity, 97112- Neuromuscular re-education, 97535- Self Care, 16109- Manual therapy, [id number]- Aquatic Therapy, 97014- Electrical stimulation (unattended), [phone]- Electrical stimulation (manual), Patient/Family education, Balance training, Stair training, Taping, Dry Needling, Vestibular training, Cryotherapy, and Moist heat.  PLAN FOR NEXT SESSION: address balance, alignment, postural strength  Lorrene Reid, PT 10/28/23 11:48 AM   [facility] [address], [address] [place], [place] [address] Phone # [phone] Fax [phone]

## 2023-11-04 ENCOUNTER — Inpatient Hospital Stay (HOSPITAL_COMMUNITY)
Admission: EM | Admit: 2023-11-04 | Discharge: 2023-11-11 | DRG: 308 | Disposition: A | Discharge status: Home / Self Care | Source: Ambulatory Visit | Attending: Internal Medicine | Admitting: Internal Medicine

## 2023-11-04 ENCOUNTER — Encounter (HOSPITAL_COMMUNITY): Payer: Self-pay

## 2023-11-04 ENCOUNTER — Emergency Department (HOSPITAL_COMMUNITY)

## 2023-11-04 ENCOUNTER — Other Ambulatory Visit: Payer: Self-pay

## 2023-11-04 ENCOUNTER — Ambulatory Visit

## 2023-11-04 DIAGNOSIS — Z880 Allergy status to penicillin: Secondary | ICD-10-CM | POA: Diagnosis not present

## 2023-11-04 DIAGNOSIS — Z7901 Long term (current) use of anticoagulants: Secondary | ICD-10-CM | POA: Diagnosis not present

## 2023-11-04 DIAGNOSIS — D72829 Elevated white blood cell count, unspecified: Secondary | ICD-10-CM | POA: Diagnosis present

## 2023-11-04 DIAGNOSIS — I429 Cardiomyopathy, unspecified: Secondary | ICD-10-CM | POA: Diagnosis not present

## 2023-11-04 DIAGNOSIS — D591 Autoimmune hemolytic anemia, unspecified: Secondary | ICD-10-CM | POA: Diagnosis present

## 2023-11-04 DIAGNOSIS — J45909 Unspecified asthma, uncomplicated: Secondary | ICD-10-CM | POA: Diagnosis not present

## 2023-11-04 DIAGNOSIS — R946 Abnormal results of thyroid function studies: Secondary | ICD-10-CM | POA: Diagnosis not present

## 2023-11-04 DIAGNOSIS — I5032 Chronic diastolic (congestive) heart failure: Secondary | ICD-10-CM | POA: Diagnosis not present

## 2023-11-04 DIAGNOSIS — R918 Other nonspecific abnormal finding of lung field: Secondary | ICD-10-CM | POA: Diagnosis not present

## 2023-11-04 DIAGNOSIS — J90 Pleural effusion, not elsewhere classified: Secondary | ICD-10-CM | POA: Diagnosis not present

## 2023-11-04 DIAGNOSIS — R5383 Other fatigue: Secondary | ICD-10-CM | POA: Diagnosis present

## 2023-11-04 DIAGNOSIS — I4819 Other persistent atrial fibrillation: Secondary | ICD-10-CM | POA: Diagnosis not present

## 2023-11-04 DIAGNOSIS — Z87892 Personal history of anaphylaxis: Secondary | ICD-10-CM

## 2023-11-04 DIAGNOSIS — I480 Paroxysmal atrial fibrillation: Secondary | ICD-10-CM | POA: Diagnosis present

## 2023-11-04 DIAGNOSIS — Z882 Allergy status to sulfonamides status: Secondary | ICD-10-CM | POA: Diagnosis not present

## 2023-11-04 DIAGNOSIS — Z91018 Allergy to other foods: Secondary | ICD-10-CM

## 2023-11-04 DIAGNOSIS — F419 Anxiety disorder, unspecified: Secondary | ICD-10-CM | POA: Diagnosis not present

## 2023-11-04 DIAGNOSIS — I10 Essential (primary) hypertension: Secondary | ICD-10-CM | POA: Diagnosis not present

## 2023-11-04 DIAGNOSIS — I3139 Other pericardial effusion (noninflammatory): Secondary | ICD-10-CM

## 2023-11-04 DIAGNOSIS — D7589 Other specified diseases of blood and blood-forming organs: Secondary | ICD-10-CM | POA: Diagnosis not present

## 2023-11-04 DIAGNOSIS — Z9081 Acquired absence of spleen: Secondary | ICD-10-CM

## 2023-11-04 DIAGNOSIS — Z7952 Long term (current) use of systemic steroids: Secondary | ICD-10-CM

## 2023-11-04 DIAGNOSIS — I513 Intracardiac thrombosis, not elsewhere classified: Secondary | ICD-10-CM | POA: Diagnosis present

## 2023-11-04 DIAGNOSIS — I5043 Acute on chronic combined systolic (congestive) and diastolic (congestive) heart failure: Secondary | ICD-10-CM | POA: Diagnosis present

## 2023-11-04 DIAGNOSIS — I5020 Unspecified systolic (congestive) heart failure: Secondary | ICD-10-CM | POA: Diagnosis not present

## 2023-11-04 DIAGNOSIS — M316 Other giant cell arteritis: Secondary | ICD-10-CM | POA: Diagnosis present

## 2023-11-04 DIAGNOSIS — R000 Tachycardia, unspecified: Secondary | ICD-10-CM | POA: Diagnosis not present

## 2023-11-04 DIAGNOSIS — Z888 Allergy status to other drugs, medicaments and biological substances status: Secondary | ICD-10-CM

## 2023-11-04 DIAGNOSIS — Z79899 Other long term (current) drug therapy: Secondary | ICD-10-CM | POA: Diagnosis not present

## 2023-11-04 DIAGNOSIS — E785 Hyperlipidemia, unspecified: Secondary | ICD-10-CM | POA: Diagnosis present

## 2023-11-04 DIAGNOSIS — Z91011 Allergy to milk products: Secondary | ICD-10-CM

## 2023-11-04 DIAGNOSIS — F32A Depression, unspecified: Secondary | ICD-10-CM | POA: Diagnosis present

## 2023-11-04 DIAGNOSIS — I517 Cardiomegaly: Secondary | ICD-10-CM | POA: Diagnosis not present

## 2023-11-04 DIAGNOSIS — M353 Polymyalgia rheumatica: Secondary | ICD-10-CM | POA: Diagnosis not present

## 2023-11-04 DIAGNOSIS — Z823 Family history of stroke: Secondary | ICD-10-CM

## 2023-11-04 DIAGNOSIS — R531 Weakness: Secondary | ICD-10-CM | POA: Diagnosis present

## 2023-11-04 DIAGNOSIS — I959 Hypotension, unspecified: Secondary | ICD-10-CM | POA: Diagnosis present

## 2023-11-04 DIAGNOSIS — I110 Hypertensive heart disease with heart failure: Secondary | ICD-10-CM | POA: Diagnosis not present

## 2023-11-04 DIAGNOSIS — I700 Atherosclerosis of aorta: Secondary | ICD-10-CM | POA: Diagnosis not present

## 2023-11-04 DIAGNOSIS — I4891 Unspecified atrial fibrillation: Secondary | ICD-10-CM | POA: Diagnosis not present

## 2023-11-04 DIAGNOSIS — E66811 Obesity, class 1: Secondary | ICD-10-CM | POA: Diagnosis not present

## 2023-11-04 DIAGNOSIS — Z6832 Body mass index (BMI) 32.0-32.9, adult: Secondary | ICD-10-CM | POA: Diagnosis not present

## 2023-11-04 DIAGNOSIS — M315 Giant cell arteritis with polymyalgia rheumatica: Secondary | ICD-10-CM | POA: Diagnosis present

## 2023-11-04 DIAGNOSIS — Z881 Allergy status to other antibiotic agents status: Secondary | ICD-10-CM

## 2023-11-04 DIAGNOSIS — R110 Nausea: Secondary | ICD-10-CM | POA: Diagnosis not present

## 2023-11-04 DIAGNOSIS — R519 Headache, unspecified: Secondary | ICD-10-CM | POA: Diagnosis not present

## 2023-11-04 DIAGNOSIS — M7989 Other specified soft tissue disorders: Secondary | ICD-10-CM | POA: Diagnosis present

## 2023-11-04 DIAGNOSIS — I5189 Other ill-defined heart diseases: Secondary | ICD-10-CM | POA: Diagnosis not present

## 2023-11-04 DIAGNOSIS — Z8261 Family history of arthritis: Secondary | ICD-10-CM

## 2023-11-04 DIAGNOSIS — I340 Nonrheumatic mitral (valve) insufficiency: Secondary | ICD-10-CM | POA: Diagnosis not present

## 2023-11-04 DIAGNOSIS — E0781 Sick-euthyroid syndrome: Secondary | ICD-10-CM | POA: Diagnosis not present

## 2023-11-04 DIAGNOSIS — I509 Heart failure, unspecified: Secondary | ICD-10-CM | POA: Diagnosis not present

## 2023-11-04 DIAGNOSIS — I5021 Acute systolic (congestive) heart failure: Secondary | ICD-10-CM | POA: Diagnosis not present

## 2023-11-04 DIAGNOSIS — J9811 Atelectasis: Secondary | ICD-10-CM | POA: Diagnosis not present

## 2023-11-04 DIAGNOSIS — R069 Unspecified abnormalities of breathing: Secondary | ICD-10-CM | POA: Diagnosis not present

## 2023-11-04 DIAGNOSIS — M79671 Pain in right foot: Secondary | ICD-10-CM | POA: Diagnosis present

## 2023-11-04 DIAGNOSIS — M79672 Pain in left foot: Secondary | ICD-10-CM | POA: Diagnosis present

## 2023-11-04 LAB — BASIC METABOLIC PANEL
Anion gap: 9 (ref 5–15)
BUN: 17 mg/dL (ref 8–23)
CO2: 28 mmol/L (ref 22–32)
Calcium: 8.8 mg/dL — ABNORMAL LOW (ref 8.9–10.3)
Chloride: 100 mmol/L (ref 98–111)
Creatinine, Ser: 0.84 mg/dL (ref 0.44–1.00)
GFR, Estimated: >60 mL/min (ref >60)
Glucose, Bld: 116 mg/dL — ABNORMAL HIGH (ref 70–99)
Potassium: 4.5 mmol/L (ref 3.5–5.1)
Sodium: 137 mmol/L (ref 135–145)

## 2023-11-04 LAB — TSH: TSH: 1.266 u[IU]/mL (ref 0.350–4.500)

## 2023-11-04 LAB — BRAIN NATRIURETIC PEPTIDE: B Natriuretic Peptide: 629.3 pg/mL — ABNORMAL HIGH (ref 0.0–100.0)

## 2023-11-04 LAB — CBC
HCT: 40.8 % (ref 36.0–46.0)
Hemoglobin: 13.4 g/dL (ref 12.0–15.0)
MCH: 33 pg (ref 26.0–34.0)
MCHC: 32.8 g/dL (ref 30.0–36.0)
MCV: 100.5 fL — ABNORMAL HIGH (ref 80.0–100.0)
Platelets: 332 10*3/uL (ref 150–400)
RBC: 4.06 MIL/uL (ref 3.87–5.11)
RDW: 14.8 % (ref 11.5–15.5)
WBC: 11.5 10*3/uL — ABNORMAL HIGH (ref 4.0–10.5)
nRBC: 0.2 % (ref 0.0–0.2)

## 2023-11-04 LAB — TROPONIN I (HIGH SENSITIVITY)
Troponin I (High Sensitivity): 14 ng/L (ref <18)
Troponin I (High Sensitivity): 15 ng/L (ref <18)

## 2023-11-04 LAB — T4, FREE: Free T4: 1.17 ng/dL — ABNORMAL HIGH (ref 0.61–1.12)

## 2023-11-04 MED ORDER — METOPROLOL TARTRATE 5 MG/5ML IV SOLN: 5.0000 mg | Freq: Once | INTRAVENOUS | Status: DC

## 2023-11-04 MED ORDER — HEPARIN (PORCINE) 25000 UT/250ML-% IV SOLN
1100.0000 [IU]/h | INTRAVENOUS | Status: DC
  Administered 2023-11-04 – 2023-11-05 (×2): 1100 [IU]/h via INTRAVENOUS
  Filled 2023-11-04: qty 250

## 2023-11-04 MED ORDER — LACTATED RINGERS IV BOLUS
1000.0000 mL | Freq: Once | INTRAVENOUS | Status: AC
  Administered 2023-11-04: 1000 mL via INTRAVENOUS

## 2023-11-04 MED ORDER — HEPARIN BOLUS VIA INFUSION
3500.0000 [IU] | Freq: Once | INTRAVENOUS | Status: AC
  Administered 2023-11-04: 3500 [IU] via INTRAVENOUS
  Filled 2023-11-04: qty 3500

## 2023-11-04 MED ORDER — IOHEXOL 350 MG/ML SOLN
75.0000 mL | Freq: Once | INTRAVENOUS | Status: AC | PRN
  Administered 2023-11-04: 75 mL via INTRAVENOUS

## 2023-11-04 MED ORDER — METOPROLOL TARTRATE 25 MG PO TABS: 25.0000 mg | ORAL_TABLET | Freq: Once | ORAL | Status: DC

## 2023-11-04 MED ORDER — DILTIAZEM HCL-DEXTROSE 125-5 MG/125ML-% IV SOLN (PREMIX)
5.0000 mg/h | INTRAVENOUS | Status: DC
  Administered 2023-11-04: 5 mg/h via INTRAVENOUS
  Administered 2023-11-05 (×3): 15 mg/h via INTRAVENOUS
  Filled 2023-11-04 (×7): qty 125

## 2023-11-04 MED ORDER — DILTIAZEM LOAD VIA INFUSION
10.0000 mg | Freq: Once | INTRAVENOUS | Status: AC
  Administered 2023-11-04: 10 mg via INTRAVENOUS
  Filled 2023-11-04: qty 10

## 2023-11-04 NOTE — Consult Note (Signed)
 Cardiology Consult:   Patient ID: Michelle Wilcox; MRN: [id number]; DOB: May 05, 1946   Admission date: 11/04/2023  Primary Care Provider: Gweneth Dimitri, MD Primary Cardiologist: Michelle Wilcox  Chief Complaint:  New AF  Patient Profile:   Michelle Wilcox is a 78 y.o. female with history of diastolic dysfunction who presents new symptomatic AV RVR  History of Present Illness:   [name] is a 78 year old female with polymyalgia rheumatica and temporal arteritis who presents with new onset atrial fibrillation. She was referred by Dr. Herbie Wilcox for evaluation of atrial fibrillation with rapid ventricular response.  She presents with new onset atrial fibrillation characterized by a rapid ventricular response, with a heart rate in the 150s. She describes a sensation of 'fluttering' in her chest. The atrial fibrillation was identified following a cortisone injection in her knee, which she believes may have triggered the condition.  She experiences shortness of breath, dizziness, and palpitations for more than a few days, though the exact duration is unclear. No recent chest pain. These symptoms began after receiving a cortisone injection in her knee, which she associates with the onset of her current condition.  Her past medical history includes polymyalgia rheumatica, temporal arteritis, and a vague history of heart failure. Previous imaging in 2018 and 2020 suggested normal heart function. She has not seen a cardiologist since her last visit in August 2024, where she was noted to have well-controlled diastolic dysfunction without evidence of heart failure, normal blood pressure, and slightly elevated cholesterol.  She is concerned about the duration of her symptoms and the potential for blood clots. She reports a slight elevation in her white blood cell count, which could be related to the knee injection, but her hemoglobin levels are normal.  Allergies:    Allergies  Allergen  Reactions   Penicillins Anaphylaxis    Has patient had a PCN reaction causing immediate rash, facial/tongue/throat swelling, SOB or lightheadedness with hypotension: yes Has patient had a PCN reaction causing severe rash involving mucus membranes or skin necrosis: no Has patient had a PCN reaction that required hospitalization: yes Has patient had a PCN reaction occurring within the last 10 years: no If all of the above answers are "NO", then may proceed with Cephalosporin use.    Almond (Diagnostic) Hives   Ceftin [Cefuroxime Axetil] Hives    headaches   Ciprofloxacin Hives    headaches   Darvocet [Propoxyphene N-Acetaminophen]     headaches   Lactose Intolerance (Gi) Diarrhea   Levaquin [Levofloxacin In D5w] Hives   Sulfa Antibiotics Hives   Sulfamethoxazole-Trimethoprim     Other reaction(s): Unknown   Zyrtec [Cetirizine Hcl]     headache   Other     Powder in gloves - Rash  MSG - Hives  Swiss Cheese - Hives       Social History:   Social History   Socioeconomic History   Marital status: Married    Spouse name: Not on file   Number of children: Not on file   Years of education: Not on file   Highest education level: Not on file  Occupational History   Not on file  Tobacco Use   Smoking status: Never   Smokeless tobacco: Never  Vaping Use   Vaping status: Never Used  Substance and Sexual Activity   Alcohol use: Yes    Alcohol/week: 1.0 standard drink of alcohol    Types: 1 Glasses of wine per week    Comment: occasional  Drug use: Never   Sexual activity: Not Currently  Other Topics Concern   Not on file  Social History Narrative   Not on file   Social Drivers of Health   Financial Resource Strain: Not on file  Food Insecurity: Low Risk  (03/06/2023)   Received from [facility]   Hunger Vital Sign    Worried About Running Out of Food in the Last Year: Never true    Ran Out of Food in the Last Year: Never true  Transportation Needs: Not on file   Physical Activity: Not on file  Stress: Not on file  Social Connections: Not on file  Intimate Partner Violence: Not on file    Family History:   The patient's family history includes Arthritis in her mother; Other in her mother; Stroke in her mother. She was adopted.    ROS:  Please see the history of present illness.   Physical Exam/Data:   Vitals:   11/04/23 1725 11/04/23 1730 11/04/23 1735 11/04/23 1740  BP:  (!) 132/95    Pulse: (!) 115 (!) 152 (!) 135 (!) 120  Resp: 18 18 20  (!) 25  Temp:      TempSrc:      SpO2: 100% 99% 98% 98%  Weight:      Height:       No intake or output data in the 24 hours ending 11/04/23 1813 Filed Weights   11/04/23 1625  Weight: 84.8 kg   Body mass index is 32.1 kg/m.   Gen: mild distress   Neck: No JVD Cardiac: No Rubs or Gallops, systolic murmur, IRIR tachycardia, +2 radial pulses Respiratory: Clear to auscultation bilaterally, normal effort, normal  respiratory rate GI: Soft, nontender, non-distended  MS: No  edema;  moves all extremities Integument: Skin feels warm Neuro:  At time of evaluation, alert and oriented to person/place/time/situation  Psych: Normal affect, patient feels fair    EKG:  The ECG that was done  was personally reviewed and demonstrates AF RVR  Relevant CV Studies:  Cardiac Studies & Procedures   ______________________________________________________________________________________________   STRESS TESTS  MYOCARDIAL PERFUSION IMAGING 03/11/2018  Narrative  The left ventricular ejection fraction is hyperdynamic (>65%).  Nuclear stress EF: 69%.  There was no ST segment deviation noted during stress.  The study is normal.  This is a low risk study.  Low risk stress nuclear study with normal perfusion and normal left ventricular regional and global systolic function.   ECHOCARDIOGRAM  ECHOCARDIOGRAM COMPLETE 08/04/2017  Narrative *[facility]* *[facility]* [address] [place], [place] [address] [phone]  ------------------------------------------------------------------- Transthoracic Echocardiography  Patient:    Michelle Wilcox, Michelle Wilcox MR #:       [id number] Study Date: 08/04/2017 Gender:     F Age:        71 Height:     162.6 cm Weight:     89.7 kg BSA:        2.05 m^2 Pt. Status: Room:       [address]    Renae Fickle [id number] PERFORMING   [facility], Inpatient SONOGRAPHER  Thurman Coyer ADMITTING    Bobette Mo ORDERING     Bobette Mo REFERRING    Bobette Mo  cc:  ------------------------------------------------------------------- LV EF: 60% -   65%  ------------------------------------------------------------------- Indications:      Dyspnea 786.09.  ------------------------------------------------------------------- History:   Risk factors:  Hypertension. Dyslipidemia.  ------------------------------------------------------------------- Study Conclusions  - Left ventricle: The cavity  size was normal. Wall thickness was normal. Systolic function was normal. The estimated ejection fraction was in the range of 60% to 65%. Wall motion was normal; there were no regional wall motion abnormalities. Doppler parameters are consistent with abnormal left ventricular relaxation (grade 1 diastolic dysfunction). - Aortic valve: There was no stenosis. There was mild regurgitation. - Mitral valve: There was mild regurgitation. - Left atrium: The atrium was mildly dilated. - Right ventricle: The cavity size was normal. Systolic function was normal. - Tricuspid valve: Peak RV-RA gradient (S): 35 mm Hg. - Pulmonary arteries: PA peak pressure: 43 mm Hg (S). - Systemic veins: IVC measured 2.3 cm with > 50% respirophasic variation, suggesting RA pressure 8 mmHg.  Impressions:  - Normal LV size with EF 60-65%. Normal RV size and systolic function. Mild mitral regurgitation. Mild pulmonary  hypertension.  ------------------------------------------------------------------- Study data:  No prior study was available for comparison.  Study status:  Routine.  Procedure:  The patient reported no pain pre or post test. Transthoracic echocardiography. Image quality was adequate.  Study completion:  There were no complications. Transthoracic echocardiography.  M-mode, complete 2D, spectral Doppler, and color Doppler.  Birthdate:  Patient birthdate: 1946-04-10.  Age:  Patient is 78 yr old.  Sex:  Gender: female. BMI: 33.9 kg/m^2.  Blood pressure:     119/66  Patient status: Inpatient.  Study date:  Study date: 08/04/2017. Study time: 10:08 AM.  Location:  Bedside.  -------------------------------------------------------------------  ------------------------------------------------------------------- Left ventricle:  The cavity size was normal. Wall thickness was normal. Systolic function was normal. The estimated ejection fraction was in the range of 60% to 65%. Wall motion was normal; there were no regional wall motion abnormalities. Doppler parameters are consistent with abnormal left ventricular relaxation (grade 1 diastolic dysfunction).  ------------------------------------------------------------------- Aortic valve:   Trileaflet.  Doppler:   There was no stenosis. There was mild regurgitation.  ------------------------------------------------------------------- Aorta:  Aortic root: The aortic root was normal in size. Ascending aorta: The ascending aorta was normal in size.  ------------------------------------------------------------------- Mitral valve:   Normal thickness leaflets .  Doppler:   There was no evidence for stenosis.   There was mild regurgitation.    Peak gradient (D): 3 mm Hg.  ------------------------------------------------------------------- Left atrium:  The atrium was mildly  dilated.  ------------------------------------------------------------------- Right ventricle:  The cavity size was normal. Systolic function was normal.  ------------------------------------------------------------------- Pulmonic valve:    Structurally normal valve.   Cusp separation was normal.  Doppler:  Transvalvular velocity was within the normal range. There was no regurgitation.  ------------------------------------------------------------------- Tricuspid valve:   Doppler:  There was trivial regurgitation.  ------------------------------------------------------------------- Right atrium:  The atrium was normal in size.  ------------------------------------------------------------------- Pericardium:  There was no pericardial effusion.  ------------------------------------------------------------------- Systemic veins:  IVC measured 2.3 cm with > 50% respirophasic variation, suggesting RA pressure 8 mmHg.  ------------------------------------------------------------------- Measurements  Left ventricle                         Value        Reference LV ID, ED, PLAX chordal                52    mm     43 - 52 LV ID, ES, PLAX chordal                28.5  mm     23 - 38 LV fx shortening, PLAX chordal  45    %      >=29 LV PW thickness, ED                    10.9  mm     ---------- IVS/LV PW ratio, ED                    0.89         <=1.3 Stroke volume, 2D                      79    ml     ---------- Stroke volume/bsa, 2D                  39    ml/m^2 ---------- LV e&', lateral                         9.25  cm/s   ---------- LV E/e&', lateral                       8.65         ---------- LV e&', medial                          7.83  cm/s   ---------- LV E/e&', medial                        10.22        ---------- LV e&', average                         8.54  cm/s   ---------- LV E/e&', average                       9.37         ----------  Ventricular septum                      Value        Reference IVS thickness, ED                      9.68  mm     ----------  LVOT                                   Value        Reference LVOT ID, S                             20    mm     ---------- LVOT area                              3.14  cm^2   ---------- LVOT peak velocity, S                  117   cm/s   ---------- LVOT mean velocity, S                  73.2  cm/s   ---------- LVOT VTI, S  25.1  cm     ---------- LVOT peak gradient, S                  5     mm Hg  ----------  Aorta                                  Value        Reference Aortic root ID, ED                     28    mm     ----------  Left atrium                            Value        Reference LA ID, A-P, ES                         44    mm     ---------- LA ID/bsa, A-P                         2.15  cm/m^2 <=2.2 LA volume, S                           64    ml     ---------- LA volume/bsa, S                       31.2  ml/m^2 ---------- LA volume, ES, 1-p A4C                 84.1  ml     ---------- LA volume/bsa, ES, 1-p A4C             41    ml/m^2 ---------- LA volume, ES, 1-p A2C                 42.1  ml     ---------- LA volume/bsa, ES, 1-p A2C             20.5  ml/m^2 ----------  Mitral valve                           Value        Reference Mitral E-wave peak velocity            80    cm/s   ---------- Mitral A-wave peak velocity            85.9  cm/s   ---------- Mitral deceleration time               201   ms     150 - 230 Mitral peak gradient, D                3     mm Hg  ---------- Mitral E/A ratio, peak                 0.9          ----------  Pulmonary arteries                     Value        Reference PA pressure, S, DP             (  H)     43    mm Hg  <=30  Tricuspid valve                        Value        Reference Tricuspid regurg peak velocity         294   cm/s   ---------- Tricuspid peak RV-RA gradient          35    mm Hg   ----------  Right atrium                           Value        Reference RA ID, S-I, ES, A4C            (H)     52.5  mm     34 - 49 RA area, ES, A4C                       12.9  cm^2   8.3 - 19.5 RA volume, ES, A/L                     26.6  ml     ---------- RA volume/bsa, ES, A/L                 13    ml/m^2 ----------  Systemic veins                         Value        Reference Estimated CVP                          8     mm Hg  ----------  Right ventricle                        Value        Reference TAPSE                                  21.2  mm     ---------- RV s&', lateral, S                      16.6  cm/s   ----------  Legend: (L)  and  (H)  mark values outside specified reference range.  ------------------------------------------------------------------- Prepared and Electronically Authenticated by  Marca Ancona, M.D. 2018-12-16T13:15:25          ______________________________________________________________________________________________      Laboratory Data:  Chemistry Recent Labs  Lab 11/04/23 1619  NA 137  K 4.5  CL 100  CO2 28  GLUCOSE 116*  BUN 17  CREATININE 0.84  CALCIUM 8.8*  GFRNONAA >60  ANIONGAP 9    No results for input(s): "PROT", "ALBUMIN", "AST", "ALT", "ALKPHOS", "BILITOT" in the last 168 hours. Hematology Recent Labs  Lab 11/04/23 1619  WBC 11.5*  RBC 4.06  HGB 13.4  HCT 40.8  MCV 100.5*  MCH 33.0  MCHC 32.8  RDW 14.8  PLT 332   Cardiac EnzymesNo results for input(s): "TROPONINI" in the last 168 hours. No results for input(s): "TROPIPOC" in the last 168 hours.  BNP Recent Labs  Lab 11/04/23 1619  BNP 629.3*  DDimer No results for input(s): "DDIMER" in the last 168 hours.   Assessment and Plan:   Atrial Fibrillation with Rapid Ventricular Response New onset atrial fibrillation with rapid ventricular response, heart rates in the 150s, likely triggered by a recent cortisone injection in the knee. Symptoms  include dyspnea, dizziness, palpitations, and near syncope. Risk factors include age over 65, female gender, and diastolic dysfunction. Primary goals are stroke risk reduction and heart rate control. If heart rate is controlled under 110 and symptoms improve, an echocardiogram will assess heart function. Persistent symptoms despite medication may necessitate cardioversion. Stroke risk is significant with cardioversion, especially if symptoms exceed a couple of days, requiring a transesophageal echocardiogram to exclude thrombi before proceeding. - Initiate anticoagulation therapy to reduce stroke risk (discussed with ED, heparin started) - Start diltiazem drip to control heart rate - Order echocardiogram tomorrow o assess heart function if heart rate is controlled under 110. - Advise fasting after midnight for potential procedure.  I have consented her for a TEE/DCCV as I suspect she may be still symptomatic with rate control  [facility] to perform a transesophageal echocardiogram  and cardioversion on Michelle Wilcox for atrial fibrillation with a rapid ventricular response. .  After careful review of history and examination, the risks and benefits of transesophageal echocardiogram have been explained including risks of esophageal damage, perforation (1:10,000 risk), bleeding, pharyngeal hematoma as well as other potential complications associated with conscious sedation including aspiration, arrhythmia, respiratory failure and death. Discussed stroke and arrhythmia risk of cardioversion. Alternatives to treatment were discussed, questions were answered. Patient is willing to proceed.   Diastolic Dysfunction Well-controlled diastolic dysfunction without heart failure. Previous echocardiogram showed mild mitral regurgitation. Monitoring is necessary to ensure heart function remains stable during atrial fibrillation management. - euvolmic at this time, will hold home lasix  SOB with knee  immobility New onset dyspnea and chest pain following knee injection raises suspicion for pulmonary embolism, especially given recent immobility. CTPE is pending to rule out this condition. - Order CT pulmonary angiogram to rule out pulmonary embolism.  Thyroid Dysfunction Evaluation  Thyroid studies are pending to rule out thyroid dysfunction as a contributing factor to atrial fibrillation.  Leukocytosis Slight elevation in white blood cell count, likely transient leukocytosis secondary to knee injection. Monitoring is required to ensure resolution. - Monitor white blood cell count.   For questions or updates, please contact [facility] Please consult [email or web address] for contact info under Cardiology/STEMI.   Riley Lam, MD FASE [facility] Cardiologist [facility]  [address] [place], [address] [place], [place] [address] [phone]  6:13 PM

## 2023-11-04 NOTE — ED Triage Notes (Signed)
 PT BIB [facility]  from her doctors office while experiencing SOB and dizziness. PT's HR was varying between 150-170, in A-fib, given 10mg  Cardizem en route. PT received cortizone injection in her knee in the past week for a recent fall. PT aox4, denies CP.  [facility] vitals 98% O2 on RA, BP 126/78, [id number]

## 2023-11-04 NOTE — ED Provider Notes (Signed)
 [place] [facility] AT [facility] Provider Note   CSN: [id number] Arrival date & time: 11/04/23  1614     History  Chief Complaint  Patient presents with   Atrial Fibrillation    Michelle Wilcox is a 78 y.o. female with a history as below who presents to the ED from PCP in A-fib with RVR with rates of 120s-170s.  Patient reports approximately 1.5 weeks ago, she fell onto her right knee.  She then had a cortisone shot in her right knee for pain control as she was not ready for knee replacement at this time.  She had 2 episodes of watery diarrhea approximately 1 week ago after taking 2 Imodium with concern for constipation.  Patient has no other sick symptoms.  Around 1 week ago, she started feeling generalized fatigue, shortness of breath on any exertion.  Denies chest pain.  Endorses palpitations on occasion.  Scheduled PCP appointment to discuss the symptoms of generalized fatigue.  At PCP appointment, found to be in A-fib with RVR with no prior history.  Given 10 mg of Cardizem and route with rates 140s and 150s  Past medical history significant for HTN, CHF, Hyperlipidemia.   Atrial Fibrillation     Home Medications Prior to Admission medications   Medication Sig Start Date End Date Taking? Authorizing Provider  acetaminophen (TYLENOL) 650 MG CR tablet Take 1,300 mg by mouth 2 (two) times daily.    [provider]  ALPRAZolam Prudy Feeler) 0.25 MG tablet Take 0.25 mg by mouth 2 (two) times daily as needed. 04/16/22   [provider]  diphenhydrAMINE (BENADRYL) 25 MG tablet Take 25 mg by mouth 4 (four) times daily as needed. 01/06/22   [provider]  folic acid (FOLVITE) 1 MG tablet TAKE 1 TABLET(1 MG) BY MOUTH DAILY 06/12/23   Ladene Artist, MD  furosemide (LASIX) 40 MG tablet Take 20-40 mg by mouth daily.    [provider]  ibandronate (BONIVA) 150 MG tablet Take 150 mg by mouth every 30 (thirty) days. Take in the  morning with a full glass of water, on an empty stomach, and do not take anything else by mouth or lie down for the next 30 min.    [provider]  lactase (LACTAID) 3000 units tablet Take 6,000-9,000 Units by mouth as needed (consuming dairy products).    [provider]  loperamide (IMODIUM A-D) 2 MG tablet Take 2 mg by mouth as needed for diarrhea or loose stools (takes 2 if needed).    [provider]  loratadine (CLARITIN) 10 MG tablet Take 10 mg by mouth daily.    [provider]  metoprolol tartrate (LOPRESSOR) 50 MG tablet Take 25-50 mg by mouth See admin instructions. Take 1 tablet (50 mg total) by mouth every morning AND take 0.5 tablet (25 mg total) by mouth every evening.    [provider]  Multiple Vitamin (MULTIVITAMIN) tablet Take 1 tablet by mouth every evening.     [provider]  potassium chloride SA (K-DUR,KLOR-CON) 20 MEQ tablet Take 10-20 mEq by mouth daily.    [provider]  predniSONE (DELTASONE) 5 MG tablet Take 1 tablet (5 mg total) by mouth daily with breakfast. 10/02/23   Ladene Artist, MD  Probiotic Product (ALIGN) 4 MG CAPS Take 4 mg by mouth every morning.    [provider]  rosuvastatin (CRESTOR) 5 MG tablet Take 1 tablet by mouth every other day.  [provider]  simethicone (MYLICON) 80 MG chewable tablet Chew 80 mg by mouth every 6 (six) hours as needed for flatulence.    [provider]  VENTOLIN HFA 108 (90 Base) MCG/ACT inhaler Take 1-2 puffs by mouth every 4 (four) hours as needed for shortness of breath. 06/24/17   [provider]  Vitamin D3 (VITAMIN D) 25 MCG tablet Take 1,000 Units by mouth every other day.    [provider]      Allergies    Penicillins, Almond (diagnostic), Ceftin [cefuroxime axetil], Ciprofloxacin, Darvocet [propoxyphene n-acetaminophen], Lactose intolerance (gi), Levaquin [levofloxacin in d5w], Sulfa antibiotics,  Sulfamethoxazole-trimethoprim, Zyrtec [cetirizine hcl], and Other    Review of Systems   Review of Systems  Physical Exam Updated Vital Signs BP (!) 123/106   Pulse (!) 136   Temp 98.5 F (36.9 C) (Oral)   Resp (!) 22   Ht 5\' 4"  (1.626 m)   Wt 84.8 kg   SpO2 99%   BMI 32.10 kg/m  Physical Exam Vitals and nursing note reviewed.  Constitutional:      General: She is not in acute distress.    Appearance: Normal appearance. She is not ill-appearing.  HENT:     Head: Normocephalic and atraumatic.  Eyes:     Conjunctiva/sclera: Conjunctivae normal.     Pupils: Pupils are equal, round, and reactive to light.  Cardiovascular:     Rate and Rhythm: Tachycardia present. Rhythm irregularly irregular.     Pulses:          Radial pulses are 2+ on the right side and 2+ on the left side.     Comments: RLE in knee immobilizer Pulmonary:     Effort: Pulmonary effort is normal.     Breath sounds: Normal breath sounds.  Abdominal:     Palpations: Abdomen is soft.     Tenderness: There is no abdominal tenderness.  Skin:    General: Skin is warm and dry.     Capillary Refill: Capillary refill takes less than 2 seconds.  Neurological:     Mental Status: She is alert.  Psychiatric:        Behavior: Behavior is cooperative.     ED Results / Procedures / Treatments   Labs (all labs ordered are listed, but only abnormal results are displayed) Labs Reviewed  BASIC METABOLIC PANEL - Abnormal; Notable for the following components:      Result Value   Glucose, Bld 116 (*)    Calcium 8.8 (*)    All other components within normal limits  CBC - Abnormal; Notable for the following components:   WBC 11.5 (*)    MCV 100.5 (*)    All other components within normal limits  BRAIN NATRIURETIC PEPTIDE - Abnormal; Notable for the following components:   B Natriuretic Peptide 629.3 (*)    All other components within normal limits  T4, FREE - Abnormal; Notable for the following components:   Free  T4 1.17 (*)    All other components within normal limits  TSH  HEPARIN LEVEL (UNFRACTIONATED)  CBC  TROPONIN I (HIGH SENSITIVITY)  TROPONIN I (HIGH SENSITIVITY)    EKG None  Radiology CT Angio Chest Pulmonary Embolism (PE) W or WO Contrast Result Date: 11/04/2023 CLINICAL DATA:  Suspected pulmonary embolism. EXAM: CT ANGIOGRAPHY CHEST WITH CONTRAST TECHNIQUE: Multidetector CT imaging of the chest was performed using the standard protocol during bolus administration of intravenous contrast. Multiplanar CT image reconstructions and MIPs were obtained  to evaluate the vascular anatomy. RADIATION DOSE REDUCTION: This exam was performed according to the departmental dose-optimization program which includes automated exposure control, adjustment of the mA and/or kV according to patient size and/or use of iterative reconstruction technique. CONTRAST:  75mL OMNIPAQUE IOHEXOL 350 MG/ML SOLN COMPARISON:  None Available. FINDINGS: Cardiovascular: There is moderate severity calcification of the aortic arch, without evidence of aortic aneurysm. Satisfactory opacification of the pulmonary arteries to the segmental level. No evidence of pulmonary embolism. There is mild cardiomegaly. No pericardial effusion. Mediastinum/Nodes: Mild pretracheal lymphadenopathy is seen. Thyroid gland, trachea, and esophagus demonstrate no significant findings. Lungs/Pleura: Mild lingular, posterolateral right middle lobe and posterior bilateral lower lobe scarring and/or atelectasis is seen, left greater than right. There is a small left pleural effusion. No pneumothorax is identified. Upper Abdomen: Noninflamed diverticula are seen throughout the splenic flexure. Musculoskeletal: No chest wall abnormality. No acute or significant osseous findings. Review of the MIP images confirms the above findings. IMPRESSION: 1. No evidence of pulmonary embolism. 2. Mild lingular, right middle lobe and posterior bilateral lower lobe scarring and/or  atelectasis, left greater than right. 3. Small left pleural effusion. 4. Colonic diverticulosis. 5. Aortic atherosclerosis. Aortic Atherosclerosis (ICD10-I70.0). Electronically Signed   By: Aram Candela M.D.   On: 11/04/2023 21:23   DG Chest Port 1 View Result Date: 11/04/2023 CLINICAL DATA:  Atrial fibrillation EXAM: PORTABLE CHEST 1 VIEW COMPARISON:  02/13/2023 FINDINGS: Mild cardiomegaly. No confluent airspace opacities, effusions or edema. Minimal linear densities in the lung bases, favor atelectasis. No acute bony abnormality. IMPRESSION: Cardiomegaly.  No overt edema. Minimal bibasilar atelectasis. Electronically Signed   By: Charlett Nose M.D.   On: 11/04/2023 17:23    Procedures Procedures    Medications Ordered in ED Medications  diltiazem (CARDIZEM) 1 mg/mL load via infusion 10 mg (10 mg Intravenous Bolus from Bag 11/04/23 1727)    And  diltiazem (CARDIZEM) 125 mg in dextrose 5% 125 mL (1 mg/mL) infusion (10 mg/hr Intravenous Rate/Dose Change 11/04/23 1834)  heparin ADULT infusion 100 units/mL (25000 units/212mL) (1,100 Units/hr Intravenous New Bag/Given 11/04/23 1848)  lactated ringers bolus 1,000 mL (0 mLs Intravenous Stopped 11/04/23 1933)  heparin bolus via infusion 3,500 Units (3,500 Units Intravenous Bolus from Bag 11/04/23 1844)  iohexol (OMNIPAQUE) 350 MG/ML injection 75 mL (75 mLs Intravenous Contrast Given 11/04/23 2034)    ED Course/ Medical Decision Making/ A&P                                Medical Decision Making Amount and/or Complexity of Data Reviewed Labs: ordered. Radiology: ordered.  Risk Prescription drug management. Decision regarding hospitalization.   78 y.o. female with a history as below who presents to the ED from PCP in A-fib with RVR with rates of 120s-170s.  Patient received 10 mg of diltiazem with EMS with improvement in rates to 120s-150s.  For her rates, patient did not cardiovert with initial diltiazem, thus will initiate diltiazem infusion  with completion of bolus prior.  Patient is not on anticoagulation, thus will initiate heparin drip.  For evaluation of cause of new onset A-fib, could be cortisone injection, could also be new pulmonary embolism given patient's recent immobilization after fall, thus we will order a CTA PE.  Will also order labs to evaluate thyroid studies, BNP to evaluate for volume overload secondary to heart failure, and troponin for ACS evaluation.  Patient was discussed with Cardiologist Dr Izora Ribas who  will see her and follow her while she is admitted.  Labs notable for low end leukocytosis 11.5, stable hemoglobin, no electrolyte or metabolic derangements, appropriate glucose, renal function WNL.  TSH and free T4 appropriate.  Troponin 14.  EKG is notable for atrial fibrillation with rapid ventricular rate at 147 bpm.  There is no evidence of ST elevation.  With EKG and troponin, low suspicion for ACS.  CTA PE notable for no evidence of pulmonary embolism.  Small left pleural effusion.  Patient will require admission given new onset A-fib, symptoms for a week, and a lack of anticoagulation.  Patient on a diltiazem drip and heparin drip.  Discussed the patient with Dr. Toniann Fail who will be admitting the patient.  Patient remained stable and drips while in the ED and required no further emergent intervention  Final Clinical Impression(s) / ED Diagnoses Final diagnoses:  Atrial fibrillation with RVR [facility])    Rx / DC Orders ED Discharge Orders     None      Renella Cunas, pgy-2 Emergency medicine    Renella Cunas, MD 11/04/23 2147    Glendora Score, MD 11/05/23 1246

## 2023-11-04 NOTE — Progress Notes (Signed)
 ANTICOAGULATION CONSULT NOTE  Pharmacy Consult for Heparin Indication: atrial fibrillation  Allergies  Allergen Reactions   Penicillins Anaphylaxis    Has patient had a PCN reaction causing immediate rash, facial/tongue/throat swelling, SOB or lightheadedness with hypotension: yes Has patient had a PCN reaction causing severe rash involving mucus membranes or skin necrosis: no Has patient had a PCN reaction that required hospitalization: yes Has patient had a PCN reaction occurring within the last 10 years: no If all of the above answers are "NO", then may proceed with Cephalosporin use.    Almond (Diagnostic) Hives   Ceftin [Cefuroxime Axetil] Hives    headaches   Ciprofloxacin Hives    headaches   Darvocet [Propoxyphene N-Acetaminophen]     headaches   Lactose Intolerance (Gi) Diarrhea   Levaquin [Levofloxacin In D5w] Hives   Sulfa Antibiotics Hives   Sulfamethoxazole-Trimethoprim     Other reaction(s): Unknown   Zyrtec [Cetirizine Hcl]     headache   Other     Powder in gloves - Rash  MSG - Hives  Swiss Cheese - Hives       Patient Measurements: Height: 5\' 4"  (162.6 cm) Weight: 84.8 kg (187 lb) IBW/kg (Calculated) : 54.7 Heparin Dosing Weight: 73.3 kg  Vital Signs: Temp: 98.2 F (36.8 C) (03/17 1624) Temp Source: Oral (03/17 1624) BP: 140/90 (03/17 1624) Pulse Rate: 153 (03/17 1624)  Labs: Recent Labs    11/04/23 1619  HGB 13.4  HCT 40.8  PLT 332    CrCl cannot be calculated (Patient's most recent lab result is older than the maximum 21 days allowed.).   Medical History: Past Medical History:  Diagnosis Date   Anxiety    hx of   Asthma    at times   CHF (congestive heart failure) (HCC)    Depression    hx of   Dyspnea    Giant cell arteritis (HCC)    Gouty arthropathy    Hemolytic anemia (HCC)    Hyperlipidemia    Hypertension    Irregular heart beat    extra beat   Migraines    Polymyalgia (HCC)    Polymyalgia rheumatica (HCC)     Temporal arteritis (HCC)     Medications:  (Not in a hospital admission)  Scheduled:   diltiazem  10 mg Intravenous Once   Infusions:   diltiazem (CARDIZEM) infusion     lactated ringers     PRN:   Assessment: 72 yof with a history of HTN, HF, HLD. Patient is presenting with SOB and dizziness. Noted to be in AF RVR in the ED. Heparin per pharmacy consult placed for atrial fibrillation.  Patient is not on anticoagulation prior to arrival.  Hgb 13.4; plt 332  Goal of Therapy:  Heparin level 0.3-0.7 units/ml Monitor platelets by anticoagulation protocol: Yes   Plan:  Give IV heparin 3500 units bolus x 1 Start heparin infusion at 1100 units/hr Check anti-Xa level in 8 hours and daily while on heparin Continue to monitor H&H and platelets  Delmar Landau, PharmD, BCPS 11/04/2023 5:24 PM ED Clinical Pharmacist -  [phone]

## 2023-11-04 NOTE — H&P (Signed)
 History and Physical    Michelle Wilcox ZOX:[id number] DOB: 13-Aug-1946 DOA: 11/04/2023  Patient coming from: Home.  Chief Complaint: Elevated heart rate.  HPI: Michelle Wilcox is a 78 y.o. female with history of polymyalgia rheumatica, temporal arteritis, autoimmune hemolytic anemia status post splenectomy, diastolic dysfunction, hypertension has been feeling weak tired and fatigued and occasional fluttering sensation in her chest over the last 2 weeks since she got cortisone shot for her right knee.  Due to the symptoms patient presented to her primary care physician and at primary care physician's office patient was found to be tachycardic and in A-fib and was referred to the ER.  Patient denies any chest pain productive cough fever or chills.  ED Course: In the ER patient was in A-fib with RVR and was evaluated by cardiology started on heparin infusion and Cardizem infusion.  CT angiogram of the chest was negative for PE.  Did show some pleural effusion and atelectasis.  Thyroid function test was mildly elevated free T41.17 But TSH was 1.26.  Troponins were negative BNP 629.  Patient admitted for new onset A-fib with RVR.  Review of Systems: As per HPI, rest all negative.   Past Medical History:  Diagnosis Date   Anxiety    hx of   Asthma    at times   CHF (congestive heart failure) (HCC)    Depression    hx of   Dyspnea    Giant cell arteritis (HCC)    Gouty arthropathy    Hemolytic anemia (HCC)    Hyperlipidemia    Hypertension    Irregular heart beat    extra beat   Migraines    Polymyalgia (HCC)    Polymyalgia rheumatica (HCC)    Temporal arteritis (HCC)     Past Surgical History:  Procedure Laterality Date   BARTHOLIN GLAND CYST EXCISION     non ca   BREAST BIOPSY Right    EYE MUSCLE SURGERY     as a child 3 yrs old   KNEE SURGERY  2004   rt knee   LAPAROSCOPIC SPLENECTOMY N/A 06/18/2018   Procedure: LAPAROSCOPIC SPLENECTOMY ERAS PATHWAY;   Surgeon: Glenna Fellows, MD;  Location: [facility];  Service: General;  Laterality: N/A;   toncil     TONSILLECTOMY     as a child     reports that she has never smoked. She has never used smokeless tobacco. She reports current alcohol use of about 1.0 standard drink of alcohol per week. She reports that she does not use drugs.  Allergies  Allergen Reactions   Penicillins Anaphylaxis    Has patient had a PCN reaction causing immediate rash, facial/tongue/throat swelling, SOB or lightheadedness with hypotension: yes Has patient had a PCN reaction causing severe rash involving mucus membranes or skin necrosis: no Has patient had a PCN reaction that required hospitalization: yes Has patient had a PCN reaction occurring within the last 10 years: no If all of the above answers are "NO", then may proceed with Cephalosporin use.    Almond (Diagnostic) Hives   Ceftin [Cefuroxime Axetil] Hives    headaches   Ciprofloxacin Hives    headaches   Darvocet [Propoxyphene N-Acetaminophen]     headaches   Lactose Intolerance (Gi) Diarrhea   Levaquin [Levofloxacin In D5w] Hives   Sulfa Antibiotics Hives   Sulfamethoxazole-Trimethoprim     Other reaction(s): Unknown   Zyrtec [Cetirizine Hcl]     headache   Other  Powder in gloves - Rash  MSG - Hives  Swiss Cheese - Hives       Family History  Adopted: Yes  Problem Relation Age of Onset   Arthritis Mother    Stroke Mother    Other Mother        Splenectomy due to unknown reason.    Prior to Admission medications   Medication Sig Start Date End Date Taking? Authorizing Provider  acetaminophen (TYLENOL) 650 MG CR tablet Take 1,300 mg by mouth 2 (two) times daily.    [provider]  ALPRAZolam Prudy Feeler) 0.25 MG tablet Take 0.25 mg by mouth 2 (two) times daily as needed. 04/16/22   [provider]  diphenhydrAMINE (BENADRYL) 25 MG tablet Take 25 mg by mouth 4 (four) times daily as needed. 01/06/22   [provider]  folic acid (FOLVITE) 1 MG tablet TAKE 1 TABLET(1 MG) BY MOUTH DAILY 06/12/23   Ladene Artist, MD  furosemide (LASIX) 40 MG tablet Take 20-40 mg by mouth daily.    [provider]  ibandronate (BONIVA) 150 MG tablet Take 150 mg by mouth every 30 (thirty) days. Take in the morning with a full glass of water, on an empty stomach, and do not take anything else by mouth or lie down for the next 30 min.    [provider]  lactase (LACTAID) 3000 units tablet Take 6,000-9,000 Units by mouth as needed (consuming dairy products).    [provider]  loperamide (IMODIUM A-D) 2 MG tablet Take 2 mg by mouth as needed for diarrhea or loose stools (takes 2 if needed).    [provider]  loratadine (CLARITIN) 10 MG tablet Take 10 mg by mouth daily.    [provider]  metoprolol tartrate (LOPRESSOR) 50 MG tablet Take 25-50 mg by mouth See admin instructions. Take 1 tablet (50 mg total) by mouth every morning AND take 0.5 tablet (25 mg total) by mouth every evening.    [provider]  Multiple Vitamin (MULTIVITAMIN) tablet Take 1 tablet by mouth every evening.     [provider]  potassium chloride SA (K-DUR,KLOR-CON) 20 MEQ tablet Take 10-20 mEq by mouth daily.    [provider]  predniSONE (DELTASONE) 5 MG tablet Take 1 tablet (5 mg total) by mouth daily with breakfast. 10/02/23   Ladene Artist, MD  Probiotic Product (ALIGN) 4 MG CAPS Take 4 mg by mouth every morning.    [provider]  rosuvastatin (CRESTOR) 5 MG tablet Take 1 tablet by mouth every other day.    [provider]  simethicone (MYLICON) 80 MG chewable tablet Chew 80 mg by mouth every 6 (six) hours as needed for flatulence.    [provider]  VENTOLIN HFA 108 (90 Base) MCG/ACT inhaler Take 1-2 puffs by mouth every 4 (four) hours as needed for shortness of breath. 06/24/17   [provider]  Vitamin D3 (VITAMIN D) 25 MCG  tablet Take 1,000 Units by mouth every other day.    [provider]    Physical Exam: Constitutional: Moderately built and nourished. Vitals:   11/04/23 1845 11/04/23 1915 11/04/23 2000 11/04/23 2023  BP: (!) 156/128 130/89 (!) 123/106   Pulse: (!) 125 (!) 139 (!) 136   Resp: 18 (!) 22 (!) 22   Temp:    98.5 F (36.9 C)  TempSrc:    Oral  SpO2: 99% 99% 99%   Weight:  Height:       Eyes: Anicteric no pallor. ENMT: No discharge from the ears eyes nose or mouth. Neck: No mass felt.  No neck rigidity. Respiratory: No rhonchi or crepitations. Cardiovascular: S1-S2 heard. Abdomen: Soft nontender bowel sound present. Musculoskeletal: No edema. Skin: No rash. Neurologic: Alert awake oriented to time place and person.  Moves all extremities. Psychiatric: Appears normal.  Normal affect.   Labs on Admission: I have personally reviewed following labs and imaging studies  CBC: Recent Labs  Lab 11/04/23 1619  WBC 11.5*  HGB 13.4  HCT 40.8  MCV 100.5*  PLT 332   Basic Metabolic Panel: Recent Labs  Lab 11/04/23 1619  NA 137  K 4.5  CL 100  CO2 28  GLUCOSE 116*  BUN 17  CREATININE 0.84  CALCIUM 8.8*   GFR: Estimated Creatinine Clearance: 58.1 mL/min (by C-G formula based on SCr of 0.84 mg/dL). Liver Function Tests: No results for input(s): "AST", "ALT", "ALKPHOS", "BILITOT", "PROT", "ALBUMIN" in the last 168 hours. No results for input(s): "LIPASE", "AMYLASE" in the last 168 hours. No results for input(s): "AMMONIA" in the last 168 hours. Coagulation Profile: No results for input(s): "INR", "PROTIME" in the last 168 hours. Cardiac Enzymes: No results for input(s): "CKTOTAL", "CKMB", "CKMBINDEX", "TROPONINI" in the last 168 hours. BNP (last 3 results) No results for input(s): "PROBNP" in the last 8760 hours. HbA1C: No results for input(s): "HGBA1C" in the last 72 hours. CBG: No results for input(s): "GLUCAP" in the last 168 hours. Lipid Profile: No  results for input(s): "CHOL", "HDL", "LDLCALC", "TRIG", "CHOLHDL", "LDLDIRECT" in the last 72 hours. Thyroid Function Tests: Recent Labs    11/04/23 1619  TSH 1.266  FREET4 1.17*   Anemia Panel: No results for input(s): "VITAMINB12", "FOLATE", "FERRITIN", "TIBC", "IRON", "RETICCTPCT" in the last 72 hours. Urine analysis:    Component Value Date/Time   COLORURINE YELLOW 08/03/2017 2321   APPEARANCEUR CLEAR 08/03/2017 2321   LABSPEC 1.020 08/03/2017 2321   LABSPEC 1.025 03/27/2016 0816   PHURINE 5.5 08/03/2017 2321   GLUCOSEU NEGATIVE 08/03/2017 2321   GLUCOSEU Negative 03/27/2016 0816   HGBUR NEGATIVE 08/03/2017 2321   BILIRUBINUR NEGATIVE 08/03/2017 2321   BILIRUBINUR Positive by Dipstix 03/27/2016 0816   KETONESUR TRACE (A) 08/03/2017 2321   PROTEINUR NEGATIVE 08/03/2017 2321   UROBILINOGEN 0.2 03/27/2016 0816   NITRITE NEGATIVE 08/03/2017 2321   LEUKOCYTESUR NEGATIVE 08/03/2017 2321   LEUKOCYTESUR Small 03/27/2016 0816   Sepsis Labs: @LABRCNTIP (procalcitonin:4,lacticidven:4) )No results found for this or any previous visit (from the past 240 hours).   Radiological Exams on Admission: CT Angio Chest Pulmonary Embolism (PE) W or WO Contrast Result Date: 11/04/2023 CLINICAL DATA:  Suspected pulmonary embolism. EXAM: CT ANGIOGRAPHY CHEST WITH CONTRAST TECHNIQUE: Multidetector CT imaging of the chest was performed using the standard protocol during bolus administration of intravenous contrast. Multiplanar CT image reconstructions and MIPs were obtained to evaluate the vascular anatomy. RADIATION DOSE REDUCTION: This exam was performed according to the departmental dose-optimization program which includes automated exposure control, adjustment of the mA and/or kV according to patient size and/or use of iterative reconstruction technique. CONTRAST:  75mL OMNIPAQUE IOHEXOL 350 MG/ML SOLN COMPARISON:  None Available. FINDINGS: Cardiovascular: There is moderate severity calcification of  the aortic arch, without evidence of aortic aneurysm. Satisfactory opacification of the pulmonary arteries to the segmental level. No evidence of pulmonary embolism. There is mild cardiomegaly. No pericardial effusion. Mediastinum/Nodes: Mild pretracheal lymphadenopathy is seen. Thyroid gland, trachea, and esophagus demonstrate no  significant findings. Lungs/Pleura: Mild lingular, posterolateral right middle lobe and posterior bilateral lower lobe scarring and/or atelectasis is seen, left greater than right. There is a small left pleural effusion. No pneumothorax is identified. Upper Abdomen: Noninflamed diverticula are seen throughout the splenic flexure. Musculoskeletal: No chest wall abnormality. No acute or significant osseous findings. Review of the MIP images confirms the above findings. IMPRESSION: 1. No evidence of pulmonary embolism. 2. Mild lingular, right middle lobe and posterior bilateral lower lobe scarring and/or atelectasis, left greater than right. 3. Small left pleural effusion. 4. Colonic diverticulosis. 5. Aortic atherosclerosis. Aortic Atherosclerosis (ICD10-I70.0). Electronically Signed   By: Aram Candela M.D.   On: 11/04/2023 21:23   DG Chest Port 1 View Result Date: 11/04/2023 CLINICAL DATA:  Atrial fibrillation EXAM: PORTABLE CHEST 1 VIEW COMPARISON:  02/13/2023 FINDINGS: Mild cardiomegaly. No confluent airspace opacities, effusions or edema. Minimal linear densities in the lung bases, favor atelectasis. No acute bony abnormality. IMPRESSION: Cardiomegaly.  No overt edema. Minimal bibasilar atelectasis. Electronically Signed   By: Charlett Nose M.D.   On: 11/04/2023 17:23    EKG: Independently reviewed.  A-fib with RVR.  Assessment/Plan Principal Problem:   Atrial fibrillation (HCC) Active Problems:   Autoimmune hemolytic anemia (HCC)   Essential hypertension   Diastolic dysfunction without heart failure   Temporal arteritis (HCC)   PMR (polymyalgia rheumatica) (HCC)     A-fib with RVR new onset with CHADS2 Vascor of at least 3.  Patient has been started on a heparin infusion and Cardizem infusion.  Cardiology planning transesophageal echocardiogram and cardioversion tomorrow morning and in anticipation of that patient will be n.p.o. past midnight. History of diastolic dysfunction.  2D echo done in 2018 showed EF of 60 to 65% with grade 1 diastolic dysfunction.  Takes Lasix 20 mg daily. Abnormal thyroid function test with free T4 mildly elevated and TSH 1.26.  Need to repeat thyroid function test as outpatient in few weeks. History of polymyalgia rheumatica and temporal arteritis and autoimmune hemolytic anemia status post splenectomy takes prednisone 5 mg daily follows with rheumatologist.  I have ordered 1 dose of Solu-Medrol 20 mg IV since patient is n.p.o. in the morning.  Resume oral dose from the following day. Hypertension takes Lasix and Toprol.  Presently on Cardizem. Macrocytosis check B12 levels and folate levels. Hyperlipidemia takes Crestor alternate days.  This patient has new onset A-fib with RVR and is planned cardioversion will need close monitoring and more than 2 midnight stay.   DVT prophylaxis: Heparin infusion. Code Status: Full code. Family Communication: Discussed with patient. Disposition Plan: Progressive care. Consults called: Cardiology. Admission status: Observation.

## 2023-11-05 ENCOUNTER — Observation Stay (HOSPITAL_COMMUNITY): Admitting: Certified Registered"

## 2023-11-05 ENCOUNTER — Encounter (HOSPITAL_COMMUNITY): Payer: Self-pay | Admitting: Internal Medicine

## 2023-11-05 ENCOUNTER — Observation Stay (HOSPITAL_COMMUNITY)

## 2023-11-05 ENCOUNTER — Encounter (HOSPITAL_COMMUNITY)
Admission: EM | Disposition: A | Discharge status: Home / Self Care | Payer: Self-pay | Source: Ambulatory Visit | Attending: Internal Medicine

## 2023-11-05 DIAGNOSIS — I4891 Unspecified atrial fibrillation: Secondary | ICD-10-CM

## 2023-11-05 DIAGNOSIS — I3139 Other pericardial effusion (noninflammatory): Secondary | ICD-10-CM | POA: Diagnosis present

## 2023-11-05 DIAGNOSIS — D72829 Elevated white blood cell count, unspecified: Secondary | ICD-10-CM | POA: Diagnosis present

## 2023-11-05 DIAGNOSIS — I110 Hypertensive heart disease with heart failure: Secondary | ICD-10-CM | POA: Diagnosis present

## 2023-11-05 DIAGNOSIS — I480 Paroxysmal atrial fibrillation: Secondary | ICD-10-CM | POA: Diagnosis not present

## 2023-11-05 DIAGNOSIS — Z7901 Long term (current) use of anticoagulants: Secondary | ICD-10-CM | POA: Diagnosis not present

## 2023-11-05 DIAGNOSIS — Z888 Allergy status to other drugs, medicaments and biological substances status: Secondary | ICD-10-CM | POA: Diagnosis not present

## 2023-11-05 DIAGNOSIS — E66811 Obesity, class 1: Secondary | ICD-10-CM | POA: Diagnosis present

## 2023-11-05 DIAGNOSIS — I340 Nonrheumatic mitral (valve) insufficiency: Secondary | ICD-10-CM

## 2023-11-05 DIAGNOSIS — I509 Heart failure, unspecified: Secondary | ICD-10-CM

## 2023-11-05 DIAGNOSIS — I5021 Acute systolic (congestive) heart failure: Secondary | ICD-10-CM | POA: Diagnosis not present

## 2023-11-05 DIAGNOSIS — J45909 Unspecified asthma, uncomplicated: Secondary | ICD-10-CM

## 2023-11-05 DIAGNOSIS — Z6832 Body mass index (BMI) 32.0-32.9, adult: Secondary | ICD-10-CM | POA: Diagnosis not present

## 2023-11-05 DIAGNOSIS — D7589 Other specified diseases of blood and blood-forming organs: Secondary | ICD-10-CM | POA: Diagnosis present

## 2023-11-05 DIAGNOSIS — E0781 Sick-euthyroid syndrome: Secondary | ICD-10-CM | POA: Diagnosis present

## 2023-11-05 DIAGNOSIS — E785 Hyperlipidemia, unspecified: Secondary | ICD-10-CM | POA: Diagnosis present

## 2023-11-05 DIAGNOSIS — I5189 Other ill-defined heart diseases: Secondary | ICD-10-CM | POA: Diagnosis not present

## 2023-11-05 DIAGNOSIS — M353 Polymyalgia rheumatica: Secondary | ICD-10-CM

## 2023-11-05 DIAGNOSIS — M315 Giant cell arteritis with polymyalgia rheumatica: Secondary | ICD-10-CM | POA: Diagnosis present

## 2023-11-05 DIAGNOSIS — R946 Abnormal results of thyroid function studies: Secondary | ICD-10-CM | POA: Diagnosis not present

## 2023-11-05 DIAGNOSIS — M7989 Other specified soft tissue disorders: Secondary | ICD-10-CM | POA: Diagnosis present

## 2023-11-05 DIAGNOSIS — I5020 Unspecified systolic (congestive) heart failure: Secondary | ICD-10-CM | POA: Diagnosis not present

## 2023-11-05 DIAGNOSIS — I4819 Other persistent atrial fibrillation: Secondary | ICD-10-CM | POA: Diagnosis present

## 2023-11-05 DIAGNOSIS — F419 Anxiety disorder, unspecified: Secondary | ICD-10-CM | POA: Diagnosis present

## 2023-11-05 DIAGNOSIS — I10 Essential (primary) hypertension: Secondary | ICD-10-CM | POA: Diagnosis not present

## 2023-11-05 DIAGNOSIS — Z79899 Other long term (current) drug therapy: Secondary | ICD-10-CM | POA: Diagnosis not present

## 2023-11-05 DIAGNOSIS — Z880 Allergy status to penicillin: Secondary | ICD-10-CM | POA: Diagnosis not present

## 2023-11-05 DIAGNOSIS — I429 Cardiomyopathy, unspecified: Secondary | ICD-10-CM | POA: Diagnosis present

## 2023-11-05 DIAGNOSIS — Z7952 Long term (current) use of systemic steroids: Secondary | ICD-10-CM | POA: Diagnosis not present

## 2023-11-05 DIAGNOSIS — Z882 Allergy status to sulfonamides status: Secondary | ICD-10-CM | POA: Diagnosis not present

## 2023-11-05 DIAGNOSIS — I5043 Acute on chronic combined systolic (congestive) and diastolic (congestive) heart failure: Secondary | ICD-10-CM | POA: Diagnosis present

## 2023-11-05 DIAGNOSIS — F32A Depression, unspecified: Secondary | ICD-10-CM | POA: Diagnosis present

## 2023-11-05 DIAGNOSIS — I513 Intracardiac thrombosis, not elsewhere classified: Secondary | ICD-10-CM | POA: Diagnosis present

## 2023-11-05 DIAGNOSIS — D591 Autoimmune hemolytic anemia, unspecified: Secondary | ICD-10-CM | POA: Diagnosis present

## 2023-11-05 HISTORY — PX: CARDIOVERSION: EP1203

## 2023-11-05 HISTORY — PX: TRANSESOPHAGEAL ECHOCARDIOGRAM (CATH LAB): EP1270

## 2023-11-05 LAB — CBC WITH DIFFERENTIAL/PLATELET
Abs Immature Granulocytes: 0.06 10*3/uL (ref 0.00–0.07)
Basophils Absolute: 0.1 10*3/uL (ref 0.0–0.1)
Basophils Relative: 1 %
Eosinophils Absolute: 0.3 10*3/uL (ref 0.0–0.5)
Eosinophils Relative: 2 %
HCT: 37.7 % (ref 36.0–46.0)
Hemoglobin: 12.4 g/dL (ref 12.0–15.0)
Immature Granulocytes: 1 %
Lymphocytes Relative: 22 %
Lymphs Abs: 2.7 10*3/uL (ref 0.7–4.0)
MCH: 32.7 pg (ref 26.0–34.0)
MCHC: 32.9 g/dL (ref 30.0–36.0)
MCV: 99.5 fL (ref 80.0–100.0)
Monocytes Absolute: 1 10*3/uL (ref 0.1–1.0)
Monocytes Relative: 8 %
Neutro Abs: 8.6 10*3/uL — ABNORMAL HIGH (ref 1.7–7.7)
Neutrophils Relative %: 66 %
Platelets: 309 10*3/uL (ref 150–400)
RBC: 3.79 MIL/uL — ABNORMAL LOW (ref 3.87–5.11)
RDW: 14.9 % (ref 11.5–15.5)
WBC: 12.8 10*3/uL — ABNORMAL HIGH (ref 4.0–10.5)
nRBC: 0.2 % (ref 0.0–0.2)

## 2023-11-05 LAB — COMPREHENSIVE METABOLIC PANEL
ALT: 39 U/L (ref 0–44)
AST: 39 U/L (ref 15–41)
Albumin: 3.2 g/dL — ABNORMAL LOW (ref 3.5–5.0)
Alkaline Phosphatase: 57 U/L (ref 38–126)
Anion gap: 11 (ref 5–15)
BUN: 12 mg/dL (ref 8–23)
CO2: 24 mmol/L (ref 22–32)
Calcium: 8.3 mg/dL — ABNORMAL LOW (ref 8.9–10.3)
Chloride: 105 mmol/L (ref 98–111)
Creatinine, Ser: 0.77 mg/dL (ref 0.44–1.00)
GFR, Estimated: >60 mL/min (ref >60)
Glucose, Bld: 96 mg/dL (ref 70–99)
Potassium: 3.5 mmol/L (ref 3.5–5.1)
Sodium: 140 mmol/L (ref 135–145)
Total Bilirubin: 0.7 mg/dL (ref 0.0–1.2)
Total Protein: 5.7 g/dL — ABNORMAL LOW (ref 6.5–8.1)

## 2023-11-05 LAB — CBC
HCT: 37 % (ref 36.0–46.0)
Hemoglobin: 12.1 g/dL (ref 12.0–15.0)
MCH: 32.3 pg (ref 26.0–34.0)
MCHC: 32.7 g/dL (ref 30.0–36.0)
MCV: 98.7 fL (ref 80.0–100.0)
Platelets: 312 10*3/uL (ref 150–400)
RBC: 3.75 MIL/uL — ABNORMAL LOW (ref 3.87–5.11)
RDW: 14.9 % (ref 11.5–15.5)
WBC: 12.8 10*3/uL — ABNORMAL HIGH (ref 4.0–10.5)
nRBC: 0 % (ref 0.0–0.2)

## 2023-11-05 LAB — FOLATE: Folate: >40 ng/mL (ref >5.9)

## 2023-11-05 LAB — VITAMIN B12: Vitamin B-12: 602 pg/mL (ref 180–914)

## 2023-11-05 LAB — HEPARIN LEVEL (UNFRACTIONATED): Heparin Unfractionated: 0.35 [IU]/mL (ref 0.30–0.70)

## 2023-11-05 LAB — ECHO TEE

## 2023-11-05 LAB — MAGNESIUM: Magnesium: 1.9 mg/dL (ref 1.7–2.4)

## 2023-11-05 MED ORDER — METOPROLOL TARTRATE 25 MG PO TABS
25.0000 mg | ORAL_TABLET | Freq: Four times a day (QID) | ORAL | Status: DC
  Administered 2023-11-05 – 2023-11-06 (×2): 25 mg via ORAL
  Filled 2023-11-05 (×2): qty 1

## 2023-11-05 MED ORDER — MAGNESIUM SULFATE 2 GM/50ML IV SOLN: 2.0000 g | Freq: Once | INTRAVENOUS | Status: DC

## 2023-11-05 MED ORDER — DIGOXIN 125 MCG PO TABS
0.1250 mg | ORAL_TABLET | Freq: Every day | ORAL | Status: DC
  Administered 2023-11-06 – 2023-11-11 (×6): 0.125 mg via ORAL
  Filled 2023-11-05 (×6): qty 1

## 2023-11-05 MED ORDER — DIGOXIN 0.25 MG/ML IJ SOLN
0.2500 mg | Freq: Once | INTRAMUSCULAR | Status: AC
  Administered 2023-11-05: 0.25 mg via INTRAVENOUS
  Filled 2023-11-05: qty 1

## 2023-11-05 MED ORDER — ROSUVASTATIN CALCIUM 5 MG PO TABS
5.0000 mg | ORAL_TABLET | ORAL | Status: DC
  Administered 2023-11-05: 5 mg via ORAL
  Filled 2023-11-05: qty 1

## 2023-11-05 MED ORDER — FUROSEMIDE 40 MG PO TABS
40.0000 mg | ORAL_TABLET | Freq: Every day | ORAL | Status: DC
  Administered 2023-11-06 – 2023-11-07 (×2): 40 mg via ORAL
  Filled 2023-11-05 (×2): qty 1

## 2023-11-05 MED ORDER — PROPOFOL 10 MG/ML IV BOLUS
INTRAVENOUS | Status: DC | PRN
  Administered 2023-11-05: 10 mg via INTRAVENOUS
  Administered 2023-11-05: 20 mg via INTRAVENOUS
  Administered 2023-11-05: 40 mg via INTRAVENOUS
  Administered 2023-11-05: 20 mg via INTRAVENOUS
  Administered 2023-11-05: 50 mg via INTRAVENOUS
  Administered 2023-11-05: 40 mg via INTRAVENOUS
  Administered 2023-11-05: 80 mg via INTRAVENOUS
  Administered 2023-11-05: 30 mg via INTRAVENOUS

## 2023-11-05 MED ORDER — LORATADINE 10 MG PO TABS
10.0000 mg | ORAL_TABLET | Freq: Every day | ORAL | Status: DC
  Administered 2023-11-05 – 2023-11-11 (×7): 10 mg via ORAL
  Filled 2023-11-05 (×7): qty 1

## 2023-11-05 MED ORDER — METHYLPREDNISOLONE SODIUM SUCC 40 MG IJ SOLR
20.0000 mg | Freq: Once | INTRAMUSCULAR | Status: AC
  Administered 2023-11-05: 20 mg via INTRAVENOUS
  Filled 2023-11-05: qty 1

## 2023-11-05 MED ORDER — POTASSIUM CHLORIDE 10 MEQ/100ML IV SOLN
10.0000 meq | INTRAVENOUS | Status: AC
  Administered 2023-11-05 (×3): 10 meq via INTRAVENOUS
  Filled 2023-11-05 (×3): qty 100

## 2023-11-05 MED ORDER — APIXABAN 5 MG PO TABS
5.0000 mg | ORAL_TABLET | Freq: Two times a day (BID) | ORAL | Status: DC
  Administered 2023-11-05 – 2023-11-11 (×12): 5 mg via ORAL
  Filled 2023-11-05 (×12): qty 1

## 2023-11-05 MED ORDER — ROSUVASTATIN CALCIUM 5 MG PO TABS
5.0000 mg | ORAL_TABLET | ORAL | Status: DC
  Administered 2023-11-07 – 2023-11-09 (×2): 5 mg via ORAL
  Filled 2023-11-05 (×3): qty 1

## 2023-11-05 MED ORDER — SODIUM CHLORIDE 0.9 % IV SOLN: INTRAVENOUS | Status: DC | PRN

## 2023-11-05 MED ORDER — PREDNISONE 5 MG PO TABS
5.0000 mg | ORAL_TABLET | Freq: Every day | ORAL | Status: DC
  Administered 2023-11-06 – 2023-11-11 (×6): 5 mg via ORAL
  Filled 2023-11-05 (×6): qty 1

## 2023-11-05 MED ORDER — ORAL CARE MOUTH RINSE: 15.0000 mL | OROMUCOSAL | Status: DC | PRN

## 2023-11-05 MED ORDER — ROSUVASTATIN CALCIUM 5 MG PO TABS: 5.0000 mg | ORAL_TABLET | Freq: Every day | ORAL | Status: DC

## 2023-11-05 MED ORDER — LIDOCAINE 2% (20 MG/ML) 5 ML SYRINGE
INTRAMUSCULAR | Status: DC | PRN
  Administered 2023-11-05: 40 mg via INTRAVENOUS

## 2023-11-05 NOTE — Progress Notes (Signed)
   11/05/23 1959  Assess: MEWS Score  Temp 97.8 F (36.6 C)  BP 120/69  MAP (mmHg) 82  Pulse Rate 80  ECG Heart Rate 100  Resp 20  Level of Consciousness Alert  SpO2 93 %  O2 Device Room Air  Assess: MEWS Score  MEWS Temp 0  MEWS Systolic 0  MEWS Pulse 0  MEWS RR 0  MEWS LOC 0  MEWS Score 0  MEWS Score Color Green  Assess: if the MEWS score is Yellow or Red  Were vital signs accurate and taken at a resting state? Yes  Does the patient meet 2 or more of the SIRS criteria? No  MEWS guidelines implemented  Yes, yellow  Treat  MEWS Interventions Considered administering scheduled or prn medications/treatments as ordered  Take Vital Signs  Increase Vital Sign Frequency  Yellow: Q2hr x1, continue Q4hrs until patient remains green for 12hrs  Escalate  MEWS: Escalate Yellow: Discuss with charge nurse and consider notifying provider and/or RRT  Notify: Charge Nurse/RN  Name of Charge Nurse/RN Best boy, RN  Assess: SIRS CRITERIA  SIRS Temperature  0  SIRS Respirations  0  SIRS Pulse 1  SIRS WBC 0  SIRS Score Sum  1

## 2023-11-05 NOTE — Progress Notes (Signed)
 ANTICOAGULATION CONSULT NOTE  Pharmacy Consult for Heparin > Eliquis Indication: atrial fibrillation  Allergies  Allergen Reactions   Penicillins Anaphylaxis   Almond (Diagnostic) Hives   Ceftin [Cefuroxime Axetil] Hives and Other (See Comments)    headaches   Ciprofloxacin Hives and Other (See Comments)    headaches   Darvocet [Propoxyphene N-Acetaminophen] Other (See Comments)    headaches   Lactose Intolerance (Gi) Diarrhea   Levaquin [Levofloxacin In D5w] Hives   Sulfa Antibiotics Hives   Sulfamethoxazole-Trimethoprim Other (See Comments)    Unknown   Zyrtec [Cetirizine Hcl] Other (See Comments)    headache   Other Other (See Comments)    Powder in gloves - Rash  MSG - Hives  Swiss Cheese - Hives       Patient Measurements: Height: 5\' 4"  (162.6 cm) Weight: 84.8 kg (187 lb) IBW/kg (Calculated) : 54.7  Vital Signs: Temp: 98.1 F (36.7 C) (03/18 1119) Temp Source: Temporal (03/18 1119) BP: 103/67 (03/18 1330) Pulse Rate: 95 (03/18 1330)  Labs: Recent Labs    11/04/23 1619 11/04/23 1847 11/05/23 0341 11/05/23 0456  HGB 13.4  --  12.1 12.4  HCT 40.8  --  37.0 37.7  PLT 332  --  312 309  HEPARINUNFRC  --   --   --  0.35  CREATININE 0.84  --   --  0.77  TROPONINIHS 14 15  --   --     Estimated Creatinine Clearance: 61 mL/min (by C-G formula based on SCr of 0.77 mg/dL).   Medical History: Past Medical History:  Diagnosis Date   Anxiety    hx of   Asthma    at times   CHF (congestive heart failure) (HCC)    Depression    hx of   Dyspnea    Giant cell arteritis (HCC)    Gouty arthropathy    Hemolytic anemia (HCC)    Hyperlipidemia    Hypertension    Irregular heart beat    extra beat   Migraines    Polymyalgia (HCC)    Polymyalgia rheumatica (HCC)    Temporal arteritis (HCC)     Assessment: 3 yof with a history of HTN, HF, HLD. Patient is presenting with SOB and dizziness. Noted to be in AF RVR in the ED. Patient is not on  anticoagulation prior to arrival. Heparin per pharmacy consult placed for atrial fibrillation.  TEE with reduced LVEF and LAA thrombus, DCCV not performed.  Consult placed to transition IV heparin to Eliquis.  Goal of Therapy:  Monitor platelets by anticoagulation protocol: Yes   Plan:  STOP IV heparin START Eliquis 5 mg twice daily F/u copay check and education prior to discharge  Trixie Rude, PharmD Clinical Pharmacist 11/05/2023  4:24 PM

## 2023-11-05 NOTE — Progress Notes (Addendum)
   TEE showed decreased EF (40-45%) and left atrial appendage thrombus so DCCV was unable to be performed. She is currently on IV Diltiazem 15 mL/hr and rates ranging in the 110s to 150s (mostly 130s to 140s). Will need to wean off IV Diltiazem as soon as possible given reduced EF. BP soft at 103/67 so unable to add PO Lopressor. Discussed plan with Dr. Izora Ribas. Will load Digoxin. Will give one dose of IV Digoxin 0.25mg  now and then start PO Digoxin 0.125mg  daily tomorrow. I will follow up in about 1.5-2 hours to reassess rate control. If rates are better, we will try to slowly wean off Diltiazem as we add PO Lopressor. However, if rates have not improved, we may have to start IV Amiodarone. This is not ideal given potential for chemical cardioversion and stroke with known left atrial appendage thrombus but we may not have any other options if rates do not improve with the Digoxin. Patient is currently on IV Heparin. Will go ahead and transition to Eliquis 5mg  twice daily. Discussed plan with patient and family.   Corrin Parker, PA-C 11/05/2023 4:12 PM  Update: Micah Flesher back to check on patient. She remains in atrial fibrillation but rates do look better. Mostly in the 90s to 110s. Rates will briefly jump to the 130 at times but comes back down quickly. BP also better. BP while I was in the room was 120/77. She reports feeling tired and reports a little shortness of breath at times but states this is better than when she came in. She would like to hold off on Amiodarone if at all possible given the increased stroke risk with this. The IV Digoxin was only given 1.5 hours ago. Per UpToDate, peak affect is 1 to 6 hours with one study showing a median time to ventricular rate control of 6 hours (range 3 to 15 hours). Given we have already seen some improvement in rates and BP, will add beta-blocker. Will start Lopressor 25mg  every 6 hours (will place hold parameters for BP). Hopefully, we can start weaning  Diltiazem in the morning. Will try to avoid using Amiodarone if at all possible. However, I did explain to patient that if rates remain consistently high, this is likely our only option. She understands this. Will sign patient out to overnight fellow.  Corrin Parker, PA-C 11/05/2023 7:06 PM

## 2023-11-05 NOTE — ED Notes (Signed)
 Pt transported to cath lab by cath lab nurses

## 2023-11-05 NOTE — Progress Notes (Signed)
 ANTICOAGULATION CONSULT NOTE  Pharmacy Consult for Heparin Indication: atrial fibrillation  Allergies  Allergen Reactions   Penicillins Anaphylaxis    Has patient had a PCN reaction causing immediate rash, facial/tongue/throat swelling, SOB or lightheadedness with hypotension: yes Has patient had a PCN reaction causing severe rash involving mucus membranes or skin necrosis: no Has patient had a PCN reaction that required hospitalization: yes Has patient had a PCN reaction occurring within the last 10 years: no If all of the above answers are "NO", then may proceed with Cephalosporin use.    Almond (Diagnostic) Hives   Ceftin [Cefuroxime Axetil] Hives    headaches   Ciprofloxacin Hives    headaches   Darvocet [Propoxyphene N-Acetaminophen]     headaches   Lactose Intolerance (Gi) Diarrhea   Levaquin [Levofloxacin In D5w] Hives   Sulfa Antibiotics Hives   Sulfamethoxazole-Trimethoprim     Other reaction(s): Unknown   Zyrtec [Cetirizine Hcl]     headache   Other     Powder in gloves - Rash  MSG - Hives  Swiss Cheese - Hives       Patient Measurements: Height: 5\' 4"  (162.6 cm) Weight: 84.8 kg (187 lb) IBW/kg (Calculated) : 54.7 Heparin Dosing Weight: 73.3 kg  Vital Signs: Temp: 98.6 F (37 C) (03/18 0050) Temp Source: Oral (03/18 0050) BP: 123/87 (03/18 0300) Pulse Rate: 107 (03/18 0145)  Labs: Recent Labs    11/04/23 1619 11/04/23 1847 11/05/23 0341 11/05/23 0456  HGB 13.4  --  12.1 12.4  HCT 40.8  --  37.0 37.7  PLT 332  --  312 309  HEPARINUNFRC  --   --   --  0.35  CREATININE 0.84  --   --   --   TROPONINIHS 14 15  --   --     Estimated Creatinine Clearance: 58.1 mL/min (by C-G formula based on SCr of 0.84 mg/dL).   Medical History: Past Medical History:  Diagnosis Date   Anxiety    hx of   Asthma    at times   CHF (congestive heart failure) (HCC)    Depression    hx of   Dyspnea    Giant cell arteritis (HCC)    Gouty arthropathy     Hemolytic anemia (HCC)    Hyperlipidemia    Hypertension    Irregular heart beat    extra beat   Migraines    Polymyalgia (HCC)    Polymyalgia rheumatica (HCC)    Temporal arteritis (HCC)     Assessment: 1 yof with a history of HTN, HF, HLD. Patient is presenting with SOB and dizziness. Noted to be in AF RVR in the ED. Patient is not on anticoagulation prior to arrival. Heparin per pharmacy consult placed for atrial fibrillation.  Heparin level 0.35, therapeutic  No issues with infusion or bleeding noted.   Goal of Therapy:  Heparin level 0.3-0.7 units/ml Monitor platelets by anticoagulation protocol: Yes   Plan:  Continue heparin infusion at 1100 units/hr Daily heparin level and CBC  F/u plans for anticoagulation and transition to oral   Thank you for allowing pharmacy to participate in this patient's care.  Marja Kays, PharmD Emergency Medicine Clinical Pharmacist 11/05/2023,5:40 AM

## 2023-11-05 NOTE — Care Management Obs Status (Signed)
 MEDICARE OBSERVATION STATUS NOTIFICATION   Patient Details  Name: Michelle Wilcox MRN: [id number] Date of Birth: 09/22/1945   Medicare Observation Status Notification Given:  Yes    Sherilyn Banker 11/05/2023, 4:15 PM

## 2023-11-05 NOTE — Discharge Instructions (Signed)

## 2023-11-05 NOTE — H&P (View-Only) (Signed)
 Progress Note  Patient Name: Michelle Wilcox Date of Encounter: 11/05/2023 Primary Cardiologist: Bryan Lemma, MD   Subjective   Overnight no evidence of PE. Still have symptomatic tachycardia.  Patient notes that she is very scared about the TEE and DCCV. She is also afraid   Vital Signs    Vitals:   11/05/23 0530 11/05/23 0545 11/05/23 0700 11/05/23 0715  BP: 112/75  104/68 109/61  Pulse: 100  (!) 157 85  Resp: 18  18 17   Temp:  98.6 F (37 C)    TempSrc:  Oral    SpO2: 94%  97% 96%  Weight:      Height:        Intake/Output Summary (Last 24 hours) at 11/05/2023 0754 Last data filed at 11/04/2023 1933 Gross per 24 hour  Intake 1000 ml  Output --  Net 1000 ml   Filed Weights   11/04/23 1625  Weight: 84.8 kg    Physical Exam   GEN: No acute distress.   Neck: No JVD Cardiac: iRRR, soft systolic murmurs, rubs, or gallops. Bouts of tachycardia in the room Respiratory: Clear to auscultation bilaterally. GI: Soft, nontender, non-distended  MS: No edema  Labs  EKG: Pending Telemetry: AF with variable rates   Chemistry Recent Labs  Lab 11/04/23 1619 11/05/23 0456  NA 137 140  K 4.5 3.5  CL 100 105  CO2 28 24  GLUCOSE 116* 96  BUN 17 12  CREATININE 0.84 0.77  CALCIUM 8.8* 8.3*  PROT  --  5.7*  ALBUMIN  --  3.2*  AST  --  39  ALT  --  39  ALKPHOS  --  57  BILITOT  --  0.7  GFRNONAA >60 >60  ANIONGAP 9 11     Hematology Recent Labs  Lab 11/04/23 1619 11/05/23 0341 11/05/23 0456  WBC 11.5* 12.8* 12.8*  RBC 4.06 3.75* 3.79*  HGB 13.4 12.1 12.4  HCT 40.8 37.0 37.7  MCV 100.5* 98.7 99.5  MCH 33.0 32.3 32.7  MCHC 32.8 32.7 32.9  RDW 14.8 14.9 14.9  PLT 332 312 309    Cardiac EnzymesNo results for input(s): "TROPONINI" in the last 168 hours. No results for input(s): "TROPIPOC" in the last 168 hours.   BNP Recent Labs  Lab 11/04/23 1619  BNP 629.3*     DDimer No results for input(s): "DDIMER" in the last 168 hours.    Cardiac Studies   Cardiac Studies & Procedures   ______________________________________________________________________________________________   STRESS TESTS  MYOCARDIAL PERFUSION IMAGING 03/11/2018  Narrative  The left ventricular ejection fraction is hyperdynamic (>65%).  Nuclear stress EF: 69%.  There was no ST segment deviation noted during stress.  The study is normal.  This is a low risk study.  Low risk stress nuclear study with normal perfusion and normal left ventricular regional and global systolic function.   ECHOCARDIOGRAM  ECHOCARDIOGRAM COMPLETE 08/04/2017  Narrative *[place]* *[facility]* [address] [place], [place] [address] [phone]  ------------------------------------------------------------------- Transthoracic Echocardiography  Patient:    Michelle, Wilcox MR #:       [id number] Study Date: 08/04/2017 Gender:     F Age:        71 Height:     162.6 cm Weight:     89.7 kg BSA:        2.05 m^2 Pt. Status: Room:       [address]    Renae Fickle [id number] PERFORMING  [facility], Inpatient SONOGRAPHER  Thurman Coyer ADMITTING    Bobette Mo ORDERING     Bobette Mo REFERRING    Bobette Mo  cc:  ------------------------------------------------------------------- LV EF: 60% -   65%  ------------------------------------------------------------------- Indications:      Dyspnea 786.09.  ------------------------------------------------------------------- History:   Risk factors:  Hypertension. Dyslipidemia.  ------------------------------------------------------------------- Study Conclusions  - Left ventricle: The cavity size was normal. Wall thickness was normal. Systolic function was normal. The estimated ejection fraction was in the range of 60% to 65%. Wall motion was normal; there were no regional wall motion abnormalities. Doppler parameters are consistent  with abnormal left ventricular relaxation (grade 1 diastolic dysfunction). - Aortic valve: There was no stenosis. There was mild regurgitation. - Mitral valve: There was mild regurgitation. - Left atrium: The atrium was mildly dilated. - Right ventricle: The cavity size was normal. Systolic function was normal. - Tricuspid valve: Peak RV-RA gradient (S): 35 mm Hg. - Pulmonary arteries: PA peak pressure: 43 mm Hg (S). - Systemic veins: IVC measured 2.3 cm with > 50% respirophasic variation, suggesting RA pressure 8 mmHg.  Impressions:  - Normal LV size with EF 60-65%. Normal RV size and systolic function. Mild mitral regurgitation. Mild pulmonary hypertension.  ------------------------------------------------------------------- Study data:  No prior study was available for comparison.  Study status:  Routine.  Procedure:  The patient reported no pain pre or post test. Transthoracic echocardiography. Image quality was adequate.  Study completion:  There were no complications. Transthoracic echocardiography.  M-mode, complete 2D, spectral Doppler, and color Doppler.  Birthdate:  Patient birthdate: 08-19-1946.  Age:  Patient is 78 yr old.  Sex:  Gender: female. BMI: 33.9 kg/m^2.  Blood pressure:     119/66  Patient status: Inpatient.  Study date:  Study date: 08/04/2017. Study time: 10:08 AM.  Location:  Bedside.  -------------------------------------------------------------------  ------------------------------------------------------------------- Left ventricle:  The cavity size was normal. Wall thickness was normal. Systolic function was normal. The estimated ejection fraction was in the range of 60% to 65%. Wall motion was normal; there were no regional wall motion abnormalities. Doppler parameters are consistent with abnormal left ventricular relaxation (grade 1 diastolic dysfunction).  ------------------------------------------------------------------- Aortic valve:    Trileaflet.  Doppler:   There was no stenosis. There was mild regurgitation.  ------------------------------------------------------------------- Aorta:  Aortic root: The aortic root was normal in size. Ascending aorta: The ascending aorta was normal in size.  ------------------------------------------------------------------- Mitral valve:   Normal thickness leaflets .  Doppler:   There was no evidence for stenosis.   There was mild regurgitation.    Peak gradient (D): 3 mm Hg.  ------------------------------------------------------------------- Left atrium:  The atrium was mildly dilated.  ------------------------------------------------------------------- Right ventricle:  The cavity size was normal. Systolic function was normal.  ------------------------------------------------------------------- Pulmonic valve:    Structurally normal valve.   Cusp separation was normal.  Doppler:  Transvalvular velocity was within the normal range. There was no regurgitation.  ------------------------------------------------------------------- Tricuspid valve:   Doppler:  There was trivial regurgitation.  ------------------------------------------------------------------- Right atrium:  The atrium was normal in size.  ------------------------------------------------------------------- Pericardium:  There was no pericardial effusion.  ------------------------------------------------------------------- Systemic veins:  IVC measured 2.3 cm with > 50% respirophasic variation, suggesting RA pressure 8 mmHg.  ------------------------------------------------------------------- Measurements  Left ventricle                         Value        Reference LV ID, ED, PLAX chordal  52    mm     43 - 52 LV ID, ES, PLAX chordal                28.5  mm     23 - 38 LV fx shortening, PLAX chordal         45    %      >=29 LV PW thickness, ED                    10.9  mm      ---------- IVS/LV PW ratio, ED                    0.89         <=1.3 Stroke volume, 2D                      79    ml     ---------- Stroke volume/bsa, 2D                  39    ml/m^2 ---------- LV e&', lateral                         9.25  cm/s   ---------- LV E/e&', lateral                       8.65         ---------- LV e&', medial                          7.83  cm/s   ---------- LV E/e&', medial                        10.22        ---------- LV e&', average                         8.54  cm/s   ---------- LV E/e&', average                       9.37         ----------  Ventricular septum                     Value        Reference IVS thickness, ED                      9.68  mm     ----------  LVOT                                   Value        Reference LVOT ID, S                             20    mm     ---------- LVOT area                              3.14  cm^2   ---------- LVOT peak velocity, S  117   cm/s   ---------- LVOT mean velocity, S                  73.2  cm/s   ---------- LVOT VTI, S                            25.1  cm     ---------- LVOT peak gradient, S                  5     mm Hg  ----------  Aorta                                  Value        Reference Aortic root ID, ED                     28    mm     ----------  Left atrium                            Value        Reference LA ID, A-P, ES                         44    mm     ---------- LA ID/bsa, A-P                         2.15  cm/m^2 <=2.2 LA volume, S                           64    ml     ---------- LA volume/bsa, S                       31.2  ml/m^2 ---------- LA volume, ES, 1-p A4C                 84.1  ml     ---------- LA volume/bsa, ES, 1-p A4C             41    ml/m^2 ---------- LA volume, ES, 1-p A2C                 42.1  ml     ---------- LA volume/bsa, ES, 1-p A2C             20.5  ml/m^2 ----------  Mitral valve                           Value        Reference Mitral E-wave  peak velocity            80    cm/s   ---------- Mitral A-wave peak velocity            85.9  cm/s   ---------- Mitral deceleration time               201   ms     150 - 230 Mitral peak gradient, D                3     mm Hg  ---------- Mitral E/A ratio,  peak                 0.9          ----------  Pulmonary arteries                     Value        Reference PA pressure, S, DP             (H)     43    mm Hg  <=30  Tricuspid valve                        Value        Reference Tricuspid regurg peak velocity         294   cm/s   ---------- Tricuspid peak RV-RA gradient          35    mm Hg  ----------  Right atrium                           Value        Reference RA ID, S-I, ES, A4C            (H)     52.5  mm     34 - 49 RA area, ES, A4C                       12.9  cm^2   8.3 - 19.5 RA volume, ES, A/L                     26.6  ml     ---------- RA volume/bsa, ES, A/L                 13    ml/m^2 ----------  Systemic veins                         Value        Reference Estimated CVP                          8     mm Hg  ----------  Right ventricle                        Value        Reference TAPSE                                  21.2  mm     ---------- RV s&', lateral, S                      16.6  cm/s   ----------  Legend: (L)  and  (H)  mark values outside specified reference range.  ------------------------------------------------------------------- Prepared and Electronically Authenticated by  Marca Ancona, M.D. 2018-12-16T13:15:25          ______________________________________________________________________________________________      Assessment & Plan   PAF - on heparin - Unable to control rates on max dose diltiazem - consented for TEE/DCCV yesterday, on schedule an NPO - reviewed all of her concenrns again today- she is willing to proceed  Chronic diastolic dysfunction Pleural effusion - noted on CTPE - likely related to  AF, will return home  diuretic (40 mg PO; ordering daily she takes normal PRN 20 mg)  Lekocytosis - as per [facility] teammates  IF Successful DCCV with no LV Dysfunction - DC on eliquis 5 mg PO BID, no las PRN, home metoprolol and arrange cardiology follow up with Dr. Elissa Hefty Team (OK to DC from cardiac standpoint; she notes she has had many falls and advocates for 11/06/23 DC)  IF Successful DCCV with LV dysfunction - change heparin to eliquis, start PM Metoprolol and plan to keep 11/06/23 for GDMT titration (low dose MRA or SGLT2i and succinate start)     For questions or updates, please contact [facility] Please consult [email or web address] for contact info under Cardiology/STEMI.      Riley Lam, MD FASE [facility] Cardiologist [facility]  [address] [place], [address] [place], [place] [address] [phone]  7:54 AM

## 2023-11-05 NOTE — ED Notes (Signed)
 Pt rang out for help with blankets being placed on her

## 2023-11-05 NOTE — Progress Notes (Signed)
 [facility] PROGRESS NOTE   Michelle Wilcox RUE:[id number] DOB: [birth year] DOA: 11/04/2023  PCP: Gweneth Dimitri, MD  Brief History:  78 y.o. female with history of polymyalgia rheumatica, temporal arteritis, autoimmune hemolytic anemia status post splenectomy, diastolic dysfunction, hypertension has been feeling weak tired and fatigued and occasional fluttering sensation in her chest over the last 2 weeks since she got cortisone shot for her right knee.  Due to the symptoms patient presented to her primary care physician and at primary care physician's office patient was found to be tachycardic and in A-fib and was referred to the ER.  She was subsequently hospitalized for further management.  Consultants: Cardiology  Procedures: TEE and DC cardioversion is planned for today    Subjective/Interval History: Patient denies any chest pain.  Feels anxious.  No nausea vomiting.  Denies any shortness of breath.  Lives with her husband.    Assessment/Plan:  Atrial fibrillation with RVR This is new onset.  With a CHADS2 vascular score of at least 3.  Patient started on Cardizem infusion along with heparin.  Remains in atrial fibrillation this morning. Seen by cardiology.  Plan is for TEE followed by DCCV later today. Will be transitioned to oral anticoagulants either later today or tomorrow. Supplement potassium and magnesium.  History of diastolic dysfunction Follow-up on echocardiogram.  Takes 20 mg of furosemide daily.  Currently on 40 mg of furosemide daily. Small left-sided pleural effusion noted on CT angiogram. Atelectasis versus scarring also noted.  Patient is not symptomatic from a respiratory standpoint.  Abnormal thyroid function test TSH was normal but free T4 was noted to be mildly elevated at 1.17.  Likely sick euthyroid.  No changes to management.  Will recommend thyroid function test be rechecked in 3 to 4 weeks.  History of polymyalgia  rheumatica/temporal arteritis/autoimmune hemolytic anemia She is status post splenectomy. She is on chronic prednisone 5 mg daily.  Has been ordered 1 dose of Solu-Medrol and then to reinitiate prednisone from tomorrow morning.  Stable at this time.  Essential hypertension Takes furosemide and metoprolol prior to admission.  Currently on a Cardizem infusion.  Leukocytosis Could be secondary to prednisone as well as cortisone injections that she received to her knee recently.  She is afebrile.  No source of infection identified.  Obesity Estimated body mass index is 32.1 kg/m as calculated from the following:   Height as of this encounter: 5\' 4"  (1.626 m).   Weight as of this encounter: 84.8 kg.   DVT Prophylaxis: On IV heparin Code Status: Full code Family Communication: Discussed with patient Disposition Plan: Hopefully return home when improved  Status is: Observation The patient will require care spanning > 2 midnights and should be moved to inpatient because: Atrial fibrillation with RVR      Medications: Scheduled:  furosemide  40 mg Oral Daily   [START ON 11/06/2023] predniSONE  5 mg Oral Q breakfast   Continuous:  diltiazem (CARDIZEM) infusion 15 mg/hr (11/05/23 0330)   heparin 1,100 Units/hr (11/05/23 0520)   magnesium sulfate bolus IVPB     potassium chloride 10 mEq (11/05/23 0835)   PRN:  Antibiotics: Anti-infectives (From admission, onward)    None       Objective:  Vital Signs  Vitals:   11/05/23 0745 11/05/23 0800 11/05/23 0815 11/05/23 0858  BP: (!) 103/56 91/60 117/83   Pulse: 90 99 99   Resp: 17 17 (!) 23   Temp:    98.8 F (37.1 C)  TempSrc:    Oral  SpO2: 93% 95% 99%   Weight:      Height:        Intake/Output Summary (Last 24 hours) at 11/05/2023 0902 Last data filed at 11/05/2023 0833 Gross per 24 hour  Intake 1100 ml  Output --  Net 1100 ml   Filed Weights   11/04/23 1625  Weight: 84.8 kg    General appearance: Awake  alert.  In no distress Resp: Clear to auscultation bilaterally.  Normal effort Cardio: S1-S2 is tachycardic and irregularly irregular. GI: Abdomen is soft.  Nontender nondistended.  Bowel sounds are present normal.  No masses organomegaly Extremities: Mild pedal edema bilateral lower extremities Neurologic: Alert and oriented x3.  No focal neurological deficits.    Lab Results:  Data Reviewed: I have personally reviewed following labs and reports of the imaging studies  CBC: Recent Labs  Lab 11/04/23 1619 11/05/23 0341 11/05/23 0456  WBC 11.5* 12.8* 12.8*  NEUTROABS  --   --  8.6*  HGB 13.4 12.1 12.4  HCT 40.8 37.0 37.7  MCV 100.5* 98.7 99.5  PLT 332 312 309    Basic Metabolic Panel: Recent Labs  Lab 11/04/23 1619 11/05/23 0456  NA 137 140  K 4.5 3.5  CL 100 105  CO2 28 24  GLUCOSE 116* 96  BUN 17 12  CREATININE 0.84 0.77  CALCIUM 8.8* 8.3*  MG  --  1.9    GFR: Estimated Creatinine Clearance: 61 mL/min (by C-G formula based on SCr of 0.77 mg/dL).  Liver Function Tests: Recent Labs  Lab 11/05/23 0456  AST 39  ALT 39  ALKPHOS 57  BILITOT 0.7  PROT 5.7*  ALBUMIN 3.2*     Thyroid Function Tests: Recent Labs    11/04/23 1619  TSH 1.266  FREET4 1.17*    Anemia Panel: Recent Labs    11/05/23 0456  VITAMINB12 602  FOLATE >40.0    Radiology Studies: CT Angio Chest Pulmonary Embolism (PE) W or WO Contrast Result Date: 11/04/2023 CLINICAL DATA:  Suspected pulmonary embolism. EXAM: CT ANGIOGRAPHY CHEST WITH CONTRAST TECHNIQUE: Multidetector CT imaging of the chest was performed using the standard protocol during bolus administration of intravenous contrast. Multiplanar CT image reconstructions and MIPs were obtained to evaluate the vascular anatomy. RADIATION DOSE REDUCTION: This exam was performed according to the departmental dose-optimization program which includes automated exposure control, adjustment of the mA and/or kV according to patient size  and/or use of iterative reconstruction technique. CONTRAST:  75mL OMNIPAQUE IOHEXOL 350 MG/ML SOLN COMPARISON:  None Available. FINDINGS: Cardiovascular: There is moderate severity calcification of the aortic arch, without evidence of aortic aneurysm. Satisfactory opacification of the pulmonary arteries to the segmental level. No evidence of pulmonary embolism. There is mild cardiomegaly. No pericardial effusion. Mediastinum/Nodes: Mild pretracheal lymphadenopathy is seen. Thyroid gland, trachea, and esophagus demonstrate no significant findings. Lungs/Pleura: Mild lingular, posterolateral right middle lobe and posterior bilateral lower lobe scarring and/or atelectasis is seen, left greater than right. There is a small left pleural effusion. No pneumothorax is identified. Upper Abdomen: Noninflamed diverticula are seen throughout the splenic flexure. Musculoskeletal: No chest wall abnormality. No acute or significant osseous findings. Review of the MIP images confirms the above findings. IMPRESSION: 1. No evidence of pulmonary embolism. 2. Mild lingular, right middle lobe and posterior bilateral lower lobe scarring and/or atelectasis, left greater than right. 3. Small left pleural effusion. 4. Colonic diverticulosis. 5. Aortic atherosclerosis. Aortic Atherosclerosis (ICD10-I70.0). Electronically Signed   By: Waylan Rocher  [place] M.D.   On: 11/04/2023 21:23   DG Chest Port 1 View Result Date: 11/04/2023 CLINICAL DATA:  Atrial fibrillation EXAM: PORTABLE CHEST 1 VIEW COMPARISON:  02/13/2023 FINDINGS: Mild cardiomegaly. No confluent airspace opacities, effusions or edema. Minimal linear densities in the lung bases, favor atelectasis. No acute bony abnormality. IMPRESSION: Cardiomegaly.  No overt edema. Minimal bibasilar atelectasis. Electronically Signed   By: Charlett Nose M.D.   On: 11/04/2023 17:23       LOS: 0 days   Michelle Wilcox Michelle Wilcox  [facility] Pager on [email or web address]  11/05/2023, 9:02 AM

## 2023-11-05 NOTE — CV Procedure (Signed)
    TRANSESOPHAGEAL ECHOCARDIOGRAM   NAME:  Michelle Wilcox    MRN: [id number] DOB:  10-Aug-1946    ADMIT DATE: 11/04/2023  INDICATIONS: Atrial fibrillation   PROCEDURE:   Informed consent was obtained prior to the procedure. The risks, benefits and alternatives for the procedure were discussed and the patient comprehended these risks.  Risks include, but are not limited to, cough, sore throat, vomiting, nausea, somnolence, esophageal and stomach trauma or perforation, bleeding, low blood pressure, aspiration, pneumonia, infection, trauma to the teeth and death.    Procedural time out performed. The oropharynx was anesthetized with viscous lidocaine.  Anesthesia was administered by the anaesthesilogy teamto achieve and maintain moderate to deep conscious sedation.  The patient's heart rate, blood pressure, and oxygen saturation were monitored continuously during the procedure.  The transesophageal probe was inserted in the esophagus and stomach without difficulty and multiple views were obtained.   The patient tolerated the procedure well.  COMPLICATIONS:    There were no immediate complications.  KEY FINDINGS:  Depressed ejection fraction, left atrial appendage clot seen. Full report to follow. Further management per primary team.   Thomasene Ripple, DO [facility]  [facility]  1:00 PM

## 2023-11-05 NOTE — Transfer of Care (Signed)
 Immediate Anesthesia Transfer of Care Note  Patient: Michelle Wilcox  Procedure(s) Performed: TRANSESOPHAGEAL ECHOCARDIOGRAM CARDIOVERSION  Patient Location: PACU and Cath Lab  Anesthesia Type:MAC  Level of Consciousness: drowsy, patient cooperative, and responds to stimulation  Airway & Oxygen Therapy: Patient Spontanous Breathing and Patient connected to nasal cannula oxygen  Post-op Assessment: Report given to RN and Post -op Vital signs reviewed and stable  Post vital signs: Reviewed and stable  Last Vitals:  Vitals Value Taken Time  BP 114/64 11/05/23 1255  Temp    Pulse 111 11/05/23 1258  Resp 24 11/05/23 1258  SpO2 91 % 11/05/23 1258  Vitals shown include unfiled device data.  Last Pain:  Vitals:   11/05/23 1143  TempSrc:   PainSc: 0-No pain         Complications: No notable events documented.

## 2023-11-05 NOTE — Anesthesia Preprocedure Evaluation (Signed)
 Anesthesia Evaluation  Patient identified by MRN, date of birth, ID band Patient awake    Reviewed: Allergy & Precautions, NPO status , Patient's Chart, lab work & pertinent test results  History of Anesthesia Complications Negative for: history of anesthetic complications  Airway Mallampati: III  TM Distance: >3 FB Neck ROM: Full    Dental  (+) Dental Advisory Given, Teeth Intact   Pulmonary shortness of breath, asthma    breath sounds clear to auscultation       Cardiovascular hypertension, +CHF and + DOE  + dysrhythmias Atrial Fibrillation  Rhythm:Irregular Rate:Tachycardia     Neuro/Psych  Headaches PSYCHIATRIC DISORDERS Anxiety Depression     Neuromuscular disease    GI/Hepatic negative GI ROS, Neg liver ROS,,,  Endo/Other  negative endocrine ROS    Renal/GU negative Renal ROSLab Results      Component                Value               Date                      NA                       140                 11/05/2023                K                        3.5                 11/05/2023                CO2                      24                  11/05/2023                GLUCOSE                  96                  11/05/2023                BUN                      12                  11/05/2023                CREATININE               0.77                11/05/2023                CALCIUM                  8.3 (L)             11/05/2023                EGFR                     50 (L)  08/12/2017                GFRNONAA                 >60                 11/05/2023                Musculoskeletal   Abdominal   Peds  Hematology  (+) Blood dyscrasia, anemia Lab Results      Component                Value               Date                      WBC                      12.8 (H)            11/05/2023                HGB                      12.4                11/05/2023                HCT                       37.7                11/05/2023                MCV                      99.5                11/05/2023                PLT                      309                 11/05/2023              Anesthesia Other Findings   Reproductive/Obstetrics                             Anesthesia Physical Anesthesia Plan  ASA: 3  Anesthesia Plan: MAC   Post-op Pain Management: Minimal or no pain anticipated   Induction: Intravenous  PONV Risk Score and Plan: 2 and Propofol infusion and Treatment may vary due to age or medical condition  Airway Management Planned: Nasal Cannula, Natural Airway and Simple Face Mask  Additional Equipment: None  Intra-op Plan:   Post-operative Plan:   Informed Consent: I have reviewed the patients History and Physical, chart, labs and discussed the procedure including the risks, benefits and alternatives for the proposed anesthesia with the patient or authorized representative who has indicated his/her understanding and acceptance.     Dental advisory given  Plan Discussed with: CRNA  Anesthesia Plan Comments:        Anesthesia Quick Evaluation

## 2023-11-05 NOTE — Interval H&P Note (Signed)
 History and Physical Interval Note:  11/05/2023 12:27 PM  Michelle Wilcox  has presented today for surgery, with the diagnosis of afib.  The various methods of treatment have been discussed with the patient and family. After consideration of risks, benefits and other options for treatment, the patient has consented to  Procedure(s): TRANSESOPHAGEAL ECHOCARDIOGRAM (N/A) CARDIOVERSION (N/A) as a surgical intervention.  The patient's history has been reviewed, patient examined, no change in status, stable for surgery.  I have reviewed the patient's chart and labs.  Questions were answered to the patient's satisfaction.     [name]

## 2023-11-05 NOTE — Progress Notes (Signed)
 Progress Note  Patient Name: Michelle Wilcox Date of Encounter: 11/05/2023 Primary Cardiologist: Bryan Lemma, MD   Subjective   Overnight no evidence of PE. Still have symptomatic tachycardia.  Patient notes that she is very scared about the TEE and DCCV. She is also afraid   Vital Signs    Vitals:   11/05/23 0530 11/05/23 0545 11/05/23 0700 11/05/23 0715  BP: 112/75  104/68 109/61  Pulse: 100  (!) 157 85  Resp: 18  18 17   Temp:  98.6 F (37 C)    TempSrc:  Oral    SpO2: 94%  97% 96%  Weight:      Height:        Intake/Output Summary (Last 24 hours) at 11/05/2023 0754 Last data filed at 11/04/2023 1933 Gross per 24 hour  Intake 1000 ml  Output --  Net 1000 ml   Filed Weights   11/04/23 1625  Weight: 84.8 kg    Physical Exam   GEN: No acute distress.   Neck: No JVD Cardiac: iRRR, soft systolic murmurs, rubs, or gallops. Bouts of tachycardia in the room Respiratory: Clear to auscultation bilaterally. GI: Soft, nontender, non-distended  MS: No edema  Labs  EKG: Pending Telemetry: AF with variable rates   Chemistry Recent Labs  Lab 11/04/23 1619 11/05/23 0456  NA 137 140  K 4.5 3.5  CL 100 105  CO2 28 24  GLUCOSE 116* 96  BUN 17 12  CREATININE 0.84 0.77  CALCIUM 8.8* 8.3*  PROT  --  5.7*  ALBUMIN  --  3.2*  AST  --  39  ALT  --  39  ALKPHOS  --  57  BILITOT  --  0.7  GFRNONAA >60 >60  ANIONGAP 9 11     Hematology Recent Labs  Lab 11/04/23 1619 11/05/23 0341 11/05/23 0456  WBC 11.5* 12.8* 12.8*  RBC 4.06 3.75* 3.79*  HGB 13.4 12.1 12.4  HCT 40.8 37.0 37.7  MCV 100.5* 98.7 99.5  MCH 33.0 32.3 32.7  MCHC 32.8 32.7 32.9  RDW 14.8 14.9 14.9  PLT 332 312 309    Cardiac EnzymesNo results for input(s): "TROPONINI" in the last 168 hours. No results for input(s): "TROPIPOC" in the last 168 hours.   BNP Recent Labs  Lab 11/04/23 1619  BNP 629.3*     DDimer No results for input(s): "DDIMER" in the last 168 hours.    Cardiac Studies   Cardiac Studies & Procedures   ______________________________________________________________________________________________   STRESS TESTS  MYOCARDIAL PERFUSION IMAGING 03/11/2018  Narrative  The left ventricular ejection fraction is hyperdynamic (>65%).  Nuclear stress EF: 69%.  There was no ST segment deviation noted during stress.  The study is normal.  This is a low risk study.  Low risk stress nuclear study with normal perfusion and normal left ventricular regional and global systolic function.   ECHOCARDIOGRAM  ECHOCARDIOGRAM COMPLETE 08/04/2017  Narrative *[place]* *[facility]* [address] [place], [place] [address] [phone]  ------------------------------------------------------------------- Transthoracic Echocardiography  Patient:    [name], [name] MR #:       [id number] Study Date: 08/04/2017 Gender:     F Age:        71 Height:     162.6 cm Weight:     89.7 kg BSA:        2.05 m^2 Pt. Status: Room:       [address]    Renae Fickle [id number] PERFORMING  [facility], Inpatient SONOGRAPHER  Thurman Coyer ADMITTING    Bobette Mo ORDERING     Bobette Mo REFERRING    Bobette Mo  cc:  ------------------------------------------------------------------- LV EF: 60% -   65%  ------------------------------------------------------------------- Indications:      Dyspnea 786.09.  ------------------------------------------------------------------- History:   Risk factors:  Hypertension. Dyslipidemia.  ------------------------------------------------------------------- Study Conclusions  - Left ventricle: The cavity size was normal. Wall thickness was normal. Systolic function was normal. The estimated ejection fraction was in the range of 60% to 65%. Wall motion was normal; there were no regional wall motion abnormalities. Doppler parameters are consistent  with abnormal left ventricular relaxation (grade 1 diastolic dysfunction). - Aortic valve: There was no stenosis. There was mild regurgitation. - Mitral valve: There was mild regurgitation. - Left atrium: The atrium was mildly dilated. - Right ventricle: The cavity size was normal. Systolic function was normal. - Tricuspid valve: Peak RV-RA gradient (S): 35 mm Hg. - Pulmonary arteries: PA peak pressure: 43 mm Hg (S). - Systemic veins: IVC measured 2.3 cm with > 50% respirophasic variation, suggesting RA pressure 8 mmHg.  Impressions:  - Normal LV size with EF 60-65%. Normal RV size and systolic function. Mild mitral regurgitation. Mild pulmonary hypertension.  ------------------------------------------------------------------- Study data:  No prior study was available for comparison.  Study status:  Routine.  Procedure:  The patient reported no pain pre or post test. Transthoracic echocardiography. Image quality was adequate.  Study completion:  There were no complications. Transthoracic echocardiography.  M-mode, complete 2D, spectral Doppler, and color Doppler.  Birthdate:  Patient birthdate: 08-19-1946.  Age:  Patient is 78 yr old.  Sex:  Gender: female. BMI: 33.9 kg/m^2.  Blood pressure:     119/66  Patient status: Inpatient.  Study date:  Study date: 08/04/2017. Study time: 10:08 AM.  Location:  Bedside.  -------------------------------------------------------------------  ------------------------------------------------------------------- Left ventricle:  The cavity size was normal. Wall thickness was normal. Systolic function was normal. The estimated ejection fraction was in the range of 60% to 65%. Wall motion was normal; there were no regional wall motion abnormalities. Doppler parameters are consistent with abnormal left ventricular relaxation (grade 1 diastolic dysfunction).  ------------------------------------------------------------------- Aortic valve:    Trileaflet.  Doppler:   There was no stenosis. There was mild regurgitation.  ------------------------------------------------------------------- Aorta:  Aortic root: The aortic root was normal in size. Ascending aorta: The ascending aorta was normal in size.  ------------------------------------------------------------------- Mitral valve:   Normal thickness leaflets .  Doppler:   There was no evidence for stenosis.   There was mild regurgitation.    Peak gradient (D): 3 mm Hg.  ------------------------------------------------------------------- Left atrium:  The atrium was mildly dilated.  ------------------------------------------------------------------- Right ventricle:  The cavity size was normal. Systolic function was normal.  ------------------------------------------------------------------- Pulmonic valve:    Structurally normal valve.   Cusp separation was normal.  Doppler:  Transvalvular velocity was within the normal range. There was no regurgitation.  ------------------------------------------------------------------- Tricuspid valve:   Doppler:  There was trivial regurgitation.  ------------------------------------------------------------------- Right atrium:  The atrium was normal in size.  ------------------------------------------------------------------- Pericardium:  There was no pericardial effusion.  ------------------------------------------------------------------- Systemic veins:  IVC measured 2.3 cm with > 50% respirophasic variation, suggesting RA pressure 8 mmHg.  ------------------------------------------------------------------- Measurements  Left ventricle                         Value        Reference LV ID, ED, PLAX chordal  52    mm     43 - 52 LV ID, ES, PLAX chordal                28.5  mm     23 - 38 LV fx shortening, PLAX chordal         45    %      >=29 LV PW thickness, ED                    10.9  mm      ---------- IVS/LV PW ratio, ED                    0.89         <=1.3 Stroke volume, 2D                      79    ml     ---------- Stroke volume/bsa, 2D                  39    ml/m^2 ---------- LV e&', lateral                         9.25  cm/s   ---------- LV E/e&', lateral                       8.65         ---------- LV e&', medial                          7.83  cm/s   ---------- LV E/e&', medial                        10.22        ---------- LV e&', average                         8.54  cm/s   ---------- LV E/e&', average                       9.37         ----------  Ventricular septum                     Value        Reference IVS thickness, ED                      9.68  mm     ----------  LVOT                                   Value        Reference LVOT ID, S                             20    mm     ---------- LVOT area                              3.14  cm^2   ---------- LVOT peak velocity, S  117   cm/s   ---------- LVOT mean velocity, S                  73.2  cm/s   ---------- LVOT VTI, S                            25.1  cm     ---------- LVOT peak gradient, S                  5     mm Hg  ----------  Aorta                                  Value        Reference Aortic root ID, ED                     28    mm     ----------  Left atrium                            Value        Reference LA ID, A-P, ES                         44    mm     ---------- LA ID/bsa, A-P                         2.15  cm/m^2 <=2.2 LA volume, S                           64    ml     ---------- LA volume/bsa, S                       31.2  ml/m^2 ---------- LA volume, ES, 1-p A4C                 84.1  ml     ---------- LA volume/bsa, ES, 1-p A4C             41    ml/m^2 ---------- LA volume, ES, 1-p A2C                 42.1  ml     ---------- LA volume/bsa, ES, 1-p A2C             20.5  ml/m^2 ----------  Mitral valve                           Value        Reference Mitral E-wave  peak velocity            80    cm/s   ---------- Mitral A-wave peak velocity            85.9  cm/s   ---------- Mitral deceleration time               201   ms     150 - 230 Mitral peak gradient, D                3     mm Hg  ---------- Mitral E/A ratio,  peak                 0.9          ----------  Pulmonary arteries                     Value        Reference PA pressure, S, DP             (H)     43    mm Hg  <=30  Tricuspid valve                        Value        Reference Tricuspid regurg peak velocity         294   cm/s   ---------- Tricuspid peak RV-RA gradient          35    mm Hg  ----------  Right atrium                           Value        Reference RA ID, S-I, ES, A4C            (H)     52.5  mm     34 - 49 RA area, ES, A4C                       12.9  cm^2   8.3 - 19.5 RA volume, ES, A/L                     26.6  ml     ---------- RA volume/bsa, ES, A/L                 13    ml/m^2 ----------  Systemic veins                         Value        Reference Estimated CVP                          8     mm Hg  ----------  Right ventricle                        Value        Reference TAPSE                                  21.2  mm     ---------- RV s&', lateral, S                      16.6  cm/s   ----------  Legend: (L)  and  (H)  mark values outside specified reference range.  ------------------------------------------------------------------- Prepared and Electronically Authenticated by  Marca Ancona, M.D. 2018-12-16T13:15:25          ______________________________________________________________________________________________      Assessment & Plan   PAF - on heparin - Unable to control rates on max dose diltiazem - consented for TEE/DCCV yesterday, on schedule an NPO - reviewed all of her concenrns again today- she is willing to proceed  Chronic diastolic dysfunction Pleural effusion - noted on CTPE - likely related to  AF, will return home  diuretic (40 mg PO; ordering daily she takes normal PRN 20 mg)  Lekocytosis - as per [facility] teammates  IF Successful DCCV with no LV Dysfunction - DC on eliquis 5 mg PO BID, no las PRN, home metoprolol and arrange cardiology follow up with Dr. Elissa Hefty Team (OK to DC from cardiac standpoint; she notes she has had many falls and advocates for 11/06/23 DC)  IF Successful DCCV with LV dysfunction - change heparin to eliquis, start PM Metoprolol and plan to keep 11/06/23 for GDMT titration (low dose MRA or SGLT2i and succinate start)     For questions or updates, please contact [facility] Please consult [email or web address] for contact info under Cardiology/STEMI.      Riley Lam, MD FASE [facility] Cardiologist [facility]  [address] [place], [address] [place], [place] [address] [phone]  7:54 AM

## 2023-11-06 ENCOUNTER — Other Ambulatory Visit (HOSPITAL_COMMUNITY): Payer: Self-pay

## 2023-11-06 ENCOUNTER — Telehealth (HOSPITAL_COMMUNITY): Payer: Self-pay | Admitting: Pharmacy Technician

## 2023-11-06 DIAGNOSIS — I4891 Unspecified atrial fibrillation: Secondary | ICD-10-CM | POA: Diagnosis not present

## 2023-11-06 DIAGNOSIS — I480 Paroxysmal atrial fibrillation: Secondary | ICD-10-CM

## 2023-11-06 DIAGNOSIS — I5020 Unspecified systolic (congestive) heart failure: Secondary | ICD-10-CM

## 2023-11-06 LAB — COMPREHENSIVE METABOLIC PANEL
ALT: 28 U/L (ref 0–44)
AST: 29 U/L (ref 15–41)
Albumin: 3 g/dL — ABNORMAL LOW (ref 3.5–5.0)
Alkaline Phosphatase: 50 U/L (ref 38–126)
Anion gap: 7 (ref 5–15)
BUN: 9 mg/dL (ref 8–23)
CO2: 25 mmol/L (ref 22–32)
Calcium: 7.8 mg/dL — ABNORMAL LOW (ref 8.9–10.3)
Chloride: 107 mmol/L (ref 98–111)
Creatinine, Ser: 0.77 mg/dL (ref 0.44–1.00)
GFR, Estimated: >60 mL/min (ref >60)
Glucose, Bld: 128 mg/dL — ABNORMAL HIGH (ref 70–99)
Potassium: 4.2 mmol/L (ref 3.5–5.1)
Sodium: 139 mmol/L (ref 135–145)
Total Bilirubin: 0.4 mg/dL (ref 0.0–1.2)
Total Protein: 5.4 g/dL — ABNORMAL LOW (ref 6.5–8.1)

## 2023-11-06 LAB — CBC
HCT: 36.1 % (ref 36.0–46.0)
Hemoglobin: 11.7 g/dL — ABNORMAL LOW (ref 12.0–15.0)
MCH: 32.2 pg (ref 26.0–34.0)
MCHC: 32.4 g/dL (ref 30.0–36.0)
MCV: 99.4 fL (ref 80.0–100.0)
Platelets: 289 10*3/uL (ref 150–400)
RBC: 3.63 MIL/uL — ABNORMAL LOW (ref 3.87–5.11)
RDW: 14.9 % (ref 11.5–15.5)
WBC: 13.1 10*3/uL — ABNORMAL HIGH (ref 4.0–10.5)
nRBC: 0 % (ref 0.0–0.2)

## 2023-11-06 LAB — MAGNESIUM: Magnesium: 2 mg/dL (ref 1.7–2.4)

## 2023-11-06 MED ORDER — METOPROLOL TARTRATE 25 MG PO TABS
25.0000 mg | ORAL_TABLET | Freq: Four times a day (QID) | ORAL | Status: AC
  Administered 2023-11-06: 25 mg via ORAL
  Filled 2023-11-06: qty 1

## 2023-11-06 MED ORDER — METOPROLOL SUCCINATE ER 50 MG PO TB24
50.0000 mg | ORAL_TABLET | Freq: Two times a day (BID) | ORAL | Status: DC
  Administered 2023-11-06 – 2023-11-11 (×10): 50 mg via ORAL
  Filled 2023-11-06 (×10): qty 1

## 2023-11-06 NOTE — Progress Notes (Addendum)
 Rounding Note    Patient Name: Michelle Wilcox Date of Encounter: 11/06/2023  [facility] Cardiologist: Bryan Lemma, MD   Subjective   She remains in atrial fibrillation although rates are better today. Rate improved to the 60s-80s overnight. Currently in the 90s to 110s but will quickly increase to the 130s with minimal activity. IV Diltiazem was stopped overnight due to low BP. She slept okay last night but states she does not feel good this morning. She feels more short of breath this morning and feels very tired. She also feels more of the flutter sensation in her chest this morning but no chest pain. She also seems more anxious this morning.  Inpatient Medications    Scheduled Meds:  apixaban  5 mg Oral BID   digoxin  0.125 mg Oral Daily   furosemide  40 mg Oral Daily   loratadine  10 mg Oral Daily   metoprolol tartrate  25 mg Oral Q6H   predniSONE  5 mg Oral Q breakfast   rosuvastatin  5 mg Oral Q48H   Continuous Infusions:  diltiazem (CARDIZEM) infusion Stopped (11/06/23 0233)   magnesium sulfate bolus IVPB     PRN Meds: mouth rinse   Vital Signs    Vitals:   11/06/23 0233 11/06/23 0253 11/06/23 0428 11/06/23 0727  BP: (!) 94/58 (!) 94/58  (!) 140/84  Pulse: (!) 59 (!) 59  97  Resp: 15   17  Temp:   98.2 F (36.8 C) 98.7 F (37.1 C)  TempSrc:   Oral Oral  SpO2: 95%   95%  Weight:      Height:        Intake/Output Summary (Last 24 hours) at 11/06/2023 0731 Last data filed at 11/06/2023 0728 Gross per 24 hour  Intake 1187.38 ml  Output 500 ml  Net 687.38 ml      11/04/2023    4:25 PM 05/20/2023    9:28 AM 04/09/2023    1:37 PM  Last 3 Weights  Weight (lbs) 187 lb 182 lb 12.8 oz 184 lb 6.4 oz  Weight (kg) 84.823 kg 82.918 kg 83.643 kg      Telemetry    Atrial fibrillation with rates in the 60s to 80s overnight. Currently in the 90s to 110s but will quickly increase to the 130s with minimal activity. Occasional short pauses noted  overnight (longest pause 2.24 seconds). One 6 beat run of NSVT noted. - Personally Reviewed  ECG    No new ECG tracing today. - Personally Reviewed  Physical Exam   GEN: Obese Caucasian female with no acute distress.   Neck: No JVD. Cardiac: Mild tachycardia with irregularly irregular rhythm. No murmurs, rubs, or gallops.  Respiratory: No increased work of breathing. Very minimal crackles noted in bilateral bases (suspect atelectasis). Otherwise, clear to auscultation bilaterally. GI: Soft, non-distended, and non-tender. MS: No lower extremity edema. No deformity. Skin: Warm and dry. Neuro:  No focal deficits. Psych: Normal affect. Responds appropriately.  Labs    High Sensitivity Troponin:   Recent Labs  Lab 11/04/23 1619 11/04/23 1847  TROPONINIHS 14 15     Chemistry Recent Labs  Lab 11/04/23 1619 11/05/23 0456 11/06/23 0353  NA 137 140 139  K 4.5 3.5 4.2  CL 100 105 107  CO2 28 24 25   GLUCOSE 116* 96 128*  BUN 17 12 9   CREATININE 0.84 0.77 0.77  CALCIUM 8.8* 8.3* 7.8*  MG  --  1.9 2.0  PROT  --  5.7* 5.4*  ALBUMIN  --  3.2* 3.0*  AST  --  39 29  ALT  --  39 28  ALKPHOS  --  57 50  BILITOT  --  0.7 0.4  GFRNONAA >60 >60 >60  ANIONGAP 9 11 7     Lipids No results for input(s): "CHOL", "TRIG", "HDL", "LABVLDL", "LDLCALC", "CHOLHDL" in the last 168 hours.  Hematology Recent Labs  Lab 11/05/23 0341 11/05/23 0456 11/06/23 0353  WBC 12.8* 12.8* 13.1*  RBC 3.75* 3.79* 3.63*  HGB 12.1 12.4 11.7*  HCT 37.0 37.7 36.1  MCV 98.7 99.5 99.4  MCH 32.3 32.7 32.2  MCHC 32.7 32.9 32.4  RDW 14.9 14.9 14.9  PLT 312 309 289   Thyroid  Recent Labs  Lab 11/04/23 1619  TSH 1.266  FREET4 1.17*    BNP Recent Labs  Lab 11/04/23 1619  BNP 629.3*    DDimer No results for input(s): "DDIMER" in the last 168 hours.   Radiology    ECHO TEE Result Date: 11/05/2023    TRANSESOPHOGEAL ECHO REPORT   Patient Name:   Michelle Wilcox Date of Exam: 11/05/2023  Medical Rec #:  [id number]               Height:       64.0 in Accession #:    [id number]              Weight:       187.0 lb Date of Birth:  September 02, 1945               BSA:          1.901 m Patient Age:    77 years                BP:           141/110 mmHg Patient Gender: F                       HR:           126 bpm. Exam Location:  Inpatient Procedure: Cardiac Doppler, Color Doppler and Transesophageal Echo (Both            Spectral and Color Flow Doppler were utilized during procedure). Indications:     Cardioversion  History:         Patient has no prior history of Echocardiogram examinations.  Sonographer:     Harriette Bouillon RDCS Referring Phys:  [id number] CHRISTOPHER RONALD BERGE Diagnosing Phys: Thomasene Ripple DO PROCEDURE: The transesophogeal probe was passed without difficulty through the esophogus of the patient. Sedation performed by different physician. The patient developed no complications during the procedure.  IMPRESSIONS  1. Left ventricular ejection fraction, by estimation, is 40 to 45%. The left ventricle has mildly decreased function. The left ventricle demonstrates global hypokinesis.  2. Right ventricular systolic function is mildly reduced. The right ventricular size is mildly enlarged.  3. Left atrial size was severely dilated. A left atrial/left atrial appendage thrombus was detected.  4. Small to moderate. The pericardial effusion is circumferential. There is no evidence of cardiac tamponade.  5. The mitral valve is abnormal. Mild to moderate mitral valve regurgitation.  6. The aortic valve is tricuspid. Aortic valve regurgitation is mild to moderate. No aortic stenosis is present.  7. There is borderline dilatation of the ascending aorta, measuring 36 mm. There is mild (Grade II) layered plaque involving the descending aorta. FINDINGS  Left Ventricle: Left ventricular  ejection fraction, by estimation, is 40 to 45%. The left ventricle has mildly decreased function. The left ventricle demonstrates  global hypokinesis. The left ventricular internal cavity size was normal in size. Right Ventricle: The right ventricular size is mildly enlarged. Right vetricular wall thickness was not well visualized. Right ventricular systolic function is mildly reduced. Left Atrium: Left atrial size was severely dilated. A left atrial/left atrial appendage thrombus was detected. Pericardium: Small to moderate. The pericardial effusion is circumferential. There is no evidence of cardiac tamponade. Mitral Valve: The mitral valve is abnormal. Mild to moderate mitral valve regurgitation. Tricuspid Valve: The tricuspid valve is normal in structure. Tricuspid valve regurgitation is mild. Aortic Valve: The aortic valve is tricuspid. Aortic valve regurgitation is mild to moderate. No aortic stenosis is present. Pulmonic Valve: The pulmonic valve was not well visualized. Pulmonic valve regurgitation is trivial. No evidence of pulmonic stenosis. Aorta: There is borderline dilatation of the ascending aorta, measuring 36 mm. There is mild (Grade II) layered plaque involving the descending aorta. Pulmonary Artery: The pulmonary artery is not well seen. Venous: The left upper pulmonary vein, left lower pulmonary vein and right upper pulmonary vein are normal.   AORTA Ao Asc diam: 3.60 cm Lavona Mound Tobb DO Electronically signed by Thomasene Ripple DO Signature Date/Time: 11/05/2023/5:08:44 PM    Final    EP STUDY Result Date: 11/05/2023 See surgical note for result.  CT Angio Chest Pulmonary Embolism (PE) W or WO Contrast Result Date: 11/04/2023 CLINICAL DATA:  Suspected pulmonary embolism. EXAM: CT ANGIOGRAPHY CHEST WITH CONTRAST TECHNIQUE: Multidetector CT imaging of the chest was performed using the standard protocol during bolus administration of intravenous contrast. Multiplanar CT image reconstructions and MIPs were obtained to evaluate the vascular anatomy. RADIATION DOSE REDUCTION: This exam was performed according to the departmental  dose-optimization program which includes automated exposure control, adjustment of the mA and/or kV according to patient size and/or use of iterative reconstruction technique. CONTRAST:  75mL OMNIPAQUE IOHEXOL 350 MG/ML SOLN COMPARISON:  None Available. FINDINGS: Cardiovascular: There is moderate severity calcification of the aortic arch, without evidence of aortic aneurysm. Satisfactory opacification of the pulmonary arteries to the segmental level. No evidence of pulmonary embolism. There is mild cardiomegaly. No pericardial effusion. Mediastinum/Nodes: Mild pretracheal lymphadenopathy is seen. Thyroid gland, trachea, and esophagus demonstrate no significant findings. Lungs/Pleura: Mild lingular, posterolateral right middle lobe and posterior bilateral lower lobe scarring and/or atelectasis is seen, left greater than right. There is a small left pleural effusion. No pneumothorax is identified. Upper Abdomen: Noninflamed diverticula are seen throughout the splenic flexure. Musculoskeletal: No chest wall abnormality. No acute or significant osseous findings. Review of the MIP images confirms the above findings. IMPRESSION: 1. No evidence of pulmonary embolism. 2. Mild lingular, right middle lobe and posterior bilateral lower lobe scarring and/or atelectasis, left greater than right. 3. Small left pleural effusion. 4. Colonic diverticulosis. 5. Aortic atherosclerosis. Aortic Atherosclerosis (ICD10-I70.0). Electronically Signed   By: Aram Candela M.D.   On: 11/04/2023 21:23   DG Chest Port 1 View Result Date: 11/04/2023 CLINICAL DATA:  Atrial fibrillation EXAM: PORTABLE CHEST 1 VIEW COMPARISON:  02/13/2023 FINDINGS: Mild cardiomegaly. No confluent airspace opacities, effusions or edema. Minimal linear densities in the lung bases, favor atelectasis. No acute bony abnormality. IMPRESSION: Cardiomegaly.  No overt edema. Minimal bibasilar atelectasis. Electronically Signed   By: Charlett Nose M.D.   On: 11/04/2023  17:23    Cardiac Studies   TEE/ DCCV 11/05/2023: Impressions: 1. Left ventricular ejection fraction, by  estimation, is 40 to 45%. The  left ventricle has mildly decreased function. The left ventricle  demonstrates global hypokinesis.   2. Right ventricular systolic function is mildly reduced. The right  ventricular size is mildly enlarged.   3. Left atrial size was severely dilated. A left atrial/left atrial  appendage thrombus was detected.   4. Small to moderate. The pericardial effusion is circumferential. There  is no evidence of cardiac tamponade.   5. The mitral valve is abnormal. Mild to moderate mitral valve  regurgitation.   6. The aortic valve is tricuspid. Aortic valve regurgitation is mild to  moderate. No aortic stenosis is present.   7. There is borderline dilatation of the ascending aorta, measuring 36  mm. There is mild (Grade II) layered plaque involving the descending  aorta.    Patient Profile     78 y.o. female with a history of diastolic dysfunction, hypertension, hyperlipidemia, polymyalgia rheumatica, giant cell arteritis, migraines, and anxiety/ depression who was admitted on 11/04/2023 for new onset atrial fibrillation with RVR and acute on chronic CHF after presenting with palpitations, shortness of breath, and dizziness for at least a few days.  Assessment & Plan    New Onset Atrial Fibrillation Left Atrial Appendage Thrombus Patient presented with new onset rapid atrial fibrillation. Rates initially in the 150s. Likely triggered by recent cortisone injection in knee.  She went for TEE/ DCCV on 3/18; however, unfortunately TEE showed a left atrial appendage thrombus so DCCV was unable to be performed. TEE also showed LVEF of 40-45%. Rates have been difficult to control even on max dose of IV Diltiazem. She was started on Digoxin on 3/18. - Rate overall improved. Currently in the 90s to 110s but will quickly increase to 130s with minimal activity. -  Potassium 4.2 today and Magnesium 2.0 today.  - TSH normal but free T4 slightly elevated. - IV Diltiazem was stopped overnight due to low BP.  - She was started on Lopressor 25mg  every 6 hours last night with hold parameters for BP. She has only been able to receive one dose of this due to BP. However, IV Diltiazem has now been stopped and BP improved this mornings so hopefully will be able to receive more of this today. Continue current dosing. Can consolidate to Toprol-XL prior to discharge. - She received one dose of IV Digoxin yesterday. Will start PO Digoxin 0.125mg  daily today. - If we are unable to control heart rates, will likely need to start Amiodarone. Although this is will be our last resort given known left atrial appendage thrombus and increase stroke risk if she chemically converts. - CHA2DS-VASc = 6 (CHF, aortic atherosclerosis noted on CT, HTN, age x2, gender). Continue Eliquis 5mg  twice daily. - Will focus on rate control for now and then can repeat TEE/ DCCV after several weeks of uninterrupted anticoagulation.  Acute HFrEF BNP elevated at 629. Chest x-ray showed cardiomegaly but no overt edema. CTA was negative for PE or edema but showed a small left pleural effusion. TEE showed LVEF of 40-45% with global hypokinesis, mildly reduced RV function, severe left atrial enlargement, and small to moderate pericardial effusion with no evidence of tamponade.  - She reports feeling more short of breath this but does not appear significantly volume overloaded. - GDMT limited right now due to soft BP.   - Continue home Lasix 40mg  daily. - Continue Lopressor now for rate control. Can transition to Toprol-XL prior to discharge given reduced EF.  - Will hold  off on adding any other GDMT right now given soft BP and need for more rate control. - Continue to monitor daily weights, strict I/Os, and renal function. - Suspect cardiomyopathy is tachy-mediated in nature. Recommend repeat Echo 2-3  months after restoration of sinus rhythm and optimization of GDMT. If EF still down at that time, can consider ischemic evaluation.   Hypertension BP soft overnight with systolic BP in the 80s. Improved this morning after IV Diltiazem was stopped. - Continue Lopressor as above.  Hyperlipidemia - Continue home Crestor 5mg  every other day.  Otherwise, per primary team: - Polyalgia rheumatica - Temporal arteritis - Autoimmune hemolytic anemia s/p splenectomy  - Leukocytosis (likely secondary to chronic steroid use) - Abnormal thyroid function test   For questions or updates, please contact [facility] Please consult [email or web address] for contact info under        Signed, Corrin Parker, PA-C  11/06/2023, 7:31 AM    I have personally seen and examined the patient.  My HPI, Exam, and assessment and plan are below, independent of the NPP above.  Ms. Eskridge, with atrial fibrillation, presents with heart rate control issues. She is experiencing heart rate control issues related to her atrial fibrillation. Her heart rate is manageable at rest but increases significantly upon standing or moving. She has been on Lopressor 25 mg every six hours, but only received one dose last night due to low blood pressure, which was recorded as low as 80/86 overnight but improved to the 110s today. She is currently on a total dose of 100 mg of Lopressor, taken as 50 mg twice daily at home. She has been taken off diltiazem drips. She is also on oral anticoagulation therapy. A CT scan did not show any pronounced issues, but there is a concern about a possible thrombus in the left atrial appendage, which was not clearly visible on the scan. She experiences 'fluttering' and 'shortness of breath', which she attributes to anxiety.   Exam notable for  Gen: no distress Cardiac: No Rubs or Gallops, no Murmur, IRIR tachycardia, +2 radial pulses Respiratory: Clear to auscultation bilaterally, normal effort,  normal  respiratory rate GI: Soft, nontender, non-distended  MS: No  edema;  moves all extremities Integument: Skin feels warm Neuro:  At time of evaluation, alert and oriented to person/place/time/situation  Psych: Normal affect, patient feels ok  TEE Reviewed- distal LAA thrombus  Atrial Fibrillation with Rapid Ventricular Response Experiencing atrial fibrillation with rapid ventricular response, particularly when active, as heart rate increases significantly upon standing. Currently on metoprolol for rate control, but blood pressure limits dosing. Anxiety may contribute to symptoms like dyspnea and palpitations. Presence of a left atrial appendage thrombus complicates management, limiting cardioversion options. DE cardioversion is deferred until thrombus resolution due to embolic risk.  - Adjust metoprolol to 50 mg BID (succinate split)monitoring blood pressure closely.  - Maintain oral anticoagulation therapy.  - Consider TEE cardioversion in 3-4 weeks, pending rate control and thrombus resolution.   I suspect she will leak with HFmrEF and no GDMT save for BB and with heart rates ~ 100-120  Riley Lam, MD FASE [facility] Cardiologist [facility]  [address] [place], [address] [place], [place] [address] [phone]  1:15 PM

## 2023-11-06 NOTE — Progress Notes (Signed)
 PROGRESS NOTE Michelle Wilcox  WUJ:[id number] DOB: 22-Aug-1945 DOA: 11/04/2023 PCP: Gweneth Dimitri, MD  Brief Narrative/Hospital Course: 78 y.o. female with history of polymyalgia rheumatica, temporal arteritis, autoimmune hemolytic anemia status post splenectomy, diastolic dysfunction, hypertension has been feeling weak tired and fatigued and occasional fluttering sensation in her chest over the last 2 weeks since she got cortisone shot for her right knee.  Due to the symptoms patient presented to her primary care physician and at primary care physician's office patient was found to be tachycardic and in A-fib and was referred to the ER.  She was subsequently hospitalized for further management.   Consultants: Cardiology   Procedures: TEE 3/18   Subjective: Seen and examined Overnight Cardizem drip discontinued due to hypotension. is afebrile She slept okay last night but does not feeling good this morning Labs with stable potassium and mag mild leukocytosis and stable hemoglobin   Assessment and Plan: Principal Problem:   Atrial fibrillation (HCC) Active Problems:   Autoimmune hemolytic anemia (HCC)   Essential hypertension   Diastolic dysfunction without heart failure   Temporal arteritis (HCC)   PMR (polymyalgia rheumatica) (HCC)   Atrial fibrillation with rapid ventricular response (HCC)   Borderline abnormal TFTs   Macrocytosis   New onset atrial fibrillation Left atrial appendage thrombus: Initial rate 150s likely triggered by cortisone injection S/P TEE/DCCV 3/15 has LAD thrombus so DCCV was unable to be performed.  TEE showed EF 40-45%. Overnight IV Cardizem discontinued due to low blood pressure, TSH normal but free free T4 slightly elevated.  Cardiology managing monitor electrolytes, continue telemetry.  Now on digoxin, beta-blocker as tolerated.  Continue Eliquis.  Acute HFrEF: EF 40-55% TEE-cardiomyopathy likely in the setting of new onset A-fib.  CT negative  for PE but small pleural effusion. On Lopressor for rate control, GDMT limited due to soft BP, continue monitor intake output Daily weight, renal function  Hypertension: BP soft continue meds for renal failure and CHF as above.  Polymyalgia rheumatica Temporal arteritis Autoimmune hemolytic anemia S/P splenectomy Leukocytosis likely from chronic steroid use: Patient chronic steroid.Continue the same  Abnormal thyroid test: TSH normal but free T4 mildly elevated likely sick euthyroid will need outpatient follow-up with repeat thyroid function test in 3 to 4 weeks.  Class I Obesity: Patient's Body mass index is 32.1 kg/m. : Will benefit with PCP follow-up, weight loss  healthy lifestyle and outpatient sleep evaluation.   DVT prophylaxis:  Code Status:   Code Status: Full Code Family Communication: plan of care discussed with patient/ at bedside. Patient status is: Remains hospitalized because of severity of illness Level of care: Telemetry Cardiac   Dispo: The patient is from: Home            Anticipated disposition: TBD Objective: Vitals last 24 hrs: Vitals:   11/06/23 0428 11/06/23 0727 11/06/23 0833 11/06/23 1010  BP:  (!) 140/84 105/76   Pulse:  97 (!) 115 91  Resp:  17    Temp: 98.2 F (36.8 C) 98.7 F (37.1 C)    TempSrc: Oral Oral    SpO2:  95%    Weight:      Height:       Weight change:   Physical Examination: General exam: alert awake,at baseline, older than stated age HEENT:Oral mucosa moist, Ear/Nose WNL grossly Respiratory system: Bilaterally clear BS,no use of accessory muscle Cardiovascular system: S1 & S2 +, No JVD. Gastrointestinal system: Abdomen soft,NT,ND, BS+ Nervous System: Alert, awake, moving all extremities,and following commands.  Extremities: LE edema neg,distal peripheral pulses palpable and warm.  Skin: No rashes,no icterus. MSK: Normal muscle bulk,tone, power   Medications reviewed:  Scheduled Meds:  apixaban  5 mg Oral BID    digoxin  0.125 mg Oral Daily   furosemide  40 mg Oral Daily   loratadine  10 mg Oral Daily   metoprolol succinate  50 mg Oral BID   metoprolol tartrate  25 mg Oral Q6H   predniSONE  5 mg Oral Q breakfast   rosuvastatin  5 mg Oral Q48H   Continuous Infusions:  diltiazem (CARDIZEM) infusion Stopped (11/06/23 0233)   magnesium sulfate bolus IVPB        Diet Order             Diet Heart Room service appropriate? Yes; Fluid consistency: Thin  Diet effective now                   Intake/Output Summary (Last 24 hours) at 11/06/2023 1143 Last data filed at 11/06/2023 1141 Gross per 24 hour  Intake 1226.47 ml  Output 900 ml  Net 326.47 ml   Net IO Since Admission: 1,527.38 mL [11/06/23 1143]  Wt Readings from Last 3 Encounters:  11/04/23 84.8 kg  05/20/23 82.9 kg  04/09/23 83.6 kg     Unresulted Labs (From admission, onward)     Start     Ordered   11/05/23 0500  CBC  Daily,   R      11/04/23 2146          Data Reviewed: I have personally reviewed following labs and imaging studies CBC: Recent Labs  Lab 11/04/23 1619 11/05/23 0341 11/05/23 0456 11/06/23 0353  WBC 11.5* 12.8* 12.8* 13.1*  NEUTROABS  --   --  8.6*  --   HGB 13.4 12.1 12.4 11.7*  HCT 40.8 37.0 37.7 36.1  MCV 100.5* 98.7 99.5 99.4  PLT 332 312 309 289   Basic Metabolic Panel:  Recent Labs  Lab 11/04/23 1619 11/05/23 0456 11/06/23 0353  NA 137 140 139  K 4.5 3.5 4.2  CL 100 105 107  CO2 28 24 25   GLUCOSE 116* 96 128*  BUN 17 12 9   CREATININE 0.84 0.77 0.77  CALCIUM 8.8* 8.3* 7.8*  MG  --  1.9 2.0   GFR: Estimated Creatinine Clearance: 61 mL/min (by C-G formula based on SCr of 0.77 mg/dL). Liver Function Tests:  Recent Labs  Lab 11/05/23 0456 11/06/23 0353  AST 39 29  ALT 39 28  ALKPHOS 57 50  BILITOT 0.7 0.4  PROT 5.7* 5.4*  ALBUMIN 3.2* 3.0*   Recent Labs    11/04/23 1619  TSH 1.266  FREET4 1.17*   Sepsis Labs: No results for input(s): "PROCALCITON", "LATICACIDVEN"  in the last 168 hours. No results found for this or any previous visit (from the past 240 hours).  Antimicrobials/Microbiology: Anti-infectives (From admission, onward)    None      No results found for: "SDES", "SPECREQUEST", "CULT", "REPTSTATUS"   Radiology Studies: ECHO TEE Result Date: 11/05/2023    TRANSESOPHOGEAL ECHO REPORT   Patient Name:   Michelle Wilcox Date of Exam: 11/05/2023 Medical Rec #:  [id number]               Height:       64.0 in Accession #:    [id number]              Weight:       187.0  lb Date of Birth:  [birth year]               BSA:          1.901 m Patient Age:    77 years                BP:           141/110 mmHg Patient Gender: F                       HR:           126 bpm. Exam Location:  Inpatient Procedure: Cardiac Doppler, Color Doppler and Transesophageal Echo (Both            Spectral and Color Flow Doppler were utilized during procedure). Indications:     Cardioversion  History:         Patient has no prior history of Echocardiogram examinations.  Sonographer:     Harriette Bouillon RDCS Referring Phys:  [id number] CHRISTOPHER RONALD BERGE Diagnosing Phys: Thomasene Ripple DO PROCEDURE: The transesophogeal probe was passed without difficulty through the esophogus of the patient. Sedation performed by different physician. The patient developed no complications during the procedure.  IMPRESSIONS  1. Left ventricular ejection fraction, by estimation, is 40 to 45%. The left ventricle has mildly decreased function. The left ventricle demonstrates global hypokinesis.  2. Right ventricular systolic function is mildly reduced. The right ventricular size is mildly enlarged.  3. Left atrial size was severely dilated. A left atrial/left atrial appendage thrombus was detected.  4. Small to moderate. The pericardial effusion is circumferential. There is no evidence of cardiac tamponade.  5. The mitral valve is abnormal. Mild to moderate mitral valve regurgitation.  6. The aortic valve is  tricuspid. Aortic valve regurgitation is mild to moderate. No aortic stenosis is present.  7. There is borderline dilatation of the ascending aorta, measuring 36 mm. There is mild (Grade II) layered plaque involving the descending aorta. FINDINGS  Left Ventricle: Left ventricular ejection fraction, by estimation, is 40 to 45%. The left ventricle has mildly decreased function. The left ventricle demonstrates global hypokinesis. The left ventricular internal cavity size was normal in size. Right Ventricle: The right ventricular size is mildly enlarged. Right vetricular wall thickness was not well visualized. Right ventricular systolic function is mildly reduced. Left Atrium: Left atrial size was severely dilated. A left atrial/left atrial appendage thrombus was detected. Pericardium: Small to moderate. The pericardial effusion is circumferential. There is no evidence of cardiac tamponade. Mitral Valve: The mitral valve is abnormal. Mild to moderate mitral valve regurgitation. Tricuspid Valve: The tricuspid valve is normal in structure. Tricuspid valve regurgitation is mild. Aortic Valve: The aortic valve is tricuspid. Aortic valve regurgitation is mild to moderate. No aortic stenosis is present. Pulmonic Valve: The pulmonic valve was not well visualized. Pulmonic valve regurgitation is trivial. No evidence of pulmonic stenosis. Aorta: There is borderline dilatation of the ascending aorta, measuring 36 mm. There is mild (Grade II) layered plaque involving the descending aorta. Pulmonary Artery: The pulmonary artery is not well seen. Venous: The left upper pulmonary vein, left lower pulmonary vein and right upper pulmonary vein are normal.   AORTA Ao Asc diam: 3.60 cm Lavona Mound Tobb DO Electronically signed by Thomasene Ripple DO Signature Date/Time: 11/05/2023/5:08:44 PM    Final    EP STUDY Result Date: 11/05/2023 See surgical note for result.  CT Angio Chest Pulmonary Embolism (PE) W or WO Contrast Result  Date:  11/04/2023 CLINICAL DATA:  Suspected pulmonary embolism. EXAM: CT ANGIOGRAPHY CHEST WITH CONTRAST TECHNIQUE: Multidetector CT imaging of the chest was performed using the standard protocol during bolus administration of intravenous contrast. Multiplanar CT image reconstructions and MIPs were obtained to evaluate the vascular anatomy. RADIATION DOSE REDUCTION: This exam was performed according to the departmental dose-optimization program which includes automated exposure control, adjustment of the mA and/or kV according to patient size and/or use of iterative reconstruction technique. CONTRAST:  75mL OMNIPAQUE IOHEXOL 350 MG/ML SOLN COMPARISON:  None Available. FINDINGS: Cardiovascular: There is moderate severity calcification of the aortic arch, without evidence of aortic aneurysm. Satisfactory opacification of the pulmonary arteries to the segmental level. No evidence of pulmonary embolism. There is mild cardiomegaly. No pericardial effusion. Mediastinum/Nodes: Mild pretracheal lymphadenopathy is seen. Thyroid gland, trachea, and esophagus demonstrate no significant findings. Lungs/Pleura: Mild lingular, posterolateral right middle lobe and posterior bilateral lower lobe scarring and/or atelectasis is seen, left greater than right. There is a small left pleural effusion. No pneumothorax is identified. Upper Abdomen: Noninflamed diverticula are seen throughout the splenic flexure. Musculoskeletal: No chest wall abnormality. No acute or significant osseous findings. Review of the MIP images confirms the above findings. IMPRESSION: 1. No evidence of pulmonary embolism. 2. Mild lingular, right middle lobe and posterior bilateral lower lobe scarring and/or atelectasis, left greater than right. 3. Small left pleural effusion. 4. Colonic diverticulosis. 5. Aortic atherosclerosis. Aortic Atherosclerosis (ICD10-I70.0). Electronically Signed   By: Aram Candela M.D.   On: 11/04/2023 21:23   DG Chest Port 1  View Result Date: 11/04/2023 CLINICAL DATA:  Atrial fibrillation EXAM: PORTABLE CHEST 1 VIEW COMPARISON:  02/13/2023 FINDINGS: Mild cardiomegaly. No confluent airspace opacities, effusions or edema. Minimal linear densities in the lung bases, favor atelectasis. No acute bony abnormality. IMPRESSION: Cardiomegaly.  No overt edema. Minimal bibasilar atelectasis. Electronically Signed   By: Charlett Nose M.D.   On: 11/04/2023 17:23     LOS: 1 day   Total time spent in review of labs and imaging, patient evaluation, formulation of plan, documentation and communication with family: 35 minutes  Lanae Boast, MD  [facility]  11/06/2023, 11:43 AM

## 2023-11-06 NOTE — Plan of Care (Signed)
  Problem: Elimination: Goal: Will not experience complications related to bowel motility Outcome: Progressing   Problem: Nutrition: Goal: Adequate nutrition will be maintained Outcome: Progressing   Problem: Pain Managment: Goal: General experience of comfort will improve and/or be controlled Outcome: Progressing   Problem: Safety: Goal: Ability to remain free from injury will improve Outcome: Progressing

## 2023-11-06 NOTE — Progress Notes (Signed)
 Notified by staff patient remains in AF, HR 120s-160s. Similarly described in note today - will jump to 130s. Digoxin started earlier today with plan to titrate metoprolol with PM Toprol dose. SBP stable 133/86. Patient asymptomatic. Advised staff to give PM Toprol dose now and follow. Patient had LAA thrombus so DCCV unable to be performed this admission; recommended to focus on rate control as we are able.

## 2023-11-06 NOTE — Telephone Encounter (Signed)
 Patient Product/process development scientist completed.    The patient is insured through [facility]. Patient has Medicare and is not eligible for a copay card, but may be able to apply for patient assistance or Medicare RX Payment Plan (Patient Must reach out to their plan, if eligible for payment plan), if available.    Ran test claim for Eliquis 5 mg and the current 30 day co-pay is $420.00 due to a $375.00 deductible.  Will be $45.00 once deductible is met.   This test claim was processed through [facility]- copay amounts may vary at other pharmacies due to pharmacy/plan contracts, or as the patient moves through the different stages of their insurance plan.     Roland Earl, CPHT Pharmacy Technician III Certified Patient Advocate [facility] Pharm[facility] Patient Advocate Team Direct Number: [phone]  Fax: [phone]

## 2023-11-06 NOTE — Plan of Care (Signed)
  Problem: Health Behavior/Discharge Planning: Goal: Ability to manage health-related needs will improve Outcome: Progressing   Problem: Clinical Measurements: Goal: Respiratory complications will improve Outcome: Progressing Goal: Cardiovascular complication will be avoided Outcome: Progressing   Problem: Activity: Goal: Risk for activity intolerance will decrease Outcome: Progressing   Problem: Elimination: Goal: Will not experience complications related to urinary retention Outcome: Progressing   Problem: Safety: Goal: Ability to remain free from injury will improve Outcome: Progressing

## 2023-11-06 NOTE — Progress Notes (Signed)
   11/06/23 0102  Provider Notification  Provider Name/Title Dr Cherly Beach  Date Provider Notified 11/06/23  Time Provider Notified 0219  Method of Notification Page  Notification Reason Other (Comment) (HR goes to low 50s nonsustained, HR 50s-80s on afib with onoging Cardizem gtt. BP 116/54)  Provider response Other (Comment) (decrease cardizem gtt to 10mg /hr and may decrease if HR still goes 50s.)  Date of Provider Response 11/06/23  Time of Provider Response 0219    At 0226H, BP 84/49, stopped Cardizem gtt. Patient denies any symptoms. Notified Cardiology MD on call. Rechecked BP 94/58.   Felicity Coyer, RN

## 2023-11-06 NOTE — Hospital Course (Addendum)
 78 y.o. female with history of polymyalgia rheumatica, temporal arteritis, autoimmune hemolytic anemia status post splenectomy, diastolic dysfunction, hypertension has been feeling weak tired and fatigued and occasional fluttering sensation in her chest over the last 2 weeks since she got cortisone shot for her right knee.  Due to the symptoms patient presented to her primary care physician and at primary care physician's office patient was found to be tachycardic and in A-fib and was referred to the ER.  She was subsequently hospitalized for further management.  Patient underwent cardiology consultation and TEE on 8/18 EF 40-45%, had left atrial appendage and DCCV was deferred> managed with diuretics and transition to p.o. Lasix and Eliquis, needed cardizem drip for new onset A-fib. Initially patient declined amiodarone but subsequently agreed on 3/21, got loaded with IV amiodarone and transitioned to p.o. 3/23.  Consultants: Cardiology   Procedures: TEE 3/18:EF 40-45%,LA appendageal thrombus so DCCV was deferred and kept on Eliquis.  Discharge diagnoses:  New onset atrial fibrillation Left atrial appendage thrombus: Initial  HR -150s likely triggered by cortisone injection >S/P TEE 3/15 showed-LA appendegeal thrombus- DCCV deferrd, TEE showed EF 40-45%.  Patient agreed for amiodarone infusion 3/21-changing to oral amiodarone 400 twice daily x 5 days then 200 x 2 weeks then 200 daily thereafter.  Discharged with cardiology this morning we will plan to ambulate and monitor heart rate.   Continue digoxin, Eliquis, metoprolol as ordered She does not feel ready for home today   Acute HFrEF: EF 40-55% TEE- likely in the setting of new onset A-fib. CTA negative for PE but small pleural effusion.   Hypertension hx: Hypotension 3/21 am-rapid response-s/p 500 cc bolus NS: BP stable   Polymyalgia rheumatica-Temporal arteritis Chronic arthritis and pain Autoimmune hemolytic anemia S/P  splenectomy Leukocytosis likely from chronic steroid use: Received cortisone injection as outpatient. Continue her home chronic steroids low-dose.   Continue Voltaren gel Tylenol for pain management- PTA on 1300 mg bid- here 1 gm tid prn,cont neurontin 3/21   Abnormal thyroid test: TSH normal but free T4 mildly elevated likely sick euthyroid will need outpatient follow-up with repeat thyroid function test in 3 to 4 weeks.   Class I Obesity: Patient's Body mass index is 32.1 kg/m. : Will benefit with PCP follow-up, weight loss  healthy lifestyle and outpatient sleep evaluation.   Anxiety:  Significant component.Continue Xanax PRN

## 2023-11-07 DIAGNOSIS — I3139 Other pericardial effusion (noninflammatory): Secondary | ICD-10-CM | POA: Diagnosis not present

## 2023-11-07 DIAGNOSIS — I480 Paroxysmal atrial fibrillation: Secondary | ICD-10-CM | POA: Diagnosis not present

## 2023-11-07 DIAGNOSIS — I10 Essential (primary) hypertension: Secondary | ICD-10-CM | POA: Diagnosis not present

## 2023-11-07 LAB — BASIC METABOLIC PANEL
Anion gap: 12 (ref 5–15)
BUN: 14 mg/dL (ref 8–23)
CO2: 24 mmol/L (ref 22–32)
Calcium: 8.5 mg/dL — ABNORMAL LOW (ref 8.9–10.3)
Chloride: 103 mmol/L (ref 98–111)
Creatinine, Ser: 0.83 mg/dL (ref 0.44–1.00)
GFR, Estimated: >60 mL/min (ref >60)
Glucose, Bld: 95 mg/dL (ref 70–99)
Potassium: 3.4 mmol/L — ABNORMAL LOW (ref 3.5–5.1)
Sodium: 139 mmol/L (ref 135–145)

## 2023-11-07 LAB — CBC
HCT: 41.8 % (ref 36.0–46.0)
Hemoglobin: 13.5 g/dL (ref 12.0–15.0)
MCH: 32.1 pg (ref 26.0–34.0)
MCHC: 32.3 g/dL (ref 30.0–36.0)
MCV: 99.3 fL (ref 80.0–100.0)
Platelets: 350 10*3/uL (ref 150–400)
RBC: 4.21 MIL/uL (ref 3.87–5.11)
RDW: 14.9 % (ref 11.5–15.5)
WBC: 13.2 10*3/uL — ABNORMAL HIGH (ref 4.0–10.5)
nRBC: 0 % (ref 0.0–0.2)

## 2023-11-07 MED ORDER — DILTIAZEM HCL 30 MG PO TABS
30.0000 mg | ORAL_TABLET | Freq: Four times a day (QID) | ORAL | Status: DC
  Administered 2023-11-07: 30 mg via ORAL
  Filled 2023-11-07: qty 1

## 2023-11-07 MED ORDER — DILTIAZEM HCL 60 MG PO TABS
60.0000 mg | ORAL_TABLET | Freq: Four times a day (QID) | ORAL | Status: DC
  Administered 2023-11-07 – 2023-11-08 (×3): 60 mg via ORAL
  Filled 2023-11-07 (×3): qty 1

## 2023-11-07 MED ORDER — SENNOSIDES-DOCUSATE SODIUM 8.6-50 MG PO TABS
1.0000 | ORAL_TABLET | Freq: Every day | ORAL | Status: DC | PRN
  Administered 2023-11-07 – 2023-11-09 (×2): 1 via ORAL
  Filled 2023-11-07 (×2): qty 1

## 2023-11-07 MED ORDER — ALPRAZOLAM 0.25 MG PO TABS
0.2500 mg | ORAL_TABLET | Freq: Two times a day (BID) | ORAL | Status: DC | PRN
  Administered 2023-11-07 – 2023-11-10 (×3): 0.25 mg via ORAL
  Filled 2023-11-07 (×5): qty 1

## 2023-11-07 NOTE — TOC Initial Note (Signed)
 Transition of Care [facility]) - Initial/Assessment Note    Patient Details  Name: Michelle Wilcox MRN: [id number] Date of Birth: [birth year]  [facility] CM/SW Contact:    Gala Lewandowsky, RN Phone Number: 11/07/2023, 1:45 PM  Clinical Narrative: Patient presented for tachycardia-atrial fib. PTA patient reports that the patient is from home with spouse in which she is the caregiver of  spouse who has stage 4 cancer. Patient was previously receiving outpatient PT with [facility] location- will need resumption orders once stable for transition home. Case Manager will continue to follow for transition of care needs.                   Expected Discharge Plan: Home/Self Care Barriers to Discharge: Continued Medical Work up  Patient Goals and CMS Choice Patient states their goals for this hospitalization and ongoing recovery are:: plan is to return home once stable   Choice offered to / list presented to : NA  Expected Discharge Plan and Services   Discharge Planning Services: CM Consult   Living arrangements for the past 2 months: Single Family Home   Prior Living Arrangements/Services Living arrangements for the past 2 months: Single Family Home Lives with:: Spouse Patient language and need for interpreter reviewed:: Yes Do you feel safe going back to the place where you live?: Yes      Need for Family Participation in Patient Care: No (Comment) Care giver support system in place?: No (comment)   Criminal Activity/Legal Involvement Pertinent to Current Situation/Hospitalization: No - Comment as needed  Activities of Daily Living   ADL Screening (condition at time of admission) Independently performs ADLs?: Yes (appropriate for developmental age) Is the patient deaf or have difficulty hearing?: No Does the patient have difficulty seeing, even when wearing glasses/contacts?: No Does the patient have difficulty concentrating, remembering, or making  decisions?: No  Permission Sought/Granted Permission sought to share information with : Family Supports, Case Manager   Emotional Assessment Appearance:: Appears stated age Attitude/Demeanor/Rapport: Engaged Affect (typically observed): Appropriate Orientation: : Oriented to Self, Oriented to Place, Oriented to  Time, Oriented to Situation Alcohol / Substance Use: Not Applicable Psych Involvement: No (comment)  Admission diagnosis:  Atrial fibrillation (HCC) [I48.91] Atrial fibrillation with rapid ventricular response (HCC) [I48.91] Atrial fibrillation with RVR (HCC) [I48.91] Patient Active Problem List   Diagnosis Date Noted   Atrial fibrillation with rapid ventricular response (HCC) 11/05/2023   Borderline abnormal TFTs 11/05/2023   Macrocytosis 11/05/2023   Atrial fibrillation (HCC) 11/04/2023   PMR (polymyalgia rheumatica) (HCC) 11/04/2023   Primary localized osteoarthrosis of multiple sites 06/15/2022   Diastolic dysfunction without heart failure 03/05/2018   DOE (dyspnea on exertion) 03/05/2018   Preop cardiovascular exam 03/05/2018   Mild reactive airways disease 08/04/2017   Volume overload 08/03/2017   Hypokalemia 08/03/2017   Essential hypertension 08/03/2017   Hyperlipidemia 08/03/2017   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   Hypersensitivity reaction 11/20/2016   Drusen (degenerative) of retina, bilateral 05/22/2016   Nuclear cataract 05/22/2016   Retinal hemorrhage of left eye 05/22/2016   Temporal arteritis (HCC) 05/22/2016   Hemolytic anemia (HCC) 03/19/2016   Breast mass, right 07/01/2012   PCP:  Gweneth Dimitri, MD Pharmacy:   [id number] [place], [place] - [address] AT [facility] [address] & [facility] [address] [place] [address] Phone: [phone] Fax: [phone]  [id number] [place], [place] - [address] AT  [address] OF [facility] [address] & [facility] [address] [place]  [address] Phone: [phone] Fax: [phone]  [facility] [address] [place] - [address] [place] [address] Phone: [phone] Fax: [phone]  [facility] [address] [place] [address] Phone: [phone] Fax: [phone]  Social Drivers of Health (SDOH) Social History: SDOH Screenings   Food Insecurity: No Food Insecurity (11/04/2023)  Housing: Low Risk  (11/04/2023)  Transportation Needs: No Transportation Needs (11/04/2023)  Utilities: Not At Risk (11/04/2023)  Social Connections: Socially Isolated (11/04/2023)  Tobacco Use: Low Risk  (11/05/2023)   SDOH Interventions:     Readmission Risk Interventions     No data to display

## 2023-11-07 NOTE — Plan of Care (Signed)
  Problem: Health Behavior/Discharge Planning: Goal: Ability to manage health-related needs will improve Outcome: Progressing   Problem: Clinical Measurements: Goal: Respiratory complications will improve Outcome: Progressing Goal: Cardiovascular complication will be avoided Outcome: Progressing   Problem: Nutrition: Goal: Adequate nutrition will be maintained Outcome: Progressing   Problem: Coping: Goal: Level of anxiety will decrease Outcome: Progressing   Problem: Elimination: Goal: Will not experience complications related to urinary retention Outcome: Progressing   Problem: Safety: Goal: Ability to remain free from injury will improve Outcome: Progressing

## 2023-11-07 NOTE — Progress Notes (Signed)
 PROGRESS NOTE Michelle Wilcox  AOZ:[id number] DOB: 26-Jan-1946 DOA: 11/04/2023 PCP: Gweneth Dimitri, MD  Brief Narrative/Hospital Course: 78 y.o. female with history of polymyalgia rheumatica, temporal arteritis, autoimmune hemolytic anemia status post splenectomy, diastolic dysfunction, hypertension has been feeling weak tired and fatigued and occasional fluttering sensation in her chest over the last 2 weeks since she got cortisone shot for her right knee.  Due to the symptoms patient presented to her primary care physician and at primary care physician's office patient was found to be tachycardic and in A-fib and was referred to the ER.  She was subsequently hospitalized for further management.  Patient underwent cardiology consultation and TEE on 8/18 EF 40-45%, had left atrial appendage and DCCV was deferred> managed with diuretics and transition to p.o. Lasix and Eliquis, needed cardizem drip for new onset A-fib.  Consultants: Cardiology   Procedures: TEE 3/18:EF 40-45 , LA appendageal thrombus + and DCCV was deferred.   Subjective: Seen and examined this morning Reports feeling anxious as her heart rate goes high when she does minimal ambulation currently was in 130s at rest this am Denies chest pain shortness of breath she does feel palpitations at times. 3/19: Ongoing A-fib with RVR digoxin continued metoprolol titrated   Assessment and Plan: Principal Problem:   Atrial fibrillation (HCC) Active Problems:   Autoimmune hemolytic anemia (HCC)   Essential hypertension   Diastolic dysfunction without heart failure   Temporal arteritis (HCC)   PMR (polymyalgia rheumatica) (HCC)   Atrial fibrillation with rapid ventricular response (HCC)   Borderline abnormal TFTs   Macrocytosis   New onset atrial fibrillation Left atrial appendage thrombus: Initial  HR -150s likely triggered by cortisone injection. S/P TEE/DCCV 3/15 has LAD thrombus so DCCV was unable to be performed.  TEE  showed EF 40-45%. Overnight IV Cardizem discontinued due to low blood pressure, TSH normal but free free T4 slightly elevated.  Cardiology managing-added oral Cardizem 3/20, continue digoxin 125 daily, Toprol-XL 50 twice daily, continue Eliquis  Acute HFrEF: EF 40-55% TEE-cardiomyopathy likely in the setting of new onset A-fib.  CT negative for PE but small pleural effusion. On Lopressor for rate control, GDMT limited due to soft BP, continue monitor intake output Daily weight, renal function  Hypertension: BP remains fairly controlled on meds for A-fib as above.    Polymyalgia rheumatica Temporal arteritis Autoimmune hemolytic anemia S/P splenectomy Leukocytosis likely from chronic steroid use: Patient on chronic steroid low-dose,continue the same  Abnormal thyroid test: TSH normal but free T4 mildly elevated likely sick euthyroid will need outpatient follow-up with repeat thyroid function test in 3 to 4 weeks.  Class I Obesity: Patient's Body mass index is 32.1 kg/m. : Will benefit with PCP follow-up, weight loss  healthy lifestyle and outpatient sleep evaluation.   DVT prophylaxis: Eliquis Code Status:   Code Status: Full Code Family Communication: plan of care discussed with patient/ at bedside. Patient status is: Remains hospitalized because of severity of illness Level of care: Telemetry Cardiac   Dispo: The patient is from: Home            Anticipated disposition: TBD pending cardiology clearance Objective: Vitals last 24 hrs: Vitals:   11/07/23 0656 11/07/23 0953 11/07/23 1148 11/07/23 1225  BP: 127/87 (!) 136/101 (!) 143/114 (!) 143/114  Pulse:  (!) 134 83   Resp: 17  18   Temp: 98.5 F (36.9 C)  98.1 F (36.7 C)   TempSrc: Oral  Oral   SpO2: 93%  96%  Weight:      Height:       Weight change:   Physical Examination: General exam: alert awake, oriented  HEENT:Oral mucosa moist, Ear/Nose WNL grossly Respiratory system: Bilaterally clear BS,no use of  accessory muscle Cardiovascular system: S1 & S2 +, irregular irregular, No JVD. Gastrointestinal system: Abdomen soft,NT,ND, BS+ Nervous System: Alert, awake, moving all extremities,and following commands. Extremities: LE edema neg,distal peripheral pulses palpable and warm.  Skin: No rashes,no icterus. MSK: Normal muscle bulk,tone, power   Medications reviewed:  Scheduled Meds:  apixaban  5 mg Oral BID   digoxin  0.125 mg Oral Daily   diltiazem  30 mg Oral Q6H   furosemide  40 mg Oral Daily   loratadine  10 mg Oral Daily   metoprolol succinate  50 mg Oral BID   predniSONE  5 mg Oral Q breakfast   rosuvastatin  5 mg Oral Q48H   Continuous Infusions:  magnesium sulfate bolus IVPB      Diet Order             Diet Heart Room service appropriate? Yes; Fluid consistency: Thin  Diet effective now                  Intake/Output Summary (Last 24 hours) at 11/07/2023 1236 Last data filed at 11/07/2023 1200 Gross per 24 hour  Intake 360 ml  Output 2250 ml  Net -1890 ml   Net IO Since Admission: -762.62 mL [11/07/23 1236]  Wt Readings from Last 3 Encounters:  11/04/23 84.8 kg  05/20/23 82.9 kg  04/09/23 83.6 kg     Unresulted Labs (From admission, onward)     Start     Ordered   11/07/23 0500  Basic metabolic panel  Daily,   R     Question:  Specimen collection method  Answer:  Lab=Lab collect   11/06/23 1423   11/05/23 0500  CBC  Daily,   R      11/04/23 2146          Data Reviewed: I have personally reviewed following labs and imaging studies CBC: Recent Labs  Lab 11/04/23 1619 11/05/23 0341 11/05/23 0456 11/06/23 0353 11/07/23 0830  WBC 11.5* 12.8* 12.8* 13.1* 13.2*  NEUTROABS  --   --  8.6*  --   --   HGB 13.4 12.1 12.4 11.7* 13.5  HCT 40.8 37.0 37.7 36.1 41.8  MCV 100.5* 98.7 99.5 99.4 99.3  PLT 332 312 309 289 350   Basic Metabolic Panel:  Recent Labs  Lab 11/04/23 1619 11/05/23 0456 11/06/23 0353 11/07/23 0830  NA 137 140 139 139  K 4.5  3.5 4.2 3.4*  CL 100 105 107 103  CO2 28 24 25 24   GLUCOSE 116* 96 128* 95  BUN 17 12 9 14   CREATININE 0.84 0.77 0.77 0.83  CALCIUM 8.8* 8.3* 7.8* 8.5*  MG  --  1.9 2.0  --    GFR: Estimated Creatinine Clearance: 58.8 mL/min (by C-G formula based on SCr of 0.83 mg/dL). Liver Function Tests:  Recent Labs  Lab 11/05/23 0456 11/06/23 0353  AST 39 29  ALT 39 28  ALKPHOS 57 50  BILITOT 0.7 0.4  PROT 5.7* 5.4*  ALBUMIN 3.2* 3.0*   Recent Labs    11/04/23 1619  TSH 1.266  FREET4 1.17*   Sepsis Labs: No results for input(s): "PROCALCITON", "LATICACIDVEN" in the last 168 hours. No results found for this or any previous visit (from the past 240 hours).  Antimicrobials/Microbiology: Anti-infectives (From admission, onward)    None      No results found for: "SDES", "SPECREQUEST", "CULT", "REPTSTATUS"   Radiology Studies: ECHO TEE Result Date: 11/05/2023    TRANSESOPHOGEAL ECHO REPORT   Patient Name:   LANEA Wilcox Date of Exam: 11/05/2023 Medical Rec #:  [id number]               Height:       64.0 in Accession #:    [id number]              Weight:       187.0 lb Date of Birth:  10-Nov-1945               BSA:          1.901 m Patient Age:    77 years                BP:           141/110 mmHg Patient Gender: F                       HR:           126 bpm. Exam Location:  Inpatient Procedure: Cardiac Doppler, Color Doppler and Transesophageal Echo (Both            Spectral and Color Flow Doppler were utilized during procedure). Indications:     Cardioversion  History:         Patient has no prior history of Echocardiogram examinations.  Sonographer:     Harriette Bouillon RDCS Referring Phys:  [id number] CHRISTOPHER RONALD BERGE Diagnosing Phys: Thomasene Ripple DO PROCEDURE: The transesophogeal probe was passed without difficulty through the esophogus of the patient. Sedation performed by different physician. The patient developed no complications during the procedure.  IMPRESSIONS  1. Left  ventricular ejection fraction, by estimation, is 40 to 45%. The left ventricle has mildly decreased function. The left ventricle demonstrates global hypokinesis.  2. Right ventricular systolic function is mildly reduced. The right ventricular size is mildly enlarged.  3. Left atrial size was severely dilated. A left atrial/left atrial appendage thrombus was detected.  4. Small to moderate. The pericardial effusion is circumferential. There is no evidence of cardiac tamponade.  5. The mitral valve is abnormal. Mild to moderate mitral valve regurgitation.  6. The aortic valve is tricuspid. Aortic valve regurgitation is mild to moderate. No aortic stenosis is present.  7. There is borderline dilatation of the ascending aorta, measuring 36 mm. There is mild (Grade II) layered plaque involving the descending aorta. FINDINGS  Left Ventricle: Left ventricular ejection fraction, by estimation, is 40 to 45%. The left ventricle has mildly decreased function. The left ventricle demonstrates global hypokinesis. The left ventricular internal cavity size was normal in size. Right Ventricle: The right ventricular size is mildly enlarged. Right vetricular wall thickness was not well visualized. Right ventricular systolic function is mildly reduced. Left Atrium: Left atrial size was severely dilated. A left atrial/left atrial appendage thrombus was detected. Pericardium: Small to moderate. The pericardial effusion is circumferential. There is no evidence of cardiac tamponade. Mitral Valve: The mitral valve is abnormal. Mild to moderate mitral valve regurgitation. Tricuspid Valve: The tricuspid valve is normal in structure. Tricuspid valve regurgitation is mild. Aortic Valve: The aortic valve is tricuspid. Aortic valve regurgitation is mild to moderate. No aortic stenosis is present. Pulmonic Valve: The pulmonic valve was not well visualized. Pulmonic valve regurgitation is  trivial. No evidence of pulmonic stenosis. Aorta: There is  borderline dilatation of the ascending aorta, measuring 36 mm. There is mild (Grade II) layered plaque involving the descending aorta. Pulmonary Artery: The pulmonary artery is not well seen. Venous: The left upper pulmonary vein, left lower pulmonary vein and right upper pulmonary vein are normal.   AORTA Ao Asc diam: 3.60 cm Kardie Tobb DO Electronically signed by Thomasene Ripple DO Signature Date/Time: 11/05/2023/5:08:44 PM    Final      LOS: 2 days   Total time spent in review of labs and imaging, patient evaluation, formulation of plan, documentation and communication with family: 35 minutes  Michelle Boast, MD  [facility]  11/07/2023, 12:36 PM

## 2023-11-07 NOTE — Plan of Care (Signed)

## 2023-11-07 NOTE — Progress Notes (Addendum)
 Rounding Note    Patient Name: Michelle Wilcox Date of Encounter: 11/07/2023  [facility] Cardiologist: Bryan Lemma, MD   Subjective   Denies any CP or SOB.   Inpatient Medications    Scheduled Meds:  apixaban  5 mg Oral BID   digoxin  0.125 mg Oral Daily   furosemide  40 mg Oral Daily   loratadine  10 mg Oral Daily   metoprolol succinate  50 mg Oral BID   predniSONE  5 mg Oral Q breakfast   rosuvastatin  5 mg Oral Q48H   Continuous Infusions:  diltiazem (CARDIZEM) infusion Stopped (11/06/23 0233)   magnesium sulfate bolus IVPB     PRN Meds: mouth rinse   Vital Signs    Vitals:   11/06/23 1930 11/07/23 0019 11/07/23 0645 11/07/23 0656  BP: 133/86 123/83  127/87  Pulse: (!) 129 (!) 39 (!) 104   Resp: 20 19 14 17   Temp: 98.5 F (36.9 C) 98.5 F (36.9 C)  98.5 F (36.9 C)  TempSrc: Oral Oral  Oral  SpO2: 92% 95% 94% 93%  Weight:      Height:        Intake/Output Summary (Last 24 hours) at 11/07/2023 0822 Last data filed at 11/07/2023 0743 Gross per 24 hour  Intake 600 ml  Output 2400 ml  Net -1800 ml      11/04/2023    4:25 PM 05/20/2023    9:28 AM 04/09/2023    1:37 PM  Last 3 Weights  Weight (lbs) 187 lb 182 lb 12.8 oz 184 lb 6.4 oz  Weight (kg) 84.823 kg 82.918 kg 83.643 kg      Telemetry    Atrial fibrillation with RVR, HR increased to 130s overnight - Personally Reviewed  ECG    Atrial fibrillation, HR in the 100s - Personally Reviewed  Physical Exam   GEN: No acute distress.   Neck: No JVD Cardiac: tachycardic, irregular, no murmurs, rubs, or gallops.  Respiratory: Clear to auscultation bilaterally. R basilar crackles GI: Soft, nontender, non-distended  MS: No edema; No deformity. Neuro:  Nonfocal  Psych: Normal affect   Labs    High Sensitivity Troponin:   Recent Labs  Lab 11/04/23 1619 11/04/23 1847  TROPONINIHS 14 15     Chemistry Recent Labs  Lab 11/04/23 1619 11/05/23 0456 11/06/23 0353  NA  137 140 139  K 4.5 3.5 4.2  CL 100 105 107  CO2 28 24 25   GLUCOSE 116* 96 128*  BUN 17 12 9   CREATININE 0.84 0.77 0.77  CALCIUM 8.8* 8.3* 7.8*  MG  --  1.9 2.0  PROT  --  5.7* 5.4*  ALBUMIN  --  3.2* 3.0*  AST  --  39 29  ALT  --  39 28  ALKPHOS  --  57 50  BILITOT  --  0.7 0.4  GFRNONAA >60 >60 >60  ANIONGAP 9 11 7     Lipids No results for input(s): "CHOL", "TRIG", "HDL", "LABVLDL", "LDLCALC", "CHOLHDL" in the last 168 hours.  Hematology Recent Labs  Lab 11/05/23 0341 11/05/23 0456 11/06/23 0353  WBC 12.8* 12.8* 13.1*  RBC 3.75* 3.79* 3.63*  HGB 12.1 12.4 11.7*  HCT 37.0 37.7 36.1  MCV 98.7 99.5 99.4  MCH 32.3 32.7 32.2  MCHC 32.7 32.9 32.4  RDW 14.9 14.9 14.9  PLT 312 309 289   Thyroid  Recent Labs  Lab 11/04/23 1619  TSH 1.266  FREET4 1.17*  BNP Recent Labs  Lab 11/04/23 1619  BNP 629.3*    DDimer No results for input(s): "DDIMER" in the last 168 hours.   Radiology    ECHO TEE Result Date: 11/05/2023    TRANSESOPHOGEAL ECHO REPORT   Patient Name:   BIRGIT NOWLING Date of Exam: 11/05/2023 Medical Rec #:  [id number]               Height:       64.0 in Accession #:    [id number]              Weight:       187.0 lb Date of Birth:  Aug 17, 1946               BSA:          1.901 m Patient Age:    78 years                BP:           141/110 mmHg Patient Gender: F                       HR:           126 bpm. Exam Location:  Inpatient Procedure: Cardiac Doppler, Color Doppler and Transesophageal Echo (Both            Spectral and Color Flow Doppler were utilized during procedure). Indications:     Cardioversion  History:         Patient has no prior history of Echocardiogram examinations.  Sonographer:     Harriette Bouillon RDCS Referring Phys:  [id number] CHRISTOPHER RONALD BERGE Diagnosing Phys: Thomasene Ripple DO PROCEDURE: The transesophogeal probe was passed without difficulty through the esophogus of the patient. Sedation performed by different physician. The patient  developed no complications during the procedure.  IMPRESSIONS  1. Left ventricular ejection fraction, by estimation, is 40 to 45%. The left ventricle has mildly decreased function. The left ventricle demonstrates global hypokinesis.  2. Right ventricular systolic function is mildly reduced. The right ventricular size is mildly enlarged.  3. Left atrial size was severely dilated. A left atrial/left atrial appendage thrombus was detected.  4. Small to moderate. The pericardial effusion is circumferential. There is no evidence of cardiac tamponade.  5. The mitral valve is abnormal. Mild to moderate mitral valve regurgitation.  6. The aortic valve is tricuspid. Aortic valve regurgitation is mild to moderate. No aortic stenosis is present.  7. There is borderline dilatation of the ascending aorta, measuring 36 mm. There is mild (Grade II) layered plaque involving the descending aorta. FINDINGS  Left Ventricle: Left ventricular ejection fraction, by estimation, is 40 to 45%. The left ventricle has mildly decreased function. The left ventricle demonstrates global hypokinesis. The left ventricular internal cavity size was normal in size. Right Ventricle: The right ventricular size is mildly enlarged. Right vetricular wall thickness was not well visualized. Right ventricular systolic function is mildly reduced. Left Atrium: Left atrial size was severely dilated. A left atrial/left atrial appendage thrombus was detected. Pericardium: Small to moderate. The pericardial effusion is circumferential. There is no evidence of cardiac tamponade. Mitral Valve: The mitral valve is abnormal. Mild to moderate mitral valve regurgitation. Tricuspid Valve: The tricuspid valve is normal in structure. Tricuspid valve regurgitation is mild. Aortic Valve: The aortic valve is tricuspid. Aortic valve regurgitation is mild to moderate. No aortic stenosis is present. Pulmonic Valve: The pulmonic valve was not well visualized.  Pulmonic valve  regurgitation is trivial. No evidence of pulmonic stenosis. Aorta: There is borderline dilatation of the ascending aorta, measuring 36 mm. There is mild (Grade II) layered plaque involving the descending aorta. Pulmonary Artery: The pulmonary artery is not well seen. Venous: The left upper pulmonary vein, left lower pulmonary vein and right upper pulmonary vein are normal.   AORTA Ao Asc diam: 3.60 cm Lavona [place]ound Tobb DO Electronically signed by Thomasene Ripple DO Signature Date/Time: 11/05/2023/5:08:44 PM    Final    EP STUDY Result Date: 11/05/2023 See surgical note for result.   Cardiac Studies   TEE 11/05/2023  1. Left ventricular ejection fraction, by estimation, is 40 to 45%. The  left ventricle has mildly decreased function. The left ventricle  demonstrates global hypokinesis.   2. Right ventricular systolic function is mildly reduced. The right  ventricular size is mildly enlarged.   3. Left atrial size was severely dilated. A left atrial/left atrial  appendage thrombus was detected.   4. Small to moderate. The pericardial effusion is circumferential. There  is no evidence of cardiac tamponade.   5. The mitral valve is abnormal. Mild to moderate mitral valve  regurgitation.   6. The aortic valve is tricuspid. Aortic valve regurgitation is mild to  moderate. No aortic stenosis is present.   7. There is borderline dilatation of the ascending aorta, measuring 36  mm. There is mild (Grade II) layered plaque involving the descending  aorta.    Patient Profile     78 y.o. female with PMH of polymyalgia rheumatica and temporal arteritis who presented with new onset atrial fibrillation RVR.    Assessment & Plan    New onset atrial fibrillation with RVR  - felt to be triggered by recent cortisone injection of knee. Symptoms included dyspnea, dizziness, palpitation and near syncope  - rate difficult to control on IV diltiazem. Eliquis started  - TEE 3/18 showed EF 40-45%, LAA clot presented,  severely dilated left atrium, mild to moderate MR and AI, small to moderate pericardial effusion. DCCV aborted. Previous echo in 2018 showed EF 60-65%  - IV diltiazem stopped due to low BP, switched to metoprolol succinate and digoxin.   - plan for rate control and TEE DCCV in 3-4 weeks.   - HR poorly controlled despite metoprolol succinate 50mg  BID and digoxin. SBP improved to 120-130s after DC of IV diltiazem. Can consider slowly reintroduce short acting cardizem at 30mg  QID dosing and uptitrate as BP allows  Acute HFrEF  - TEE 3/18 showed EF 40-45%, suspect tachy-mediated cardiomyopathy  Hypertension  Hyperlipidemia Polymyalgia rheumatica Temporal arteritis      For questions or updates, please contact [facility] Please consult [email or web address] for contact info under        Signed, Azalee Course, PA  11/07/2023, 8:22 AM    I have personally seen and examined the patient.  My HPI, Exam, and assessment and plan are below, independent of the NPP above.  Patient notes significant anxiety.  She is worried that her clot will kill her.  She notes that her anxiety between this, work, caring for her husband is substantial.  Notes palpitations and shortness of breath.  No CP.  Exam notable for  Gen: acute distress Neck: No JVD Cardiac: No Rubs or Gallops, no Murmur, IRIR tachycardia,  radial pulses Respiratory: Clear to auscultation bilaterally, normal effort, normal  respiratory rate GI: Soft, nontender, non-distended  MS: No  edema;  moves all extremities Integument: Skin feels war  Neuro:  At time of evaluation, alert and oriented to person/place/time/situation  Psych: Anxious affect, patient feels poorly  Tele: Rate controlled AF when asleep.  RVR 115 when awake; 163 when anxious  In assessment and plan:  AF RVR: possibly related to cortisone injection, was unable to tolerate more succinate, started on digoxin.  She will not take AAD at this time (amiodarone) given the risk  of LAA thrombus/stroke (reasonable).  I have limited options due to the above . Will give low dose diltiazem.  Short term.  Needs close AF f/u. Small to moderate pericardial effusion- no evidence of tamponade; if no improvement in control today will start colchicine tomorrow to see if effusion (which could be the trigger) treatment will improve sx; CRP tomorrow. 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 [place], [address] [place], [place] [address] [phone]  2:02 PM

## 2023-11-07 NOTE — Progress Notes (Signed)
   11/07/23 1556  Spiritual Encounters  Type of Visit Initial  Care provided to: Pt and family  Conversation partners present during encounter Nurse  Reason for visit Routine spiritual support   Reason for Visit: Chaplain responding to Spiritual COnsutl that Pt was experiencing high levels of anxiety and requesting prayer  Time of Visit: 25 Minutes  Description of Visit: Chaplain arrived in room to find Pt lying in bed with her husband at bedside.  Both were pleased to see the chaplain.   Chaplain facilitated story telling and Pt shared her story of a fib diagnosis and following complications.  Several hours after diagnosis Pt began experiencing high levels of anxiety showing out through crying and shaking.   Pt explains that medical team provided an anti-anxiety medication that she credits with helping a great deal.  She reports no longer feeling such high anxiety.  Pt does state that there does remain some anxious feelings, but through processing with her sister and husband she is able to cope with the unknown.  Pt expresses appreciation for chaplain visit and requests prayer.  Plan of Care: No further spiritual care services planned unless Pt calls seeking further intervention.  Chaplain services remain available by Spiritual Consult or for emergent cases, paging [phone]  Chaplain Raelene Bott, MDiv [email or web address] [phone]

## 2023-11-07 NOTE — Anesthesia Postprocedure Evaluation (Signed)
 Anesthesia Post Note  Patient: Michelle Wilcox  Procedure(s) Performed: TRANSESOPHAGEAL ECHOCARDIOGRAM CARDIOVERSION     Patient location during evaluation: Cath Lab Anesthesia Type: MAC Level of consciousness: awake and alert Pain management: pain level controlled Vital Signs Assessment: post-procedure vital signs reviewed and stable Respiratory status: spontaneous breathing, nonlabored ventilation and respiratory function stable Cardiovascular status: stable and blood pressure returned to baseline Postop Assessment: no apparent nausea or vomiting Anesthetic complications: no   No notable events documented.                Sair Faulcon

## 2023-11-08 DIAGNOSIS — I480 Paroxysmal atrial fibrillation: Secondary | ICD-10-CM | POA: Diagnosis not present

## 2023-11-08 DIAGNOSIS — I4891 Unspecified atrial fibrillation: Secondary | ICD-10-CM | POA: Diagnosis not present

## 2023-11-08 LAB — CBC
HCT: 37.8 % (ref 36.0–46.0)
Hemoglobin: 12.7 g/dL (ref 12.0–15.0)
MCH: 32.7 pg (ref 26.0–34.0)
MCHC: 33.6 g/dL (ref 30.0–36.0)
MCV: 97.4 fL (ref 80.0–100.0)
Platelets: 323 10*3/uL (ref 150–400)
RBC: 3.88 MIL/uL (ref 3.87–5.11)
RDW: 14.8 % (ref 11.5–15.5)
WBC: 17.7 10*3/uL — ABNORMAL HIGH (ref 4.0–10.5)
nRBC: 0 % (ref 0.0–0.2)

## 2023-11-08 LAB — BASIC METABOLIC PANEL
Anion gap: 10 (ref 5–15)
BUN: 17 mg/dL (ref 8–23)
CO2: 23 mmol/L (ref 22–32)
Calcium: 7.7 mg/dL — ABNORMAL LOW (ref 8.9–10.3)
Chloride: 104 mmol/L (ref 98–111)
Creatinine, Ser: 0.79 mg/dL (ref 0.44–1.00)
GFR, Estimated: >60 mL/min (ref >60)
Glucose, Bld: 106 mg/dL — ABNORMAL HIGH (ref 70–99)
Potassium: 3.4 mmol/L — ABNORMAL LOW (ref 3.5–5.1)
Sodium: 137 mmol/L (ref 135–145)

## 2023-11-08 LAB — MAGNESIUM: Magnesium: 1.8 mg/dL (ref 1.7–2.4)

## 2023-11-08 LAB — C-REACTIVE PROTEIN: CRP: 3.9 mg/dL — ABNORMAL HIGH (ref <1.0)

## 2023-11-08 MED ORDER — POTASSIUM CHLORIDE CRYS ER 20 MEQ PO TBCR
40.0000 meq | EXTENDED_RELEASE_TABLET | Freq: Once | ORAL | Status: AC
  Administered 2023-11-08: 40 meq via ORAL
  Filled 2023-11-08: qty 2

## 2023-11-08 MED ORDER — SODIUM CHLORIDE 0.9 % IV BOLUS
500.0000 mL | Freq: Once | INTRAVENOUS | Status: AC
  Administered 2023-11-08: 500 mL via INTRAVENOUS

## 2023-11-08 MED ORDER — MAGNESIUM SULFATE 2 GM/50ML IV SOLN
2.0000 g | Freq: Once | INTRAVENOUS | Status: AC
  Administered 2023-11-08: 2 g via INTRAVENOUS
  Filled 2023-11-08: qty 50

## 2023-11-08 MED ORDER — ACETAMINOPHEN 500 MG PO TABS
1000.0000 mg | ORAL_TABLET | Freq: Two times a day (BID) | ORAL | Status: DC | PRN
  Administered 2023-11-08 (×2): 1000 mg via ORAL
  Filled 2023-11-08 (×2): qty 2

## 2023-11-08 MED ORDER — AMIODARONE LOAD VIA INFUSION
150.0000 mg | Freq: Once | INTRAVENOUS | Status: AC
  Administered 2023-11-08: 150 mg via INTRAVENOUS
  Filled 2023-11-08: qty 83.34

## 2023-11-08 MED ORDER — ACETAMINOPHEN 325 MG PO TABS
650.0000 mg | ORAL_TABLET | Freq: Four times a day (QID) | ORAL | Status: DC | PRN
  Administered 2023-11-08: 650 mg via ORAL
  Filled 2023-11-08: qty 2

## 2023-11-08 MED ORDER — DICLOFENAC SODIUM 1 % EX GEL
2.0000 g | Freq: Four times a day (QID) | CUTANEOUS | Status: DC | PRN
  Administered 2023-11-09 – 2023-11-10 (×4): 2 g via TOPICAL
  Filled 2023-11-08: qty 100

## 2023-11-08 MED ORDER — AMIODARONE HCL IN DEXTROSE 360-4.14 MG/200ML-% IV SOLN
60.0000 mg/h | INTRAVENOUS | Status: AC
  Administered 2023-11-08: 60 mg/h via INTRAVENOUS
  Filled 2023-11-08: qty 200

## 2023-11-08 MED ORDER — GABAPENTIN 100 MG PO CAPS
200.0000 mg | ORAL_CAPSULE | Freq: Two times a day (BID) | ORAL | Status: DC
  Administered 2023-11-08 – 2023-11-11 (×7): 200 mg via ORAL
  Filled 2023-11-08 (×7): qty 2

## 2023-11-08 MED ORDER — AMIODARONE HCL IN DEXTROSE 360-4.14 MG/200ML-% IV SOLN
30.0000 mg/h | INTRAVENOUS | Status: DC
  Administered 2023-11-08 – 2023-11-09 (×3): 30 mg/h via INTRAVENOUS
  Filled 2023-11-08 (×5): qty 200

## 2023-11-08 NOTE — Progress Notes (Addendum)
 PROGRESS NOTE Michelle Wilcox  LKG:[id number] DOB: 12-Jan-1946 DOA: 11/04/2023 PCP: Gweneth Dimitri, MD  Brief Narrative/Hospital Course: 78 y.o. female with history of polymyalgia rheumatica, temporal arteritis, autoimmune hemolytic anemia status post splenectomy, diastolic dysfunction, hypertension has been feeling weak tired and fatigued and occasional fluttering sensation in her chest over the last 2 weeks since she got cortisone shot for her right knee.  Due to the symptoms patient presented to her primary care physician and at primary care physician's office patient was found to be tachycardic and in A-fib and was referred to the ER.  She was subsequently hospitalized for further management.  Patient underwent cardiology consultation and TEE on 8/18 EF 40-45%, had left atrial appendage and DCCV was deferred> managed with diuretics and transition to p.o. Lasix and Eliquis, needed cardizem drip for new onset A-fib.  Consultants: Cardiology   Procedures: TEE 3/18:EF 40-45%,LA appendageal thrombus so DCCV was deferred and kept on Eliquis.   Subjective: Seen and examined this morning Overnight patient complaining of arthritic discomfort on her bilateral feet-given Tylenol Voltaren gel This morning BP soft 70s and rapid response was called patient appeared pale,given 500 cc bolus NS Labs potassium low 3.40 stable renal function CBC with leukocytosis Heart rate ok in a fib  Assessment and Plan: Principal Problem:   Atrial fibrillation (HCC) Active Problems:   Autoimmune hemolytic anemia (HCC)   Essential hypertension   Diastolic dysfunction without heart failure   Temporal arteritis (HCC)   PMR (polymyalgia rheumatica) (HCC)   Atrial fibrillation with rapid ventricular response (HCC)   Borderline abnormal TFTs   Macrocytosis   Pericardial effusion   New onset atrial fibrillation Left atrial appendage thrombus: Initial  HR -150s likely triggered by cortisone injection most  likely.  S/P TEE/DCCV 3/15 has LAD thrombus so DCCV deferrd, TEE showed EF 40-45%. Controlled currently.  Due to hypotension overnight medication will be limited for a fib-patient agreed for amiodarone initially declined due to risk of clot dislodgement.  Continue metoprolol as BP tolerates.   TEE/DCCV 3 to 4 weeks as OP. On Eliquis currently.  Continue plan per cardiology-informed by cardiology that patient has agreed for Amiodrone.  Acute HFrEF: EF 40-55% TEE-cardiomyopathy likely in the setting of new onset A-fib.  CT negative for PE but small pleural effusion. This morning given IV fluids for hypotension, GDMT limited 2/2 hypotension, diuretics on hold  Hypertension hx: Hypotension 3/21 am-rapid response: To 90s after 500 cc bolus this morning.  Meds for A-fib will be limited due to soft BP see above  Polymyalgia rheumatica-Temporal arteritis Chronic arthritis and pain Autoimmune hemolytic anemia S/P splenectomy Leukocytosis likely from chronic steroid use: Patient on chronic steroid low-dose,continue the same.  She recent received cortisone injection as OP.Continue Voltaren gel Tylenol for pain management.  Patient is afebrile although leukocytosis.  Open to trying Neurontin, increase Tylenol to 1000 mg every 12 hours as needed.  Abnormal thyroid test: TSH normal but free T4 mildly elevated likely sick euthyroid will need outpatient follow-up with repeat thyroid function test in 3 to 4 weeks.  Class I Obesity: Patient's Body mass index is 32.1 kg/m. : Will benefit with PCP follow-up, weight loss  healthy lifestyle and outpatient sleep evaluation.  Anxiety:  Significant component.Continue Xanax as needed.  Overall prognosis remains guarded at this time.  Continue to monitor closely she remains full code  DVT prophylaxis: Eliquis Code Status:   Code Status: Full Code Family Communication: plan of care discussed with patient/ at bedside. Patient status is:  Remains hospitalized  because of severity of illness Level of care: Telemetry Cardiac   Dispo: The patient is from: Home            Anticipated disposition: Needs PT OT eval once hemodynamics stable  Objective: Vitals last 24 hrs: Vitals:   11/08/23 0750 11/08/23 0752 11/08/23 0803 11/08/23 1109  BP: (!) 75/60 (!) 90/52 (!) 94/54 (!) 118/91  Pulse:    (!) 150  Resp:      Temp:      TempSrc:      SpO2:      Weight:      Height:       Weight change:   Physical Examination: General exam: alert awake, anxious HEENT:Oral mucosa moist, Ear/Nose WNL grossly Respiratory system: Bilaterally clear BS,no use of accessory muscle Cardiovascular system: S1 & S2 +, irregularly irregular, no JVD. Gastrointestinal system: Abdomen soft,NT,ND, BS+ Nervous System: Alert, awake, moving all extremities,and following commands. Extremities: LE edema neg,distal peripheral pulses palpable and warm.  Skin: No rashes,no icterus. MSK: Normal muscle bulk,tone, power   Medications reviewed:  Scheduled Meds:  apixaban  5 mg Oral BID   digoxin  0.125 mg Oral Daily   gabapentin  200 mg Oral BID   loratadine  10 mg Oral Daily   metoprolol succinate  50 mg Oral BID   potassium chloride  40 mEq Oral Once   predniSONE  5 mg Oral Q breakfast   rosuvastatin  5 mg Oral Q48H   Continuous Infusions:  magnesium sulfate bolus IVPB      Diet Order             Diet Heart Room service appropriate? Yes; Fluid consistency: Thin  Diet effective now                  Intake/Output Summary (Last 24 hours) at 11/08/2023 1152 Last data filed at 11/08/2023 0356 Gross per 24 hour  Intake 474 ml  Output 2150 ml  Net -1676 ml   Net IO Since Admission: -1,430.62 mL [11/08/23 1152]  Wt Readings from Last 3 Encounters:  11/04/23 84.8 kg  05/20/23 82.9 kg  04/09/23 83.6 kg     Unresulted Labs (From admission, onward)     Start     Ordered   11/09/23 0500  Magnesium  Tomorrow morning,   R       Question:  Specimen collection  method  Answer:  Lab=Lab collect   11/08/23 1147   11/08/23 1137  Magnesium  Add-on,   AD       Question:  Specimen collection method  Answer:  Lab=Lab collect   11/08/23 1137   11/07/23 0500  Basic metabolic panel  Daily,   R     Question:  Specimen collection method  Answer:  Lab=Lab collect   11/06/23 1423   11/05/23 0500  CBC  Daily,   R      11/04/23 2146          Data Reviewed: I have personally reviewed following labs and imaging studies CBC: Recent Labs  Lab 11/05/23 0341 11/05/23 0456 11/06/23 0353 11/07/23 0830 11/08/23 0500  WBC 12.8* 12.8* 13.1* 13.2* 17.7*  NEUTROABS  --  8.6*  --   --   --   HGB 12.1 12.4 11.7* 13.5 12.7  HCT 37.0 37.7 36.1 41.8 37.8  MCV 98.7 99.5 99.4 99.3 97.4  PLT 312 309 289 350 323   Basic Metabolic Panel:  Recent Labs  Lab 11/04/23  1619 11/05/23 0456 11/06/23 0353 11/07/23 0830 11/08/23 0500  NA 137 140 139 139 137  K 4.5 3.5 4.2 3.4* 3.4*  CL 100 105 107 103 104  CO2 28 24 25 24 23   GLUCOSE 116* 96 128* 95 106*  BUN 17 12 9 14 17   CREATININE 0.84 0.77 0.77 0.83 0.79  CALCIUM 8.8* 8.3* 7.8* 8.5* 7.7*  MG  --  1.9 2.0  --   --    GFR: Estimated Creatinine Clearance: 61 mL/min (by C-G formula based on SCr of 0.79 mg/dL). Liver Function Tests:  Recent Labs  Lab 11/05/23 0456 11/06/23 0353  AST 39 29  ALT 39 28  ALKPHOS 57 50  BILITOT 0.7 0.4  PROT 5.7* 5.4*  ALBUMIN 3.2* 3.0*   No results for input(s): "TSH", "T4TOTAL", "FREET4", "T3FREE", "THYROIDAB" in the last 72 hours.  Sepsis Labs: No results for input(s): "PROCALCITON", "LATICACIDVEN" in the last 168 hours. No results found for this or any previous visit (from the past 240 hours).  Antimicrobials/Microbiology: Anti-infectives (From admission, onward)    None      No results found for: "SDES", "SPECREQUEST", "CULT", "REPTSTATUS"   Radiology Studies: No results found.    LOS: 3 days   Total time spent in review of labs and imaging, patient  evaluation, formulation of plan, documentation and communication with family: 50 minutes  Lanae Boast, MD  [facility]  11/08/2023, 11:52 AM

## 2023-11-08 NOTE — Significant Event (Signed)
 Rapid Response Event Note   Reason for Call :  "No BP" called by secretary  Initial Focused Assessment:  Patient alert in recliner with complaints to feet, staff begin assisting patient to bed in supine position. Skin warm/dry. Patient denies CP, palpitations, dizziness; again only complaints at this time are sharp/shooting pain on top and bottom of feet unrelieved by tylenol given overnight.   72/51 (59) HR 82-flutter  RR 22 O2 94% RA  Interventions/Plan of Care:  MD to bedside  1L NS bolus per MD May adjust medications? 94/54 (66)  Event Summary:  MD Notified: Harrell Lark PA Call Time: [phone]  Arrival Time: [phone] End Time: 0800  Truddie Crumble, RN

## 2023-11-08 NOTE — Progress Notes (Addendum)
 Present during rapid response, patient did not complain any feeling of passing out. She keep complaining of sharp pain in bilateral feet. (Arthritic vs reperfusion pain?) Nurse attempting to place another peripheral IV in left arm. 1L IV fluid running wide open. SBP came up to 94 mmHg. Will obtain morning lab (delayed as patient did not want to get stuck in the morning) and EKG.   Patient is on PRN xanax for anxiety, but no pain med. Last dose of Xanax 4:09AM. Will hold off on giving her pain med until her SBP come up more.

## 2023-11-08 NOTE — Care Management Important Message (Signed)
 Important Message  Patient Details  Name: Michelle Wilcox MRN: [id number] Date of Birth: 1945/09/28   Important Message Given:  Yes - Medicare IM     Michelle Wilcox 11/08/2023, 10:45 AM

## 2023-11-08 NOTE — Plan of Care (Signed)
  Problem: Clinical Measurements: Goal: Will remain free from infection Outcome: Progressing Goal: Respiratory complications will improve Outcome: Progressing Goal: Cardiovascular complication will be avoided Outcome: Progressing   Problem: Activity: Goal: Risk for activity intolerance will decrease Outcome: Progressing   Problem: Nutrition: Goal: Adequate nutrition will be maintained Outcome: Progressing   Problem: Pain Managment: Goal: General experience of comfort will improve and/or be controlled Outcome: Progressing   Problem: Safety: Goal: Ability to remain free from injury will improve Outcome: Progressing

## 2023-11-08 NOTE — Plan of Care (Signed)
   Problem: Activity: Goal: Risk for activity intolerance will decrease Outcome: Progressing   Problem: Nutrition: Goal: Adequate nutrition will be maintained Outcome: Progressing   Problem: Coping: Goal: Level of anxiety will decrease Outcome: Progressing   Problem: Elimination: Goal: Will not experience complications related to bowel motility Outcome: Progressing

## 2023-11-08 NOTE — Progress Notes (Addendum)
 [facility] night cross cover note:   I was notified by RN that the patient is complaining of chronic arthritic discomfort in the bilateral feet, with the patient conveying that she typically takes as needed acetaminophen for this at home.  I subsequently ordered prn acetaminophen for her.   Update: patient notes that the bilateral foot discomfort has been refractory to prn acetaminophen and requests resumption of home Voltaren gel, which I subsequently ordered for her.    Newton Pigg, DO Hospitalist

## 2023-11-08 NOTE — Final Progress Note (Signed)
 Pt sitting on the recliner, c/o leg pain, appears pale. BP checked at SBP 65/66, 72/51 and manually checked at mid 70's. Notified MD. RRT activated.

## 2023-11-08 NOTE — Progress Notes (Addendum)
 Rounding Note    Patient Name: Michelle Wilcox Date of Encounter: 11/08/2023  [facility] Cardiologist: Bryan Lemma, MD   Subjective   Denies any CP or SOB. Patient seen during rapid response secondary to BP drop  Inpatient Medications    Scheduled Meds:  apixaban  5 mg Oral BID   digoxin  0.125 mg Oral Daily   loratadine  10 mg Oral Daily   metoprolol succinate  50 mg Oral BID   predniSONE  5 mg Oral Q breakfast   rosuvastatin  5 mg Oral Q48H   Continuous Infusions:  magnesium sulfate bolus IVPB     PRN Meds: acetaminophen, ALPRAZolam, diclofenac Sodium, mouth rinse, senna-docusate   Vital Signs    Vitals:   11/08/23 0737 11/08/23 0743 11/08/23 0750 11/08/23 0752  BP: (!) 72/51 (!) 83/45 (!) 75/60 (!) 90/52  Pulse:      Resp:  (!) 22    Temp:      TempSrc:      SpO2:  94%    Weight:      Height:        Intake/Output Summary (Last 24 hours) at 11/08/2023 0824 Last data filed at 11/08/2023 0356 Gross per 24 hour  Intake 832 ml  Output 2150 ml  Net -1318 ml      11/04/2023    4:25 PM 05/20/2023    9:28 AM 04/09/2023    1:37 PM  Last 3 Weights  Weight (lbs) 187 lb 182 lb 12.8 oz 184 lb 6.4 oz  Weight (kg) 84.823 kg 82.918 kg 83.643 kg      Telemetry    Atrial fibrillation/flutter with HR 80s - Personally Reviewed  ECG    Atrial fibrillation with HR 100s - Personally Reviewed  Physical Exam   GEN: No acute distress.   Neck: No JVD Cardiac: irregularly irregular, no murmurs, rubs, or gallops.  Respiratory: Clear to auscultation bilaterally. GI: Soft, nontender, non-distended  MS: No edema; No deformity. Neuro:  Nonfocal  Psych: Normal affect   Labs    High Sensitivity Troponin:   Recent Labs  Lab 11/04/23 1619 11/04/23 1847  TROPONINIHS 14 15     Chemistry Recent Labs  Lab 11/05/23 0456 11/06/23 0353 11/07/23 0830  NA 140 139 139  K 3.5 4.2 3.4*  CL 105 107 103  CO2 24 25 24   GLUCOSE 96 128* 95  BUN 12 9  14   CREATININE 0.77 0.77 0.83  CALCIUM 8.3* 7.8* 8.5*  MG 1.9 2.0  --   PROT 5.7* 5.4*  --   ALBUMIN 3.2* 3.0*  --   AST 39 29  --   ALT 39 28  --   ALKPHOS 57 50  --   BILITOT 0.7 0.4  --   GFRNONAA >60 >60 >60  ANIONGAP 11 7 12     Lipids No results for input(s): "CHOL", "TRIG", "HDL", "LABVLDL", "LDLCALC", "CHOLHDL" in the last 168 hours.  Hematology Recent Labs  Lab 11/05/23 0456 11/06/23 0353 11/07/23 0830  WBC 12.8* 13.1* 13.2*  RBC 3.79* 3.63* 4.21  HGB 12.4 11.7* 13.5  HCT 37.7 36.1 41.8  MCV 99.5 99.4 99.3  MCH 32.7 32.2 32.1  MCHC 32.9 32.4 32.3  RDW 14.9 14.9 14.9  PLT 309 289 350   Thyroid  Recent Labs  Lab 11/04/23 1619  TSH 1.266  FREET4 1.17*    BNP Recent Labs  Lab 11/04/23 1619  BNP 629.3*    DDimer No results for input(s): "  DDIMER" in the last 168 hours.   Radiology    No results found.  Cardiac Studies   TEE 11/05/2023 1. Left ventricular ejection fraction, by estimation, is 40 to 45%. The  left ventricle has mildly decreased function. The left ventricle  demonstrates global hypokinesis.   2. Right ventricular systolic function is mildly reduced. The right  ventricular size is mildly enlarged.   3. Left atrial size was severely dilated. A left atrial/left atrial  appendage thrombus was detected.   4. Small to moderate. The pericardial effusion is circumferential. There  is no evidence of cardiac tamponade.   5. The mitral valve is abnormal. Mild to moderate mitral valve  regurgitation.   6. The aortic valve is tricuspid. Aortic valve regurgitation is mild to  moderate. No aortic stenosis is present.   7. There is borderline dilatation of the ascending aorta, measuring 36  mm. There is mild (Grade II) layered plaque involving the descending  aorta.   Patient Profile     78 y.o. female with PMH of polymyalgia rheumatica and temporal arteritis who presented with new onset atrial fibrillation RVR.    Assessment & Plan    New  onset atrial fibrillation with RVR             - felt to be triggered by recent cortisone injection of knee. Symptoms included dyspnea, dizziness, palpitation and near syncope             - rate difficult to control on IV diltiazem. Eliquis started             - TEE 3/18 showed EF 40-45%, LAA clot present, severely dilated left atrium, mild to moderate MR and AI, small to moderate pericardial effusion. DCCV aborted. Previous echo in 2018 showed EF 60-65%             - IV diltiazem stopped due to low BP, switched to metoprolol succinate and digoxin.              - plan for rate control and TEE DCCV in 3-4 weeks.              - added 60 QID short acting diltiazem yesterday in attempt for better rate control. HR overnight was 80s. Sat up in the sofa next to bed this morning when nurse found she was pale and checked a BP, patient denies dizziness or feeling of passing out, complains of significant foot pain bilaterally started around 3AM. Given 500cc IV fluid. SBP improved to 98 mmHg. Discussed with MD, unfortunately the only other medication that can effectively control HR is amiodarone which she wish to avoid given risk of chemical cardioversion with the LAA thrombus and its distal embolization risk.    Acute HFrEF             - TEE 3/18 showed EF 40-45%, suspect tachy-mediated cardiomyopathy   Hypertension   Hyperlipidemia  Feet pain: bilateral, started around 3AM this morning. Bounding 2+ dorsalis pedis pulse. Suspicion for embolic event fairly low.   Polymyalgia rheumatica  Temporal arteritis      For questions or updates, please contact [facility] Please consult [email or web address] for contact info under        Signed, Azalee Course, PA  11/08/2023, 8:24 AM    I have personally seen and examined the patient.  My HPI, Exam, and assessment and plan are below, independent of the NPP above.  Rapid response this AM:.  The patient, with atrial fibrillation  and heart failure, presents  with refractory atrial fibrillation and severe foot pain. She has a history of atrial fibrillation with heart failure and mild reduced ejection fraction. Despite treatment with metoprolol, 50 mg in the morning and 50 mg at night, she continues to experience refractory atrial fibrillation with a heart rate of 150 bpm. Her condition is complicated by a clot in the distal left atrial appendage, as revealed by a transesophageal echocardiogram, and she remains anticoagulated. The persistent atrial fibrillation has been difficult to manage and limits the use of certain medications like diltiazem due to her hypotensive state and reduced ejection fraction. This morning, she experienced a significant drop in blood pressure to SBP 64 mmHg, prompting immediate medical attention. She was in severe pain when attempting to stand, leading to the initiation of IV fluids. Her hypotension is likely exacerbated by her current medications and pain management, which limits the use of certain medications like metoprolol. She woke up at 3:30 AM with severe foot pain, initially attributed to arthritis. She normally takes two arthritis strength pills twice a day but had not taken any since Monday morning. She received Tylenol from [place], but it did not alleviate the pain. Cold compresses, hot compresses, and massage were also ineffective. The pain is severe enough to cause distress and may be contributing to her elevated heart rate.   Exam notable for  Gen: mild distress  Neck: No JVD Cardiac: No Rubs or Gallops, no Murmur, IRIR tachycardia; +2 DP and PT pulses Respiratory: Clear to auscultation bilaterally,  normal effort,   normal respiratory rate GI: Soft, nontender, non-distended  MS: No  edema;  moves all extremities Integument: Skin feels warm Neuro:  At time of evaluation, alert and oriented to person/place/time/situation  Psych: Poor affect, patient feels poorly  Tele: RVR  Atrial Fibrillation with Heart Failure  Refractory atrial fibrillation with heart failure and mildly reduced ejection fraction. Persistent and challenging to manage. Currently on metoprolol but experiencing hypotension and symptoms with a heart rate of 150 bpm. Diltiazem use limited due to heart failure and reduced ejection fraction. Anticoagulated with a transesophageal echocardiogram showing a clot in the distal left atrial appendage, reducing immediate embolization risk. Pain management may reduce heart rate. IV amiodarone considered if current management fails. Risks include potential stroke, with embolectomy as a possible intervention if stroke occurs. Decision to use IV amiodarone due to lack of other options and refractory nature of atrial fibrillation.  - Administer IV amiodarone  - Continue anticoagulation therapy (discussed switching to heparin in the event thrombectoy is needed but low PT/PTT may increase risk of clot stability  - Monitor hemodynamic status closely. - Consider pain management to potentially reduce heart rate.   Hypotension Hypotension complicating atrial fibrillation management and limiting use of medications like metoprolol and diltiazem. May be exacerbated by pain and medications used for its management.  - wean to just digoxin and metoprolol; no further diuresis  Care Coordination Called her Shari Heritage; Kathlene November: [phone] Called her husband: Jovonna Nickell [phone] Discussed high risk medication and risk of stroke with patient, family, [facility] teammates and Pharm D; will proceed   Riley Lam, MD FASE [facility] Cardiologist [facility]  [address] [place], [address] [place], [place] [address] [phone]  12:58 PM

## 2023-11-09 DIAGNOSIS — I480 Paroxysmal atrial fibrillation: Secondary | ICD-10-CM | POA: Diagnosis not present

## 2023-11-09 DIAGNOSIS — I4891 Unspecified atrial fibrillation: Secondary | ICD-10-CM | POA: Diagnosis not present

## 2023-11-09 LAB — CBC
HCT: 48.3 % — ABNORMAL HIGH (ref 36.0–46.0)
Hemoglobin: 15.6 g/dL — ABNORMAL HIGH (ref 12.0–15.0)
MCH: 32.8 pg (ref 26.0–34.0)
MCHC: 32.3 g/dL (ref 30.0–36.0)
MCV: 101.5 fL — ABNORMAL HIGH (ref 80.0–100.0)
Platelets: 338 10*3/uL (ref 150–400)
RBC: 4.76 MIL/uL (ref 3.87–5.11)
RDW: 15.2 % (ref 11.5–15.5)
WBC: 14.8 10*3/uL — ABNORMAL HIGH (ref 4.0–10.5)
nRBC: 0 % (ref 0.0–0.2)

## 2023-11-09 LAB — BASIC METABOLIC PANEL
Anion gap: 11 (ref 5–15)
BUN: 13 mg/dL (ref 8–23)
CO2: 20 mmol/L — ABNORMAL LOW (ref 22–32)
Calcium: 8.3 mg/dL — ABNORMAL LOW (ref 8.9–10.3)
Chloride: 105 mmol/L (ref 98–111)
Creatinine, Ser: 0.75 mg/dL (ref 0.44–1.00)
GFR, Estimated: >60 mL/min (ref >60)
Glucose, Bld: 89 mg/dL (ref 70–99)
Potassium: 4.6 mmol/L (ref 3.5–5.1)
Sodium: 136 mmol/L (ref 135–145)

## 2023-11-09 LAB — MAGNESIUM: Magnesium: 2.4 mg/dL (ref 1.7–2.4)

## 2023-11-09 MED ORDER — ACETAMINOPHEN 500 MG PO TABS
1000.0000 mg | ORAL_TABLET | Freq: Three times a day (TID) | ORAL | Status: DC | PRN
  Administered 2023-11-09 – 2023-11-11 (×6): 1000 mg via ORAL
  Filled 2023-11-09 (×7): qty 2

## 2023-11-09 NOTE — Progress Notes (Signed)
   Patient Name: Michelle Wilcox Date of Encounter: 11/09/2023 [facility] Cardiologist: Bryan Lemma, MD   Interval Summary  .    Bilateral foot pain has improved.  Hypotension has improved.  No chest pain.  Still with A-fib.  Slightly better rate control.  Vital Signs .    Vitals:   11/09/23 0104 11/09/23 0400 11/09/23 0500 11/09/23 0530  BP:   114/85 114/85  Pulse: 73 (!) 52 (!) 105 70  Resp:   16   Temp:   98.4 F (36.9 C)   TempSrc:   Oral   SpO2:  95% 92% 97%  Weight:      Height:        Intake/Output Summary (Last 24 hours) at 11/09/2023 1045 Last data filed at 11/09/2023 0530 Gross per 24 hour  Intake 452.3 ml  Output 500 ml  Net -47.7 ml      11/04/2023    4:25 PM 05/20/2023    9:28 AM 04/09/2023    1:37 PM  Last 3 Weights  Weight (lbs) 187 lb 182 lb 12.8 oz 184 lb 6.4 oz  Weight (kg) 84.823 kg 82.918 kg 83.643 kg      Telemetry/ECG    Atrial fibrillation mostly in the 110 range- Personally Reviewed  Physical Exam .   GEN: No acute distress.   Neck: No JVD Cardiac: Irregularly irregular mildly tachycardic, no murmurs, rubs, or gallops.  Respiratory: Clear to auscultation bilaterally. GI: Soft, nontender, non-distended  MS: No edema  Assessment & Plan .     New onset atrial fibrillation with left atrial appendage thrombus seen on TEE on 11/02/2023. -DC cardioversion aborted at this time secondary to thrombus -EF 40 to 45% -Started amiodarone on 3/21 with continuation of digoxin Eliquis metoprolol Aubery Lapping own should be short-term.  This is only for maintenance of rate control -May be a degree of tachycardia mediated cardiomyopathy. -IV diltiazem stopped secondary to hypotension switched to metoprolol as well as digoxin.  Required rapid response and fluid bolus -Lets keep the IV amiodarone going again today with hopeful switch to p.o. tomorrow.  If her rate is under reasonable control with on average less than 110 bpm we could consider  discharge.  Temporal arteritis - On chronic steroid use.  Risk factor for atrial fibrillation.  Bilateral foot pain - Improved with gabapentin.  Good distal blood flow.  Unsure of etiology.  She is concerned that this may hinder her discharge.  For questions or updates, please contact [facility] Please consult [email or web address] for contact info under        Signed, Donato Schultz, MD

## 2023-11-09 NOTE — Plan of Care (Signed)

## 2023-11-09 NOTE — Progress Notes (Signed)
 PROGRESS NOTE Michelle Wilcox  ZOX:[id number] DOB: [birth year] DOA: 11/04/2023 PCP: Gweneth Dimitri, MD  Brief Narrative/Hospital Course: 78 y.o. female with history of polymyalgia rheumatica, temporal arteritis, autoimmune hemolytic anemia status post splenectomy, diastolic dysfunction, hypertension has been feeling weak tired and fatigued and occasional fluttering sensation in her chest over the last 2 weeks since she got cortisone shot for her right knee.  Due to the symptoms patient presented to her primary care physician and at primary care physician's office patient was found to be tachycardic and in A-fib and was referred to the ER.  She was subsequently hospitalized for further management.  Patient underwent cardiology consultation and TEE on 8/18 EF 40-45%, had left atrial appendage and DCCV was deferred> managed with diuretics and transition to p.o. Lasix and Eliquis, needed cardizem drip for new onset A-fib.  Consultants: Cardiology   Procedures: TEE 3/18:EF 40-45%,LA appendageal thrombus so DCCV was deferred and kept on Eliquis.   Subjective: seen this am No chest pain nausea vomiting Feet pain better today more on heel Overnight afebrile BP stable Remains in A-fib rate fairly controlled On amiodarone drip since 3/21 Requesting to increase her tylenol to tid  Assessment and Plan: Principal Problem:   Atrial fibrillation (HCC) Active Problems:   Autoimmune hemolytic anemia (HCC)   Essential hypertension   Diastolic dysfunction without heart failure   Temporal arteritis (HCC)   PMR (polymyalgia rheumatica) (HCC)   Atrial fibrillation with rapid ventricular response (HCC)   Borderline abnormal TFTs   Macrocytosis   Pericardial effusion   New onset atrial fibrillation Left atrial appendage thrombus: Initial  HR -150s likely triggered by cortisone injection >S/P TEE 3/15 showed-LA appendegeal thrombus- DCCV deferrd, TEE showed EF 40-45%.  Patient agreed for amiodarone  3/21- Continue digoxin, Eliquis, metoprolol as per cardiology monitor in telemetry HR remains in A-fib.  Acute HFrEF: EF 40-55% TEE- likely in the setting of new onset A-fib. CTA negative for PE but small pleural effusion. Lasix on hold due to hypotension,needing fluids   Hypertension hx: Hypotension 3/21 am-rapid response-s/p 500 cc bolus NS: BP currently stable  Polymyalgia rheumatica-Temporal arteritis Chronic arthritis and pain Autoimmune hemolytic anemia S/P splenectomy Leukocytosis likely from chronic steroid use: on chronic steroid low-dose.She recent received cortisone injection as OP. Continue Voltaren gel Tylenol for pain management and started on Neurontin 3/21, cont home Tylenol 1000 mg-patient request to increase to tid.increase and monitor lfts   Abnormal thyroid test: TSH normal but free T4 mildly elevated likely sick euthyroid will need outpatient follow-up with repeat thyroid function test in 3 to 4 weeks.  Class I Obesity: Patient's Body mass index is 32.1 kg/m. : Will benefit with PCP follow-up, weight loss  healthy lifestyle and outpatient sleep evaluation.  Anxiety:  Significant component.Continue Xanax PRN  Overall prognosis remains guarded at this time.  Continue to monitor closely she remains full code  DVT prophylaxis: Eliquis Code Status:   Code Status: Full Code Family Communication: plan of care discussed with patient/ at bedside. Patient status is: Remains hospitalized because of severity of illness Level of care: Telemetry Cardiac   Dispo: The patient is from: Home            Anticipated disposition: Needs PT OT eval once hemodynamics stable  Objective: Vitals last 24 hrs: Vitals:   11/09/23 0104 11/09/23 0400 11/09/23 0500 11/09/23 0530  BP:   114/85 114/85  Pulse: 73 (!) 52 (!) 105 70  Resp:   16   Temp:   98.4  F (36.9 C)   TempSrc:   Oral   SpO2:  95% 92% 97%  Weight:      Height:       Weight change:   Physical  Examination: General exam: alert awake, oriented HEENT:Oral mucosa moist, Ear/Nose WNL grossly Respiratory system: Bilaterally clear BS,no use of accessory muscle Cardiovascular system: S1 & S2 +,irregular No JVD. Gastrointestinal system: Abdomen soft,NT,ND, BS+ Nervous System: Alert, awake, moving all extremities,and following commands. Extremities: LE edema neg,distal peripheral pulses palpable and warm.  Skin: No rashes,no icterus. MSK: Normal muscle bulk,tone, power   Medications reviewed:  Scheduled Meds:  apixaban  5 mg Oral BID   digoxin  0.125 mg Oral Daily   gabapentin  200 mg Oral BID   loratadine  10 mg Oral Daily   metoprolol succinate  50 mg Oral BID   predniSONE  5 mg Oral Q breakfast   rosuvastatin  5 mg Oral Q48H   Continuous Infusions:  amiodarone 30 mg/hr (11/09/23 0842)    Diet Order             Diet Heart Room service appropriate? Yes; Fluid consistency: Thin  Diet effective now                  Intake/Output Summary (Last 24 hours) at 11/09/2023 1012 Last data filed at 11/09/2023 0530 Gross per 24 hour  Intake 452.3 ml  Output 500 ml  Net -47.7 ml   Net IO Since Admission: -1,478.32 mL [11/09/23 1012]  Wt Readings from Last 3 Encounters:  11/04/23 84.8 kg  05/20/23 82.9 kg  04/09/23 83.6 kg     Unresulted Labs (From admission, onward)     Start     Ordered   11/07/23 0500  Basic metabolic panel  Daily,   R     Question:  Specimen collection method  Answer:  Lab=Lab collect   11/06/23 1423   11/05/23 0500  CBC  Daily,   R      11/04/23 2146          Data Reviewed: I have personally reviewed following labs and imaging studies CBC: Recent Labs  Lab 11/05/23 0456 11/06/23 0353 11/07/23 0830 11/08/23 0500 11/09/23 0818  WBC 12.8* 13.1* 13.2* 17.7* 14.8*  NEUTROABS 8.6*  --   --   --   --   HGB 12.4 11.7* 13.5 12.7 15.6*  HCT 37.7 36.1 41.8 37.8 48.3*  MCV 99.5 99.4 99.3 97.4 101.5*  PLT 309 289 350 323 338   Basic Metabolic  Panel:  Recent Labs  Lab 11/05/23 0456 11/06/23 0353 11/07/23 0830 11/08/23 0500 11/09/23 0818  NA 140 139 139 137 136  K 3.5 4.2 3.4* 3.4* 4.6  CL 105 107 103 104 105  CO2 24 25 24 23  20*  GLUCOSE 96 128* 95 106* 89  BUN 12 9 14 17 13   CREATININE 0.77 0.77 0.83 0.79 0.75  CALCIUM 8.3* 7.8* 8.5* 7.7* 8.3*  MG 1.9 2.0  --  1.8 2.4   GFR: Estimated Creatinine Clearance: 61 mL/min (by C-G formula based on SCr of 0.75 mg/dL). Liver Function Tests:  Recent Labs  Lab 11/05/23 0456 11/06/23 0353  AST 39 29  ALT 39 28  ALKPHOS 57 50  BILITOT 0.7 0.4  PROT 5.7* 5.4*  ALBUMIN 3.2* 3.0*   No results for input(s): "TSH", "T4TOTAL", "FREET4", "T3FREE", "THYROIDAB" in the last 72 hours.  Sepsis Labs: No results for input(s): "PROCALCITON", "LATICACIDVEN" in the last 168 hours.  No results found for this or any previous visit (from the past 240 hours).  Antimicrobials/Microbiology: Anti-infectives (From admission, onward)    None      No results found for: "SDES", "SPECREQUEST", "CULT", "REPTSTATUS"   Radiology Studies: No results found.    LOS: 4 days   Total time spent in review of labs and imaging, patient evaluation, formulation of plan, documentation and communication with family: 35 minutes  Lanae Boast, MD  [facility]  11/09/2023, 10:12 AM

## 2023-11-10 DIAGNOSIS — I480 Paroxysmal atrial fibrillation: Secondary | ICD-10-CM | POA: Diagnosis not present

## 2023-11-10 LAB — BASIC METABOLIC PANEL
Anion gap: 9 (ref 5–15)
BUN: 19 mg/dL (ref 8–23)
CO2: 25 mmol/L (ref 22–32)
Calcium: 8.2 mg/dL — ABNORMAL LOW (ref 8.9–10.3)
Chloride: 105 mmol/L (ref 98–111)
Creatinine, Ser: 0.73 mg/dL (ref 0.44–1.00)
GFR, Estimated: >60 mL/min (ref >60)
Glucose, Bld: 132 mg/dL — ABNORMAL HIGH (ref 70–99)
Potassium: 3.7 mmol/L (ref 3.5–5.1)
Sodium: 139 mmol/L (ref 135–145)

## 2023-11-10 LAB — CBC
HCT: 40.4 % (ref 36.0–46.0)
Hemoglobin: 13.5 g/dL (ref 12.0–15.0)
MCH: 32.7 pg (ref 26.0–34.0)
MCHC: 33.4 g/dL (ref 30.0–36.0)
MCV: 97.8 fL (ref 80.0–100.0)
Platelets: 323 10*3/uL (ref 150–400)
RBC: 4.13 MIL/uL (ref 3.87–5.11)
RDW: 14.7 % (ref 11.5–15.5)
WBC: 10.4 10*3/uL (ref 4.0–10.5)
nRBC: 0 % (ref 0.0–0.2)

## 2023-11-10 MED ORDER — AMIODARONE HCL 200 MG PO TABS
400.0000 mg | ORAL_TABLET | Freq: Two times a day (BID) | ORAL | Status: DC
  Administered 2023-11-10 – 2023-11-11 (×3): 400 mg via ORAL
  Filled 2023-11-10 (×3): qty 2

## 2023-11-10 NOTE — Progress Notes (Signed)
   Patient Name: Michelle Wilcox Date of Encounter: 11/10/2023 [facility] Cardiologist: Bryan Lemma, MD   Interval Summary  .    A-fib overall under reasonable control.  I have reviewed telemetry personally and her heart rates on average definitely less than 110.  They have increased at times up to the 130s but she is asymptomatic with this.  Had bowel movement this morning.  No chest pain.  Vital Signs .    Vitals:   11/09/23 1607 11/09/23 2020 11/09/23 2100 11/10/23 0309  BP: (!) 113/93 105/72  129/80  Pulse: 89 (!) 140 79 73  Resp: 16 16  16   Temp: 98.6 F (37 C) 98.6 F (37 C)  97.9 F (36.6 C)  TempSrc: Oral Oral  Oral  SpO2: 99% 95% 95% 100%  Weight:      Height:        Intake/Output Summary (Last 24 hours) at 11/10/2023 0920 Last data filed at 11/10/2023 0500 Gross per 24 hour  Intake 870.52 ml  Output 700 ml  Net 170.52 ml      11/04/2023    4:25 PM 05/20/2023    9:28 AM 04/09/2023    1:37 PM  Last 3 Weights  Weight (lbs) 187 lb 182 lb 12.8 oz 184 lb 6.4 oz  Weight (kg) 84.823 kg 82.918 kg 83.643 kg      Telemetry/ECG    As above - Personally Reviewed  Physical Exam .   Alert and oriented x 3, no JVD, no significant edema, normal respiratory effort  Assessment & Plan .     78 year old with new onset atrial fibrillation with left atrial appendage thrombus seen on transesophageal echocardiogram on 11/02/2023. - DC cardioversion was aborted at that time secondary to LAA thrombus. - Ejection fraction 40 to 45%-possibly tachycardia mediated - Started on amiodarone IV on 3/21 with continuation of digoxin, Eliquis, metoprolol 50 mg twice a day.  Amiodarone should be short-term.  This is only for maintenance of rate control.  Blood pressure has been an issue. - IV diltiazem previously stopped because of hypotension.  At 1 point required rapid response for fluid bolus. - Last night nursing staff noted heart rates fluctuating from  110-130. Asymptomatic.  Looked on telemetry.  On average certainly less than 110. - We will transition to p.o. amiodarone 400 mg twice a day for 5 days then 200 mg twice a day for 2 weeks then 200 mg a day thereafter.  Temporal arteritis - On chronic steroid use.  Steroid use is a risk factor for atrial fibrillation.  Bilateral foot pain - Improved with gabapentin.  Has good distal blood flow with palpable pulses.  Unsure of etiology of this pain, could be neuropathic.  She was worried that this may hinder her discharge if she is not able to control this.  Hopeful DC soon.  I am comfortable from a cardiology perspective with this.  May have other rate limiting factors however. For questions or updates, please contact [facility] Please consult [email or web address] for contact info under        Signed, Donato Schultz, MD

## 2023-11-10 NOTE — Progress Notes (Addendum)
 Physical Therapy Evaluation Patient Details Name: Michelle Wilcox MRN: [id number] DOB: 06-12-1946 Today's Date: 11/10/2023  History of Present Illness  78 y.o. female presents to [facility] 11/04/23 from PCP due to having tachycardia and a-fib w/ RVR. 3/18 TEE with L atrial appendage thrombus found, DCCV aborted 2/2 thrombus. Pt also with acute HFrEF and small pleural effusion. Pmhx: polymyalgia rheumatica, temporal arteritis, autoimmune hemolytic anemia s/p splenectomy, diastolic dysfunction, HTN   Clinical Impression  Pt in bed upon arrival and agreeable to PT eval. PTA, pt was ModI with a SP cane for mobility. Pt had a recent fall due to tripping over a grocery bag on the floor. In today's session, pt was ModI for bed mobility. Pt initially needed MinA to stand from the edge of the bed and lower toilet surface. With increased repetition, pt progressed to needing supervision while using BUE to push from the surface. She was also able to ambulate ~286ft with supervision and RW. Pt currently with functional limitations due to the deficits listed below (see PT Problem List). Pt would benefit from acute skilled PT to address functional impairments. Recommending post-acute OP PT to work towards independence with mobility. Acute PT to follow.    Variable HR during session from 95-130 BPM      If plan is discharge home, recommend the following: A little help with walking and/or transfers;Assist for transportation;Help with stairs or ramp for entrance   Can travel by private vehicle    Yes    Equipment Recommendations Rolling walker (2 wheels);BSC/3in1     Functional Status Assessment Patient has had a recent decline in their functional status and demonstrates the ability to make significant improvements in function in a reasonable and predictable amount of time.     Precautions / Restrictions Precautions Precautions: Fall Precaution/Restrictions Comments: watch HR Restrictions Weight Bearing  Restrictions Per Provider Order: No      Mobility  Bed Mobility Overal bed mobility: Modified Independent    General bed mobility comments: HOB elevated    Transfers Overall transfer level: Needs assistance Equipment used: Rolling walker (2 wheels) Transfers: Sit to/from Stand Sit to Stand: Min assist, Supervision    General transfer comment: Initially needed MinA from EOB and lower toilet surface. Progressed to supervision with increased repetition and use of B UE to push from surface    Ambulation/Gait Ambulation/Gait assistance: Supervision Gait Distance (Feet): 200 Feet Assistive device: Rolling walker (2 wheels) Gait Pattern/deviations: Step-through pattern, Decreased step length - left, Decreased step length - right Gait velocity: decr     General Gait Details: shortened step length with slow and steady gait    Balance Overall balance assessment: Needs assistance, Mild deficits observed, not formally tested, History of Falls Sitting-balance support: No upper extremity supported, Feet supported Sitting balance-Leahy Scale: Good     Standing balance support: Bilateral upper extremity supported, During functional activity, Reliant on assistive device for balance Standing balance-Leahy Scale: Fair Standing balance comment: able to stand w/ no UE support, RW for gait       Pertinent Vitals/Pain Pain Assessment Pain Assessment: Faces Faces Pain Scale: Hurts even more Pain Location: L elbow Pain Descriptors / Indicators: Aching, Discomfort Pain Intervention(s): Limited activity within patient's tolerance, Monitored during session, Repositioned    Home Living Family/patient expects to be discharged to:: Private residence Living Arrangements: Spouse/significant other Available Help at Discharge: Family;Friend(s);Available PRN/intermittently Type of Home: House Home Access: Level entry       Home Layout: Two level;Able to live on  main level with  bedroom/bathroom Home Equipment: Grab bars - tub/shower;Other (comment);Cane - single point Additional Comments: helps husband with medication management and cooking. Sleeps in recliner due to SOB    Prior Function Prior Level of Function : Independent/Modified Independent  Mobility Comments: ModI with SP cane, tripped and fell over a grocery bag ADLs Comments: Ind     Extremity/Trunk Assessment   Upper Extremity Assessment Upper Extremity Assessment: Overall WFL for tasks assessed    Lower Extremity Assessment Lower Extremity Assessment: Generalized weakness (reports having pain in B feet, however, no pain present on eval. Alert to light touch)    Cervical / Trunk Assessment Cervical / Trunk Assessment: Normal  Communication   Communication Communication: No apparent difficulties    Cognition Arousal: Alert Behavior During Therapy: WFL for tasks assessed/performed   PT - Cognitive impairments: No apparent impairments   Following commands: Intact       Cueing Cueing Techniques: Verbal cues, Tactile cues      PT Assessment Patient needs continued PT services  PT Problem List Decreased activity tolerance;Decreased balance;Decreased mobility;Decreased strength       PT Treatment Interventions DME instruction;Functional mobility training;Therapeutic activities;Therapeutic exercise;Balance training;Neuromuscular re-education;Patient/family education;Gait training    PT Goals (Current goals can be found in the Care Plan section)  Acute Rehab PT Goals Patient Stated Goal: to get stronger PT Goal Formulation: With patient Time For Goal Achievement: 11/24/23 Potential to Achieve Goals: Good    Frequency Min 2X/week        AM-PAC PT "6 Clicks" Mobility  Outcome Measure Help needed turning from your back to your side while in a flat bed without using bedrails?: None Help needed moving from lying on your back to sitting on the side of a flat bed without using  bedrails?: None Help needed moving to and from a bed to a chair (including a wheelchair)?: A Little Help needed standing up from a chair using your arms (e.g., wheelchair or bedside chair)?: A Little Help needed to walk in hospital room?: A Little Help needed climbing 3-5 steps with a railing? : A Little 6 Click Score: 20    End of Session Equipment Utilized During Treatment: Gait belt Activity Tolerance: Patient tolerated treatment well Patient left: in bed;with call bell/phone within reach Nurse Communication: Mobility status;Other (comment) (HR) PT Visit Diagnosis: Unsteadiness on feet (R26.81);Other abnormalities of gait and mobility (R26.89);History of falling (Z91.81)    Time: 4098-1191 PT Time Calculation (min) (ACUTE ONLY): 37 min   Charges:   PT Evaluation $PT Eval Low Complexity: 1 Low PT Treatments $Gait Training: 8-22 mins PT General Charges $$ ACUTE PT VISIT: 1 Visit        Hilton Cork, PT, DPT Secure Chat Preferred  Rehab Office [phone]   Arturo Morton Brion Aliment 11/10/2023, 1:58 PM

## 2023-11-10 NOTE — Progress Notes (Signed)
 PROGRESS NOTE Michelle Wilcox  ZOX:[id number] DOB: [birth year] DOA: 11/04/2023 PCP: Gweneth Dimitri, MD  Brief Narrative/Hospital Course: 78 y.o. female with history of polymyalgia rheumatica, temporal arteritis, autoimmune hemolytic anemia status post splenectomy, diastolic dysfunction, hypertension has been feeling weak tired and fatigued and occasional fluttering sensation in her chest over the last 2 weeks since she got cortisone shot for her right knee.  Due to the symptoms patient presented to her primary care physician and at primary care physician's office patient was found to be tachycardic and in A-fib and was referred to the ER.  She was subsequently hospitalized for further management.  Patient underwent cardiology consultation and TEE on 8/18 EF 40-45%, had left atrial appendage and DCCV was deferred> managed with diuretics and transition to p.o. Lasix and Eliquis, needed cardizem drip for new onset A-fib. Initially patient declined amiodarone but subsequently agreed on 3/21   Consultants: Cardiology   Procedures: TEE 3/18:EF 40-45%,LA appendageal thrombus so DCCV was deferred and kept on Eliquis.   Subjective: SEEN THIS AM FEET LESS PAINFUL IV INFILTRATED ON LEFT UPPER ARM Heart rate is reasonably controlled in A-fib  Otherwise no issues  Not ready for home yet she says- had a rough night  Assessment and Plan: Principal Problem:   Atrial fibrillation (HCC) Active Problems:   Autoimmune hemolytic anemia (HCC)   Essential hypertension   Diastolic dysfunction without heart failure   Temporal arteritis (HCC)   PMR (polymyalgia rheumatica) (HCC)   Atrial fibrillation with RVR (HCC)   Borderline abnormal TFTs   Macrocytosis   Pericardial effusion   New onset atrial fibrillation Left atrial appendage thrombus: Initial  HR -150s likely triggered by cortisone injection >S/P TEE 3/15 showed-LA appendegeal thrombus- DCCV deferrd, TEE showed EF 40-45%.  Patient agreed for  amiodarone infusion 3/21-changing to oral amiodarone 400 twice daily x 5 days then 200 x 2 weeks then 200 daily thereafter.  Discharged with cardiology this morning we will plan to ambulate and monitor heart rate.   Continue digoxin, Eliquis, metoprolol as ordered She does not feel ready for home today  Acute HFrEF: EF 40-55% TEE- likely in the setting of new onset A-fib. CTA negative for PE but small pleural effusion.  Hypertension hx: Hypotension 3/21 am-rapid response-s/p 500 cc bolus NS: BP stable  Polymyalgia rheumatica-Temporal arteritis Chronic arthritis and pain Autoimmune hemolytic anemia S/P splenectomy Leukocytosis likely from chronic steroid use: Continue her home chronic steroids low-dose.   She recent received cortisone injection as OP. Continue Voltaren gel Tylenol for pain management- PTA on 1300 mg bid- here 1 gm tid prn, started on Neurontin 3/21  Abnormal thyroid test: TSH normal but free T4 mildly elevated likely sick euthyroid will need outpatient follow-up with repeat thyroid function test in 3 to 4 weeks.  Class I Obesity: Patient's Body mass index is 32.1 kg/m. : Will benefit with PCP follow-up, weight loss  healthy lifestyle and outpatient sleep evaluation.  Anxiety:  Significant component.Continue Xanax PRN  Overall prognosis remains guarded at this time.  Continue to monitor closely she remains full code  DVT prophylaxis: Eliquis Code Status:   Code Status: Full Code Family Communication: plan of care discussed with patient/ at bedside. Patient status is: Remains hospitalized because of severity of illness Level of care: Telemetry Cardiac   Dispo: The patient is from: Home            Anticipated disposition: Hopefully home tomorrow   Objective: Vitals last 24 hrs: Vitals:   11/09/23 2020 11/09/23  2100 11/10/23 0309 11/10/23 0929  BP: 105/72  129/80 119/83  Pulse: (!) 140 79 73 (!) 101  Resp: 16  16   Temp: 98.6 F (37 C)  97.9 F (36.6 C)    TempSrc: Oral  Oral   SpO2: 95% 95% 100%   Weight:      Height:       Weight change:   Physical Examination: General exam: alert awake, oriented  HEENT:Oral mucosa moist, Ear/Nose WNL grossly Respiratory system: Bilaterally clear BS,no use of accessory muscle Cardiovascular system: S1 & S2 +, irregular,No JVD. Gastrointestinal system: Abdomen soft,NT,ND, BS+ Nervous System: Alert, awake, moving all extremities,and following commands. Extremities: LE edema neg,distal peripheral pulses palpable and warm.  Skin: No rashes,no icterus. MSK: Normal muscle bulk,tone, power   Medications reviewed:  Scheduled Meds:  amiodarone  400 mg Oral BID   apixaban  5 mg Oral BID   digoxin  0.125 mg Oral Daily   gabapentin  200 mg Oral BID   loratadine  10 mg Oral Daily   metoprolol succinate  50 mg Oral BID   predniSONE  5 mg Oral Q breakfast   rosuvastatin  5 mg Oral Q48H   Continuous Infusions:    Diet Order             Diet Heart Room service appropriate? Yes; Fluid consistency: Thin  Diet effective now                  Intake/Output Summary (Last 24 hours) at 11/10/2023 1051 Last data filed at 11/10/2023 1000 Gross per 24 hour  Intake 1010.52 ml  Output 870 ml  Net 140.52 ml   Net IO Since Admission: -1,097.8 mL [11/10/23 1051]  Wt Readings from Last 3 Encounters:  11/04/23 84.8 kg  05/20/23 82.9 kg  04/09/23 83.6 kg     Unresulted Labs (From admission, onward)     Start     Ordered   11/07/23 0500  Basic metabolic panel  Daily,   R     Question:  Specimen collection method  Answer:  Lab=Lab collect   11/06/23 1423   11/05/23 0500  CBC  Daily,   R      11/04/23 2146          Data Reviewed: I have personally reviewed following labs and imaging studies CBC: Recent Labs  Lab 11/05/23 0456 11/06/23 0353 11/07/23 0830 11/08/23 0500 11/09/23 0818 11/10/23 0844  WBC 12.8* 13.1* 13.2* 17.7* 14.8* 10.4  NEUTROABS 8.6*  --   --   --   --   --   HGB 12.4 11.7*  13.5 12.7 15.6* 13.5  HCT 37.7 36.1 41.8 37.8 48.3* 40.4  MCV 99.5 99.4 99.3 97.4 101.5* 97.8  PLT 309 289 350 323 338 323   Basic Metabolic Panel:  Recent Labs  Lab 11/05/23 0456 11/06/23 0353 11/07/23 0830 11/08/23 0500 11/09/23 0818 11/10/23 0844  NA 140 139 139 137 136 139  K 3.5 4.2 3.4* 3.4* 4.6 3.7  CL 105 107 103 104 105 105  CO2 24 25 24 23  20* 25  GLUCOSE 96 128* 95 106* 89 132*  BUN 12 9 14 17 13 19   CREATININE 0.77 0.77 0.83 0.79 0.75 0.73  CALCIUM 8.3* 7.8* 8.5* 7.7* 8.3* 8.2*  MG 1.9 2.0  --  1.8 2.4  --    GFR: Estimated Creatinine Clearance: 61 mL/min (by C-G formula based on SCr of 0.73 mg/dL). Liver Function Tests:  Recent Labs  Lab 11/05/23 0456 11/06/23 0353  AST 39 29  ALT 39 28  ALKPHOS 57 50  BILITOT 0.7 0.4  PROT 5.7* 5.4*  ALBUMIN 3.2* 3.0*   No results for input(s): "TSH", "T4TOTAL", "FREET4", "T3FREE", "THYROIDAB" in the last 72 hours.  Sepsis Labs: No results for input(s): "PROCALCITON", "LATICACIDVEN" in the last 168 hours. No results found for this or any previous visit (from the past 240 hours).  Antimicrobials/Microbiology: Anti-infectives (From admission, onward)    None      No results found for: "SDES", "SPECREQUEST", "CULT", "REPTSTATUS"   Radiology Studies: No results found.    LOS: 5 days   Total time spent in review of labs and imaging, patient evaluation, formulation of plan, documentation and communication with family: 35 minutes  Lanae Boast, MD  [facility]  11/10/2023, 10:51 AM

## 2023-11-10 NOTE — Plan of Care (Signed)

## 2023-11-11 ENCOUNTER — Other Ambulatory Visit (HOSPITAL_COMMUNITY): Payer: Self-pay

## 2023-11-11 ENCOUNTER — Ambulatory Visit

## 2023-11-11 DIAGNOSIS — I5021 Acute systolic (congestive) heart failure: Secondary | ICD-10-CM | POA: Diagnosis not present

## 2023-11-11 DIAGNOSIS — I4891 Unspecified atrial fibrillation: Secondary | ICD-10-CM | POA: Diagnosis not present

## 2023-11-11 DIAGNOSIS — I480 Paroxysmal atrial fibrillation: Secondary | ICD-10-CM | POA: Diagnosis not present

## 2023-11-11 MED ORDER — METOPROLOL SUCCINATE ER 50 MG PO TB24
50.0000 mg | ORAL_TABLET | Freq: Two times a day (BID) | ORAL | 0 refills | Status: DC
  Filled 2023-11-11: qty 60, 30d supply, fill #0

## 2023-11-11 MED ORDER — APIXABAN 5 MG PO TABS
5.0000 mg | ORAL_TABLET | Freq: Two times a day (BID) | ORAL | 0 refills | Status: DC
Start: 2023-11-11 — End: 2023-11-19
  Filled 2023-11-11: qty 60, 30d supply, fill #0

## 2023-11-11 MED ORDER — DIGOXIN 125 MCG PO TABS
0.1250 mg | ORAL_TABLET | Freq: Every day | ORAL | 0 refills | Status: DC
  Filled 2023-11-11: qty 30, 30d supply, fill #0

## 2023-11-11 MED ORDER — GABAPENTIN 100 MG PO CAPS
200.0000 mg | ORAL_CAPSULE | Freq: Two times a day (BID) | ORAL | 0 refills | Status: DC
  Filled 2023-11-11: qty 120, 30d supply, fill #0

## 2023-11-11 MED ORDER — AMIODARONE HCL 200 MG PO TABS
ORAL_TABLET | ORAL | 0 refills | Status: DC
  Filled 2023-11-11: qty 90, 67d supply, fill #0

## 2023-11-11 NOTE — Progress Notes (Signed)
 Cardiology Progress Note  Patient ID: Michelle Wilcox MRN: [id number] DOB: May 09, 1946 Date of Encounter: 11/11/2023 Primary Cardiologist: Bryan Lemma, MD  Subjective   Chief Complaint: Weakness.  HPI: Heart rates fairly controlled.  Reports she is weak and fatigued.  She tells me she cannot go back to work.  ROS:  All other ROS reviewed and negative. Pertinent positives noted in the HPI.     Telemetry  Overnight telemetry shows A-fib 90 to 110 bpm, which I personally reviewed.    Physical Exam   Vitals:   11/11/23 0700 11/11/23 0800 11/11/23 0817 11/11/23 0941  BP:      Pulse: 77 68 (!) 32 (!) 104  Resp:      Temp:      TempSrc:      SpO2: 93% 95% (!) 85%   Weight:      Height:        Intake/Output Summary (Last 24 hours) at 11/11/2023 1043 Last data filed at 11/11/2023 0750 Gross per 24 hour  Intake 600 ml  Output --  Net 600 ml       11/04/2023    4:25 PM 05/20/2023    9:28 AM 04/09/2023    1:37 PM  Last 3 Weights  Weight (lbs) 187 lb 182 lb 12.8 oz 184 lb 6.4 oz  Weight (kg) 84.823 kg 82.918 kg 83.643 kg    Body mass index is 32.1 kg/m.  General: Well nourished, well developed, in no acute distress Head: Atraumatic, normal size  Eyes: PEERLA, EOMI  Neck: Supple, no JVD Endocrine: No thryomegaly Cardiac: Normal S1, S2; irregular rhythm, no murmurs Lungs: Clear to auscultation bilaterally, no wheezing, rhonchi or rales  Abd: Soft, nontender, no hepatomegaly  Ext: No edema, pulses 2+ Musculoskeletal: No deformities, BUE and BLE strength normal and equal Skin: Warm and dry, no rashes   Neuro: Alert and oriented to person, place, time, and situation, CNII-XII grossly intact, no focal deficits  Psych: Normal mood and affect   Cardiac Studies  TEE 11/05/2023  1. Left ventricular ejection fraction, by estimation, is 40 to 45%. The  left ventricle has mildly decreased function. The left ventricle  demonstrates global hypokinesis.   2. Right  ventricular systolic function is mildly reduced. The right  ventricular size is mildly enlarged.   3. Left atrial size was severely dilated. A left atrial/left atrial  appendage thrombus was detected.   4. Small to moderate. The pericardial effusion is circumferential. There  is no evidence of cardiac tamponade.   5. The mitral valve is abnormal. Mild to moderate mitral valve  regurgitation.   6. The aortic valve is tricuspid. Aortic valve regurgitation is mild to  moderate. No aortic stenosis is present.   7. There is borderline dilatation of the ascending aorta, measuring 36  mm. There is mild (Grade II) layered plaque involving the descending  aorta.   Patient Profile  Michelle Wilcox is a 78 y.o. female with polymyalgia rheumatica, temporal arteritis, autoimmune hemolytic anemia status post frenectomy, hypertension admitted on 11/04/2023 for atrial fibrillation with RVR.  Course complicated by left atrial appendage thrombus.  Assessment & Plan   # Atrial fibrillation with RVR # Left atrial appendage thrombus -Transesophageal echo with concern for left atrial appendage thrombus.  Cardioversion was deferred. -Would recommend rate control strategy for at least 6 to 8 weeks and then can reattempt TEE/cardioversion. -Plan is for amiodarone 40 mg twice daily for 5 days followed by 200 mg twice daily for 2  weeks followed by 200 mg daily thereafter.  This plan was decided by cardiology over the weekend. -We continue metoprolol succinate 50 mg twice daily. -Continue digoxin 0.125 mg daily. -Continue Eliquis. -We will arrange outpatient follow-up in 1 to 2 weeks.  # Acute systolic heart failure, EF 40-45% -Likely tachycardia mediated.  She is not on GDMT as blood pressures have been a bit soft.  Plan is for discharge today.  Further titration of medical therapy as an outpatient.  # Polymyalgia rheumatica # Temporal arteritis -On chronic steroid therapy.  [facility]  will sign off.   Medication Recommendations: As above Other recommendations (labs, testing, etc): None Follow up as an outpatient: 1 to 2 weeks with Dr. Herbie Baltimore  For questions or updates, please contact [facility] Please consult [email or web address] for contact info under      Signed, Gerri Spore T. Flora Lipps, MD, [facility] [place]  [facility]  11/11/2023 10:43 AM

## 2023-11-11 NOTE — TOC Transition Note (Signed)
 Transition of Care [facility]) - Discharge Note   Patient Details  Name: Michelle Wilcox MRN: [id number] Date of Birth: [birth year]  [facility]) CM/SW Contact:  Gala Lewandowsky, RN Phone Number: 11/11/2023, 11:35 AM   Clinical Narrative: Patient will transition home today. Ambulatory referral submitted to [facility] for outpatient PT services. The office will call the patient with visit times. Case Manager ordered DME bedside commode and rolling walker via [employer]. DME to be delivered to the room. Spouse to transport home via private vehicle. No further needs identified at this time.     Final next level of care: Home/Self Care Barriers to Discharge: No Barriers Identified  Patient Goals and CMS Choice Patient states their goals for this hospitalization and ongoing recovery are:: plan is to return home once stable   Choice offered to / list presented to : NA   Discharge Plan and Services Additional resources added to the After Visit Summary for     Discharge Planning Services: CM Consult            DME Arranged: Dan Humphreys rolling, Bedside commode DME Agency: [employer] Date DME Agency Contacted: 11/11/23 Time DME Agency Contacted: [phone] Representative spoke with at DME Agency: Vaughan Basta  Social Drivers of Health (SDOH) Interventions SDOH Screenings   Food Insecurity: No Food Insecurity (11/04/2023)  Housing: Low Risk  (11/04/2023)  Transportation Needs: No Transportation Needs (11/04/2023)  Utilities: Not At Risk (11/04/2023)  Social Connections: Socially Isolated (11/04/2023)  Tobacco Use: Low Risk  (11/05/2023)   Readmission Risk Interventions     No data to display

## 2023-11-11 NOTE — Plan of Care (Signed)

## 2023-11-11 NOTE — Discharge Summary (Signed)
 Physician Discharge Summary  Michelle Wilcox WUJ:[id number] DOB: Jan 17, 1946 DOA: 11/04/2023  PCP: Gweneth Dimitri, MD  Admit date: 11/04/2023 Discharge date: 11/11/2023 Recommendations for Outpatient Follow-up:  Follow up with PCP in 1 weeks-call for appointment Please obtain BMP/CBC in one week  Discharge Dispo: Home Discharge Condition: Stable Code Status:   Code Status: Full Code Diet recommendation:  Diet Order             Diet Heart Room service appropriate? Yes; Fluid consistency: Thin  Diet effective now                    Brief/Interim Summary: 78 y.o. female with history of polymyalgia rheumatica, temporal arteritis, autoimmune hemolytic anemia status post splenectomy, diastolic dysfunction, hypertension has been feeling weak tired and fatigued and occasional fluttering sensation in her chest over the last 2 weeks since she got cortisone shot for her right knee.  Due to the symptoms patient presented to her primary care physician and at primary care physician's office patient was found to be tachycardic and in A-fib and was referred to the ER.  She was subsequently hospitalized for further management.  Patient underwent cardiology consultation and TEE on 8/18 EF 40-45%, had left atrial appendage and DCCV was deferred> managed with diuretics and transition to p.o. Lasix and Eliquis, needed cardizem drip for new onset A-fib. Initially patient declined amiodarone but subsequently agreed on 3/21, got loaded with IV amiodarone and transitioned to p.o. 3/23.  Consultants: Cardiology   Procedures: TEE 3/18:EF 40-45%,LA appendageal thrombus so DCCV was deferred and kept on Eliquis.  Discharge diagnoses:  New onset atrial fibrillation Left atrial appendage thrombus: Initial  HR -150s likely triggered by cortisone injection >S/P TEE 3/15 showed-LA appendegeal thrombus- DCCV deferrd, TEE showed EF 40-45%.  Patient agreed for amiodarone infusion 3/21-changed  to oral  amiodarone 400 twice daily x 5 days then 200mg  bid x 2 weeks then 200 daily thereafter.  Rate overall doing well on ambulation only goes up to 130 transient.  At this time she is asymptomatic.  Discussed with cardiology and okay for discharge home Cont dogoxin, Eliquis, metoprolol    Acute HFrEF: EF 40-55% TEE- likely in the setting of new onset A-fib. CTA negative for PE but small pleural effusion.   Hypertension hx: Hypotension 3/21 am-rapid response-s/p 500 cc bolus NS: BP stable and tolerating meds for a-fib.   Polymyalgia rheumatica-Temporal arteritis Chronic arthritis and pain Autoimmune hemolytic anemia S/P splenectomy Leukocytosis likely from chronic steroid use: Received cortisone injection as outpatient. Continue her home chronic steroids low-dose.   Continue Voltaren gel Tylenol for pain management- PTA on 1300 mg bid- here 1 gm tid prn,cont neurontin 3/21   Abnormal thyroid test: TSH normal but free T4 mildly elevated likely sick euthyroid will need outpatient follow-up with repeat thyroid function test in 3 to 4 weeks.   Class I Obesity: Patient's Body mass index is 32.1 kg/m. : Will benefit with PCP follow-up, weight loss  healthy lifestyle and outpatient sleep evaluation.   Anxiety:  Significant component.Continue Xanax PRN   Subjective:  Aaox3 moved in hallway and doing well   Discharge Exam: Vitals:   11/11/23 0817 11/11/23 0941  BP:    Pulse: (!) 32 (!) 104  Resp:    Temp:    SpO2: (!) 85%    General: Pt is alert, awake, not in acute distress Cardiovascular: RRR, S1/S2 +, no rubs, no gallops Respiratory: CTA bilaterally, no wheezing, no rhonchi Abdominal: Soft, NT, ND,  bowel sounds + Extremities: no edema, no cyanosis  Discharge Instructions  Discharge Instructions     (HEART FAILURE PATIENTS) Call MD:  Anytime you have any of the following symptoms: 1) 3 pound weight gain in 24 hours or 5 pounds in 1 week 2) shortness of breath, with or without a  dry hacking cough 3) swelling in the hands, feet or stomach 4) if you have to sleep on extra pillows at night in order to breathe.   Complete by: As directed    Ambulatory referral to Physical Therapy   Complete by: As directed    Outpatient Physical Therapy for evaluation and treatment.   Discharge instructions   Complete by: As directed    Please call call MD or return to ER for similar or worsening recurring problem that brought you to hospital or if any fever,nausea/vomiting,abdominal pain, uncontrolled pain, chest pain,  shortness of breath or any other alarming symptoms.  Please follow-up your doctor as instructed in a week time and call the office for appointment.  Please avoid alcohol, smoking, or any other illicit substance and maintain healthy habits including taking your regular medications as prescribed.  You were cared for by a hospitalist during your hospital stay. If you have any questions about your discharge medications or the care you received while you were in the hospital after you are discharged, you can call the unit and ask to speak with the hospitalist on call if the hospitalist that took care of you is not available.  Once you are discharged, your primary care physician will handle any further medical issues. Please note that NO REFILLS for any discharge medications will be authorized once you are discharged, as it is imperative that you return to your primary care physician (or establish a relationship with a primary care physician if you do not have one) for your aftercare needs so that they can reassess your need for medications and monitor your lab values   Increase activity slowly   Complete by: As directed       Allergies as of 11/11/2023       Reactions   Penicillins Anaphylaxis   Almond (diagnostic) Hives   Ceftin [cefuroxime Axetil] Hives, Other (See Comments)   headaches   Ciprofloxacin Hives, Other (See Comments)   headaches   Darvocet [propoxyphene  N-acetaminophen] Other (See Comments)   headaches   Lactose Intolerance (gi) Diarrhea   Levaquin [levofloxacin In D5w] Hives   Sulfa Antibiotics Hives   Sulfamethoxazole-trimethoprim Other (See Comments)   Unknown   Zyrtec [cetirizine Hcl] Other (See Comments)   headache   Other Other (See Comments)   Powder in gloves - Rash MSG - Hives Swiss Cheese - Hives        Medication List     STOP taking these medications    ibandronate 150 MG tablet Commonly known as: BONIVA   metoprolol tartrate 50 MG tablet Commonly known as: LOPRESSOR       TAKE these medications    acetaminophen 650 MG CR tablet Commonly known as: TYLENOL Take 1,300 mg by mouth 2 (two) times daily.   Align 4 MG Caps Take 4 mg by mouth every morning.   amiodarone 200 MG tablet Commonly known as: PACERONE Take 2 tablets (400 mg total) by mouth 2 (two) times daily for 3 days, THEN 1 tablet (200 mg total) 2 (two) times daily for 14 days, THEN 1 tablet (200 mg total) daily. Start taking on: November 11, 2023  bisacodyl 5 MG EC tablet Commonly known as: DULCOLAX Take 10 mg by mouth daily as needed for moderate constipation.   digoxin 0.125 MG tablet Commonly known as: LANOXIN Take 1 tablet (0.125 mg total) by mouth daily. Start taking on: November 12, 2023   diphenhydrAMINE 25 MG tablet Commonly known as: BENADRYL Take 25-50 mg by mouth daily as needed for allergies.   Eliquis 5 MG Tabs tablet Generic drug: apixaban Take 1 tablet (5 mg total) by mouth 2 (two) times daily.   folic acid 1 MG tablet Commonly known as: FOLVITE TAKE 1 TABLET(1 MG) BY MOUTH DAILY   furosemide 40 MG tablet Commonly known as: LASIX Take 20 mg by mouth every evening.   gabapentin 100 MG capsule Commonly known as: NEURONTIN Take 2 capsules (200 mg total) by mouth 2 (two) times daily.   lactase 3000 units tablet Commonly known as: LACTAID Take 9,000-15,000 Units by mouth as needed (consuming dairy products).    loperamide 2 MG tablet Commonly known as: IMODIUM A-D Take 2-4 mg by mouth as needed for diarrhea or loose stools (takes 2 if needed).   loratadine 10 MG tablet Commonly known as: CLARITIN Take 10 mg by mouth daily.   metoprolol succinate 50 MG 24 hr tablet Commonly known as: TOPROL-XL Take 1 tablet (50 mg total) by mouth 2 (two) times daily. Take with or immediately following a meal.   multivitamin tablet Take 1 tablet by mouth every evening.   ONDANSETRON HCL PO Take 1 tablet by mouth as needed (nausea).   Potassium Chloride ER 20 MEQ Tbcr Take 0.5 tablets by mouth daily.   predniSONE 5 MG tablet Commonly known as: DELTASONE Take 1 tablet (5 mg total) by mouth daily with breakfast.   rosuvastatin 5 MG tablet Commonly known as: CRESTOR Take 1 tablet by mouth every other day.   simethicone 80 MG chewable tablet Commonly known as: MYLICON Chew 80-160 mg by mouth as needed for flatulence.   Ventolin HFA 108 (90 Base) MCG/ACT inhaler Generic drug: albuterol Take 1-2 puffs by mouth every 4 (four) hours as needed for shortness of breath.   vitamin D3 25 MCG tablet Commonly known as: CHOLECALCIFEROL Take 1,000 Units by mouth every other day.               Durable Medical Equipment  (From admission, onward)           Start     Ordered   11/11/23 1131  For home use only DME Bedside commode  Once       Question:  Patient needs a bedside commode to treat with the following condition  Answer:  General weakness   11/11/23 1131   11/11/23 1125  For home use only DME Walker rolling  Once       Question Answer Comment  Walker: With 5 Inch Wheels   Patient needs a walker to treat with the following condition General weakness      11/11/23 1125            Follow-up Information     Gweneth Dimitri, MD Follow up in 1 week(s).   Specialty: Family Medicine Contact information: [address] [place] [address] [phone]         [facility]  [facility] Follow up.   Specialty: Rehabilitation Why: Outpatient Physical Therapy-office to call with visit times. Contact information: [address] [place] [address] [phone]  [facility] Follow up.   Why: rolling walker and bedside commode. Contact information: Located in: [employer] Address: [address], [place], [place] [address]  Phone: [phone]               Allergies  Allergen Reactions   Penicillins Anaphylaxis   Almond (Diagnostic) Hives   Ceftin [Cefuroxime Axetil] Hives and Other (See Comments)    headaches   Ciprofloxacin Hives and Other (See Comments)    headaches   Darvocet [Propoxyphene N-Acetaminophen] Other (See Comments)    headaches   Lactose Intolerance (Gi) Diarrhea   Levaquin [Levofloxacin In D5w] Hives   Sulfa Antibiotics Hives   Sulfamethoxazole-Trimethoprim Other (See Comments)    Unknown   Zyrtec [Cetirizine Hcl] Other (See Comments)    headache   Other Other (See Comments)    Powder in gloves - Rash  MSG - Hives  Swiss Cheese - Hives       The results of significant diagnostics from this hospitalization (including imaging, microbiology, ancillary and laboratory) are listed below for reference.    Microbiology: No results found for this or any previous visit (from the past 240 hours).  Procedures/Studies: ECHO TEE Result Date: 11/05/2023    TRANSESOPHOGEAL ECHO REPORT   Patient Name:   Michelle Wilcox Date of Exam: 11/05/2023 Medical Rec #:  [id number]               Height:       64.0 in Accession #:    [id number]              Weight:       187.0 lb Date of Birth:  [birth year]               BSA:          1.901 m Patient Age:    77 years                BP:           141/110 mmHg Patient Gender: F                       HR:           126 bpm. Exam Location:  Inpatient Procedure: Cardiac Doppler, Color Doppler and Transesophageal  Echo (Both            Spectral and Color Flow Doppler were utilized during procedure). Indications:     Cardioversion  History:         Patient has no prior history of Echocardiogram examinations.  Sonographer:     Harriette Bouillon RDCS Referring Phys:  [id number] CHRISTOPHER RONALD BERGE Diagnosing Phys: Thomasene Ripple DO PROCEDURE: The transesophogeal probe was passed without difficulty through the esophogus of the patient. Sedation performed by different physician. The patient developed no complications during the procedure.  IMPRESSIONS  1. Left ventricular ejection fraction, by estimation, is 40 to 45%. The left ventricle has mildly decreased function. The left ventricle demonstrates global hypokinesis.  2. Right ventricular systolic function is mildly reduced. The right ventricular size is mildly enlarged.  3. Left atrial size was severely dilated. A left atrial/left atrial appendage thrombus was detected.  4. Small to moderate. The pericardial effusion is circumferential. There is no evidence of cardiac tamponade.  5. The mitral valve is abnormal. Mild to moderate mitral valve regurgitation.  6. The aortic valve is tricuspid. Aortic valve regurgitation is mild to moderate. No aortic  stenosis is present.  7. There is borderline dilatation of the ascending aorta, measuring 36 mm. There is mild (Grade II) layered plaque involving the descending aorta. FINDINGS  Left Ventricle: Left ventricular ejection fraction, by estimation, is 40 to 45%. The left ventricle has mildly decreased function. The left ventricle demonstrates global hypokinesis. The left ventricular internal cavity size was normal in size. Right Ventricle: The right ventricular size is mildly enlarged. Right vetricular wall thickness was not well visualized. Right ventricular systolic function is mildly reduced. Left Atrium: Left atrial size was severely dilated. A left atrial/left atrial appendage thrombus was detected. Pericardium: Small to moderate. The  pericardial effusion is circumferential. There is no evidence of cardiac tamponade. Mitral Valve: The mitral valve is abnormal. Mild to moderate mitral valve regurgitation. Tricuspid Valve: The tricuspid valve is normal in structure. Tricuspid valve regurgitation is mild. Aortic Valve: The aortic valve is tricuspid. Aortic valve regurgitation is mild to moderate. No aortic stenosis is present. Pulmonic Valve: The pulmonic valve was not well visualized. Pulmonic valve regurgitation is trivial. No evidence of pulmonic stenosis. Aorta: There is borderline dilatation of the ascending aorta, measuring 36 mm. There is mild (Grade II) layered plaque involving the descending aorta. Pulmonary Artery: The pulmonary artery is not well seen. Venous: The left upper pulmonary vein, left lower pulmonary vein and right upper pulmonary vein are normal.   AORTA Ao Asc diam: 3.60 cm Michelle Wilcox Tobb DO Electronically signed by Thomasene Ripple DO Signature Date/Time: 11/05/2023/5:08:44 PM    Final    EP STUDY Result Date: 11/05/2023 See surgical note for result.  CT Angio Chest Pulmonary Embolism (PE) W or WO Contrast Result Date: 11/04/2023 CLINICAL DATA:  Suspected pulmonary embolism. EXAM: CT ANGIOGRAPHY CHEST WITH CONTRAST TECHNIQUE: Multidetector CT imaging of the chest was performed using the standard protocol during bolus administration of intravenous contrast. Multiplanar CT image reconstructions and MIPs were obtained to evaluate the vascular anatomy. RADIATION DOSE REDUCTION: This exam was performed according to the departmental dose-optimization program which includes automated exposure control, adjustment of the mA and/or kV according to patient size and/or use of iterative reconstruction technique. CONTRAST:  75mL OMNIPAQUE IOHEXOL 350 MG/ML SOLN COMPARISON:  None Available. FINDINGS: Cardiovascular: There is moderate severity calcification of the aortic arch, without evidence of aortic aneurysm. Satisfactory opacification of  the pulmonary arteries to the segmental level. No evidence of pulmonary embolism. There is mild cardiomegaly. No pericardial effusion. Mediastinum/Nodes: Mild pretracheal lymphadenopathy is seen. Thyroid gland, trachea, and esophagus demonstrate no significant findings. Lungs/Pleura: Mild lingular, posterolateral right middle lobe and posterior bilateral lower lobe scarring and/or atelectasis is seen, left greater than right. There is a small left pleural effusion. No pneumothorax is identified. Upper Abdomen: Noninflamed diverticula are seen throughout the splenic flexure. Musculoskeletal: No chest wall abnormality. No acute or significant osseous findings. Review of the MIP images confirms the above findings. IMPRESSION: 1. No evidence of pulmonary embolism. 2. Mild lingular, right middle lobe and posterior bilateral lower lobe scarring and/or atelectasis, left greater than right. 3. Small left pleural effusion. 4. Colonic diverticulosis. 5. Aortic atherosclerosis. Aortic Atherosclerosis (ICD10-I70.0). Electronically Signed   By: Aram Candela M.D.   On: 11/04/2023 21:23   DG Chest Port 1 View Result Date: 11/04/2023 CLINICAL DATA:  Atrial fibrillation EXAM: PORTABLE CHEST 1 VIEW COMPARISON:  02/13/2023 FINDINGS: Mild cardiomegaly. No confluent airspace opacities, effusions or edema. Minimal linear densities in the lung bases, favor atelectasis. No acute bony abnormality. IMPRESSION: Cardiomegaly.  No overt edema. Minimal bibasilar  atelectasis. Electronically Signed   By: Charlett Nose M.D.   On: 11/04/2023 17:23    Labs: BNP (last 3 results) Recent Labs    11/04/23 1619  BNP 629.3*   Basic Metabolic Panel: Recent Labs  Lab 11/05/23 0456 11/06/23 0353 11/07/23 0830 11/08/23 0500 11/09/23 0818 11/10/23 0844  NA 140 139 139 137 136 139  K 3.5 4.2 3.4* 3.4* 4.6 3.7  CL 105 107 103 104 105 105  CO2 24 25 24 23  20* 25  GLUCOSE 96 128* 95 106* 89 132*  BUN 12 9 14 17 13 19   CREATININE 0.77  0.77 0.83 0.79 0.75 0.73  CALCIUM 8.3* 7.8* 8.5* 7.7* 8.3* 8.2*  MG 1.9 2.0  --  1.8 2.4  --    Liver Function Tests: Recent Labs  Lab 11/05/23 0456 11/06/23 0353  AST 39 29  ALT 39 28  ALKPHOS 57 50  BILITOT 0.7 0.4  PROT 5.7* 5.4*  ALBUMIN 3.2* 3.0*  CBC: Recent Labs  Lab 11/05/23 0456 11/06/23 0353 11/07/23 0830 11/08/23 0500 11/09/23 0818 11/10/23 0844  WBC 12.8* 13.1* 13.2* 17.7* 14.8* 10.4  NEUTROABS 8.6*  --   --   --   --   --   HGB 12.4 11.7* 13.5 12.7 15.6* 13.5  HCT 37.7 36.1 41.8 37.8 48.3* 40.4  MCV 99.5 99.4 99.3 97.4 101.5* 97.8  PLT 309 289 350 323 338 323   No results for input(s): "VITAMINB12", "FOLATE", "FERRITIN", "TIBC", "IRON", "RETICCTPCT" in the last 72 hours. Urinalysis    Component Value Date/Time   COLORURINE YELLOW 08/03/2017 2321   APPEARANCEUR CLEAR 08/03/2017 2321   LABSPEC 1.020 08/03/2017 2321   LABSPEC 1.025 03/27/2016 0816   PHURINE 5.5 08/03/2017 2321   GLUCOSEU NEGATIVE 08/03/2017 2321   GLUCOSEU Negative 03/27/2016 0816   HGBUR NEGATIVE 08/03/2017 2321   BILIRUBINUR NEGATIVE 08/03/2017 2321   BILIRUBINUR Positive by [employer] 03/27/2016 0816   KETONESUR TRACE (A) 08/03/2017 2321   PROTEINUR NEGATIVE 08/03/2017 2321   UROBILINOGEN 0.2 03/27/2016 0816   NITRITE NEGATIVE 08/03/2017 2321   LEUKOCYTESUR NEGATIVE 08/03/2017 2321   LEUKOCYTESUR Small 03/27/2016 0816   Sepsis Labs Recent Labs  Lab 11/07/23 0830 11/08/23 0500 11/09/23 0818 11/10/23 0844  WBC 13.2* 17.7* 14.8* 10.4   Microbiology No results found for this or any previous visit (from the past 240 hours).  Time coordinating discharge: 35  minutes  SIGNED: Lanae Boast, MD  [facility] 11/11/2023, 11:42 AM  If 7PM-7AM, please contact night-coverage [email or web address]

## 2023-11-11 NOTE — Care Management Important Message (Signed)
 Important Message  Patient Details  Name: Natalyia Innes MRN: [id number] Date of Birth: 30-Apr-1946   Important Message Given:  Yes - Medicare IM     Renie Ora 11/11/2023, 11:04 AM

## 2023-11-11 NOTE — Progress Notes (Signed)
 Mobility Specialist Progress Note;   11/11/23 0920  Mobility  Activity Ambulated with assistance in hallway  Level of Assistance Contact guard assist, steadying assist  Assistive Device Front wheel walker  Distance Ambulated (ft) 225 ft  Activity Response Tolerated well  Mobility Referral Yes  Mobility visit 1 Mobility  Mobility Specialist Start Time (ACUTE ONLY) 0920  Mobility Specialist Stop Time (ACUTE ONLY) [id number]  Mobility Specialist Time Calculation (min) (ACUTE ONLY) 18 min   Pt agreeable to mobility. Required MinG assistance for bed mobility and SV during ambulation. HR up to 127bpm w/ activity. No c/o when asked. Pt returned safely back to bed with all needs met, alarm on.   Michelle Wilcox Mobility Specialist Please contact via SecureChat or [employer] [phone]

## 2023-11-13 NOTE — Therapy (Signed)
 OUTPATIENT PHYSICAL THERAPY NEURO EVALUATION   Patient Name: Michelle Wilcox MRN: [id number] DOB:[birth year], 78 y.o., female Today's Date: 11/14/2023   PCP: Gweneth Dimitri, MD  REFERRING PROVIDER: Lanae Boast, MD  END OF SESSION:  PT End of Session - 11/14/23 1534     Visit Number 1    Number of Visits 13    Date for PT Re-Evaluation 12/26/23    Authorization Type [employer] Medicare    PT Start Time 1406    PT Stop Time 1444    PT Time Calculation (min) 38 min    Equipment Utilized During Treatment Gait belt    Activity Tolerance Patient tolerated treatment well    Behavior During Therapy WFL for tasks assessed/performed             Past Medical History:  Diagnosis Date   Anxiety    hx of   Asthma    at times   CHF (congestive heart failure) (HCC)    Depression    hx of   Dyspnea    Giant cell arteritis (HCC)    Gouty arthropathy    Hemolytic anemia (HCC)    Hyperlipidemia    Hypertension    Irregular heart beat    extra beat   Migraines    Polymyalgia (HCC)    Polymyalgia rheumatica (HCC)    Temporal arteritis (HCC)    Past Surgical History:  Procedure Laterality Date   BARTHOLIN GLAND CYST EXCISION     non ca   BREAST BIOPSY Right    CARDIOVERSION N/A 11/05/2023   Procedure: CARDIOVERSION;  Surgeon: Thomasene Ripple, DO;  Location: [facility];  Service: Cardiovascular;  Laterality: N/A;   EYE MUSCLE SURGERY     as a child 31 yrs old   KNEE SURGERY  2004   rt knee   LAPAROSCOPIC SPLENECTOMY N/A 06/18/2018   Procedure: LAPAROSCOPIC SPLENECTOMY ERAS PATHWAY;  Surgeon: Glenna Fellows, MD;  Location: [facility];  Service: General;  Laterality: N/A;   toncil     TONSILLECTOMY     as a child   TRANSESOPHAGEAL ECHOCARDIOGRAM (CATH LAB) N/A 11/05/2023   Procedure: TRANSESOPHAGEAL ECHOCARDIOGRAM;  Surgeon: Thomasene Ripple, DO;  Location: [facility];  Service: Cardiovascular;  Laterality: N/A;   Patient Active Problem List   Diagnosis Date  Noted   Pericardial effusion 11/07/2023   Atrial fibrillation with RVR (HCC) 11/05/2023   Borderline abnormal TFTs 11/05/2023   Macrocytosis 11/05/2023   Atrial fibrillation (HCC) 11/04/2023   PMR (polymyalgia rheumatica) (HCC) 11/04/2023   Primary localized osteoarthrosis of multiple sites 06/15/2022   Diastolic dysfunction without heart failure 03/05/2018   DOE (dyspnea on exertion) 03/05/2018   Preop cardiovascular exam 03/05/2018   Mild reactive airways disease 08/04/2017   Volume overload 08/03/2017   Hypokalemia 08/03/2017   Essential hypertension 08/03/2017   Hyperlipidemia 08/03/2017   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   Hypersensitivity reaction 11/20/2016   Drusen (degenerative) of retina, bilateral 05/22/2016   Nuclear cataract 05/22/2016   Retinal hemorrhage of left eye 05/22/2016   Temporal arteritis (HCC) 05/22/2016   Hemolytic anemia (HCC) 03/19/2016   Breast mass, right 07/01/2012    ONSET DATE: 11/04/23  REFERRING DIAG: I48.91 (ICD-10-CM) - Atrial fibrillation with RVR (HCC)  THERAPY DIAG:  Difficulty in walking, not elsewhere classified  Other abnormalities of gait and mobility  Other symptoms and signs involving the musculoskeletal system  Rationale for Evaluation and Treatment: Rehabilitation  SUBJECTIVE:  SUBJECTIVE STATEMENT: Patient reports that she was in the hospital for a week for A-fib. Reports that cardiology suspects that the steroid shot she got in her knee could have set it off. Reports that she was supposed to have a cardioversion however the esophageal echocardiogram showed a blood clot in the L atrial appendage. Put on Eliquis, then will plan for the cardioversion. Reports that there has been some discrepancy in how long she needs to be on the  blood thinner. Reports that she has been exhausted. After being in the hospital for a few days she was in excruciating pain in her feet. This is better not but not resolved. Was using a cane at PLOF. Reports that she was told that she needs a R knee replacement. Reports that she was not given a HR to aim for with exercise or activity. Reports that she is weaning off of Amiodarone and loaded the dishwasher today for the first time which was a lot of exertion for her, thus HR has been a little higher.    Pt accompanied by: self  PERTINENT HISTORY: Anxiety, asthma, CHF, depression, gouty arthropathy, anemia, HLD, HTN, migraines, polymyalgia rheumatica, temporal arteritis, R knee surgery   PAIN:  Are you having pain? Yes: NPRS scale: "a little bit" Pain location: R knee Pain description: sore Aggravating factors: previous fall Relieving factors: knee brace  PRECAUTIONS: Fall  RED FLAGS: None   WEIGHT BEARING RESTRICTIONS: No  FALLS: Has patient fallen in last 6 months? Yes. Number of falls 1  LIVING ENVIRONMENT: Lives with: lives with their spouse who have stage 4 stomach CA (pt reports that she is having to physically assist him "some") Lives in: Other townhouse  Stairs: 2 stories, sleeps on 1st floor in recliner  Has following equipment at home: Walker - 2 wheeled, cane  PLOF: Independent; currently getting assistance with meals   PATIENT GOALS: improve fatigue   OBJECTIVE:  Note: Objective measures were completed at Evaluation unless otherwise noted.  Vitals: 96% spO2, 97 bpm (102 bpm taken manually),  115/85 mmHg  DIAGNOSTIC FINDINGS: 11/05/23 echo TEE: Left Atrium: Left atrial size was severely dilated. A left atrial/left  atrial appendage thrombus was detected.   COGNITION: Overall cognitive status: Within functional limits for tasks assessed   SENSATION: WFL   POSTURE: rounded shoulders and forward head  LOWER EXTREMITY MMT:    MMT (in sitting) Right Eval  Left Eval  Hip flexion 4+ 4+  Hip extension    Hip abduction 4+ 4+  Hip adduction 4+ 4+  Hip internal rotation    Hip external rotation    Knee flexion 4+ 4+  Knee extension 4+ 4  Ankle dorsiflexion 4+ 4+  Ankle plantarflexion 4 4  Ankle inversion    Ankle eversion    (Blank rows = not tested)   GAIT: Gait pattern: slow gait speed, pushing walker too far forward Assistive device utilized: Walker - 2 wheeled Level of assistance: SBA   FUNCTIONAL TESTS:  5xSTS: 26.23 sec pushing off knees and audible R knee crepitus  Pre: 97% spO2, 89bpm  Post: 95% spO2, 102bpm   80M walk test: 17.53 sec with RW (1.87 ft/sec)  TREATMENT DATE:     PATIENT EDUCATION: Education details: prognosis, POC, edu on pt's max HR for age (142 bpm) and advised pt to monitor this at home and call her Cardiologist if it goes above this, however also recommended that she ask her Cardiologist for a guideline/range for exertion  Person educated: Patient Education method: Explanation Education comprehension: verbalized understanding  HOME EXERCISE PROGRAM: Not initiated   GOALS: Goals reviewed with patient? Yes  SHORT TERM GOALS: Target date: 12/05/2023  Patient to be independent with initial HEP. Baseline: HEP initiated Goal status: INITIAL    LONG TERM GOALS: Target date: 12/26/2023  Patient to be independent with advanced HEP. Baseline: Not yet initiated  Goal status: INITIAL  Patient to demonstrate gait speed of at least 2.0 ft/sec in order to improve access to community.  Baseline: 1.87 ft/sec Goal status: INITIAL  Patient to report tolerance for grocery trip without fatigue limiting.    Baseline: unable Goal status: INITIAL  6 min walk test goal to be made.  Baseline: NT Goal status: INITIAL  Patient to demonstrate 5xSTS test in <15 sec in order to decrease  risk of falls.  Baseline: 26.23 sec pushing off knees and audible R knee crepitus  Goal status: INITIAL   ASSESSMENT:  CLINICAL IMPRESSION:  Patient is a 78 y/o F presenting to OPPT with c/o fatigue and limited activity tolerance s/p hospitalization for A-fib 11/04/23- 11/11/23. Patient today presenting with slight tachycardia at rest, slightly rounded posture, minor knee and ankle weakness, gait deviations, and increased time required for gait and transfers. Prior to current episode, patient was independent. Would benefit from skilled PT services 1-2 x/week for 6 weeks to address aforementioned impairments in order to optimize level of function.    OBJECTIVE IMPAIRMENTS: Abnormal gait, cardiopulmonary status limiting activity, decreased activity tolerance, decreased balance, difficulty walking, decreased strength, and pain.   ACTIVITY LIMITATIONS: carrying, lifting, bending, standing, squatting, stairs, transfers, bathing, reach over head, hygiene/grooming, locomotion level, and caring for others  PARTICIPATION LIMITATIONS: meal prep, cleaning, laundry, driving, shopping, community activity, yard work, and church  PERSONAL FACTORS: Age, Past/current experiences, Time since onset of injury/illness/exacerbation, and 3+ comorbidities: Anxiety, asthma, CHF, depression, gouty arthropathy, anemia, HLD, HTN, migraines, polymyalgia rheumatica, temporal arteritis, R knee surgery   are also affecting patient's functional outcome.   REHAB POTENTIAL: Good  CLINICAL DECISION MAKING: Evolving/moderate complexity  EVALUATION COMPLEXITY: Moderate  PLAN:  PT FREQUENCY: 1-2x/week  PT DURATION: 6 weeks  PLANNED INTERVENTIONS: 97164- PT Re-evaluation, 97110-Therapeutic exercises, 97530- Therapeutic activity, 97112- Neuromuscular re-education, 97535- Self Care, 40981- Manual therapy, [phone]- Gait training, [phone]- Canalith repositioning, [phone]- Aquatic Therapy, Patient/Family education, Balance training,  Stair training, Taping, Dry Needling, Vestibular training, DME instructions, Cryotherapy, and Moist heat  PLAN FOR NEXT SESSION: 6 min walk test, progress endurance while monitoring vitals, possibly using interval training     Baldemar Friday, PT, DPT 11/14/23 3:44 PM  [facility] at [facility] [address] [place], [place] [address] Phone # [phone] Fax # [phone]

## 2023-11-14 ENCOUNTER — Other Ambulatory Visit: Payer: Self-pay

## 2023-11-14 ENCOUNTER — Ambulatory Visit: Attending: Internal Medicine | Admitting: Physical Therapy

## 2023-11-14 ENCOUNTER — Encounter: Payer: Self-pay | Admitting: Physical Therapy

## 2023-11-14 ENCOUNTER — Telehealth: Payer: Self-pay | Admitting: Physical Therapy

## 2023-11-14 DIAGNOSIS — R2689 Other abnormalities of gait and mobility: Secondary | ICD-10-CM | POA: Diagnosis not present

## 2023-11-14 DIAGNOSIS — I4891 Unspecified atrial fibrillation: Secondary | ICD-10-CM | POA: Diagnosis not present

## 2023-11-14 DIAGNOSIS — R29898 Other symptoms and signs involving the musculoskeletal system: Secondary | ICD-10-CM | POA: Diagnosis not present

## 2023-11-14 DIAGNOSIS — R262 Difficulty in walking, not elsewhere classified: Secondary | ICD-10-CM | POA: Diagnosis not present

## 2023-11-14 NOTE — Telephone Encounter (Signed)
 Hello Michelle Wilcox,  I evaluated Ms. Detter in OPPT s/p hospitalization for A-fib.You are scheduled to see her on 11/19/23. I wanted to know if there was a HR range that you recommend that she stays within with exertion or exercise? It would be helpful to know in order to guide our treatments as she did not receive cardioversion and still appears to be slightly tachycardic at rest.  Thanks!  Baldemar Friday, PT, DPT 11/14/23 4:01 PM  [facility] at [facility] [address] [place], [address] [place], [place] [address] Phone # [phone] Fax # [phone]

## 2023-11-17 NOTE — Progress Notes (Unsigned)
 Cardiology Office Note    Date:  11/20/2023  ID:  Michelle Wilcox, Michelle Wilcox 1946-08-16, MRN [id number] PCP:  Michelle Dimitri, MD  Cardiologist:  Michelle Lemma, MD  Electrophysiologist:  None   Chief Complaint: Atrial fibrillation   History of Present Illness: .    Michelle Wilcox is a 78 y.o. female with visit-pertinent history of polymyalgia rheumatica, temporal arteritis, diastolic dysfunction and atrial fibrillation.  Patient presented to ED on 11/04/2023 with new onset atrial fibrillation characterized by rapid ventricular response, heart rate in the 150s.  Patient described a fluttering sensation in her chest.  Atrial fibrillation was identified following a cortisone injection in her knee.  In hospital patient reported shortness of breath, dizziness and palpitations for more than a few days, though exact duration was unclear.  She denied any recent chest pain.  Patient was planned for TEE/DCCV.  However TEE showed decreased EF of 40 to 45% and left atrial appendage thrombus so DCCV was unable to be performed.  Given decrease in EF diltiazem was discontinued, was unable to add p.o. Lopressor as blood pressures were soft.  Patient was loaded on digoxin.  An attempt for better rate control diltiazem was started however this resulted in an episode of symptomatic hypotension and a rapid response with fluid bolus.  Patient was started on IV amiodarone with goal of rate control only.  Patient was discharged on 11/07/2023 on metoprolol succinate 50 mg twice daily, digoxin 0.125 mg daily and Eliquis.  Patient was started on amiodarone 400 mg twice daily for 5 days followed by 200 mg twice daily for 2 weeks followed by 200 mg daily thereafter.  Today she presents for follow-up.  She reports that she is doing well overall.  She notes that her heart rate has overall been well-controlled at home.  She notes that she has had some intermittent low blood pressures, specifically when she was at PT  earlier this week, they made her remain at the facility until her blood pressure improved.  She notes that she had not been drinking water regularly, notes poor intake.  She is working to increase her hydration now.  She denies any chest pain, notes some slight shortness of breath when she is exerting herself and her heart rate increases, however notes this has improved as well.  She denies any orthopnea, lower extremity edema or PND.  Patient notes she has a significant PTSD after a fall last year. ROS: .   Today she denies chest pain, shortness of breath, lower extremity edema, fatigue, palpitations, melena, hematuria, hemoptysis, diaphoresis, weakness, presyncope, syncope, orthopnea, and PND.  All other systems are reviewed and otherwise negative. Studies Reviewed: Marland Kitchen    EKG:  EKG is ordered today, personally reviewed, demonstrating  EKG Interpretation Date/Time:  Tuesday November 19 2023 15:02:02 EDT Ventricular Rate:  87 PR Interval:    QRS Duration:  82 QT Interval:  356 QTC Calculation: 428 R Axis:   5  Text Interpretation: Atrial fibrillation Low voltage QRS Nonspecific ST and T wave abnormality T wave inversion less evident in Anterolateral leads Confirmed by Michelle Wilcox [id number]) on 11/20/2023 9:58:04 PM   CV Studies: Cardiac studies reviewed are outlined and summarized above. Otherwise please see EMR for full report. Cardiac Studies & Procedures   ______________________________________________________________________________________________   STRESS TESTS  MYOCARDIAL PERFUSION IMAGING 03/11/2018  Narrative  The left ventricular ejection fraction is hyperdynamic (>65%).  Nuclear stress EF: 69%.  There was no ST segment deviation noted during stress.  The study is normal.  This is a low risk study.  Low risk stress nuclear study with normal perfusion and normal left ventricular regional and global systolic function.   ECHOCARDIOGRAM  ECHOCARDIOGRAM COMPLETE  08/04/2017  Narrative *[facility]* *[facility]* [address] [place], [place] [address] [phone]  ------------------------------------------------------------------- Transthoracic Echocardiography  Patient:    Michelle Wilcox, Michelle Wilcox MR #:       [id number] Study Date: 08/04/2017 Gender:     F Age:        71 Height:     162.6 cm Weight:     89.7 kg BSA:        2.05 m^2 Pt. Status: Room:       [address]    Michelle Wilcox [id number] PERFORMING   Michelle Wilcox, Inpatient SONOGRAPHER  Michelle Wilcox ADMITTING    Michelle Wilcox ORDERING     Michelle Wilcox REFERRING    Michelle Wilcox  cc:  ------------------------------------------------------------------- LV EF: 60% -   65%  ------------------------------------------------------------------- Indications:      Dyspnea 786.09.  ------------------------------------------------------------------- History:   Risk factors:  Hypertension. Dyslipidemia.  ------------------------------------------------------------------- Study Conclusions  - Left ventricle: The cavity size was normal. Wall thickness was normal. Systolic function was normal. The estimated ejection fraction was in the range of 60% to 65%. Wall motion was normal; there were no regional wall motion abnormalities. Doppler parameters are consistent with abnormal left ventricular relaxation (grade 1 diastolic dysfunction). - Aortic valve: There was no stenosis. There was mild regurgitation. - Mitral valve: There was mild regurgitation. - Left atrium: The atrium was mildly dilated. - Right ventricle: The cavity size was normal. Systolic function was normal. - Tricuspid valve: Peak RV-RA gradient (S): 35 mm Hg. - Pulmonary arteries: PA peak pressure: 43 mm Hg (S). - Systemic veins: IVC measured 2.3 cm with > 50% respirophasic variation, suggesting RA pressure 8 mmHg.  Impressions:  - Normal LV size with EF 60-65%. Normal RV  size and systolic function. Mild mitral regurgitation. Mild pulmonary hypertension.  ------------------------------------------------------------------- Study data:  No prior study was available for comparison.  Study status:  Routine.  Procedure:  The patient reported no pain pre or post test. Transthoracic echocardiography. Image quality was adequate.  Study completion:  There were no complications. Transthoracic echocardiography.  M-mode, complete 2D, spectral Doppler, and color Doppler.  Birthdate:  Patient birthdate: 11-01-1945.  Age:  Patient is 78 yr old.  Sex:  Gender: female. BMI: 33.9 kg/m^2.  Blood pressure:     119/66  Patient status: Inpatient.  Study date:  Study date: 08/04/2017. Study time: 10:08 AM.  Location:  Bedside.  -------------------------------------------------------------------  ------------------------------------------------------------------- Left ventricle:  The cavity size was normal. Wall thickness was normal. Systolic function was normal. The estimated ejection fraction was in the range of 60% to 65%. Wall motion was normal; there were no regional wall motion abnormalities. Doppler parameters are consistent with abnormal left ventricular relaxation (grade 1 diastolic dysfunction).  ------------------------------------------------------------------- Aortic valve:   Trileaflet.  Doppler:   There was no stenosis. There was mild regurgitation.  ------------------------------------------------------------------- Aorta:  Aortic root: The aortic root was normal in size. Ascending aorta: The ascending aorta was normal in size.  ------------------------------------------------------------------- Mitral valve:   Normal thickness leaflets .  Doppler:   There was no evidence for stenosis.   There was mild regurgitation.    Peak gradient (D): 3 mm Hg.  ------------------------------------------------------------------- Left atrium:  The atrium was mildly  dilated.  ------------------------------------------------------------------- Right  ventricle:  The cavity size was normal. Systolic function was normal.  ------------------------------------------------------------------- Pulmonic valve:    Structurally normal valve.   Cusp separation was normal.  Doppler:  Transvalvular velocity was within the normal range. There was no regurgitation.  ------------------------------------------------------------------- Tricuspid valve:   Doppler:  There was trivial regurgitation.  ------------------------------------------------------------------- Right atrium:  The atrium was normal in size.  ------------------------------------------------------------------- Pericardium:  There was no pericardial effusion.  ------------------------------------------------------------------- Systemic veins:  IVC measured 2.3 cm with > 50% respirophasic variation, suggesting RA pressure 8 mmHg.  ------------------------------------------------------------------- Measurements  Left ventricle                         Value        Reference LV ID, ED, PLAX chordal                52    mm     43 - 52 LV ID, ES, PLAX chordal                28.5  mm     23 - 38 LV fx shortening, PLAX chordal         45    %      >=29 LV PW thickness, ED                    10.9  mm     ---------- IVS/LV PW ratio, ED                    0.89         <=1.3 Stroke volume, 2D                      79    ml     ---------- Stroke volume/bsa, 2D                  39    ml/m^2 ---------- LV e&', lateral                         9.25  cm/s   ---------- LV E/e&', lateral                       8.65         ---------- LV e&', medial                          7.83  cm/s   ---------- LV E/e&', medial                        10.22        ---------- LV e&', average                         8.54  cm/s   ---------- LV E/e&', average                       9.37         ----------  Ventricular septum                      Value        Reference IVS thickness, ED  9.68  mm     ----------  LVOT                                   Value        Reference LVOT ID, S                             20    mm     ---------- LVOT area                              3.14  cm^2   ---------- LVOT peak velocity, S                  117   cm/s   ---------- LVOT mean velocity, S                  73.2  cm/s   ---------- LVOT VTI, S                            25.1  cm     ---------- LVOT peak gradient, S                  5     mm Hg  ----------  Aorta                                  Value        Reference Aortic root ID, ED                     28    mm     ----------  Left atrium                            Value        Reference LA ID, A-P, ES                         44    mm     ---------- LA ID/bsa, A-P                         2.15  cm/m^2 <=2.2 LA volume, S                           64    ml     ---------- LA volume/bsa, S                       31.2  ml/m^2 ---------- LA volume, ES, 1-p A4C                 84.1  ml     ---------- LA volume/bsa, ES, 1-p A4C             41    ml/m^2 ---------- LA volume, ES, 1-p A2C                 42.1  ml     ---------- LA volume/bsa, ES, 1-p A2C  20.5  ml/m^2 ----------  Mitral valve                           Value        Reference Mitral E-wave peak velocity            80    cm/s   ---------- Mitral A-wave peak velocity            85.9  cm/s   ---------- Mitral deceleration time               201   ms     150 - 230 Mitral peak gradient, D                3     mm Hg  ---------- Mitral E/A ratio, peak                 0.9          ----------  Pulmonary arteries                     Value        Reference PA pressure, S, DP             (H)     43    mm Hg  <=30  Tricuspid valve                        Value        Reference Tricuspid regurg peak velocity         294   cm/s   ---------- Tricuspid peak RV-RA gradient          35    mm Hg   ----------  Right atrium                           Value        Reference RA ID, S-I, ES, A4C            (H)     52.5  mm     34 - 49 RA area, ES, A4C                       12.9  cm^2   8.3 - 19.5 RA volume, ES, A/L                     26.6  ml     ---------- RA volume/bsa, ES, A/L                 13    ml/m^2 ----------  Systemic veins                         Value        Reference Estimated CVP                          8     mm Hg  ----------  Right ventricle                        Value        Reference TAPSE  21.2  mm     ---------- RV s&', lateral, S                      16.6  cm/s   ----------  Legend: (L)  and  (H)  mark values outside specified reference range.  ------------------------------------------------------------------- Prepared and Electronically Authenticated by  Marca Ancona, M.D. 2018-12-16T13:15:25   TEE  ECHO TEE 11/05/2023  Narrative TRANSESOPHOGEAL ECHO REPORT    Patient Name:   EIKO MCGOWEN Date of Exam: 11/05/2023 Medical Rec #:  [id number]               Height:       64.0 in Accession #:    [id number]              Weight:       187.0 lb Date of Birth:  Jan 13, 1946               BSA:          1.901 m Patient Age:    77 years                BP:           141/110 mmHg Patient Gender: F                       HR:           126 bpm. Exam Location:  Inpatient  Procedure: Cardiac Doppler, Color Doppler and Transesophageal Echo (Both Spectral and Color Flow Doppler were utilized during procedure).  Indications:     Cardioversion  History:         Patient has no prior history of Echocardiogram examinations.  Sonographer:     Harriette Bouillon RDCS Referring Phys:  [id number] BERGE Diagnosing Phys: Thomasene Ripple DO  PROCEDURE: The transesophogeal probe was passed without difficulty through the esophogus of the patient. Sedation performed by different physician. The patient developed no complications  during the procedure.  IMPRESSIONS   1. Left ventricular ejection fraction, by estimation, is 40 to 45%. The left ventricle has mildly decreased function. The left ventricle demonstrates global hypokinesis. 2. Right ventricular systolic function is mildly reduced. The right ventricular size is mildly enlarged. 3. Left atrial size was severely dilated. A left atrial/left atrial appendage thrombus was detected. 4. Small to moderate. The pericardial effusion is circumferential. There is no evidence of cardiac tamponade. 5. The mitral valve is abnormal. Mild to moderate mitral valve regurgitation. 6. The aortic valve is tricuspid. Aortic valve regurgitation is mild to moderate. No aortic stenosis is present. 7. There is borderline dilatation of the ascending aorta, measuring 36 mm. There is mild (Grade II) layered plaque involving the descending aorta.  FINDINGS Left Ventricle: Left ventricular ejection fraction, by estimation, is 40 to 45%. The left ventricle has mildly decreased function. The left ventricle demonstrates global hypokinesis. The left ventricular internal cavity size was normal in size.  Right Ventricle: The right ventricular size is mildly enlarged. Right vetricular wall thickness was not well visualized. Right ventricular systolic function is mildly reduced.  Left Atrium: Left atrial size was severely dilated. A left atrial/left atrial appendage thrombus was detected.  Pericardium: Small to moderate. The pericardial effusion is circumferential. There is no evidence of cardiac tamponade.  Mitral Valve: The mitral valve is abnormal. Mild to moderate mitral valve regurgitation.  Tricuspid Valve: The tricuspid valve is normal in structure. Tricuspid valve regurgitation is  mild.  Aortic Valve: The aortic valve is tricuspid. Aortic valve regurgitation is mild to moderate. No aortic stenosis is present.  Pulmonic Valve: The pulmonic valve was not well visualized. Pulmonic valve  regurgitation is trivial. No evidence of pulmonic stenosis.  Aorta: There is borderline dilatation of the ascending aorta, measuring 36 mm. There is mild (Grade II) layered plaque involving the descending aorta.  Pulmonary Artery: The pulmonary artery is not well seen.  Venous: The left upper pulmonary vein, left lower pulmonary vein and right upper pulmonary vein are normal.    AORTA Ao Asc diam: 3.60 cm  Kardie Tobb DO Electronically signed by Thomasene Ripple DO Signature Date/Time: 11/05/2023/5:08:44 PM    Final        ______________________________________________________________________________________________       Current Reported Medications:.    Current Meds  Medication Sig   acetaminophen (TYLENOL) 650 MG CR tablet Take 1,300 mg by mouth 2 (two) times daily.   ALPRAZolam (XANAX) 0.25 MG tablet Take 0.25 mg by mouth as needed.   amiodarone (PACERONE) 200 MG tablet Take 2 tablets (400 mg total) by mouth 2 (two) times daily for 3 days, THEN 1 tablet (200 mg total) 2 (two) times daily for 14 days, THEN 1 tablet (200 mg total) daily.   bisacodyl (DULCOLAX) 5 MG EC tablet Take 10 mg by mouth daily as needed for moderate constipation.   diphenhydrAMINE (BENADRYL) 25 MG tablet Take 25-50 mg by mouth daily as needed for allergies.   folic acid (FOLVITE) 1 MG tablet TAKE 1 TABLET(1 MG) BY MOUTH DAILY   furosemide (LASIX) 40 MG tablet Take 20 mg by mouth every evening.   gabapentin (NEURONTIN) 100 MG capsule Take 2 capsules (200 mg total) by mouth 2 (two) times daily.   lactase (LACTAID) 3000 units tablet Take 9,000-15,000 Units by mouth as needed (consuming dairy products).   loperamide (IMODIUM A-D) 2 MG tablet Take 2-4 mg by mouth as needed for diarrhea or loose stools (takes 2 if needed).   loratadine (CLARITIN) 10 MG tablet Take 10 mg by mouth daily.   Multiple Vitamin (MULTIVITAMIN) tablet Take 1 tablet by mouth every evening.    Potassium Chloride ER 20 MEQ TBCR Take 0.5  tablets by mouth daily.   predniSONE (DELTASONE) 5 MG tablet Take 1 tablet (5 mg total) by mouth daily with breakfast.   Probiotic Product (ALIGN) 4 MG CAPS Take 4 mg by mouth every morning.   rosuvastatin (CRESTOR) 5 MG tablet Take 1 tablet by mouth every other day.   simethicone (MYLICON) 80 MG chewable tablet Chew 80-160 mg by mouth as needed for flatulence.   VENTOLIN HFA 108 (90 Base) MCG/ACT inhaler Take 1-2 puffs by mouth every 4 (four) hours as needed for shortness of breath.   Vitamin D3 (VITAMIN D) 25 MCG tablet Take 1,000 Units by mouth every other day.   [DISCONTINUED] apixaban (ELIQUIS) 5 MG TABS tablet Take 1 tablet (5 mg total) by mouth 2 (two) times daily.   [DISCONTINUED] digoxin (LANOXIN) 0.125 MG tablet Take 1 tablet (0.125 mg total) by mouth daily.   [DISCONTINUED] metoprolol succinate (TOPROL-XL) 50 MG 24 hr tablet Take 1 tablet (50 mg total) by mouth 2 (two) times daily. Take with or immediately following a meal.   Physical Exam:    VS:  BP 106/66 (BP Location: Left Arm, Patient Position: Sitting, Cuff Size: Normal)   Pulse 87   Ht 5\' 3"  (1.6 m)   Wt 181 lb 3.2 oz (82.2  kg)   SpO2 94%   BMI 32.10 kg/m    Wt Readings from Last 3 Encounters:  11/19/23 181 lb 3.2 oz (82.2 kg)  11/04/23 187 lb (84.8 kg)  05/20/23 182 lb 12.8 oz (82.9 kg)    GEN: Well nourished, well developed in no acute distress NECK: No JVD; No carotid bruits CARDIAC: Irregular RR, no murmurs, rubs, gallops RESPIRATORY:  Clear to auscultation without rales, wheezing or rhonchi  ABDOMEN: Soft, non-tender, non-distended EXTREMITIES:  No edema; No acute deformity     Asessement and Plan:.    Persistent atrial fibrillation: Patient presented to the ED on 11/04/2023 with elevated heart rate, felt to likely be triggered by recent cortisone injection in the knee.  Symptoms included dyspnea, dizziness, palpitations and near syncope.  Patient went for TEE/DCCV on 3/18 however unfortunately TEE showed a  left atrial appendage thrombus so DCCV was able to be performed.  TEE also showed LVEF of 40 to 45%.  Currently focus on rate control, will plan to repeat TEE/DCCV after being on anticoagulation for 6 weeks per patient request.  Patient notes that her heart rate has been well-controlled since discharge.  For physical therapy her goal resting heart rate is less than 110 bpm.  Patient can participate in physical therapy if she is able to tolerate, goal heart rate is less than 135 bpm with exertion.  Patient denies any bleeding issues on Eliquis. Continue Eliquis 5 mg twice daily, digoxin 0.125 mg daily, metoprolol succinate 50 mg twice daily and amiodarone.  Check BMET and digoxin level.   HFrEF: TEE showed LVEF of 40 to 45% with global hypokinesis, mildly reduced RV function, severe left atrial enlargement and small to moderate pericardial effusion with no evidence of tamponade. GDMT limited due to soft BPs. Given soft blood pressures we will have her take Lasix 40 mg daily only as needed for increased lower extremity edema, shortness of breath weight gain of 2 to 3 pounds overnight or 5 pounds in a week. Cardiomyopathy likely tachycardia mediated in nature, repeat echo in 2 to 3 months after restoration of sinus rhythm and optimization of GDMT.  Consider ischemic evaluation if no improvement in EF.  Hypertension: Blood pressure today 106/66.  Patient notes that her blood pressure was extremely low at physical therapy earlier this week, improved with increased hydration.  She is now working to further increase her dehydration throughout the week.  Also encouraged hydration and increasing salt intake.  Will decrease her Lasix to as needed for lower extremity edema, weight gain of 2 to 3 pounds overnight or 5 pounds in a week or increased shortness of breath.  Encouraged patient to continue monitoring her blood pressures.  Hyperlipidemia: Continue Crestor 5 mg every other day.    Disposition: F/u with Michelle Littler, NP in three weeks.   Signed, Rip Harbour, NP

## 2023-11-18 ENCOUNTER — Encounter (HOSPITAL_BASED_OUTPATIENT_CLINIC_OR_DEPARTMENT_OTHER): Payer: Self-pay

## 2023-11-18 ENCOUNTER — Ambulatory Visit

## 2023-11-18 DIAGNOSIS — R262 Difficulty in walking, not elsewhere classified: Secondary | ICD-10-CM

## 2023-11-18 DIAGNOSIS — R2689 Other abnormalities of gait and mobility: Secondary | ICD-10-CM | POA: Diagnosis not present

## 2023-11-18 DIAGNOSIS — R293 Abnormal posture: Secondary | ICD-10-CM | POA: Diagnosis not present

## 2023-11-18 DIAGNOSIS — I5021 Acute systolic (congestive) heart failure: Secondary | ICD-10-CM | POA: Diagnosis not present

## 2023-11-18 DIAGNOSIS — M6281 Muscle weakness (generalized): Secondary | ICD-10-CM | POA: Diagnosis not present

## 2023-11-18 DIAGNOSIS — M5459 Other low back pain: Secondary | ICD-10-CM | POA: Diagnosis not present

## 2023-11-18 DIAGNOSIS — I4891 Unspecified atrial fibrillation: Secondary | ICD-10-CM | POA: Diagnosis not present

## 2023-11-18 DIAGNOSIS — I951 Orthostatic hypotension: Secondary | ICD-10-CM | POA: Diagnosis not present

## 2023-11-18 DIAGNOSIS — Z79899 Other long term (current) drug therapy: Secondary | ICD-10-CM | POA: Diagnosis not present

## 2023-11-18 DIAGNOSIS — R29898 Other symptoms and signs involving the musculoskeletal system: Secondary | ICD-10-CM | POA: Diagnosis not present

## 2023-11-18 NOTE — Therapy (Signed)
 OUTPATIENT PHYSICAL THERAPY THORACOLUMBAR TREATMENT   Patient Name: Michelle Wilcox MRN: [id number] DOB:04/05/1946, 78 y.o., female Today's Date: 11/18/2023 Progress Note Reporting Period 07/21/24 to 10/28/23  See note below for Objective Data and Assessment of Progress/Goals.     END OF SESSION:  PT End of Session - 11/18/23 1049     Visit Number 11   ortho (osteoporosis)   PT Start Time 1017    PT Stop Time 1120   part of session monitoring for BP   PT Time Calculation (min) 63 min    Activity Tolerance Treatment limited secondary to medical complications (Comment)    Behavior During Therapy WFL for tasks assessed/performed                       Past Medical History:  Diagnosis Date   Anxiety    hx of   Asthma    at times   CHF (congestive heart failure) (HCC)    Depression    hx of   Dyspnea    Giant cell arteritis (HCC)    Gouty arthropathy    Hemolytic anemia (HCC)    Hyperlipidemia    Hypertension    Irregular heart beat    extra beat   Migraines    Polymyalgia (HCC)    Polymyalgia rheumatica (HCC)    Temporal arteritis (HCC)    Past Surgical History:  Procedure Laterality Date   BARTHOLIN GLAND CYST EXCISION     non ca   BREAST BIOPSY Right    CARDIOVERSION N/A 11/05/2023   Procedure: CARDIOVERSION;  Surgeon: Thomasene Ripple, DO;  Location: [facility];  Service: Cardiovascular;  Laterality: N/A;   EYE MUSCLE SURGERY     as a child 68 yrs old   KNEE SURGERY  2004   rt knee   LAPAROSCOPIC SPLENECTOMY N/A 06/18/2018   Procedure: LAPAROSCOPIC SPLENECTOMY ERAS PATHWAY;  Surgeon: Glenna Fellows, MD;  Location: [facility];  Service: General;  Laterality: N/A;   toncil     TONSILLECTOMY     as a child   TRANSESOPHAGEAL ECHOCARDIOGRAM (CATH LAB) N/A 11/05/2023   Procedure: TRANSESOPHAGEAL ECHOCARDIOGRAM;  Surgeon: Thomasene Ripple, DO;  Location: [facility];  Service: Cardiovascular;  Laterality: N/A;   Patient Active Problem  List   Diagnosis Date Noted   Pericardial effusion 11/07/2023   Atrial fibrillation with RVR (HCC) 11/05/2023   Borderline abnormal TFTs 11/05/2023   Macrocytosis 11/05/2023   Atrial fibrillation (HCC) 11/04/2023   PMR (polymyalgia rheumatica) (HCC) 11/04/2023   Primary localized osteoarthrosis of multiple sites 06/15/2022   Diastolic dysfunction without heart failure 03/05/2018   DOE (dyspnea on exertion) 03/05/2018   Preop cardiovascular exam 03/05/2018   Mild reactive airways disease 08/04/2017   Volume overload 08/03/2017   Hypokalemia 08/03/2017   Essential hypertension 08/03/2017   Hyperlipidemia 08/03/2017   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   Hypersensitivity reaction 11/20/2016   Drusen (degenerative) of retina, bilateral 05/22/2016   Nuclear cataract 05/22/2016   Retinal hemorrhage of left eye 05/22/2016   Temporal arteritis (HCC) 05/22/2016   Hemolytic anemia (HCC) 03/19/2016   Breast mass, right 07/01/2012    PCP: Gweneth Dimitri, MD  REFERRING PROVIDER: Gweneth Dimitri, MD  REFERRING DIAG: M81.0 (ICD-10-CM) - Age-related osteoporosis without current pathological fracture   Rationale for Evaluation and Treatment: Rehabilitation  THERAPY DIAG:  Difficulty in walking, not elsewhere classified  Other abnormalities of gait and mobility  Other symptoms and signs involving the musculoskeletal  system  Abnormal posture  Muscle weakness (generalized)  ONSET DATE: Fall June 2024, diagnosed with osteoporosis 05/2023  SUBJECTIVE:                                                                                                                                                                                           SUBJECTIVE STATEMENT: I went into a-fib and was in the hospital x 1 week.  I was referred to neuro PT and have had an evaluation there.  Pt is using a walker full time now for safety.    Eval: Pt is a 78 y.o. female who presents to PT with age  related osteoporosis.  Pt had a fall in June 2024 and had PT for several months following.  She fell forward and landed on her head when at work.  She was diagnosed with osteoporosis in September 2024.  Pt uses a cane for community ambulation.   PERTINENT HISTORY:  Osteoporosis, CHF, HTN, polymyalgia, lumbar DDD  PAIN: 10/28/23 Are you having pain? No and Yes: NPRS scale: 0/10 (neck), low back 0/10 Pain location: knee 1/10 Pain description: aching, severe  Aggravating factors: standing and walking  Relieving factors: knee brace , stretching   PRECAUTIONS: Other: osteoporosis  RED FLAGS: None   WEIGHT BEARING RESTRICTIONS: No  FALLS:  Has patient fallen in last 6 months? Yes. 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  DIAGNOSTIC FINDINGS:  03/2023: T score at radius: -2.6 Femur -2.1  SCREENING FOR RED FLAGS: Bowel or bladder incontinence: No Spinal tumors: No Cauda equina syndrome: No Compression fracture: No Abdominal aneurysm: No  COGNITION: Overall cognitive status: Within functional limits for tasks assessed     SENSATION: WFL   POSTURE: rounded shoulders, forward head, and weight shift left  PALPATION: NA  LOWER EXTREMITY MMT:   Knee strength: Lt 4+/5, Rt 4/5, hip 4-/5 bil  FUNCTIONAL TESTS:  5 times sit to stand: 17.49  09/16/23: 5x sit to stand: 15 seconds without hands 10/28/23: 5x sit to stand:  6 min walk test: 880 feet with walking stick (age related norm is 1172) ABC:  700/1600=43.75%   GAIT: Distance walked: 75 Assistive device utilized: None Level of  assistance: Complete Independence Comments: reduced trunk rotation, forward head, antalgic with reduced time on Rt LE  TODAY'S TREATMENT:                                                                                                                              DATE:   11/18/23:  Discussion regarding exercise and how to monitor HR and 02.  Long arc quads: 5" seconds 2x10- HR to 107 and 02 to 92- rest break taken for HR and 02 to improve Seated marching 2x10 bil: HR 85-100, O2 93-96 At end of session, pt asked to check BP.  She has been monitoring at home.   Readings: 70/45, 66/59- pt supine due to not feeling well.  Monitored and next measures: 96/62 in supine, then 104/58 in sitting Pt will see MD at 1:00 today and will discuss these symptoms.  10/28/23   Nustep L4 x 6 min-PT present to discuss progress 6 min walk test and other objective measures   Educated regarding importance of resuming exercises after fall Seated hamstring stretch 2 x 30 sec bilateral   10/21/23   Nustep L4 x 6 min-PT present to discuss progress Standing on balance pad: hip to shoulder diagonal with 5# kettlebell 2x10 bil each  Seated hamstring stretch 2 x 30 sec bilateral  Holding 5# kettle bell on 1 side: step tap to 6" step 2x10 bil each  Sit to stand no UE support 2x10 with 5# kettlebell- seated on balance pad Farmer's carry: 8# kettle bell on each side- 1 lap each hand  Hurdles at barre: forward and lateral with step to pattern.  Min UE support at barre Tandem stance: red band shoulder extension and rows 2x10 bil each    PATIENT EDUCATION:  Education details: Access Code: [email or web address], osteoporosis information  Person educated: Patient Education method: Explanation, Demonstration, and Handouts Education comprehension: verbalized understanding and returned demonstration  HOME EXERCISE PROGRAM: Access Code: [email or web address] URL:  [email or web address] Date: 08/19/2023 Prepared by: Claude Manges  Exercises - Standing Hip Abduction with Counter Support  - 1 x daily - 7 x weekly - 1-2 sets - 5-10 reps - Standing Hip Extension with Counter Support  - 1 x daily - 7 x weekly - 1-2 sets - 5-10 reps - Sit to Stand Without Arm Support  - 1 x daily - 7 x weekly - 1-2 sets - 10 reps - Standing Shoulder External Rotation with Resistance  - 2 x daily - 7 x weekly - 1-2 sets - 10 reps - Supine Bridge  - 2 x daily - 7 x weekly - 1-3 sets - 10 reps - Standing Shoulder Row with Anchored Resistance  - 1 x daily - 7 x weekly - 2 sets - 10 reps - Shoulder extension with resistance - Neutral  - 1 x daily - 7 x weekly - 2 sets - 10 reps - Standing Shoulder Horizontal Abduction with Resistance  - 1 x daily -  7 x weekly - 2 sets - 10 reps  ASSESSMENT:  CLINICAL IMPRESSION: Pt was hospitalized with a-fib and has been evaluated for neuro PT at [facility] neuro.  Pt will see PA at the cardiologist tomorrow.  At this time, we will close this plan of care as she will be addressing gait, strength and endurance with PT at [facility].  Pt performed gentle seated exercises and some walking today and PT monitored 02 and HR throughout. PT educated regarding safe HR and oxygen numbers. At end of session, BP dropped to 66/59 and she was monitored in the clinic until this elevated to 104/58.  She will see MD this afternoon We discussed exercise and movement limits until she sees cardiology. PA tomorrow.    OBJECTIVE IMPAIRMENTS: Abnormal gait, decreased balance, decreased endurance, decreased mobility, decreased strength, increased muscle spasms, improper body mechanics, postural dysfunction, and pain.   ACTIVITY LIMITATIONS: standing, stairs, transfers, and locomotion level  PARTICIPATION LIMITATIONS: meal prep, community activity, and occupation  PERSONAL FACTORS: Social background, Time since onset of injury/illness/exacerbation, and  1-2 comorbidities: osteoporosis, lumbar DDD  are also affecting patient's functional outcome.   REHAB POTENTIAL: Good  CLINICAL DECISION MAKING: Stable/uncomplicated  EVALUATION COMPLEXITY: Low   GOALS: Goals reviewed with patient? Yes  SHORT TERM GOALS: Target date: 10/11/23    Patient will verbally understand ways to strengthen postural musculature. Baseline: independent in current HEP for strength (2/17/25_ Goal status: MET   2.  Initiate HEP focusing on managing osteoporosis postural deficits and strength deficits. Baseline:  Goal status: MET  3.  Perform 5x sit to stand in < or = to 14 seconds  Baseline: 15 seconds (09/16/23) Goal status: in progress   4.  Report > or = to 20% reduction in LBP with standing and walking  Baseline:  Goal status: MET      LONG TERM GOALS: Target date:12/23/2023      Patient will verbally understand ways to manage osteoporosis with diet and correct to reduce strain postures on spine. Baseline:  Goal status: in progress   2.  Improve 6 min walk test to > or = to 1050 feet to improve community ambulation  Baseline: 880 feet  Goal status: INITIAL  3.  Perform 5x sit to stand in < or = to 13 seconds to reduce falls risk Baseline: 15 seconds (09/16/23) Goal status: In progress   4.  Improve ABC to > or = to 60% to improve confidence in the community  Baseline: 43.75%  Goal status:NEW      PLAN:  PT FREQUENCY: 1x/week  PT DURATION: 6 weeks  PLANNED INTERVENTIONS: 97110-Therapeutic exercises, 97530- Therapeutic activity, 97112- Neuromuscular re-education, 97535- Self Care, 11914- Manual therapy, [id number]- Aquatic Therapy, 97014- Electrical stimulation (unattended), [phone]- Electrical stimulation (manual), Patient/Family education, Balance training, Stair training, Taping, Dry Needling, Vestibular training, Cryotherapy, and Moist heat.  PLAN FOR NEXT SESSION: address balance, alignment, postural strength  Lorrene Reid,  PT 11/18/23 12:14 PM  PHYSICAL THERAPY DISCHARGE SUMMARY  Visits from Start of Care: 11  Current functional level related to goals / functional outcomes: See above for current status.  Pt has had a change in medical status with hospitalization due to a-fib   Remaining deficits: See above    Education / Equipment: Fall prevention, safe HR and oxygen levels with exercise    Patient agrees to discharge. Patient goals were partially met. Patient is being discharged due to a change in medical status.  Lorrene Reid, PT 11/18/23 12:14 PM  [facility] [address], [address] [place], [place] [address] Phone # [phone] Fax [phone]

## 2023-11-19 ENCOUNTER — Ambulatory Visit: Attending: Cardiology | Admitting: Cardiology

## 2023-11-19 VITALS — BP 106/66 | HR 87 | Ht 63.0 in | Wt 181.2 lb

## 2023-11-19 DIAGNOSIS — E782 Mixed hyperlipidemia: Secondary | ICD-10-CM

## 2023-11-19 DIAGNOSIS — I4819 Other persistent atrial fibrillation: Secondary | ICD-10-CM

## 2023-11-19 DIAGNOSIS — I10 Essential (primary) hypertension: Secondary | ICD-10-CM

## 2023-11-19 DIAGNOSIS — I5020 Unspecified systolic (congestive) heart failure: Secondary | ICD-10-CM

## 2023-11-19 DIAGNOSIS — I4891 Unspecified atrial fibrillation: Secondary | ICD-10-CM

## 2023-11-19 MED ORDER — METOPROLOL SUCCINATE ER 50 MG PO TB24: 50.0000 mg | ORAL_TABLET | Freq: Two times a day (BID) | ORAL | 3 refills | Status: DC

## 2023-11-19 MED ORDER — DIGOXIN 125 MCG PO TABS: 0.1250 mg | ORAL_TABLET | Freq: Every day | ORAL | 1 refills | Status: DC

## 2023-11-19 MED ORDER — APIXABAN 5 MG PO TABS: 5.0000 mg | ORAL_TABLET | Freq: Two times a day (BID) | ORAL | 3 refills | Status: DC

## 2023-11-19 NOTE — Patient Instructions (Signed)
 Medication Instructions:  No Changes  Lab Work: Today: BMET and Digoxin Level   Follow-Up: At [facility], you and your health needs are our priority.  As part of our continuing mission to provide you with exceptional heart care, our providers are all part of one team.  This team includes your primary Cardiologist (physician) and Advanced Practice Providers or APPs (Physician Assistants and Nurse Practitioners) who all work together to provide you with the care you need, when you need it.  Your next appointment:   3 week(s)  Provider:   Reather Littler, NP        Valet parking services will be available as well.

## 2023-11-20 ENCOUNTER — Encounter: Payer: Self-pay | Admitting: Cardiology

## 2023-11-20 ENCOUNTER — Ambulatory Visit: Attending: Internal Medicine

## 2023-11-20 ENCOUNTER — Telehealth: Payer: Self-pay | Admitting: Cardiology

## 2023-11-20 ENCOUNTER — Other Ambulatory Visit: Payer: Self-pay

## 2023-11-20 DIAGNOSIS — R293 Abnormal posture: Secondary | ICD-10-CM | POA: Diagnosis not present

## 2023-11-20 DIAGNOSIS — R262 Difficulty in walking, not elsewhere classified: Secondary | ICD-10-CM | POA: Diagnosis not present

## 2023-11-20 DIAGNOSIS — M6281 Muscle weakness (generalized): Secondary | ICD-10-CM | POA: Insufficient documentation

## 2023-11-20 DIAGNOSIS — R29898 Other symptoms and signs involving the musculoskeletal system: Secondary | ICD-10-CM | POA: Diagnosis not present

## 2023-11-20 DIAGNOSIS — Z79899 Other long term (current) drug therapy: Secondary | ICD-10-CM

## 2023-11-20 DIAGNOSIS — R2689 Other abnormalities of gait and mobility: Secondary | ICD-10-CM | POA: Insufficient documentation

## 2023-11-20 DIAGNOSIS — R899 Unspecified abnormal finding in specimens from other organs, systems and tissues: Secondary | ICD-10-CM

## 2023-11-20 LAB — BASIC METABOLIC PANEL WITH GFR
BUN/Creatinine Ratio: 17 (ref 12–28)
BUN: 16 mg/dL (ref 8–27)
CO2: 26 mmol/L (ref 20–29)
Calcium: 9.3 mg/dL (ref 8.7–10.3)
Chloride: 99 mmol/L (ref 96–106)
Creatinine, Ser: 0.94 mg/dL (ref 0.57–1.00)
Glucose: 91 mg/dL (ref 70–99)
Potassium: 5.3 mmol/L — ABNORMAL HIGH (ref 3.5–5.2)
Sodium: 140 mmol/L (ref 134–144)
eGFR: 62 mL/min/{1.73_m2} (ref >59)

## 2023-11-20 LAB — DIGOXIN LEVEL: Digoxin, Serum: 0.9 ng/mL (ref 0.5–0.9)

## 2023-11-20 NOTE — Telephone Encounter (Signed)
 Patient identification verified by 2 forms. Michelle Rail, RN    Received call from patient  Patient states:   -she has some BP concerns   -Monday at Neuro PT BP was low 66/50  -NP Chad is aware of low BP that occurred on Monday   -This Morning BP prior to medication was ~115/77 (did not write down number)   -This morning at PT BP remains low 86/58  -This morning BP increased when she got home to 96/52  -PT person recommended adjustment to BP medications   -She is taking Amiodarone 200mg  BID   -Takes Metoprolol S 50mg  BID   -Takes Digoxin (0.125mg ) one tablet daily  Patient denies:   -lightheaded/dizziness at time of call  Advised patient to change position slowly, increase fluid intake Reviewed ED warning signs/precautions  Patient verbalized understanding, no questions at this time

## 2023-11-20 NOTE — Telephone Encounter (Signed)
 Attempted to call patient, no answer left message requesting a call back.

## 2023-11-20 NOTE — Therapy (Signed)
 OUTPATIENT PHYSICAL THERAPY NEURO TREATMENT   Patient Name: Michelle Wilcox MRN: [id number] DOB:1946/06/22, 78 y.o., female Today's Date: 11/20/2023   PCP: Gweneth Dimitri, MD  REFERRING PROVIDER: Lanae Boast, MD  END OF SESSION:  PT End of Session - 11/20/23 0757     Visit Number 2    Number of Visits 13    Date for PT Re-Evaluation 12/26/23    Authorization Type BCBS Medicare    PT Start Time 0800    PT Stop Time 0845    PT Time Calculation (min) 45 min    Equipment Utilized During Treatment Gait belt    Activity Tolerance Patient tolerated treatment well    Behavior During Therapy WFL for tasks assessed/performed             Past Medical History:  Diagnosis Date   Anxiety    hx of   Asthma    at times   CHF (congestive heart failure) (HCC)    Depression    hx of   Dyspnea    Giant cell arteritis (HCC)    Gouty arthropathy    Hemolytic anemia (HCC)    Hyperlipidemia    Hypertension    Irregular heart beat    extra beat   Migraines    Polymyalgia (HCC)    Polymyalgia rheumatica (HCC)    Temporal arteritis (HCC)    Past Surgical History:  Procedure Laterality Date   BARTHOLIN GLAND CYST EXCISION     non ca   BREAST BIOPSY Right    CARDIOVERSION N/A 11/05/2023   Procedure: CARDIOVERSION;  Surgeon: Thomasene Ripple, DO;  Location: [facility];  Service: Cardiovascular;  Laterality: N/A;   EYE MUSCLE SURGERY     as a child 72 yrs old   KNEE SURGERY  2004   rt knee   LAPAROSCOPIC SPLENECTOMY N/A 06/18/2018   Procedure: LAPAROSCOPIC SPLENECTOMY ERAS PATHWAY;  Surgeon: Glenna Fellows, MD;  Location: [facility];  Service: General;  Laterality: N/A;   toncil     TONSILLECTOMY     as a child   TRANSESOPHAGEAL ECHOCARDIOGRAM (CATH LAB) N/A 11/05/2023   Procedure: TRANSESOPHAGEAL ECHOCARDIOGRAM;  Surgeon: Thomasene Ripple, DO;  Location: [facility];  Service: Cardiovascular;  Laterality: N/A;   Patient Active Problem List   Diagnosis Date Noted    Pericardial effusion 11/07/2023   Atrial fibrillation with RVR (HCC) 11/05/2023   Borderline abnormal TFTs 11/05/2023   Macrocytosis 11/05/2023   Atrial fibrillation (HCC) 11/04/2023   PMR (polymyalgia rheumatica) (HCC) 11/04/2023   Primary localized osteoarthrosis of multiple sites 06/15/2022   Diastolic dysfunction without heart failure 03/05/2018   DOE (dyspnea on exertion) 03/05/2018   Preop cardiovascular exam 03/05/2018   Mild reactive airways disease 08/04/2017   Volume overload 08/03/2017   Hypokalemia 08/03/2017   Essential hypertension 08/03/2017   Hyperlipidemia 08/03/2017   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   Hypersensitivity reaction 11/20/2016   Drusen (degenerative) of retina, bilateral 05/22/2016   Nuclear cataract 05/22/2016   Retinal hemorrhage of left eye 05/22/2016   Temporal arteritis (HCC) 05/22/2016   Hemolytic anemia (HCC) 03/19/2016   Breast mass, right 07/01/2012    ONSET DATE: 11/04/23  REFERRING DIAG: I48.91 (ICD-10-CM) - Atrial fibrillation with RVR (HCC)  THERAPY DIAG:  Difficulty in walking, not elsewhere classified  Other abnormalities of gait and mobility  Other symptoms and signs involving the musculoskeletal system  Abnormal posture  Muscle weakness (generalized)  Rationale for Evaluation and Treatment: Rehabilitation  SUBJECTIVE:  SUBJECTIVE STATEMENT: Still feeling very tired and fatigued.  Have discontinued PT at the other clinic to only come here. Was at PT on Monday and had very low BP. Have some discomfort in back if standing in one place too long.  Had cardiology appointment on Monday and return in 3 weeks. Had 16 oz water and 10 oz cranberry juice before coming in today (trying to drink more water due to low BP episodes).    Pt  accompanied by: self  PERTINENT HISTORY: Anxiety, asthma, CHF, depression, gouty arthropathy, anemia, HLD, HTN, migraines, polymyalgia rheumatica, temporal arteritis, R knee surgery   PAIN:  Are you having pain? Yes: NPRS scale: "a little bit" Pain location: R knee Pain description: sore Aggravating factors: previous fall Relieving factors: knee brace  PRECAUTIONS: Fall  RED FLAGS: None   WEIGHT BEARING RESTRICTIONS: No  FALLS: Has patient fallen in last 6 months? Yes. Number of falls 1  LIVING ENVIRONMENT: Lives with: lives with their spouse who have stage 4 stomach CA (pt reports that she is having to physically assist him "some") Lives in: Other townhouse  Stairs: 2 stories, sleeps on 1st floor in recliner  Has following equipment at home: Environmental consultant - 2 wheeled, cane  PLOF: Independent; currently getting assistance with meals   PATIENT GOALS: improve fatigue   OBJECTIVE:   TODAY'S TREATMENT: 11/20/23 Activity Comments  Vitals:  Seated x 5 min: 77 bpm, 97%, 81/62 mmHg Standing x 1 min: error message and unable to tolerate standing beyond Seated: 171/146 mmHg, 43 bpm (unsure how accurate due to movement of arm) Seated: 82/64 mmHg, 78 bpm Manual pulse check 115 bpm --instructed in checking pulse manually  Education on self-checks for orthostatic hypotension for home recording Provided written instructions and graph table for recording  Seated LE PRE 2x12   Vitals post-session 86/58 mmHg, 66 bpm (per machine); manual pulse 102 bpm          Note: Objective measures were completed at Evaluation unless otherwise noted.  Vitals: 96% spO2, 97 bpm (102 bpm taken manually),  115/85 mmHg  DIAGNOSTIC FINDINGS: 11/05/23 echo TEE: Left [facility]: Left atrial size was severely dilated. A left atrial/left  atrial appendage thrombus was detected.   COGNITION: Overall cognitive status: Within functional limits for tasks assessed   SENSATION: WFL   POSTURE: rounded shoulders and  forward head  LOWER EXTREMITY MMT:    MMT (in sitting) Right Eval Left Eval  Hip flexion 4+ 4+  Hip extension    Hip abduction 4+ 4+  Hip adduction 4+ 4+  Hip internal rotation    Hip external rotation    Knee flexion 4+ 4+  Knee extension 4+ 4  Ankle dorsiflexion 4+ 4+  Ankle plantarflexion 4 4  Ankle inversion    Ankle eversion    (Blank rows = not tested)   GAIT: Gait pattern: slow gait speed, pushing walker too far forward Assistive device utilized: Walker - 2 wheeled Level of assistance: SBA   FUNCTIONAL TESTS:  5xSTS: 26.23 sec pushing off knees and audible R knee crepitus  Pre: 97% spO2, 89bpm  Post: 95% spO2, 102bpm   75M walk test: 17.53 sec with RW (1.87 ft/sec)  TREATMENT DATE:     PATIENT EDUCATION: Education details: prognosis, POC, edu on pt's max HR for age (142 bpm) and advised pt to monitor this at home and call her Cardiologist if it goes above this, however also recommended that she ask her Cardiologist for a guideline/range for exertion  Person educated: Patient Education method: Explanation Education comprehension: verbalized understanding  HOME EXERCISE PROGRAM: Not initiated   GOALS: Goals reviewed with patient? Yes  SHORT TERM GOALS: Target date: 12/05/2023  Patient to be independent with initial HEP. Baseline: HEP initiated Goal status: INITIAL    LONG TERM GOALS: Target date: 12/26/2023  Patient to be independent with advanced HEP. Baseline: Not yet initiated  Goal status: INITIAL  Patient to demonstrate gait speed of at least 2.0 ft/sec in order to improve access to community.  Baseline: 1.87 ft/sec Goal status: INITIAL  Patient to report tolerance for grocery trip without fatigue limiting.    Baseline: unable Goal status: INITIAL  6 min walk test goal to be made.  Baseline: NT Goal status:  INITIAL  Patient to demonstrate 5xSTS test in <15 sec in order to decrease risk of falls.  Baseline: 26.23 sec pushing off knees and audible R knee crepitus  Goal status: INITIAL   ASSESSMENT:  CLINICAL IMPRESSION: Pt reports feeling of low energy with all activity and lightheadedness to standing positions.  Demonstrates low vital signs at baseline with HR variability exhibited.  Self-care instruction in self-assessment for orthostatic hypotension and educated on techniques for measuring/assessing as well as different conditions of pre and post Rx.  Instructed in manual pulse assessment due to variability and inaccurate measure of pulse oximeter and BP cuff with good return demonstration.  Instructed in seated open chain resistance exercises to improve endurance and provide safe means of physical activity due to ongoing symptoms.  Continued sessions to advance POC details as able and to provide ongoing assessment to physical activity and relevant physiologic response.    OBJECTIVE IMPAIRMENTS: Abnormal gait, cardiopulmonary status limiting activity, decreased activity tolerance, decreased balance, difficulty walking, decreased strength, and pain.   ACTIVITY LIMITATIONS: carrying, lifting, bending, standing, squatting, stairs, transfers, bathing, reach over head, hygiene/grooming, locomotion level, and caring for others  PARTICIPATION LIMITATIONS: meal prep, cleaning, laundry, driving, shopping, community activity, yard work, and church  PERSONAL FACTORS: Age, Past/current experiences, Time since onset of injury/illness/exacerbation, and 3+ comorbidities: Anxiety, asthma, CHF, depression, gouty arthropathy, anemia, HLD, HTN, migraines, polymyalgia rheumatica, temporal arteritis, R knee surgery   are also affecting patient's functional outcome.   REHAB POTENTIAL: Good  CLINICAL DECISION MAKING: Evolving/moderate complexity  EVALUATION COMPLEXITY: Moderate  PLAN:  PT FREQUENCY:  1-2x/week  PT DURATION: 6 weeks  PLANNED INTERVENTIONS: 97164- PT Re-evaluation, 97110-Therapeutic exercises, 97530- Therapeutic activity, [id number]- Neuromuscular re-education, 97535- Self Care, 16109- Manual therapy, [id number]- Gait training, [phone]- Canalith repositioning, [phone]- Aquatic Therapy, Patient/Family education, Balance training, Stair training, Taping, Dry Needling, Vestibular training, DME instructions, Cryotherapy, and Moist heat  PLAN FOR NEXT SESSION: 6 min walk test, progress endurance while monitoring vitals, possibly using interval training     11:19 AM, 11/20/23 M. Shary Decamp, PT, DPT Physical Therapist- [place] Office Number: [phone]

## 2023-11-20 NOTE — Telephone Encounter (Signed)
 Pt c/o BP issue:  1. What are your last 5 BP readings? This morning 86/58 at physical therapy also today it was 96/52   Monday at physical therapy it was 66/50  2. Are you having any other symptoms (ex. Dizziness, headache, blurred vision, passed out)?  A l little lightheaded  3. What is your medication issue? Blood running low

## 2023-11-20 NOTE — Progress Notes (Signed)
 Order for lab redraw placed

## 2023-11-20 NOTE — Telephone Encounter (Signed)
 Michelle Littler D, NP  You1 hour ago (12:24 PM)    Would recommend only taking her lasix as needed for increased lower extremity edema, weight gain of 2-3 lbs overnight or 5 lbs in a week. Unfortunately given difficulty in controlling her heart rate she needs to remain on metoprolol, amiodarone and digoxin. She needs to increase her fluid intake, could also try compression stockings. Michelle Krill, NP 11/20/2023 12:27 PM EDT Back to Top    Please let [name] know that her kidney function is normal, her potassium is slightly elevated, recommend stopping her potassium supplement and rechecking a BMET next week. Digoxin level is therapeutic, continue at current dose. Please remind her to stay well hydrated and follow up as planned.     Patient identification verified by 2 forms. Michelle Rail, RN    Called and spoke to patient  Relayed provider message  Reviewed provider result note  Patient aware to present to lab in 1 week  Patient verbalized understanding, no questions at this time

## 2023-11-21 ENCOUNTER — Telehealth: Payer: Self-pay | Admitting: Cardiology

## 2023-11-21 DIAGNOSIS — L2989 Other pruritus: Secondary | ICD-10-CM | POA: Diagnosis not present

## 2023-11-21 DIAGNOSIS — D225 Melanocytic nevi of trunk: Secondary | ICD-10-CM | POA: Diagnosis not present

## 2023-11-21 DIAGNOSIS — L570 Actinic keratosis: Secondary | ICD-10-CM | POA: Diagnosis not present

## 2023-11-21 DIAGNOSIS — L814 Other melanin hyperpigmentation: Secondary | ICD-10-CM | POA: Diagnosis not present

## 2023-11-21 DIAGNOSIS — L821 Other seborrheic keratosis: Secondary | ICD-10-CM | POA: Diagnosis not present

## 2023-11-21 DIAGNOSIS — L820 Inflamed seborrheic keratosis: Secondary | ICD-10-CM | POA: Diagnosis not present

## 2023-11-21 NOTE — Telephone Encounter (Signed)
 Spoke w/pt in regards to ST-Disability paperwork received [employer] from [place]. Pt states that she has been seen most recently by K.Chad - not sure if paperwork should be completed by Dr. Herbie Baltimore or K.West. Pt aware that Dr. Herbie Baltimore & K.Shelva Majestic will return to office next week.  Pt will complete ROI & Billing forms & pay $29 fee (cash, check, or money order) after labwork on 11-27-23 (afternoon).  Paperwork left in Dr. Elissa Hefty mailbox.  JB, 11-21-23

## 2023-11-25 ENCOUNTER — Encounter

## 2023-11-26 ENCOUNTER — Encounter: Payer: Self-pay | Admitting: Rehabilitative and Restorative Service Providers"

## 2023-11-26 ENCOUNTER — Ambulatory Visit: Admitting: Rehabilitative and Restorative Service Providers"

## 2023-11-26 DIAGNOSIS — M6281 Muscle weakness (generalized): Secondary | ICD-10-CM | POA: Diagnosis not present

## 2023-11-26 DIAGNOSIS — R262 Difficulty in walking, not elsewhere classified: Secondary | ICD-10-CM

## 2023-11-26 DIAGNOSIS — R29898 Other symptoms and signs involving the musculoskeletal system: Secondary | ICD-10-CM | POA: Diagnosis not present

## 2023-11-26 DIAGNOSIS — R293 Abnormal posture: Secondary | ICD-10-CM | POA: Diagnosis not present

## 2023-11-26 DIAGNOSIS — R2689 Other abnormalities of gait and mobility: Secondary | ICD-10-CM | POA: Diagnosis not present

## 2023-11-26 NOTE — Telephone Encounter (Signed)
 RN called and spoke to patient. Patient states she has not been back to work since initial hospitalization 11/04/23.   Patient states she and Charlesetta Garibaldi had a conversation concerning disability .  RN informed patient will need to wait until Charlesetta Garibaldi comes in to the office to have papers filled out.  Patient states she will come to the office tomorrow 11/27/23   to sign and pay the fee for the forms to be process.   Patient voiced understanding.

## 2023-11-26 NOTE — Therapy (Signed)
 OUTPATIENT PHYSICAL THERAPY NEURO TREATMENT   Patient Name: Michelle Wilcox MRN: [id number] DOB:1946-02-01, 78 y.o., female Today's Date: 11/26/2023  PCP: Gweneth Dimitri, MD REFERRING PROVIDER: Lanae Boast, MD  END OF SESSION:  PT End of Session - 11/26/23 1405     Visit Number 3    Number of Visits 13    Date for PT Re-Evaluation 12/26/23    Authorization Type BCBS Medicare    PT Start Time 1405    PT Stop Time 1445    PT Time Calculation (min) 40 min    Equipment Utilized During Treatment Gait belt    Activity Tolerance Patient tolerated treatment well    Behavior During Therapy WFL for tasks assessed/performed            Past Medical History:  Diagnosis Date   Anxiety    hx of   Asthma    at times   CHF (congestive heart failure) (HCC)    Depression    hx of   Dyspnea    Giant cell arteritis (HCC)    Gouty arthropathy    Hemolytic anemia (HCC)    Hyperlipidemia    Hypertension    Irregular heart beat    extra beat   Migraines    Polymyalgia (HCC)    Polymyalgia rheumatica (HCC)    Temporal arteritis (HCC)    Past Surgical History:  Procedure Laterality Date   BARTHOLIN GLAND CYST EXCISION     non ca   BREAST BIOPSY Right    CARDIOVERSION N/A 11/05/2023   Procedure: CARDIOVERSION;  Surgeon: Thomasene Ripple, DO;  Location: [facility];  Service: Cardiovascular;  Laterality: N/A;   EYE MUSCLE SURGERY     as a child 28 yrs old   KNEE SURGERY  2004   rt knee   LAPAROSCOPIC SPLENECTOMY N/A 06/18/2018   Procedure: LAPAROSCOPIC SPLENECTOMY ERAS PATHWAY;  Surgeon: Glenna Fellows, MD;  Location: [facility];  Service: General;  Laterality: N/A;   toncil     TONSILLECTOMY     as a child   TRANSESOPHAGEAL ECHOCARDIOGRAM (CATH LAB) N/A 11/05/2023   Procedure: TRANSESOPHAGEAL ECHOCARDIOGRAM;  Surgeon: Thomasene Ripple, DO;  Location: [facility];  Service: Cardiovascular;  Laterality: N/A;   Patient Active Problem List   Diagnosis Date Noted    Pericardial effusion 11/07/2023   Atrial fibrillation with RVR (HCC) 11/05/2023   Borderline abnormal TFTs 11/05/2023   Macrocytosis 11/05/2023   Atrial fibrillation (HCC) 11/04/2023   PMR (polymyalgia rheumatica) (HCC) 11/04/2023   Primary localized osteoarthrosis of multiple sites 06/15/2022   Diastolic dysfunction without heart failure 03/05/2018   DOE (dyspnea on exertion) 03/05/2018   Preop cardiovascular exam 03/05/2018   Mild reactive airways disease 08/04/2017   Volume overload 08/03/2017   Hypokalemia 08/03/2017   Essential hypertension 08/03/2017   Hyperlipidemia 08/03/2017   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   Hypersensitivity reaction 11/20/2016   Drusen (degenerative) of retina, bilateral 05/22/2016   Nuclear cataract 05/22/2016   Retinal hemorrhage of left eye 05/22/2016   Temporal arteritis (HCC) 05/22/2016   Hemolytic anemia (HCC) 03/19/2016   Breast mass, right 07/01/2012    ONSET DATE: 11/04/23  REFERRING DIAG: I48.91 (ICD-10-CM) - Atrial fibrillation with RVR (HCC)  THERAPY DIAG:  Difficulty in walking, not elsewhere classified  Other abnormalities of gait and mobility  Other symptoms and signs involving the musculoskeletal system  Muscle weakness (generalized)  Rationale for Evaluation and Treatment: Rehabilitation  SUBJECTIVE:  SUBJECTIVE STATEMENT: The patient notes being fatigued "all the time", but last session was very hard. She had a bad day Saturday. Her BP was low, temperature was low, and she could not get warm.  She has more energy today. Pt accompanied by: self  PERTINENT HISTORY: Anxiety, asthma, CHF, depression, gouty arthropathy, anemia, HLD, HTN, migraines, polymyalgia rheumatica, temporal arteritis, R knee surgery   PAIN:  Are you having  pain? Yes: NPRS scale: "a little bit" Pain location: R knee Pain description: sore Aggravating factors: previous fall Relieving factors: knee brace  PRECAUTIONS: Fall  WEIGHT BEARING RESTRICTIONS: No  FALLS: Has patient fallen in last 6 months? Yes. Number of falls 1  LIVING ENVIRONMENT: Lives with: lives with their spouse who have stage 4 stomach CA (pt reports that she is having to physically assist him "some") Lives in: Other townhouse  Stairs: 2 stories, sleeps on 1st floor in recliner  Has following equipment at home: Environmental consultant - 2 wheeled, cane  PLOF: Independent; currently getting assistance with meals   PATIENT GOALS: improve fatigue   OBJECTIVE:   TODAY'S TREATMENT: 11/26/23  Activity Comments  Vitals BP seated: 118/82 HR seated:78   Seated Theraband (yellow) shoulder ER in sitting x 15 reps   Marching x 5 reps R and L alternating LAQ x 10 reps alternating   Heel/toe raises x 10 reps  Standing Heel raises x 10 reps, fatigues patient and she has to rest, HR=93-101   Gait 6 minute walking=437 ft with RW and     PATIENT EDUCATION: Education details: HEP initiated, patient is monitoring BP and vitals at home Person educated: Patient Education method: Explanation Education comprehension: verbalized understanding  HOME EXERCISE PROGRAM: Access Code: [facility] URL: [email or web address] Date: 11/26/2023 Prepared by: Margretta Ditty  Exercises - Seated Bilateral Shoulder External Rotation with Resistance  - 1 x daily - 5 x weekly - 1 sets - 10 reps - Seated Heel Raise  - 1 x daily - 5 x weekly - 1 sets - 10 reps - Seated Long Arc Quad  - 1 x daily - 5 x weekly - 1 sets - 10 reps - Seated March  - 1 x daily - 5 x weekly - 1 sets - 10 reps _________________________________________________________________________________________________ Note: Objective measures were completed at Evaluation unless otherwise noted.  Vitals: 96% spO2, 97 bpm (102 bpm  taken manually),  115/85 mmHg  DIAGNOSTIC FINDINGS: 11/05/23 echo TEE: Left Atrium: Left atrial size was severely dilated. A left atrial/left  atrial appendage thrombus was detected.   COGNITION: Overall cognitive status: Within functional limits for tasks assessed   SENSATION: WFL  POSTURE: rounded shoulders and forward head  LOWER EXTREMITY MMT:   MMT (in sitting) Right Eval Left Eval  Hip flexion 4+ 4+  Hip extension    Hip abduction 4+ 4+  Hip adduction 4+ 4+  Hip internal rotation    Hip external rotation    Knee flexion 4+ 4+  Knee extension 4+ 4  Ankle dorsiflexion 4+ 4+  Ankle plantarflexion 4 4  Ankle inversion    Ankle eversion    (Blank rows = not tested)  GAIT: Gait pattern: slow gait speed, pushing walker too far forward Assistive device utilized: Walker - 2 wheeled Level of assistance: SBA  FUNCTIONAL TESTS:  5xSTS: 26.23 sec pushing off knees and audible R knee crepitus  Pre: 97% spO2, 89bpm  Post: 95% spO2, 102bpm   17M walk test: 17.53 sec with RW (1.87 ft/sec)  __________________________________________________________________________________ GOALS: Goals reviewed with patient? Yes  SHORT TERM GOALS: Target date: 12/05/2023  Patient to be independent with initial HEP. Baseline: HEP initiated Goal status: INITIAL  LONG TERM GOALS: Target date: 12/26/2023  Patient to be independent with advanced HEP. Baseline: Not yet initiated  Goal status: INITIAL  Patient to demonstrate gait speed of at least 2.0 ft/sec in order to improve access to community.  Baseline: 1.87 ft/sec Goal status: INITIAL  Patient to report tolerance for grocery trip without fatigue limiting.    Baseline: unable Goal status: INITIAL  6 min walk test to improve to >600 ft with device as needed Baseline: 437 ft Goal status: INITIAL  Patient to demonstrate 5xSTS  test in <15 sec in order to decrease risk of falls.  Baseline: 26.23 sec pushing off knees and audible R knee crepitus  Goal status: INITIAL   ASSESSMENT:  CLINICAL IMPRESSION: The patient's BP was improved today and she had more energy to participate in therapy. PT completed 6 minute walk test demonstrating significantly reduced distance compared to normative values (>1500 ft is normative). See updated goal. We also initiated seated HEP, as standing there ex fatigued her quickly. Plan to progress to patient tolerance.   OBJECTIVE IMPAIRMENTS: Abnormal gait, cardiopulmonary status limiting activity, decreased activity tolerance, decreased balance, difficulty walking, decreased strength, and pain.   PLAN:  PT FREQUENCY: 1-2x/week  PT DURATION: 6 weeks  PLANNED INTERVENTIONS: 97164- PT Re-evaluation, 97110-Therapeutic exercises, 97530- Therapeutic activity, 97112- Neuromuscular re-education, 97535- Self Care, 84132- Manual therapy, [phone]- Gait training, [phone]- Canalith repositioning, [phone]- Aquatic Therapy, Patient/Family education, Balance training, Stair training, Taping, Dry Needling, Vestibular training, DME instructions, Cryotherapy, and Moist heat  PLAN FOR NEXT SESSION: patient requests back exercises (sleeps in recliner and avoids laying flat), progress endurance while monitoring vitals, possibly using interval training , general conditioning and strengthening.   2:07 PM, 11/26/23 Skiler Tye, PT Physical Therapist- [place] Office Number: [phone]

## 2023-11-27 DIAGNOSIS — Z0279 Encounter for issue of other medical certificate: Secondary | ICD-10-CM

## 2023-11-27 NOTE — Telephone Encounter (Signed)
 Pt came into office to complete FMLA ROI & Billing forms + pay $29 fee.  **Pt states she was told not to have any major dental work due to Afib; however, wants to make sure it is okay to take antibiotic (Clindamycin) prior to cleaning (12/09/23). Pt would like phone call addressing concern.**  JB, 11-27-23

## 2023-11-27 NOTE — Therapy (Incomplete)
 OUTPATIENT PHYSICAL THERAPY NEURO TREATMENT   Patient Name: Michelle Wilcox MRN: [id number] DOB:05/23/1946, 78 y.o., female Today's Date: 11/27/2023  PCP: Gweneth Dimitri, MD REFERRING PROVIDER: Lanae Boast, MD  END OF SESSION:   Past Medical History:  Diagnosis Date   Anxiety    hx of   Asthma    at times   CHF (congestive heart failure) (HCC)    Depression    hx of   Dyspnea    Giant cell arteritis (HCC)    Gouty arthropathy    Hemolytic anemia (HCC)    Hyperlipidemia    Hypertension    Irregular heart beat    extra beat   Migraines    Polymyalgia (HCC)    Polymyalgia rheumatica (HCC)    Temporal arteritis (HCC)    Past Surgical History:  Procedure Laterality Date   BARTHOLIN GLAND CYST EXCISION     non ca   BREAST BIOPSY Right    CARDIOVERSION N/A 11/05/2023   Procedure: CARDIOVERSION;  Surgeon: Thomasene Ripple, DO;  Location: [facility];  Service: Cardiovascular;  Laterality: N/A;   EYE MUSCLE SURGERY     as a child 39 yrs old   KNEE SURGERY  2004   rt knee   LAPAROSCOPIC SPLENECTOMY N/A 06/18/2018   Procedure: LAPAROSCOPIC SPLENECTOMY ERAS PATHWAY;  Surgeon: Glenna Fellows, MD;  Location: [facility];  Service: General;  Laterality: N/A;   toncil     TONSILLECTOMY     as a child   TRANSESOPHAGEAL ECHOCARDIOGRAM (CATH LAB) N/A 11/05/2023   Procedure: TRANSESOPHAGEAL ECHOCARDIOGRAM;  Surgeon: Thomasene Ripple, DO;  Location: [facility];  Service: Cardiovascular;  Laterality: N/A;   Patient Active Problem List   Diagnosis Date Noted   Pericardial effusion 11/07/2023   Atrial fibrillation with RVR (HCC) 11/05/2023   Borderline abnormal TFTs 11/05/2023   Macrocytosis 11/05/2023   Atrial fibrillation (HCC) 11/04/2023   PMR (polymyalgia rheumatica) (HCC) 11/04/2023   Primary localized osteoarthrosis of multiple sites 06/15/2022   Diastolic dysfunction without heart failure 03/05/2018   DOE (dyspnea on exertion) 03/05/2018   Preop cardiovascular  exam 03/05/2018   Mild reactive airways disease 08/04/2017   Volume overload 08/03/2017   Hypokalemia 08/03/2017   Essential hypertension 08/03/2017   Hyperlipidemia 08/03/2017   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   Hypersensitivity reaction 11/20/2016   Drusen (degenerative) of retina, bilateral 05/22/2016   Nuclear cataract 05/22/2016   Retinal hemorrhage of left eye 05/22/2016   Temporal arteritis (HCC) 05/22/2016   Hemolytic anemia (HCC) 03/19/2016   Breast mass, right 07/01/2012    ONSET DATE: 11/04/23  REFERRING DIAG: I48.91 (ICD-10-CM) - Atrial fibrillation with RVR (HCC)  THERAPY DIAG:  No diagnosis found.  Rationale for Evaluation and Treatment: Rehabilitation  SUBJECTIVE:  SUBJECTIVE STATEMENT: The patient notes being fatigued "all the time", but last session was very hard. She had a bad day Saturday. Her BP was low, temperature was low, and she could not get warm.  She has more energy today. Pt accompanied by: self  PERTINENT HISTORY: Anxiety, asthma, CHF, depression, gouty arthropathy, anemia, HLD, HTN, migraines, polymyalgia rheumatica, temporal arteritis, R knee surgery   PAIN:  Are you having pain? Yes: NPRS scale: "a little bit" Pain location: R knee Pain description: sore Aggravating factors: previous fall Relieving factors: knee brace  PRECAUTIONS: Fall  WEIGHT BEARING RESTRICTIONS: No  FALLS: Has patient fallen in last 6 months? Yes. Number of falls 1  LIVING ENVIRONMENT: Lives with: lives with their spouse who have stage 4 stomach CA (pt reports that she is having to physically assist him "some") Lives in: Other townhouse  Stairs: 2 stories, sleeps on 1st floor in recliner  Has following equipment at home: Environmental consultant - 2 wheeled, cane  PLOF: Independent;  currently getting assistance with meals   PATIENT GOALS: improve fatigue   OBJECTIVE:     TODAY'S TREATMENT: 11/28/23 Activity Comments                        TODAY'S TREATMENT: 11/26/23  Activity Comments  Vitals BP seated: 118/82 HR seated:78   Seated Theraband (yellow) shoulder ER in sitting x 15 reps   Marching x 5 reps R and L alternating LAQ x 10 reps alternating   Heel/toe raises x 10 reps  Standing Heel raises x 10 reps, fatigues patient and she has to rest, HR=93-101   Gait 6 minute walking=437 ft with RW and     PATIENT EDUCATION: Education details: HEP initiated, patient is monitoring BP and vitals at home Person educated: Patient Education method: Explanation Education comprehension: verbalized understanding  HOME EXERCISE PROGRAM: Access Code: [facility] URL: [email or web address] Date: 11/26/2023 Prepared by: Margretta Ditty  Exercises - Seated Bilateral Shoulder External Rotation with Resistance  - 1 x daily - 5 x weekly - 1 sets - 10 reps - Seated Heel Raise  - 1 x daily - 5 x weekly - 1 sets - 10 reps - Seated Long Arc Quad  - 1 x daily - 5 x weekly - 1 sets - 10 reps - Seated March  - 1 x daily - 5 x weekly - 1 sets - 10 reps _________________________________________________________________________________________________ Note: Objective measures were completed at Evaluation unless otherwise noted.  Vitals: 96% spO2, 97 bpm (102 bpm taken manually),  115/85 mmHg  DIAGNOSTIC FINDINGS: 11/05/23 echo TEE: Left Atrium: Left atrial size was severely dilated. A left atrial/left  atrial appendage thrombus was detected.   COGNITION: Overall cognitive status: Within functional limits for tasks assessed   SENSATION: WFL  POSTURE: rounded shoulders and forward head  LOWER EXTREMITY MMT:   MMT (in sitting) Right Eval Left Eval  Hip flexion 4+ 4+  Hip extension    Hip abduction 4+ 4+  Hip adduction 4+ 4+  Hip internal rotation     Hip external rotation    Knee flexion 4+ 4+  Knee extension 4+ 4  Ankle dorsiflexion 4+ 4+  Ankle plantarflexion 4 4  Ankle inversion    Ankle eversion    (Blank rows = not tested)  GAIT: Gait pattern: slow gait speed, pushing walker too far forward Assistive device utilized: Walker - 2 wheeled Level of assistance: SBA  FUNCTIONAL TESTS:  5xSTS: 26.23 sec pushing off knees and  audible R knee crepitus  Pre: 97% spO2, 89bpm  Post: 95% spO2, 102bpm   58M walk test: 17.53 sec with RW (1.87 ft/sec)                                                                                                                 __________________________________________________________________________________ GOALS: Goals reviewed with patient? Yes  SHORT TERM GOALS: Target date: 12/05/2023  Patient to be independent with initial HEP. Baseline: HEP initiated Goal status: INITIAL  LONG TERM GOALS: Target date: 12/26/2023  Patient to be independent with advanced HEP. Baseline: Not yet initiated  Goal status: INITIAL  Patient to demonstrate gait speed of at least 2.0 ft/sec in order to improve access to community.  Baseline: 1.87 ft/sec Goal status: INITIAL  Patient to report tolerance for grocery trip without fatigue limiting.    Baseline: unable Goal status: INITIAL  6 min walk test to improve to >600 ft with device as needed Baseline: 437 ft Goal status: INITIAL  Patient to demonstrate 5xSTS test in <15 sec in order to decrease risk of falls.  Baseline: 26.23 sec pushing off knees and audible R knee crepitus  Goal status: INITIAL   ASSESSMENT:  CLINICAL IMPRESSION: The patient's BP was improved today and she had more energy to participate in therapy. PT completed 6 minute walk test demonstrating significantly reduced distance compared to normative values (>1500 ft is normative). See updated goal. We also initiated seated HEP, as standing there ex fatigued her quickly. Plan to progress  to patient tolerance.   OBJECTIVE IMPAIRMENTS: Abnormal gait, cardiopulmonary status limiting activity, decreased activity tolerance, decreased balance, difficulty walking, decreased strength, and pain.   PLAN:  PT FREQUENCY: 1-2x/week  PT DURATION: 6 weeks  PLANNED INTERVENTIONS: 97164- PT Re-evaluation, 97110-Therapeutic exercises, 97530- Therapeutic activity, [id number]- Neuromuscular re-education, 97535- Self Care, 16109- Manual therapy, [phone]- Gait training, [phone]- Canalith repositioning, [phone]- Aquatic Therapy, Patient/Family education, Balance training, Stair training, Taping, Dry Needling, Vestibular training, DME instructions, Cryotherapy, and Moist heat  PLAN FOR NEXT SESSION: patient requests back exercises (sleeps in recliner and avoids laying flat), progress endurance while monitoring vitals, possibly using interval training , general conditioning and strengthening.

## 2023-11-28 ENCOUNTER — Ambulatory Visit: Admitting: Physical Therapy

## 2023-11-28 LAB — BASIC METABOLIC PANEL WITH GFR
BUN/Creatinine Ratio: 11 — ABNORMAL LOW (ref 12–28)
BUN: 11 mg/dL (ref 8–27)
CO2: 23 mmol/L (ref 20–29)
Calcium: 9.2 mg/dL (ref 8.7–10.3)
Chloride: 99 mmol/L (ref 96–106)
Creatinine, Ser: 1.02 mg/dL — ABNORMAL HIGH (ref 0.57–1.00)
Glucose: 104 mg/dL — ABNORMAL HIGH (ref 70–99)
Potassium: 5.1 mmol/L (ref 3.5–5.2)
Sodium: 138 mmol/L (ref 134–144)
eGFR: 56 mL/min/{1.73_m2} — ABNORMAL LOW (ref >59)

## 2023-11-28 NOTE — Telephone Encounter (Signed)
 Reather Littler D, NP  Cv Div Nl Triage1 minute ago (9:46 AM)    Please let Michelle Wilcox know that I reviewed her cardiac medications with our pharmacy team. She Wilcox take clindamycin prior to her cleaning. Thank you, Reather Littler, NP   Patient identification verified by 2 forms. Michelle Rail, RN    Called and spoke to patient  Relayed provider message  Patient verbalized understanding, no questions at this time

## 2023-11-29 ENCOUNTER — Telehealth: Payer: Self-pay | Admitting: Cardiology

## 2023-11-29 NOTE — Telephone Encounter (Signed)
 FMLA payment ($29) posted to pt's account.  JB, 11-29-23

## 2023-11-29 NOTE — Telephone Encounter (Signed)
 Completed FMLA forms faxed to [facility] form faxed. All forms scanned into pt's chart.  JB, 11-29-23

## 2023-12-02 ENCOUNTER — Encounter

## 2023-12-02 NOTE — Telephone Encounter (Signed)
-----   Message from Michelle Wilcox sent at 11/28/2023 10:40 AM EDT ----- Please let [name] know that her potassium is back in normal range. Her kidney function is stable, recommend she continue to stay well hydrated.

## 2023-12-02 NOTE — Telephone Encounter (Signed)
 Called patient advised of below they verbalized understanding.

## 2023-12-02 NOTE — Therapy (Signed)
 OUTPATIENT PHYSICAL THERAPY NEURO TREATMENT   Patient Name: Michelle Wilcox MRN: [id number] DOB:1945-10-17, 78 y.o., female Today's Date: 12/03/2023  PCP: Gweneth Dimitri, MD REFERRING PROVIDER: Lanae Boast, MD  END OF SESSION:  PT End of Session - 12/03/23 1616     Visit Number 4    Number of Visits 13    Date for PT Re-Evaluation 12/26/23    Authorization Type BCBS Medicare    PT Start Time 1532    PT Stop Time 1614    PT Time Calculation (min) 42 min    Equipment Utilized During Treatment Gait belt    Activity Tolerance Patient tolerated treatment well;Patient limited by pain    Behavior During Therapy WFL for tasks assessed/performed             Past Medical History:  Diagnosis Date   Anxiety    hx of   Asthma    at times   CHF (congestive heart failure) (HCC)    Depression    hx of   Dyspnea    Giant cell arteritis (HCC)    Gouty arthropathy    Hemolytic anemia (HCC)    Hyperlipidemia    Hypertension    Irregular heart beat    extra beat   Migraines    Polymyalgia (HCC)    Polymyalgia rheumatica (HCC)    Temporal arteritis (HCC)    Past Surgical History:  Procedure Laterality Date   BARTHOLIN GLAND CYST EXCISION     non ca   BREAST BIOPSY Right    CARDIOVERSION N/A 11/05/2023   Procedure: CARDIOVERSION;  Surgeon: Thomasene Ripple, DO;  [facility];  Service: Cardiovascular;  Laterality: N/A;   EYE MUSCLE SURGERY     as a child 78 yrs old   KNEE SURGERY  2004   rt knee   LAPAROSCOPIC SPLENECTOMY N/A 06/18/2018   Procedure: LAPAROSCOPIC SPLENECTOMY ERAS PATHWAY;  Surgeon: Glenna Fellows, MD;  Location: [facility];  Service: General;  Laterality: N/A;   toncil     TONSILLECTOMY     as a child   TRANSESOPHAGEAL ECHOCARDIOGRAM (CATH LAB) N/A 11/05/2023   Procedure: TRANSESOPHAGEAL ECHOCARDIOGRAM;  Surgeon: Thomasene Ripple, DO;  [facility];  Service: Cardiovascular;  Laterality: N/A;   Patient Active Problem List    Diagnosis Date Noted   Pericardial effusion 11/07/2023   Atrial fibrillation with RVR (HCC) 11/05/2023   Borderline abnormal TFTs 11/05/2023   Macrocytosis 11/05/2023   Atrial fibrillation (HCC) 11/04/2023   PMR (polymyalgia rheumatica) (HCC) 11/04/2023   Primary localized osteoarthrosis of multiple sites 06/15/2022   Diastolic dysfunction without heart failure 03/05/2018   DOE (dyspnea on exertion) 03/05/2018   Preop cardiovascular exam 03/05/2018   Mild reactive airways disease 08/04/2017   Volume overload 08/03/2017   Hypokalemia 08/03/2017   Essential hypertension 08/03/2017   Hyperlipidemia 08/03/2017   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   Hypersensitivity reaction 11/20/2016   Drusen (degenerative) of retina, bilateral 05/22/2016   Nuclear cataract 05/22/2016   Retinal hemorrhage of left eye 05/22/2016   Temporal arteritis (HCC) 05/22/2016   Hemolytic anemia (HCC) 03/19/2016   Breast mass, right 07/01/2012    ONSET DATE: 11/04/23  REFERRING DIAG: I48.91 (ICD-10-CM) - Atrial fibrillation with RVR (HCC)  THERAPY DIAG:  Difficulty in walking, not elsewhere classified  Other abnormalities of gait and mobility  Other symptoms and signs involving the musculoskeletal system  Rationale for Evaluation and Treatment: Rehabilitation  SUBJECTIVE:  SUBJECTIVE STATEMENT: "Pretty good but it varies day to day. Today I'm, feeling pretty fatigued." Had to use her cane earlier today and it tired her out.  Pt accompanied by: self  PERTINENT HISTORY: Anxiety, asthma, CHF, depression, gouty arthropathy, anemia, HLD, HTN, migraines, polymyalgia rheumatica, temporal arteritis, R knee surgery   PAIN:  Are you having pain? Yes: NPRS scale: "a little bit" Pain location: R clavicle Pain  description: sore Aggravating factors: previous fall, moving a certain way Relieving factors: voltaren  PRECAUTIONS: Fall  WEIGHT BEARING RESTRICTIONS: No  FALLS: Has patient fallen in last 6 months? Yes. Number of falls 1  LIVING ENVIRONMENT: Lives with: lives with their spouse who have stage 4 stomach CA (pt reports that she is having to physically assist him "some") Lives in: Other townhouse  Stairs: 2 stories, sleeps on 1st floor in recliner  Has following equipment at home: Environmental consultant - 2 wheeled, cane  PLOF: Independent; currently getting assistance with meals   PATIENT GOALS: improve fatigue   OBJECTIVE:     TODAY'S TREATMENT: 12/03/23 Activity Comments  Vitals at start of session  139/82 mmHg, 76 bpm , 96% spO2   back exercises: Supine pelvic tilts 10x LTR 10x  alt isometric hip flexion 2x3x5"  beginner bridge 2x5  open book stretch (discontinued d/t hip and shoulder pain) Wedge pillow + 2 pillows under head per pt's request. Verbal and manual cues to avoid straining and improve comfort. Cues to reduce to 50" effort with isometrics and focus on rhythmic breathing   Sitting prayer stretch with pball 5x3" Report of good relief  Vitals after mat ther-ex 94% spO2, 71 bpm  Gait training with RW  2 min ~133ft 89-95 % spO2, 79-81 bpm        HOME EXERCISE PROGRAM Last updated: 12/03/23 Access Code: [facility] URL: [email or web address] Date: 12/03/2023 Prepared by: [facility] - Outpatient  Rehab - [facility]  Exercises - Seated Bilateral Shoulder External Rotation with Resistance  - 1 x daily - 5 x weekly - 1 sets - 10 reps - Seated Heel Raise  - 1 x daily - 5 x weekly - 1 sets - 10 reps - Seated Long Arc Quad  - 1 x daily - 5 x weekly - 1 sets - 10 reps - Seated March  - 1 x daily - 5 x weekly - 1 sets - 10 reps - Seated Flexion Stretch with Swiss Ball  - 1 x daily - 5 x weekly - 2 sets - 5 reps - 3-5 sec hold - Supine Lower Trunk Rotation  - 1 x daily  - 5 x weekly - 2 sets - 10 reps - Supine Bridge  - 1 x daily - 5 x weekly - 2 sets - 10 reps   PATIENT EDUCATION: Education details: educated patient on Cardiac NP's recommendation " goal resting HR is less than 110bpm. goal heart rate is less than 135 bpm with exertion."; HEP update Person educated: Patient Education method: Explanation, Demonstration, Tactile cues, Verbal cues, and Handouts Education comprehension: verbalized understanding and returned demonstration   _________________________________________________________________________________________________ Note: Objective measures were completed at Evaluation unless otherwise noted.  Vitals: 96% spO2, 97 bpm (102 bpm taken manually),  115/85 mmHg  DIAGNOSTIC FINDINGS: 11/05/23 echo TEE: Left Atrium: Left atrial size was severely dilated. A left atrial/left  atrial appendage thrombus was detected.   COGNITION: Overall cognitive status: Within functional limits for tasks assessed   SENSATION: WFL  POSTURE: rounded shoulders and forward head  LOWER  EXTREMITY MMT:   MMT (in sitting) Right Eval Left Eval  Hip flexion 4+ 4+  Hip extension    Hip abduction 4+ 4+  Hip adduction 4+ 4+  Hip internal rotation    Hip external rotation    Knee flexion 4+ 4+  Knee extension 4+ 4  Ankle dorsiflexion 4+ 4+  Ankle plantarflexion 4 4  Ankle inversion    Ankle eversion    (Blank rows = not tested)  GAIT: Gait pattern: slow gait speed, pushing walker too far forward Assistive device utilized: Walker - 2 wheeled Level of assistance: SBA  FUNCTIONAL TESTS:  5xSTS: 26.23 sec pushing off knees and audible R knee crepitus  Pre: 97% spO2, 89bpm  Post: 95% spO2, 102bpm   87M walk test: 17.53 sec with RW (1.87 ft/sec)                                                                                                                 __________________________________________________________________________________ GOALS: Goals  reviewed with patient? Yes  SHORT TERM GOALS: Target date: 12/05/2023  Patient to be independent with initial HEP. Baseline: HEP initiated Goal status: INITIAL  LONG TERM GOALS: Target date: 12/26/2023  Patient to be independent with advanced HEP. Baseline: Not yet initiated  Goal status: INITIAL  Patient to demonstrate gait speed of at least 2.0 ft/sec in order to improve access to community.  Baseline: 1.87 ft/sec Goal status: INITIAL  Patient to report tolerance for grocery trip without fatigue limiting.    Baseline: unable Goal status: INITIAL  6 min walk test to improve to >600 ft with device as needed Baseline: 437 ft Goal status: INITIAL  Patient to demonstrate 5xSTS test in <15 sec in order to decrease risk of falls.  Baseline: 26.23 sec pushing off knees and audible R knee crepitus  Goal status: INITIAL   ASSESSMENT:  CLINICAL IMPRESSION: Patient arrived to session with report of increased fatigue today. Vitals at start of session were [facility]. Proceeded with gentle lumbar stretching and core strengthening per pt's request last session. Provided cueing for max comfort and proper form. Patient was not able to tolerate sidelying d/t hp and shoulder pain, thus these were avoided. Patient seems to have directional preference for flexion, as evidenced by c/o back pain lying flat in supine. Patient tolerated session well and without complaints upon leaving.   OBJECTIVE IMPAIRMENTS: Abnormal gait, cardiopulmonary status limiting activity, decreased activity tolerance, decreased balance, difficulty walking, decreased strength, and pain.   PLAN:  PT FREQUENCY: 1-2x/week  PT DURATION: 6 weeks  PLANNED INTERVENTIONS: 97164- PT Re-evaluation, 97110-Therapeutic exercises, 97530- Therapeutic activity, [id number]- Neuromuscular re-education, 97535- Self Care, [address]- Manual therapy, [id number]- Gait training, [phone]- Canalith repositioning, [phone]- Aquatic Therapy, Patient/Family education, Balance  training, Stair training, Taping, Dry Needling, Vestibular training, DME instructions, Cryotherapy, and Moist heat  PLAN FOR NEXT SESSION: progress endurance while monitoring vitals, possibly using interval training , general conditioning and strengthening.   Thaddeus Filippo, PT, DPT 12/03/23 4:17 PM  [facility]  [facility] [address], [address] [place], [place] [address] Phone # [phone] Fax # [phone]

## 2023-12-03 ENCOUNTER — Ambulatory Visit: Admitting: Physical Therapy

## 2023-12-03 ENCOUNTER — Encounter: Payer: Self-pay | Admitting: Physical Therapy

## 2023-12-03 DIAGNOSIS — R29898 Other symptoms and signs involving the musculoskeletal system: Secondary | ICD-10-CM

## 2023-12-03 DIAGNOSIS — R262 Difficulty in walking, not elsewhere classified: Secondary | ICD-10-CM

## 2023-12-03 DIAGNOSIS — R2689 Other abnormalities of gait and mobility: Secondary | ICD-10-CM

## 2023-12-03 DIAGNOSIS — M6281 Muscle weakness (generalized): Secondary | ICD-10-CM | POA: Diagnosis not present

## 2023-12-03 DIAGNOSIS — R293 Abnormal posture: Secondary | ICD-10-CM | POA: Diagnosis not present

## 2023-12-03 NOTE — Therapy (Signed)
 OUTPATIENT PHYSICAL THERAPY NEURO TREATMENT   Patient Name: Michelle Wilcox MRN: [id number] DOB:1945/08/29, 78 y.o., female Today's Date: 12/05/2023  PCP: Gweneth Dimitri, MD REFERRING PROVIDER: Lanae Boast, MD  END OF SESSION:  PT End of Session - 12/05/23 1212     Visit Number 5    Number of Visits 13    Date for PT Re-Evaluation 12/26/23    Authorization Type BCBS Medicare    PT Start Time 1151    PT Stop Time 1230    PT Time Calculation (min) 39 min    Equipment Utilized During Treatment Gait belt    Activity Tolerance Patient tolerated treatment well    Behavior During Therapy WFL for tasks assessed/performed             Past Medical History:  Diagnosis Date   Anxiety    hx of   Asthma    at times   CHF (congestive heart failure) (HCC)    Depression    hx of   Dyspnea    Giant cell arteritis (HCC)    Gouty arthropathy    Hemolytic anemia (HCC)    Hyperlipidemia    Hypertension    Irregular heart beat    extra beat   Migraines    Polymyalgia (HCC)    Polymyalgia rheumatica (HCC)    Temporal arteritis (HCC)    Past Surgical History:  Procedure Laterality Date   BARTHOLIN GLAND CYST EXCISION     non ca   BREAST BIOPSY Right    CARDIOVERSION N/A 11/05/2023   Procedure: CARDIOVERSION;  Surgeon: Thomasene Ripple, DO;  Location: [facility];  Service: Cardiovascular;  Laterality: N/A;   EYE MUSCLE SURGERY     as a child 64 yrs old   KNEE SURGERY  2004   rt knee   LAPAROSCOPIC SPLENECTOMY N/A 06/18/2018   Procedure: LAPAROSCOPIC SPLENECTOMY ERAS PATHWAY;  Surgeon: Glenna Fellows, MD;  Location: [facility];  Service: General;  Laterality: N/A;   toncil     TONSILLECTOMY     as a child   TRANSESOPHAGEAL ECHOCARDIOGRAM (CATH LAB) N/A 11/05/2023   Procedure: TRANSESOPHAGEAL ECHOCARDIOGRAM;  Surgeon: Thomasene Ripple, DO;  [facility]: [facility];  Service: Cardiovascular;  Laterality: N/A;   Patient Active Problem List   Diagnosis Date Noted    Pericardial effusion 11/07/2023   Atrial fibrillation with RVR (HCC) 11/05/2023   Borderline abnormal TFTs 11/05/2023   Macrocytosis 11/05/2023   Atrial fibrillation (HCC) 11/04/2023   PMR (polymyalgia rheumatica) (HCC) 11/04/2023   Primary localized osteoarthrosis of multiple sites 06/15/2022   Diastolic dysfunction without heart failure 03/05/2018   DOE (dyspnea on exertion) 03/05/2018   Preop cardiovascular exam 03/05/2018   Mild reactive airways disease 08/04/2017   Volume overload 08/03/2017   Hypokalemia 08/03/2017   Essential hypertension 08/03/2017   Hyperlipidemia 08/03/2017   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   Hypersensitivity reaction 11/20/2016   Drusen (degenerative) of retina, bilateral 05/22/2016   Nuclear cataract 05/22/2016   Retinal hemorrhage of left eye 05/22/2016   Temporal arteritis (HCC) 05/22/2016   Hemolytic anemia (HCC) 03/19/2016   Breast mass, right 07/01/2012    ONSET DATE: 11/04/23  REFERRING DIAG: I48.91 (ICD-10-CM) - Atrial fibrillation with RVR (HCC)  THERAPY DIAG:  Difficulty in walking, not elsewhere classified  Other abnormalities of gait and mobility  Other symptoms and signs involving the musculoskeletal system  Rationale for Evaluation and Treatment: Rehabilitation  SUBJECTIVE:  SUBJECTIVE STATEMENT: Nothing new.  Pt accompanied by: self  PERTINENT HISTORY: Anxiety, asthma, CHF, depression, gouty arthropathy, anemia, HLD, HTN, migraines, polymyalgia rheumatica, temporal arteritis, R knee surgery   PAIN:  Are you having pain? Yes: NPRS scale: "it's okay" Pain location: R knee Pain description: sore Aggravating factors: previous fall Relieving factors: knee brace  PRECAUTIONS: Fall; per Cardiac NP's recommendation: goal resting HR is  <110bpm. goal heart rate is <135 bpm with exertion.  WEIGHT BEARING RESTRICTIONS: No  FALLS: Has patient fallen in last 6 months? Yes. Number of falls 1  LIVING ENVIRONMENT: Lives with: lives with their spouse who has stage 4 stomach CA (pt reports that she is having to physically assist him "some") Lives in: Other townhouse  Stairs: 2 stories, sleeps on 1st floor in recliner  Has following equipment at home: Environmental consultant - 2 wheeled, cane  PLOF: Independent; currently getting assistance with meals   PATIENT GOALS: improve fatigue   OBJECTIVE:     TODAY'S TREATMENT: 12/05/23 Activity Comments  Vitals at start of session 96% spO2, 71 bpm   Nustep L4 x 6 min  Cueing to maintain 75-80SPM  Vitals checked every 2 min 66-74 bpm  Reported some R knee pain but able to proceed  standing at counter with 2# ankle weights:   superset: standing march 2x20 Sidestepping 2x 1 min  Required sitting rest break in between. C/o most difficulty with side stepping. Encouraged decreased pace for improved tolerance   78bpm   superset: STS 2x10  standing hip ABD 2x10 each LE C/o mild knee pain  Required sitting rest break in between 88 bpm     HOME EXERCISE PROGRAM Access Code: [facility] URL: [email or web address] Date: 12/05/2023 Prepared by: [facility] - Outpatient  Rehab - [facility]  Program Notes monitor your heart rate during exercise and make sure it does not go above 135 bpm  Exercises - Seated Bilateral Shoulder External Rotation with Resistance  - 1 x daily - 5 x weekly - 1 sets - 10 reps - Seated Flexion Stretch with Swiss Ball  - 1 x daily - 5 x weekly - 2 sets - 5 reps - 3-5 sec hold - Supine Lower Trunk Rotation  - 1 x daily - 5 x weekly - 2 sets - 10 reps - Supine Bridge  - 1 x daily - 5 x weekly - 2 sets - 10 reps - Side Stepping with Counter Support  - 1 x daily - 5 x weekly - 2-3 sets - 1 min hold - Standing March with Counter Support  - 1 x daily - 5 x  weekly - 2-3 sets - 20 reps - Sit to Stand with Arms Crossed  - 1 x daily - 5 x weekly - 2-3 sets - 10 reps - Standing Hip Abduction with Counter Support  - 1 x daily - 5 x weekly - 2-3 sets - 10 reps  PATIENT EDUCATION: Education details: HEP update and consolidation  Person educated: Patient Education method: Explanation, Demonstration, Tactile cues, Verbal cues, and Handouts Education comprehension: verbalized understanding and returned demonstration   _________________________________________________________________________________________________ Note: Objective measures were completed at Evaluation unless otherwise noted.  Vitals: 96% spO2, 97 bpm (102 bpm taken manually),  115/85 mmHg  DIAGNOSTIC FINDINGS: 11/05/23 echo TEE: Left Atrium: Left atrial size was severely dilated. A left atrial/left  atrial appendage thrombus was detected.   COGNITION: Overall cognitive status: Within functional limits for tasks assessed   SENSATION: WFL  POSTURE: rounded shoulders and  forward head  LOWER EXTREMITY MMT:   MMT (in sitting) Right Eval Left Eval  Hip flexion 4+ 4+  Hip extension    Hip abduction 4+ 4+  Hip adduction 4+ 4+  Hip internal rotation    Hip external rotation    Knee flexion 4+ 4+  Knee extension 4+ 4  Ankle dorsiflexion 4+ 4+  Ankle plantarflexion 4 4  Ankle inversion    Ankle eversion    (Blank rows = not tested)  GAIT: Gait pattern: slow gait speed, pushing walker too far forward Assistive device utilized: Walker - 2 wheeled Level of assistance: SBA  FUNCTIONAL TESTS:  5xSTS: 26.23 sec pushing off knees and audible R knee crepitus  Pre: 97% spO2, 89bpm  Post: 95% spO2, 102bpm   70M walk test: 17.53 sec with RW (1.87 ft/sec)                                                                                                                 __________________________________________________________________________________ GOALS: Goals reviewed with  patient? Yes  SHORT TERM GOALS: Target date: 12/05/2023  Patient to be independent with initial HEP. Baseline: HEP initiated Goal status: IN PROGRESS  LONG TERM GOALS: Target date: 12/26/2023  Patient to be independent with advanced HEP. Baseline: Not yet initiated  Goal status: IN PROGRESS  Patient to demonstrate gait speed of at least 2.0 ft/sec in order to improve access to community.  Baseline: 1.87 ft/sec Goal status: IN PROGRESS  Patient to report tolerance for grocery trip without fatigue limiting.    Baseline: unable Goal status: IN PROGRESS  6 min walk test to improve to >600 ft with device as needed Baseline: 437 ft Goal status: IN PROGRESS  Patient to demonstrate 5xSTS test in <15 sec in order to decrease risk of falls.  Baseline: 26.23 sec pushing off knees and audible R knee crepitus  Goal status: IN PROGRESS   ASSESSMENT:  CLINICAL IMPRESSION: Patient arrived to session without complaints. Vitals at start of session WNL. Proceeded with exercises focusing increasing exertional tolerance. Utilized supersets to optimize standing tolerance.  Patient c/o mild R knee pain occasionally but was able to proceed. Sitting rest breaks in between exercise sets was required d/t fatigue and HR was monitoring throughout to ensure normal exercise response. HEP was updated for max benefit- patient reported understanding and without complaints upon leaving.   OBJECTIVE IMPAIRMENTS: Abnormal gait, cardiopulmonary status limiting activity, decreased activity tolerance, decreased balance, difficulty walking, decreased strength, and pain.   PLAN:  PT FREQUENCY: 1-2x/week  PT DURATION: 6 weeks  PLANNED INTERVENTIONS: 97164- PT Re-evaluation, 97110-Therapeutic exercises, 97530- Therapeutic activity, [id number]- Neuromuscular re-education, 97535- Self Care, [address]- Manual therapy, [id number]- Gait training, [phone]- Canalith repositioning, [phone]- Aquatic Therapy, Patient/Family education, Balance  training, Stair training, Taping, Dry Needling, Vestibular training, DME instructions, Cryotherapy, and Moist heat  PLAN FOR NEXT SESSION: pt requests exercises for balance; progress endurance while monitoring vitals, possibly using interval training , general conditioning and strengthening.   Thaddeus Filippo, PT, DPT 12/05/23 12:32  PM  [facility] at [facility] [address] [place], [address] [place], [place] [address] Phone # [phone] Fax # [phone]

## 2023-12-04 ENCOUNTER — Telehealth: Payer: Self-pay | Admitting: Cardiology

## 2023-12-04 NOTE — Telephone Encounter (Signed)
 Additional FMLA forms received [employer] - scanned into pt's chart & copies given to [facility] D. for [employer].  Cyndra Dress, 12-04-23

## 2023-12-04 NOTE — Telephone Encounter (Signed)
 Updated FMLA forms faxed to [facility] & scanned into patient's chart.  JB, 12-04-23

## 2023-12-05 ENCOUNTER — Encounter: Payer: Self-pay | Admitting: Physical Therapy

## 2023-12-05 ENCOUNTER — Telehealth: Payer: Self-pay | Admitting: Cardiology

## 2023-12-05 ENCOUNTER — Ambulatory Visit: Admitting: Physical Therapy

## 2023-12-05 DIAGNOSIS — M6281 Muscle weakness (generalized): Secondary | ICD-10-CM | POA: Diagnosis not present

## 2023-12-05 DIAGNOSIS — R262 Difficulty in walking, not elsewhere classified: Secondary | ICD-10-CM

## 2023-12-05 DIAGNOSIS — R2689 Other abnormalities of gait and mobility: Secondary | ICD-10-CM

## 2023-12-05 DIAGNOSIS — R29898 Other symptoms and signs involving the musculoskeletal system: Secondary | ICD-10-CM

## 2023-12-05 DIAGNOSIS — R293 Abnormal posture: Secondary | ICD-10-CM | POA: Diagnosis not present

## 2023-12-05 MED ORDER — DIGOXIN 125 MCG PO TABS: 0.1250 mg | ORAL_TABLET | Freq: Every day | ORAL | 3 refills | Status: DC

## 2023-12-05 NOTE — Telephone Encounter (Signed)
 Pt calling for lab results.

## 2023-12-05 NOTE — Telephone Encounter (Signed)
 Pt c/o medication issue:  1. Name of Medication: amiodarone (PACERONE) 200 MG tablet   2. How are you currently taking this medication (dosage and times per day)? N/A  3. Are you having a reaction (difficulty breathing--STAT)? No  4. What is your medication issue? Pt said she didn't follow the instruction correctly and would like a c/b

## 2023-12-05 NOTE — Telephone Encounter (Signed)
 Pt's medication was sent to pt's pharmacy as requested. Confirmation received.

## 2023-12-05 NOTE — Telephone Encounter (Signed)
 Spoke to patient she was calling to report she did not follow Amiodarone instructions correctly.Stated she was suppose to take 200 mg twice a day for 14 days instead she took 200 mg twice a day for 20 days.She is now taking 200 mg daily.Advised that should not be a problem.I will make Dr.Harding and [name] NP aware.

## 2023-12-05 NOTE — Telephone Encounter (Signed)
*  STAT* If patient is at the pharmacy, call can be transferred to refill team.   1. Which medications need to be refilled? (please list name of each medication and dose if known) digoxin (LANOXIN) 0.125 MG tablet    2. Would you like to learn more about the convenience, safety, & potential cost savings by using the [facility]? No   3. Are you open to using the [facility] (Type [facility].  ). No   4. Which pharmacy/location (including street and city if local pharmacy) is medication to be sent to?  [id number] - [place], [place] - [address]    5. Do they need a 30 day or 90 day supply? 30 day

## 2023-12-05 NOTE — Telephone Encounter (Signed)
 Spoke to patient recent lab results given.Advised to continue all medications and stay well hydrated.

## 2023-12-09 ENCOUNTER — Ambulatory Visit

## 2023-12-09 NOTE — Progress Notes (Unsigned)
 Cardiology Office Note    Date:  12/12/2023  ID:  Michelle Wilcox, Begeman 07-13-1946, MRN [id number] PCP:  Michelle Lobstein, MD  Cardiologist:  Michelle Bustard, MD  Electrophysiologist:  None   Chief Complaint: Follow up for persistent atrial fibrillation   History of Present Illness: .   Michelle Wilcox is a 78 y.o. female with visit-pertinent history of polymyalgia rheumatica, temporal arteritis, diastolic dysfunction and atrial fibrillation.  Patient presented to ED on 11/04/2023 with new onset atrial fibrillation characterized by rapid ventricular response, heart rate in the 150s.  Patient described a fluttering sensation in her chest.  Atrial fibrillation was identified following a cortisone injection in her knee.  In hospital patient reported shortness of breath, dizziness and palpitations for more than a few days, though exact duration was unclear.  She denied any recent chest pain.  Patient was planned for TEE/DCCV.  However TEE showed decreased EF of 40 to 45% and left atrial appendage thrombus so DCCV was unable to be performed.  Given decrease in EF diltiazem  was discontinued, was unable to add p.o. Lopressor  as blood pressures were soft.  Patient was loaded on digoxin .  An attempt for better rate control diltiazem  was started however this resulted in an episode of symptomatic hypotension and a rapid response with fluid bolus.  Patient was started on IV amiodarone  with goal of rate control only.  Patient was discharged on 11/07/2023 on metoprolol  succinate 50 mg twice daily, digoxin  0.125 mg daily and Eliquis .  Patient was started on amiodarone  400 mg twice daily for 5 days followed by 200 mg twice daily for 2 weeks followed by 200 mg daily thereafter.  Patient was seen in clinic on 11/19/2023.  She reported that she was doing well overall, reported that her heart rate had been well-controlled at home.  She reported some intermittent low blood pressures, specifically when she was at  PT earlier that week.  Noted that she had not been hydrating very well, noted improvement with increased hydration.  Today she presents for follow-up.  She reports that she is having a hard day, notes she is having significant anxiety today. She reports that her heart rate at home has been well controlled.  She reports that her blood pressures have not been as low, actually had some higher blood pressures this morning that improved, likely related to increased anxiety she notes.  She denies any chest pain, shortness of breath, lower extremity edema, orthopnea or PND.  She denies any presyncope or syncope.  Labwork independently reviewed: 11/27/23: Sodium 138, potassium 5.1, creatinine 1.02, digoxin  0.9 ROS: .   Today she denies chest pain, shortness of breath, lower extremity edema, fatigue, palpitations, melena, hematuria, hemoptysis, diaphoresis, weakness, presyncope, syncope, orthopnea, and PND.  All other systems are reviewed and otherwise negative. Studies Reviewed: Michelle Wilcox   EKG:  EKG is ordered today, personally reviewed, demonstrating  EKG Interpretation Date/Time:  Thursday December 12 2023 08:45:54 EDT Ventricular Rate:  75 PR Interval:    QRS Duration:  88 QT Interval:  354 QTC Calculation: 395 R Axis:   1  Text Interpretation: Atrial fibrillation Low voltage QRS Nonspecific ST and T wave abnormality When compared with ECG of 19-Nov-2023 15:02, No significant change was found Confirmed by Michelle Wilcox [phone]) on 12/12/2023 8:47:34 AM   CV Studies: Cardiac studies reviewed are outlined and summarized above. Otherwise please see EMR for full report. Cardiac Studies & Procedures   ______________________________________________________________________________________________   STRESS TESTS  MYOCARDIAL PERFUSION IMAGING  03/11/2018  Narrative  The left ventricular ejection fraction is hyperdynamic (>65%).  Nuclear stress EF: 69%.  There was no ST segment deviation noted during stress.   The study is normal.  This is a low risk study.  Low risk stress nuclear study with normal perfusion and normal left ventricular regional and global systolic function.   ECHOCARDIOGRAM  ECHOCARDIOGRAM COMPLETE 08/04/2017  Narrative ** *[facility]* [address] [place], [place] [address] [phone]  ------------------------------------------------------------------- Transthoracic Echocardiography  Patient:    Michelle, Wilcox MR #:       [id number] Study Date: 08/04/2017 Gender:     F Age:        71 Height:     162.6 cm Weight:     89.7 kg BSA:        2.05 m^2 Pt. Status: Room:       [address]    Michelle Wilcox PERFORMING   [facility], Inpatient SONOGRAPHER  Joleen Navy ADMITTING    Danice Dural ORDERING     Danice Dural REFERRING    Danice Dural  cc:  ------------------------------------------------------------------- LV EF: 60% -   65%  ------------------------------------------------------------------- Indications:      Dyspnea 786.09.  ------------------------------------------------------------------- History:   Risk factors:  Hypertension. Dyslipidemia.  ------------------------------------------------------------------- Study Conclusions  - Left ventricle: The cavity size was normal. Wall thickness was normal. Systolic function was normal. The estimated ejection fraction was in the range of 60% to 65%. Wall motion was normal; there were no regional wall motion abnormalities. Doppler parameters are consistent with abnormal left ventricular relaxation (grade 1 diastolic dysfunction). - Aortic valve: There was no stenosis. There was mild regurgitation. - Mitral valve: There was mild regurgitation. - Left atrium: The atrium was mildly dilated. - Right ventricle: The cavity size was normal. Systolic function was normal. - Tricuspid valve: Peak RV-RA gradient (S): 35 mm Hg. -  Pulmonary arteries: PA peak pressure: 43 mm Hg (S). - Systemic veins: IVC measured 2.3 cm with > 50% respirophasic variation, suggesting RA pressure 8 mmHg.  Impressions:  - Normal LV size with EF 60-65%. Normal RV size and systolic function. Mild mitral regurgitation. Mild pulmonary hypertension.  ------------------------------------------------------------------- Study data:  No prior study was available for comparison.  Study status:  Routine.  Procedure:  The patient reported no pain pre or post test. Transthoracic echocardiography. Image quality was adequate.  Study completion:  There were no complications. Transthoracic echocardiography.  M-mode, complete 2D, spectral Doppler, and color Doppler.  Birthdate:  Patient birthdate: 09/16/1945.  Age:  Patient is 78 yr old.  Sex:  Gender: female. BMI: 33.9 kg/m^2.  Blood pressure:     119/66  Patient status: Inpatient.  Study date:  Study date: 08/04/2017. Study time: 10:08 AM.  Location:  Bedside.  -------------------------------------------------------------------  ------------------------------------------------------------------- Left ventricle:  The cavity size was normal. Wall thickness was normal. Systolic function was normal. The estimated ejection fraction was in the range of 60% to 65%. Wall motion was normal; there were no regional wall motion abnormalities. Doppler parameters are consistent with abnormal left ventricular relaxation (grade 1 diastolic dysfunction).  ------------------------------------------------------------------- Aortic valve:   Trileaflet.  Doppler:   There was no stenosis. There was mild regurgitation.  ------------------------------------------------------------------- Aorta:  Aortic root: The aortic root was normal in size. Ascending aorta: The ascending aorta was normal in size.  ------------------------------------------------------------------- Mitral valve:   Normal thickness leaflets .   Doppler:   There was no evidence for stenosis.  There was mild regurgitation.    Peak gradient (D): 3 mm Hg.  ------------------------------------------------------------------- Left atrium:  The atrium was mildly dilated.  ------------------------------------------------------------------- Right ventricle:  The cavity size was normal. Systolic function was normal.  ------------------------------------------------------------------- Pulmonic valve:    Structurally normal valve.   Cusp separation was normal.  Doppler:  Transvalvular velocity was within the normal range. There was no regurgitation.  ------------------------------------------------------------------- Tricuspid valve:   Doppler:  There was trivial regurgitation.  ------------------------------------------------------------------- Right atrium:  The atrium was normal in size.  ------------------------------------------------------------------- Pericardium:  There was no pericardial effusion.  ------------------------------------------------------------------- Systemic veins:  IVC measured 2.3 cm with > 50% respirophasic variation, suggesting RA pressure 8 mmHg.  ------------------------------------------------------------------- Measurements  Left ventricle                         Value        Reference LV ID, ED, PLAX chordal                52    mm     43 - 52 LV ID, ES, PLAX chordal                28.5  mm     23 - 38 LV fx shortening, PLAX chordal         45    %      >=29 LV PW thickness, ED                    10.9  mm     ---------- IVS/LV PW ratio, ED                    0.89         <=1.3 Stroke volume, 2D                      79    ml     ---------- Stroke volume/bsa, 2D                  39    ml/m^2 ---------- LV e&', lateral                         9.25  cm/s   ---------- LV E/e&', lateral                       8.65         ---------- LV e&', medial                          7.83  cm/s   ---------- LV  E/e&', medial                        10.22        ---------- LV e&', average                         8.54  cm/s   ---------- LV E/e&', average                       9.37         ----------  Ventricular septum                     Value  Reference IVS thickness, ED                      9.68  mm     ----------  LVOT                                   Value        Reference LVOT ID, S                             20    mm     ---------- LVOT area                              3.14  cm^2   ---------- LVOT peak velocity, S                  117   cm/s   ---------- LVOT mean velocity, S                  73.2  cm/s   ---------- LVOT VTI, S                            25.1  cm     ---------- LVOT peak gradient, S                  5     mm Hg  ----------  Aorta                                  Value        Reference Aortic root ID, ED                     28    mm     ----------  Left atrium                            Value        Reference LA ID, A-P, ES                         44    mm     ---------- LA ID/bsa, A-P                         2.15  cm/m^2 <=2.2 LA volume, S                           64    ml     ---------- LA volume/bsa, S                       31.2  ml/m^2 ---------- LA volume, ES, 1-p A4C                 84.1  ml     ---------- LA volume/bsa, ES, 1-p A4C             41    ml/m^2 ---------- LA volume, ES, 1-p A2C  42.1  ml     ---------- LA volume/bsa, ES, 1-p A2C             20.5  ml/m^2 ----------  Mitral valve                           Value        Reference Mitral E-wave peak velocity            80    cm/s   ---------- Mitral A-wave peak velocity            85.9  cm/s   ---------- Mitral deceleration time               201   ms     150 - 230 Mitral peak gradient, D                3     mm Hg  ---------- Mitral E/A ratio, peak                 0.9          ----------  Pulmonary arteries                     Value        Reference PA pressure, S, DP              (H)     43    mm Hg  <=30  Tricuspid valve                        Value        Reference Tricuspid regurg peak velocity         294   cm/s   ---------- Tricuspid peak RV-RA gradient          35    mm Hg  ----------  Right atrium                           Value        Reference RA ID, S-I, ES, A4C            (H)     52.5  mm     34 - 49 RA area, ES, A4C                       12.9  cm^2   8.3 - 19.5 RA volume, ES, A/L                     26.6  ml     ---------- RA volume/bsa, ES, A/L                 13    ml/m^2 ----------  Systemic veins                         Value        Reference Estimated CVP                          8     mm Hg  ----------  Right ventricle                        Value  Reference TAPSE                                  21.2  mm     ---------- RV s&', lateral, S                      16.6  cm/s   ----------  Legend: (L)  and  (H)  mark values outside specified reference range.  ------------------------------------------------------------------- Prepared and Electronically Authenticated by  Peder Bourdon, M.D. 2018-12-16T13:15:25   TEE  ECHO TEE 11/05/2023  Narrative TRANSESOPHOGEAL ECHO REPORT    Patient Name:   SHANTE ARCHAMBEAULT Date of Exam: 11/05/2023 Medical Rec #:  [id number]               Height:       64.0 in Accession #:    [id number]              Weight:       187.0 lb Date of Birth:  09/26/1945               BSA:          1.901 m Patient Age:    77 years                BP:           141/110 mmHg Patient Gender: F                       HR:           126 bpm. Exam Location:  Inpatient  Procedure: Cardiac Doppler, Color Doppler and Transesophageal Echo (Both Spectral and Color Flow Doppler were utilized during procedure).  Indications:     Cardioversion  History:         Patient has no prior history of Echocardiogram examinations.  Sonographer:     Hersey Lorenzo RDCS Referring Phys:  [id number] RONAL[address] BERGE Diagnosing  Phys: Kardie Tobb DO  PROCEDURE: The transesophogeal probe was passed without difficulty through the esophogus of the patient. Sedation performed by different physician. The patient developed no complications during the procedure.  IMPRESSIONS   1. Left ventricular ejection fraction, by estimation, is 40 to 45%. The left ventricle has mildly decreased function. The left ventricle demonstrates global hypokinesis. 2. Right ventricular systolic function is mildly reduced. The right ventricular size is mildly enlarged. 3. Left atrial size was severely dilated. A left atrial/left atrial appendage thrombus was detected. 4. Small to moderate. The pericardial effusion is circumferential. There is no evidence of cardiac tamponade. 5. The mitral valve is abnormal. Mild to moderate mitral valve regurgitation. 6. The aortic valve is tricuspid. Aortic valve regurgitation is mild to moderate. No aortic stenosis is present. 7. There is borderline dilatation of the ascending aorta, measuring 36 mm. There is mild (Grade II) layered plaque involving the descending aorta.  FINDINGS Left Ventricle: Left ventricular ejection fraction, by estimation, is 40 to 45%. The left ventricle has mildly decreased function. The left ventricle demonstrates global hypokinesis. The left ventricular internal cavity size was normal in size.  Right Ventricle: The right ventricular size is mildly enlarged. Right vetricular wall thickness was not well visualized. Right ventricular systolic function is mildly reduced.  Left Atrium: Left atrial size was severely dilated. A left atrial/left atrial appendage thrombus was detected.  Pericardium: Small to moderate. The pericardial effusion is circumferential.  There is no evidence of cardiac tamponade.  Mitral Valve: The mitral valve is abnormal. Mild to moderate mitral valve regurgitation.  Tricuspid Valve: The tricuspid valve is normal in structure. Tricuspid valve regurgitation is  mild.  Aortic Valve: The aortic valve is tricuspid. Aortic valve regurgitation is mild to moderate. No aortic stenosis is present.  Pulmonic Valve: The pulmonic valve was not well visualized. Pulmonic valve regurgitation is trivial. No evidence of pulmonic stenosis.  Aorta: There is borderline dilatation of the ascending aorta, measuring 36 mm. There is mild (Grade II) layered plaque involving the descending aorta.  Pulmonary Artery: The pulmonary artery is not well seen.  Venous: The left upper pulmonary vein, left lower pulmonary vein and right upper pulmonary vein are normal.    AORTA Ao Asc diam: 3.60 cm  Kardie Tobb DO Electronically signed by Jerryl Morin DO Signature Date/Time: 11/05/2023/5:08:44 PM    Final        ______________________________________________________________________________________________       Current Reported Medications:.    Current Meds  Medication Sig   acetaminophen  (TYLENOL ) 650 MG CR tablet Take 1,300 mg by mouth 2 (two) times daily.   amiodarone  (PACERONE ) 200 MG tablet Take 2 tablets (400 mg total) by mouth 2 (two) times daily for 3 days, THEN 1 tablet (200 mg total) 2 (two) times daily for 14 days, THEN 1 tablet (200 mg total) daily.   apixaban  (ELIQUIS ) 5 MG TABS tablet Take 1 tablet (5 mg total) by mouth 2 (two) times daily.   clindamycin (CLEOCIN) 150 MG capsule SMARTSIG:4 Capsule(s) By Mouth   digoxin  (LANOXIN ) 0.125 MG tablet Take 1 tablet (0.125 mg total) by mouth daily.   folic acid  (FOLVITE ) 1 MG tablet TAKE 1 TABLET(1 MG) BY MOUTH DAILY   furosemide  (LASIX ) 40 MG tablet Take 20 mg by mouth every evening.   gabapentin  (NEURONTIN ) 100 MG capsule Take 2 capsules (200 mg total) by mouth 2 (two) times daily.   lactase (LACTAID) 3000 units tablet Take 9,000-15,000 Units by mouth as needed (consuming dairy products).   loratadine  (CLARITIN ) 10 MG tablet Take 10 mg by mouth daily.   metoprolol  succinate (TOPROL -XL) 50 MG 24 hr tablet  Take 1 tablet (50 mg total) by mouth 2 (two) times daily. Take with or immediately following a meal.   Multiple Vitamin (MULTIVITAMIN) tablet Take 1 tablet by mouth every evening.    predniSONE  (DELTASONE ) 5 MG tablet Take 1 tablet (5 mg total) by mouth daily with breakfast.   Probiotic Product (ALIGN) 4 MG CAPS Take 4 mg by mouth every morning.   rosuvastatin  (CRESTOR ) 5 MG tablet Take 1 tablet by mouth every other day.   Vitamin D3 (VITAMIN D) 25 MCG tablet Take 1,000 Units by mouth every other day.   Physical Exam:    VS:  BP 120/64   Pulse 75   Ht 5\' 3"  (1.6 m)   Wt 184 lb (83.5 kg)   SpO2 96%   BMI 32.59 kg/m    Wt Readings from Last 3 Encounters:  12/12/23 184 lb (83.5 kg)  11/19/23 181 lb 3.2 oz (82.2 kg)  11/04/23 187 lb (84.8 kg)    GEN: Well nourished, well developed in no acute distress NECK: No JVD; No carotid bruits CARDIAC: Irregular RR, no murmurs, rubs, gallops RESPIRATORY:  Clear to auscultation without rales, wheezing or rhonchi  ABDOMEN: Soft, non-tender, non-distended EXTREMITIES:  No edema; No acute deformity     Asessement and Plan:.    Persistent atrial  fibrillation/LAA clot: Patient presented to ED on 11/04/2023 with elevated heart rate, felt likely to be triggered by recent cortisone injection in the knee.  Symptoms included dyspnea, dizziness, palpitations near syncope.  Patient went for TEE/DCCV on 3/18 however unfortunately TEE showed a left atrial appendage thrombus so DCCV was unable to be performed.  TEE also showed LVEF of 40 to 45%.  Focus in recent weeks being rate control. Today she reports that she is doing okay, notes that her heart rate at home has been well-controlled.  Discussed plan for TEE/DCCV, patient in agreement, reports adherence with Eliquis . Will plan for TEE/DCCV. Check CBC and bmet today. Continue Eliquis  5 mg twice daily, digoxin  0.125 mg daily, metoprolol  succinate 50 mg twice daily and amiodarone . Patient scheduled for TEE guided DCCV  on May 1st at 11:00 with Dr. Paulita Boss.  Informed Consent   Shared Decision Making/Informed Consent   The risks [stroke, cardiac arrhythmias rarely resulting in the need for a temporary or permanent pacemaker, skin irritation or burns, esophageal damage, perforation (1:10,000 risk), bleeding, pharyngeal hematoma as well as other potential complications associated with conscious sedation including aspiration, arrhythmia, respiratory failure and death], benefits (treatment guidance, restoration of normal sinus rhythm, diagnostic support) and alternatives of a transesophageal echocardiogram guided cardioversion were discussed in detail with Michelle Wilcox and she is willing to proceed.      HFrEF: TEE showed LVEF of 40 to 45% with global hypokinesis, mildly reduced RV function, severe left atrial enlargement and small to moderate pericardial effusion with no evidence of tamponade.  GDMT limited due to soft blood pressures.  It was felt that her cardiomyopathy was likely tachycardia mediated.  Today she appears euvolemic and well compensated on exam.  She denies any shortness of breath, lower extremity edema, orthopnea or PND.  Will plan to repeat echo in 2 to 3 months after restoration of sinus rhythm and optimization of GDMT.  Would consider ischemic evaluation if no improvement in EF.  Hypertension: Blood pressure today 120/64.  Patient notes that her blood pressures have not been as low, notes some improvement.  She reports that her blood pressure was high this morning after she was climbing the stairs, improved with relaxation.  She notes that she does have significant anxiety today.  Encouraged patient to continue monitoring her blood pressures at home.  Hyperlipidemia: Continue Crestor  5 mg every other day.    Disposition: F/u with Daylon Lafavor, NP 2-3 weeks following cardioversion.   Signed, Monserat Prestigiacomo D Owenn Rothermel, NP

## 2023-12-09 NOTE — Therapy (Signed)
 OUTPATIENT PHYSICAL THERAPY NEURO TREATMENT   Patient Name: Michelle Wilcox MRN: [id number] DOB:02-27-1946, 78 y.o., female Today's Date: 12/10/2023  PCP: [name], MD REFERRING PROVIDER: Lesa Rape, MD  END OF SESSION:  PT End of Session - 12/10/23 0907     Visit Number 6    Number of Visits 13    Date for PT Re-Evaluation 12/26/23    Authorization Type BCBS Medicare    PT Start Time 0845    PT Stop Time 0927    PT Time Calculation (min) 42 min    Equipment Utilized During Treatment Gait belt    Activity Tolerance Patient tolerated treatment well;Patient limited by fatigue    Behavior During Therapy WFL for tasks assessed/performed              Past Medical History:  Diagnosis Date   Anxiety    hx of   Asthma    at times   CHF (congestive heart failure) (HCC)    Depression    hx of   Dyspnea    Giant cell arteritis (HCC)    Gouty arthropathy    Hemolytic anemia (HCC)    Hyperlipidemia    Hypertension    Irregular heart beat    extra beat   Migraines    Polymyalgia (HCC)    Polymyalgia rheumatica (HCC)    Temporal arteritis (HCC)    Past Surgical History:  Procedure Laterality Date   BARTHOLIN GLAND CYST EXCISION     non ca   BREAST BIOPSY Right    CARDIOVERSION N/A 11/05/2023   Procedure: CARDIOVERSION;  Surgeon: Jerryl Morin, DO;  Location: [facility];  Service: Cardiovascular;  Laterality: N/A;   EYE MUSCLE SURGERY     as a child 45 yrs old   KNEE SURGERY  2004   rt knee   LAPAROSCOPIC SPLENECTOMY N/A 06/18/2018   Procedure: LAPAROSCOPIC SPLENECTOMY ERAS PATHWAY;  Surgeon: Ayesha Lente, MD;  Location: [facility];  Service: General;  Laterality: N/A;   toncil     TONSILLECTOMY     as a child   TRANSESOPHAGEAL ECHOCARDIOGRAM (CATH LAB) N/A 11/05/2023   Procedure: TRANSESOPHAGEAL ECHOCARDIOGRAM;  Surgeon: Jerryl Morin, DO;  Location: [facility];  Service: Cardiovascular;  Laterality: N/A;   Patient Active Problem  List   Diagnosis Date Noted   Pericardial effusion 11/07/2023   Atrial fibrillation with RVR (HCC) 11/05/2023   Borderline abnormal TFTs 11/05/2023   Macrocytosis 11/05/2023   Atrial fibrillation (HCC) 11/04/2023   PMR (polymyalgia rheumatica) (HCC) 11/04/2023   Primary localized osteoarthrosis of multiple sites 06/15/2022   Diastolic dysfunction without heart failure 03/05/2018   DOE (dyspnea on exertion) 03/05/2018   Preop cardiovascular exam 03/05/2018   Mild reactive airways disease 08/04/2017   Volume overload 08/03/2017   Hypokalemia 08/03/2017   Essential hypertension 08/03/2017   Hyperlipidemia 08/03/2017   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   Hypersensitivity reaction 11/20/2016   Drusen (degenerative) of retina, bilateral 05/22/2016   Nuclear cataract 05/22/2016   Retinal hemorrhage of left eye 05/22/2016   Temporal arteritis (HCC) 05/22/2016   Hemolytic anemia (HCC) 03/19/2016   Breast mass, right 07/01/2012    ONSET DATE: 11/04/23  REFERRING DIAG: I48.91 (ICD-10-CM) - Atrial fibrillation with RVR (HCC)  THERAPY DIAG:  Difficulty in walking, not elsewhere classified  Other abnormalities of gait and mobility  Other symptoms and signs involving the musculoskeletal system  Muscle weakness (generalized)  Rationale for Evaluation and Treatment: Rehabilitation  SUBJECTIVE:  SUBJECTIVE STATEMENT: "Okay" Noticing that her knee has some swelling on the side. Reports yesterday night her systolic BP was 155 but it went to by this AM. Patient reports concerning HEP "haven't done them a lot" but denies concerns.  Pt accompanied by: self  PERTINENT HISTORY: Anxiety, asthma, CHF, depression, gouty arthropathy, anemia, HLD, HTN, migraines, polymyalgia rheumatica, temporal  arteritis, R knee surgery   PAIN:  Are you having pain? Yes: NPRS scale: "0" Pain location: R knee Pain description: sore Aggravating factors: previous fall Relieving factors: knee brace  PRECAUTIONS: Fall; per Cardiac NP's recommendation: goal resting HR is <110bpm. goal heart rate is <135 bpm with exertion.  WEIGHT BEARING RESTRICTIONS: No  FALLS: Has patient fallen in last 6 months? Yes. Number of falls 1  LIVING ENVIRONMENT: Lives with: lives with their spouse who has stage 4 stomach CA (pt reports that she is having to physically assist him "some") Lives in: Other townhouse  Stairs: 2 stories, sleeps on 1st floor in recliner  Has following equipment at home: Environmental consultant - 2 wheeled, cane  PLOF: Independent; currently getting assistance with meals   PATIENT GOALS: improve fatigue   OBJECTIVE:     TODAY'S TREATMENT: 12/10/23 Activity Comments  Vitals at start of session 95% 63 bpm   Nustep L4 x 6 min B UEs/LEs  93% 75 bpm   at counter, performing supersets for additional endurance challenge:   fwd/back stepping 2x1 min walking with EC 2x1 min Weaning UE support; cues for longer posterior step. Standing rest break taken.   Pt very apprehensive with EC. CGA provided   96% 85bpm. Sitting rest break after completing both sets. 67bpm after rest break  backwards walking 2x1 min sidestepping over cones 2x1 min Required UE support. Sitting rest break required after 1st set   95% 91 bpm after 2nd sets   romberg EC + head turns/nods 2x30 sec alt toe tap on cone x1 min 93% 89 bpm     PATIENT EDUCATION: Education details: encouraged ice and elevation to the knee address edema Person educated: Patient Education method: Explanation Education comprehension: verbalized understanding   HOME EXERCISE PROGRAM Access Code: [facility] URL: [email or web address] Date: 12/05/2023 Prepared by: [facility] - Outpatient  Rehab - [facility]  Program Notes monitor your  heart rate during exercise and make sure it does not go above 135 bpm  Exercises - Seated Bilateral Shoulder External Rotation with Resistance  - 1 x daily - 5 x weekly - 1 sets - 10 reps - Seated Flexion Stretch with Swiss Ball  - 1 x daily - 5 x weekly - 2 sets - 5 reps - 3-5 sec hold - Supine Lower Trunk Rotation  - 1 x daily - 5 x weekly - 2 sets - 10 reps - Supine Bridge  - 1 x daily - 5 x weekly - 2 sets - 10 reps - Side Stepping with Counter Support  - 1 x daily - 5 x weekly - 2-3 sets - 1 min hold - Standing March with Counter Support  - 1 x daily - 5 x weekly - 2-3 sets - 20 reps - Sit to Stand with Arms Crossed  - 1 x daily - 5 x weekly - 2-3 sets - 10 reps - Standing Hip Abduction with Counter Support  - 1 x daily - 5 x weekly - 2-3 sets - 10 reps   _________________________________________________________________________________________________ Note: Objective measures were completed at Evaluation unless otherwise noted.  Vitals: 96% spO2, 97 bpm (  102 bpm taken manually),  115/85 mmHg  DIAGNOSTIC FINDINGS: 11/05/23 echo TEE: Left At[facility]: Left atrial size was severely dilated. A left atrial/left  atrial appendage thrombus was detected.   COGNITION: Overall cognitive status: Within functional limits for tasks assessed   SENSATION: WFL  POSTURE: rounded shoulders and forward head  LOWER EXTREMITY MMT:   MMT (in sitting) Right Eval Left Eval  Hip flexion 4+ 4+  Hip extension    Hip abduction 4+ 4+  Hip adduction 4+ 4+  Hip internal rotation    Hip external rotation    Knee flexion 4+ 4+  Knee extension 4+ 4  Ankle dorsiflexion 4+ 4+  Ankle plantarflexion 4 4  Ankle inversion    Ankle eversion    (Blank rows = not tested)  GAIT: Gait pattern: slow gait speed, pushing walker too far forward Assistive device utilized: Walker - 2 wheeled Level of assistance: SBA  FUNCTIONAL TESTS:  5xSTS: 26.23 sec pushing off knees and audible R knee crepitus  Pre: 97% spO2,  89bpm  Post: 95% spO2, 102bpm   43M walk test: 17.53 sec with RW (1.87 ft/sec)                                                                                                                 __________________________________________________________________________________ GOALS: Goals reviewed with patient? Yes  SHORT TERM GOALS: Target date: 12/05/2023  Patient to be independent with initial HEP. Baseline: HEP initiated Goal status: MET  LONG TERM GOALS: Target date: 12/26/2023  Patient to be independent with advanced HEP. Baseline: Not yet initiated  Goal status: IN PROGRESS  Patient to demonstrate gait speed of at least 2.0 ft/sec in order to improve access to community.  Baseline: 1.87 ft/sec Goal status: IN PROGRESS  Patient to report tolerance for grocery trip without fatigue limiting.    Baseline: unable Goal status: IN PROGRESS  6 min walk test to improve to >600 ft with device as needed Baseline: 437 ft Goal status: IN PROGRESS  Patient to demonstrate 5xSTS test in <15 sec in order to decrease risk of falls.  Baseline: 26.23 sec pushing off knees and audible R knee crepitus  Goal status: IN PROGRESS   ASSESSMENT:  CLINICAL IMPRESSION: Patient arrived to session with report of some R knee edema. Educated pt on techniques to address this. Proceeded with standing activities for balance and exertional challenge. Continued monitoring vitals to ensure normal exercise response. Patient particularly apprehensive with EC balance, required UE support for stability for several activities, and reported increased challenge by 2nd set of exercises d/t fatigue. Standing or sitting rest breaks taken as needed d/t c/o fatigue. Patient tolerated session well and without complaints upon leaving.   OBJECTIVE IMPAIRMENTS: Abnormal gait, cardiopulmonary status limiting activity, decreased activity tolerance, decreased balance, difficulty walking, decreased strength, and pain.    PLAN:  PT FREQUENCY: 1-2x/week  PT DURATION: 6 weeks  PLANNED INTERVENTIONS: [id number]- PT Re-evaluation, 97110-Therapeutic exercises, 97530- Therapeutic activity, [id number]- Neuromuscular re-education, 97535- Self Care, [id number]- Manual therapy, [id number]- Gait  training, 86578- Canalith repositioning, [id number]- Aquatic Therapy, Patient/Family education, Balance training, Stair training, Taping, Dry Needling, Vestibular training, DME instructions, Cryotherapy, and Moist heat  PLAN FOR NEXT SESSION: progress endurance while monitoring vitals, possibly using interval training , general conditioning and strengthening.   Thaddeus Filippo, PT, DPT 12/10/23 9:27 AM  [facility] at [facility] [address] [place], [address] [place], [place] [address] Phone # [phone] Fax # [phone]

## 2023-12-09 NOTE — H&P (View-Only) (Signed)
 Cardiology Office Note    Date:  12/12/2023  ID:  Michelle, Wilcox 07-13-1946, MRN [id number] PCP:  Michelle Lobstein, MD  Cardiologist:  Michelle Bustard, MD  Electrophysiologist:  None   Chief Complaint: Follow up for persistent atrial fibrillation   History of Present Illness: .   Michelle Wilcox is a 78 y.o. female with visit-pertinent history of polymyalgia rheumatica, temporal arteritis, diastolic dysfunction and atrial fibrillation.  Patient presented to ED on 11/04/2023 with new onset atrial fibrillation characterized by rapid ventricular response, heart rate in the 150s.  Patient described a fluttering sensation in her chest.  Atrial fibrillation was identified following a cortisone injection in her knee.  In hospital patient reported shortness of breath, dizziness and palpitations for more than a few days, though exact duration was unclear.  She denied any recent chest pain.  Patient was planned for TEE/DCCV.  However TEE showed decreased EF of 40 to 45% and left atrial appendage thrombus so DCCV was unable to be performed.  Given decrease in EF diltiazem  was discontinued, was unable to add p.o. Lopressor  as blood pressures were soft.  Patient was loaded on digoxin .  An attempt for better rate control diltiazem  was started however this resulted in an episode of symptomatic hypotension and a rapid response with fluid bolus.  Patient was started on IV amiodarone  with goal of rate control only.  Patient was discharged on 11/07/2023 on metoprolol  succinate 50 mg twice daily, digoxin  0.125 mg daily and Eliquis .  Patient was started on amiodarone  400 mg twice daily for 5 days followed by 200 mg twice daily for 2 weeks followed by 200 mg daily thereafter.  Patient was seen in clinic on 11/19/2023.  She reported that she was doing well overall, reported that her heart rate had been well-controlled at home.  She reported some intermittent low blood pressures, specifically when she was at  PT earlier that week.  Noted that she had not been hydrating very well, noted improvement with increased hydration.  Today she presents for follow-up.  She reports that she is having a hard day, notes she is having significant anxiety today. She reports that her heart rate at home has been well controlled.  She reports that her blood pressures have not been as low, actually had some higher blood pressures this morning that improved, likely related to increased anxiety she notes.  She denies any chest pain, shortness of breath, lower extremity edema, orthopnea or PND.  She denies any presyncope or syncope.  Labwork independently reviewed: 11/27/23: Sodium 138, potassium 5.1, creatinine 1.02, digoxin  0.9 ROS: .   Today she denies chest pain, shortness of breath, lower extremity edema, fatigue, palpitations, melena, hematuria, hemoptysis, diaphoresis, weakness, presyncope, syncope, orthopnea, and PND.  All other systems are reviewed and otherwise negative. Studies Reviewed: Michelle Wilcox   EKG:  EKG is ordered today, personally reviewed, demonstrating  EKG Interpretation Date/Time:  Thursday December 12 2023 08:45:54 EDT Ventricular Rate:  75 PR Interval:    QRS Duration:  88 QT Interval:  354 QTC Calculation: 395 R Axis:   1  Text Interpretation: Atrial fibrillation Low voltage QRS Nonspecific ST and T wave abnormality When compared with ECG of 19-Nov-2023 15:02, No significant change was found Confirmed by Michelle Wilcox [phone]) on 12/12/2023 8:47:34 AM   CV Studies: Cardiac studies reviewed are outlined and summarized above. Otherwise please see EMR for full report. Cardiac Studies & Procedures   ______________________________________________________________________________________________   STRESS TESTS  MYOCARDIAL PERFUSION IMAGING  03/11/2018  Narrative  The left ventricular ejection fraction is hyperdynamic (>65%).  Nuclear stress EF: 69%.  There was no ST segment deviation noted during stress.   The study is normal.  This is a low risk study.  Low risk stress nuclear study with normal perfusion and normal left ventricular regional and global systolic function.   ECHOCARDIOGRAM  ECHOCARDIOGRAM COMPLETE 08/04/2017  Narrative ** *[facility]* [address] [place], [place] [address] [phone]  ------------------------------------------------------------------- Transthoracic Echocardiography  Patient:    Michelle, Wilcox MR #:       [id number] Study Date: 08/04/2017 Gender:     F Age:        71 Height:     162.6 cm Weight:     89.7 kg BSA:        2.05 m^2 Pt. Status: Room:       [address]    Michelle Wilcox [id number] PERFORMING   [facility], Inpatient SONOGRAPHER  Michelle Wilcox ADMITTING    Michelle Wilcox ORDERING     Michelle Wilcox REFERRING    Michelle Wilcox  cc:  ------------------------------------------------------------------- LV EF: 60% -   65%  ------------------------------------------------------------------- Indications:      Dyspnea 786.09.  ------------------------------------------------------------------- History:   Risk factors:  Hypertension. Dyslipidemia.  ------------------------------------------------------------------- Study Conclusions  - Left ventricle: The cavity size was normal. Wall thickness was normal. Systolic function was normal. The estimated ejection fraction was in the range of 60% to 65%. Wall motion was normal; there were no regional wall motion abnormalities. Doppler parameters are consistent with abnormal left ventricular relaxation (grade 1 diastolic dysfunction). - Aortic valve: There was no stenosis. There was mild regurgitation. - Mitral valve: There was mild regurgitation. - Left atrium: The atrium was mildly dilated. - Right ventricle: The cavity size was normal. Systolic function was normal. - Tricuspid valve: Peak RV-RA gradient (S): 35 mm Hg. -  Pulmonary arteries: PA peak pressure: 43 mm Hg (S). - Systemic veins: IVC measured 2.3 cm with > 50% respirophasic variation, suggesting RA pressure 8 mmHg.  Impressions:  - Normal LV size with EF 60-65%. Normal RV size and systolic function. Mild mitral regurgitation. Mild pulmonary hypertension.  ------------------------------------------------------------------- Study data:  No prior study was available for comparison.  Study status:  Routine.  Procedure:  The patient reported no pain pre or post test. Transthoracic echocardiography. Image quality was adequate.  Study completion:  There were no complications. Transthoracic echocardiography.  M-mode, complete 2D, spectral Doppler, and color Doppler.  Birthdate:  Patient birthdate: 09/16/1945.  Age:  Patient is 78 yr old.  Sex:  Gender: female. BMI: 33.9 kg/m^2.  Blood pressure:     119/66  Patient status: Inpatient.  Study date:  Study date: 08/04/2017. Study time: 10:08 AM.  Location:  Bedside.  -------------------------------------------------------------------  ------------------------------------------------------------------- Left ventricle:  The cavity size was normal. Wall thickness was normal. Systolic function was normal. The estimated ejection fraction was in the range of 60% to 65%. Wall motion was normal; there were no regional wall motion abnormalities. Doppler parameters are consistent with abnormal left ventricular relaxation (grade 1 diastolic dysfunction).  ------------------------------------------------------------------- Aortic valve:   Trileaflet.  Doppler:   There was no stenosis. There was mild regurgitation.  ------------------------------------------------------------------- Aorta:  Aortic root: The aortic root was normal in size. Ascending aorta: The ascending aorta was normal in size.  ------------------------------------------------------------------- Mitral valve:   Normal thickness leaflets .   Doppler:   There was no evidence for stenosis.  There was mild regurgitation.    Peak gradient (D): 3 mm Hg.  ------------------------------------------------------------------- Left atrium:  The atrium was mildly dilated.  ------------------------------------------------------------------- Right ventricle:  The cavity size was normal. Systolic function was normal.  ------------------------------------------------------------------- Pulmonic valve:    Structurally normal valve.   Cusp separation was normal.  Doppler:  Transvalvular velocity was within the normal range. There was no regurgitation.  ------------------------------------------------------------------- Tricuspid valve:   Doppler:  There was trivial regurgitation.  ------------------------------------------------------------------- Right atrium:  The atrium was normal in size.  ------------------------------------------------------------------- Pericardium:  There was no pericardial effusion.  ------------------------------------------------------------------- Systemic veins:  IVC measured 2.3 cm with > 50% respirophasic variation, suggesting RA pressure 8 mmHg.  ------------------------------------------------------------------- Measurements  Left ventricle                         Value        Reference LV ID, ED, PLAX chordal                52    mm     43 - 52 LV ID, ES, PLAX chordal                28.5  mm     23 - 38 LV fx shortening, PLAX chordal         45    %      >=29 LV PW thickness, ED                    10.9  mm     ---------- IVS/LV PW ratio, ED                    0.89         <=1.3 Stroke volume, 2D                      79    ml     ---------- Stroke volume/bsa, 2D                  39    ml/m^2 ---------- LV e&', lateral                         9.25  cm/s   ---------- LV E/e&', lateral                       8.65         ---------- LV e&', medial                          7.83  cm/s   ---------- LV  E/e&', medial                        10.22        ---------- LV e&', average                         8.54  cm/s   ---------- LV E/e&', average                       9.37         ----------  Ventricular septum                     Value  Reference IVS thickness, ED                      9.68  mm     ----------  LVOT                                   Value        Reference LVOT ID, S                             20    mm     ---------- LVOT area                              3.14  cm^2   ---------- LVOT peak velocity, S                  117   cm/s   ---------- LVOT mean velocity, S                  73.2  cm/s   ---------- LVOT VTI, S                            25.1  cm     ---------- LVOT peak gradient, S                  5     mm Hg  ----------  Aorta                                  Value        Reference Aortic root ID, ED                     28    mm     ----------  Left atrium                            Value        Reference LA ID, A-P, ES                         44    mm     ---------- LA ID/bsa, A-P                         2.15  cm/m^2 <=2.2 LA volume, S                           64    ml     ---------- LA volume/bsa, S                       31.2  ml/m^2 ---------- LA volume, ES, 1-p A4C                 84.1  ml     ---------- LA volume/bsa, ES, 1-p A4C             41    ml/m^2 ---------- LA volume, ES, 1-p A2C  42.1  ml     ---------- LA volume/bsa, ES, 1-p A2C             20.5  ml/m^2 ----------  Mitral valve                           Value        Reference Mitral E-wave peak velocity            80    cm/s   ---------- Mitral A-wave peak velocity            85.9  cm/s   ---------- Mitral deceleration time               201   ms     150 - 230 Mitral peak gradient, D                3     mm Hg  ---------- Mitral E/A ratio, peak                 0.9          ----------  Pulmonary arteries                     Value        Reference PA pressure, S, DP              (H)     43    mm Hg  <=30  Tricuspid valve                        Value        Reference Tricuspid regurg peak velocity         294   cm/s   ---------- Tricuspid peak RV-RA gradient          35    mm Hg  ----------  Right atrium                           Value        Reference RA ID, S-I, ES, A4C            (H)     52.5  mm     34 - 49 RA area, ES, A4C                       12.9  cm^2   8.3 - 19.5 RA volume, ES, A/L                     26.6  ml     ---------- RA volume/bsa, ES, A/L                 13    ml/m^2 ----------  Systemic veins                         Value        Reference Estimated CVP                          8     mm Hg  ----------  Right ventricle                        Value  Reference TAPSE                                  21.2  mm     ---------- RV s&', lateral, S                      16.6  cm/s   ----------  Legend: (L)  and  (H)  mark values outside specified reference range.  ------------------------------------------------------------------- Prepared and Electronically Authenticated by  Peder Bourdon, M.D. 2018-12-16T13:15:25   TEE  ECHO TEE 11/05/2023  Narrative TRANSESOPHOGEAL ECHO REPORT    Patient Name:   SHANTE ARCHAMBEAULT Date of Exam: 11/05/2023 Medical Rec #:  [id number]               Height:       64.0 in Accession #:    [id number]              Weight:       187.0 lb Date of Birth:  09/26/1945               BSA:          1.901 m Patient Age:    77 years                BP:           141/110 mmHg Patient Gender: F                       HR:           126 bpm. Exam Location:  Inpatient  Procedure: Cardiac Doppler, Color Doppler and Transesophageal Echo (Both Spectral and Color Flow Doppler were utilized during procedure).  Indications:     Cardioversion  History:         Patient has no prior history of Echocardiogram examinations.  Sonographer:     Hersey Lorenzo RDCS Referring Phys:  [id number] CHRI[address] RONALD BERGE Diagnosing  Phys: Kardie Tobb DO  PROCEDURE: The transesophogeal probe was passed without difficulty through the esophogus of the patient. Sedation performed by different physician. The patient developed no complications during the procedure.  IMPRESSIONS   1. Left ventricular ejection fraction, by estimation, is 40 to 45%. The left ventricle has mildly decreased function. The left ventricle demonstrates global hypokinesis. 2. Right ventricular systolic function is mildly reduced. The right ventricular size is mildly enlarged. 3. Left atrial size was severely dilated. A left atrial/left atrial appendage thrombus was detected. 4. Small to moderate. The pericardial effusion is circumferential. There is no evidence of cardiac tamponade. 5. The mitral valve is abnormal. Mild to moderate mitral valve regurgitation. 6. The aortic valve is tricuspid. Aortic valve regurgitation is mild to moderate. No aortic stenosis is present. 7. There is borderline dilatation of the ascending aorta, measuring 36 mm. There is mild (Grade II) layered plaque involving the descending aorta.  FINDINGS Left Ventricle: Left ventricular ejection fraction, by estimation, is 40 to 45%. The left ventricle has mildly decreased function. The left ventricle demonstrates global hypokinesis. The left ventricular internal cavity size was normal in size.  Right Ventricle: The right ventricular size is mildly enlarged. Right vetricular wall thickness was not well visualized. Right ventricular systolic function is mildly reduced.  Left Atrium: Left atrial size was severely dilated. A left atrial/left atrial appendage thrombus was detected.  Pericardium: Small to moderate. The pericardial effusion is circumferential.  There is no evidence of cardiac tamponade.  Mitral Valve: The mitral valve is abnormal. Mild to moderate mitral valve regurgitation.  Tricuspid Valve: The tricuspid valve is normal in structure. Tricuspid valve regurgitation is  mild.  Aortic Valve: The aortic valve is tricuspid. Aortic valve regurgitation is mild to moderate. No aortic stenosis is present.  Pulmonic Valve: The pulmonic valve was not well visualized. Pulmonic valve regurgitation is trivial. No evidence of pulmonic stenosis.  Aorta: There is borderline dilatation of the ascending aorta, measuring 36 mm. There is mild (Grade II) layered plaque involving the descending aorta.  Pulmonary Artery: The pulmonary artery is not well seen.  Venous: The left upper pulmonary vein, left lower pulmonary vein and right upper pulmonary vein are normal.    AORTA Ao Asc diam: 3.60 cm  Kardie Tobb DO Electronically signed by Jerryl Morin DO Signature Date/Time: 11/05/2023/5:08:44 PM    Final        ______________________________________________________________________________________________       Current Reported Medications:.    Current Meds  Medication Sig   acetaminophen  (TYLENOL ) 650 MG CR tablet Take 1,300 mg by mouth 2 (two) times daily.   amiodarone  (PACERONE ) 200 MG tablet Take 2 tablets (400 mg total) by mouth 2 (two) times daily for 3 days, THEN 1 tablet (200 mg total) 2 (two) times daily for 14 days, THEN 1 tablet (200 mg total) daily.   apixaban  (ELIQUIS ) 5 MG TABS tablet Take 1 tablet (5 mg total) by mouth 2 (two) times daily.   clindamycin (CLEOCIN) 150 MG capsule SMARTSIG:4 Capsule(s) By Mouth   digoxin  (LANOXIN ) 0.125 MG tablet Take 1 tablet (0.125 mg total) by mouth daily.   folic acid  (FOLVITE ) 1 MG tablet TAKE 1 TABLET(1 MG) BY MOUTH DAILY   furosemide  (LASIX ) 40 MG tablet Take 20 mg by mouth every evening.   gabapentin  (NEURONTIN ) 100 MG capsule Take 2 capsules (200 mg total) by mouth 2 (two) times daily.   lactase (LACTAID) 3000 units tablet Take 9,000-15,000 Units by mouth as needed (consuming dairy products).   loratadine  (CLARITIN ) 10 MG tablet Take 10 mg by mouth daily.   metoprolol  succinate (TOPROL -XL) 50 MG 24 hr tablet  Take 1 tablet (50 mg total) by mouth 2 (two) times daily. Take with or immediately following a meal.   Multiple Vitamin (MULTIVITAMIN) tablet Take 1 tablet by mouth every evening.    predniSONE  (DELTASONE ) 5 MG tablet Take 1 tablet (5 mg total) by mouth daily with breakfast.   Probiotic Product (ALIGN) 4 MG CAPS Take 4 mg by mouth every morning.   rosuvastatin  (CRESTOR ) 5 MG tablet Take 1 tablet by mouth every other day.   Vitamin D3 (VITAMIN D) 25 MCG tablet Take 1,000 Units by mouth every other day.   Physical Exam:    VS:  BP 120/64   Pulse 75   Ht 5\' 3"  (1.6 m)   Wt 184 lb (83.5 kg)   SpO2 96%   BMI 32.59 kg/m    Wt Readings from Last 3 Encounters:  12/12/23 184 lb (83.5 kg)  11/19/23 181 lb 3.2 oz (82.2 kg)  11/04/23 187 lb (84.8 kg)    GEN: Well nourished, well developed in no acute distress NECK: No JVD; No carotid bruits CARDIAC: Irregular RR, no murmurs, rubs, gallops RESPIRATORY:  Clear to auscultation without rales, wheezing or rhonchi  ABDOMEN: Soft, non-tender, non-distended EXTREMITIES:  No edema; No acute deformity     Asessement and Plan:.    Persistent atrial  fibrillation/LAA clot: Patient presented to ED on 11/04/2023 with elevated heart rate, felt likely to be triggered by recent cortisone injection in the knee.  Symptoms included dyspnea, dizziness, palpitations near syncope.  Patient went for TEE/DCCV on 3/18 however unfortunately TEE showed a left atrial appendage thrombus so DCCV was unable to be performed.  TEE also showed LVEF of 40 to 45%.  Focus in recent weeks being rate control. Today she reports that she is doing okay, notes that her heart rate at home has been well-controlled.  Discussed plan for TEE/DCCV, patient in agreement, reports adherence with Eliquis . Will plan for TEE/DCCV. Check CBC and bmet today. Continue Eliquis  5 mg twice daily, digoxin  0.125 mg daily, metoprolol  succinate 50 mg twice daily and amiodarone . Patient scheduled for TEE guided DCCV  on May 1st at 11:00 with Dr. Paulita Boss.  Informed Consent   Shared Decision Making/Informed Consent   The risks [stroke, cardiac arrhythmias rarely resulting in the need for a temporary or permanent pacemaker, skin irritation or burns, esophageal damage, perforation (1:10,000 risk), bleeding, pharyngeal hematoma as well as other potential complications associated with conscious sedation including aspiration, arrhythmia, respiratory failure and death], benefits (treatment guidance, restoration of normal sinus rhythm, diagnostic support) and alternatives of a transesophageal echocardiogram guided cardioversion were discussed in detail with Ms. Lalli and she is willing to proceed.      HFrEF: TEE showed LVEF of 40 to 45% with global hypokinesis, mildly reduced RV function, severe left atrial enlargement and small to moderate pericardial effusion with no evidence of tamponade.  GDMT limited due to soft blood pressures.  It was felt that her cardiomyopathy was likely tachycardia mediated.  Today she appears euvolemic and well compensated on exam.  She denies any shortness of breath, lower extremity edema, orthopnea or PND.  Will plan to repeat echo in 2 to 3 months after restoration of sinus rhythm and optimization of GDMT.  Would consider ischemic evaluation if no improvement in EF.  Hypertension: Blood pressure today 120/64.  Patient notes that her blood pressures have not been as low, notes some improvement.  She reports that her blood pressure was high this morning after she was climbing the stairs, improved with relaxation.  She notes that she does have significant anxiety today.  Encouraged patient to continue monitoring her blood pressures at home.  Hyperlipidemia: Continue Crestor  5 mg every other day.    Disposition: F/u with Daylon Lafavor, NP 2-3 weeks following cardioversion.   Signed, Monserat Prestigiacomo D Owenn Rothermel, NP

## 2023-12-10 ENCOUNTER — Encounter: Payer: Self-pay | Admitting: Physical Therapy

## 2023-12-10 ENCOUNTER — Ambulatory Visit: Admitting: Physical Therapy

## 2023-12-10 DIAGNOSIS — R2689 Other abnormalities of gait and mobility: Secondary | ICD-10-CM

## 2023-12-10 DIAGNOSIS — R293 Abnormal posture: Secondary | ICD-10-CM | POA: Diagnosis not present

## 2023-12-10 DIAGNOSIS — M6281 Muscle weakness (generalized): Secondary | ICD-10-CM | POA: Diagnosis not present

## 2023-12-10 DIAGNOSIS — R29898 Other symptoms and signs involving the musculoskeletal system: Secondary | ICD-10-CM

## 2023-12-10 DIAGNOSIS — R262 Difficulty in walking, not elsewhere classified: Secondary | ICD-10-CM | POA: Diagnosis not present

## 2023-12-11 NOTE — Therapy (Signed)
 OUTPATIENT PHYSICAL THERAPY NEURO TREATMENT   Patient Name: Michelle Wilcox MRN: [id number] DOB:[birth year], 78 y.o., female Today's Date: 12/12/2023  PCP: [name], MD REFERRING PROVIDER: Lesa Rape, MD  END OF SESSION:  PT End of Session - 12/12/23 1613     Visit Number 7    Number of Visits 13    Date for PT Re-Evaluation 12/26/23    Authorization Type BCBS Medicare    PT Start Time 1537    PT Stop Time 1616    PT Time Calculation (min) 39 min    Activity Tolerance Patient tolerated treatment well;Patient limited by fatigue    Behavior During Therapy WFL for tasks assessed/performed               Past Medical History:  Diagnosis Date   Anxiety    hx of   Asthma    at times   CHF (congestive heart failure) (HCC)    Depression    hx of   Dyspnea    Giant cell arteritis (HCC)    Gouty arthropathy    Hemolytic anemia (HCC)    Hyperlipidemia    Hypertension    Irregular heart beat    extra beat   Migraines    Polymyalgia (HCC)    Polymyalgia rheumatica (HCC)    Temporal arteritis (HCC)    Past Surgical History:  Procedure Laterality Date   BARTHOLIN GLAND CYST EXCISION     non ca   BREAST BIOPSY Right    CARDIOVERSION N/A 11/05/2023   Procedure: CARDIOVERSION;  Surgeon: Jerryl Morin, DO;  Location: [facility];  Service: Cardiovascular;  Laterality: N/A;   EYE MUSCLE SURGERY     as a child 67 yrs old   KNEE SURGERY  2004   rt knee   LAPAROSCOPIC SPLENECTOMY N/A 06/18/2018   Procedure: LAPAROSCOPIC SPLENECTOMY ERAS PATHWAY;  Surgeon: Ayesha Lente, MD;  Location: [facility];  Service: General;  Laterality: N/A;   toncil     TONSILLECTOMY     as a child   TRANSESOPHAGEAL ECHOCARDIOGRAM (CATH LAB) N/A 11/05/2023   Procedure: TRANSESOPHAGEAL ECHOCARDIOGRAM;  Surgeon: Jerryl Morin, DO;  Location: [facility];  Service: Cardiovascular;  Laterality: N/A;   Patient Active Problem List   Diagnosis Date Noted   Pericardial  effusion 11/07/2023   Atrial fibrillation with RVR (HCC) 11/05/2023   Borderline abnormal TFTs 11/05/2023   Macrocytosis 11/05/2023   Atrial fibrillation (HCC) 11/04/2023   PMR (polymyalgia rheumatica) (HCC) 11/04/2023   Primary localized osteoarthrosis of multiple sites 06/15/2022   Diastolic dysfunction without heart failure 03/05/2018   DOE (dyspnea on exertion) 03/05/2018   Preop cardiovascular exam 03/05/2018   Mild reactive airways disease 08/04/2017   Volume overload 08/03/2017   Hypokalemia 08/03/2017   Essential hypertension 08/03/2017   Hyperlipidemia 08/03/2017   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   Hypersensitivity reaction 11/20/2016   Drusen (degenerative) of retina, bilateral 05/22/2016   Nuclear cataract 05/22/2016   Retinal hemorrhage of left eye 05/22/2016   Temporal arteritis (HCC) 05/22/2016   Hemolytic anemia (HCC) 03/19/2016   Breast mass, right 07/01/2012    ONSET DATE: 11/04/23  REFERRING DIAG: I48.91 (ICD-10-CM) - Atrial fibrillation with RVR (HCC)  THERAPY DIAG:  Difficulty in walking, not elsewhere classified  Other abnormalities of gait and mobility  Other symptoms and signs involving the musculoskeletal system  Muscle weakness (generalized)  Rationale for Evaluation and Treatment: Rehabilitation  SUBJECTIVE:  SUBJECTIVE STATEMENT: You might want to check my BP today, it's been very high. Admits that she had a lot of anxiety about her cardioversion. Had a panic attack at the doctor's office earlier.  Pt accompanied by: self  PERTINENT HISTORY: Anxiety, asthma, CHF, depression, gouty arthropathy, anemia, HLD, HTN, migraines, polymyalgia rheumatica, temporal arteritis, R knee surgery   PAIN:  Are you having pain? Yes: NPRS scale: "no" Pain location:  R knee Pain description: sore Aggravating factors: previous fall Relieving factors: knee brace  PRECAUTIONS: Fall; per Cardiac NP's recommendation: goal resting HR is <110bpm. goal heart rate is <135 bpm with exertion.  WEIGHT BEARING RESTRICTIONS: No  FALLS: Has patient fallen in last 6 months? Yes. Number of falls 1  LIVING ENVIRONMENT: Lives with: lives with their spouse who has stage 4 stomach CA (pt reports that she is having to physically assist him "some") Lives in: Other townhouse  Stairs: 2 stories, sleeps on 1st floor in recliner  Has following equipment at home: Environmental consultant - 2 wheeled, cane  PLOF: Independent; currently getting assistance with meals   PATIENT GOALS: improve fatigue   OBJECTIVE:    TODAY'S TREATMENT: 12/12/23 Activity Comments  Vitals at start of session  93%, 78bpm, 134/84 mmHg   nustep L5 x 6 min UEs/LEs  94% 72 bpm   at counter, performing supersets for additional endurance challenge:    romberg EC + head turns/nods 2x30" each  alt toe tap on cone 2x1 min  9095% 72-85bpm  Sitting rest break between sets   STS 2x10  standing HS curl 3# x10 93-95% 93-105bpm      HOME EXERCISE PROGRAM Access Code: [facility] URL: [email or web address] Date: 12/05/2023 Prepared by: [facility] - Outpatient  Rehab - [facility]  Program Notes monitor your heart rate during exercise and make sure it does not go above 135 bpm  Exercises - Seated Bilateral Shoulder External Rotation with Resistance  - 1 x daily - 5 x weekly - 1 sets - 10 reps - Seated Flexion Stretch with Swiss Ball  - 1 x daily - 5 x weekly - 2 sets - 5 reps - 3-5 sec hold - Supine Lower Trunk Rotation  - 1 x daily - 5 x weekly - 2 sets - 10 reps - Supine Bridge  - 1 x daily - 5 x weekly - 2 sets - 10 reps - Side Stepping with Counter Support  - 1 x daily - 5 x weekly - 2-3 sets - 1 min hold - Standing March with Counter Support  - 1 x daily - 5 x weekly - 2-3 sets - 20 reps - Sit  to Stand with Arms Crossed  - 1 x daily - 5 x weekly - 2-3 sets - 10 reps - Standing Hip Abduction with Counter Support  - 1 x daily - 5 x weekly - 2-3 sets - 10 reps   _________________________________________________________________________________________________ Note: Objective measures were completed at Evaluation unless otherwise noted.  Vitals: 96% spO2, 97 bpm (102 bpm taken manually),  115/85 mmHg  DIAGNOSTIC FINDINGS: 11/05/23 echo TEE: Left Atrium: Left atrial size was severely dilated. A left atrial/left  atrial appendage thrombus was detected.   COGNITION: Overall cognitive status: Within functional limits for tasks assessed   SENSATION: WFL  POSTURE: rounded shoulders and forward head  LOWER EXTREMITY MMT:   MMT (in sitting) Right Eval Left Eval  Hip flexion 4+ 4+  Hip extension    Hip abduction 4+ 4+  Hip adduction 4+  4+  Hip internal rotation    Hip external rotation    Knee flexion 4+ 4+  Knee extension 4+ 4  Ankle dorsiflexion 4+ 4+  Ankle plantarflexion 4 4  Ankle inversion    Ankle eversion    (Blank rows = not tested)  GAIT: Gait pattern: slow gait speed, pushing walker too far forward Assistive device utilized: Walker - 2 wheeled Level of assistance: SBA  FUNCTIONAL TESTS:  5xSTS: 26.23 sec pushing off knees and audible R knee crepitus  Pre: 97% spO2, 89bpm  Post: 95% spO2, 102bpm   61M walk test: 17.53 sec with RW (1.87 ft/sec)                                                                                                                 __________________________________________________________________________________ GOALS: Goals reviewed with patient? Yes  SHORT TERM GOALS: Target date: 12/05/2023  Patient to be independent with initial HEP. Baseline: HEP initiated Goal status: MET  LONG TERM GOALS: Target date: 12/26/2023  Patient to be independent with advanced HEP. Baseline: Not yet initiated  Goal status: IN PROGRESS  Patient  to demonstrate gait speed of at least 2.0 ft/sec in order to improve access to community.  Baseline: 1.87 ft/sec Goal status: IN PROGRESS  Patient to report tolerance for grocery trip without fatigue limiting.    Baseline: unable Goal status: IN PROGRESS  6 min walk test to improve to >600 ft with device as needed Baseline: 437 ft Goal status: IN PROGRESS  Patient to demonstrate 5xSTS test in <15 sec in order to decrease risk of falls.  Baseline: 26.23 sec pushing off knees and audible R knee crepitus  Goal status: IN PROGRESS   ASSESSMENT:  CLINICAL IMPRESSION: Patient arrived to session with concerns about her BP. Upon discussion, this is likely d/t pt's anxiety surrounding upcoming cardioversion. Vitals at start of session were WNL, thus proceeded with session while monitoring symptoms. Patient performed supersets of standing dynamic balance and LE strengthening activities. Occasional cueing provided for form. Patient still requires sitting rest breaks in between sets to allow for vitals monitoring. Patient tolerated session well and without complaints upon leaving.   OBJECTIVE IMPAIRMENTS: Abnormal gait, cardiopulmonary status limiting activity, decreased activity tolerance, decreased balance, difficulty walking, decreased strength, and pain.   PLAN:  PT FREQUENCY: 1-2x/week  PT DURATION: 6 weeks  PLANNED INTERVENTIONS: 97164- PT Re-evaluation, 97110-Therapeutic exercises, 97530- Therapeutic activity, [id number]- Neuromuscular re-education, 97535- Self Care, 82956- Manual therapy, [id number]- Gait training, [phone]- Canalith repositioning, [phone]- Aquatic Therapy, Patient/Family education, Balance training, Stair training, Taping, Dry Needling, Vestibular training, DME instructions, Cryotherapy, and Moist heat  PLAN FOR NEXT SESSION: progress endurance while monitoring vitals, possibly using interval training , general conditioning and strengthening.   Thaddeus Filippo, PT,  DPT 12/12/23 4:18 PM  [facility] at [facility] [address] [place], [address] [place], [place] [address] Phone # [phone] Fax # [phone]

## 2023-12-12 ENCOUNTER — Ambulatory Visit: Admitting: Physical Therapy

## 2023-12-12 ENCOUNTER — Encounter: Payer: Self-pay | Admitting: Cardiology

## 2023-12-12 ENCOUNTER — Ambulatory Visit: Attending: Cardiology | Admitting: Cardiology

## 2023-12-12 ENCOUNTER — Encounter: Payer: Self-pay | Admitting: Physical Therapy

## 2023-12-12 VITALS — BP 120/64 | HR 75 | Ht 63.0 in | Wt 184.0 lb

## 2023-12-12 DIAGNOSIS — M6281 Muscle weakness (generalized): Secondary | ICD-10-CM

## 2023-12-12 DIAGNOSIS — R293 Abnormal posture: Secondary | ICD-10-CM | POA: Diagnosis not present

## 2023-12-12 DIAGNOSIS — R29898 Other symptoms and signs involving the musculoskeletal system: Secondary | ICD-10-CM | POA: Diagnosis not present

## 2023-12-12 DIAGNOSIS — R2689 Other abnormalities of gait and mobility: Secondary | ICD-10-CM

## 2023-12-12 DIAGNOSIS — I5020 Unspecified systolic (congestive) heart failure: Secondary | ICD-10-CM

## 2023-12-12 DIAGNOSIS — I10 Essential (primary) hypertension: Secondary | ICD-10-CM | POA: Diagnosis not present

## 2023-12-12 DIAGNOSIS — R262 Difficulty in walking, not elsewhere classified: Secondary | ICD-10-CM

## 2023-12-12 DIAGNOSIS — I4819 Other persistent atrial fibrillation: Secondary | ICD-10-CM

## 2023-12-12 DIAGNOSIS — E782 Mixed hyperlipidemia: Secondary | ICD-10-CM | POA: Diagnosis not present

## 2023-12-12 DIAGNOSIS — Z79899 Other long term (current) drug therapy: Secondary | ICD-10-CM

## 2023-12-12 LAB — CBC

## 2023-12-12 NOTE — Patient Instructions (Signed)
 Medication Instructions:  No changes *If you need a refill on your cardiac medications before your next appointment, please call your pharmacy*  Lab Work: Today we are going to draw a CBC, and Bmet If you have labs (blood work) drawn today and your tests are completely normal, you will receive your results only by: MyChart Message (if you have MyChart) OR A paper copy in the mail If you have any lab test that is abnormal or we need to change your treatment, we will call you to review the results.  Testing/Procedures:   Dear Michelle Wilcox  You are scheduled for a TEE (Transesophageal Echocardiogram) Guided Cardioversion on Thursday, May 1 with Dr. Paulita Boss.  Please arrive at the [facility] (Main Entrance A) at [facility]: [address], [place] [address] at 10:00 AM (This time is 1 hour(s) before your procedure to ensure your preparation).   Free valet parking service is available. You will check in at ADMITTING.   *Please Note: You will receive a call the day before your procedure to confirm the appointment time. That time may have changed from the original time based on the schedule for that day.*  DIET:  Nothing to eat or drink after midnight except a sip of water with medications (see medication instructions below)  MEDICATION INSTRUCTIONS: Continue taking your anticoagulant (blood thinner): Apixaban  (Eliquis ).  You will need to continue this after your procedure until you are told by your provider that it is safe to stop.    LABS:Will be drawn today  FYI:  For your safety, and to allow us  to monitor your vital signs accurately during the surgery/procedure we request: If you have artificial nails, gel coating, SNS etc, please have those removed prior to your surgery/procedure. Not having the nail coverings /polish removed may result in cancellation or delay of your surgery/procedure.  Your support person will be asked to wait in the waiting room  during your procedure.  It is OK to have someone drop you off and come back when you are ready to be discharged.  You cannot drive after the procedure and will need someone to drive you home.  Bring your insurance cards.  *Special Note: Every effort is made to have your procedure done on time. Occasionally there are emergencies that occur at the hospital that may cause delays. Please be patient if a delay does occur.   Follow-Up: At  [facility], you and your health needs are our priority.  As part of our continuing mission to provide you with exceptional heart care, our providers are all part of one team.  This team includes your primary Cardiologist (physician) and Advanced Practice Providers or APPs (Physician Assistants and Nurse Practitioners) who all work together to provide you with the care you need, when you need it.  Your next appointment:   2-3 week(s)  Provider:   [name], NP  We recommend signing up for the patient portal called "MyChart".  Sign up information is provided on this After Visit Summary.  MyChart is used to connect with patients for Virtual Visits (Telemedicine).  Patients are able to view lab/test results, encounter notes, upcoming appointments, etc.  Non-urgent messages can be sent to your provider as well.   To learn more about what you can do with MyChart, go to [email or web address].   Other Instructions:   1st Floor: - Lobby - Registration  - Pharmacy  - Lab - Cafe  2nd Floor: - PV Lab - Diagnostic  Testing (echo, CT, nuclear med)  3rd Floor: - Vacant  4th Floor: - TCTS (cardiothoracic surgery) - AFib Clinic - Structural Heart Clinic - Vascular Surgery  - Vascular Ultrasound  5th Floor: - [facility] Card[facility] (general and EP) - Clinical Pharmacy for coumadin , hypertension, lipid, weight-loss medications, and med management appointments    Valet parking services will be available as well.

## 2023-12-13 LAB — BASIC METABOLIC PANEL WITH GFR
BUN/Creatinine Ratio: 13 (ref 12–28)
BUN: 9 mg/dL (ref 8–27)
CO2: 24 mmol/L (ref 20–29)
Calcium: 9.1 mg/dL (ref 8.7–10.3)
Chloride: 100 mmol/L (ref 96–106)
Creatinine, Ser: 0.67 mg/dL (ref 0.57–1.00)
Glucose: 93 mg/dL (ref 70–99)
Potassium: 4.8 mmol/L (ref 3.5–5.2)
Sodium: 139 mmol/L (ref 134–144)
eGFR: 89 mL/min/{1.73_m2} (ref >59)

## 2023-12-13 LAB — CBC
Hematocrit: 41.7 % (ref 34.0–46.6)
Hemoglobin: 14 g/dL (ref 11.1–15.9)
MCH: 32.3 pg (ref 26.6–33.0)
MCHC: 33.6 g/dL (ref 31.5–35.7)
MCV: 96 fL (ref 79–97)
Platelets: 291 10*3/uL (ref 150–450)
RBC: 4.34 x10E6/uL (ref 3.77–5.28)
RDW: 13.2 % (ref 11.7–15.4)
WBC: 10.2 10*3/uL (ref 3.4–10.8)

## 2023-12-16 ENCOUNTER — Telehealth: Payer: Self-pay

## 2023-12-16 NOTE — Telephone Encounter (Signed)
-----   Message from Katlyn D West sent at 12/15/2023  7:58 PM EDT ----- Please let [name] know that her CBC shows no evidence of anemia or infection. Her kidney function and electrolytes are normal. Good results! She can proceed with cardioversion as planned.

## 2023-12-16 NOTE — Telephone Encounter (Signed)
 Called patient advised of below they verbalized understanding. Reminded patient of new address and appointment.

## 2023-12-17 ENCOUNTER — Ambulatory Visit: Admitting: Physical Therapy

## 2023-12-17 DIAGNOSIS — R262 Difficulty in walking, not elsewhere classified: Secondary | ICD-10-CM | POA: Diagnosis not present

## 2023-12-17 DIAGNOSIS — R293 Abnormal posture: Secondary | ICD-10-CM | POA: Diagnosis not present

## 2023-12-17 DIAGNOSIS — R29898 Other symptoms and signs involving the musculoskeletal system: Secondary | ICD-10-CM

## 2023-12-17 DIAGNOSIS — M6281 Muscle weakness (generalized): Secondary | ICD-10-CM

## 2023-12-17 DIAGNOSIS — R2689 Other abnormalities of gait and mobility: Secondary | ICD-10-CM

## 2023-12-17 NOTE — Therapy (Signed)
 OUTPATIENT PHYSICAL THERAPY NEURO TREATMENT   Patient Name: Michelle Wilcox MRN: [id number] DOB:10/16/1945, 78 y.o., female Today's Date: 12/18/2023  PCP: [name], MD REFERRING PROVIDER: Lesa Rape, MD  END OF SESSION:  PT End of Session - 12/17/23 1411     Visit Number 8    Number of Visits 13    Date for PT Re-Evaluation 12/26/23    Authorization Type BCBS Medicare    PT Start Time 1410   late PT start from previous patient   PT Stop Time 1448    PT Time Calculation (min) 38 min    Activity Tolerance Patient tolerated treatment well;Patient limited by fatigue    Behavior During Therapy WFL for tasks assessed/performed                Past Medical History:  Diagnosis Date   Anxiety    hx of   Asthma    at times   CHF (congestive heart failure) (HCC)    Depression    hx of   Dyspnea    Giant cell arteritis (HCC)    Gouty arthropathy    Hemolytic anemia (HCC)    Hyperlipidemia    Hypertension    Irregular heart beat    extra beat   Migraines    Polymyalgia (HCC)    Polymyalgia rheumatica (HCC)    Temporal arteritis (HCC)    Past Surgical History:  Procedure Laterality Date   BARTHOLIN GLAND CYST EXCISION     non ca   BREAST BIOPSY Right    CARDIOVERSION N/A 11/05/2023   Procedure: CARDIOVERSION;  Surgeon: Jerryl Morin, DO;  Location: [facility];  Service: Cardiovascular;  Laterality: N/A;   EYE MUSCLE SURGERY     as a child 48 yrs old   KNEE SURGERY  2004   rt knee   LAPAROSCOPIC SPLENECTOMY N/A 06/18/2018   Procedure: LAPAROSCOPIC SPLENECTOMY ERAS PATHWAY;  Surgeon: Ayesha Lente, MD;  Location: [facility];  Service: General;  Laterality: N/A;   toncil     TONSILLECTOMY     as a child   TRANSESOPHAGEAL ECHOCARDIOGRAM (CATH LAB) N/A 11/05/2023   Procedure: TRANSESOPHAGEAL ECHOCARDIOGRAM;  Surgeon: Jerryl Morin, DO;  Location: [facility];  Service: Cardiovascular;  Laterality: N/A;   Patient Active Problem List    Diagnosis Date Noted   Pericardial effusion 11/07/2023   Atrial fibrillation with RVR (HCC) 11/05/2023   Borderline abnormal TFTs 11/05/2023   Macrocytosis 11/05/2023   Atrial fibrillation (HCC) 11/04/2023   PMR (polymyalgia rheumatica) (HCC) 11/04/2023   Primary localized osteoarthrosis of multiple sites 06/15/2022   Diastolic dysfunction without heart failure 03/05/2018   DOE (dyspnea on exertion) 03/05/2018   Preop cardiovascular exam 03/05/2018   Mild reactive airways disease 08/04/2017   Volume overload 08/03/2017   Hypokalemia 08/03/2017   Essential hypertension 08/03/2017   Hyperlipidemia 08/03/2017   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   Hypersensitivity reaction 11/20/2016   Drusen (degenerative) of retina, bilateral 05/22/2016   Nuclear cataract 05/22/2016   Retinal hemorrhage of left eye 05/22/2016   Temporal arteritis (HCC) 05/22/2016   Hemolytic anemia (HCC) 03/19/2016   Breast mass, right 07/01/2012    ONSET DATE: 11/04/23  REFERRING DIAG: I48.91 (ICD-10-CM) - Atrial fibrillation with RVR (HCC)  THERAPY DIAG:  Other abnormalities of gait and mobility  Other symptoms and signs involving the musculoskeletal system  Muscle weakness (generalized)  Rationale for Evaluation and Treatment: Rehabilitation  SUBJECTIVE:  SUBJECTIVE STATEMENT: Still very fatigued from the a-fib.  Will be looking at if the clot is gone and then depending on that, they will try to do the cardioversion to get out of the a-fib. Pt accompanied by: self  PERTINENT HISTORY: Anxiety, asthma, CHF, depression, gouty arthropathy, anemia, HLD, HTN, migraines, polymyalgia rheumatica, temporal arteritis, R knee surgery   PAIN:  Are you having pain? Yes: NPRS scale: "no" Pain location: R knee Pain  description: sore Aggravating factors: previous fall Relieving factors: knee brace  PRECAUTIONS: Fall; per Cardiac NP's recommendation: goal resting HR is <110bpm. goal heart rate is <135 bpm with exertion.  WEIGHT BEARING RESTRICTIONS: No  FALLS: Has patient fallen in last 6 months? Yes. Number of falls 1  LIVING ENVIRONMENT: Lives with: lives with their spouse who has stage 4 stomach CA (pt reports that she is having to physically assist him "some") Lives in: Other townhouse  Stairs: 2 stories, sleeps on 1st floor in recliner  Has following equipment at home: Environmental consultant - 2 wheeled, cane  PLOF: Independent; currently getting assistance with meals   PATIENT GOALS: improve fatigue   OBJECTIVE:    TODAY'S TREATMENT: 12/17/2023 Activity Comments  Vitals at start of session 142/79, HR 75 bpm, O2 93%  6 MWT:  710 ft 145/87 HR 88 bpm (manual), O2 95%     Sit to stand x 5 reps   Minisquat  x 5 Some pain in R knee  Up on toes, standing x 10   Standing balance at counter: Feet apart/together + head turns/nods EO and EC x 30" Hovering hands at counter         HOME EXERCISE PROGRAM Access Code: [facility] URL: [email or web address] Date: 12/05/2023 Prepared by: [facility] - Outpatient  Rehab - [facility]  Program Notes monitor your heart rate during exercise and make sure it does not go above 135 bpm  Exercises - Seated Bilateral Shoulder External Rotation with Resistance  - 1 x daily - 5 x weekly - 1 sets - 10 reps - Seated Flexion Stretch with Swiss Ball  - 1 x daily - 5 x weekly - 2 sets - 5 reps - 3-5 sec hold - Supine Lower Trunk Rotation  - 1 x daily - 5 x weekly - 2 sets - 10 reps - Supine Bridge  - 1 x daily - 5 x weekly - 2 sets - 10 reps - Side Stepping with Counter Support  - 1 x daily - 5 x weekly - 2-3 sets - 1 min hold - Standing March with Counter Support  - 1 x daily - 5 x weekly - 2-3 sets - 20 reps - Sit to Stand with Arms Crossed  - 1 x daily  - 5 x weekly - 2-3 sets - 10 reps - Standing Hip Abduction with Counter Support  - 1 x daily - 5 x weekly - 2-3 sets - 10 reps  PATIENT EDUCATION: Education details: continue current HEP; discussed upcoming procedure, POC and pt wanting to really try to focus on balance when she returns after procedure Person educated: Patient Education method: Explanation Education comprehension: verbalized understanding _________________________________________________________________________________________________ Note: Objective measures were completed at Evaluation unless otherwise noted.  Vitals: 96% spO2, 97 bpm (102 bpm taken manually),  115/85 mmHg  DIAGNOSTIC FINDINGS: 11/05/23 echo TEE: Left Atrium: Left atrial size was severely dilated. A left atrial/left  atrial appendage thrombus was detected.   COGNITION: Overall cognitive status: Within functional limits for tasks assessed   SENSATION:  WFL  POSTURE: rounded shoulders and forward head  LOWER EXTREMITY MMT:   MMT (in sitting) Right Eval Left Eval  Hip flexion 4+ 4+  Hip extension    Hip abduction 4+ 4+  Hip adduction 4+ 4+  Hip internal rotation    Hip external rotation    Knee flexion 4+ 4+  Knee extension 4+ 4  Ankle dorsiflexion 4+ 4+  Ankle plantarflexion 4 4  Ankle inversion    Ankle eversion    (Blank rows = not tested)  GAIT: Gait pattern: slow gait speed, pushing walker too far forward Assistive device utilized: Walker - 2 wheeled Level of assistance: SBA  FUNCTIONAL TESTS:  5xSTS: 26.23 sec pushing off knees and audible R knee crepitus  Pre: 97% spO2, 89bpm  Post: 95% spO2, 102bpm   43M walk test: 17.53 sec with RW (1.87 ft/sec)                                                                                                                 __________________________________________________________________________________ GOALS: Goals reviewed with patient? Yes  SHORT TERM GOALS: Target date:  12/05/2023  Patient to be independent with initial HEP. Baseline: HEP initiated Goal status: MET  LONG TERM GOALS: Target date: 12/26/2023  Patient to be independent with advanced HEP. Baseline: Not yet initiated  Goal status: IN PROGRESS  Patient to demonstrate gait speed of at least 2.0 ft/sec in order to improve access to community.  Baseline: 1.87 ft/sec Goal status: IN PROGRESS  Patient to report tolerance for grocery trip without fatigue limiting.    Baseline: unable Goal status: IN PROGRESS  6 min walk test to improve to >600 ft with device as needed Baseline: 437 ft>710 ft in 6 MWT 12/17/23 Goal status: MET 12/17/2023  Patient to demonstrate 5xSTS test in <15 sec in order to decrease risk of falls.  Baseline: 26.23 sec pushing off knees and audible R knee crepitus  Goal status: IN PROGRESS   ASSESSMENT:  CLINICAL IMPRESSION: Pt presents today with no new complaints, just awaiting testing/cardiac procedure later this week. Skilled PT session focused on functional strength and gait activities.  Assessed 6 MWT and pt has improved from 437 ft to 710 ft, with vitals remaining stable.  Pt does report some knee pain with mini squat activities and is cautious with balance exercises with head motions/EO and EC.  She identifies that she really wants to progress and work on her balance to try to get off of RW.  At this point, endurance for gait does likely still remain a factor in progressing away from RW.   Pt will continue to benefit from skilled PT towards goals for improved functional mobility and decreased fall risk.  OBJECTIVE IMPAIRMENTS: Abnormal gait, cardiopulmonary status limiting activity, decreased activity tolerance, decreased balance, difficulty walking, decreased strength, and pain.   PLAN:  PT FREQUENCY: 1-2x/week  PT DURATION: 6 weeks  PLANNED INTERVENTIONS: 97164- PT Re-evaluation, 97110-Therapeutic exercises, 97530- Therapeutic activity, [id number]- Neuromuscular  re-education, 97535- Self Care, 46962- Manual therapy,  82956- Gait training, 21308- Canalith repositioning, 65784- Aquatic Therapy, Patient/Family education, Balance training, Stair training, Taping, Dry Needling, Vestibular training, DME instructions, Cryotherapy, and Moist heat  PLAN FOR NEXT SESSION: Check LTGs next week and look at additional balance measures (pt likely wants to continue beyond next week).  Progress endurance while monitoring vitals, possibly using interval training , general conditioning and strengthening.   Dessie Flow, PT 12/18/23 7:04 AM Phone: [phone] Fax: [phone]  [facility] at [facility] [address] [place], [address] [place], [place] [address] Phone # [phone] Fax # [phone]

## 2023-12-18 ENCOUNTER — Encounter: Payer: Self-pay | Admitting: Physical Therapy

## 2023-12-18 NOTE — Progress Notes (Signed)
 Pt called for pre procedure instructions. Arrival time 0900 NPO after midnight explained Instructed to take am meds with sip of water and confirmed blood thinner consistency. Instructed pt need for ride home tomorrow and have responsible adult with them for 24 hrs post procedure.

## 2023-12-19 ENCOUNTER — Ambulatory Visit (HOSPITAL_COMMUNITY): Admitting: Certified Registered"

## 2023-12-19 ENCOUNTER — Ambulatory Visit: Admitting: Physical Therapy

## 2023-12-19 ENCOUNTER — Ambulatory Visit (HOSPITAL_COMMUNITY)
Admission: RE | Admit: 2023-12-19 | Discharge: 2023-12-19 | Disposition: A | Discharge status: Home / Self Care | Source: Ambulatory Visit | Attending: Cardiology | Admitting: Cardiology

## 2023-12-19 ENCOUNTER — Encounter (HOSPITAL_COMMUNITY): Payer: Self-pay | Admitting: Internal Medicine

## 2023-12-19 ENCOUNTER — Other Ambulatory Visit: Payer: Self-pay

## 2023-12-19 ENCOUNTER — Encounter (HOSPITAL_COMMUNITY)
Admission: RE | Disposition: A | Discharge status: Home / Self Care | Payer: Self-pay | Source: Home / Self Care | Attending: Internal Medicine

## 2023-12-19 ENCOUNTER — Ambulatory Visit (HOSPITAL_COMMUNITY)
Admission: RE | Admit: 2023-12-19 | Discharge: 2023-12-19 | Disposition: A | Discharge status: Home / Self Care | Source: Home / Self Care | Attending: Internal Medicine | Admitting: Internal Medicine

## 2023-12-19 DIAGNOSIS — M316 Other giant cell arteritis: Secondary | ICD-10-CM | POA: Diagnosis not present

## 2023-12-19 DIAGNOSIS — Z7901 Long term (current) use of anticoagulants: Secondary | ICD-10-CM | POA: Insufficient documentation

## 2023-12-19 DIAGNOSIS — M353 Polymyalgia rheumatica: Secondary | ICD-10-CM | POA: Diagnosis not present

## 2023-12-19 DIAGNOSIS — I083 Combined rheumatic disorders of mitral, aortic and tricuspid valves: Secondary | ICD-10-CM | POA: Insufficient documentation

## 2023-12-19 DIAGNOSIS — Z79899 Other long term (current) drug therapy: Secondary | ICD-10-CM | POA: Insufficient documentation

## 2023-12-19 DIAGNOSIS — I2489 Other forms of acute ischemic heart disease: Secondary | ICD-10-CM | POA: Diagnosis not present

## 2023-12-19 DIAGNOSIS — I4819 Other persistent atrial fibrillation: Secondary | ICD-10-CM | POA: Diagnosis not present

## 2023-12-19 DIAGNOSIS — R918 Other nonspecific abnormal finding of lung field: Secondary | ICD-10-CM | POA: Diagnosis not present

## 2023-12-19 DIAGNOSIS — E785 Hyperlipidemia, unspecified: Secondary | ICD-10-CM | POA: Insufficient documentation

## 2023-12-19 DIAGNOSIS — J45909 Unspecified asthma, uncomplicated: Secondary | ICD-10-CM | POA: Diagnosis not present

## 2023-12-19 DIAGNOSIS — I351 Nonrheumatic aortic (valve) insufficiency: Secondary | ICD-10-CM | POA: Diagnosis not present

## 2023-12-19 DIAGNOSIS — I5033 Acute on chronic diastolic (congestive) heart failure: Secondary | ICD-10-CM | POA: Diagnosis not present

## 2023-12-19 DIAGNOSIS — D591 Autoimmune hemolytic anemia, unspecified: Secondary | ICD-10-CM | POA: Diagnosis not present

## 2023-12-19 DIAGNOSIS — J9601 Acute respiratory failure with hypoxia: Secondary | ICD-10-CM | POA: Diagnosis not present

## 2023-12-19 DIAGNOSIS — I499 Cardiac arrhythmia, unspecified: Secondary | ICD-10-CM | POA: Diagnosis not present

## 2023-12-19 DIAGNOSIS — I4891 Unspecified atrial fibrillation: Secondary | ICD-10-CM

## 2023-12-19 DIAGNOSIS — F419 Anxiety disorder, unspecified: Secondary | ICD-10-CM | POA: Diagnosis not present

## 2023-12-19 DIAGNOSIS — I361 Nonrheumatic tricuspid (valve) insufficiency: Secondary | ICD-10-CM | POA: Diagnosis not present

## 2023-12-19 DIAGNOSIS — I110 Hypertensive heart disease with heart failure: Secondary | ICD-10-CM

## 2023-12-19 DIAGNOSIS — M19042 Primary osteoarthritis, left hand: Secondary | ICD-10-CM | POA: Diagnosis not present

## 2023-12-19 DIAGNOSIS — I5031 Acute diastolic (congestive) heart failure: Secondary | ICD-10-CM | POA: Diagnosis not present

## 2023-12-19 DIAGNOSIS — I340 Nonrheumatic mitral (valve) insufficiency: Secondary | ICD-10-CM | POA: Diagnosis not present

## 2023-12-19 DIAGNOSIS — I10 Essential (primary) hypertension: Secondary | ICD-10-CM | POA: Diagnosis not present

## 2023-12-19 DIAGNOSIS — R011 Cardiac murmur, unspecified: Secondary | ICD-10-CM | POA: Diagnosis not present

## 2023-12-19 DIAGNOSIS — R0902 Hypoxemia: Secondary | ICD-10-CM | POA: Diagnosis not present

## 2023-12-19 DIAGNOSIS — M79642 Pain in left hand: Secondary | ICD-10-CM | POA: Diagnosis not present

## 2023-12-19 DIAGNOSIS — D6869 Other thrombophilia: Secondary | ICD-10-CM | POA: Diagnosis not present

## 2023-12-19 DIAGNOSIS — G43909 Migraine, unspecified, not intractable, without status migrainosus: Secondary | ICD-10-CM | POA: Diagnosis not present

## 2023-12-19 DIAGNOSIS — I5022 Chronic systolic (congestive) heart failure: Secondary | ICD-10-CM | POA: Insufficient documentation

## 2023-12-19 DIAGNOSIS — M109 Gout, unspecified: Secondary | ICD-10-CM | POA: Diagnosis not present

## 2023-12-19 DIAGNOSIS — Z604 Social exclusion and rejection: Secondary | ICD-10-CM | POA: Diagnosis not present

## 2023-12-19 DIAGNOSIS — E739 Lactose intolerance, unspecified: Secondary | ICD-10-CM | POA: Diagnosis not present

## 2023-12-19 DIAGNOSIS — I5023 Acute on chronic systolic (congestive) heart failure: Secondary | ICD-10-CM | POA: Diagnosis not present

## 2023-12-19 DIAGNOSIS — M1990 Unspecified osteoarthritis, unspecified site: Secondary | ICD-10-CM | POA: Diagnosis not present

## 2023-12-19 DIAGNOSIS — I509 Heart failure, unspecified: Secondary | ICD-10-CM | POA: Diagnosis not present

## 2023-12-19 DIAGNOSIS — M19072 Primary osteoarthritis, left ankle and foot: Secondary | ICD-10-CM | POA: Diagnosis not present

## 2023-12-19 DIAGNOSIS — R0989 Other specified symptoms and signs involving the circulatory and respiratory systems: Secondary | ICD-10-CM | POA: Diagnosis not present

## 2023-12-19 DIAGNOSIS — R7989 Other specified abnormal findings of blood chemistry: Secondary | ICD-10-CM | POA: Diagnosis not present

## 2023-12-19 DIAGNOSIS — I517 Cardiomegaly: Secondary | ICD-10-CM | POA: Diagnosis not present

## 2023-12-19 DIAGNOSIS — R0602 Shortness of breath: Secondary | ICD-10-CM | POA: Diagnosis not present

## 2023-12-19 DIAGNOSIS — I959 Hypotension, unspecified: Secondary | ICD-10-CM | POA: Diagnosis not present

## 2023-12-19 DIAGNOSIS — D72829 Elevated white blood cell count, unspecified: Secondary | ICD-10-CM | POA: Diagnosis not present

## 2023-12-19 DIAGNOSIS — M1812 Unilateral primary osteoarthritis of first carpometacarpal joint, left hand: Secondary | ICD-10-CM | POA: Diagnosis not present

## 2023-12-19 DIAGNOSIS — I480 Paroxysmal atrial fibrillation: Secondary | ICD-10-CM | POA: Diagnosis not present

## 2023-12-19 DIAGNOSIS — F32A Depression, unspecified: Secondary | ICD-10-CM | POA: Diagnosis not present

## 2023-12-19 DIAGNOSIS — M2042 Other hammer toe(s) (acquired), left foot: Secondary | ICD-10-CM | POA: Diagnosis not present

## 2023-12-19 DIAGNOSIS — G629 Polyneuropathy, unspecified: Secondary | ICD-10-CM | POA: Diagnosis not present

## 2023-12-19 DIAGNOSIS — M79672 Pain in left foot: Secondary | ICD-10-CM | POA: Diagnosis not present

## 2023-12-19 DIAGNOSIS — M2012 Hallux valgus (acquired), left foot: Secondary | ICD-10-CM | POA: Diagnosis not present

## 2023-12-19 HISTORY — PX: CARDIOVERSION: EP1203

## 2023-12-19 HISTORY — PX: TRANSESOPHAGEAL ECHOCARDIOGRAM (CATH LAB): EP1270

## 2023-12-19 LAB — ECHO TEE

## 2023-12-19 SURGERY — TRANSESOPHAGEAL ECHOCARDIOGRAM (TEE) (CATHLAB)
Anesthesia: Monitor Anesthesia Care

## 2023-12-19 MED ORDER — SODIUM CHLORIDE 0.9 % IV SOLN: INTRAVENOUS | Status: DC | PRN

## 2023-12-19 MED ORDER — PROPOFOL 10 MG/ML IV BOLUS
INTRAVENOUS | Status: DC | PRN
  Administered 2023-12-19: 30 mg via INTRAVENOUS
  Administered 2023-12-19: 20 mg via INTRAVENOUS
  Administered 2023-12-19: 50 mg via INTRAVENOUS
  Administered 2023-12-19 (×2): 30 mg via INTRAVENOUS

## 2023-12-19 MED ORDER — SODIUM CHLORIDE 0.9% FLUSH: 3.0000 mL | INTRAVENOUS | Status: DC | PRN

## 2023-12-19 MED ORDER — SODIUM CHLORIDE 0.9% FLUSH
3.0000 mL | Freq: Two times a day (BID) | INTRAVENOUS | Status: DC
Start: 2023-12-19 — End: 2023-12-19

## 2023-12-19 MED ORDER — PHENYLEPHRINE HCL (PRESSORS) 10 MG/ML IV SOLN
INTRAVENOUS | Status: DC | PRN
  Administered 2023-12-19: 80 ug via INTRAVENOUS

## 2023-12-19 MED ORDER — PERFLUTREN LIPID MICROSPHERE
1.0000 mL | INTRAVENOUS | Status: AC | PRN
  Administered 2023-12-19: 3 mL via INTRAVENOUS

## 2023-12-19 SURGICAL SUPPLY — 1 items: PAD DEFIB RADIO PHYSIO CONN (PAD) ×1 IMPLANT

## 2023-12-19 NOTE — Anesthesia Postprocedure Evaluation (Signed)
 Anesthesia Post Note  Patient: Michelle Wilcox  Procedure(s) Performed: TRANSESOPHAGEAL ECHOCARDIOGRAM CARDIOVERSION     Patient location during evaluation: PACU Anesthesia Type: MAC Level of consciousness: awake and alert Pain management: pain level controlled Vital Signs Assessment: post-procedure vital signs reviewed and stable Respiratory status: spontaneous breathing, nonlabored ventilation, respiratory function stable and patient connected to nasal cannula oxygen Cardiovascular status: stable and blood pressure returned to baseline Postop Assessment: no apparent nausea or vomiting Anesthetic complications: no   No notable events documented.  Last Vitals:  Vitals:   12/19/23 1030 12/19/23 1045  BP: 128/76 131/77  Pulse: 65 61  Resp: (!) 23 (!) 25  Temp:    SpO2: 95% 95%    Last Pain:  Vitals:   12/19/23 1045  TempSrc:   PainSc: 0-No pain                 Doralee Kocak S

## 2023-12-19 NOTE — Interval H&P Note (Signed)
 History and Physical Interval Note:  12/19/2023 9:14 AM  Michelle Wilcox  has presented today for surgery, with the diagnosis of PERSISTENT AFIB.  The various methods of treatment have been discussed with the patient and family. After consideration of risks, benefits and other options for treatment, the patient has consented to  Procedure(s): TRANSESOPHAGEAL ECHOCARDIOGRAM (N/A) CARDIOVERSION (N/A) as a surgical intervention.  The patient's history has been reviewed, patient examined, no change in status, stable for surgery.  I have reviewed the patient's chart and labs.  Questions were answered to the patient's satisfaction.     Ketzaly Cardella A Chanel Mcadams

## 2023-12-19 NOTE — CV Procedure (Signed)
   TRANSESOPHAGEAL ECHOCARDIOGRAM GUIDED DIRECT CURRENT CARDIOVERSION  NAME:  Michelle Wilcox    MRN: [id number] DOB:  May 01, 1946    ADMIT DATE: 12/19/2023  INDICATIONS: Symptomatic atrial fibrillation  PROCEDURE:   Informed consent was obtained prior to the procedure. The risks, benefits and alternatives for the procedure were discussed and the patient comprehended these risks.  Risks include, but are not limited to, cough, sore throat, vomiting, nausea, somnolence, esophageal and stomach trauma or perforation, bleeding, low blood pressure, aspiration, pneumonia, infection, trauma to the teeth and death.    After a procedural time-out, the oropharynx was anesthetized and the patient was sedated by the anesthesia service. The transesophageal probe was inserted in the esophagus and stomach without difficulty and multiple views were obtained. Anesthesia was monitored by Dr. Joyce Nixon.   COMPLICATIONS:    Complications: No complications Patient tolerated procedure well.  KEY FINDINGS:  Patent left atrial appendage- confirmed with contrast and 3D MPR. Moderate to severe atrial functional and central in nature.  At SBP 90 mm HG, moderate.  More significant at SBP 128 mm Hg.  Given contrast, unable to re-image in sinus rhythm Moderate atrial functional tricuspid regurgitation- there are two jets. Moderate, aortic regurgitation- there are two jets. Full Report to follow.   CARDIOVERSION:     Indications:  Symptomatic Atrial Fibrillation  Procedure Details:  Once the TEE was complete, the patient had the defibrillator pads placed in the anterior and posterior position. Once an appropriate level of sedation was confirmed, the patient was cardioverted x 1 with 200J of biphasic synchronized energy.  The patient converted to NSR.  There were no apparent complications.  The patient had normal neuro status and respiratory status post procedure with vitals stable as recorded elsewhere.   Adequate airway was maintained throughout and vital signs monitored per protocol.  Gloriann Larger, MD [place]  [facility]  10:14 AM

## 2023-12-19 NOTE — Transfer of Care (Signed)
 Immediate Anesthesia Transfer of Care Note  Patient: Michelle Wilcox  Procedure(s) Performed: TRANSESOPHAGEAL ECHOCARDIOGRAM CARDIOVERSION  Patient Location: PACU  Anesthesia Type:MAC  Level of Consciousness: drowsy  Airway & Oxygen Therapy: Patient Spontanous Breathing and Patient connected to nasal cannula oxygen  Post-op Assessment: Report given to RN and Post -op Vital signs reviewed and stable  Post vital signs: Reviewed and stable  Last Vitals:  Vitals Value Taken Time  BP    Temp    Pulse    Resp    SpO2      Last Pain:  Vitals:   12/19/23 0901  TempSrc: Temporal         Complications: No notable events documented.

## 2023-12-19 NOTE — Anesthesia Preprocedure Evaluation (Signed)
 Anesthesia Evaluation  Patient identified by MRN, date of birth, ID band Patient awake    Reviewed: Allergy & Precautions, H&P , NPO status , Patient's Chart, lab work & pertinent test results  Airway Mallampati: II   Neck ROM: full    Dental   Pulmonary shortness of breath, asthma    breath sounds clear to auscultation       Cardiovascular hypertension, +CHF and + DOE  + dysrhythmias Atrial Fibrillation  Rhythm:irregular Rate:Normal     Neuro/Psych  Headaches PSYCHIATRIC DISORDERS Anxiety Depression     Neuromuscular disease    GI/Hepatic   Endo/Other    Renal/GU      Musculoskeletal  (+) Arthritis ,    Abdominal   Peds  Hematology   Anesthesia Other Findings   Reproductive/Obstetrics                             Anesthesia Physical Anesthesia Plan  ASA: 3  Anesthesia Plan: MAC   Post-op Pain Management:    Induction: Intravenous  PONV Risk Score and Plan: 2 and Propofol  infusion and Treatment may vary due to age or medical condition  Airway Management Planned: Nasal Cannula  Additional Equipment:   Intra-op Plan:   Post-operative Plan:   Informed Consent: I have reviewed the patients History and Physical, chart, labs and discussed the procedure including the risks, benefits and alternatives for the proposed anesthesia with the patient or authorized representative who has indicated his/her understanding and acceptance.     Dental advisory given  Plan Discussed with: CRNA, Anesthesiologist and Surgeon  Anesthesia Plan Comments:        Anesthesia Quick Evaluation

## 2023-12-20 ENCOUNTER — Inpatient Hospital Stay (HOSPITAL_COMMUNITY)
Admission: EM | Admit: 2023-12-20 | Discharge: 2023-12-26 | DRG: 273 | Disposition: A | Discharge status: Home / Self Care | Attending: Internal Medicine | Admitting: Internal Medicine

## 2023-12-20 ENCOUNTER — Encounter (HOSPITAL_COMMUNITY): Payer: Self-pay | Admitting: Emergency Medicine

## 2023-12-20 ENCOUNTER — Other Ambulatory Visit: Payer: Self-pay

## 2023-12-20 ENCOUNTER — Emergency Department (HOSPITAL_COMMUNITY)

## 2023-12-20 DIAGNOSIS — I110 Hypertensive heart disease with heart failure: Secondary | ICD-10-CM | POA: Diagnosis present

## 2023-12-20 DIAGNOSIS — I4819 Other persistent atrial fibrillation: Principal | ICD-10-CM | POA: Diagnosis present

## 2023-12-20 DIAGNOSIS — G43909 Migraine, unspecified, not intractable, without status migrainosus: Secondary | ICD-10-CM | POA: Diagnosis present

## 2023-12-20 DIAGNOSIS — Z9089 Acquired absence of other organs: Secondary | ICD-10-CM

## 2023-12-20 DIAGNOSIS — Z823 Family history of stroke: Secondary | ICD-10-CM

## 2023-12-20 DIAGNOSIS — D72829 Elevated white blood cell count, unspecified: Secondary | ICD-10-CM | POA: Diagnosis present

## 2023-12-20 DIAGNOSIS — Z9181 History of falling: Secondary | ICD-10-CM

## 2023-12-20 DIAGNOSIS — F419 Anxiety disorder, unspecified: Secondary | ICD-10-CM | POA: Diagnosis present

## 2023-12-20 DIAGNOSIS — M353 Polymyalgia rheumatica: Secondary | ICD-10-CM | POA: Diagnosis present

## 2023-12-20 DIAGNOSIS — Z91018 Allergy to other foods: Secondary | ICD-10-CM

## 2023-12-20 DIAGNOSIS — I509 Heart failure, unspecified: Principal | ICD-10-CM

## 2023-12-20 DIAGNOSIS — J9601 Acute respiratory failure with hypoxia: Secondary | ICD-10-CM | POA: Diagnosis present

## 2023-12-20 DIAGNOSIS — Z888 Allergy status to other drugs, medicaments and biological substances status: Secondary | ICD-10-CM

## 2023-12-20 DIAGNOSIS — I959 Hypotension, unspecified: Secondary | ICD-10-CM | POA: Diagnosis present

## 2023-12-20 DIAGNOSIS — Z7901 Long term (current) use of anticoagulants: Secondary | ICD-10-CM

## 2023-12-20 DIAGNOSIS — I5033 Acute on chronic diastolic (congestive) heart failure: Secondary | ICD-10-CM | POA: Diagnosis present

## 2023-12-20 DIAGNOSIS — I5023 Acute on chronic systolic (congestive) heart failure: Secondary | ICD-10-CM | POA: Insufficient documentation

## 2023-12-20 DIAGNOSIS — E785 Hyperlipidemia, unspecified: Secondary | ICD-10-CM | POA: Diagnosis present

## 2023-12-20 DIAGNOSIS — E739 Lactose intolerance, unspecified: Secondary | ICD-10-CM | POA: Diagnosis present

## 2023-12-20 DIAGNOSIS — Z7952 Long term (current) use of systemic steroids: Secondary | ICD-10-CM

## 2023-12-20 DIAGNOSIS — Z882 Allergy status to sulfonamides status: Secondary | ICD-10-CM

## 2023-12-20 DIAGNOSIS — I10 Essential (primary) hypertension: Secondary | ICD-10-CM | POA: Diagnosis present

## 2023-12-20 DIAGNOSIS — I2489 Other forms of acute ischemic heart disease: Secondary | ICD-10-CM | POA: Diagnosis present

## 2023-12-20 DIAGNOSIS — I428 Other cardiomyopathies: Secondary | ICD-10-CM

## 2023-12-20 DIAGNOSIS — I499 Cardiac arrhythmia, unspecified: Secondary | ICD-10-CM | POA: Diagnosis present

## 2023-12-20 DIAGNOSIS — M1990 Unspecified osteoarthritis, unspecified site: Secondary | ICD-10-CM | POA: Diagnosis present

## 2023-12-20 DIAGNOSIS — M316 Other giant cell arteritis: Secondary | ICD-10-CM | POA: Diagnosis present

## 2023-12-20 DIAGNOSIS — J45909 Unspecified asthma, uncomplicated: Secondary | ICD-10-CM | POA: Diagnosis present

## 2023-12-20 DIAGNOSIS — Z9889 Other specified postprocedural states: Secondary | ICD-10-CM

## 2023-12-20 DIAGNOSIS — I083 Combined rheumatic disorders of mitral, aortic and tricuspid valves: Secondary | ICD-10-CM | POA: Diagnosis present

## 2023-12-20 DIAGNOSIS — Z9081 Acquired absence of spleen: Secondary | ICD-10-CM

## 2023-12-20 DIAGNOSIS — I480 Paroxysmal atrial fibrillation: Secondary | ICD-10-CM | POA: Diagnosis present

## 2023-12-20 DIAGNOSIS — Z880 Allergy status to penicillin: Secondary | ICD-10-CM

## 2023-12-20 DIAGNOSIS — Z79899 Other long term (current) drug therapy: Secondary | ICD-10-CM

## 2023-12-20 DIAGNOSIS — D6869 Other thrombophilia: Secondary | ICD-10-CM | POA: Diagnosis present

## 2023-12-20 DIAGNOSIS — F32A Depression, unspecified: Secondary | ICD-10-CM | POA: Diagnosis present

## 2023-12-20 DIAGNOSIS — D591 Autoimmune hemolytic anemia, unspecified: Secondary | ICD-10-CM | POA: Diagnosis present

## 2023-12-20 DIAGNOSIS — G629 Polyneuropathy, unspecified: Secondary | ICD-10-CM | POA: Diagnosis present

## 2023-12-20 DIAGNOSIS — Z881 Allergy status to other antibiotic agents status: Secondary | ICD-10-CM

## 2023-12-20 DIAGNOSIS — Z604 Social exclusion and rejection: Secondary | ICD-10-CM | POA: Diagnosis present

## 2023-12-20 DIAGNOSIS — R011 Cardiac murmur, unspecified: Secondary | ICD-10-CM | POA: Diagnosis present

## 2023-12-20 DIAGNOSIS — I4891 Unspecified atrial fibrillation: Secondary | ICD-10-CM | POA: Diagnosis present

## 2023-12-20 DIAGNOSIS — Z8261 Family history of arthritis: Secondary | ICD-10-CM

## 2023-12-20 DIAGNOSIS — M109 Gout, unspecified: Secondary | ICD-10-CM | POA: Diagnosis present

## 2023-12-20 LAB — CBC WITH DIFFERENTIAL/PLATELET
Abs Immature Granulocytes: 0.09 10*3/uL — ABNORMAL HIGH (ref 0.00–0.07)
Basophils Absolute: 0.2 10*3/uL — ABNORMAL HIGH (ref 0.0–0.1)
Basophils Relative: 1 %
Eosinophils Absolute: 0.5 10*3/uL (ref 0.0–0.5)
Eosinophils Relative: 2 %
HCT: 44.8 % (ref 36.0–46.0)
Hemoglobin: 14.3 g/dL (ref 12.0–15.0)
Immature Granulocytes: 1 %
Lymphocytes Relative: 17 %
Lymphs Abs: 3.2 10*3/uL (ref 0.7–4.0)
MCH: 32 pg (ref 26.0–34.0)
MCHC: 31.9 g/dL (ref 30.0–36.0)
MCV: 100.2 fL — ABNORMAL HIGH (ref 80.0–100.0)
Monocytes Absolute: 1.2 10*3/uL — ABNORMAL HIGH (ref 0.1–1.0)
Monocytes Relative: 6 %
Neutro Abs: 13.7 10*3/uL — ABNORMAL HIGH (ref 1.7–7.7)
Neutrophils Relative %: 73 %
Platelets: 354 10*3/uL (ref 150–400)
RBC: 4.47 MIL/uL (ref 3.87–5.11)
RDW: 16.1 % — ABNORMAL HIGH (ref 11.5–15.5)
WBC: 18.8 10*3/uL — ABNORMAL HIGH (ref 4.0–10.5)
nRBC: 0 % (ref 0.0–0.2)

## 2023-12-20 LAB — I-STAT VENOUS BLOOD GAS, ED
Acid-base deficit: 1 mmol/L (ref 0.0–2.0)
Bicarbonate: 26 mmol/L (ref 20.0–28.0)
Calcium, Ion: 1.08 mmol/L — ABNORMAL LOW (ref 1.15–1.40)
HCT: 45 % (ref 36.0–46.0)
Hemoglobin: 15.3 g/dL — ABNORMAL HIGH (ref 12.0–15.0)
O2 Saturation: 92 %
Potassium: 3.7 mmol/L (ref 3.5–5.1)
Sodium: 136 mmol/L (ref 135–145)
TCO2: 28 mmol/L (ref 22–32)
pCO2, Ven: 50.3 mmHg (ref 44–60)
pH, Ven: 7.322 (ref 7.25–7.43)
pO2, Ven: 69 mmHg — ABNORMAL HIGH (ref 32–45)

## 2023-12-20 LAB — COMPREHENSIVE METABOLIC PANEL WITH GFR
ALT: 18 U/L (ref 0–44)
AST: 46 U/L — ABNORMAL HIGH (ref 15–41)
Albumin: 4.1 g/dL (ref 3.5–5.0)
Alkaline Phosphatase: 63 U/L (ref 38–126)
Anion gap: 12 (ref 5–15)
BUN: 8 mg/dL (ref 8–23)
CO2: 22 mmol/L (ref 22–32)
Calcium: 8.4 mg/dL — ABNORMAL LOW (ref 8.9–10.3)
Chloride: 101 mmol/L (ref 98–111)
Creatinine, Ser: 0.73 mg/dL (ref 0.44–1.00)
GFR, Estimated: >60 mL/min (ref >60)
Glucose, Bld: 117 mg/dL — ABNORMAL HIGH (ref 70–99)
Potassium: 3.9 mmol/L (ref 3.5–5.1)
Sodium: 135 mmol/L (ref 135–145)
Total Bilirubin: 0.9 mg/dL (ref 0.0–1.2)
Total Protein: 6.5 g/dL (ref 6.5–8.1)

## 2023-12-20 LAB — I-STAT CHEM 8, ED
BUN: 9 mg/dL (ref 8–23)
Calcium, Ion: 1.05 mmol/L — ABNORMAL LOW (ref 1.15–1.40)
Chloride: 100 mmol/L (ref 98–111)
Creatinine, Ser: 0.6 mg/dL (ref 0.44–1.00)
Glucose, Bld: 114 mg/dL — ABNORMAL HIGH (ref 70–99)
HCT: 47 % — ABNORMAL HIGH (ref 36.0–46.0)
Hemoglobin: 16 g/dL — ABNORMAL HIGH (ref 12.0–15.0)
Potassium: 3.6 mmol/L (ref 3.5–5.1)
Sodium: 137 mmol/L (ref 135–145)
TCO2: 27 mmol/L (ref 22–32)

## 2023-12-20 LAB — BRAIN NATRIURETIC PEPTIDE: B Natriuretic Peptide: 193.9 pg/mL — ABNORMAL HIGH (ref 0.0–100.0)

## 2023-12-20 LAB — TROPONIN I (HIGH SENSITIVITY): Troponin I (High Sensitivity): 28 ng/L — ABNORMAL HIGH (ref <18)

## 2023-12-20 MED ORDER — DILTIAZEM HCL-DEXTROSE 125-5 MG/125ML-% IV SOLN (PREMIX)
5.0000 mg/h | INTRAVENOUS | Status: DC
  Administered 2023-12-20: 5 mg/h via INTRAVENOUS
  Filled 2023-12-20: qty 125

## 2023-12-20 MED ORDER — AMIODARONE HCL IN DEXTROSE 360-4.14 MG/200ML-% IV SOLN
60.0000 mg/h | INTRAVENOUS | Status: AC
  Administered 2023-12-20: 60 mg/h via INTRAVENOUS
  Filled 2023-12-20: qty 200

## 2023-12-20 MED ORDER — AMIODARONE HCL IN DEXTROSE 360-4.14 MG/200ML-% IV SOLN
30.0000 mg/h | INTRAVENOUS | Status: DC
  Administered 2023-12-21 – 2023-12-23 (×5): 30 mg/h via INTRAVENOUS
  Filled 2023-12-20 (×5): qty 200

## 2023-12-20 MED ORDER — ALBUTEROL SULFATE (2.5 MG/3ML) 0.083% IN NEBU
15.0000 mg | INHALATION_SOLUTION | Freq: Once | RESPIRATORY_TRACT | Status: AC
  Administered 2023-12-20: 15 mg via RESPIRATORY_TRACT
  Filled 2023-12-20: qty 18

## 2023-12-20 MED ORDER — LORAZEPAM 2 MG/ML IJ SOLN
1.0000 mg | Freq: Once | INTRAMUSCULAR | Status: AC
  Administered 2023-12-20: 1 mg via INTRAVENOUS
  Filled 2023-12-20: qty 1

## 2023-12-20 MED ORDER — DILTIAZEM LOAD VIA INFUSION
10.0000 mg | Freq: Once | INTRAVENOUS | Status: AC
Start: 2023-12-20 — End: 2023-12-20
  Administered 2023-12-20: 10 mg via INTRAVENOUS
  Filled 2023-12-20: qty 10

## 2023-12-20 MED ORDER — DILTIAZEM HCL 25 MG/5ML IV SOLN: 10.0000 mg | Freq: Once | INTRAVENOUS | Status: DC

## 2023-12-20 MED ORDER — FUROSEMIDE 10 MG/ML IJ SOLN
40.0000 mg | Freq: Once | INTRAMUSCULAR | Status: AC
  Administered 2023-12-20: 40 mg via INTRAVENOUS
  Filled 2023-12-20: qty 4

## 2023-12-20 MED ORDER — DIGOXIN 125 MCG PO TABS
0.1250 mg | ORAL_TABLET | Freq: Once | ORAL | Status: AC
  Administered 2023-12-20: 0.125 mg via ORAL
  Filled 2023-12-20: qty 1

## 2023-12-20 MED ORDER — MAGNESIUM SULFATE 2 GM/50ML IV SOLN
2.0000 g | Freq: Once | INTRAVENOUS | Status: AC
  Administered 2023-12-20: 2 g via INTRAVENOUS
  Filled 2023-12-20: qty 50

## 2023-12-20 MED ORDER — IPRATROPIUM BROMIDE 0.02 % IN SOLN
1.5000 mg | Freq: Once | RESPIRATORY_TRACT | Status: AC
  Administered 2023-12-20: 1.5 mg via RESPIRATORY_TRACT
  Filled 2023-12-20: qty 7.5

## 2023-12-20 MED ORDER — IPRATROPIUM-ALBUTEROL 0.5-2.5 (3) MG/3ML IN SOLN
3.0000 mL | Freq: Once | RESPIRATORY_TRACT | Status: DC
  Filled 2023-12-20: qty 3

## 2023-12-20 NOTE — ED Triage Notes (Signed)
 Pt BIB EMS from home with c/o [facility] all day, worsening around 1800 after moving a walker into her car. Pt was 60% on RA upon arrival, placed on 15lpm NRB O2 increased 100%.   210/126 24RR 100-140HR  22g LAC

## 2023-12-20 NOTE — ED Provider Notes (Addendum)
 [place] EMERGENCY DEPARTMENT AT [facility] Provider Note   CSN: [id number] Arrival date & time: 12/20/23  2120     History  Chief Complaint  Patient presents with   Shortness of Breath    Michelle Wilcox is a 78 y.o. female hx of afib on eliquis , here presenting with shortness of breath.  Patient had cardioversion done yesterday by cardiology.  Patient states that she has been short of breath all day and got worse this evening around 6 PM.  Patient was noted to have oxygen saturation of 60%.  Patient does have a history of diastolic heart failure.  Patient states that since the procedure she has been taking her Eliquis .  The history is provided by the patient.       Home Medications Prior to Admission medications   Medication Sig Start Date End Date Taking? Authorizing Provider  acetaminophen  (TYLENOL ) 650 MG CR tablet Take 1,300 mg by mouth 2 (two) times daily.    [provider]  ALPRAZolam  (XANAX ) 0.25 MG tablet Take 0.25 mg by mouth 2 (two) times daily as needed for anxiety. 11/18/23   [provider]  amiodarone  (PACERONE ) 200 MG tablet Take 2 tablets (400 mg total) by mouth 2 (two) times daily for 3 days, THEN 1 tablet (200 mg total) 2 (two) times daily for 14 days, THEN 1 tablet (200 mg total) daily. Patient taking differently:  1 tablet (200 mg total) daily. 11/11/23 01/17/24  Lesa Rape, MD  apixaban  (ELIQUIS ) 5 MG TABS tablet Take 1 tablet (5 mg total) by mouth 2 (two) times daily. 11/19/23 05/17/24  West, Katlyn D, NP  Cholecalciferol  (VITAMIN D3) 25 MCG (1000 UT) CAPS Take 1,000 Units by mouth every other day.    [provider]  clindamycin (CLEOCIN) 150 MG capsule Take 600 mg by mouth See admin instructions. Take prior to Dental Procedures 11/25/23   [provider]  digoxin  (LANOXIN ) 0.125 MG tablet Take 1 tablet (0.125 mg total) by mouth daily. 12/05/23   West, Katlyn D, NP  diphenhydrAMINE  (BENADRYL ) 25 MG tablet Take  25-50 mg by mouth daily as needed for allergies. 01/06/22   [provider]  folic acid  (FOLVITE ) 1 MG tablet TAKE 1 TABLET(1 MG) BY MOUTH DAILY 06/12/23   Sumner Ends, MD  gabapentin  (NEURONTIN ) 100 MG capsule Take 200 mg by mouth 2 (two) times daily.    [provider]  lactase (LACTAID) 3000 units tablet Take 9,000-15,000 Units by mouth as needed (consuming dairy products).    [provider]  loperamide  (IMODIUM  A-D) 2 MG tablet Take 2-4 mg by mouth as needed for diarrhea or loose stools (takes 2 if needed).    [provider]  loratadine  (CLARITIN ) 10 MG tablet Take 10 mg by mouth daily.    [provider]  metoprolol  succinate (TOPROL -XL) 50 MG 24 hr tablet Take 1 tablet (50 mg total) by mouth 2 (two) times daily. Take with or immediately following a meal. 11/19/23 05/17/24  West, Katlyn D, NP  Multiple Vitamin (MULTIVITAMIN) tablet Take 1 tablet by mouth daily.    [provider]  predniSONE  (DELTASONE ) 5 MG tablet Take 1 tablet (5 mg total) by mouth daily with breakfast. 10/02/23   Sumner Ends, MD  Probiotic Product (ALIGN) 4 MG CAPS Take 4 mg by mouth every morning.    [provider]  rosuvastatin  (CRESTOR ) 5 MG tablet Take 5 mg by mouth every other day.    [provider]  simethicone  (MYLICON) 80 MG chewable tablet Chew 80-160 mg by mouth as needed for flatulence.    [provider]  VENTOLIN  HFA 108 (90 Base) MCG/ACT inhaler Take 1-2 puffs by mouth every 4 (four) hours as needed for shortness of breath. 06/24/17   [provider]      Allergies    Penicillins, Almond (diagnostic), Ceftin [cefuroxime axetil], Ciprofloxacin, Darvocet [propoxyphene n-acetaminophen ], Lactose intolerance (gi), Levaquin [levofloxacin in d5w], Sulfa antibiotics, Sulfamethoxazole-trimethoprim, Zyrtec [cetirizine hcl], and Other    Review of Systems   Review of Systems  Respiratory:  Positive for shortness of breath.    All other systems reviewed and are negative.   Physical Exam Updated Vital Signs BP (!) 143/78   Pulse (!) 107   Temp 98 F (36.7 C)   Resp (!) 22   Ht 5\' 3"  (1.6 m)   Wt 83 kg   SpO2 100%   BMI 32.41 kg/m  Physical Exam Vitals and nursing note reviewed.  Constitutional:      Comments: Tachypneic in moderate distress  HENT:     Head: Normocephalic.     Mouth/Throat:     Mouth: Mucous membranes are moist.  Eyes:     Extraocular Movements: Extraocular movements intact.     Pupils: Pupils are equal, round, and reactive to light.  Cardiovascular:     Rate and Rhythm: Normal rate and regular rhythm.  Pulmonary:     Comments: Crackles bilateral bases and diminished in upper lobes with some wheezing. Abdominal:     Palpations: Abdomen is soft.  Musculoskeletal:        General: Normal range of motion.     Cervical back: Normal range of motion and neck supple.  Skin:    General: Skin is warm.     Capillary Refill: Capillary refill takes less than 2 seconds.  Neurological:     General: No focal deficit present.  Psychiatric:        Mood and Affect: Mood normal.        Behavior: Behavior normal.     ED Results / Procedures / Treatments   Labs (all labs ordered are listed, but only abnormal results are displayed) Labs Reviewed  CBC WITH DIFFERENTIAL/PLATELET - Abnormal; Notable for the following components:      Result Value   WBC 18.8 (*)    MCV 100.2 (*)    RDW 16.1 (*)    Neutro Abs 13.7 (*)    Monocytes Absolute 1.2 (*)    Basophils Absolute 0.2 (*)    Abs Immature Granulocytes 0.09 (*)    All other components within normal limits  COMPREHENSIVE METABOLIC PANEL WITH GFR - Abnormal; Notable for the following components:   Glucose, Bld 117 (*)    Calcium  8.4 (*)    AST 46 (*)    All other components within normal limits  I-STAT CHEM 8, ED - Abnormal; Notable for the following components:   Glucose, Bld 114 (*)    Calcium , Ion 1.05 (*)    Hemoglobin 16.0  (*)    HCT 47.0 (*)    All other components within normal limits  I-STAT VENOUS BLOOD GAS, ED - Abnormal; Notable for the following components:   pO2, Ven 69 (*)    Calcium , Ion 1.08 (*)    Hemoglobin 15.3 (*)    All other components within normal limits  TROPONIN I (HIGH SENSITIVITY) - Abnormal; Notable for the following components:   Troponin I (High Sensitivity)  28 (*)    All other components within normal limits  BRAIN NATRIURETIC PEPTIDE    EKG None  Radiology DG Chest Port 1 View Result Date: 12/20/2023 CLINICAL DATA:  Shortness of breath EXAM: PORTABLE CHEST 1 VIEW COMPARISON:  11/04/2023 FINDINGS: Cardiac enlargement with pulmonary vascular congestion and perihilar/basilar infiltrates likely edema. This is progressing since prior study. Possible small pleural effusions. No pneumothorax. Mediastinal contours appear intact. Degenerative changes in the spine and shoulders. IMPRESSION: Cardiac enlargement with pulmonary vascular congestion and progressing edema since prior study. Probable small pleural effusions. Electronically Signed   By: Boyce Byes M.D.   On: 12/20/2023 21:45   ECHO TEE Result Date: 12/19/2023    TRANSESOPHOGEAL ECHO REPORT   Patient Name:   Michelle Wilcox Date of Exam: 12/19/2023 Medical Rec #:  [id number]               Height:       63.0 in Accession #:    [id number]              Weight:       184.0 lb Date of Birth:  1946/03/09               BSA:          1.866 m Patient Age:    77 years                BP:           156/89 mmHg Patient Gender: F                       HR:           102 bpm. Exam Location:  Inpatient Procedure: Transesophageal Echo, 3D Echo, Color Doppler, Cardiac Doppler and            Intracardiac Opacification Agent (Both Spectral and Color Flow            Doppler were utilized during procedure). Indications:     I48.91* Unspecified atrial fibrillation  History:         Patient has prior history of Echocardiogram examinations, most                   recent 11/05/2023. Arrythmias:Atrial Fibrillation; Risk                  Factors:Hypertension and Dyslipidemia.  Sonographer:     Sherline Distel Senior RDCS Referring Phys:  KATLYN D WEST Diagnosing Phys: Gloriann Larger MD PROCEDURE: After discussion of the risks and benefits of a TEE, an informed consent was obtained from the patient. The transesophogeal probe was passed without difficulty through the esophogus of the patient. Sedation performed by different physician. The patient was monitored while under deep sedation. Anesthestetic sedation was provided intravenously by Anesthesiology: 160mg  of Propofol . The patient developed no complications during the procedure. A successful direct current cardioversion was performed at 200 joules with 1 attempt.  IMPRESSIONS  1. Left ventricular ejection fraction, by estimation, is 60 to 65%. Left ventricular ejection fraction by 3D volume is 65 %. The left ventricle has normal function.  2. Right ventricular systolic function is normal. The right ventricular size is mildly enlarged.  3. Left atrial size was severely dilated. No left atrial/left atrial appendage thrombus was detected.  4. Right atrial size was severely dilated.  5. Central jet of atrial, functional MR with blunted pulmonary vein signal. The mitral valve is normal in structure.  Moderate to severe mitral valve regurgitation. No evidence of mitral stenosis.  6. Atrial functional tricuspid regurgitation- two jets. Tricuspid valve regurgitation is moderate.  7. There is a larger central jet and a smaller, commissural jet between the non and left cusp; central jet 2DVC: 0.3 cm. The aortic valve is tricuspid. Aortic valve regurgitation is moderate. No aortic stenosis is present.  8. 3D performed of the LAA and demonstrates Patent LAA- Chicken wing morphology. 23 mm maximal diameter.  9. Cannot exclude a small PFO. FINDINGS  Left Ventricle: Left ventricular ejection fraction, by estimation, is 60 to 65%. Left  ventricular ejection fraction by 3D volume is 65 %. The left ventricle has normal function. Definity  contrast agent was given IV to delineate the left ventricular endocardial borders. The left ventricular internal cavity size was normal in size. Right Ventricle: The right ventricular size is mildly enlarged. No increase in right ventricular wall thickness. Right ventricular systolic function is normal. Left Atrium: Left atrial size was severely dilated. No left atrial/left atrial appendage thrombus was detected. Right Atrium: Right atrial size was severely dilated. Pericardium: There is no evidence of pericardial effusion. Mitral Valve: Central jet of atrial, functional MR with blunted pulmonary vein signal. The mitral valve is normal in structure. Moderate to severe mitral valve regurgitation. No evidence of mitral valve stenosis. Tricuspid Valve: Atrial functional tricuspid regurgitation- two jets. The tricuspid valve is normal in structure. Tricuspid valve regurgitation is moderate . No evidence of tricuspid stenosis. Aortic Valve: There is a larger central jet and a smaller, commissural jet between the non and left cusp; central jet 2DVC: 0.3 cm. The aortic valve is tricuspid. Aortic valve regurgitation is moderate. No aortic stenosis is present. Pulmonic Valve: The pulmonic valve was normal in structure. Pulmonic valve regurgitation is trivial. No evidence of pulmonic stenosis. Aorta: The aortic root and ascending aorta are structurally normal, with no evidence of dilitation. IAS/Shunts: There is right bowing of the interatrial septum, suggestive of elevated left atrial pressure. Cannot exclude a small PFO. Additional Comments: 3D was performed not requiring image post processing on an independent workstation and was normal. Spectral Doppler performed.  3D Volume EF LV 3D EF:    Left ventricular ejection fraction by 3D volume is 65              %. LV 3D EDV:   47.36 ml LV 3D ESV:   16.50 ml  3D Volume EF: 3D  EF:        65 % Gloriann Larger MD Electronically signed by Gloriann Larger MD Signature Date/Time: 12/19/2023/12:39:33 PM    Final    EP STUDY Result Date: 12/19/2023 See surgical note for result.   Procedures Procedures   CRITICAL CARE Performed by: Florette Hurry   Total critical care time: 39 minutes  Critical care time was exclusive of separately billable procedures and treating other patients.  Critical care was necessary to treat or prevent imminent or life-threatening deterioration.  Critical care was time spent personally by me on the following activities: development of treatment plan with patient and/or surrogate as well as nursing, discussions with consultants, evaluation of patient's response to treatment, examination of patient, obtaining history from patient or surrogate, ordering and performing treatments and interventions, ordering and review of laboratory studies, ordering and review of radiographic studies, pulse oximetry and re-evaluation of patient's condition.  Medications Ordered in ED Medications  albuterol  (PROVENTIL ) (2.5 MG/3ML) 0.083% nebulizer solution 15 mg (15 mg Nebulization Given 12/20/23 2145)  LORazepam  (  ATIVAN ) injection 1 mg (1 mg Intravenous Given 12/20/23 2135)  furosemide  (LASIX ) injection 40 mg (40 mg Intravenous Given 12/20/23 2136)  ipratropium (ATROVENT ) nebulizer solution 1.5 mg (1.5 mg Nebulization Given 12/20/23 2149)  magnesium  sulfate IVPB 2 g 50 mL (2 g Intravenous New Bag/Given 12/20/23 2136)    ED Course/ Medical Decision Making/ A&P                                 Medical Decision Making Abeera Flannery is a 77 y.o. female here with shortness of breath.  Patient just had cardioversion done yesterday.  Patient had acute onset of shortness of breath today that is getting worse.  Patient does have crackles bilateral bases and appears volume overloaded.  Concern for possible decompensated heart failure versus COPD.  Plan to get CBC  CMP and BNP troponin.  Will put patient on BiPAP.  11:18 PM Patient is doing better on BiPAP.  Patient received continuous nebs and Solu-Medrol .  White blood cell count is up to is 18.  Troponin is up to 28 and BNP is increased to 190.  Patient also received diuresis.  Patient is back in rapid A-fib and I started Cardizem  bolus and drip.  Consulted cardiology, Dr. Gerre Kraft.  He recommend changing to amiodarone  and give digoxin .  Patient will be admitted by hospital service.  Cardiology to see in the morning.  Problems Addressed: Acute on chronic congestive heart failure, unspecified heart failure type [facility]): acute illness or injury Atrial fibrillation with rapid ventricular response (HCC): acute illness or injury  Amount and/or Complexity of Data Reviewed Labs: ordered. Decision-making details documented in ED Course. Radiology: ordered and independent interpretation performed. Decision-making details documented in ED Course. ECG/medicine tests: ordered and independent interpretation performed. Decision-making details documented in ED Course.  Risk Prescription drug management. Decision regarding hospitalization.    Final Clinical Impression(s) / ED Diagnoses Final diagnoses:  None    Rx / DC Orders ED Discharge Orders     None         Dalene Duck, MD 12/20/23 2321    Dalene Duck, MD 12/20/23 2332    Dalene Duck, MD 01/10/24 [phone]

## 2023-12-20 NOTE — Progress Notes (Signed)
   12/20/23 2145  BiPAP/CPAP/SIPAP  $ Non-Invasive Ventilator  Non-Invasive Vent Initial  $ Face Mask Small Yes  BiPAP/CPAP/SIPAP Pt Type Adult  BiPAP/CPAP/SIPAP SERVO  Mask Type Full face mask  Mask Size Small  Set Rate 15 breaths/min  Respiratory Rate 38 breaths/min  IPAP 10 cmH20  EPAP 5 cmH2O  FiO2 (%) 40 %  Minute Ventilation 10.8  Leak 74  Peak Inspiratory Pressure (PIP) 14  Tidal Volume (Vt) 354  Patient Home Machine No  Patient Home Mask No  Patient Home Tubing No  Device Plugged into RED Power Outlet Yes  BiPAP/CPAP /SiPAP Vitals  Bilateral Breath Sounds Diminished;Expiratory wheezes

## 2023-12-20 NOTE — Progress Notes (Signed)
   12/20/23 2347  Therapy Vitals  Pulse Rate (!) 105  Resp 17  Patient Position (if appropriate) Lying  MEWS Score/Color  MEWS Score 1  MEWS Score Color Green  Respiratory Assessment  Assessment Type Assess only  Respiratory Pattern Regular;Unlabored  Chest Assessment Chest expansion symmetrical  Cough Non-productive  Bilateral Breath Sounds Clear;Diminished  R Upper  Breath Sounds Clear;Diminished  L Upper Breath Sounds Clear;Diminished  R Lower Breath Sounds Diminished  L Lower Breath Sounds Diminished  Oxygen Therapy/Pulse Ox  O2 Device (S)  Room Air  SpO2 100 %

## 2023-12-21 ENCOUNTER — Other Ambulatory Visit: Payer: Self-pay

## 2023-12-21 ENCOUNTER — Encounter (HOSPITAL_COMMUNITY): Payer: Self-pay | Admitting: Internal Medicine

## 2023-12-21 DIAGNOSIS — D6869 Other thrombophilia: Secondary | ICD-10-CM | POA: Diagnosis present

## 2023-12-21 DIAGNOSIS — I959 Hypotension, unspecified: Secondary | ICD-10-CM | POA: Diagnosis present

## 2023-12-21 DIAGNOSIS — I083 Combined rheumatic disorders of mitral, aortic and tricuspid valves: Secondary | ICD-10-CM | POA: Diagnosis present

## 2023-12-21 DIAGNOSIS — E785 Hyperlipidemia, unspecified: Secondary | ICD-10-CM | POA: Diagnosis present

## 2023-12-21 DIAGNOSIS — I4891 Unspecified atrial fibrillation: Secondary | ICD-10-CM | POA: Diagnosis present

## 2023-12-21 DIAGNOSIS — Z604 Social exclusion and rejection: Secondary | ICD-10-CM | POA: Diagnosis present

## 2023-12-21 DIAGNOSIS — D72829 Elevated white blood cell count, unspecified: Secondary | ICD-10-CM | POA: Diagnosis present

## 2023-12-21 DIAGNOSIS — I5023 Acute on chronic systolic (congestive) heart failure: Secondary | ICD-10-CM | POA: Diagnosis not present

## 2023-12-21 DIAGNOSIS — I480 Paroxysmal atrial fibrillation: Secondary | ICD-10-CM | POA: Diagnosis not present

## 2023-12-21 DIAGNOSIS — D591 Autoimmune hemolytic anemia, unspecified: Secondary | ICD-10-CM | POA: Diagnosis present

## 2023-12-21 DIAGNOSIS — G629 Polyneuropathy, unspecified: Secondary | ICD-10-CM | POA: Diagnosis present

## 2023-12-21 DIAGNOSIS — I509 Heart failure, unspecified: Secondary | ICD-10-CM | POA: Diagnosis not present

## 2023-12-21 DIAGNOSIS — R011 Cardiac murmur, unspecified: Secondary | ICD-10-CM | POA: Diagnosis present

## 2023-12-21 DIAGNOSIS — J9601 Acute respiratory failure with hypoxia: Secondary | ICD-10-CM | POA: Diagnosis present

## 2023-12-21 DIAGNOSIS — G43909 Migraine, unspecified, not intractable, without status migrainosus: Secondary | ICD-10-CM | POA: Diagnosis present

## 2023-12-21 DIAGNOSIS — R7989 Other specified abnormal findings of blood chemistry: Secondary | ICD-10-CM

## 2023-12-21 DIAGNOSIS — I5031 Acute diastolic (congestive) heart failure: Secondary | ICD-10-CM | POA: Diagnosis not present

## 2023-12-21 DIAGNOSIS — M109 Gout, unspecified: Secondary | ICD-10-CM | POA: Diagnosis present

## 2023-12-21 DIAGNOSIS — M1990 Unspecified osteoarthritis, unspecified site: Secondary | ICD-10-CM | POA: Diagnosis present

## 2023-12-21 DIAGNOSIS — M353 Polymyalgia rheumatica: Secondary | ICD-10-CM | POA: Diagnosis present

## 2023-12-21 DIAGNOSIS — E739 Lactose intolerance, unspecified: Secondary | ICD-10-CM | POA: Diagnosis present

## 2023-12-21 DIAGNOSIS — I340 Nonrheumatic mitral (valve) insufficiency: Secondary | ICD-10-CM | POA: Diagnosis not present

## 2023-12-21 DIAGNOSIS — I499 Cardiac arrhythmia, unspecified: Secondary | ICD-10-CM | POA: Diagnosis present

## 2023-12-21 DIAGNOSIS — F419 Anxiety disorder, unspecified: Secondary | ICD-10-CM | POA: Diagnosis present

## 2023-12-21 DIAGNOSIS — I4819 Other persistent atrial fibrillation: Secondary | ICD-10-CM | POA: Diagnosis present

## 2023-12-21 DIAGNOSIS — I5033 Acute on chronic diastolic (congestive) heart failure: Secondary | ICD-10-CM | POA: Diagnosis present

## 2023-12-21 DIAGNOSIS — M316 Other giant cell arteritis: Secondary | ICD-10-CM | POA: Diagnosis present

## 2023-12-21 DIAGNOSIS — I2489 Other forms of acute ischemic heart disease: Secondary | ICD-10-CM | POA: Diagnosis present

## 2023-12-21 DIAGNOSIS — F32A Depression, unspecified: Secondary | ICD-10-CM | POA: Diagnosis present

## 2023-12-21 DIAGNOSIS — J45909 Unspecified asthma, uncomplicated: Secondary | ICD-10-CM | POA: Diagnosis present

## 2023-12-21 DIAGNOSIS — I110 Hypertensive heart disease with heart failure: Secondary | ICD-10-CM | POA: Diagnosis present

## 2023-12-21 LAB — BASIC METABOLIC PANEL WITH GFR
Anion gap: 12 (ref 5–15)
BUN: 9 mg/dL (ref 8–23)
CO2: 24 mmol/L (ref 22–32)
Calcium: 7.9 mg/dL — ABNORMAL LOW (ref 8.9–10.3)
Chloride: 99 mmol/L (ref 98–111)
Creatinine, Ser: 0.88 mg/dL (ref 0.44–1.00)
GFR, Estimated: >60 mL/min (ref >60)
Glucose, Bld: 129 mg/dL — ABNORMAL HIGH (ref 70–99)
Potassium: 3.4 mmol/L — ABNORMAL LOW (ref 3.5–5.1)
Sodium: 135 mmol/L (ref 135–145)

## 2023-12-21 LAB — CBC
HCT: 36.5 % (ref 36.0–46.0)
Hemoglobin: 11.9 g/dL — ABNORMAL LOW (ref 12.0–15.0)
MCH: 32.1 pg (ref 26.0–34.0)
MCHC: 32.6 g/dL (ref 30.0–36.0)
MCV: 98.4 fL (ref 80.0–100.0)
Platelets: 290 10*3/uL (ref 150–400)
RBC: 3.71 MIL/uL — ABNORMAL LOW (ref 3.87–5.11)
RDW: 15.9 % — ABNORMAL HIGH (ref 11.5–15.5)
WBC: 14.1 10*3/uL — ABNORMAL HIGH (ref 4.0–10.5)
nRBC: 0.1 % (ref 0.0–0.2)

## 2023-12-21 LAB — MAGNESIUM: Magnesium: 2.3 mg/dL (ref 1.7–2.4)

## 2023-12-21 LAB — TSH: TSH: 1.851 u[IU]/mL (ref 0.350–4.500)

## 2023-12-21 LAB — TROPONIN I (HIGH SENSITIVITY): Troponin I (High Sensitivity): 544 ng/L (ref <18)

## 2023-12-21 LAB — T4, FREE: Free T4: 1.2 ng/dL — ABNORMAL HIGH (ref 0.61–1.12)

## 2023-12-21 MED ORDER — ACETAMINOPHEN 325 MG PO TABS
650.0000 mg | ORAL_TABLET | Freq: Once | ORAL | Status: AC
  Administered 2023-12-21: 650 mg via ORAL
  Filled 2023-12-21: qty 2

## 2023-12-21 MED ORDER — ROSUVASTATIN CALCIUM 5 MG PO TABS
5.0000 mg | ORAL_TABLET | ORAL | Status: DC
  Administered 2023-12-21 – 2023-12-25 (×3): 5 mg via ORAL
  Filled 2023-12-21 (×4): qty 1

## 2023-12-21 MED ORDER — METOPROLOL SUCCINATE ER 50 MG PO TB24
50.0000 mg | ORAL_TABLET | Freq: Two times a day (BID) | ORAL | Status: DC
Start: 2023-12-21 — End: 2023-12-26
  Administered 2023-12-21 – 2023-12-26 (×8): 50 mg via ORAL
  Filled 2023-12-21: qty 2
  Filled 2023-12-21 (×10): qty 1

## 2023-12-21 MED ORDER — FUROSEMIDE 10 MG/ML IJ SOLN
40.0000 mg | Freq: Two times a day (BID) | INTRAMUSCULAR | Status: DC
  Administered 2023-12-21 – 2023-12-24 (×7): 40 mg via INTRAVENOUS
  Filled 2023-12-21 (×7): qty 4

## 2023-12-21 MED ORDER — GABAPENTIN 100 MG PO CAPS
200.0000 mg | ORAL_CAPSULE | Freq: Two times a day (BID) | ORAL | Status: DC
  Administered 2023-12-21 – 2023-12-22 (×4): 200 mg via ORAL
  Filled 2023-12-21 (×4): qty 2

## 2023-12-21 MED ORDER — PREDNISONE 5 MG PO TABS
5.0000 mg | ORAL_TABLET | Freq: Every day | ORAL | Status: DC
  Administered 2023-12-21 – 2023-12-26 (×6): 5 mg via ORAL
  Filled 2023-12-21 (×7): qty 1

## 2023-12-21 MED ORDER — LACTASE 3000 UNITS PO TABS
9000.0000 [IU] | ORAL_TABLET | Freq: Three times a day (TID) | ORAL | Status: DC
  Administered 2023-12-21 – 2023-12-26 (×14): 9000 [IU] via ORAL
  Filled 2023-12-21 (×15): qty 3

## 2023-12-21 MED ORDER — LACTASE 3000 UNITS PO TABS
6000.0000 [IU] | ORAL_TABLET | Freq: Three times a day (TID) | ORAL | Status: DC
  Filled 2023-12-21: qty 2

## 2023-12-21 MED ORDER — FOLIC ACID 1 MG PO TABS
1.0000 mg | ORAL_TABLET | Freq: Every day | ORAL | Status: DC
  Administered 2023-12-21 – 2023-12-26 (×6): 1 mg via ORAL
  Filled 2023-12-21 (×6): qty 1

## 2023-12-21 MED ORDER — POTASSIUM CHLORIDE CRYS ER 20 MEQ PO TBCR
40.0000 meq | EXTENDED_RELEASE_TABLET | Freq: Once | ORAL | Status: AC
  Administered 2023-12-21: 40 meq via ORAL
  Filled 2023-12-21: qty 2

## 2023-12-21 MED ORDER — APIXABAN 5 MG PO TABS
5.0000 mg | ORAL_TABLET | Freq: Two times a day (BID) | ORAL | Status: DC
  Administered 2023-12-21: 5 mg via ORAL
  Filled 2023-12-21: qty 1

## 2023-12-21 MED ORDER — ALPRAZOLAM 0.25 MG PO TABS
0.2500 mg | ORAL_TABLET | Freq: Two times a day (BID) | ORAL | Status: DC | PRN
  Administered 2023-12-22 – 2023-12-23 (×3): 0.25 mg via ORAL
  Filled 2023-12-21 (×4): qty 1

## 2023-12-21 MED ORDER — ENOXAPARIN SODIUM 80 MG/0.8ML IJ SOSY
80.0000 mg | PREFILLED_SYRINGE | Freq: Two times a day (BID) | INTRAMUSCULAR | Status: DC
  Administered 2023-12-21 – 2023-12-22 (×3): 80 mg via SUBCUTANEOUS
  Filled 2023-12-21 (×4): qty 0.8

## 2023-12-21 MED ORDER — DIGOXIN 125 MCG PO TABS
0.1250 mg | ORAL_TABLET | Freq: Every day | ORAL | Status: DC
  Administered 2023-12-21 – 2023-12-26 (×6): 0.125 mg via ORAL
  Filled 2023-12-21 (×7): qty 1

## 2023-12-21 MED ORDER — VITAMIN D 25 MCG (1000 UNIT) PO TABS
1000.0000 [IU] | ORAL_TABLET | ORAL | Status: DC
  Administered 2023-12-21 – 2023-12-25 (×3): 1000 [IU] via ORAL
  Filled 2023-12-21 (×3): qty 1

## 2023-12-21 NOTE — ED Notes (Signed)
 RT called to eval pt after this RN brought up concerns for pt breathing being labored to Dr.Kakrakandy.

## 2023-12-21 NOTE — Hospital Course (Signed)
 78 y.o. female with history of polymyalgia rheumatica and autoimmune hemolytic anemia status post splenectomy on prednisone  was admitted for A-fib with RVR in the month of March 2025 after patient had a cortisone shot to her knee presents to the ER with complaints of shortness of breath and palpitations.  During the admission in March 2025 plan was to cardiovert but 2D echo showed left atrial appendage thrombus so DCCV was deferred at that time.  Patient had TEE done yesterday which showed EF of 65% and no further thrombus and patient had cardioversion yesterday.  Patient states she did well after the cardioversion and today while shopping in [place] started having palpitation and shortness of breath which progressively got worse towards the evening and decided to come to the ER.  Denies any chest pain fever chills or productive cough.   ED Course: In ER patient was in A-fib with RVR hypoxic requiring BiPAP.  Chest x-ray shows pleural effusion and congestion concerning for fluid overload.  Labs show WBC count of 18.8 BNP of 193 troponin 28.  ER physician discussed with on-call cardiologist and cardiology requested patient being started on amiodarone  infusion and to continue the digoxin .  Lasix  40 mg IV was given.

## 2023-12-21 NOTE — Progress Notes (Signed)
 RT evaluated pt, pt is not in respiratory distress at this time, is A&Ox4, rr = 19 & SPO2= 94%  Pt states she does not have any difficulty breathing and pt is able to speak in full sentences.     12/21/23 0525  Therapy Vitals  BP (!) 94/47  Patient Position (if appropriate) Lying  Oxygen Therapy/Pulse Ox  O2 Device Nasal Cannula  O2 Flow Rate (L/min) 2 L/min

## 2023-12-21 NOTE — Plan of Care (Signed)

## 2023-12-21 NOTE — Progress Notes (Signed)
  Progress Note   Patient: Michelle Wilcox ZOX:[id number] DOB: [birth year] DOA: 12/20/2023     0 DOS: the patient was seen and examined on 12/21/2023   Brief hospital course: 78 y.o. female with history of polymyalgia rheumatica and autoimmune hemolytic anemia status post splenectomy on prednisone  was admitted for A-fib with RVR in the month of March 2025 after patient had a cortisone shot to her knee presents to the ER with complaints of shortness of breath and palpitations.  During the admission in March 2025 plan was to cardiovert but 2D echo showed left atrial appendage thrombus so DCCV was deferred at that time.  Patient had TEE done yesterday which showed EF of 65% and no further thrombus and patient had cardioversion yesterday.  Patient states she did well after the cardioversion and today while shopping in [place] started having palpitation and shortness of breath which progressively got worse towards the evening and decided to come to the ER.  Denies any chest pain fever chills or productive cough.   ED Course: In ER patient was in A-fib with RVR hypoxic requiring BiPAP.  Chest x-ray shows pleural effusion and congestion concerning for fluid overload.  Labs show WBC count of 18.8 BNP of 193 troponin 28.  ER physician discussed with on-call cardiologist and cardiology requested patient being started on amiodarone  infusion and to continue the digoxin .  Lasix  40 mg IV was given.  Assessment and Plan: A-fib with RVR  Cardiology following. Currently on IV amio with continued toprol  50mg   Per Cardiology, consider EP eval as outpatient for ablation When seen this AM, noted to be rate controlled on IV amio Follow lytes and correct as needed Continued on therapeutic lovenox  at this time Acute on chronic HFrEF  RecentTEE done yesterday showed EF of 60 to 65%.   Continue with IV lasix  40mg  daily per Cardiology Hypertension  BP stable and controlled at this time Cont betablocker  Polymyalgia  rheumatica and history of autoimmune hemolytic anemia on prednisone . Hyperlipidemia Cont on statin Leukocytosis  Hemodynamically stable  On prednisone  Recheck cbc in AM      Subjective: Denies chest pain when seen this AM  Physical Exam: Vitals:   12/21/23 1439 12/21/23 1509 12/21/23 1710 12/21/23 1813  BP: (!) 107/53 (!) 99/53 123/62   Pulse: 68 65 66 71  Resp:   20   Temp:   98 F (36.7 C)   TempSrc:   Oral   SpO2: 92% 93% 94% 92%  Weight:      Height:       General exam: Awake, laying in bed, in nad Respiratory system: Normal respiratory effort, no wheezing Cardiovascular system: regular rate, s1, s2 Gastrointestinal system: Soft, nondistended, positive BS Central nervous system: CN2-12 grossly intact, strength intact Extremities: Perfused, no clubbing Skin: Normal skin turgor, no notable skin lesions seen Psychiatry: Mood normal // no visual hallucinations   Data Reviewed:  Labs reviewed: na 135, K 3.4, Cr 0.88, WBC 14.1, hgb 11.9  Family Communication: Pt in room, family not at bedside  Disposition: Status is: Observation The patient will require care spanning > 2 midnights and should be moved to inpatient because: severity of illness  Planned Discharge Destination: Home    Author: Cherylle Corwin, MD 12/21/2023 6:31 PM  For on call review [email or web address].

## 2023-12-21 NOTE — ED Notes (Signed)
 Date and time results received: 12/21/23 0130 (use smartphrase ".now" to insert current time)  Test: troponin Critical Value: 544  Name of Provider Notified: Zack Hero, MD  Orders Received? Or Actions Taken?:  n/a

## 2023-12-21 NOTE — H&P (Addendum)
 History and Physical    [name] ZOX:[id number] DOB: 1946-05-18 DOA: 12/20/2023  Patient coming from: Home.  Chief Complaint: Palpitations shortness of breath.  HPI: Michelle Wilcox is a 78 y.o. female with history of polymyalgia rheumatica and autoimmune hemolytic anemia status post splenectomy on prednisone  was admitted for A-fib with RVR in the month of March 2025 after patient had a cortisone shot to her knee presents to the ER with complaints of shortness of breath and palpitations.  During the admission in March 2025 plan was to cardiovert but 2D echo showed left atrial appendage thrombus so DCCV was deferred at that time.  Patient had TEE done yesterday which showed EF of 65% and no further thrombus and patient had cardioversion yesterday.  Patient states she did well after the cardioversion and today while shopping in [place] started having palpitation and shortness of breath which progressively got worse towards the evening and decided to come to the ER.  Denies any chest pain fever chills or productive cough.  ED Course: In ER patient was in A-fib with RVR hypoxic requiring BiPAP.  Chest x-ray shows pleural effusion and congestion concerning for fluid overload.  Labs show WBC count of 18.8 BNP of 193 troponin 28.  ER physician discussed with on-call cardiologist and cardiology requested patient being started on amiodarone  infusion and to continue the digoxin .  Lasix  40 mg IV was given.  Review of Systems: As per HPI, rest all negative.   Past Medical History:  Diagnosis Date   Anxiety    hx of   Asthma    at times   CHF (congestive heart failure) (HCC)    Depression    hx of   Dyspnea    Giant cell arteritis (HCC)    Gouty arthropathy    Hemolytic anemia (HCC)    Hyperlipidemia    Hypertension    Irregular heart beat    extra beat   Migraines    Polymyalgia (HCC)    Polymyalgia rheumatica (HCC)    Temporal arteritis (HCC)     Past Surgical  History:  Procedure Laterality Date   BARTHOLIN GLAND CYST EXCISION     non ca   BREAST BIOPSY Right    CARDIOVERSION N/A 11/05/2023   Procedure: CARDIOVERSION;  Surgeon: Jerryl Morin, DO;  Location: [facility];  Service: Cardiovascular;  Laterality: N/A;   CARDIOVERSION N/A 12/19/2023   Procedure: CARDIOVERSION;  Surgeon: Jann Melody, MD;  Location: [facility];  Service: Cardiovascular;  Laterality: N/A;   EYE MUSCLE SURGERY     as a child 61 yrs old   KNEE SURGERY  2004   rt knee   LAPAROSCOPIC SPLENECTOMY N/A 06/18/2018   Procedure: LAPAROSCOPIC SPLENECTOMY ERAS PATHWAY;  Surgeon: Ayesha Lente, MD;  Location: [facility];  Service: General;  Laterality: N/A;   toncil     TONSILLECTOMY     as a child   TRANSESOPHAGEAL ECHOCARDIOGRAM (CATH LAB) N/A 11/05/2023   Procedure: TRANSESOPHAGEAL ECHOCARDIOGRAM;  Surgeon: Jerryl Morin, DO;  Location: [facility];  Service: Cardiovascular;  Laterality: N/A;   TRANSESOPHAGEAL ECHOCARDIOGRAM (CATH LAB) N/A 12/19/2023   Procedure: TRANSESOPHAGEAL ECHOCARDIOGRAM;  Surgeon: Jann Melody, MD;  Location: [facility];  Service: Cardiovascular;  Laterality: N/A;     reports that she has never smoked. She has never used smokeless tobacco. She reports current alcohol use of about 1.0 standard drink of alcohol per week. She reports that she does not use  drugs.  Allergies  Allergen Reactions   Penicillins Anaphylaxis   Almond (Diagnostic) Hives   Ceftin [Cefuroxime Axetil] Hives and Other (See Comments)    headaches   Ciprofloxacin Hives and Other (See Comments)    headaches   Darvocet [Propoxyphene N-Acetaminophen ] Other (See Comments)    headaches   Lactose Intolerance (Gi) Diarrhea   Levaquin [Levofloxacin In D5w] Hives   Sulfa Antibiotics Hives   Sulfamethoxazole-Trimethoprim Hives   Zyrtec [Cetirizine Hcl] Other (See Comments)    headache   Other Other (See Comments)    Powder in gloves -  Rash  MSG - Hives  Swiss Cheese - Hives       Family History  Adopted: Yes  Problem Relation Age of Onset   Arthritis Mother    Stroke Mother    Other Mother        Splenectomy due to unknown reason.    Prior to Admission medications   Medication Sig Start Date End Date Taking? Authorizing Provider  acetaminophen  (TYLENOL ) 650 MG CR tablet Take 1,300 mg by mouth 2 (two) times daily.    [provider]  ALPRAZolam  (XANAX ) 0.25 MG tablet Take 0.25 mg by mouth 2 (two) times daily as needed for anxiety. 11/18/23   [provider]  amiodarone  (PACERONE ) 200 MG tablet Take 2 tablets (400 mg total) by mouth 2 (two) times daily for 3 days, THEN 1 tablet (200 mg total) 2 (two) times daily for 14 days, THEN 1 tablet (200 mg total) daily. Patient taking differently:  1 tablet (200 mg total) daily. 11/11/23 01/17/24  Lesa Rape, MD  apixaban  (ELIQUIS ) 5 MG TABS tablet Take 1 tablet (5 mg total) by mouth 2 (two) times daily. 11/19/23 05/17/24  West, Katlyn D, NP  Cholecalciferol  (VITAMIN D3) 25 MCG (1000 UT) CAPS Take 1,000 Units by mouth every other day.    [provider]  clindamycin (CLEOCIN) 150 MG capsule Take 600 mg by mouth See admin instructions. Take prior to Dental Procedures 11/25/23   [provider]  digoxin  (LANOXIN ) 0.125 MG tablet Take 1 tablet (0.125 mg total) by mouth daily. 12/05/23   West, Katlyn D, NP  diphenhydrAMINE  (BENADRYL ) 25 MG tablet Take 25-50 mg by mouth daily as needed for allergies. 01/06/22   [provider]  folic acid  (FOLVITE ) 1 MG tablet TAKE 1 TABLET(1 MG) BY MOUTH DAILY 06/12/23   Sumner Ends, MD  gabapentin  (NEURONTIN ) 100 MG capsule Take 200 mg by mouth 2 (two) times daily.    [provider]  lactase (LACTAID) 3000 units tablet Take 9,000-15,000 Units by mouth as needed (consuming dairy products).    [provider]  loperamide  (IMODIUM  A-D) 2 MG tablet Take 2-4 mg by mouth as needed for diarrhea  or loose stools (takes 2 if needed).    [provider]  loratadine  (CLARITIN ) 10 MG tablet Take 10 mg by mouth daily.    [provider]  metoprolol  succinate (TOPROL -XL) 50 MG 24 hr tablet Take 1 tablet (50 mg total) by mouth 2 (two) times daily. Take with or immediately following a meal. 11/19/23 05/17/24  West, Katlyn D, NP  Multiple Vitamin (MULTIVITAMIN) tablet Take 1 tablet by mouth daily.    [provider]  predniSONE  (DELTASONE ) 5 MG tablet Take 1 tablet (5 mg total) by mouth daily with breakfast. 10/02/23   Sumner Ends, MD  Probiotic Product (ALIGN) 4 MG CAPS Take 4 mg by mouth every morning.    [provider]  rosuvastatin  (CRESTOR ) 5 MG tablet Take 5 mg by mouth every other day.    [provider]  simethicone  (MYLICON) 80 MG chewable tablet Chew 80-160 mg by mouth as needed for flatulence.    [provider]  VENTOLIN  HFA 108 (90 Base) MCG/ACT inhaler Take 1-2 puffs by mouth every 4 (four) hours as needed for shortness of breath. 06/24/17   [provider]    Physical Exam: Constitutional: Moderately built and nourished. Vitals:   12/20/23 2330 12/20/23 2345 12/20/23 2347 12/21/23 0000  BP: 117/63 93/70  (!) 103/58  Pulse: (!) 111 (!) 109 (!) 105 (!) 113  Resp: 18 (!) 21 17 (!) 28  Temp:      SpO2: 100% 100% 100% 92%  Weight:      Height:       Eyes: Anicteric no pallor. ENMT: No discharge from the ears/nose or mouth. Neck: No mass felt.  No neck rigidity.  JVD mildly elevated. Respiratory: No rhonchi or crepitations. Cardiovascular: S1-S2 heard. Abdomen: Soft nontender bowel sound present. Musculoskeletal: No edema. Skin: No rash. Neurologic: Alert awake oriented to time place and person.  Moves all extremities. Psychiatric: Appears normal.  Normal affect.   Labs on Admission: I have personally reviewed following labs and imaging studies  CBC: Recent Labs  Lab 12/20/23 2128 12/20/23 2137  12/20/23 2206  WBC 18.8*  --   --   NEUTROABS 13.7*  --   --   HGB 14.3 15.3* 16.0*  HCT 44.8 45.0 47.0*  MCV 100.2*  --   --   PLT 354  --   --    Basic Metabolic Panel: Recent Labs  Lab 12/20/23 2128 12/20/23 2137 12/20/23 2206  NA 135 136 137  K 3.9 3.7 3.6  CL 101  --  100  CO2 22  --   --   GLUCOSE 117*  --  114*  BUN 8  --  9  CREATININE 0.73  --  0.60  CALCIUM  8.4*  --   --    GFR: Estimated Creatinine Clearance: 59.1 mL/min (by C-G formula based on SCr of 0.6 mg/dL). Liver Function Tests: Recent Labs  Lab 12/20/23 2128  AST 46*  ALT 18  ALKPHOS 63  BILITOT 0.9  PROT 6.5  ALBUMIN 4.1   No results for input(s): "LIPASE", "AMYLASE" in the last 168 hours. No results for input(s): "AMMONIA" in the last 168 hours. Coagulation Profile: No results for input(s): "INR", "PROTIME" in the last 168 hours. Cardiac Enzymes: No results for input(s): "CKTOTAL", "CKMB", "CKMBINDEX", "TROPONINI" in the last 168 hours. BNP (last 3 results) No results for input(s): "PROBNP" in the last 8760 hours. HbA1C: No results for input(s): "HGBA1C" in the last 72 hours. CBG: No results for input(s): "GLUCAP" in the last 168 hours. Lipid Profile: No results for input(s): "CHOL", "HDL", "LDLCALC", "TRIG", "CHOLHDL", "LDLDIRECT" in the last 72 hours. Thyroid  Function Tests: No results for input(s): "TSH", "T4TOTAL", "FREET4", "T3FREE", "THYROIDAB" in the last 72 hours. Anemia Panel: No results for input(s): "VITAMINB12", "FOLATE", "FERRITIN", "TIBC", "IRON", "RETICCTPCT" in the last 72 hours. Urine analysis:    Component Value Date/Time   COLORURINE YELLOW 08/03/2017 2321   APPEARANCEUR CLEAR 08/03/2017 2321   LABSPEC 1.020 08/03/2017 2321   LABSPEC 1.025 03/27/2016 0816   PHURINE 5.5 08/03/2017 2321   GLUCOSEU NEGATIVE 08/03/2017 2321   GLUCOSEU Negative 03/27/2016 0816   HGBUR NEGATIVE 08/03/2017 2321   BILIRUBINUR NEGATIVE 08/03/2017 2321   BILIRUBINUR Positive  by Dipstix  03/27/2016 0816   KETONESUR TRACE (A) 08/03/2017 2321   PROTEINUR NEGATIVE 08/03/2017 2321   UROBILINOGEN 0.2 03/27/2016 0816   NITRITE NEGATIVE 08/03/2017 2321   LEUKOCYTESUR NEGATIVE 08/03/2017 2321   LEUKOCYTESUR Small 03/27/2016 0816   Sepsis Labs: @LABRCNTIP (procalcitonin:4,lacticidven:4) )No results found for this or any previous visit (from the past 240 hours).   Radiological Exams on Admission: DG Chest Port 1 View Result Date: 12/20/2023 CLINICAL DATA:  Shortness of breath EXAM: PORTABLE CHEST 1 VIEW COMPARISON:  11/04/2023 FINDINGS: Cardiac enlargement with pulmonary vascular congestion and perihilar/basilar infiltrates likely edema. This is progressing since prior study. Possible small pleural effusions. No pneumothorax. Mediastinal contours appear intact. Degenerative changes in the spine and shoulders. IMPRESSION: Cardiac enlargement with pulmonary vascular congestion and progressing edema since prior study. Probable small pleural effusions. Electronically Signed   By: Boyce Byes M.D.   On: 12/20/2023 21:45   ECHO TEE Result Date: 12/19/2023    TRANSESOPHOGEAL ECHO REPORT   Patient Name:   Temari Whinnery Date of Exam: 12/19/2023 Medical Rec #:  [id number]               Height:       63.0 in Accession #:    [id number]              Weight:       184.0 lb Date of Birth:  01/21/1946               BSA:          1.866 m Patient Age:    77 years                BP:           156/89 mmHg Patient Gender: F                       HR:           102 bpm. Exam Location:  Inpatient Procedure: Transesophageal Echo, 3D Echo, Color Doppler, Cardiac Doppler and            Intracardiac Opacification Agent (Both Spectral and Color Flow            Doppler were utilized during procedure). Indications:     I48.91* Unspecified atrial fibrillation  History:         Patient has prior history of Echocardiogram examinations, most                  recent 11/05/2023. Arrythmias:Atrial Fibrillation; Risk                   Factors:Hypertension and Dyslipidemia.  Sonographer:     Sherline Distel Senior RDCS Referring Phys:  KATLYN D WEST Diagnosing Phys: Gloriann Larger MD PROCEDURE: After discussion of the risks and benefits of a TEE, an informed consent was obtained from the patient. The transesophogeal probe was passed without difficulty through the esophogus of the patient. Sedation performed by different physician. The patient was monitored while under deep sedation. Anesthestetic sedation was provided intravenously by Anesthesiology: 160mg  of Propofol . The patient developed no complications during the procedure. A successful direct current cardioversion was performed at 200 joules with 1 attempt.  IMPRESSIONS  1. Left ventricular ejection fraction, by estimation, is 60 to 65%. Left ventricular ejection fraction by 3D volume is 65 %. The left ventricle has normal function.  2. Right ventricular systolic function is normal. The right ventricular size is mildly  enlarged.  3. Left atrial size was severely dilated. No left atrial/left atrial appendage thrombus was detected.  4. Right atrial size was severely dilated.  5. Central jet of atrial, functional MR with blunted pulmonary vein signal. The mitral valve is normal in structure. Moderate to severe mitral valve regurgitation. No evidence of mitral stenosis.  6. Atrial functional tricuspid regurgitation- two jets. Tricuspid valve regurgitation is moderate.  7. There is a larger central jet and a smaller, commissural jet between the non and left cusp; central jet 2DVC: 0.3 cm. The aortic valve is tricuspid. Aortic valve regurgitation is moderate. No aortic stenosis is present.  8. 3D performed of the LAA and demonstrates Patent LAA- Chicken wing morphology. 23 mm maximal diameter.  9. Cannot exclude a small PFO. FINDINGS  Left Ventricle: Left ventricular ejection fraction, by estimation, is 60 to 65%. Left ventricular ejection fraction by 3D volume is 65 %. The left ventricle  has normal function. Definity  contrast agent was given IV to delineate the left ventricular endocardial borders. The left ventricular internal cavity size was normal in size. Right Ventricle: The right ventricular size is mildly enlarged. No increase in right ventricular wall thickness. Right ventricular systolic function is normal. Left Atrium: Left atrial size was severely dilated. No left atrial/left atrial appendage thrombus was detected. Right Atrium: Right atrial size was severely dilated. Pericardium: There is no evidence of pericardial effusion. Mitral Valve: Central jet of atrial, functional MR with blunted pulmonary vein signal. The mitral valve is normal in structure. Moderate to severe mitral valve regurgitation. No evidence of mitral valve stenosis. Tricuspid Valve: Atrial functional tricuspid regurgitation- two jets. The tricuspid valve is normal in structure. Tricuspid valve regurgitation is moderate . No evidence of tricuspid stenosis. Aortic Valve: There is a larger central jet and a smaller, commissural jet between the non and left cusp; central jet 2DVC: 0.3 cm. The aortic valve is tricuspid. Aortic valve regurgitation is moderate. No aortic stenosis is present. Pulmonic Valve: The pulmonic valve was normal in structure. Pulmonic valve regurgitation is trivial. No evidence of pulmonic stenosis. Aorta: The aortic root and ascending aorta are structurally normal, with no evidence of dilitation. IAS/Shunts: There is right bowing of the interatrial septum, suggestive of elevated left atrial pressure. Cannot exclude a small PFO. Additional Comments: 3D was performed not requiring image post processing on an independent workstation and was normal. Spectral Doppler performed.  3D Volume EF LV 3D EF:    Left ventricular ejection fraction by 3D volume is 65              %. LV 3D EDV:   47.36 ml LV 3D ESV:   16.50 ml  3D Volume EF: 3D EF:        65 % Gloriann Larger MD Electronically signed by Gloriann Larger MD Signature Date/Time: 12/19/2023/12:39:33 PM    Final    EP STUDY Result Date: 12/19/2023 See surgical note for result.   EKG: Independently reviewed.  A-fib with RVR.  Assessment/Plan Principal Problem:   Atrial fibrillation with RVR (HCC) Active Problems:   Autoimmune hemolytic anemia (HCC)   Essential hypertension   PMR (polymyalgia rheumatica) (HCC)   Acute on chronic HFrEF (heart failure with reduced ejection fraction) (HCC)   Atrial fibrillation with rapid ventricular response (HCC)    A-fib with RVR -     cardiologist requested patient to be started on amiodarone  infusion.  Patient is on Eliquis  for anticoagulation.  Will continue with digoxin  metoprolol .  Check  troponin and thyroid  function test.  Cardiology to see for further recommendations.  Patient did have cardioversion done yesterday.  Addendum -patient's troponin showed increasing trend and it is around 500 now.  I discussed with on-call cardiologist Dr. Gerre Kraft who advised keeping patient on Lovenox  morning since patient already received Eliquis . Acute on chronic HFrEF with improved EF -   2D echo done in March 2025 showed EF of 40 to 45% with grade 1 diastolic dysfunction.  TEE done yesterday showed EF of 60 to 65%.  Patient likely went into CHF due to A-fib with RVR.  Received 1 dose of Lasix  40 mg IV.  Will closely monitor intake output Daily weights and metabolic panel. Hypertension on beta-blockers. Polymyalgia rheumatica and history of autoimmune hemolytic anemia on prednisone . Hyperlipidemia on statins. Leukocytosis could be from postsplenectomy and prednisone .  Closely monitor.  Since patient has A-fib with RVR and CHF will need close monitoring and more than 2 midnight stay.   DVT prophylaxis: Eliquis . Code Status: Full code. Family Communication: Discussed with patient. Disposition Plan: Monitored bed. Consults called: Cardiology. Admission status: The patient.

## 2023-12-21 NOTE — Consult Note (Signed)
 CARDIOLOGY CONSULT NOTE       Patient ID: Michelle Wilcox MRN: [id number] DOB/AGE: [birth year] 79 y.o.  Admit date: 12/20/2023 Referring Physician: [facility] Primary Physician: [name], MD Primary Cardiologist: [name] Reason for Consultation: CHF/PAF  Principal Problem:   Atrial fibrillation with RVR [facility]) Active Problems:   Autoimmune hemolytic anemia (HCC)   Essential hypertension   PMR (polymyalgia rheumatica) (HCC)   Acute on chronic HFrEF (heart failure with reduced ejection fraction) (HCC)   Atrial fibrillation with rapid ventricular response (HCC)   HPI:  78 y.o. admitted with CHF and recurrent PAF. Patient has history of PMR, temporal arteritis and diastolic dysfunction. Had new onset afib 11/04/23 TEE 11/05/23 suggested LAA thrombus. So not cardioverted Returned 12/19/23 with TEE/DCC converted to NSR  EF 60-65% Severe LAE/RAE moderate to severe MR, moderate TR and moderate AR. Returned to ER with palpitaitons and dyspnea original ECG with afib. Currently on amiodarone  iv in NSR with PAC;s  CXR with CHF initial troponin 544 She is chest pain free. Breathing better with lasix . Rhythm has stabilized. Compliant with eliquis  No documented CAD Myovue 03/11/18 normal no ischemia EF 69%   ROS All other systems reviewed and negative except as noted above  Past Medical History:  Diagnosis Date   Anxiety    hx of   Asthma    at times   CHF (congestive heart failure) (HCC)    Depression    hx of   Dyspnea    Giant cell arteritis (HCC)    Gouty arthropathy    Hemolytic anemia (HCC)    Hyperlipidemia    Hypertension    Irregular heart beat    extra beat   Migraines    Polymyalgia (HCC)    Polymyalgia rheumatica (HCC)    Temporal arteritis (HCC)     Family History  Adopted: Yes  Problem Relation Age of Onset   Arthritis Mother    Stroke Mother    Other Mother        Splenectomy due to unknown reason.    Social History   Socioeconomic History    Marital status: Married    Spouse name: Not on file   Number of children: Not on file   Years of education: Not on file   Highest education level: Not on file  Occupational History   Not on file  Tobacco Use   Smoking status: Never   Smokeless tobacco: Never  Vaping Use   Vaping status: Never Used  Substance and Sexual Activity   Alcohol use: Yes    Alcohol/week: 1.0 standard drink of alcohol    Types: 1 Glasses of wine per week    Comment: occasional   Drug use: Never   Sexual activity: Not Currently  Other Topics Concern   Not on file  Social History Narrative   Not on file   Social Drivers of Health   Financial Resource Strain: Not on file  Food Insecurity: No Food Insecurity (11/04/2023)   Hunger Vital Sign    Worried About Running Out of Food in the Last Year: Never true    Ran Out of Food in the Last Year: Never true  Transportation Needs: No Transportation Needs (11/04/2023)   PRAPARE - Administrator, Civil Service (Medical): No    Lack of Transportation (Non-Medical): No  Physical Activity: Not on file  Stress: Not on file  Social Connections: Socially Isolated (11/04/2023)   Social Connection and Isolation Panel [NHANES]    Frequency  of Communication with Friends and Family: Twice a week    Frequency of Social Gatherings with Friends and Family: Never    Attends Religious Services: Never    Database administrator or Organizations: No    Attends Banker Meetings: Never    Marital Status: Married  Catering manager Violence: Not At Risk (11/04/2023)   Humiliation, Afraid, Rape, and Kick questionnaire    Fear of Current or Ex-Partner: No    Emotionally Abused: No    Physically Abused: No    Sexually Abused: No    Past Surgical History:  Procedure Laterality Date   BARTHOLIN GLAND CYST EXCISION     non ca   BREAST BIOPSY Right    CARDIOVERSION N/A 11/05/2023   Procedure: CARDIOVERSION;  Surgeon: Jerryl Morin, DO;  Location: [facility] INVASIVE  CV LAB;  Service: Cardiovascular;  Laterality: N/A;   CARDIOVERSION N/A 12/19/2023   Procedure: CARDIOVERSION;  Surgeon: Jann Melody, MD;  Location: [facility] INVASIVE CV LAB;  Service: Cardiovascular;  Laterality: N/A;   EYE MUSCLE SURGERY     as a child 36 yrs old   KNEE SURGERY  2004   rt knee   LAPAROSCOPIC SPLENECTOMY N/A 06/18/2018   Procedure: LAPAROSCOPIC SPLENECTOMY ERAS PATHWAY;  Surgeon: Ayesha Lente, MD;  Location: [facility] ORS;  Service: General;  Laterality: N/A;   toncil     TONSILLECTOMY     as a child   TRANSESOPHAGEAL ECHOCARDIOGRAM (CATH LAB) N/A 11/05/2023   Procedure: TRANSESOPHAGEAL ECHOCARDIOGRAM;  Surgeon: Jerryl Morin, DO;  Location: [facility] INVAS[facility] CV LAB;  Service: Cardiovascular;  Laterality: N/A;   TRANSESOPHAGEAL ECHOCARDIOGRAM (CATH LAB) N/A 12/19/2023   Procedure: TRANSESOPHAGEAL ECHOCARDIOGRAM;  Surgeon: Jann Melody, MD;  Location: [facility] CV LAB;  Service: Cardiovascular;  Laterality: N/A;      Current Facility-Administered Medications:    ALPRAZolam  (XANAX ) tablet 0.25 mg, 0.25 mg, Oral, BID PRN, Angelene Kelly, MD   amiodarone  (NEXTERONE  PREMIX) 360-4.14 MG/200ML-% (1.8 mg/mL) IV infusion, 30 mg/hr, Intravenous, Continuous, Angelene Kelly, MD, Last Rate: 16.67 mL/hr at 12/21/23 0812, 30 mg/hr at 12/21/23 3664   cholecalciferol  (VITAMIN D3) 25 MCG (1000 UNIT) tablet 1,000 Units, 1,000 Units, Oral, QODAY, Angelene Kelly, MD   digoxin  (LANOXIN ) tablet 0.125 mg, 0.125 mg, Oral, Daily, Angelene Kelly, MD   enoxaparin  (LOVENOX ) injection 80 mg, 80 mg, Subcutaneous, BID, Kakrakandy, Arshad N, MD   folic acid  (FOLVITE ) tablet 1 mg, 1 mg, Oral, Daily, Kakrakandy, Arshad N, MD   gabapentin  (NEURONTIN ) capsule 200 mg, 200 mg, Oral, BID, Kakrakandy, Arshad N, MD, 200 mg at 12/21/23 4034   metoprolol  succinate (TOPROL -XL) 24 hr tablet 50 mg, 50 mg, Oral, BID, Angelene Kelly, MD   predniSONE  (DELTASONE ) tablet 5 mg, 5 mg,  Oral, Q breakfast, Angelene Kelly, MD, 5 mg at 12/21/23 7425   rosuvastatin  (CRESTOR ) tablet 5 mg, 5 mg, Oral, QODAY, Angelene Kelly, MD  Current Outpatient Medications:    acetaminophen  (TYLENOL ) 650 MG CR tablet, Take 1,300 mg by mouth 2 (two) times daily., Disp: , Rfl:    ALPRAZolam  (XANAX ) 0.25 MG tablet, Take 0.25 mg by mouth 2 (two) times daily as needed for anxiety., Disp: , Rfl:    amiodarone  (PACERONE ) 200 MG tablet, Take 2 tablets (400 mg total) by mouth 2 (two) times daily for 3 days, THEN 1 tablet (200 mg total) 2 (two) times daily for 14 days, THEN 1 tablet (200 mg total) daily. (Patient taking differently:  1 tablet (200 mg total) daily.), Disp: 90 tablet, Rfl: 0   apixaban  (ELIQUIS ) 5 MG TABS tablet, Take 1 tablet (5 mg total) by mouth 2 (two) times daily., Disp: 90 tablet, Rfl: 3   cholecalciferol  (VITAMIN D3) 25 MCG (1000 UNIT) tablet, Take 1,000 Units by mouth at bedtime. Take one capsule by mouth at bedtime every other day., Disp: , Rfl:    clindamycin (CLEOCIN) 150 MG capsule, Take 600 mg by mouth See admin instructions. Take prior to Dental Procedures, Disp: , Rfl:    diclofenac  Sodium (VOLTAREN ) 1 % GEL, Apply 4 g topically as needed (Hands, knees, feet)., Disp: , Rfl:    digoxin  (LANOXIN ) 0.125 MG tablet, Take 1 tablet (0.125 mg total) by mouth daily., Disp: 90 tablet, Rfl: 3   diphenhydrAMINE  (BENADRYL ) 25 MG tablet, Take 25-50 mg by mouth daily as needed for allergies (allergic reactions)., Disp: , Rfl:    folic acid  (FOLVITE ) 1 MG tablet, TAKE 1 TABLET(1 MG) BY MOUTH DAILY, Disp: 90 tablet, Rfl: 3   gabapentin  (NEURONTIN ) 100 MG capsule, Take 200 mg by mouth 2 (two) times daily., Disp: , Rfl:    lactase (LACTAID) 3000 units tablet, Take 9,000-15,000 Units by mouth as needed (consuming dairy products)., Disp: , Rfl:    loratadine  (CLARITIN ) 10 MG tablet, Take 10 mg by mouth daily., Disp: , Rfl:    metoprolol  succinate (TOPROL -XL) 50 MG 24 hr tablet, Take 1 tablet  (50 mg total) by mouth 2 (two) times daily. Take with or immediately following a meal., Disp: 90 tablet, Rfl: 3   Multiple Vitamin (MULTIVITAMIN) tablet, Take 1 tablet by mouth at bedtime. One-A-Day Women's Vitamin, Disp: , Rfl:    predniSONE  (DELTASONE ) 5 MG tablet, Take 1 tablet (5 mg total) by mouth daily with breakfast., Disp: 30 tablet, Rfl: 7   Probiotic Product (ALIGN) 4 MG CAPS, Take 4 mg by mouth every morning., Disp: , Rfl:    rosuvastatin  (CRESTOR ) 5 MG tablet, Take 5 mg by mouth at bedtime. Take one tablet by mouth at bedtime every other day., Disp: , Rfl:    VENTOLIN  HFA 108 (90 Base) MCG/ACT inhaler, Take 1-2 puffs by mouth every 4 (four) hours as needed for shortness of breath., Disp: , Rfl: 0  cholecalciferol   1,000 Units Oral QODAY   digoxin   0.125 mg Oral Daily   enoxaparin  (LOVENOX ) injection  80 mg Subcutaneous BID   folic acid   1 mg Oral Daily   gabapentin   200 mg Oral BID   metoprolol  succinate  50 mg Oral BID   predniSONE   5 mg Oral Q breakfast   rosuvastatin   5 mg Oral QODAY    amiodarone  30 mg/hr (12/21/23 0812)    Physical Exam: BP (!) 114/50   Pulse 86   Temp 98.2 F (36.8 C) (Oral)   Resp 16   Ht 5\' 3"  (1.6 m)   Wt 83 kg   SpO2 95%   BMI 32.41 kg/m   Overweight female Basilar rales Apical MR murmur Abdomen benign Trace edema Mild erythema over [facility] pad sites from 12/19/23    Labs:   Lab Results  Component Value Date   WBC 14.1 (H) 12/21/2023   HGB 11.9 (L) 12/21/2023   HCT 36.5 12/21/2023   MCV 98.4 12/21/2023   PLT 290 12/21/2023    Recent Labs  Lab 12/20/23 2128 12/20/23 2137 12/21/23 0434  NA 135   < > 135  K 3.9   < > 3.4*  CL 101   < >  99  CO2 22  --  24  BUN 8   < > 9  CREATININE 0.73   < > 0.88  CALCIUM  8.4*  --  7.9*  PROT 6.5  --   --   BILITOT 0.9  --   --   ALKPHOS 63  --   --   ALT 18  --   --   AST 46*  --   --   GLUCOSE 117*   < > 129*   < > = values in this interval not displayed.   No results found for:  "CKTOTAL", "CKMB", "CKMBINDEX", "TROPONINI"  Lab Results  Component Value Date   CHOL 145 03/13/2016   CHOL 159 05/24/2015   Lab Results  Component Value Date   HDL 55 03/13/2016   HDL 56 05/24/2015   Lab Results  Component Value Date   LDLCALC 69 03/13/2016   LDLCALC 85 05/24/2015   Lab Results  Component Value Date   TRIG 103 03/13/2016   TRIG 91 05/24/2015   Lab Results  Component Value Date   CHOLHDL 2.6 03/13/2016   CHOLHDL 2.8 05/24/2015   No results found for: "LDLDIRECT"    Radiology: [facility] Chest Port 1 View Result Date: 12/20/2023 CLINICAL DATA:  Shortness of breath EXAM: PORTABLE CHEST 1 VIEW COMPARISON:  11/04/2023 FINDINGS: Cardiac enlargement with pulmonary vascular congestion and perihilar/basilar infiltrates likely edema. This is progressing since prior study. Possible small pleural effusions. No pneumothorax. Mediastinal contours appear intact. Degenerative changes in the spine and shoulders. IMPRESSION: Cardiac enlargement with pulmonary vascular congestion and progressing edema since prior study. Probable small pleural effusions. Electronically Signed   By: Boyce Byes M.D.   On: 12/20/2023 21:45   ECHO TEE Result Date: 12/19/2023    TRANSESOPHOGEAL ECHO REPORT   Patient Name:   Andrianne Vilches Date of Exam: 12/19/2023 Medical Rec #:  [id number]               Height:       63.0 in Accession #:    [id number]              Weight:       184.0 lb Date of Birth:  [birth year]               BSA:          1.866 m Patient Age:    77 years                BP:           156/89 mmHg Patient Gender: F                       HR:           102 bpm. Exam Location:  Inpatient Procedure: Transesophageal Echo, 3D Echo, Color Doppler, Cardiac Doppler and            Intracardiac Opacification Agent (Both Spectral and Color Flow            Doppler were utilized during procedure). Indications:     I48.91* Unspecified atrial fibrillation  History:         Patient has prior history of  Echocardiogram examinations, most                  recent 11/05/2023. Arrythmias:Atrial Fibrillation; Risk                  Factors:Hypertension and Dyslipidemia.  Sonographer:     Sherline Distel Senior RDCS Referring Phys:  KATLYN D WEST Diagnosing Phys: Gloriann Larger MD PROCEDURE: After discussion of the risks and benefits of a TEE, an informed consent was obtained from the patient. The transesophogeal probe was passed without difficulty through the esophogus of the patient. Sedation performed by different physician. The patient was monitored while under deep sedation. Anesthestetic sedation was provided intravenously by Anesthesiology: 160mg  of Propofol . The patient developed no complications during the procedure. A successful direct current cardioversion was performed at 200 joules with 1 attempt.  IMPRESSIONS  1. Left ventricular ejection fraction, by estimation, is 60 to 65%. Left ventricular ejection fraction by 3D volume is 65 %. The left ventricle has normal function.  2. Right ventricular systolic function is normal. The right ventricular size is mildly enlarged.  3. Left atrial size was severely dilated. No left atrial/left atrial appendage thrombus was detected.  4. Right atrial size was severely dilated.  5. Central jet of atrial, functional MR with blunted pulmonary vein signal. The mitral valve is normal in structure. Moderate to severe mitral valve regurgitation. No evidence of mitral stenosis.  6. Atrial functional tricuspid regurgitation- two jets. Tricuspid valve regurgitation is moderate.  7. There is a larger central jet and a smaller, commissural jet between the non and left cusp; central jet 2DVC: 0.3 cm. The aortic valve is tricuspid. Aortic valve regurgitation is moderate. No aortic stenosis is present.  8. 3D performed of the LAA and demonstrates Patent LAA- Chicken wing morphology. 23 mm maximal diameter.  9. Cannot exclude a small PFO. FINDINGS  Left Ventricle: Left ventricular ejection  fraction, by estimation, is 60 to 65%. Left ventricular ejection fraction by 3D volume is 65 %. The left ventricle has normal function. Definity  contrast agent was given IV to delineate the left ventricular endocardial borders. The left ventricular internal cavity size was normal in size. Right Ventricle: The right ventricular size is mildly enlarged. No increase in right ventricular wall thickness. Right ventricular systolic function is normal. Left Atrium: Left atrial size was severely dilated. No left atrial/left atrial appendage thrombus was detected. Right Atrium: Right atrial size was severely dilated. Pericardium: There is no evidence of pericardial effusion. Mitral Valve: Central jet of atrial, functional MR with blunted pulmonary vein signal. The mitral valve is normal in structure. Moderate to severe mitral valve regurgitation. No evidence of mitral valve stenosis. Tricuspid Valve: Atrial functional tricuspid regurgitation- two jets. The tricuspid valve is normal in structure. Tricuspid valve regurgitation is moderate . No evidence of tricuspid stenosis. Aortic Valve: There is a larger central jet and a smaller, commissural jet between the non and left cusp; central jet 2DVC: 0.3 cm. The aortic valve is tricuspid. Aortic valve regurgitation is moderate. No aortic stenosis is present. Pulmonic Valve: The pulmonic valve was normal in structure. Pulmonic valve regurgitation is trivial. No evidence of pulmonic stenosis. Aorta: The aortic root and ascending aorta are structurally normal, with no evidence of dilitation. IAS/Shunts: There is right bowing of the interatrial septum, suggestive of elevated left atrial pressure. Cannot exclude a small PFO. Additional Comments: 3D was performed not requiring image post processing on an independent workstation and was normal. Spectral Doppler performed.  3D Volume EF LV 3D EF:    Left ventricular ejection fraction by 3D volume is 65              %. LV 3D EDV:   47.36  ml LV 3D ESV:   16.50 ml  3D Volume EF: 3D EF:        65 % Gloriann Larger MD Electronically signed by Gloriann Larger MD Signature Date/Time: 12/19/2023/12:39:33 PM    Final    EP STUDY Result Date: 12/19/2023 See surgical note for result.   EKG: SR PAC no acute ST changes    ASSESSMENT AND PLAN:   PAF: continue eliquis  Load with iv amiodarone  Continue Toprol  50 mg Recent [facility] 12/19/23 She has severe bi atrial enlargement and valvular dx so maintaining NSR may be difficult. Consider EP evaluation as outpatient for ablation Valve DX:  she has functional mod/severe MR and moderate TR from severe bi atrial enlargement Likely contributes to CHF and arrhythmia Moderate AR as well. TTE this weekend now that she is in NSR  Elevated Troponin: no chest pain or acute ECG changes ? Related to [facility]. Will need evaluation for CAD. Given recent [facility] would not want to hold DOAC for cath. Likely outpatient Myove PET/CT pending troponin trend  Continue ASA/beta blocker TTE to look for RWMA;s as EF was normal on TEE 12/19/23 CHF:  related to arrhythmia, valve dx lasix  40 mg iv daily. Try to maintain NSR. TTE update related to valvular dx likely as opposed to low EF   Signed: Janelle Mediate 12/21/2023, 9:29 AM

## 2023-12-22 ENCOUNTER — Inpatient Hospital Stay (HOSPITAL_COMMUNITY)

## 2023-12-22 DIAGNOSIS — I5031 Acute diastolic (congestive) heart failure: Secondary | ICD-10-CM

## 2023-12-22 DIAGNOSIS — I4891 Unspecified atrial fibrillation: Secondary | ICD-10-CM | POA: Diagnosis not present

## 2023-12-22 DIAGNOSIS — I509 Heart failure, unspecified: Secondary | ICD-10-CM | POA: Diagnosis not present

## 2023-12-22 LAB — COMPREHENSIVE METABOLIC PANEL WITH GFR
ALT: 12 U/L (ref 0–44)
AST: 21 U/L (ref 15–41)
Albumin: 3.2 g/dL — ABNORMAL LOW (ref 3.5–5.0)
Alkaline Phosphatase: 37 U/L — ABNORMAL LOW (ref 38–126)
Anion gap: 10 (ref 5–15)
BUN: 11 mg/dL (ref 8–23)
CO2: 28 mmol/L (ref 22–32)
Calcium: 8.1 mg/dL — ABNORMAL LOW (ref 8.9–10.3)
Chloride: 103 mmol/L (ref 98–111)
Creatinine, Ser: 0.94 mg/dL (ref 0.44–1.00)
GFR, Estimated: >60 mL/min (ref >60)
Glucose, Bld: 100 mg/dL — ABNORMAL HIGH (ref 70–99)
Potassium: 3.9 mmol/L (ref 3.5–5.1)
Sodium: 141 mmol/L (ref 135–145)
Total Bilirubin: 0.8 mg/dL (ref 0.0–1.2)
Total Protein: 5.4 g/dL — ABNORMAL LOW (ref 6.5–8.1)

## 2023-12-22 LAB — ECHOCARDIOGRAM COMPLETE
AR max vel: 1.75 cm2
AV Area VTI: 2.17 cm2
AV Area mean vel: 2 cm2
AV Mean grad: 4.4 mmHg
AV Peak grad: 11 mmHg
Ao pk vel: 1.66 m/s
Area-P 1/2: 3.6 cm2
Height: 63 in
P 1/2 time: 385 ms
S' Lateral: 3.5 cm
Weight: 2945.6 [oz_av]

## 2023-12-22 LAB — CBC
HCT: 39.2 % (ref 36.0–46.0)
Hemoglobin: 13 g/dL (ref 12.0–15.0)
MCH: 32.5 pg (ref 26.0–34.0)
MCHC: 33.2 g/dL (ref 30.0–36.0)
MCV: 98 fL (ref 80.0–100.0)
Platelets: 288 10*3/uL (ref 150–400)
RBC: 4 MIL/uL (ref 3.87–5.11)
RDW: 15.9 % — ABNORMAL HIGH (ref 11.5–15.5)
WBC: 10.1 10*3/uL (ref 4.0–10.5)
nRBC: 0 % (ref 0.0–0.2)

## 2023-12-22 LAB — MAGNESIUM: Magnesium: 2.1 mg/dL (ref 1.7–2.4)

## 2023-12-22 LAB — URIC ACID: Uric Acid, Serum: 6.4 mg/dL (ref 2.5–7.1)

## 2023-12-22 LAB — T3, FREE: T3, Free: 2.3 pg/mL (ref 2.0–4.4)

## 2023-12-22 MED ORDER — GABAPENTIN 300 MG PO CAPS
300.0000 mg | ORAL_CAPSULE | Freq: Two times a day (BID) | ORAL | Status: DC
  Administered 2023-12-22 – 2023-12-26 (×8): 300 mg via ORAL
  Filled 2023-12-22 (×8): qty 1

## 2023-12-22 MED ORDER — DOCUSATE SODIUM 100 MG PO CAPS
100.0000 mg | ORAL_CAPSULE | Freq: Two times a day (BID) | ORAL | Status: DC
  Administered 2023-12-22 – 2023-12-24 (×2): 100 mg via ORAL
  Filled 2023-12-22 (×6): qty 1

## 2023-12-22 MED ORDER — ACETAMINOPHEN 325 MG PO TABS
650.0000 mg | ORAL_TABLET | Freq: Four times a day (QID) | ORAL | Status: DC | PRN
  Administered 2023-12-22 – 2023-12-25 (×10): 650 mg via ORAL
  Filled 2023-12-22 (×10): qty 2

## 2023-12-22 MED ORDER — APIXABAN 5 MG PO TABS
5.0000 mg | ORAL_TABLET | Freq: Two times a day (BID) | ORAL | Status: DC
  Administered 2023-12-22 – 2023-12-26 (×8): 5 mg via ORAL
  Filled 2023-12-22 (×7): qty 1
  Filled 2023-12-22: qty 2

## 2023-12-22 MED ORDER — PREDNISONE 20 MG PO TABS
40.0000 mg | ORAL_TABLET | Freq: Once | ORAL | Status: AC
  Administered 2023-12-22: 40 mg via ORAL
  Filled 2023-12-22: qty 2

## 2023-12-22 NOTE — Progress Notes (Signed)
 A consult was placed to IV Therapy for new IV access; pt remains on IV amiodarone ; her  Right posterior forearm is noted to have phlebitis up to and extending past her R elbow; states "it's a little sore;"  pt c/o "itching" on anterior aspect of R forearm and has a pink area;  Pt also asked this writer to look at an area near the knuckle below her left thumb; she stated "I think that was from the ER but they couldn't use the iv there;"  There is a dime-sized , scaly, blackened area where she thinks an IV was started; a large bruise is noted below that, but no drainage from the site;  RN at bedside and made aware;

## 2023-12-22 NOTE — Progress Notes (Signed)
 Progress Note   Patient: Michelle Wilcox JYN:[id number] DOB: 1946/08/13 DOA: 12/20/2023     1 DOS: the patient was seen and examined on 12/22/2023   Brief hospital course: 78 y.o. female with history of polymyalgia rheumatica and autoimmune hemolytic anemia status post splenectomy on prednisone  was admitted for A-fib with RVR in the month of March 2025 after patient had a cortisone shot to her knee presents to the ER with complaints of shortness of breath and palpitations.  During the admission in March 2025 plan was to cardiovert but 2D echo showed left atrial appendage thrombus so DCCV was deferred at that time.  Patient had TEE done yesterday which showed EF of 65% and no further thrombus and patient had cardioversion yesterday.  Patient states she did well after the cardioversion and today while shopping in [place] started having palpitation and shortness of breath which progressively got worse towards the evening and decided to come to the ER.  Denies any chest pain fever chills or productive cough.   ED Course: In ER patient was in A-fib with RVR hypoxic requiring BiPAP.  Chest x-ray shows pleural effusion and congestion concerning for fluid overload.  Labs show WBC count of 18.8 BNP of 193 troponin 28.  ER physician discussed with on-call cardiologist and cardiology requested patient being started on amiodarone  infusion and to continue the digoxin .  Lasix  40 mg IV was given.  Assessment and Plan: A-fib with RVR  Cardiology following. Currently on IV amio with continued toprol  50mg   Per Cardiology, consider EP eval as outpatient for ablation When seen this AM, noted to be rate controlled on IV amio Follow lytes and correct as needed Continued on therapeutic anticoag Acute on chronic HFrEF  RecentTEE done yesterday showed EF of 60 to 65%.   Continue with IV lasix  40mg  daily per Cardiology Hypertension  BP stable and controlled at this time Cont betablocker  Polymyalgia rheumatica  and history of autoimmune hemolytic anemia on prednisone . Complaining of marked B foot pains. B foot xrays with finding of DJD. Strongly doubt gout, however uric acid was ordered per pt request. Normal as expected Given extra 40mg  prednisone  x 1 Cont analgesia as needed Consulted PT Hyperlipidemia Cont on statin Leukocytosis  Hemodynamically stable  On prednisone  Recheck cbc in AM Elevated trop Per Cardiology, will need eval for CAD. Likely outpt PET/CT Cont ASA/BB      Subjective: complaining of B foot pains  Physical Exam: Vitals:   12/22/23 0709 12/22/23 1118 12/22/23 1353 12/22/23 1628  BP: 128/66  (!) 118/56 (!) 118/59  Pulse: (!) 57 71 65   Resp: 17  18   Temp: 97.7 F (36.5 C)  98.3 F (36.8 C) 98.3 F (36.8 C)  TempSrc: Oral  Oral Oral  SpO2: 94%  94% 94%  Weight:      Height:       General exam: Conversant, in no acute distress Respiratory system: normal chest rise, clear, no audible wheezing Cardiovascular system: regular rhythm, s1-s2 Gastrointestinal system: Nondistended, nontender, pos BS Central nervous system: No seizures, no tremors Extremities: No cyanosis, no joint deformities Skin: No rashes, no pallor Psychiatry: Affect normal // no auditory hallucinations   Data Reviewed:  Labs reviewed: Na 141, K 3.9, Cr 0.94, WBC 10.1, Hgb 13.0  Family Communication: Pt in room, family not at bedside  Disposition: Status is: Inpatient Continue inpt stay because: severity of illness  Planned Discharge Destination: Home    Author: Cherylle Corwin, MD 12/22/2023 7:17 PM  For on call review [email or web address].

## 2023-12-22 NOTE — Progress Notes (Signed)
  Echocardiogram 2D Echocardiogram has been performed.  Michelle Wilcox 12/22/2023, 5:50 PM

## 2023-12-22 NOTE — Progress Notes (Signed)
 Cardiologist:  [name]   Subjective:  Denies SSCP, palpitations or Dyspnea Has severe pain in feet as with previous admission   Objective:  Vitals:   12/21/23 1813 12/21/23 2018 12/22/23 0443 12/22/23 0709  BP:  (!) 123/53 106/61 128/66  Pulse: 71  (!) 58 (!) 57  Resp:  19 16 17   Temp:  98.1 F (36.7 C) 98.1 F (36.7 C) 97.7 F (36.5 C)  TempSrc:  Oral Oral Oral  SpO2: 92%   94%  Weight:      Height:        Intake/Output from previous day:  Intake/Output Summary (Last 24 hours) at 12/22/2023 1015 Last data filed at 12/22/2023 0806 Gross per 24 hour  Intake 1082.73 ml  Output 1050 ml  Net 32.73 ml    Physical Exam:  Overweight female Basilar rales Apical MR murmur Abdomen benign Trace edema Mild erythema over [facility] pad sites from 12/19/23   Lab Results: Basic Metabolic Panel: Recent Labs    12/21/23 0025 12/21/23 0434 12/22/23 0326  NA  --  135 141  K  --  3.4* 3.9  CL  --  99 103  CO2  --  24 28  GLUCOSE  --  129* 100*  BUN  --  9 11  CREATININE  --  0.88 0.94  CALCIUM   --  7.9* 8.1*  MG 2.3  --  2.1   Liver Function Tests: Recent Labs    12/20/23 2128 12/22/23 0326  AST 46* 21  ALT 18 12  ALKPHOS 63 37*  BILITOT 0.9 0.8  PROT 6.5 5.4*  ALBUMIN 4.1 3.2*   No results for input(s): "LIPASE", "AMYLASE" in the last 72 hours. CBC: Recent Labs    12/20/23 2128 12/20/23 2137 12/21/23 0434 12/22/23 0326  WBC 18.8*  --  14.1* 10.1  NEUTROABS 13.7*  --   --   --   HGB 14.3   < > 11.9* 13.0  HCT 44.8   < > 36.5 39.2  MCV 100.2*  --  98.4 98.0  PLT 354  --  290 288   < > = values in this interval not displayed.    Thyroid  Function Tests: Recent Labs    12/21/23 0026  TSH 1.851   Anemia Panel: No results for input(s): "VITAMINB12", "FOLATE", "FERRITIN", "TIBC", "IRON", "RETICCTPCT" in the last 72 hours.  Imaging: DG Chest Port 1 View Result Date: 12/20/2023 CLINICAL DATA:  Shortness of breath EXAM: PORTABLE CHEST 1 VIEW COMPARISON:   11/04/2023 FINDINGS: Cardiac enlargement with pulmonary vascular congestion and perihilar/basilar infiltrates likely edema. This is progressing since prior study. Possible small pleural effusions. No pneumothorax. Mediastinal contours appear intact. Degenerative changes in the spine and shoulders. IMPRESSION: Cardiac enlargement with pulmonary vascular congestion and progressing edema since prior study. Probable small pleural effusions. Electronically Signed   By: Boyce Byes M.D.   On: 12/20/2023 21:45    Cardiac Studies:  ECG: SR rate 85 PAC    Telemetry:  NSR   Echo:   Medications:    cholecalciferol   1,000 Units Oral QODAY   digoxin   0.125 mg Oral Daily   enoxaparin  (LOVENOX ) injection  80 mg Subcutaneous BID   folic acid   1 mg Oral Daily   furosemide   40 mg Intravenous BID   gabapentin   200 mg Oral BID   lactase  9,000 Units Oral TID WC   metoprolol  succinate  50 mg Oral BID   predniSONE   5 mg Oral Q breakfast  rosuvastatin   5 mg Oral QODAY      amiodarone  30 mg/hr (12/22/23 0409)    Assessment/Plan:  PAF: continue eliquis  Load with iv amiodarone  Continue Toprol  50 mg Recent [facility] 12/19/23 She has severe bi atrial enlargement and valvular dx so maintaining NSR may be difficult. Consider EP evaluation as outpatient for ablation Transition to oral amiodarone  in am  Valve DX:  she has functional mod/severe MR and moderate TR from severe bi atrial enlargement Likely contributes to CHF and arrhythmia Moderate AR as well. TTE this weekend now that she is in NSR  Elevated Troponin: no chest pain or acute ECG changes ? Related to [facility]. Will need evaluation for CAD. Given recent [facility] would not want to hold DOAC for cath. Likely outpatient [facility] PET/CT pending troponin trend  Continue ASA/beta blocker TTE to look for RWMA;s as EF was normal on TEE 12/19/23 CHF:  related to arrhythmia, valve dx lasix  40 mg iv daily. Try to maintain NSR. TTE update related to valvular dx likely as opposed to low  EF  Foot pain:  no edema, good pedal pulses ? Related to gout, neuropathy PMR she is only on low dose prednisone  Per primary service consider checking plain xrays, uric acid Continue gabapentin  also consider Voltaren  gel or capsaicin cream to feet  Janelle Mediate 12/22/2023, 10:15 AM

## 2023-12-23 DIAGNOSIS — I4819 Other persistent atrial fibrillation: Secondary | ICD-10-CM | POA: Diagnosis not present

## 2023-12-23 DIAGNOSIS — I5033 Acute on chronic diastolic (congestive) heart failure: Secondary | ICD-10-CM

## 2023-12-23 DIAGNOSIS — I4891 Unspecified atrial fibrillation: Secondary | ICD-10-CM | POA: Diagnosis not present

## 2023-12-23 DIAGNOSIS — I509 Heart failure, unspecified: Secondary | ICD-10-CM | POA: Diagnosis not present

## 2023-12-23 LAB — CBC
HCT: 40.4 % (ref 36.0–46.0)
Hemoglobin: 13.3 g/dL (ref 12.0–15.0)
MCH: 31.7 pg (ref 26.0–34.0)
MCHC: 32.9 g/dL (ref 30.0–36.0)
MCV: 96.2 fL (ref 80.0–100.0)
Platelets: 330 10*3/uL (ref 150–400)
RBC: 4.2 MIL/uL (ref 3.87–5.11)
RDW: 15.2 % (ref 11.5–15.5)
WBC: 12.2 10*3/uL — ABNORMAL HIGH (ref 4.0–10.5)
nRBC: 0 % (ref 0.0–0.2)

## 2023-12-23 LAB — COMPREHENSIVE METABOLIC PANEL WITH GFR
ALT: 11 U/L (ref 0–44)
AST: 21 U/L (ref 15–41)
Albumin: 3 g/dL — ABNORMAL LOW (ref 3.5–5.0)
Alkaline Phosphatase: 40 U/L (ref 38–126)
Anion gap: 9 (ref 5–15)
BUN: 11 mg/dL (ref 8–23)
CO2: 30 mmol/L (ref 22–32)
Calcium: 8.6 mg/dL — ABNORMAL LOW (ref 8.9–10.3)
Chloride: 97 mmol/L — ABNORMAL LOW (ref 98–111)
Creatinine, Ser: 0.81 mg/dL (ref 0.44–1.00)
GFR, Estimated: >60 mL/min (ref >60)
Glucose, Bld: 110 mg/dL — ABNORMAL HIGH (ref 70–99)
Potassium: 3.6 mmol/L (ref 3.5–5.1)
Sodium: 136 mmol/L (ref 135–145)
Total Bilirubin: 0.6 mg/dL (ref 0.0–1.2)
Total Protein: 5.4 g/dL — ABNORMAL LOW (ref 6.5–8.1)

## 2023-12-23 MED ORDER — POTASSIUM CHLORIDE CRYS ER 20 MEQ PO TBCR
40.0000 meq | EXTENDED_RELEASE_TABLET | Freq: Once | ORAL | Status: AC
  Administered 2023-12-23: 40 meq via ORAL
  Filled 2023-12-23: qty 2

## 2023-12-23 MED ORDER — SPIRONOLACTONE 12.5 MG HALF TABLET
12.5000 mg | ORAL_TABLET | Freq: Every day | ORAL | Status: DC
  Administered 2023-12-23 – 2023-12-25 (×3): 12.5 mg via ORAL
  Filled 2023-12-23 (×4): qty 1

## 2023-12-23 MED ORDER — AMIODARONE HCL 200 MG PO TABS
200.0000 mg | ORAL_TABLET | Freq: Every day | ORAL | Status: DC
  Administered 2023-12-23 – 2023-12-26 (×4): 200 mg via ORAL
  Filled 2023-12-23 (×4): qty 1

## 2023-12-23 NOTE — Progress Notes (Signed)
 Progress Note   Patient: Michelle Wilcox WUJ:[id number] DOB: July 07, 1946 DOA: 12/20/2023     2 DOS: the patient was seen and examined on 12/23/2023   Brief hospital course: 78 y.o. female with history of polymyalgia rheumatica and autoimmune hemolytic anemia status post splenectomy on prednisone  was admitted for A-fib with RVR in the month of March 2025 after patient had a cortisone shot to her knee presents to the ER with complaints of shortness of breath and palpitations.  During the admission in March 2025 plan was to cardiovert but 2D echo showed left atrial appendage thrombus so DCCV was deferred at that time.  Patient had TEE done yesterday which showed EF of 65% and no further thrombus and patient had cardioversion yesterday.  Patient states she did well after the cardioversion and today while shopping in [name] started having palpitation and shortness of breath which progressively got worse towards the evening and decided to come to the ER.  Denies any chest pain fever chills or productive cough.   ED Course: In ER patient was in A-fib with RVR hypoxic requiring BiPAP.  Chest x-ray shows pleural effusion and congestion concerning for fluid overload.  Labs show WBC count of 18.8 BNP of 193 troponin 28.  ER physician discussed with on-call cardiologist and cardiology requested patient being started on amiodarone  infusion and to continue the digoxin .  Lasix  40 mg IV was given.  Assessment and Plan: A-fib with RVR  Cardiology and EP following  EP recs to continue amiodarone , ok for PO, cont toprol  50mg , cont digoxin  EP recs that rate control may be best in this situation given pt is not ideal candidate for ablation given chronic prednisone /autoimmune disease Per Cardiology, consider EP eval as outpatient for ablation Acute on chronic HFrEF  RecentTEE done yesterday showed EF of 60 to 65%.   Continue with IV lasix  40mg  daily per Cardiology Hypertension  BP stable and controlled at this  time Cont betablocker  Polymyalgia rheumatica and history of autoimmune hemolytic anemia on prednisone . Complaining of marked B foot pains. B foot xrays with finding of DJD. Strongly doubt gout, however uric acid was ordered per pt request. Normal as expected Cont daily prednisone  Cont analgesia as needed Consulted PT, recs for outpt PT Hyperlipidemia Cont on statin Leukocytosis  Hemodynamically stable  On prednisone  Recheck cbc in AM Elevated trop Per Cardiology, will need eval for CAD. Likely outpt PET/CT Cont ASA/BB      Subjective: Voiding very well. Without complaints  Physical Exam: Vitals:   12/23/23 0030 12/23/23 0628 12/23/23 0751 12/23/23 1627  BP: 128/80  118/67 125/78  Pulse: 89  72 85  Resp: 17 16 16 16   Temp: 99 F (37.2 C) 97.9 F (36.6 C) 98.7 F (37.1 C) 98 F (36.7 C)  TempSrc: Oral Oral Oral Oral  SpO2:   95% 95%  Weight:      Height:       General exam: Conversant, in no acute distress Respiratory system: normal chest rise, clear, no audible wheezing Cardiovascular system: regular rhythm, s1-s2 Gastrointestinal system: Nondistended, nontender, pos BS Central nervous system: No seizures, no tremors Extremities: No cyanosis, no joint deformities Skin: No rashes, no pallor Psychiatry: Affect normal // no auditory hallucinations   Data Reviewed: Labs reviewed: Na 136, K 3.6, Cr 0.81, WBC 12.2  Family Communication: Pt in room, family not at bedside  Disposition: Status is: Inpatient Continue inpt stay because: severity of illness  Planned Discharge Destination: Home    Author: Mara Seminole  Joan Mouton, MD 12/23/2023 5:28 PM  For on call review [email or web address].

## 2023-12-23 NOTE — Plan of Care (Signed)

## 2023-12-23 NOTE — Progress Notes (Addendum)
 Patient Name: Michelle Wilcox Date of Encounter: 12/23/2023  [facility] Cardiologist: Randene Bustard, MD   Interval Summary  .    Patient reports feeling better this AM. Breathing has not yet returned to baseline and she continues to require supplemental oxygen. Denies chest pain, palpitations   Vital Signs .    Vitals:   12/22/23 2212 12/23/23 0030 12/23/23 0628 12/23/23 0751  BP: 125/62 128/80  118/67  Pulse: 99 89  72  Resp:  17 16 16   Temp:  99 F (37.2 C) 97.9 F (36.6 C) 98.7 F (37.1 C)  TempSrc:  Oral Oral Oral  SpO2:    95%  Weight:      Height:        Intake/Output Summary (Last 24 hours) at 12/23/2023 0821 Last data filed at 12/23/2023 1610 Gross per 24 hour  Intake 240 ml  Output 600 ml  Net -360 ml      12/21/2023    1:11 PM 12/20/2023    9:24 PM 12/12/2023    8:52 AM  Last 3 Weights  Weight (lbs) 184 lb 1.6 oz 182 lb 15.7 oz 184 lb  Weight (kg) 83.507 kg 83 kg 83.462 kg      Telemetry/ECG    Atrial fibrillation, HR in the 70s-80s - Personally Reviewed  Physical Exam .   GEN: No acute distress. Sitting upright in the bed  Neck: No JVD Cardiac: Irregular rate and rhythm, no murmurs, rubs, or gallops.  Respiratory: Crackles in bilateral lung bases. Normal WOB on [place]  GI: Soft, nontender, non-distended  MS: No edema in BLE   Assessment & Plan .     Persistent atrial fibrillation  - Patient was first diagnosed with afib in 10/2023. Started on amiodarone , digoxin , metoprolol   - Previously underwent TEE guided DCCV on 5/1. TEE noted severe biatrial enlargement, moderate-severe MR, moderate TR  - Presented 5/2 with shortness of breath. Found to be in afib with RVR and had evidence of hypervolemia  - Loaded with IV amiodarone . Per telemetry, patient remains in atrial fibrillation with HR in the 80s. She does feel better with better HR control  - With severe biatrial enlargement and valvular disease, it may be difficult for her to maintain NSR   - Transition from IV amiodarone  to PO amiodarone  200 mg daily today. TSH normal on 5/3. LFTs normal this admission  - Dig level 0.9 in 11/2023. Ordered repeat dig level this admission. Continue oral dig for now, however I am not sure she is getting much benefit  - Continue metoprolol  sucinate 50 mg BID  - Consider repeat DCCV. May need EP referral - Continue eliquis  5 mg BID  Valve Disease  - TEE 5/1 noted moderate-severe mitral valve regurgitation (functional MR), moderate TR, moderate AI  - Surface echo this admission showed trivial MR, mild-moderate AI   Acute on Chronic Diastolic Heart Failure - Echocardiogram this admission showed EF 60-65%, no regional wall motion abnormalities, normal RV systolic function, moderately dilated LA, mild-moderate AI  - BP elevated to 193.3. CXR showed pulmonary vascular congestion, progressive edema, probable small pleural effusions  - Now on IV lasix  40 mg BID- output at least 0.6 L urine yesterday (I/Os incomplete). Creatinine stable at 0.81 - Continues to have evidence of hypervolemia on exam (crackles in lung bases). Also is still requiring supplemental oxygen. Continue IV lasix  today  - Start spironolactone 12.5 mg daily   Elevated Troponin  - hstn 28>544 - No chest pain or  acute EKG changes. Likely demand ischemia from afib, hypervolemia  - Echo this admission with EF 60-65%, no wall motion abnormalities  - Anticipate outpatient PET  Otherwise per primary  - Polymyalgia rheumatica  - Leukocytosis   For questions or updates, please contact [facility] Please consult [email or web address] for contact info under        Signed, Debria Fang, PA-C   I have personally seen and examined this patient. I agree with the assessment and plan as outlined above.  She is admitted with volume overload in setting of rapid atiral fib. She has been diuresed and remains volume overloaded. Agree with continuing IV Lasix  today.  She is now in rate  controlled atrial fib. Converting to po amiodarone  today. Continue Eliquis . Will ask EP to see her today Elevated troponin likely due to demand ischemia. I agree that we should not stop Eliquis  to perform a cardiac cath. Will plan outpatient ischemic evaluation with PET CT.   Antoinette Batman, MD, [facility] 12/23/2023 10:22 AM

## 2023-12-23 NOTE — Progress Notes (Signed)
 Mobility Specialist Progress Note;   12/23/23 1334  Mobility  Activity Ambulated with assistance to bathroom;Ambulated with assistance in room  Level of Assistance Contact guard assist, steadying assist  Assistive Device Four wheel walker  Distance Ambulated (ft) 15 ft  Activity Response Tolerated well  Mobility Referral Yes  Mobility visit 1 Mobility  Mobility Specialist Start Time (ACUTE ONLY) 1334  Mobility Specialist Stop Time (ACUTE ONLY) 1346  Mobility Specialist Time Calculation (min) (ACUTE ONLY) 12 min   Pt agreeable to mobility. Required MinG assistance for safety. Requested assistance to BR at BOS. Void successful. VSS throughout. MD entered room, requesting to talk w/ pt. Pt returned back to bed, will ambulate later on.   Janit Meline Mobility Specialist Please contact via SecureChat or [employer] [phone]

## 2023-12-23 NOTE — Progress Notes (Signed)
 Mobility Specialist Progress Note;   12/23/23 1410  Mobility  Activity Ambulated with assistance in hallway  Level of Assistance Standby assist, set-up cues, supervision of patient - no hands on  Assistive Device Four wheel walker  Distance Ambulated (ft) 300 ft  Activity Response Tolerated well  Mobility Referral Yes  Mobility visit 1 Mobility  Mobility Specialist Start Time (ACUTE ONLY) 1410  Mobility Specialist Stop Time (ACUTE ONLY) 1424  Mobility Specialist Time Calculation (min) (ACUTE ONLY) 14 min   Pt agreeable to mobility. Required no physical assistance during ambulation, SV. HR up to 98 bpm w/ activity. No c/o when asked. Pt returned back to bed with all needs met, alarm on.   Janit Meline Mobility Specialist Please contact via SecureChat or [employer] [phone]

## 2023-12-23 NOTE — Consult Note (Addendum)
 ELECTROPHYSIOLOGY CONSULT NOTE    Patient ID: Michelle Wilcox MRN: [id number], DOB/AGE: [birth year] 78 y.o.  Admit date: 12/20/2023 Date of Consult: 12/23/2023  Primary Physician: [name], MD Primary Cardiologist: Randene Bustard, MD  Electrophysiologist: Dr. Daneil Dunker   Referring Provider: Dr. Abel Hoe  Patient Profile: Michelle Wilcox is a 78 y.o. female with a history of AF, HTN, HLD, PMR, Giant Cell Arteritis, autoimmune hemolytic anemia s/p splenectomy on prednisone , asthma, anxiety, who is being seen today for the evaluation of atrial fibrillation at the request of Dr. Abel Hoe.  HPI:  Michelle Wilcox is a 78 y.o. female who presented to [facility] ER on 5/2 with reports of palpitations and progressive shortness of breath.  Patient was admitted in 10/2023 for [facility] after a cortisone shot in her knee.  During that admission, she was planned for cardioversion but TEE showed left atrial appendage thrombus.  She was started on amiodarone , eliquis , metoprolol  and digoxin .   The patient presented on 12/20/23 for TEE with DCCV.  LVEF was 65% and no further thrombus noted, LA/RA severely dilated.  She cardioverted with shock x1 at 200j. Patient felt well postprocedure and went to [facility] shopping when she suddenly began to have palpitations and progressive shortness of breath.  She went home and did not recover prompting ER visit.  In the emergency room she was noted to be in A-fib with RVR.  She was hypoxemic requiring BiPAP.  CXR showed cardiomegaly, vascular congestion and small bilateral pleural effusions.  She was started on amiodarone , treated with IV Lasix  and digoxin .  She denies chest pain, palpitations, dyspnea, PND, orthopnea, nausea, vomiting, dizziness, syncope, edema, weight gain, or early satiety.   Labs Potassium3.6 (05/05 0645) Magnesium   2.1 (05/04 0326) Creatinine, ser  0.81 (05/05 0645) PLT  330 (05/05 0645) HGB  13.3 (05/05 0645) WBC 12.2* (05/05  0645) Troponin I (High Sensitivity)544* (05/03 0025).    Past Medical History:  Diagnosis Date   Anxiety    hx of   Asthma    at times   CHF (congestive heart failure) (HCC)    Depression    hx of   Dyspnea    Giant cell arteritis (HCC)    Gouty arthropathy    Hemolytic anemia (HCC)    Hyperlipidemia    Hypertension    Irregular heart beat    extra beat   Migraines    Polymyalgia (HCC)    Polymyalgia rheumatica (HCC)    Temporal arteritis (HCC)      Surgical History:  Past Surgical History:  Procedure Laterality Date   BARTHOLIN GLAND CYST EXCISION     non ca   BREAST BIOPSY Right    CARDIOVERSION N/A 11/05/2023   Procedure: CARDIOVERSION;  Surgeon: Jerryl Morin, DO;  Location: [facility] INVASIVE CV LAB;  Service: Cardiovascular;  Laterality: N/A;   CARDIOVERSION N/A 12/19/2023   Procedure: CARDIOVERSION;  Surgeon: Jann Melody, MD;  Location: [facility] INVASIVE CV LAB;  Service: Cardiovascular;  Laterality: N/A;   EYE MUSCLE SURGERY     as a child 35 yrs old   KNEE SURGERY  2004   rt knee   LAPAROSCOPIC SPLENECTOMY N/A 06/18/2018   Procedure: LAPAROSCOPIC SPLENECTOMY ERAS PATHWAY;  Surgeon: Ayesha Lente, MD;  Location: [facility] ORS;  Service: General;  Laterality: N/A;   toncil     TONSILLECTOMY     as a child   TRANSESOPHAGEAL ECHOCARDIOGRAM (CATH LAB) N/A 11/05/2023   Procedure: TRANSESOPHAGEAL ECHOCARDIOGRAM;  Surgeon: Jerryl Morin, DO;  Location: [facility] INVASIVE CV LAB;  Service: Cardiovascular;  Laterality: N/A;   TRANSESOPHAGEAL ECHOCARDIOGRAM (CATH LAB) N/A 12/19/2023   Procedure: TRANSESOPHAGEAL ECHOCARDIOGRAM;  Surgeon: Jann Melody, MD;  Location: [facility] INVASIVE CV LAB;  Service: Cardiovascular;  Laterality: N/A;     Medications Prior to Admission  Medication Sig Dispense Refill Last Dose/Taking   acetaminophen  (TYLENOL ) 650 MG CR tablet Take 1,300 mg by mouth 2 (two) times daily.   12/20/2023 Morning   ALPRAZolam  (XANAX ) 0.25 MG tablet Take 0.25 mg by mouth 2  (two) times daily as needed for anxiety.   Unknown   amiodarone  (PACERONE ) 200 MG tablet Take 2 tablets (400 mg total) by mouth 2 (two) times daily for 3 days, THEN 1 tablet (200 mg total) 2 (two) times daily for 14 days, THEN 1 tablet (200 mg total) daily. (Patient taking differently:  1 tablet (200 mg total) daily.) 90 tablet 0 12/20/2023 Morning   apixaban  (ELIQUIS ) 5 MG TABS tablet Take 1 tablet (5 mg total) by mouth 2 (two) times daily. 90 tablet 3 12/20/2023 at  8:45 AM   cholecalciferol  (VITAMIN D3) 25 MCG (1000 UNIT) tablet Take 1,000 Units by mouth at bedtime. Take one capsule by mouth at bedtime every other day.   Past Week   clindamycin (CLEOCIN) 150 MG capsule Take 600 mg by mouth See admin instructions. Take prior to Dental Procedures   Past Week   diclofenac  Sodium (VOLTAREN ) 1 % GEL Apply 4 g topically as needed (Hands, knees, feet).   Past Week   digoxin  (LANOXIN ) 0.125 MG tablet Take 1 tablet (0.125 mg total) by mouth daily. 90 tablet 3 12/20/2023 Morning   diphenhydrAMINE  (BENADRYL ) 25 MG tablet Take 25-50 mg by mouth daily as needed for allergies (allergic reactions).   Unknown   folic acid  (FOLVITE ) 1 MG tablet TAKE 1 TABLET(1 MG) BY MOUTH DAILY 90 tablet 3 12/20/2023 Morning   gabapentin  (NEURONTIN ) 100 MG capsule Take 200 mg by mouth 2 (two) times daily.   12/20/2023 Morning   lactase (LACTAID) 3000 units tablet Take 9,000-15,000 Units by mouth as needed (consuming dairy products).   12/20/2023 Morning   loratadine  (CLARITIN ) 10 MG tablet Take 10 mg by mouth daily.   12/20/2023 Morning   metoprolol  succinate (TOPROL -XL) 50 MG 24 hr tablet Take 1 tablet (50 mg total) by mouth 2 (two) times daily. Take with or immediately following a meal. 90 tablet 3 12/20/2023 Morning   Multiple Vitamin (MULTIVITAMIN) tablet Take 1 tablet by mouth at bedtime. One-A-Day Women's Vitamin   Past Week   predniSONE  (DELTASONE ) 5 MG tablet Take 1 tablet (5 mg total) by mouth daily with breakfast. 30 tablet 7 12/20/2023  Morning   Probiotic Product (ALIGN) 4 MG CAPS Take 4 mg by mouth every morning.   12/20/2023 Morning   rosuvastatin  (CRESTOR ) 5 MG tablet Take 5 mg by mouth at bedtime. Take one tablet by mouth at bedtime every other day.   12/20/2023 Morning   VENTOLIN  HFA 108 (90 Base) MCG/ACT inhaler Take 1-2 puffs by mouth every 4 (four) hours as needed for shortness of breath.  0 12/20/2023 Evening    Inpatient Medications:   amiodarone   200 mg Oral Daily   apixaban   5 mg Oral BID   cholecalciferol   1,000 Units Oral QODAY   digoxin   0.125 mg Oral Daily   docusate sodium   100 mg Oral BID   folic acid   1 mg Oral Daily   furosemide   40 mg Intravenous BID  gabapentin   300 mg Oral BID   lactase  9,000 Units Oral TID WC   metoprolol  succinate  50 mg Oral BID   predniSONE   5 mg Oral Q breakfast   rosuvastatin   5 mg Oral QODAY   spironolactone  12.5 mg Oral Daily    Allergies:  Allergies  Allergen Reactions   Penicillins Anaphylaxis   Almond (Diagnostic) Hives   Ceftin [Cefuroxime Axetil] Hives and Other (See Comments)    headaches   Ciprofloxacin Hives and Other (See Comments)    headaches   Darvocet [Propoxyphene N-Acetaminophen ] Other (See Comments)    headaches   Lactose Intolerance (Gi) Diarrhea   Levaquin [Levofloxacin In D5w] Hives   Sulfa Antibiotics Hives   Sulfamethoxazole-Trimethoprim Hives   Zyrtec [Cetirizine Hcl] Other (See Comments)    headache   Other Other (See Comments)    Powder in gloves - Rash  MSG - Hives  Swiss Cheese - Hives       Family History  Adopted: Yes  Problem Relation Age of Onset   Arthritis Mother    Stroke Mother    Other Mother        Splenectomy due to unknown reason.     Physical Exam: Vitals:   12/22/23 2212 12/23/23 0030 12/23/23 0628 12/23/23 0751  BP: 125/62 128/80  118/67  Pulse: 99 89  72  Resp:  17 16 16   Temp:  99 F (37.2 C) 97.9 F (36.6 C) 98.7 F (37.1 C)  TempSrc:  Oral Oral Oral  SpO2:    95%  Weight:      Height:         GEN- chronically ill appearing adult female lying in bed in NAD, A&O x 3, normal affect HEENT: Normocephalic, atraumatic Lungs- CTAB, Normal effort.  Heart- Irregularly irregular rate and rhythm, No M/G/R.  GI- Soft, NT, ND.  Extremities- No clubbing, cyanosis, or edema   Radiology/Studies: ECHOCARDIOGRAM COMPLETE Result Date: 12/22/2023    ECHOCARDIOGRAM REPORT   Patient Name:   RAMELLE SCHMECHEL Date of Exam: 12/22/2023 Medical Rec #:  [id number]               Height:       63.0 in Accession #:    [id number]              Weight:       184.1 lb Date of Birth:  08-27-1945               BSA:          1.867 m Patient Age:    77 years                BP:           118/56 mmHg Patient Gender: F                       HR:           71 bpm. Exam Location:  Inpatient Procedure: 2D Echo (Both Spectral and Color Flow Doppler were utilized during            procedure). Indications:    acute diastolic chf  History:        Patient has prior history of Echocardiogram examinations, most                 recent 12/19/2023. Risk Factors:Hypertension and Dyslipidemia.  Sonographer:    Dione Franks RDCS Referring Phys: [phone] PETER C  NISHAN IMPRESSIONS  1. Left ventricular ejection fraction, by estimation, is 60 to 65%. The left ventricle has normal function. The left ventricle has no regional wall motion abnormalities. Left ventricular diastolic parameters are indeterminate.  2. Right ventricular systolic function is normal. The right ventricular size is normal. Tricuspid regurgitation signal is inadequate for assessing PA pressure.  3. Left atrial size was moderately dilated.  4. The mitral valve is normal in structure. Trivial mitral valve regurgitation. No evidence of mitral stenosis.  5. The aortic valve is tricuspid. Aortic valve regurgitation is mild to moderate. No aortic stenosis is present.  6. The inferior vena cava is dilated in size with >50% respiratory variability, suggesting right atrial pressure of 8  mmHg. FINDINGS  Left Ventricle: Left ventricular ejection fraction, by estimation, is 60 to 65%. The left ventricle has normal function. The left ventricle has no regional wall motion abnormalities. The left ventricular internal cavity size was normal in size. There is  no left ventricular hypertrophy. Left ventricular diastolic parameters are indeterminate. Right Ventricle: The right ventricular size is normal. Right vetricular wall thickness was not well visualized. Right ventricular systolic function is normal. Tricuspid regurgitation signal is inadequate for assessing PA pressure. Left Atrium: Left atrial size was moderately dilated. Right Atrium: Right atrial size was normal in size. Pericardium: There is no evidence of pericardial effusion. Mitral Valve: The mitral valve is normal in structure. Trivial mitral valve regurgitation. No evidence of mitral valve stenosis. Tricuspid Valve: The tricuspid valve is normal in structure. Tricuspid valve regurgitation is trivial. No evidence of tricuspid stenosis. Aortic Valve: The aortic valve is tricuspid. Aortic valve regurgitation is mild to moderate. Aortic regurgitation PHT measures 385 msec. No aortic stenosis is present. Aortic valve mean gradient measures 4.4 mmHg. Aortic valve peak gradient measures 11.0  mmHg. Aortic valve area, by VTI measures 2.17 cm. Pulmonic Valve: The pulmonic valve was not well visualized. Pulmonic valve regurgitation is not visualized. No evidence of pulmonic stenosis. Aorta: The aortic root and ascending aorta are structurally normal, with no evidence of dilitation. Venous: The inferior vena cava is dilated in size with greater than 50% respiratory variability, suggesting right atrial pressure of 8 mmHg. IAS/Shunts: The interatrial septum was not well visualized.  LEFT VENTRICLE PLAX 2D LVIDd:         5.20 cm   Diastology LVIDs:         3.50 cm   LV e' medial:    6.85 cm/s LV PW:         0.70 cm   LV E/e' medial:  11.8 LV IVS:         0.90 cm   LV e' lateral:   8.92 cm/s LVOT diam:     1.80 cm   LV E/e' lateral: 9.0 LV SV:         56 LV SV Index:   30 LVOT Area:     2.54 cm  RIGHT VENTRICLE          IVC RV Basal diam:  3.00 cm  IVC diam: 2.50 cm LEFT ATRIUM           Index        RIGHT ATRIUM           Index LA diam:      4.60 cm 2.46 cm/m   RA Area:     16.40 cm LA Vol (A2C): 87.6 ml 46.93 ml/m  RA Volume:   40.70 ml  21.80 ml/m LA Vol (  A4C): 87.3 ml 46.77 ml/m  AORTIC VALVE AV Area (Vmax):    1.75 cm AV Area (Vmean):   2.00 cm AV Area (VTI):     2.17 cm AV Vmax:           165.60 cm/s AV Vmean:          93.719 cm/s AV VTI:            0.261 m AV Peak Grad:      11.0 mmHg AV Mean Grad:      4.4 mmHg LVOT Vmax:         114.00 cm/s LVOT Vmean:        73.700 cm/s LVOT VTI:          0.222 m LVOT/AV VTI ratio: 0.85 AI PHT:            385 msec  AORTA Ao Root diam: 2.90 cm Ao Asc diam:  3.30 cm MITRAL VALVE MV Area (PHT): 3.60 cm    SHUNTS MV Decel Time: 211 msec    Systemic VTI:  0.22 m MV E velocity: 80.50 cm/s  Systemic Diam: 1.80 cm MV A velocity: 56.80 cm/s MV E/A ratio:  1.42 Armida Lander MD Electronically signed by Armida Lander MD Signature Date/Time: 12/22/2023/6:04:31 PM    Final    DG Foot 2 Views Left Result Date: 12/22/2023 CLINICAL DATA:  Left foot pain. EXAM: RIGHT FOOT - 2 VIEW; LEFT FOOT - 2 VIEW COMPARISON:  Right foot radiographs 11/13/2021. FINDINGS: Right foot: The bones appear demineralized. No evidence of acute fracture or dislocation. There are moderate degenerative changes at the 1st metatarsophalangeal joint. Mild hallux valgus deformity and mild hammertoe deformities of the 2nd and 3rd digits. No acute soft tissue abnormalities are identified. Artifact from the patient's socks. Left foot: No evidence of acute fracture or dislocation. There are moderate midfoot and 1st MTP joint degenerative changes. No focal soft tissue abnormalities are identified. IMPRESSION: No acute osseous findings in either foot. Moderate  degenerative changes at the 1st MTP joints bilaterally. Electronically Signed   By: Elmon Hagedorn M.D.   On: 12/22/2023 13:28   DG Foot 2 Views Right Result Date: 12/22/2023 CLINICAL DATA:  Left foot pain. EXAM: RIGHT FOOT - 2 VIEW; LEFT FOOT - 2 VIEW COMPARISON:  Right foot radiographs 11/13/2021. FINDINGS: Right foot: The bones appear demineralized. No evidence of acute fracture or dislocation. There are moderate degenerative changes at the 1st metatarsophalangeal joint. Mild hallux valgus deformity and mild hammertoe deformities of the 2nd and 3rd digits. No acute soft tissue abnormalities are identified. Artifact from the patient's socks. Left foot: No evidence of acute fracture or dislocation. There are moderate midfoot and 1st MTP joint degenerative changes. No focal soft tissue abnormalities are identified. IMPRESSION: No acute osseous findings in either foot. Moderate degenerative changes at the 1st MTP joints bilaterally. Electronically Signed   By: Elmon Hagedorn M.D.   On: 12/22/2023 13:28   DG Chest Port 1 View Result Date: 12/20/2023 CLINICAL DATA:  Shortness of breath EXAM: PORTABLE CHEST 1 VIEW COMPARISON:  11/04/2023 FINDINGS: Cardiac enlargement with pulmonary vascular congestion and perihilar/basilar infiltrates likely edema. This is progressing since prior study. Possible small pleural effusions. No pneumothorax. Mediastinal contours appear intact. Degenerative changes in the spine and shoulders. IMPRESSION: Cardiac enlargement with pulmonary vascular congestion and progressing edema since prior study. Probable small pleural effusions. Electronically Signed   By: Boyce Byes M.D.   On: 12/20/2023 21:45   ECHO TEE Result  Date: 12/19/2023    TRANSESOPHOGEAL ECHO REPORT   Patient Name:   Michelle Wilcox Date of Exam: 12/19/2023 Medical Rec #:  [id number]               Height:       63.0 in Accession #:    [id number]              Weight:       184.0 lb Date of Birth:  March 18, 1946                BSA:          1.866 m Patient Age:    77 years                BP:           156/89 mmHg Patient Gender: F                       HR:           102 bpm. Exam Location:  Inpatient Procedure: Transesophageal Echo, 3D Echo, Color Doppler, Cardiac Doppler and            Intracardiac Opacification Agent (Both Spectral and Color Flow            Doppler were utilized during procedure). Indications:     I48.91* Unspecified atrial fibrillation  History:         Patient has prior history of Echocardiogram examinations, most                  recent 11/05/2023. Arrythmias:Atrial Fibrillation; Risk                  Factors:Hypertension and Dyslipidemia.  Sonographer:     Sherline Distel Senior RDCS Referring Phys:  KATLYN D WEST Diagnosing Phys: Gloriann Larger MD PROCEDURE: After discussion of the risks and benefits of a TEE, an informed consent was obtained from the patient. The transesophogeal probe was passed without difficulty through the esophogus of the patient. Sedation performed by different physician. The patient was monitored while under deep sedation. Anesthestetic sedation was provided intravenously by Anesthesiology: 160mg  of Propofol . The patient developed no complications during the procedure. A successful direct current cardioversion was performed at 200 joules with 1 attempt.  IMPRESSIONS  1. Left ventricular ejection fraction, by estimation, is 60 to 65%. Left ventricular ejection fraction by 3D volume is 65 %. The left ventricle has normal function.  2. Right ventricular systolic function is normal. The right ventricular size is mildly enlarged.  3. Left atrial size was severely dilated. No left atrial/left atrial appendage thrombus was detected.  4. Right atrial size was severely dilated.  5. Central jet of atrial, functional MR with blunted pulmonary vein signal. The mitral valve is normal in structure. Moderate to severe mitral valve regurgitation. No evidence of mitral stenosis.  6. Atrial functional  tricuspid regurgitation- two jets. Tricuspid valve regurgitation is moderate.  7. There is a larger central jet and a smaller, commissural jet between the non and left cusp; central jet 2DVC: 0.3 cm. The aortic valve is tricuspid. Aortic valve regurgitation is moderate. No aortic stenosis is present.  8. 3D performed of the LAA and demonstrates Patent LAA- Chicken wing morphology. 23 mm maximal diameter.  9. Cannot exclude a small PFO. FINDINGS  Left Ventricle: Left ventricular ejection fraction, by estimation, is 60 to 65%. Left ventricular ejection fraction by 3D volume is 65 %. The left  ventricle has normal function. Definity  contrast agent was given IV to delineate the left ventricular endocardial borders. The left ventricular internal cavity size was normal in size. Right Ventricle: The right ventricular size is mildly enlarged. No increase in right ventricular wall thickness. Right ventricular systolic function is normal. Left Atrium: Left atrial size was severely dilated. No left atrial/left atrial appendage thrombus was detected. Right Atrium: Right atrial size was severely dilated. Pericardium: There is no evidence of pericardial effusion. Mitral Valve: Central jet of atrial, functional MR with blunted pulmonary vein signal. The mitral valve is normal in structure. Moderate to severe mitral valve regurgitation. No evidence of mitral valve stenosis. Tricuspid Valve: Atrial functional tricuspid regurgitation- two jets. The tricuspid valve is normal in structure. Tricuspid valve regurgitation is moderate . No evidence of tricuspid stenosis. Aortic Valve: There is a larger central jet and a smaller, commissural jet between the non and left cusp; central jet 2DVC: 0.3 cm. The aortic valve is tricuspid. Aortic valve regurgitation is moderate. No aortic stenosis is present. Pulmonic Valve: The pulmonic valve was normal in structure. Pulmonic valve regurgitation is trivial. No evidence of pulmonic stenosis. Aorta:  The aortic root and ascending aorta are structurally normal, with no evidence of dilitation. IAS/Shunts: There is right bowing of the interatrial septum, suggestive of elevated left atrial pressure. Cannot exclude a small PFO. Additional Comments: 3D was performed not requiring image post processing on an independent workstation and was normal. Spectral Doppler performed.  3D Volume EF LV 3D EF:    Left ventricular ejection fraction by 3D volume is 65              %. LV 3D EDV:   47.36 ml LV 3D ESV:   16.50 ml  3D Volume EF: 3D EF:        65 % Gloriann Larger MD Electronically signed by Gloriann Larger MD Signature Date/Time: 12/19/2023/12:39:33 PM    Final    EP STUDY Result Date: 12/19/2023 See surgical note for result.   EKG:12/21/23 NSR 85 bpm with PAC's (personally reviewed)  TELEMETRY: AF 90's, when in NSR 60-80's (personally reviewed)   Assessment/Plan:  Persistent Atrial Fibrillation   High Risk Drug Monitoring: Amiodarone  Initial dx of AF in 10/2023, s/p TEE with DCCV on 12/19/23 with ERAF, severely dilated R/L atrium, moderate-severe MR / moderate TR  -continue amiodarone , ok for PO as has technically had PO load prior to admit -continue Toprol  50 mg daily  -continue digoxin  for now, follow up digoxin  level  -rate control strategy may ultimately end up being the best in this situation given patient not ideal candidate for ablation given chronic prednisone  / auto immune disease  Secondary Hypercoagulable State  Hx Falls  Prior hx of fall with facial laceration in 01/2023 -continue Eliquis  5mg  BID, dose reviewed and appropriate by age / wt       For questions or updates, please contact [facility] Please consult [email or web address] for contact info under Cardiology/STEMI.  Signed, Creighton Doffing, NP-C, AGACNP-BC [facility] - Electrophysiology  12/23/2023, 12:14 PM  I have seen, examined the patient, and reviewed the above assessment and plan.    General: Well  developed, in no acute distress.  Neck: No JVD.  Cardiac: Normal rate, irregular rhythm.  Resp: Normal work of breathing.  Ext: No edema.  Neuro: No gross focal deficits.  Psych: Normal affect.   Assessment: Michelle Wilcox is a 78 y.o. female with a history of AF, HTN, HLD, PMR, Giant  Cell Arteritis, autoimmune hemolytic anemia s/p splenectomy on prednisone , asthma, anxiety who is being seen today for the evaluation of atrial fibrillation at the request of Dr. Abel Hoe.  Patient has relatively recently new diagnosis of atrial fibrillation.  I suspect she has had paroxysmal atrial fibrillation for some time given her severely dilated left atrium and recurrence of AF within 24 hours after cardioversion.  She underwent cardioversion on 5/1 after having been on amiodarone  for at least 1 month.  She went back into atrial fibrillation 24 hours later after strenuous trip to [place].  EKG, however, on 5/3 shows sinus rhythm with frequent PACs.  Review of telemetry also shows periods of sinus rhythm with PACs.  It appears she is now paroxysmal on amiodarone , which suggests some benefit to the medication.  Changing to dofetilide would require amiodarone  washout with no guarantee of improved success.  All other antiarrhythmic medications are unlikely to be more successful than amiodarone  at maintaining sinus rhythm.  Unfortunately, she is not an ideal candidate for catheter ablation given her severely dilated left atrium and longstanding steroid use.   Plan:  - Continue amiodarone  200 mg once daily. - Continue Toprol  50 mg once daily. - Continue digoxin  and check digoxin  level. - Suggest 2-week ZIO monitor on discharge to assess AF burden.  If she is in AF the overwhelming majority of the time, then risk of amiodarone  long-term may exceed benefits and transition to a pure rate control strategy may be necessary as outpatient. - Continue Eliquis  5 mg twice daily for now.  Patient's longstanding steroid  use also makes her high risk for Watchman device implant.  Risks and benefits of anticoagulation were discussed.  Ardeen Kohler, MD 12/23/2023 11:10 PM

## 2023-12-23 NOTE — Evaluation (Signed)
 Physical Therapy Evaluation Patient Details Name: Michelle Wilcox MRN: [id number] DOB: November 16, 1945 Today's Date: 12/23/2023  History of Present Illness  Patient is a 78 y/o female admitted 12/20/23 with SOB and palpitations one day post TEE/DCCV for a-fib.  She had admission 3/17 due to a-fib though could not undergo cardioversion at that time due to L atrial appendage thrombus found.  PMH positive for CHF, PMR, temporal arteritis, autoimmune hemolytic anemia s/p splenectomy, HTN.  Clinical Impression  Patient presents with decreased mobility due to generalized weakness and deconditioning.  Currently S level for mobility with rollator in the hall and able to walk a good distance, though continues to be limited by foot pain (reports some better today, though hoping to get another 40mg  dose of prednisone ).  Ambulation today on RA with SpO2 maintained above 90%.  She will benefit from skilled PT in the acute setting, though hopes to return to [facility]-outpatient PT at [facility] at d/c.       If plan is discharge home, recommend the following: A little help with walking and/or transfers;Assist for transportation;Help with stairs or ramp for entrance   Can travel by private vehicle        Equipment Recommendations None recommended by PT  Recommendations for Other Services       Functional Status Assessment Patient has had a recent decline in their functional status and demonstrates the ability to make significant improvements in function in a reasonable and predictable amount of time.     Precautions / Restrictions Precautions Precautions: Fall      Mobility  Bed Mobility Overal bed mobility: Modified Independent                  Transfers Overall transfer level: Needs assistance Equipment used: Rolling walker (2 wheels) Transfers: Sit to/from Stand Sit to Stand: Supervision           General transfer comment: increased time and UE reliant     Ambulation/Gait Ambulation/Gait assistance: Supervision Gait Distance (Feet): 160 Feet Assistive device: Rollator (4 wheels) Gait Pattern/deviations: Step-through pattern, Decreased stride length, Trunk flexed       General Gait Details: slow pace, steady with rollator; on RA throughout SpO2 90% or greater (on 1.5L at rest)  Stairs            Wheelchair Mobility     Tilt Bed    Modified Rankin (Stroke Patients Only)       Balance Overall balance assessment: Needs assistance Sitting-balance support: Feet supported Sitting balance-Leahy Scale: Good Sitting balance - Comments: donned shoes at EOB   Standing balance support: Bilateral upper extremity supported Standing balance-Leahy Scale: Fair Standing balance comment: initial standing wihtout RW                             Pertinent Vitals/Pain Pain Assessment Pain Assessment: Faces Faces Pain Scale: Hurts little more Pain Location: feet Pain Descriptors / Indicators: Aching, Discomfort Pain Intervention(s): Monitored during session    Home Living Family/patient expects to be discharged to:: Private residence Living Arrangements: Spouse/significant other Available Help at Discharge: Family Type of Home: House Home Access: Level entry     Alternate Level Stairs-Number of Steps: sleeps in recliner downstairs, but goes up to take a shower Home Layout: Two level;Able to live on main level with bedroom/bathroom Home Equipment: Grab bars - tub/shower;Shower seat;BSC/3in1;Rolling Environmental consultant (2 wheels);Rollator (4 wheels) Additional Comments: helps husband with medication management and cooking. Sleeps  in recliner due to SOB    Prior Function Prior Level of Function : Independent/Modified Independent             Mobility Comments: has had one fall in past 6 months on R knee       Extremity/Trunk Assessment   Upper Extremity Assessment Upper Extremity Assessment: Overall WFL for tasks  assessed    Lower Extremity Assessment Lower Extremity Assessment: Generalized weakness (B foot pain)    Cervical / Trunk Assessment Cervical / Trunk Assessment: Normal  Communication   Communication Communication: No apparent difficulties    Cognition Arousal: Alert Behavior During Therapy: WFL for tasks assessed/performed   PT - Cognitive impairments: No apparent impairments                         Following commands: Intact       Cueing Cueing Techniques: Verbal cues     General Comments General comments (skin integrity, edema, etc.): ice applied to tops and bottoms of feet end of session; educated briefly on energy conservation strategies (i.e using shower chair, using equipment to help with limiting bending over (states has reacher) and for sitting to do daily tasks she can, using rollator, etc)    Exercises     Assessment/Plan    PT Assessment Patient needs continued PT services  PT Problem List Decreased activity tolerance;Decreased balance;Decreased mobility;Decreased strength;Pain       PT Treatment Interventions DME instruction;Functional mobility training;Therapeutic activities;Therapeutic exercise;Balance training;Neuromuscular re-education;Patient/family education;Gait training    PT Goals (Current goals can be found in the Care Plan section)  Acute Rehab PT Goals Patient Stated Goal: return to outpatient rehab PT Goal Formulation: With patient Time For Goal Achievement: 01/06/24 Potential to Achieve Goals: Good    Frequency Min 2X/week     Co-evaluation               AM-PAC PT "6 Clicks" Mobility  Outcome Measure Help needed turning from your back to your side while in a flat bed without using bedrails?: None Help needed moving from lying on your back to sitting on the side of a flat bed without using bedrails?: None Help needed moving to and from a bed to a chair (including a wheelchair)?: None Help needed standing up from a  chair using your arms (e.g., wheelchair or bedside chair)?: A Little Help needed to walk in hospital room?: A Little Help needed climbing 3-5 steps with a railing? : A Little 6 Click Score: 21    End of Session Equipment Utilized During Treatment: Gait belt Activity Tolerance: Patient tolerated treatment well Patient left: in bed;with call bell/phone within reach   PT Visit Diagnosis: Muscle weakness (generalized) (M62.81);Other abnormalities of gait and mobility (R26.89);Pain Pain - Right/Left:  (both) Pain - part of body: Ankle and joints of foot    Time: 1015-1055 PT Time Calculation (min) (ACUTE ONLY): 40 min   Charges:   PT Evaluation $PT Eval Moderate Complexity: 1 Mod PT Treatments $Gait Training: 8-22 mins $Self Care/Home Management: 8-22 PT General Charges $$ ACUTE PT VISIT: 1 Visit         Abigail Hoff, PT Acute Rehabilitation Services Office:[phone] 12/23/2023   [name] 12/23/2023, 12:25 PM

## 2023-12-23 NOTE — Care Management Important Message (Signed)
 Important Message  Patient Details  Name: Michelle Wilcox MRN: [id number] Date of Birth: Jan 25, 1946   Important Message Given:  Yes - Medicare IM     Janith Melnick 12/23/2023, 10:55 AM

## 2023-12-23 NOTE — TOC Initial Note (Signed)
 Transition of Care [facility]) - Initial/Assessment Note    Patient Details  Name: Michelle Wilcox MRN: [id number] Date of Birth: [birth year]  Transition of Care [facility]) CM/SW Contact:    Cosimo Diones, RN Phone Number: 12/23/2023, 12:03 PM  Clinical Narrative:  Patient presented for shortness of breath and palpitations. PTA patient reports that she is from home with spouse in which she is the caregiver of  spouse who has stage 4 cancer. Patient has DME cane and rolling walker. Patient was previously receiving outpatient PT at the [facility] location. Ambulatory referral submitted to this location for continued outpatient PT. Office to call the patient to schedule a visit. Case Manager will continue to follow for transition of care needs.                           Expected Discharge Plan: Home/Self Care (Outpatient PT [facility].) Barriers to Discharge: Continued Medical Work up   Patient Goals and CMS Choice Patient states their goals for this hospitalization and ongoing recovery are:: Plan is to return home.   Choice offered to / list presented to : NA     Expected Discharge Plan and Services In-house Referral: NA Discharge Planning Services: CM Consult   Living arrangements for the past 2 months: Single Family Home  HH Arranged: NA    Prior Living Arrangements/Services Living arrangements for the past 2 months: Single Family Home Lives with:: Spouse Patient language and need for interpreter reviewed:: Yes Do you feel safe going back to the place where you live?: Yes      Need for Family Participation in Patient Care: Yes (Comment) Care giver support system in place?: Yes (comment) Current home services: DME (rolling walker) Criminal Activity/Legal Involvement Pertinent to Current Situation/Hospitalization: No - Comment as needed  Activities of Daily Living   ADL Screening (condition at time of admission) Independently performs ADLs?: Yes  (appropriate for developmental age) Is the patient deaf or have difficulty hearing?: No Does the patient have difficulty seeing, even when wearing glasses/contacts?: No Does the patient have difficulty concentrating, remembering, or making decisions?: No  Permission Sought/Granted Permission sought to share information with : Facility Medical sales representative, Case Automotive engineer granted to share info w AGENCY: [facility]    Emotional Assessment Appearance:: Appears stated age Attitude/Demeanor/Rapport: Engaged Affect (typically observed): Appropriate Orientation: : Oriented to Self, Oriented to Place, Oriented to  Time, Oriented to Situation Alcohol / Substance Use: Not Applicable Psych Involvement: No (comment)  Admission diagnosis:  Atrial fibrillation with rapid ventricular response (HCC) [I48.91] Atrial fibrillation with RVR (HCC) [I48.91] Acute on chronic congestive heart failure, unspecified heart failure type (HCC) [I50.9] Atrial fibrillation, rapid (HCC) [I48.91] Patient Active Problem List   Diagnosis Date Noted   Acute on chronic HFrEF (heart failure with reduced ejection fraction) (HCC) 12/21/2023   Atrial fibrillation with rapid ventricular response (HCC) 12/21/2023   Atrial fibrillation, rapid (HCC) 12/21/2023   Pericardial effusion 11/07/2023   Atrial fibrillation with RVR (HCC) 11/05/2023   Borderline abnormal TFTs 11/05/2023   Macrocytosis 11/05/2023   Atrial fibrillation (HCC) 11/04/2023   PMR (polymyalgia rheumatica) (HCC) 11/04/2023   Primary localized osteoarthrosis of multiple sites 06/15/2022   Diastolic dysfunction without heart failure 03/05/2018   DOE (dyspnea on exertion) 03/05/2018   Preop cardiovascular exam 03/05/2018   Mild reactive airways disease 08/04/2017   Volume overload 08/03/2017   Hypokalemia 08/03/2017  Essential hypertension 08/03/2017   Hyperlipidemia 08/03/2017   Autoimmune hemolytic anemia (HCC)  11/20/2016   Hypersensitivity reaction 11/20/2016   Drusen (degenerative) of retina, bilateral 05/22/2016   Nuclear cataract 05/22/2016   Retinal hemorrhage of left eye 05/22/2016   Temporal arteritis (HCC) 05/22/2016   Hemolytic anemia (HCC) 03/19/2016   Breast mass, right 07/01/2012   PCP:  [name], MD Pharmacy:   [facility] [phone] - [place], [place] - [address] AT [facility] [address] [facility] [address] [place] [address] Phone: [phone] Fax: [phone]  [id number] [place],  - [address] AT [facility] OF [facility] [address] [facility] [address] [place] [address] Phone: [phone] Fax: [phone]  [facility] [address], [place] - [address] [place] [address] Phone: [phone] Fax: [phone]  [facility] [address] [place] [address] Phone: [phone] Fax: [phone]  Social Drivers of Health (SDOH) Social History: SDOH Screenings   Food Insecurity: No Food Insecurity (12/21/2023)  Housing: Low Risk  (12/21/2023)  Transportation Needs: No Transportation Needs (12/21/2023)  Utilities: Not At Risk (12/21/2023)  Social Connections: Socially Isolated (12/21/2023)  Tobacco Use: Low Risk  (12/21/2023)    Readmission Risk Interventions     No data to display

## 2023-12-24 ENCOUNTER — Ambulatory Visit

## 2023-12-24 ENCOUNTER — Inpatient Hospital Stay (HOSPITAL_COMMUNITY)

## 2023-12-24 DIAGNOSIS — I4891 Unspecified atrial fibrillation: Secondary | ICD-10-CM | POA: Diagnosis not present

## 2023-12-24 DIAGNOSIS — I428 Other cardiomyopathies: Secondary | ICD-10-CM

## 2023-12-24 DIAGNOSIS — I5033 Acute on chronic diastolic (congestive) heart failure: Secondary | ICD-10-CM | POA: Diagnosis not present

## 2023-12-24 DIAGNOSIS — I509 Heart failure, unspecified: Secondary | ICD-10-CM

## 2023-12-24 LAB — COMPREHENSIVE METABOLIC PANEL WITH GFR
ALT: 11 U/L (ref 0–44)
AST: 19 U/L (ref 15–41)
Albumin: 3.1 g/dL — ABNORMAL LOW (ref 3.5–5.0)
Alkaline Phosphatase: 42 U/L (ref 38–126)
Anion gap: 8 (ref 5–15)
BUN: 15 mg/dL (ref 8–23)
CO2: 33 mmol/L — ABNORMAL HIGH (ref 22–32)
Calcium: 8.6 mg/dL — ABNORMAL LOW (ref 8.9–10.3)
Chloride: 99 mmol/L (ref 98–111)
Creatinine, Ser: 0.85 mg/dL (ref 0.44–1.00)
GFR, Estimated: >60 mL/min (ref >60)
Glucose, Bld: 89 mg/dL (ref 70–99)
Potassium: 3.6 mmol/L (ref 3.5–5.1)
Sodium: 140 mmol/L (ref 135–145)
Total Bilirubin: 0.7 mg/dL (ref 0.0–1.2)
Total Protein: 5.8 g/dL — ABNORMAL LOW (ref 6.5–8.1)

## 2023-12-24 LAB — CBC
HCT: 42.7 % (ref 36.0–46.0)
Hemoglobin: 14.2 g/dL (ref 12.0–15.0)
MCH: 32.3 pg (ref 26.0–34.0)
MCHC: 33.3 g/dL (ref 30.0–36.0)
MCV: 97 fL (ref 80.0–100.0)
Platelets: 354 10*3/uL (ref 150–400)
RBC: 4.4 MIL/uL (ref 3.87–5.11)
RDW: 15.5 % (ref 11.5–15.5)
WBC: 8.4 10*3/uL (ref 4.0–10.5)
nRBC: 0 % (ref 0.0–0.2)

## 2023-12-24 LAB — DIGOXIN LEVEL: Digoxin Level: 0.7 ng/mL — ABNORMAL LOW (ref 0.8–2.0)

## 2023-12-24 MED ORDER — POTASSIUM CHLORIDE CRYS ER 20 MEQ PO TBCR
40.0000 meq | EXTENDED_RELEASE_TABLET | Freq: Once | ORAL | Status: AC
  Administered 2023-12-24: 40 meq via ORAL
  Filled 2023-12-24: qty 2

## 2023-12-24 MED ORDER — ORAL CARE MOUTH RINSE: 15.0000 mL | OROMUCOSAL | Status: DC | PRN

## 2023-12-24 NOTE — Care Management Important Message (Signed)
 Important Message  Patient Details  Name: Michelle Wilcox MRN: [id number] Date of Birth: 1945-12-09   Important Message Given:  Yes - Medicare IM     Janith Melnick 12/24/2023, 10:42 AM

## 2023-12-24 NOTE — Progress Notes (Signed)
 Progress Note   Patient: Michelle Wilcox ZOX:[id number] DOB: Sep 08, 1945 DOA: 12/20/2023     3 DOS: the patient was seen and examined on 12/24/2023   Brief hospital course: 78 y.o. female with history of polymyalgia rheumatica and autoimmune hemolytic anemia status post splenectomy on prednisone  was admitted for A-fib with RVR in the month of March 2025 after patient had a cortisone shot to her knee presents to the ER with complaints of shortness of breath and palpitations.  During the admission in March 2025 plan was to cardiovert but 2D echo showed left atrial appendage thrombus so DCCV was deferred at that time.  Patient had TEE done yesterday which showed EF of 65% and no further thrombus and patient had cardioversion yesterday.  Patient states she did well after the cardioversion and today while shopping in [place] started having palpitation and shortness of breath which progressively got worse towards the evening and decided to come to the ER.  Denies any chest pain fever chills or productive cough.   ED Course: In ER patient was in A-fib with RVR hypoxic requiring BiPAP.  Chest x-ray shows pleural effusion and congestion concerning for fluid overload.  Labs show WBC count of 18.8 BNP of 193 troponin 28.  ER physician discussed with on-call cardiologist and cardiology requested patient being started on amiodarone  infusion and to continue the digoxin .  Lasix  40 mg IV was given.  Assessment and Plan: A-fib with RVR  Cardiology and EP following  EP recs to continue amiodarone , ok for PO, cont toprol  50mg , cont digoxin  EP recs that rate control may be best in this situation given pt is not ideal candidate for ablation given chronic prednisone /autoimmune disease EP plan for 2 week zio at d/c. If in AF majority of the time, riks of amiodarone  likely outweighs benefits and she should be transitioned to pure rate control strategy Acute on chronic HFrEF  RecentTEE done yesterday showed EF of 60 to  65%.   Pt had been continued on IV lasix  40mg  daily, plan to transition to PO tomorrow Hypertension  BP stable and controlled at this time Cont betablocker  Polymyalgia rheumatica and history of autoimmune hemolytic anemia on prednisone . Complaining of marked B foot pains. B foot xrays with finding of DJD. Strongly doubt gout, however uric acid was ordered per pt request. Normal as expected Cont daily prednisone  Cont analgesia as needed Consulted PT, recs for outpt PT L hand xray reviewed. Extensive arthritic changes noted Hyperlipidemia Cont on statin Leukocytosis  Hemodynamically stable  On prednisone  Recheck cbc in AM Elevated trop Per Cardiology, will need eval for CAD. Likely outpt PET/CT Cont ASA/BB      Subjective: Feeling slightly weaker today following IV diuresis  Physical Exam: Vitals:   12/24/23 0742 12/24/23 0811 12/24/23 1122 12/24/23 1620  BP: 109/68 102/60 118/70 (!) 116/52  Pulse: 64 82 84 63  Resp: 18  18 18   Temp: (!) 97.5 F (36.4 C)  97.7 F (36.5 C) 98.2 F (36.8 C)  TempSrc: Axillary  Oral Oral  SpO2: 94%  96% 98%  Weight:      Height:       General exam: Awake, laying in bed, in nad Respiratory system: Normal respiratory effort, no wheezing Cardiovascular system: regular rate, s1, s2 Gastrointestinal system: Soft, nondistended, positive BS Central nervous system: CN2-12 grossly intact, strength intact Extremities: Perfused, no clubbing Skin: Normal skin turgor, no notable skin lesions seen Psychiatry: Mood normal // no visual hallucinations   Data Reviewed: Labs  reviewed: Na 140, K 3.6, Cr 0.85, WBC 8.4, Hgb 14.2, Plts 354  Family Communication: Pt in room, family not at bedside  Disposition: Status is: Inpatient Continue inpt stay because: severity of illness  Planned Discharge Destination: Home    Author: Cherylle Corwin, MD 12/24/2023 5:23 PM  For on call review [email or web address].

## 2023-12-24 NOTE — Progress Notes (Signed)
 Physical Therapy Treatment Patient Details Name: Michelle Wilcox MRN: [id number] DOB: 1946-06-14 Today's Date: 12/24/2023   History of Present Illness Patient is a 78 y/o female admitted 12/20/23 with SOB and palpitations one day post TEE/DCCV for a-fib.  She had admission 3/17 due to a-fib though could not undergo cardioversion at that time due to L atrial appendage thrombus found.  PMH positive for CHF, PMR, temporal arteritis, autoimmune hemolytic anemia s/p splenectomy, HTN.    PT Comments  Patient able to practice steps this session though limited due to continued pain in feet and due to SOB with HR up to 120's needing seated rest (SpO2 91% on RA).  She feels she may need to sleep downstairs initially and sponge bathe due to difficulty on steps today.  Updated post-acute therapy recommendation to HHPT as likely would fatigue to get to outpatient PT.  Will continue to follow.     If plan is discharge home, recommend the following: A little help with walking and/or transfers;Assist for transportation;Help with stairs or ramp for entrance   Can travel by private vehicle        Equipment Recommendations  None recommended by PT    Recommendations for Other Services       Precautions / Restrictions Precautions Precautions: Fall Precaution/Restrictions Comments: watch HR     Mobility  Bed Mobility Overal bed mobility: Modified Independent                  Transfers   Equipment used: Rollator (4 wheels) Transfers: Sit to/from Stand Sit to Stand: Supervision           General transfer comment: increased time and UE reliant; practiced x 2 to rollator in hallway and in stairwell for rest and for obtaining cane    Ambulation/Gait Ambulation/Gait assistance: Supervision Gait Distance (Feet): 120 Feet Assistive device: Rollator (4 wheels) Gait Pattern/deviations: Step-through pattern, Decreased stride length, Antalgic       General Gait Details: mild antalgia  with pt reporting more foot pain today   Stairs Stairs: Yes Stairs assistance: Min assist Stair Management: One rail Right, With cane, Step to pattern, Forwards Number of Stairs: 5 General stair comments: leading with L to ascend R to descend, assist to stabilize cane as stepping down and for balance with pain more in R foot; HR up to 120's with stairs, seated rest on rollator in stairwell; on RA SpO2 91-95%   Wheelchair Mobility     Tilt Bed    Modified Rankin (Stroke Patients Only)       Balance Overall balance assessment: Needs assistance   Sitting balance-Leahy Scale: Good     Standing balance support: Bilateral upper extremity supported Standing balance-Leahy Scale: Fair                              Hotel manager: No apparent difficulties  Cognition Arousal: Alert Behavior During Therapy: WFL for tasks assessed/performed   PT - Cognitive impairments: No apparent impairments                         Following commands: Intact      Cueing Cueing Techniques: Verbal cues  Exercises      General Comments General comments (skin integrity, edema, etc.): spouse present in transport wheelchair in the room, ice applied to feet end of session, discussed sleeping downstairs initially as pt does not feel she can  negotiate steps initially      Pertinent Vitals/Pain Pain Assessment Pain Score: 6  Pain Location: feet R>L Pain Descriptors / Indicators: Aching, Discomfort Pain Intervention(s): Monitored during session, Ice applied    Home Living                          Prior Function            PT Goals (current goals can now be found in the care plan section) Progress towards PT goals: Progressing toward goals    Frequency    Min 2X/week      PT Plan      Co-evaluation              AM-PAC PT "6 Clicks" Mobility   Outcome Measure  Help needed turning from your back to your side  while in a flat bed without using bedrails?: None Help needed moving from lying on your back to sitting on the side of a flat bed without using bedrails?: None Help needed moving to and from a bed to a chair (including a wheelchair)?: A Little Help needed standing up from a chair using your arms (e.g., wheelchair or bedside chair)?: A Little Help needed to walk in hospital room?: A Little Help needed climbing 3-5 steps with a railing? : A Little 6 Click Score: 20    End of Session Equipment Utilized During Treatment: Gait belt Activity Tolerance: Patient tolerated treatment well Patient left: in bed;with call bell/phone within reach;with family/visitor present   PT Visit Diagnosis: Muscle weakness (generalized) (M62.81);Other abnormalities of gait and mobility (R26.89);Pain Pain - Right/Left: Right (and L) Pain - part of body: Ankle and joints of foot     Time: 6213-0865 PT Time Calculation (min) (ACUTE ONLY): 36 min  Charges:    $Gait Training: 23-37 mins PT General Charges $$ ACUTE PT VISIT: 1 Visit                     Michelle Wilcox, PT Acute Rehabilitation Services Office:[phone] 12/24/2023    Michelle Wilcox 12/24/2023, 5:05 PM

## 2023-12-24 NOTE — Progress Notes (Addendum)
 Patient Name: Michelle Wilcox Date of Encounter: 12/24/2023 [facility] Cardiologist: Randene Bustard, MD   Interval Summary  .    Patient reports feeling well this AM. No chest pain, palpitations, shortness of breath. Remains in afib per tele   Vital Signs .    Vitals:   12/24/23 0014 12/24/23 0442 12/24/23 0742 12/24/23 0811  BP: 91/61 (!) 90/50 109/68 102/60  Pulse: 71 70 64 82  Resp: 20 18 18    Temp: 98.4 F (36.9 C) 97.6 F (36.4 C) (!) 97.5 F (36.4 C)   TempSrc: Oral Oral Axillary   SpO2: 95% 94% 94%   Weight:      Height:        Intake/Output Summary (Last 24 hours) at 12/24/2023 0826 Last data filed at 12/24/2023 0019 Gross per 24 hour  Intake 720 ml  Output 1050 ml  Net -330 ml      12/21/2023    1:11 PM 12/20/2023    9:24 PM 12/12/2023    8:52 AM  Last 3 Weights  Weight (lbs) 184 lb 1.6 oz 182 lb 15.7 oz 184 lb  Weight (kg) 83.507 kg 83 kg 83.462 kg      Telemetry/ECG    Atrial fibrillation, HR in the 70s-90s - Personally Reviewed  Physical Exam .   GEN: No acute distress.  Sitting upright in the bed eating breakfast  Neck: No JVD Cardiac: irregular rate and rhythm, no murmurs, rubs, or gallops.  Respiratory: Crackles in bilateral lung bases. Normal WOB on 1 L via [place]  GI: Soft, nontender, non-distended  MS: No edema in BLE  Assessment & Plan .    Persistent atrial fibrillation  - Patient was first diagnosed with afib in 10/2023. Started on amiodarone , digoxin , metoprolol   - Previously underwent TEE guided DCCV on 5/1. TEE noted severe biatrial enlargement, moderate-severe MR, moderate TR  - Presented 5/2 with shortness of breath. Found to be in afib with RVR and had evidence of hypervolemia  - Loaded with IV amiodarone . Now on PO amiodarone  200 mg daily, digoxin  0.125 mg daily, metoprolol  succinate 50 mg BID  - Per telemetry, HR in the 70s-80s. Remains in afib  - With severe biatrial enlargement and valvular disease, it may be  difficult for her to maintain NSR  - Seen by EP yesterday- per their recommendations, plan for 2 week zio at DC. If Michelle Wilcox is in AF majority of the time, risk of amiodarone  likely outweighs benefits and Michelle Wilcox should be transitioned to a pure rate control strategy  - Continue amiodarone  200 mg daily. TSH, LFTs within normal limits this admission  - Continue digoxin  0.125 mg daily. Dig level 0.7 this AM  - Continue metoprolol  sucinate 50 mg BID  - Continue eliquis  5 mg BID   Valve Disease  - TEE 5/1 noted moderate-severe mitral valve regurgitation (functional MR), moderate TR, moderate AI  - Surface echo this admission showed trivial MR, mild-moderate AI    Acute on Chronic Diastolic Heart Failure - Echocardiogram this admission showed EF 60-65%, no regional wall motion abnormalities, normal RV systolic function, moderately dilated LA, mild-moderate AI  - BP elevated to 193.3. CXR showed pulmonary vascular congestion, progressive edema, probable small pleural effusions  - Now on IV lasix  40 mg BID- output at least 1.05 L urine yesterday. Creatinine stable - Appears very close to euvolemic on exam today. Received IV lasix  40 mg this AM. Stop IV lasix  and start 40 mg PO tomorrow  -  Continue spironolactone 12.5 mg daily    Elevated Troponin  - hstn 28>544 - No chest pain or acute EKG changes. Likely demand ischemia from afib, hypervolemia  - Echo this admission with EF 60-65%, no wall motion abnormalities  - Anticipate outpatient PET   Otherwise per primary  - Polymyalgia rheumatica  - Leukocytosis   For questions or updates, please contact  [facility] Please consult [email or web address] for contact info under        Signed, Debria Fang, PA-C   I have personally seen and examined this patient. I agree with the assessment and plan as outlined above.  Michelle Wilcox is feeling well overall.  No events My exam: NAD  CV: irreg irreg  Lungs: clear bilat  No LE edema Rate controlled atrial  fib on amio. Seen by EP team. Not felt to be a candidate for ablation. Will oral amiodarone  for now. 2 week Zio on discharge and if mostly AF, will stop amiodarone .  Continue Eliquis  Volume status is normal. IV Lasix  was given this am. Will stop IV Lasix  and resume po Lasix  tomorrow.  Probably ready for d/c home tomorrow.   Antoinette Batman, MD, [facility] 12/24/2023 10:11 AM

## 2023-12-24 NOTE — Plan of Care (Signed)
   Problem: Coping: Goal: Level of anxiety will decrease Outcome: Progressing

## 2023-12-25 ENCOUNTER — Inpatient Hospital Stay

## 2023-12-25 ENCOUNTER — Other Ambulatory Visit: Payer: Self-pay | Admitting: Cardiology

## 2023-12-25 ENCOUNTER — Other Ambulatory Visit: Payer: Self-pay

## 2023-12-25 ENCOUNTER — Other Ambulatory Visit (HOSPITAL_COMMUNITY): Payer: Self-pay

## 2023-12-25 DIAGNOSIS — I4891 Unspecified atrial fibrillation: Secondary | ICD-10-CM | POA: Diagnosis not present

## 2023-12-25 DIAGNOSIS — I5033 Acute on chronic diastolic (congestive) heart failure: Secondary | ICD-10-CM | POA: Diagnosis not present

## 2023-12-25 LAB — COMPREHENSIVE METABOLIC PANEL WITH GFR
ALT: 12 U/L (ref 0–44)
AST: 22 U/L (ref 15–41)
Albumin: 3 g/dL — ABNORMAL LOW (ref 3.5–5.0)
Alkaline Phosphatase: 44 U/L (ref 38–126)
Anion gap: 12 (ref 5–15)
BUN: 16 mg/dL (ref 8–23)
CO2: 31 mmol/L (ref 22–32)
Calcium: 9.2 mg/dL (ref 8.9–10.3)
Chloride: 99 mmol/L (ref 98–111)
Creatinine, Ser: 0.9 mg/dL (ref 0.44–1.00)
GFR, Estimated: >60 mL/min (ref >60)
Glucose, Bld: 95 mg/dL (ref 70–99)
Potassium: 4.3 mmol/L (ref 3.5–5.1)
Sodium: 142 mmol/L (ref 135–145)
Total Bilirubin: 0.7 mg/dL (ref 0.0–1.2)
Total Protein: 5.8 g/dL — ABNORMAL LOW (ref 6.5–8.1)

## 2023-12-25 LAB — VITAMIN B12: Vitamin B-12: 391 pg/mL (ref 180–914)

## 2023-12-25 LAB — CBC
HCT: 44.4 % (ref 36.0–46.0)
Hemoglobin: 14.6 g/dL (ref 12.0–15.0)
MCH: 32.2 pg (ref 26.0–34.0)
MCHC: 32.9 g/dL (ref 30.0–36.0)
MCV: 97.8 fL (ref 80.0–100.0)
Platelets: 369 10*3/uL (ref 150–400)
RBC: 4.54 MIL/uL (ref 3.87–5.11)
RDW: 15.3 % (ref 11.5–15.5)
WBC: 7.9 10*3/uL (ref 4.0–10.5)
nRBC: 0 % (ref 0.0–0.2)

## 2023-12-25 MED ORDER — FUROSEMIDE 40 MG PO TABS
40.0000 mg | ORAL_TABLET | Freq: Every day | ORAL | Status: DC
  Administered 2023-12-25: 40 mg via ORAL
  Filled 2023-12-25 (×2): qty 1

## 2023-12-25 MED ORDER — FUROSEMIDE 10 MG/ML IJ SOLN
40.0000 mg | Freq: Once | INTRAMUSCULAR | Status: AC
  Administered 2023-12-25: 40 mg via INTRAVENOUS
  Filled 2023-12-25: qty 4

## 2023-12-25 MED ORDER — AMIODARONE HCL 200 MG PO TABS: ORAL_TABLET | ORAL | Status: DC

## 2023-12-25 MED ORDER — SPIRONOLACTONE 25 MG PO TABS
12.5000 mg | ORAL_TABLET | Freq: Every day | ORAL | 1 refills | Status: DC
  Filled 2023-12-25: qty 30, 60d supply, fill #0

## 2023-12-25 MED ORDER — FUROSEMIDE 40 MG PO TABS
40.0000 mg | ORAL_TABLET | Freq: Every day | ORAL | 1 refills | Status: DC
  Filled 2023-12-25: qty 30, 30d supply, fill #0

## 2023-12-25 NOTE — Progress Notes (Unsigned)
 Enrolled for [employer] to mail a ZIO XT long term holter monitor to the patients address on file.   Dr. Herbie Baltimore to read.

## 2023-12-25 NOTE — Progress Notes (Signed)
 Ordering 14 day non-live Zio monitor for A. Fib. Dr. [name] to read

## 2023-12-25 NOTE — Discharge Summary (Addendum)
 Physician Discharge Summary  Michelle Wilcox WUJ:[id number] DOB: 1946/02/08 DOA: 12/20/2023  PCP: [name], MD  Admit date: 12/20/2023 Discharge date: 12/25/2023  Time spent:9minutes  Recommendations for Outpatient Follow-up:  14-day ZIO monitor for A-fib Cardiology, [facility] in 2 to 3 weeks   Discharge Diagnoses:  Principal Problem:   Atrial fibrillation with RVR (HCC) Active Problems:   Autoimmune hemolytic anemia (HCC)   Essential hypertension   PMR (polymyalgia rheumatica) (HCC)   Acute on chronic HFrEF (heart failure with reduced ejection fraction) (HCC)   Atrial fibrillation with rapid ventricular response (HCC)   Atrial fibrillation, rapid (HCC)   Acute on chronic congestive heart failure [facility])   Discharge Condition: Improved  Diet recommendation: Low-sodium, heart healthy  Filed Weights   12/20/23 2124 12/21/23 1311  Weight: 83 kg 83.5 kg    History of present illness:  78 y.o. female with history of polymyalgia rheumatica and autoimmune hemolytic anemia status post splenectomy on prednisone  was admitted for A-fib with RVR in the month of March 2025 after patient had a cortisone shot to her knee presents to the ER with complaints of shortness of breath and palpitations.  During the admission in March 2025 plan was to cardiovert but 2D echo showed left atrial appendage thrombus so DCCV was deferred at that time.  Patient had TEE done yesterday which showed EF of 65% and no further thrombus and patient had cardioversion yesterday.  Patient states she did well after the cardioversion and today while shopping in [place] started having palpitation and shortness of breath ED Course: In ER patient was in A-fib with RVR hypoxic requiring BiPAP.  Chest x-ray shows pleural effusion and congestion concerning for fluid overload.  Labs show WBC count of 18.8 BNP of 193 troponin 28.  ER physician discussed with on-call cardiologist and cardiology requested patient being  started on amiodarone  infusion and to continue the digoxin .  Hospital Course:    A-fib with RVR  -just cardioverted 5/2 prior to this admission - Seen by EP and cardiology in consultation, started on amiodarone  gtt. -Now transition to oral amiodarone  200 Mg daily, EP recommended Zio patch for 2 weeks at discharge if she is in A-fib majority of Than risk of amiodarone  outweighs benefits and should be transitioned to p.o. rate control strategy only -Continue Eliquis  -Follow-up with [facility]  Acute on chronic HFrEF  -Recent TEE showed EF of 60 to 65%.  - Followed by cards, diuresed with IV Lasix , transition to oral Lasix  and Aldactone   Polymyalgia rheumatica and history of autoimmune hemolytic anemia on prednisone . -Continue prednisone  5 Mg daily per home regimen  Hyperlipidemia -Cont on statin  Elevated trop - Troponin ranged from 28, 544, no chest pain no acute EKG changes, felt to be demand ischemia from A-fib RVR, echo showed normal EF and wall motion, cardiology recommended outpatient PET scan  Peripheral neuropathy - Continue home regimen of gabapentin , check B12 prior to discharge  Discharge Exam: Vitals:   12/25/23 0757 12/25/23 0820  BP: (!) 140/85 (!) 140/85  Pulse:  91  Resp: 19   Temp: 98.1 F (36.7 C)   SpO2:     Gen: Awake, Alert, Oriented X 3,  HEENT: no JVD Lungs: Good air movement bilaterally, CTAB CVS: S1S2/irregular Abd: soft, Non tender, non distended, BS present Extremities: No edema Skin: no new rashes on exposed skin   Discharge Instructions   Discharge Instructions     Ambulatory referral to Physical Therapy   Complete  by: As directed    Outpatient Physical Therapy evaluation and treatment resumption orders.   Diet - low sodium heart healthy   Complete by: As directed    Increase activity slowly   Complete by: As directed       Allergies as of 12/25/2023       Reactions   Penicillins Anaphylaxis   Almond (diagnostic) Hives    Ceftin [cefuroxime Axetil] Hives, Other (See Comments)   headaches   Ciprofloxacin Hives, Other (See Comments)   headaches   Darvocet [propoxyphene N-acetaminophen ] Other (See Comments)   headaches   Lactose Intolerance (gi) Diarrhea   Levaquin [levofloxacin In D5w] Hives   Sulfa Antibiotics Hives   Sulfamethoxazole-trimethoprim Hives   Zyrtec [cetirizine Hcl] Other (See Comments)   headache   Other Other (See Comments)   Powder in gloves - Rash MSG - Hives Swiss Cheese - Hives        Medication List     TAKE these medications    acetaminophen  650 MG CR tablet Commonly known as: TYLENOL  Take 1,300 mg by mouth 2 (two) times daily.   Align 4 MG Caps Take 4 mg by mouth every morning.   ALPRAZolam  0.25 MG tablet Commonly known as: XANAX  Take 0.25 mg by mouth 2 (two) times daily as needed for anxiety.   amiodarone  200 MG tablet Commonly known as: PACERONE  1 tablet (200 mg total) daily. What changed: See the new instructions.   apixaban  5 MG Tabs tablet Commonly known as: ELIQUIS  Take 1 tablet (5 mg total) by mouth 2 (two) times daily.   cholecalciferol  25 MCG (1000 UNIT) tablet Commonly known as: VITAMIN D3 Take 1,000 Units by mouth at bedtime. Take one capsule by mouth at bedtime every other day.   clindamycin 150 MG capsule Commonly known as: CLEOCIN Take 600 mg by mouth See admin instructions. Take prior to Dental Procedures   diclofenac  Sodium 1 % Gel Commonly known as: VOLTAREN  Apply 4 g topically as needed (Hands, knees, feet).   digoxin  0.125 MG tablet Commonly known as: LANOXIN  Take 1 tablet (0.125 mg total) by mouth daily.   diphenhydrAMINE  25 MG tablet Commonly known as: BENADRYL  Take 25-50 mg by mouth daily as needed for allergies (allergic reactions).   folic acid  1 MG tablet Commonly known as: FOLVITE  TAKE 1 TABLET(1 MG) BY MOUTH DAILY   furosemide  40 MG tablet Commonly known as: LASIX  Take 1 tablet (40 mg total) by mouth daily. Start  taking on: Dec 26, 2023   gabapentin  100 MG capsule Commonly known as: NEURONTIN  Take 200 mg by mouth 2 (two) times daily.   lactase 3000 units tablet Commonly known as: LACTAID Take 9,000-15,000 Units by mouth as needed (consuming dairy products).   loratadine  10 MG tablet Commonly known as: CLARITIN  Take 10 mg by mouth daily.   metoprolol  succinate 50 MG 24 hr tablet Commonly known as: TOPROL -XL Take 1 tablet (50 mg total) by mouth 2 (two) times daily. Take with or immediately following a meal.   multivitamin tablet Take 1 tablet by mouth at bedtime. One-A-Day Women's Vitamin   predniSONE  5 MG tablet Commonly known as: DELTASONE  Take 1 tablet (5 mg total) by mouth daily with breakfast.   rosuvastatin  5 MG tablet Commonly known as: CRESTOR  Take 5 mg by mouth at bedtime. Take one tablet by mouth at bedtime every other day.   spironolactone 25 MG tablet Commonly known as: ALDACTONE Take 0.5 tablets (12.5 mg total) by mouth daily. Start taking on:  Dec 26, 2023   Ventolin  HFA 108 (90 Base) MCG/ACT inhaler Generic drug: albuterol  Take 1-2 puffs by mouth every 4 (four) hours as needed for shortness of breath.       Allergies  Allergen Reactions   Penicillins Anaphylaxis   Almond (Diagnostic) Hives   Ceftin [Cefuroxime Axetil] Hives and Other (See Comments)    headaches   Ciprofloxacin Hives and Other (See Comments)    headaches   Darvocet [Propoxyphene N-Acetaminophen ] Other (See Comments)    headaches   Lactose Intolerance (Gi) Diarrhea   Levaquin [Levofloxacin In D5w] Hives   Sulfa Antibiotics Hives   Sulfamethoxazole-Trimethoprim Hives   Zyrtec [Cetirizine Hcl] Other (See Comments)    headache   Other Other (See Comments)    Powder in gloves - Rash  MSG - Hives  Swiss Cheese - Hives       Follow-up Information     [facility] Follow up.   Specialty: Rehabilitation Why: Office to call the patient with a visit time-if the  office has not called within 5 business days please reach out to the office. Contact information: [address] [place]  [address] [phone]                 The results of significant diagnostics from this hospitalization (including imaging, microbiology, ancillary and laboratory) are listed below for reference.    Significant Diagnostic Studies: DG Hand Complete Left Result Date: 12/24/2023 CLINICAL DATA:  Left hand pain.  RA scab below thumb. EXAM: LEFT HAND - COMPLETE 3+ VIEW COMPARISON:  Left wrist radiographs 02/09/2023 FINDINGS: There is again diffuse decreased bone mineralization. Severe thumb carpometacarpal joint space narrowing, subchondral sclerosis, subchondral concave cortical flattening/remodeling, and large peripheral osteophytes. Moderate to severe lateral thumb metacarpophalangeal and lateral thumb interphalangeal joint space narrowing, subchondral sclerosis, subchondral cystic change, and osteophytosis. Severe second through fifth DIP joint space narrowing, subchondral sclerosis, and peripheral osteophytosis, greatest within the third and fourth digits. Mild central erosions within the 2nd through fourth DIP joints. Moderate second through fifth PIP and third metacarpophalangeal joint space narrowing. Moderate to severe triscaphe joint space narrowing. No acute fracture or dislocation. IMPRESSION: 1. Severe thumb carpometacarpal osteoarthritis. 2. Moderate to severe lateral thumb metacarpophalangeal and lateral thumb interphalangeal osteoarthritis. 3. Severe second through fifth DIP osteoarthritis with mild central erosions within the 2nd through fourth DIP joints. This is consistent with erosive osteoarthritis. Electronically Signed   By: Bertina Broccoli M.D.   On: 12/24/2023 16:36   ECHOCARDIOGRAM COMPLETE Result Date: 12/22/2023    ECHOCARDIOGRAM REPORT   Patient Name:   Michelle Wilcox Date of Exam: 12/22/2023 Medical Rec #:   [id number]               Height:       63.0 in Accession #:    [id number]              Weight:       184.1 lb Date of Birth:  05/30/1946               BSA:          1.867 m Patient Age:    77 years                BP:           118/56 mmHg Patient Gender: F  HR:           71 bpm. Exam Location:  Inpatient Procedure: 2D Echo (Both Spectral and Color Flow Doppler were utilized during            procedure). Indications:    acute diastolic chf  History:        Patient has prior history of Echocardiogram examinations, most                 recent 12/19/2023. Risk Factors:Hypertension and Dyslipidemia.  Sonographer:    Michelle Wilcox RDCS Referring Phys: [id number] Michelle Wilcox IMPRESSIONS  1. Left ventricular ejection fraction, by estimation, is 60 to 65%. The left ventricle has normal function. The left ventricle has no regional wall motion abnormalities. Left ventricular diastolic parameters are indeterminate.  2. Right ventricular systolic function is normal. The right ventricular size is normal. Tricuspid regurgitation signal is inadequate for assessing PA pressure.  3. Left atrial size was moderately dilated.  4. The mitral valve is normal in structure. Trivial mitral valve regurgitation. No evidence of mitral stenosis.  5. The aortic valve is tricuspid. Aortic valve regurgitation is mild to moderate. No aortic stenosis is present.  6. The inferior vena cava is dilated in size with >50% respiratory variability, suggesting right atrial pressure of 8 mmHg. FINDINGS  Left Ventricle: Left ventricular ejection fraction, by estimation, is 60 to 65%. The left ventricle has normal function. The left ventricle has no regional wall motion abnormalities. The left ventricular internal cavity size was normal in size. There is  no left ventricular hypertrophy. Left ventricular diastolic parameters are indeterminate. Right Ventricle: The right ventricular size is normal. Right vetricular wall thickness was not well  visualized. Right ventricular systolic function is normal. Tricuspid regurgitation signal is inadequate for assessing PA pressure. Left Atrium: Left atrial size was moderately dilated. Right Atrium: Right atrial size was normal in size. Pericardium: There is no evidence of pericardial effusion. Mitral Valve: The mitral valve is normal in structure. Trivial mitral valve regurgitation. No evidence of mitral valve stenosis. Tricuspid Valve: The tricuspid valve is normal in structure. Tricuspid valve regurgitation is trivial. No evidence of tricuspid stenosis. Aortic Valve: The aortic valve is tricuspid. Aortic valve regurgitation is mild to moderate. Aortic regurgitation PHT measures 385 msec. No aortic stenosis is present. Aortic valve mean gradient measures 4.4 mmHg. Aortic valve peak gradient measures 11.0  mmHg. Aortic valve area, by VTI measures 2.17 cm. Pulmonic Valve: The pulmonic valve was not well visualized. Pulmonic valve regurgitation is not visualized. No evidence of pulmonic stenosis. Aorta: The aortic root and ascending aorta are structurally normal, with no evidence of dilitation. Venous: The inferior vena cava is dilated in size with greater than 50% respiratory variability, suggesting right atrial pressure of 8 mmHg. IAS/Shunts: The interatrial septum was not well visualized.  LEFT VENTRICLE PLAX 2D LVIDd:         5.20 cm   Diastology LVIDs:         3.50 cm   LV e' medial:    6.85 cm/s LV PW:         0.70 cm   LV E/e' medial:  11.8 LV IVS:        0.90 cm   LV e' lateral:   8.92 cm/s LVOT diam:     1.80 cm   LV E/e' lateral: 9.0 LV SV:         56 LV SV Index:   30 LVOT Area:  2.54 cm  RIGHT VENTRICLE          IVC RV Basal diam:  3.00 cm  IVC diam: 2.50 cm LEFT ATRIUM           Index        RIGHT ATRIUM           Index LA diam:      4.60 cm 2.46 cm/m   RA Area:     16.40 cm LA Vol (A2C): 87.6 ml 46.93 ml/m  RA Volume:   40.70 ml  21.80 ml/m LA Vol (A4C): 87.3 ml 46.77 ml/m  AORTIC VALVE AV  Area (Vmax):    1.75 cm AV Area (Vmean):   2.00 cm AV Area (VTI):     2.17 cm AV Vmax:           165.60 cm/s AV Vmean:          93.719 cm/s AV VTI:            0.261 m AV Peak Grad:      11.0 mmHg AV Mean Grad:      4.4 mmHg LVOT Vmax:         114.00 cm/s LVOT Vmean:        73.700 cm/s LVOT VTI:          0.222 m LVOT/AV VTI ratio: 0.85 AI PHT:            385 msec  AORTA Ao Root diam: 2.90 cm Ao Asc diam:  3.30 cm MITRAL VALVE MV Area (PHT): 3.60 cm    SHUNTS MV Decel Time: 211 msec    Systemic VTI:  0.22 m MV E velocity: 80.50 cm/s  Systemic Diam: 1.80 cm MV A velocity: 56.80 cm/s MV E/A ratio:  1.42 Michelle Lander MD Electronically signed by Michelle Lander MD Signature Date/Time: 12/22/2023/6:04:31 PM    Final    DG Foot 2 Views Left Result Date: 12/22/2023 CLINICAL DATA:  Left foot pain. EXAM: RIGHT FOOT - 2 VIEW; LEFT FOOT - 2 VIEW COMPARISON:  Right foot radiographs 11/13/2021. FINDINGS: Right foot: The bones appear demineralized. No evidence of acute fracture or dislocation. There are moderate degenerative changes at the 1st metatarsophalangeal joint. Mild hallux valgus deformity and mild hammertoe deformities of the 2nd and 3rd digits. No acute soft tissue abnormalities are identified. Artifact from the patient's socks. Left foot: No evidence of acute fracture or dislocation. There are moderate midfoot and 1st MTP joint degenerative changes. No focal soft tissue abnormalities are identified. IMPRESSION: No acute osseous findings in either foot. Moderate degenerative changes at the 1st MTP joints bilaterally. Electronically Signed   By: Elmon Hagedorn M.D.   On: 12/22/2023 13:28   DG Foot 2 Views Right Result Date: 12/22/2023 CLINICAL DATA:  Left foot pain. EXAM: RIGHT FOOT - 2 VIEW; LEFT FOOT - 2 VIEW COMPARISON:  Right foot radiographs 11/13/2021. FINDINGS: Right foot: The bones appear demineralized. No evidence of acute fracture or dislocation. There are moderate degenerative changes at the 1st  metatarsophalangeal joint. Mild hallux valgus deformity and mild hammertoe deformities of the 2nd and 3rd digits. No acute soft tissue abnormalities are identified. Artifact from the patient's socks. Left foot: No evidence of acute fracture or dislocation. There are moderate midfoot and 1st MTP joint degenerative changes. No focal soft tissue abnormalities are identified. IMPRESSION: No acute osseous findings in either foot. Moderate degenerative changes at the 1st MTP joints bilaterally. Electronically Signed   By: Muriel Arm.D.  On: 12/22/2023 13:28   DG Chest Port 1 View Result Date: 12/20/2023 CLINICAL DATA:  Shortness of breath EXAM: PORTABLE CHEST 1 VIEW COMPARISON:  11/04/2023 FINDINGS: Cardiac enlargement with pulmonary vascular congestion and perihilar/basilar infiltrates likely edema. This is progressing since prior study. Possible small pleural effusions. No pneumothorax. Mediastinal contours appear intact. Degenerative changes in the spine and shoulders. IMPRESSION: Cardiac enlargement with pulmonary vascular congestion and progressing edema since prior study. Probable small pleural effusions. Electronically Signed   By: Boyce Byes M.D.   On: 12/20/2023 21:45   ECHO TEE Result Date: 12/19/2023    TRANSESOPHOGEAL ECHO REPORT   Patient Name:   Michelle Wilcox Date of Exam: 12/19/2023 Medical Rec #:  [id number]               Height:       63.0 in Accession #:    [id number]              Weight:       184.0 lb Date of Birth:  1946-07-02               BSA:          1.866 m Patient Age:    77 years                BP:           156/89 mmHg Patient Gender: F                       HR:           102 bpm. Exam Location:  Inpatient Procedure: Transesophageal Echo, 3D Echo, Color Doppler, Cardiac Doppler and            Intracardiac Opacification Agent (Both Spectral and Color Flow            Doppler were utilized during procedure). Indications:     I48.91* Unspecified atrial fibrillation   History:         Patient has prior history of Echocardiogram examinations, most                  recent 11/05/2023. Arrythmias:Atrial Fibrillation; Risk                  Factors:Hypertension and Dyslipidemia.  Sonographer:     Michelle Wilcox Senior RDCS Referring Phys:  Michelle Wilcox Diagnosing Phys: Michelle Larger MD PROCEDURE: After discussion of the risks and benefits of a TEE, an informed consent was obtained from the patient. The transesophogeal probe was passed without difficulty through the esophogus of the patient. Sedation performed by different physician. The patient was monitored while under deep sedation. Anesthestetic sedation was provided intravenously by Anesthesiology: 160mg  of Propofol . The patient developed no complications during the procedure. A successful direct current cardioversion was performed at 200 joules with 1 attempt.  IMPRESSIONS  1. Left ventricular ejection fraction, by estimation, is 60 to 65%. Left ventricular ejection fraction by 3D volume is 65 %. The left ventricle has normal function.  2. Right ventricular systolic function is normal. The right ventricular size is mildly enlarged.  3. Left atrial size was severely dilated. No left atrial/left atrial appendage thrombus was detected.  4. Right atrial size was severely dilated.  5. Central jet of atrial, functional MR with blunted pulmonary vein signal. The mitral valve is normal in structure. Moderate to severe mitral valve regurgitation. No evidence of mitral stenosis.  6. Atrial functional tricuspid regurgitation-  two jets. Tricuspid valve regurgitation is moderate.  7. There is a Wilcox central jet and a smaller, commissural jet between the non and left cusp; central jet 2DVC: 0.3 cm. The aortic valve is tricuspid. Aortic valve regurgitation is moderate. No aortic stenosis is present.  8. 3D performed of the LAA and demonstrates Patent LAA- Chicken wing morphology. 23 mm maximal diameter.  9. Cannot exclude a small PFO. FINDINGS   Left Ventricle: Left ventricular ejection fraction, by estimation, is 60 to 65%. Left ventricular ejection fraction by 3D volume is 65 %. The left ventricle has normal function. Definity  contrast agent was given IV to delineate the left ventricular endocardial borders. The left ventricular internal cavity size was normal in size. Right Ventricle: The right ventricular size is mildly enlarged. No increase in right ventricular wall thickness. Right ventricular systolic function is normal. Left Atrium: Left atrial size was severely dilated. No left atrial/left atrial appendage thrombus was detected. Right Atrium: Right atrial size was severely dilated. Pericardium: There is no evidence of pericardial effusion. Mitral Valve: Central jet of atrial, functional MR with blunted pulmonary vein signal. The mitral valve is normal in structure. Moderate to severe mitral valve regurgitation. No evidence of mitral valve stenosis. Tricuspid Valve: Atrial functional tricuspid regurgitation- two jets. The tricuspid valve is normal in structure. Tricuspid valve regurgitation is moderate . No evidence of tricuspid stenosis. Aortic Valve: There is a Wilcox central jet and a smaller, commissural jet between the non and left cusp; central jet 2DVC: 0.3 cm. The aortic valve is tricuspid. Aortic valve regurgitation is moderate. No aortic stenosis is present. Pulmonic Valve: The pulmonic valve was normal in structure. Pulmonic valve regurgitation is trivial. No evidence of pulmonic stenosis. Aorta: The aortic root and ascending aorta are structurally normal, with no evidence of dilitation. IAS/Shunts: There is right bowing of the interatrial septum, suggestive of elevated left atrial pressure. Cannot exclude a small PFO. Additional Comments: 3D was performed not requiring image post processing on an independent workstation and was normal. Spectral Doppler performed.  3D Volume EF LV 3D EF:    Left ventricular ejection fraction by 3D volume  is 65              %. LV 3D EDV:   47.36 ml LV 3D ESV:   16.50 ml  3D Volume EF: 3D EF:        65 % Michelle Larger MD Electronically signed by Michelle Larger MD Signature Date/Time: 12/19/2023/12:39:33 PM    Final    EP STUDY Result Date: 12/19/2023 See surgical note for result.   Microbiology: No results found for this or any previous visit (from the past 240 hours).   Labs: Basic Metabolic Panel: Recent Labs  Lab 12/21/23 0025 12/21/23 0434 12/22/23 0326 12/23/23 0645 12/24/23 0539 12/25/23 0649  NA  --  135 141 136 140 142  K  --  3.4* 3.9 3.6 3.6 4.3  CL  --  99 103 97* 99 99  CO2  --  24 28 30  33* 31  GLUCOSE  --  129* 100* 110* 89 95  BUN  --  9 11 11 15 16   CREATININE  --  0.88 0.94 0.81 0.85 0.90  CALCIUM   --  7.9* 8.1* 8.6* 8.6* 9.2  MG 2.3  --  2.1  --   --   --    Liver Function Tests: Recent Labs  Lab 12/20/23 2128 12/22/23 0326 12/23/23 0645 12/24/23 0539 12/25/23 0649  AST 46*  21 21 19 22   ALT 18 12 11 11 12   ALKPHOS 63 37* 40 42 44  BILITOT 0.9 0.8 0.6 0.7 0.7  PROT 6.5 5.4* 5.4* 5.8* 5.8*  ALBUMIN 4.1 3.2* 3.0* 3.1* 3.0*   No results for input(s): "LIPASE", "AMYLASE" in the last 168 hours. No results for input(s): "AMMONIA" in the last 168 hours. CBC: Recent Labs  Lab 12/20/23 2128 12/20/23 2137 12/21/23 0434 12/22/23 0326 12/23/23 0645 12/24/23 0539 12/25/23 0649  WBC 18.8*  --  14.1* 10.1 12.2* 8.4 7.9  NEUTROABS 13.7*  --   --   --   --   --   --   HGB 14.3   < > 11.9* 13.0 13.3 14.2 14.6  HCT 44.8   < > 36.5 39.2 40.4 42.7 44.4  MCV 100.2*  --  98.4 98.0 96.2 97.0 97.8  PLT 354  --  290 288 330 354 369   < > = values in this interval not displayed.   Cardiac Enzymes: No results for input(s): "CKTOTAL", "CKMB", "CKMBINDEX", "TROPONINI" in the last 168 hours. BNP: BNP (last 3 results) Recent Labs    11/04/23 1619 12/20/23 2128  BNP 629.3* 193.9*    ProBNP (last 3 results) No results for input(s): "PROBNP" in the  last 8760 hours.  CBG: No results for input(s): "GLUCAP" in the last 168 hours.     Signed:  Deforest Fast MD.  [facility] 12/25/2023, 11:18 AM

## 2023-12-25 NOTE — Progress Notes (Signed)
   12/25/23 1040  Mobility  Activity Ambulated with assistance in hallway  Level of Assistance Standby assist, set-up cues, supervision of patient - no hands on  Assistive Device Four wheel walker  Distance Ambulated (ft) 325 ft  Activity Response Tolerated well  Mobility Referral Yes  Mobility visit 1 Mobility  Mobility Specialist Start Time (ACUTE ONLY) 1040  Mobility Specialist Stop Time (ACUTE ONLY) 1105  Mobility Specialist Time Calculation (min) (ACUTE ONLY) 25 min   Pt eager for mobility. Required no physical assistance during ambulation, SV. VSS on RA. Pt c/o feeling a bit fatigued after ambulating. Pt returned back to bed with all needs met, ice packs on feet.   Janit Meline Mobility Specialist Please contact via SecureChat or [employer] [phone]

## 2023-12-25 NOTE — Progress Notes (Signed)
 Heart Failure Navigator Progress Note  Assessed for Heart & Vascular TOC clinic readiness.  Patient does not meet criteria due to EF 60-65%, has a scheduled [facility] appointment on 01/10/2024. No HF TOC. .   Navigator will sign off at this time.   Randie Bustle, BSN, Scientist, clinical (histocompatibility and immunogenetics) Only

## 2023-12-25 NOTE — Progress Notes (Addendum)
 Patient Name: Michelle Wilcox Date of Encounter: 12/25/2023 [facility] Cardiologist: Randene Bustard, MD   Interval Summary  .    Resting comfortably in bed, on room air  Reports feeling tired but overall okay  Has been experiencing foot pain on IV Lasix , feels better today  Transitioned to PO Lasix   Likely will be able to discharge home today with ZIO monitor   Vital Signs .    Vitals:   12/25/23 0022 12/25/23 0441 12/25/23 0757 12/25/23 0820  BP: 114/61 (!) 96/54 (!) 140/85 (!) 140/85  Pulse:  67  91  Resp: 17 17 19    Temp: 98.1 F (36.7 C) 97.7 F (36.5 C) 98.1 F (36.7 C)   TempSrc: Oral Oral Oral   SpO2:      Weight:      Height:        Intake/Output Summary (Last 24 hours) at 12/25/2023 0903 Last data filed at 12/25/2023 0448 Gross per 24 hour  Intake 240 ml  Output 700 ml  Net -460 ml      12/21/2023    1:11 PM 12/20/2023    9:24 PM 12/12/2023    8:52 AM  Last 3 Weights  Weight (lbs) 184 lb 1.6 oz 182 lb 15.7 oz 184 lb  Weight (kg) 83.507 kg 83 kg 83.462 kg      Telemetry/ECG    Atrial fibrillation, HR 70-80s  - Personally Reviewed  Physical Exam .   GEN: No acute distress, on room air.   Neck: No JVD Cardiac: irregularly irregular rhythm, no murmurs, rubs, or gallops.  Respiratory: mild rales in lung bases bilaterally. GI: Soft, nontender, non-distended  MS: No edema  Assessment & Plan .     Persistent atrial fibrillation  Patient was first diagnosed with A. Fib in 10/2023. Started on amiodarone , digoxin , metoprolol   Previously underwent TEE guided DCCV on 5/1 TEE noted severe biatrial enlargement, moderate-severe MR, moderate TR  Presented 5/2 with shortness of breath. Found to be in A. Fib with RVR and had evidence of hypervolemia  Loaded with IV amiodarone . HR in the 70s-80s. Remains in A. Fib  With severe biatrial enlargement and valvular disease, it may be difficult for her to maintain NSR  TSH, LFTs normal this admission   Seen by EP yesterday- per their recs, plan for 2 week zio at DC -- if mainly A. Fib then she should be transitioned to a pure rate control strategy  Continue amiodarone  200 mg daily Continue digoxin  0.125 mg daily Continue metoprolol  sucinate 50 mg BID  Continue Eliquis  5 mg BID   Valve Disease  TEE 5/1 noted moderate-severe mitral valve regurgitation (functional MR), moderate TR, moderate AI  Surface echo this admission showed trivial MR, mild-moderate AI    Acute on Chronic Diastolic Heart Failure Echo this admission showed EF 60-65%, no regional wall motion abnormalities, normal RV systolic function, moderately dilated LA, mild-moderate AI  BP elevated to 193.3 CXR showed pulmonary vascular congestion, progressive edema, probable small pleural effusions  Creatinine stable Received IV Lasix  this admission, transitioned to PO today Started PO Lasix  40 mg daily today  Continue spironolactone 12.5 mg daily    Elevated Troponin  Troponin levels 28>544 No chest pain or acute EKG changes Likely demand ischemia from afib, hypervolemia  Echo this admission with EF 60-65%, no wall motion abnormalities  Anticipate outpatient PET   Otherwise per primary  Polymyalgia rheumatica  Leukocytosis   For questions or updates, please contact [facility] Please consult [email or web address] for contact info under        Signed, Jiles Mote, PA-C   I have personally seen and examined this patient. I agree with the assessment and plan as outlined above.   No complaints. Labs reviewed.  Tele with atrial fib My exam: NAD  Lungs: clear WU:[id number] irreg Plan: Rate controlled atrial fib today. Continue amio, dig, metoprolol  and Eliquis . We will arrange a Zio monitor at discharge. Her volume status is ok. Continue lasix  and spiro OK to d/c home today. We will arrange outpatient cardiac follow up.  Antoinette Batman, MD, [facility] 12/25/2023 10:18 AM

## 2023-12-26 ENCOUNTER — Ambulatory Visit: Admitting: Physical Therapy

## 2023-12-26 ENCOUNTER — Other Ambulatory Visit (HOSPITAL_COMMUNITY): Payer: Self-pay

## 2023-12-26 MED ORDER — POTASSIUM CHLORIDE CRYS ER 20 MEQ PO TBCR
20.0000 meq | EXTENDED_RELEASE_TABLET | Freq: Every day | ORAL | 0 refills | Status: DC
  Filled 2023-12-26: qty 30, 30d supply, fill #0

## 2023-12-26 NOTE — Plan of Care (Signed)
   Problem: Education: Goal: Knowledge of General Education information will improve Description Including pain rating scale, medication(s)/side effects and non-pharmacologic comfort measures Outcome: Progressing   Problem: Health Behavior/Discharge Planning: Goal: Ability to manage health-related needs will improve Outcome: Progressing

## 2023-12-27 ENCOUNTER — Ambulatory Visit: Attending: Cardiology

## 2023-12-27 ENCOUNTER — Telehealth: Payer: Self-pay | Admitting: Cardiology

## 2023-12-27 NOTE — Telephone Encounter (Signed)
 Spoke to patient Michelle West NP's advice given.Stated she is not having symptoms.She will continue to monitor B/P daily and bring readings to appointment 5/23 at 2:20 pm with Michelle West NP.

## 2023-12-27 NOTE — Telephone Encounter (Signed)
 Spoke to patient advised monitor tech can put your monitor on this afternoon at 3:30 pm. Stated she accidentally took Spironolactone  25 mg 1 tablet yesterday instead of 1/2 tablet.Stated she will take 1/2 tablet 12.5 mg daily.I will send B/P readings to [name] NP for review.

## 2023-12-27 NOTE — Telephone Encounter (Signed)
  Pt c/o BP issue: STAT if pt c/o blurred vision, one-sided weakness or slurred speech.  STAT if BP is GREATER than 180/120 TODAY.  STAT if BP is LESS than 90/60 and SYMPTOMATIC TODAY  1. What is your BP concern? Pt states her bp has been low since hospital discharge.   2. Have you taken any BP medication today? No because she states it was too low for   3. What are your last 5 BP readings? 5/8: 112/65          109/78        100/73         102/68 5/9: 107/67         100/62   4. Are you having any other symptoms (ex. Dizziness, headache, blurred vision, passed out)? No   Pt called in stating she also received heart monitor and asked if she can come this afternoon to have someone help her put it on. Please advise

## 2023-12-31 ENCOUNTER — Telehealth: Payer: Self-pay | Admitting: Cardiology

## 2023-12-31 ENCOUNTER — Ambulatory Visit: Attending: Internal Medicine | Admitting: Physical Therapy

## 2023-12-31 DIAGNOSIS — R262 Difficulty in walking, not elsewhere classified: Secondary | ICD-10-CM | POA: Insufficient documentation

## 2023-12-31 DIAGNOSIS — R29898 Other symptoms and signs involving the musculoskeletal system: Secondary | ICD-10-CM | POA: Insufficient documentation

## 2023-12-31 DIAGNOSIS — R2681 Unsteadiness on feet: Secondary | ICD-10-CM | POA: Diagnosis not present

## 2023-12-31 DIAGNOSIS — M6281 Muscle weakness (generalized): Secondary | ICD-10-CM | POA: Diagnosis not present

## 2023-12-31 DIAGNOSIS — R293 Abnormal posture: Secondary | ICD-10-CM | POA: Diagnosis not present

## 2023-12-31 DIAGNOSIS — I509 Heart failure, unspecified: Secondary | ICD-10-CM | POA: Insufficient documentation

## 2023-12-31 DIAGNOSIS — R2689 Other abnormalities of gait and mobility: Secondary | ICD-10-CM | POA: Insufficient documentation

## 2023-12-31 NOTE — Telephone Encounter (Signed)
 Spoke with patient and she states FMLA  paper work was faxed yesterday. Informed patient it didn't look like we received paper work. She was given the correct fax number.

## 2023-12-31 NOTE — Telephone Encounter (Signed)
 Patient says short-term disability paperwork expires on 5/18 and the [facility] faxed new paperwork to the office yesterday. She says it will be for about 8 weeks of disability (possible a little less). She would like a call back with updates.

## 2023-12-31 NOTE — Therapy (Signed)
 OUTPATIENT PHYSICAL THERAPY NEURO TREATMENT/RECERT/PROGRESS NOTE   Patient Name: Michelle Wilcox MRN: [id number] DOB:1946/04/10, 78 y.o., female Today's Date: 01/01/2024  PCP: [name], MD REFERRING PROVIDER: Lesa Rape, MD  Progress Note Reporting Period 11/14/2023 to 12/31/2023  See note below for Objective Data and Assessment of Progress/Goals.     END OF SESSION:  PT End of Session - 12/31/23 1518     Visit Number 9    Number of Visits 18    Date for PT Re-Evaluation 02/07/24    Authorization Type BCBS Medicare    Progress Note Due on Visit 19   PN completed at visit 9   PT Start Time 1451    PT Stop Time 1529    PT Time Calculation (min) 38 min    Activity Tolerance Patient tolerated treatment well;Patient limited by fatigue    Behavior During Therapy WFL for tasks assessed/performed   Tearful at times                Past Medical History:  Diagnosis Date   Anxiety    hx of   Asthma    at times   CHF (congestive heart failure) (HCC)    Depression    hx of   Dyspnea    Giant cell arteritis (HCC)    Gouty arthropathy    Hemolytic anemia (HCC)    Hyperlipidemia    Hypertension    Irregular heart beat    extra beat   Migraines    Polymyalgia (HCC)    Polymyalgia rheumatica (HCC)    Temporal arteritis (HCC)    Past Surgical History:  Procedure Laterality Date   BARTHOLIN GLAND CYST EXCISION     non ca   BREAST BIOPSY Right    CARDIOVERSION N/A 11/05/2023   Procedure: CARDIOVERSION;  Surgeon: Jerryl Morin, DO;  Location: [facility];  Service: Cardiovascular;  Laterality: N/A;   CARDIOVERSION N/A 12/19/2023   Procedure: CARDIOVERSION;  Surgeon: Jann Melody, MD;  Location: [facility];  Service: Cardiovascular;  Laterality: N/A;   EYE MUSCLE SURGERY     as a child 54 yrs old   KNEE SURGERY  2004   rt knee   LAPAROSCOPIC SPLENECTOMY N/A 06/18/2018   Procedure: LAPAROSCOPIC SPLENECTOMY ERAS PATHWAY;  Surgeon:  Ayesha Lente, MD;  Location: [facility];  Service: General;  Laterality: N/A;   toncil     TONSILLECTOMY     as a child   TRANSESOPHAGEAL ECHOCARDIOGRAM (CATH LAB) N/A 11/05/2023   Procedure: TRANSESOPHAGEAL ECHOCARDIOGRAM;  Surgeon: Jerryl Morin, DO;  Location: [facility];  Service: Cardiovascular;  Laterality: N/A;   TRANSESOPHAGEAL ECHOCARDIOGRAM (CATH LAB) N/A 12/19/2023   Procedure: TRANSESOPHAGEAL ECHOCARDIOGRAM;  Surgeon: Jann Melody, MD;  Location: [facility];  Service: Cardiovascular;  Laterality: N/A;   Patient Active Problem List   Diagnosis Date Noted   Acute on chronic congestive heart failure (HCC) 12/24/2023   Acute on chronic HFrEF (heart failure with reduced ejection fraction) (HCC) 12/21/2023   Atrial fibrillation with rapid ventricular response (HCC) 12/21/2023   Atrial fibrillation, rapid (HCC) 12/21/2023   Pericardial effusion 11/07/2023   Atrial fibrillation with RVR (HCC) 11/05/2023   Borderline abnormal TFTs 11/05/2023   Macrocytosis 11/05/2023   Atrial fibrillation (HCC) 11/04/2023   PMR (polymyalgia rheumatica) (HCC) 11/04/2023   Primary localized osteoarthrosis of multiple sites 06/15/2022   Diastolic dysfunction without heart failure 03/05/2018   DOE (dyspnea on exertion) 03/05/2018   Preop cardiovascular  exam 03/05/2018   Mild reactive airways disease 08/04/2017   Volume overload 08/03/2017   Hypokalemia 08/03/2017   Essential hypertension 08/03/2017   Hyperlipidemia 08/03/2017   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   Hypersensitivity reaction 11/20/2016   Drusen (degenerative) of retina, bilateral 05/22/2016   Nuclear cataract 05/22/2016   Retinal hemorrhage of left eye 05/22/2016   Temporal arteritis (HCC) 05/22/2016   Hemolytic anemia (HCC) 03/19/2016   Breast mass, right 07/01/2012    ONSET DATE: 11/04/23  REFERRING DIAG: I48.91 (ICD-10-CM) - Atrial fibrillation with RVR (HCC)  THERAPY DIAG:  Other  abnormalities of gait and mobility  Muscle weakness (generalized)  Unsteadiness on feet  Rationale for Evaluation and Treatment: Rehabilitation  SUBJECTIVE:                                                                                                                                                                                             SUBJECTIVE STATEMENT: Had the cardioversion once, and then it didn't hold for full 24 hrs.  (The blood clot was gone).  After the next day, felt like I couldn't breathe and had to go to ED. Was in hospital and on O2.  Came home with no oxygen, monitoring BP and HR.  Not thinking an ablation is an option.  They will likely not do another cardioversion; they will try to manage by medication. Pt accompanied by: self  PERTINENT HISTORY: 12/31/2023:  Pt underwent attempt at cardioversion for a-fib on 12/19/23, which did not last >24 hrs; was hospitalized after ED visit for SOB, CHF (*New orders received for PT resume and treat).  Anxiety, asthma, CHF, depression, gouty arthropathy, anemia, HLD, HTN, migraines, polymyalgia rheumatica, temporal arteritis, R knee surgery   PAIN:  Are you having pain? Yes: NPRS scale: "no" Pain location: R knee Pain description: sore Aggravating factors: previous fall Relieving factors: knee brace  PRECAUTIONS: Fall; per Cardiac NP's recommendation: goal resting HR is <110bpm. goal heart rate is <135 bpm with exertion.  12/31/2023:  Have HR monitor (will wear for 14 days).  Monitor BP  WEIGHT BEARING RESTRICTIONS: No  FALLS: Has patient fallen in last 6 months? Yes. Number of falls 1  LIVING ENVIRONMENT: Lives with: lives with their spouse who has stage 4 stomach CA (pt reports that she is having to physically assist him "some") Lives in: Other townhouse  Stairs: 2 stories, sleeps on 1st floor in recliner  Has following equipment at home: Walker - 2 wheeled, cane  PLOF: Independent; currently getting assistance with meals    PATIENT GOALS: improve fatigue   OBJECTIVE:    TODAY'S TREATMENT: 12/31/2023  Activity Comments  Vitals:  96%, HR 81 bpm 109/78   FTSTS:  45.41 sec arms crossed at chest   68M walk:  17.54 sec = 1.87 ft/sec   Seated march 2 x 10 LAQ 2 x 10 Ankle pumps 2 x 10 Seated step out and in, 2 x 10 Rates 4/10 RPE  EO Romberg EC Romberg Solid surface-mild sway, unable to achieve Romberg EC  Vitals 96%, HR 78 bpm 110/63     Access Code: [email or web address] URL: [email or web address] Date: 12/31/2023 Prepared by: [facility] - Outpatient  Rehab - [facility]  Program Notes Seated step out and in, each leg, 3 x 10 reps  Exercises - Seated March  - 1 x daily - 7 x weekly - 3 sets - 10 reps - Seated Long Arc Quad  - 1 x daily - 7 x weekly - 3 sets - 10 reps - Seated Ankle Dorsiflexion AROM  - 1 x daily - 7 x weekly - 3 sets - 10 reps  PATIENT EDUCATION: Education details: HEP updates (new code given for MedBridge today), POC and progress with goals Person educated: Patient Education method: Explanation, Demonstration, and Handouts Education comprehension: verbalized understanding, returned demonstration, and needs further education   HOME EXERCISE PROGRAM (previously given HEP) Access Code: [facility] URL: [email or web address] Date: 12/05/2023 Prepared by: [facility] - Outpatient  Rehab - [facility]  Program Notes monitor your heart rate during exercise and make sure it does not go above 135 bpm  Exercises - Seated Bilateral Shoulder External Rotation with Resistance  - 1 x daily - 5 x weekly - 1 sets - 10 reps - Seated Flexion Stretch with Swiss Ball  - 1 x daily - 5 x weekly - 2 sets - 5 reps - 3-5 sec hold - Supine Lower Trunk Rotation  - 1 x daily - 5 x weekly - 2 sets - 10 reps - Supine Bridge  - 1 x daily - 5 x weekly - 2 sets - 10 reps - Side Stepping with Counter Support  - 1 x daily - 5 x weekly - 2-3 sets - 1 min hold - Standing March with  Counter Support  - 1 x daily - 5 x weekly - 2-3 sets - 20 reps - Sit to Stand with Arms Crossed  - 1 x daily - 5 x weekly - 2-3 sets - 10 reps - Standing Hip Abduction with Counter Support  - 1 x daily - 5 x weekly - 2-3 sets - 10 reps  ______________________________________________ Note: Objective measures were completed at Evaluation unless otherwise noted.  Vitals: 96% spO2, 97 bpm (102 bpm taken manually),  115/85 mmHg  DIAGNOSTIC FINDINGS: 11/05/23 echo TEE: Left Atrium: Left atrial size was severely dilated. A left atrial/left  atrial appendage thrombus was detected.   COGNITION: Overall cognitive status: Within functional limits for tasks assessed   SENSATION: WFL  POSTURE: rounded shoulders and forward head  LOWER EXTREMITY MMT:   MMT (in sitting) Right Eval Left Eval  Hip flexion 4+ 4+  Hip extension    Hip abduction 4+ 4+  Hip adduction 4+ 4+  Hip internal rotation    Hip external rotation    Knee flexion 4+ 4+  Knee extension 4+ 4  Ankle dorsiflexion 4+ 4+  Ankle plantarflexion 4 4  Ankle inversion    Ankle eversion    (Blank rows = not tested)  GAIT: Gait pattern: slow gait speed, pushing walker too far forward Assistive device utilized: Environmental consultant -  2 wheeled Level of assistance: SBA  FUNCTIONAL TESTS:  5xSTS: 26.23 sec pushing off knees and audible R knee crepitus  Pre: 97% spO2, 89bpm  Post: 95% spO2, 102bpm   54M walk test: 17.53 sec with RW (1.87 ft/sec)                                                                                                                 __________________________________________________________________________________ GOALS: Goals reviewed with patient? Yes  SHORT TERM GOALS: Target date: 12/05/2023  Patient to be independent with initial HEP. Baseline: HEP initiated Goal status: MET  LONG TERM GOALS: Target date: 12/26/2023  Patient to be independent with advanced HEP. Baseline: Not yet initiated  Goal status: IN  PROGRESS, 12/31/2023  Patient to demonstrate gait speed of at least 2.0 ft/sec in order to improve access to community.  Baseline: 1.87 ft/sec 12/31/2023 Goal status: IN PROGRESS, 12/31/2023  Patient to report tolerance for grocery trip without fatigue limiting.    Baseline: unable Goal status: IN PROGRESS, 12/31/2023  6 min walk test to improve to >600 ft with device as needed Baseline: 437 ft>710 ft in 6 MWT 12/17/23 Goal status: MET 12/17/2023  Patient to demonstrate 5xSTS test in <15 sec in order to decrease risk of falls.  Baseline: 26.23 sec pushing off knees and audible R knee crepitus; 45.41 seconds with arms crossed at chest 5/13 Goal status: IN PROGRESS, 12/31/2023   ASSESSMENT:  CLINICAL IMPRESSION: Pt presents today and recert completed.  Pt underwent cardioversion 12/19/2023 and per report, a-fib returned within 24 hrs.  She had to return to ED/hospital, where she was on supplemental O2.  She is currently on heart monitor, as they are monitoring a-fib and she has received return to PT orders.  She feels that her strength, endurance, balance have decreased since her recent time in the hospital.  She is at fall risk per gait velocity and FTSTS scores.  She will benefit from skilled PT to address the above noted deficits for pt to return to optimal level of independence, functional mobility and decreased fall risk.  OBJECTIVE IMPAIRMENTS: Abnormal gait, cardiopulmonary status limiting activity, decreased activity tolerance, decreased balance, difficulty walking, decreased strength, and pain.   PLAN:  PT FREQUENCY: 2x/week  PT DURATION: other: 5 weeks, including 12/31/2023 week and recert  PLANNED INTERVENTIONS: 97164- PT Re-evaluation, 97110-Therapeutic exercises, 97530- Therapeutic activity, 97112- Neuromuscular re-education, 97535- Self Care, 63875- Manual therapy, [phone]- Gait training, [phone]- Canalith repositioning, [phone]- Aquatic Therapy, Patient/Family education, Balance training,  Stair training, Taping, Dry Needling, Vestibular training, DME instructions, Cryotherapy, and Moist heat  PLAN FOR NEXT SESSION: Review HEP (seated) and progress balance/gait/endurance while monitoring vitals, possibly using interval training , general conditioning and strengthening.   Dessie Flow, PT 01/01/24 5:23 PM Phone: [phone] Fax: [phone]  [facility] at [facility] [address] [place], [address] [place], [place] [address] Phone # [phone] Fax # [phone]

## 2024-01-01 NOTE — Telephone Encounter (Signed)
 The [employer] disability form is in Dr. Jill Motes box.  KStephenie Einstein filled out the last form but the patient needs this faxed to [facility] before end of day Friday, 5/16. K. Stephenie Einstein is not here Thursday or Friday this week.  Patient is coming in on Friday to pay the form fee of $29.

## 2024-01-02 ENCOUNTER — Ambulatory Visit

## 2024-01-02 DIAGNOSIS — R29898 Other symptoms and signs involving the musculoskeletal system: Secondary | ICD-10-CM | POA: Diagnosis not present

## 2024-01-02 DIAGNOSIS — R2681 Unsteadiness on feet: Secondary | ICD-10-CM

## 2024-01-02 DIAGNOSIS — R262 Difficulty in walking, not elsewhere classified: Secondary | ICD-10-CM

## 2024-01-02 DIAGNOSIS — R293 Abnormal posture: Secondary | ICD-10-CM

## 2024-01-02 DIAGNOSIS — M6281 Muscle weakness (generalized): Secondary | ICD-10-CM

## 2024-01-02 DIAGNOSIS — I509 Heart failure, unspecified: Secondary | ICD-10-CM | POA: Diagnosis not present

## 2024-01-02 DIAGNOSIS — R2689 Other abnormalities of gait and mobility: Secondary | ICD-10-CM

## 2024-01-02 NOTE — Therapy (Signed)
 OUTPATIENT PHYSICAL THERAPY NEURO TREATMENT   Patient Name: Michelle Wilcox MRN: [id number] DOB:01/30/1946, 78 y.o., female Today's Date: 01/02/2024  PCP: [name], MD REFERRING PROVIDER: Lesa Rape, MD     END OF SESSION:  PT End of Session - 01/02/24 1612     Visit Number 10    Number of Visits 18    Date for PT Re-Evaluation 02/07/24    Authorization Type [employer] Medicare    Progress Note Due on Visit 19   PN completed at visit 9   PT Start Time 1615    PT Stop Time 1700    PT Time Calculation (min) 45 min    Activity Tolerance Patient tolerated treatment well;Patient limited by fatigue    Behavior During Therapy WFL for tasks assessed/performed   Tearful at times                Past Medical History:  Diagnosis Date   Anxiety    hx of   Asthma    at times   CHF (congestive heart failure) (HCC)    Depression    hx of   Dyspnea    Giant cell arteritis (HCC)    Gouty arthropathy    Hemolytic anemia (HCC)    Hyperlipidemia    Hypertension    Irregular heart beat    extra beat   Migraines    Polymyalgia (HCC)    Polymyalgia rheumatica (HCC)    Temporal arteritis (HCC)    Past Surgical History:  Procedure Laterality Date   BARTHOLIN GLAND CYST EXCISION     non ca   BREAST BIOPSY Right    CARDIOVERSION N/A 11/05/2023   Procedure: CARDIOVERSION;  Surgeon: Jerryl Morin, DO;  Location: [facility];  Service: Cardiovascular;  Laterality: N/A;   CARDIOVERSION N/A 12/19/2023   Procedure: CARDIOVERSION;  Surgeon: Jann Melody, MD;  Location: [facility];  Service: Cardiovascular;  Laterality: N/A;   EYE MUSCLE SURGERY     as a child 12 yrs old   KNEE SURGERY  2004   rt knee   LAPAROSCOPIC SPLENECTOMY N/A 06/18/2018   Procedure: LAPAROSCOPIC SPLENECTOMY ERAS PATHWAY;  Surgeon: Ayesha Lente, MD;  Location: [facility];  Service: General;  Laterality: N/A;   toncil     TONSILLECTOMY     as a child   TRANSESOPHAGEAL  ECHOCARDIOGRAM (CATH LAB) N/A 11/05/2023   Procedure: TRANSESOPHAGEAL ECHOCARDIOGRAM;  Surgeon: Jerryl Morin, DO;  Location: [facility];  Service: Cardiovascular;  Laterality: N/A;   TRANSESOPHAGEAL ECHOCARDIOGRAM (CATH LAB) N/A 12/19/2023   Procedure: TRANSESOPHAGEAL ECHOCARDIOGRAM;  Surgeon: Jann Melody, MD;  Location: [facility];  Service: Cardiovascular;  Laterality: N/A;   Patient Active Problem List   Diagnosis Date Noted   Acute on chronic congestive heart failure (HCC) 12/24/2023   Acute on chronic HFrEF (heart failure with reduced ejection fraction) (HCC) 12/21/2023   Atrial fibrillation with rapid ventricular response (HCC) 12/21/2023   Atrial fibrillation, rapid (HCC) 12/21/2023   Pericardial effusion 11/07/2023   Atrial fibrillation with RVR (HCC) 11/05/2023   Borderline abnormal TFTs 11/05/2023   Macrocytosis 11/05/2023   Atrial fibrillation (HCC) 11/04/2023   PMR (polymyalgia rheumatica) (HCC) 11/04/2023   Primary localized osteoarthrosis of multiple sites 06/15/2022   Diastolic dysfunction without heart failure 03/05/2018   DOE (dyspnea on exertion) 03/05/2018   Preop cardiovascular exam 03/05/2018   Mild reactive airways disease 08/04/2017   Volume overload 08/03/2017   Hypokalemia 08/03/2017  Essential hypertension 08/03/2017   Hyperlipidemia 08/03/2017   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   Hypersensitivity reaction 11/20/2016   Drusen (degenerative) of retina, bilateral 05/22/2016   Nuclear cataract 05/22/2016   Retinal hemorrhage of left eye 05/22/2016   Temporal arteritis (HCC) 05/22/2016   Hemolytic anemia (HCC) 03/19/2016   Breast mass, right 07/01/2012    ONSET DATE: 11/04/23  REFERRING DIAG: I48.91 (ICD-10-CM) - Atrial fibrillation with RVR (HCC)  THERAPY DIAG:  Other abnormalities of gait and mobility  Muscle weakness (generalized)  Unsteadiness on feet  Other symptoms and signs involving the musculoskeletal  system  Difficulty in walking, not elsewhere classified  Abnormal posture  Rationale for Evaluation and Treatment: Rehabilitation  SUBJECTIVE:                                                                                                                                                                                             SUBJECTIVE STATEMENT: Having issues with low BP as of late due to diuretic. Have been increasing water intake to compensate  Pt accompanied by: self  PERTINENT HISTORY: 12/31/2023:  Pt underwent attempt at cardioversion for a-fib on 12/19/23, which did not last >24 hrs; was hospitalized after ED visit for SOB, CHF (*New orders received for PT resume and treat).  Anxiety, asthma, CHF, depression, gouty arthropathy, anemia, HLD, HTN, migraines, polymyalgia rheumatica, temporal arteritis, R knee surgery   PAIN:  Are you having pain? Yes: NPRS scale: "no" Pain location: R knee Pain description: sore Aggravating factors: previous fall Relieving factors: knee brace  PRECAUTIONS: Fall; per Cardiac NP's recommendation: goal resting HR is <110bpm. goal heart rate is <135 bpm with exertion.  12/31/2023:  Have HR monitor (will wear for 14 days).  Monitor BP  WEIGHT BEARING RESTRICTIONS: No  FALLS: Has patient fallen in last 6 months? Yes. Number of falls 1  LIVING ENVIRONMENT: Lives with: lives with their spouse who has stage 4 stomach CA (pt reports that she is having to physically assist him "some") Lives in: Other townhouse  Stairs: 2 stories, sleeps on 1st floor in recliner  Has following equipment at home: Environmental consultant - 2 wheeled, cane  PLOF: Independent; currently getting assistance with meals   PATIENT GOALS: improve fatigue   OBJECTIVE:   TODAY'S TREATMENT: 01/02/24 Activity Comments  Vitals: 120/71 mmHg, 79 bpm   LAQ 3x15 3#  Hip add iso 3x15   Seated clamshell 3x15 Green band  Seated hamstring curls 3x15 Green band  HR remains < 100 bpm w/ exertion    Standing alt hand-knee taps 2x15  3#  balance at counter -forward tandem/retro walk x  2 min -sidestepping x 2 min HR 92 bpm -standing on foam: EO/EC, head turns EO/EC x 30 sec. Reaching across midline for cones left/right x 10.  -standing on foam: mild-mod postural perturbations x 60 sec      TODAY'S TREATMENT: 12/31/2023 Activity Comments  Vitals:  96%, HR 81 bpm 109/78   FTSTS:  45.41 sec arms crossed at chest   84M walk:  17.54 sec = 1.87 ft/sec   Seated march 2 x 10 LAQ 2 x 10 Ankle pumps 2 x 10 Seated step out and in, 2 x 10 Rates 4/10 RPE  EO Romberg EC Romberg Solid surface-mild sway, unable to achieve Romberg EC  Vitals 96%, HR 78 bpm 110/63     Access Code: [email or web address] URL: [email or web address] Date: 12/31/2023 Prepared by: [facility] - Outpatient  Rehab - [facility]  Program Notes Seated step out and in, each leg, 3 x 10 reps  Exercises - Seated March  - 1 x daily - 7 x weekly - 3 sets - 10 reps - Seated Long Arc Quad  - 1 x daily - 7 x weekly - 3 sets - 10 reps - Seated Ankle Dorsiflexion AROM  - 1 x daily - 7 x weekly - 3 sets - 10 reps  PATIENT EDUCATION: Education details: HEP updates (new code given for MedBridge today), POC and progress with goals Person educated: Patient Education method: Explanation, Demonstration, and Handouts Education comprehension: verbalized understanding, returned demonstration, and needs further education   HOME EXERCISE PROGRAM (previously given HEP) Access Code: [facility] URL: [email or web address] Date: 12/05/2023 Prepared by: [facility] - Outpatient  Rehab - [facility]  Program Notes monitor your heart rate during exercise and make sure it does not go above 135 bpm  Exercises - Seated Bilateral Shoulder External Rotation with Resistance  - 1 x daily - 5 x weekly - 1 sets - 10 reps - Seated Flexion Stretch with Swiss Ball  - 1 x daily - 5 x weekly - 2 sets - 5 reps - 3-5 sec  hold - Supine Lower Trunk Rotation  - 1 x daily - 5 x weekly - 2 sets - 10 reps - Supine Bridge  - 1 x daily - 5 x weekly - 2 sets - 10 reps - Side Stepping with Counter Support  - 1 x daily - 5 x weekly - 2-3 sets - 1 min hold - Standing March with Counter Support  - 1 x daily - 5 x weekly - 2-3 sets - 20 reps - Sit to Stand with Arms Crossed  - 1 x daily - 5 x weekly - 2-3 sets - 10 reps - Standing Hip Abduction with Counter Support  - 1 x daily - 5 x weekly - 2-3 sets - 10 reps  ______________________________________________ Note: Objective measures were completed at Evaluation unless otherwise noted.  Vitals: 96% spO2, 97 bpm (102 bpm taken manually),  115/85 mmHg  DIAGNOSTIC FINDINGS: 11/05/23 echo TEE: Left Atrium: Left atrial size was severely dilated. A left atrial/left  atrial appendage thrombus was detected.   COGNITION: Overall cognitive status: Within functional limits for tasks assessed   SENSATION: WFL  POSTURE: rounded shoulders and forward head  LOWER EXTREMITY MMT:   MMT (in sitting) Right Eval Left Eval  Hip flexion 4+ 4+  Hip extension    Hip abduction 4+ 4+  Hip adduction 4+ 4+  Hip internal rotation    Hip external rotation    Knee flexion 4+ 4+  Knee  extension 4+ 4  Ankle dorsiflexion 4+ 4+  Ankle plantarflexion 4 4  Ankle inversion    Ankle eversion    (Blank rows = not tested)  GAIT: Gait pattern: slow gait speed, pushing walker too far forward Assistive device utilized: Walker - 2 wheeled Level of assistance: SBA  FUNCTIONAL TESTS:  5xSTS: 26.23 sec pushing off knees and audible R knee crepitus  Pre: 97% spO2, 89bpm  Post: 95% spO2, 102bpm   34M walk test: 17.53 sec with RW (1.87 ft/sec)                                                                                                                 __________________________________________________________________________________ GOALS: Goals reviewed with patient? Yes  SHORT TERM GOALS:  Target date: 12/05/2023  Patient to be independent with initial HEP. Baseline: HEP initiated Goal status: MET  LONG TERM GOALS: Target date: 12/26/2023  Patient to be independent with advanced HEP. Baseline: Not yet initiated  Goal status: IN PROGRESS, 12/31/2023  Patient to demonstrate gait speed of at least 2.0 ft/sec in order to improve access to community.  Baseline: 1.87 ft/sec 12/31/2023 Goal status: IN PROGRESS, 12/31/2023  Patient to report tolerance for grocery trip without fatigue limiting.    Baseline: unable Goal status: IN PROGRESS, 12/31/2023  6 min walk test to improve to >600 ft with device as needed Baseline: 437 ft>710 ft in 6 MWT 12/17/23 Goal status: MET 12/17/2023  Patient to demonstrate 5xSTS test in <15 sec in order to decrease risk of falls.  Baseline: 26.23 sec pushing off knees and audible R knee crepitus; 45.41 seconds with arms crossed at chest 5/13 Goal status: IN PROGRESS, 12/31/2023   ASSESSMENT:  CLINICAL IMPRESSION: Pt reports improved symptoms today w/ feeling of improved energy and reduced SOB.  Instructed in seated OKC PRE with emphasis for muscular endurance and monitoring vitals with good tolerance and HR not exceeding 100 bpm. Progressed to standing activities for strength and balance with emphasis on single limb support and narrow BOS to improve dynamic balance demands. Multisensory balance activities to facilitate postural stability and righting responses.  Vitals monitored throughout session and good tolerance with no adverse response.  Continued sessions to progress POC details to improve mobility, functional activity tolerance, and reduce risk for falls.  OBJECTIVE IMPAIRMENTS: Abnormal gait, cardiopulmonary status limiting activity, decreased activity tolerance, decreased balance, difficulty walking, decreased strength, and pain.   PLAN:  PT FREQUENCY: 2x/week  PT DURATION: other: 5 weeks, including 12/31/2023 week and recert  PLANNED  INTERVENTIONS: 97164- PT Re-evaluation, 97110-Therapeutic exercises, 97530- Therapeutic activity, 97112- Neuromuscular re-education, 97535- Self Care, 16109- Manual therapy, [phone]- Gait training, [phone]- Canalith repositioning, [phone]- Aquatic Therapy, Patient/Family education, Balance training, Stair training, Taping, Dry Needling, Vestibular training, DME instructions, Cryotherapy, and Moist heat  PLAN FOR NEXT SESSION: Review HEP (seated) and progress balance/gait/endurance while monitoring vitals, possibly using interval training , general conditioning and strengthening.   5:11 PM, 01/02/24 M. Kelly Gorge Almanza, PT, DPT Physical Therapist- [place] Office Number: [phone]

## 2024-01-03 DIAGNOSIS — Z0279 Encounter for issue of other medical certificate: Secondary | ICD-10-CM

## 2024-01-03 NOTE — Telephone Encounter (Signed)
 Concur with RN

## 2024-01-03 NOTE — Telephone Encounter (Addendum)
 Patient came by office- to see if disability form available.   RN asked  what time frame is  the disability for. Patient states she has been out of work since March 17th 2025. She states she due to return to work on Jan 02, 2024.  She states K. Chad NP initially completed the papers.  Patient is requesting to be out of work for another 2 months if possible.   RN informed patient form is not ready. RN stated to patient - DR harding would not be  the person to extend her short term disability.   Neither Dr [name] or K.West.NP ar in the office until 5/20 /25. K. Stephenie Einstein would be the one

## 2024-01-07 ENCOUNTER — Encounter: Payer: Self-pay | Admitting: Physical Therapy

## 2024-01-07 ENCOUNTER — Ambulatory Visit: Admitting: Physical Therapy

## 2024-01-07 DIAGNOSIS — R262 Difficulty in walking, not elsewhere classified: Secondary | ICD-10-CM | POA: Diagnosis not present

## 2024-01-07 DIAGNOSIS — R2689 Other abnormalities of gait and mobility: Secondary | ICD-10-CM | POA: Diagnosis not present

## 2024-01-07 DIAGNOSIS — R293 Abnormal posture: Secondary | ICD-10-CM | POA: Diagnosis not present

## 2024-01-07 DIAGNOSIS — M6281 Muscle weakness (generalized): Secondary | ICD-10-CM

## 2024-01-07 DIAGNOSIS — R29898 Other symptoms and signs involving the musculoskeletal system: Secondary | ICD-10-CM | POA: Diagnosis not present

## 2024-01-07 DIAGNOSIS — I4891 Unspecified atrial fibrillation: Secondary | ICD-10-CM | POA: Diagnosis not present

## 2024-01-07 DIAGNOSIS — R2681 Unsteadiness on feet: Secondary | ICD-10-CM | POA: Diagnosis not present

## 2024-01-07 DIAGNOSIS — I509 Heart failure, unspecified: Secondary | ICD-10-CM | POA: Diagnosis not present

## 2024-01-07 NOTE — Therapy (Signed)
 OUTPATIENT PHYSICAL THERAPY NEURO TREATMENT   Patient Name: Michelle Wilcox MRN: [id number] DOB:Nov 22, 1945, 78 y.o., female Today's Date: 01/07/2024  PCP: [name], MD REFERRING PROVIDER: Lesa Rape, MD     END OF SESSION:  PT End of Session - 01/07/24 1451     Visit Number 11    Number of Visits 18    Date for PT Re-Evaluation 02/07/24    Authorization Type BCBS Medicare    Progress Note Due on Visit 19   PN completed at visit 9   PT Start Time 1450    PT Stop Time 1531    PT Time Calculation (min) 41 min    Activity Tolerance Patient tolerated treatment well;Patient limited by fatigue    Behavior During Therapy WFL for tasks assessed/performed   Tearful at times                 Past Medical History:  Diagnosis Date   Anxiety    hx of   Asthma    at times   CHF (congestive heart failure) (HCC)    Depression    hx of   Dyspnea    Giant cell arteritis (HCC)    Gouty arthropathy    Hemolytic anemia (HCC)    Hyperlipidemia    Hypertension    Irregular heart beat    extra beat   Migraines    Polymyalgia (HCC)    Polymyalgia rheumatica (HCC)    Temporal arteritis (HCC)    Past Surgical History:  Procedure Laterality Date   BARTHOLIN GLAND CYST EXCISION     non ca   BREAST BIOPSY Right    CARDIOVERSION N/A 11/05/2023   Procedure: CARDIOVERSION;  Surgeon: Jerryl Morin, DO;  [facility]: [facility];  Service: Cardiovascular;  Laterality: N/A;   CARDIOVERSION N/A 12/19/2023   Procedure: CARDIOVERSION;  Surgeon: Jann Melody, MD;  Location: [facility];  Service: Cardiovascular;  Laterality: N/A;   EYE MUSCLE SURGERY     as a child 72 yrs old   KNEE SURGERY  2004   rt knee   LAPAROSCOPIC SPLENECTOMY N/A 06/18/2018   Procedure: LAPAROSCOPIC SPLENECTOMY ERAS PATHWAY;  Surgeon: Ayesha Lente, MD;  Location: [facility];  Service: General;  Laterality: N/A;   toncil     TONSILLECTOMY     as a child   TRANSESOPHAGEAL  ECHOCARDIOGRAM (CATH LAB) N/A 11/05/2023   Procedure: TRANSESOPHAGEAL ECHOCARDIOGRAM;  Surgeon: Jerryl Morin, DO;  [facility]: [facility];  Service: Cardiovascular;  Laterality: N/A;   TRANSESOPHAGEAL ECHOCARDIOGRAM (CATH LAB) N/A 12/19/2023   Procedure: TRANSESOPHAGEAL ECHOCARDIOGRAM;  Surgeon: Jann Melody, MD;  [facility]: [facility];  Service: Cardiovascular;  Laterality: N/A;   Patient Active Problem List   Diagnosis Date Noted   Acute on chronic congestive heart failure (HCC) 12/24/2023   Acute on chronic HFrEF (heart failure with reduced ejection fraction) (HCC) 12/21/2023   Atrial fibrillation with rapid ventricular response (HCC) 12/21/2023   Atrial fibrillation, rapid (HCC) 12/21/2023   Pericardial effusion 11/07/2023   Atrial fibrillation with RVR (HCC) 11/05/2023   Borderline abnormal TFTs 11/05/2023   Macrocytosis 11/05/2023   Atrial fibrillation (HCC) 11/04/2023   PMR (polymyalgia rheumatica) (HCC) 11/04/2023   Primary localized osteoarthrosis of multiple sites 06/15/2022   Diastolic dysfunction without heart failure 03/05/2018   DOE (dyspnea on exertion) 03/05/2018   Preop cardiovascular exam 03/05/2018   Mild reactive airways disease 08/04/2017   Volume overload 08/03/2017   Hypokalemia 08/03/2017  Essential hypertension 08/03/2017   Hyperlipidemia 08/03/2017   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   Hypersensitivity reaction 11/20/2016   Drusen (degenerative) of retina, bilateral 05/22/2016   Nuclear cataract 05/22/2016   Retinal hemorrhage of left eye 05/22/2016   Temporal arteritis (HCC) 05/22/2016   Hemolytic anemia (HCC) 03/19/2016   Breast mass, right 07/01/2012    ONSET DATE: 11/04/23  REFERRING DIAG: I48.91 (ICD-10-CM) - Atrial fibrillation with RVR (HCC)  THERAPY DIAG:  Other abnormalities of gait and mobility  Muscle weakness (generalized)  Unsteadiness on feet  Rationale for Evaluation and Treatment:  Rehabilitation  SUBJECTIVE:                                                                                                                                                                                             SUBJECTIVE STATEMENT: Saw the doctor today and was not in a-fib.  Still having balance issues.  To see the cardiologist on Friday to get the heart monitor off. Pt accompanied by: self  PERTINENT HISTORY: 12/31/2023:  Pt underwent attempt at cardioversion for a-fib on 12/19/23, which did not last >24 hrs; was hospitalized after ED visit for SOB, CHF (*New orders received for PT resume and treat).  Anxiety, asthma, CHF, depression, gouty arthropathy, anemia, HLD, HTN, migraines, polymyalgia rheumatica, temporal arteritis, R knee surgery   PAIN:  Are you having pain? Yes: NPRS scale: "no" Pain location: R knee Pain description: sore Aggravating factors: previous fall Relieving factors: knee brace  PRECAUTIONS: Fall; per Cardiac NP's recommendation: goal resting HR is <110bpm. goal heart rate is <135 bpm with exertion.  12/31/2023:  Have HR monitor (will wear for 14 days).  Monitor BP  WEIGHT BEARING RESTRICTIONS: No  FALLS: Has patient fallen in last 6 months? Yes. Number of falls 1  LIVING ENVIRONMENT: Lives with: lives with their spouse who has stage 4 stomach CA (pt reports that she is having to physically assist him "some") Lives in: Other townhouse  Stairs: 2 stories, sleeps on 1st floor in recliner  Has following equipment at home: Environmental consultant - 2 wheeled, cane  PLOF: Independent; currently getting assistance with meals   PATIENT GOALS: improve fatigue   OBJECTIVE:    TODAY'S TREATMENT: 01/07/2024 Activity Comments  Vitals:  110/76, HR 83, O2 96% Manual count of HR:  84 bpm with 1-4 skipped beats  Seated march 3 x 15 LAQ 3 x 15 3# O2 sats 96, HR 82-88 bpm  Seated hip abduction 3 x 15 Green band  Seated hamstring curls 3 x 15 Green band Vitals   Standing hamstring  curls, 10 reps   Sidestepping  R and L long counter, 4 reps No UE support, min guard/supervision  Vitals:  O2 sats 96%, HR 73-84 125/84   Standing wide BOS lateral weightshifting, stagger stance forward/back rocking In response to pt's reports that static standing (in kitchen) stiffens low back; she feels good with this, at locked rollator   Access Code: [email or web address] URL: [email or web address] Date: 01/07/2024 Prepared by: [facility] - Outpatient  Rehab - [facility]  Program Notes Seated step out and in, each leg, 3 x 10 reps  Exercises - Seated March  - 1 x daily - 7 x weekly - 3 sets - 10 reps - Seated Long Arc Quad  - 1 x daily - 7 x weekly - 3 sets - 10 reps - Seated Ankle Dorsiflexion AROM  - 1 x daily - 7 x weekly - 3 sets - 10 reps - Seated Hip Abduction with Resistance  - 1 x daily - 7 x weekly - 3 sets - 10 reps - Standing Hamstring Curl with Chair Support  - 1 x daily - 7 x weekly - 3 sets - 10-15 reps  PATIENT EDUCATION: Education details: HEP updates (new code given for MedBridge today), POC and progress with goals Person educated: Patient Education method: Explanation, Demonstration, and Handouts Education comprehension: verbalized understanding, returned demonstration, and needs further education   HOME EXERCISE PROGRAM (previously given HEP) Access Code: [email or web address] URL: [email or web address] Date: 12/05/2023 Prepared by: [facility] - Outpatient  Rehab - [facility]  Program Notes monitor your heart rate during exercise and make sure it does not go above 135 bpm  Exercises - Seated Bilateral Shoulder External Rotation with Resistance  - 1 x daily - 5 x weekly - 1 sets - 10 reps - Seated Flexion Stretch with Swiss Ball  - 1 x daily - 5 x weekly - 2 sets - 5 reps - 3-5 sec hold - Supine Lower Trunk Rotation  - 1 x daily - 5 x weekly - 2 sets - 10 reps - Supine Bridge  - 1 x daily - 5 x weekly - 2 sets - 10 reps - Side Stepping with  Counter Support  - 1 x daily - 5 x weekly - 2-3 sets - 1 min hold - Standing March with Counter Support  - 1 x daily - 5 x weekly - 2-3 sets - 20 reps - Sit to Stand with Arms Crossed  - 1 x daily - 5 x weekly - 2-3 sets - 10 reps - Standing Hip Abduction with Counter Support  - 1 x daily - 5 x weekly - 2-3 sets - 10 reps  ______________________________________________ Note: Objective measures were completed at Evaluation unless otherwise noted.  Vitals: 96% spO2, 97 bpm (102 bpm taken manually),  115/85 mmHg  DIAGNOSTIC FINDINGS: 11/05/23 echo TEE: Left Atrium: Left atrial size was severely dilated. A left atrial/left  atrial appendage thrombus was detected.   COGNITION: Overall cognitive status: Within functional limits for tasks assessed   SENSATION: WFL  POSTURE: rounded shoulders and forward head  LOWER EXTREMITY MMT:   MMT (in sitting) Right Eval Left Eval  Hip flexion 4+ 4+  Hip extension    Hip abduction 4+ 4+  Hip adduction 4+ 4+  Hip internal rotation    Hip external rotation    Knee flexion 4+ 4+  Knee extension 4+ 4  Ankle dorsiflexion 4+ 4+  Ankle plantarflexion 4 4  Ankle inversion    Ankle eversion    (Blank rows = not  tested)  GAIT: Gait pattern: slow gait speed, pushing walker too far forward Assistive device utilized: Walker - 2 wheeled Level of assistance: SBA  FUNCTIONAL TESTS:  5xSTS: 26.23 sec pushing off knees and audible R knee crepitus  Pre: 97% spO2, 89bpm  Post: 95% spO2, 102bpm   4M walk test: 17.53 sec with RW (1.87 ft/sec)                                                                                                                 __________________________________________________________________________________ GOALS: Goals reviewed with patient? Yes  SHORT TERM GOALS: Target date: 12/05/2023  Patient to be independent with initial HEP. Baseline: HEP initiated Goal status: MET  LONG TERM GOALS: Target date:  12/26/2023  Patient to be independent with advanced HEP. Baseline: Not yet initiated  Goal status: IN PROGRESS, 12/31/2023  Patient to demonstrate gait speed of at least 2.0 ft/sec in order to improve access to community.  Baseline: 1.87 ft/sec 12/31/2023 Goal status: IN PROGRESS, 12/31/2023  Patient to report tolerance for grocery trip without fatigue limiting.    Baseline: unable Goal status: IN PROGRESS, 12/31/2023  6 min walk test to improve to >600 ft with device as needed Baseline: 437 ft>710 ft in 6 MWT 12/17/23 Goal status: MET 12/17/2023  Patient to demonstrate 5xSTS test in <15 sec in order to decrease risk of falls.  Baseline: 26.23 sec pushing off knees and audible R knee crepitus; 45.41 seconds with arms crossed at chest 5/13 Goal status: IN PROGRESS, 12/31/2023   ASSESSMENT:  CLINICAL IMPRESSION: Pt presents today with no new complaints. Skilled PT session focused on continued lower extremity strengthening and some standing balance. Pt reports when she stands in one spot in her kitchen for example, for prolonged periods, she notes her low back stiffens up.  Worked on lateral weightshifting and stagger stance rocking, to help lessen stiffness in low back.  Pt seems to like this and has no complaints.  Also, upgraded HEP to add hip abduction with green band and standing hamstring curls (no resistance).  Vitals checked throughout session and remain WFL throughout. Pt will continue to benefit from skilled PT towards goals for improved functional mobility and decreased fall risk.   OBJECTIVE IMPAIRMENTS: Abnormal gait, cardiopulmonary status limiting activity, decreased activity tolerance, decreased balance, difficulty walking, decreased strength, and pain.   PLAN:  PT FREQUENCY: 2x/week  PT DURATION: other: 5 weeks, including 12/31/2023 week and recert  PLANNED INTERVENTIONS: 97164- PT Re-evaluation, 97110-Therapeutic exercises, 97530- Therapeutic activity, 97112- Neuromuscular  re-education, 97535- Self Care, 91478- Manual therapy, [phone]- Gait training, [phone]- Canalith repositioning, [phone]- Aquatic Therapy, Patient/Family education, Balance training, Stair training, Taping, Dry Needling, Vestibular training, DME instructions, Cryotherapy, and Moist heat  PLAN FOR NEXT SESSION: Review HEP updates and progress balance/gait/endurance while monitoring vitals, Exercises to decrease reliance on UE support with gait. Pt may bring in her RW for tennis balls next visit.   Dessie Flow, PT 01/07/24 3:41 PM Phone: [phone] Fax: [phone]  [facility] at [facility]  Neuro [address] [place], [place] [address] Phone # [phone] Fax # [phone]

## 2024-01-08 NOTE — Therapy (Signed)
 OUTPATIENT PHYSICAL THERAPY NEURO TREATMENT   Patient Name: Michelle Wilcox MRN: [id number] DOB:March 24, 1946, 78 y.o., female Today's Date: 01/09/2024  PCP: [name], MD REFERRING PROVIDER: Lesa Rape, MD     END OF SESSION:  PT End of Session - 01/09/24 1055     Visit Number 12    Number of Visits 18    Date for PT Re-Evaluation 02/07/24    Authorization Type BCBS Medicare    Progress Note Due on Visit 19   PN completed at visit 9   PT Start Time 1020    PT Stop Time 1059    PT Time Calculation (min) 39 min    Activity Tolerance Patient tolerated treatment well;Patient limited by fatigue    Behavior During Therapy WFL for tasks assessed/performed   Tearful at times                  Past Medical History:  Diagnosis Date   Anxiety    hx of   Asthma    at times   CHF (congestive heart failure) (HCC)    Depression    hx of   Dyspnea    Giant cell arteritis (HCC)    Gouty arthropathy    Hemolytic anemia (HCC)    Hyperlipidemia    Hypertension    Irregular heart beat    extra beat   Migraines    Polymyalgia (HCC)    Polymyalgia rheumatica (HCC)    Temporal arteritis (HCC)    Past Surgical History:  Procedure Laterality Date   BARTHOLIN GLAND CYST EXCISION     non ca   BREAST BIOPSY Right    CARDIOVERSION N/A 11/05/2023   Procedure: CARDIOVERSION;  Surgeon: Jerryl Morin, DO;  [facility]: [facility];  Service: Cardiovascular;  Laterality: N/A;   CARDIOVERSION N/A 12/19/2023   Procedure: CARDIOVERSION;  Surgeon: Jann Melody, MD;  [facility]: [facility];  Service: Cardiovascular;  Laterality: N/A;   EYE MUSCLE SURGERY     as a child 50 yrs old   KNEE SURGERY  2004   rt knee   LAPAROSCOPIC SPLENECTOMY N/A 06/18/2018   Procedure: LAPAROSCOPIC SPLENECTOMY ERAS PATHWAY;  Surgeon: Ayesha Lente, MD;  Location: [facility];  Service: General;  Laterality: N/A;   toncil     TONSILLECTOMY     as a child   TRANSESOPHAGEAL  ECHOCARDIOGRAM (CATH LAB) N/A 11/05/2023   Procedure: TRANSESOPHAGEAL ECHOCARDIOGRAM;  Surgeon: Jerryl Morin, DO;  [facility]: [facility];  Service: Cardiovascular;  Laterality: N/A;   TRANSESOPHAGEAL ECHOCARDIOGRAM (CATH LAB) N/A 12/19/2023   Procedure: TRANSESOPHAGEAL ECHOCARDIOGRAM;  Surgeon: Jann Melody, MD;  [facility]: [facility];  Service: Cardiovascular;  Laterality: N/A;   Patient Active Problem List   Diagnosis Date Noted   Acute on chronic congestive heart failure (HCC) 12/24/2023   Acute on chronic HFrEF (heart failure with reduced ejection fraction) (HCC) 12/21/2023   Atrial fibrillation with rapid ventricular response (HCC) 12/21/2023   Atrial fibrillation, rapid (HCC) 12/21/2023   Pericardial effusion 11/07/2023   Atrial fibrillation with RVR (HCC) 11/05/2023   Borderline abnormal TFTs 11/05/2023   Macrocytosis 11/05/2023   Atrial fibrillation (HCC) 11/04/2023   PMR (polymyalgia rheumatica) (HCC) 11/04/2023   Primary localized osteoarthrosis of multiple sites 06/15/2022   Diastolic dysfunction without heart failure 03/05/2018   DOE (dyspnea on exertion) 03/05/2018   Preop cardiovascular exam 03/05/2018   Mild reactive airways disease 08/04/2017   Volume overload 08/03/2017   Hypokalemia 08/03/2017  Essential hypertension 08/03/2017   Hyperlipidemia 08/03/2017   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   Hypersensitivity reaction 11/20/2016   Drusen (degenerative) of retina, bilateral 05/22/2016   Nuclear cataract 05/22/2016   Retinal hemorrhage of left eye 05/22/2016   Temporal arteritis (HCC) 05/22/2016   Hemolytic anemia (HCC) 03/19/2016   Breast mass, right 07/01/2012    ONSET DATE: 11/04/23  REFERRING DIAG: I48.91 (ICD-10-CM) - Atrial fibrillation with RVR (HCC)  THERAPY DIAG:  Other abnormalities of gait and mobility  Muscle weakness (generalized)  Unsteadiness on feet  Other symptoms and signs involving the musculoskeletal  system  Difficulty in walking, not elsewhere classified  Rationale for Evaluation and Treatment: Rehabilitation  SUBJECTIVE:                                                                                                                                                                                             SUBJECTIVE STATEMENT: Still having issues with low BP. Not supposed to take BP unless systolic is >110. Did take it this AM. Asking to put tennis balls on her walker legs.   Pt accompanied by: self  PERTINENT HISTORY: 12/31/2023:  Pt underwent attempt at cardioversion for a-fib on 12/19/23, which did not last >24 hrs; was hospitalized after ED visit for SOB, CHF (*New orders received for PT resume and treat).  Anxiety, asthma, CHF, depression, gouty arthropathy, anemia, HLD, HTN, migraines, polymyalgia rheumatica, temporal arteritis, R knee surgery   PAIN:  Are you having pain? Yes: NPRS scale: "no" Pain location: R knee Pain description: sore Aggravating factors: previous fall Relieving factors: knee brace  PRECAUTIONS: Fall; per Cardiac NP's recommendation: goal resting HR is <110bpm. goal heart rate is <135 bpm with exertion.  12/31/2023:  Have HR monitor (will wear for 14 days).  Monitor BP  WEIGHT BEARING RESTRICTIONS: No  FALLS: Has patient fallen in last 6 months? Yes. Number of falls 1  LIVING ENVIRONMENT: Lives with: lives with their spouse who has stage 4 stomach CA (pt reports that she is having to physically assist him "some") Lives in: Other townhouse  Stairs: 2 stories, sleeps on 1st floor in recliner  Has following equipment at home: Environmental consultant - 2 wheeled, cane  PLOF: Independent; currently getting assistance with meals   PATIENT GOALS: improve fatigue   OBJECTIVE:     TODAY'S TREATMENT: 01/09/24 Activity Comments  Vitals at start of session 111/72 mmHg, 97%, 72bpm   at counter, performing supersets for additional endurance challenge:   STS 2x10   Sidestepping  2x1 min Removed ankle wts d/t pt reporting ankle pain/pressure from them  HS curl 2# 2x10  each walking EC  2# 2x1 min Some instability with EC balance, a few balance checks but good ability to recover  95% spO2, 71 bpm   nustep L5 x 6 min UEs/LEs  Cueing for pacing to ensure tolerance for full time   Placed tennis balls for RW           PATIENT EDUCATION: Education details: vitals monitoring  Person educated: Patient Education method: Explanation Education comprehension: verbalized understanding   Access Code: [email or web address] URL: [email or web address] Date: 01/07/2024 Prepared by: [facility] - Outpatient  Rehab - [facility]  Program Notes Seated step out and in, each leg, 3 x 10 reps  Exercises - Seated March  - 1 x daily - 7 x weekly - 3 sets - 10 reps - Seated Long Arc Quad  - 1 x daily - 7 x weekly - 3 sets - 10 reps - Seated Ankle Dorsiflexion AROM  - 1 x daily - 7 x weekly - 3 sets - 10 reps - Seated Hip Abduction with Resistance  - 1 x daily - 7 x weekly - 3 sets - 10 reps - Standing Hamstring Curl with Chair Support  - 1 x daily - 7 x weekly - 3 sets - 10-15 reps   ______________________________________________ Note: Objective measures were completed at Evaluation unless otherwise noted.  Vitals: 96% spO2, 97 bpm (102 bpm taken manually),  115/85 mmHg  DIAGNOSTIC FINDINGS: 11/05/23 echo TEE: Left Atrium: Left atrial size was severely dilated. A left atrial/left  atrial appendage thrombus was detected.   COGNITION: Overall cognitive status: Within functional limits for tasks assessed   SENSATION: WFL  POSTURE: rounded shoulders and forward head  LOWER EXTREMITY MMT:   MMT (in sitting) Right Eval Left Eval  Hip flexion 4+ 4+  Hip extension    Hip abduction 4+ 4+  Hip adduction 4+ 4+  Hip internal rotation    Hip external rotation    Knee flexion 4+ 4+  Knee extension 4+ 4  Ankle dorsiflexion 4+ 4+  Ankle plantarflexion 4 4   Ankle inversion    Ankle eversion    (Blank rows = not tested)  GAIT: Gait pattern: slow gait speed, pushing walker too far forward Assistive device utilized: Walker - 2 wheeled Level of assistance: SBA  FUNCTIONAL TESTS:  5xSTS: 26.23 sec pushing off knees and audible R knee crepitus  Pre: 97% spO2, 89bpm  Post: 95% spO2, 102bpm   74M walk test: 17.53 sec with RW (1.87 ft/sec)                                                                                                                 __________________________________________________________________________________ GOALS: Goals reviewed with patient? Yes  SHORT TERM GOALS: Target date: 12/05/2023  Patient to be independent with initial HEP. Baseline: HEP initiated Goal status: MET  LONG TERM GOALS: Target date: 12/26/2023  Patient to be independent with advanced HEP. Baseline: Not yet initiated  Goal status: IN PROGRESS, 12/31/2023  Patient to demonstrate gait speed of  at least 2.0 ft/sec in order to improve access to community.  Baseline: 1.87 ft/sec 12/31/2023 Goal status: IN PROGRESS, 12/31/2023  Patient to report tolerance for grocery trip without fatigue limiting.    Baseline: unable Goal status: IN PROGRESS, 12/31/2023  6 min walk test to improve to >600 ft with device as needed Baseline: 437 ft>710 ft in 6 MWT 12/17/23 Goal status: MET 12/17/2023  Patient to demonstrate 5xSTS test in <15 sec in order to decrease Wilcox of falls.  Baseline: 26.23 sec pushing off knees and audible R knee crepitus; 45.41 seconds with arms crossed at chest 5/13 Goal status: IN PROGRESS, 12/31/2023   ASSESSMENT:  CLINICAL IMPRESSION: Patient arrived to session with report of remaining low BP. Vitals at start of session were [facility]. Patient performed progressive strengthening and balance activities today. Adjustments were made for comfort throughout session. Short sitting rest breaks required d/t fatigue occasionally. With these short  breaks, patient tolerated session very well.   OBJECTIVE IMPAIRMENTS: Abnormal gait, cardiopulmonary status limiting activity, decreased activity tolerance, decreased balance, difficulty walking, decreased strength, and pain.   PLAN:  PT FREQUENCY: 2x/week  PT DURATION: other: 5 weeks, including 12/31/2023 week and recert  PLANNED INTERVENTIONS: 97164- PT Re-evaluation, 97110-Therapeutic exercises, 97530- Therapeutic activity, 97112- Neuromuscular re-education, 97535- Self Care, 54098- Manual therapy, [phone]- Gait training, [phone]- Canalith repositioning, [phone]- Aquatic Therapy, Patient/Family education, Balance training, Stair training, Taping, Dry Needling, Vestibular training, DME instructions, Cryotherapy, and Moist heat  PLAN FOR NEXT SESSION: Review HEP updates and progress balance/gait/endurance while monitoring vitals, Exercises to decrease reliance on UE support with gait.    Thaddeus Filippo, PT, DPT 01/09/24 12:43 PM  [facility] at [facility] [address] [place], [address] [place], [place] [address] Phone # [phone] Fax # [phone]

## 2024-01-09 ENCOUNTER — Encounter: Payer: Self-pay | Admitting: Physical Therapy

## 2024-01-09 ENCOUNTER — Ambulatory Visit: Admitting: Physical Therapy

## 2024-01-09 DIAGNOSIS — R2681 Unsteadiness on feet: Secondary | ICD-10-CM

## 2024-01-09 DIAGNOSIS — R2689 Other abnormalities of gait and mobility: Secondary | ICD-10-CM | POA: Diagnosis not present

## 2024-01-09 DIAGNOSIS — M6281 Muscle weakness (generalized): Secondary | ICD-10-CM

## 2024-01-09 DIAGNOSIS — R293 Abnormal posture: Secondary | ICD-10-CM | POA: Diagnosis not present

## 2024-01-09 DIAGNOSIS — R29898 Other symptoms and signs involving the musculoskeletal system: Secondary | ICD-10-CM | POA: Diagnosis not present

## 2024-01-09 DIAGNOSIS — R262 Difficulty in walking, not elsewhere classified: Secondary | ICD-10-CM | POA: Diagnosis not present

## 2024-01-09 DIAGNOSIS — I509 Heart failure, unspecified: Secondary | ICD-10-CM | POA: Diagnosis not present

## 2024-01-09 NOTE — Progress Notes (Signed)
 Cardiology Office Note    Date:  01/10/2024  ID:  Ame, Heagle 10/28/1945, MRN [id number] PCP:  [name], MD  Cardiologist:  Randene Bustard, MD  Electrophysiologist:  None   Chief Complaint: Follow up for persistent afib and diastolic HF   History of Present Illness: .    Michelle Wilcox is a 78 y.o. female with visit-pertinent history of polymyalgia rheumatica, temporal arteritis, diastolic dysfunction and atrial fibrillation.  Patient presented to ED on 11/04/2023 with new onset atrial fibrillation characterized by rapid ventricular response, heart rate in the 150s.  Patient described a fluttering sensation in her chest.  Atrial fibrillation was identified following a cortisone injection in her knee.  In hospital patient reported shortness of breath, dizziness and palpitations for more than a few days, though exact duration was unclear.  She denied any recent chest pain.  Patient was planned for TEE/DCCV.  However TEE showed decreased EF of 40 to 45% and left atrial appendage thrombus so DCCV was unable to be performed.  Given decrease in EF diltiazem  was discontinued, was unable to add p.o. Lopressor  as blood pressures were soft.  Patient was loaded on digoxin .  An attempt for better rate control diltiazem  was started however this resulted in an episode of symptomatic hypotension and a rapid response with fluid bolus.  Patient was started on IV amiodarone  with goal of rate control only.  Patient was discharged on 11/07/2023 on metoprolol  succinate 50 mg twice daily, digoxin  0.125 mg daily and Eliquis .  Patient was started on amiodarone  400 mg twice daily for 5 days followed by 200 mg twice daily for 2 weeks followed by 200 mg daily thereafter.  Patient was seen in clinic on 11/19/2023.  She reported that she was doing well overall, reported that her heart rate had been well-controlled at home.  She reported some intermittent low blood pressures, specifically when she was  at PT earlier that week.  Noted that she had not been hydrating very well, noted improvement with increased hydration.  Patient was last seen in clinic on 12/12/2023, noted that she was having significant anxiety.  She reported that her heart rate at home had been very well-controlled and her blood pressures had improved.  She denied any chest pain, shortness of breath, lower extremity edema.  Patient was scheduled for TEE guided DCCV on May 1st with Dr. Paulita Boss.  TEE on 12/19/2023 indicated LVEF of 60 to 65%, RV systolic function was normal, RV size was mildly enlarged, left atrial size was severely dilated, no left atrial/left atrial appendage thrombus was detected, right atrial size was severely dilated, there was a central jet of atrial, functional MR with blunted pulmonary vein signal, mitral valve was normal in structure with moderate to severe mitral valve regurgitation, no evidence of mitral stenosis.  Atrial functional tricuspid regurgitation with 2 jets, tricuspid valve regurgitation was moderate.  There was a large central jet and a smaller, atrial jet between the non and left cusp, aortic valve regurgitation was moderate, no aortic valve stenosis was present.  Patient underwent DCCV with conversion from atrial fibrillation to normal sinus rhythm.  On 12/21/2023 patient return to the ER with palpitation and dyspnea, original ECG with atrial fibrillation.  She was started on IV amiodarone  with conversion to sinus rhythm with PACs.  Initial troponin 544, she was chest pain-free.  Echocardiogram during admission indicated LVEF of 60 to 65%, no RWMA, normal RV systolic function, moderately dilated LA, mild to moderate AI.  BNP was elevated to 193.3, chest x-ray showed pulmonary vascular congestion, progressive edema, probable small pleural effusions.  Patient was diuresed with IV Lasix .  Patient was started on spironolactone  12.5 mg daily.  It was noted that her elevated troponin was likely due to demand  ischemia however recommended outpatient PET CT. The electrophysiology team was consulted while patient was in the hospital.  It was felt that patient was now paroxysmal while on amiodarone  as there were episodes of patient being in normal sinus rhythm with PACs on her low telemetry.  Is recommended that she wear a 2-week ZIO monitor on discharge to assess A-fib burden, noted that if she was in A-fib a majority of the time then risk of amiodarone  long-term may exceed benefits and transition to rate control strategy may be necessary.  Today patient presents for follow-up.  Patient reports significant fatigue since starting on medications for rate control of atrial fibrillation.  Patient notes that this started during her first hospitalization.  She denies chest pain, lower extremity edema, orthopnea or PND.  She notes increased shortness of breath with ongoing exertion such as when she is working with physical therapy.  She notes when exerting herself she becomes easily fatigues and have to stop and take breaks, she denies any chest pain during this exertion.  Patient notes with new medication she overall does not feel well and is eager to explore other options.  She is currently wearing her cardiac monitor, notes that this will be coming off today.  She reports that her heart rates have overall been well-controlled at home, denies significant palpitations.  She has follow-up with the EP planned for the end of June she review her cardiac monitor and to determine next steps regarding her persistent atrial fibrillation.  Patient reports that she has had a 10 pound weight loss since she was last in hospital, notes fluctuations in her blood pressure she reports that they have been ranging from 112/72 to 89/56.  She denies any dizziness or lightheadedness however notes that when she stands for prolonged periods of time she has to sit down as she fears she may fall due to weakness.  She notes that she is regularly  happening to skip her spironolactone  as her blood pressure is too low to take the medication.  ROS: .   Today she denies chest pain, lower extremity edema, melena, hematuria, hemoptysis, diaphoresis, weakness, presyncope, syncope, orthopnea, and PND.  All other systems are reviewed and otherwise negative. Studies Reviewed: [name]    EKG:  EKG is ordered today, personally reviewed, demonstrating  EKG Interpretation Date/Time:  Friday Jan 10 2024 14:35:20 EDT Ventricular Rate:  83 PR Interval:    QRS Duration:  88 QT Interval:  364 QTC Calculation: 427 R Axis:   -9  Text Interpretation: Atrial fibrillation Nonspecific T wave abnormality Cannot rule out Anterior infarct , age undetermined When compared with ECG of 21-Dec-2023 04:45, Change from SR with PACs to Afib Confirmed by Nayan Proch [phone]) on 01/10/2024 6:20:23 PM   CV Studies: Cardiac studies reviewed are outlined and summarized above. Otherwise please see EMR for full report. Cardiac Studies & Procedures   ______________________________________________________________________________________________   STRESS TESTS  MYOCARDIAL PERFUSION IMAGING 03/11/2018  Narrative  The left ventricular ejection fraction is hyperdynamic (>65%).  Nuclear stress EF: 69%.  There was no ST segment deviation noted during stress.  The study is normal.  This is a low risk study.  Low risk stress nuclear study with normal perfusion and  normal left ventricular regional and global systolic function.   ECHOCARDIOGRAM  ECHOCARDIOGRAM COMPLETE 12/22/2023  Narrative ECHOCARDIOGRAM REPORT    Patient Name:   Michelle Wilcox Date of Exam: 12/22/2023 Medical Rec #:  [id number]               Height:       63.0 in Accession #:    [id number]              Weight:       184.1 lb Date of Birth:  05/13/1946               BSA:          1.867 m Patient Age:    77 years                BP:           118/56 mmHg Patient Gender: F                        HR:           71 bpm. Exam Location:  Inpatient  Procedure: 2D Echo (Both Spectral and Color Flow Doppler were utilized during procedure).  Indications:    acute diastolic chf  History:        Patient has prior history of Echocardiogram examinations, most recent 12/19/2023. Risk Factors:Hypertension and Dyslipidemia.  Sonographer:    Dione Franks RDCS Referring Phys: [id number] PETER C NISHAN  IMPRESSIONS   1. Left ventricular ejection fraction, by estimation, is 60 to 65%. The left ventricle has normal function. The left ventricle has no regional wall motion abnormalities. Left ventricular diastolic parameters are indeterminate. 2. Right ventricular systolic function is normal. The right ventricular size is normal. Tricuspid regurgitation signal is inadequate for assessing PA pressure. 3. Left atrial size was moderately dilated. 4. The mitral valve is normal in structure. Trivial mitral valve regurgitation. No evidence of mitral stenosis. 5. The aortic valve is tricuspid. Aortic valve regurgitation is mild to moderate. No aortic stenosis is present. 6. The inferior vena cava is dilated in size with >50% respiratory variability, suggesting right atrial pressure of 8 mmHg.  FINDINGS Left Ventricle: Left ventricular ejection fraction, by estimation, is 60 to 65%. The left ventricle has normal function. The left ventricle has no regional wall motion abnormalities. The left ventricular internal cavity size was normal in size. There is no left ventricular hypertrophy. Left ventricular diastolic parameters are indeterminate.  Right Ventricle: The right ventricular size is normal. Right vetricular wall thickness was not well visualized. Right ventricular systolic function is normal. Tricuspid regurgitation signal is inadequate for assessing PA pressure.  Left Atrium: Left atrial size was moderately dilated.  Right Atrium: Right atrial size was normal in size.  Pericardium: There is no  evidence of pericardial effusion.  Mitral Valve: The mitral valve is normal in structure. Trivial mitral valve regurgitation. No evidence of mitral valve stenosis.  Tricuspid Valve: The tricuspid valve is normal in structure. Tricuspid valve regurgitation is trivial. No evidence of tricuspid stenosis.  Aortic Valve: The aortic valve is tricuspid. Aortic valve regurgitation is mild to moderate. Aortic regurgitation PHT measures 385 msec. No aortic stenosis is present. Aortic valve mean gradient measures 4.4 mmHg. Aortic valve peak gradient measures 11.0 mmHg. Aortic valve area, by VTI measures 2.17 cm.  Pulmonic Valve: The pulmonic valve was not well visualized. Pulmonic valve regurgitation is not visualized. No evidence of pulmonic stenosis.  Aorta:  The aortic root and ascending aorta are structurally normal, with no evidence of dilitation.  Venous: The inferior vena cava is dilated in size with greater than 50% respiratory variability, suggesting right atrial pressure of 8 mmHg.  IAS/Shunts: The interatrial septum was not well visualized.   LEFT VENTRICLE PLAX 2D LVIDd:         5.20 cm   Diastology LVIDs:         3.50 cm   LV e' medial:    6.85 cm/s LV PW:         0.70 cm   LV E/e' medial:  11.8 LV IVS:        0.90 cm   LV e' lateral:   8.92 cm/s LVOT diam:     1.80 cm   LV E/e' lateral: 9.0 LV SV:         56 LV SV Index:   30 LVOT Area:     2.54 cm   RIGHT VENTRICLE          IVC RV Basal diam:  3.00 cm  IVC diam: 2.50 cm  LEFT ATRIUM           Index        RIGHT ATRIUM           Index LA diam:      4.60 cm 2.46 cm/m   RA Area:     16.40 cm LA Vol (A2C): 87.6 ml 46.93 ml/m  RA Volume:   40.70 ml  21.80 ml/m LA Vol (A4C): 87.3 ml 46.77 ml/m AORTIC VALVE AV Area (Vmax):    1.75 cm AV Area (Vmean):   2.00 cm AV Area (VTI):     2.17 cm AV Vmax:           165.60 cm/s AV Vmean:          93.719 cm/s AV VTI:            0.261 m AV Peak Grad:      11.0 mmHg AV Mean Grad:       4.4 mmHg LVOT Vmax:         114.00 cm/s LVOT Vmean:        73.700 cm/s LVOT VTI:          0.222 m LVOT/AV VTI ratio: 0.85 AI PHT:            385 msec  AORTA Ao Root diam: 2.90 cm Ao Asc diam:  3.30 cm  MITRAL VALVE MV Area (PHT): 3.60 cm    SHUNTS MV Decel Time: 211 msec    Systemic VTI:  0.22 m MV E velocity: 80.50 cm/s  Systemic Diam: 1.80 cm MV A velocity: 56.80 cm/s MV E/A ratio:  1.42  Armida Lander MD Electronically signed by Armida Lander MD Signature Date/Time: 12/22/2023/6:04:31 PM    Final   TEE  ECHO TEE 12/19/2023  Narrative TRANSESOPHOGEAL ECHO REPORT    Patient Name:   Michelle Wilcox Date of Exam: 12/19/2023 Medical Rec #:  [id number]               Height:       63.0 in Accession #:    [id number]              Weight:       184.0 lb Date of Birth:  19-Mar-1946               BSA:  1.866 m Patient Age:    22 years                BP:           156/89 mmHg Patient Gender: F                       HR:           102 bpm. Exam Location:  Inpatient  Procedure: Transesophageal Echo, 3D Echo, Color Doppler, Cardiac Doppler and Intracardiac Opacification Agent (Both Spectral and Color Flow Doppler were utilized during procedure).  Indications:     I48.91* Unspecified atrial fibrillation  History:         Patient has prior history of Echocardiogram examinations, most recent 11/05/2023. Arrythmias:Atrial Fibrillation; Risk Factors:Hypertension and Dyslipidemia.  Sonographer:     Sherline Distel Senior RDCS Referring Phys:  Ozzie Remmers D Tyquasia Pant Diagnosing Phys: Gloriann Larger MD  PROCEDURE: After discussion of the risks and benefits of a TEE, an informed consent was obtained from the patient. The transesophogeal probe was passed without difficulty through the esophogus of the patient. Sedation performed by different physician. The patient was monitored while under deep sedation. Anesthestetic sedation was provided intravenously by Anesthesiology: 160mg   of Propofol . The patient developed no complications during the procedure. A successful direct current cardioversion was performed at 200 joules with 1 attempt.  IMPRESSIONS   1. Left ventricular ejection fraction, by estimation, is 60 to 65%. Left ventricular ejection fraction by 3D volume is 65 %. The left ventricle has normal function. 2. Right ventricular systolic function is normal. The right ventricular size is mildly enlarged. 3. Left atrial size was severely dilated. No left atrial/left atrial appendage thrombus was detected. 4. Right atrial size was severely dilated. 5. Central jet of atrial, functional MR with blunted pulmonary vein signal. The mitral valve is normal in structure. Moderate to severe mitral valve regurgitation. No evidence of mitral stenosis. 6. Atrial functional tricuspid regurgitation- two jets. Tricuspid valve regurgitation is moderate. 7. There is a larger central jet and a smaller, commissural jet between the non and left cusp; central jet 2DVC: 0.3 cm. The aortic valve is tricuspid. Aortic valve regurgitation is moderate. No aortic stenosis is present. 8. 3D performed of the LAA and demonstrates Patent LAA- Chicken wing morphology. 23 mm maximal diameter. 9. Cannot exclude a small PFO.  FINDINGS Left Ventricle: Left ventricular ejection fraction, by estimation, is 60 to 65%. Left ventricular ejection fraction by 3D volume is 65 %. The left ventricle has normal function. Definity  contrast agent was given IV to delineate the left ventricular endocardial borders. The left ventricular internal cavity size was normal in size.  Right Ventricle: The right ventricular size is mildly enlarged. No increase in right ventricular wall thickness. Right ventricular systolic function is normal.  Left Atrium: Left atrial size was severely dilated. No left atrial/left atrial appendage thrombus was detected.  Right Atrium: Right atrial size was severely dilated.  Pericardium:  There is no evidence of pericardial effusion.  Mitral Valve: Central jet of atrial, functional MR with blunted pulmonary vein signal. The mitral valve is normal in structure. Moderate to severe mitral valve regurgitation. No evidence of mitral valve stenosis.  Tricuspid Valve: Atrial functional tricuspid regurgitation- two jets. The tricuspid valve is normal in structure. Tricuspid valve regurgitation is moderate . No evidence of tricuspid stenosis.  Aortic Valve: There is a larger central jet and a smaller, commissural jet between the non and left cusp; central  jet 2DVC: 0.3 cm. The aortic valve is tricuspid. Aortic valve regurgitation is moderate. No aortic stenosis is present.  Pulmonic Valve: The pulmonic valve was normal in structure. Pulmonic valve regurgitation is trivial. No evidence of pulmonic stenosis.  Aorta: The aortic root and ascending aorta are structurally normal, with no evidence of dilitation.  IAS/Shunts: There is right bowing of the interatrial septum, suggestive of elevated left atrial pressure. Cannot exclude a small PFO.  Additional Comments: 3D was performed not requiring image post processing on an independent workstation and was normal. Spectral Doppler performed.   3D Volume EF LV 3D EF:    Left ventricular ejection fraction by 3D volume is 65 %. LV 3D EDV:   47.36 ml LV 3D ESV:   16.50 ml  3D Volume EF: 3D EF:        65 %  Gloriann Larger MD Electronically signed by Gloriann Larger MD Signature Date/Time: 12/19/2023/12:39:33 PM    Final        ______________________________________________________________________________________________       Current Reported Medications:.    Current Meds  Medication Sig   acetaminophen  (TYLENOL ) 650 MG CR tablet Take 1,300 mg by mouth 2 (two) times daily.   amiodarone  (PACERONE ) 200 MG tablet 1 tablet (200 mg total) daily.   apixaban  (ELIQUIS ) 5 MG TABS tablet Take 1 tablet (5 mg total) by mouth 2  (two) times daily.   cholecalciferol  (VITAMIN D3) 25 MCG (1000 UNIT) tablet Take 1,000 Units by mouth at bedtime. Take one capsule by mouth at bedtime every other day.   digoxin  (LANOXIN ) 0.125 MG tablet Take 1 tablet (0.125 mg total) by mouth daily.   folic acid  (FOLVITE ) 1 MG tablet TAKE 1 TABLET(1 MG) BY MOUTH DAILY   furosemide  (LASIX ) 40 MG tablet Take 1 tablet (40 mg total) by mouth daily.   gabapentin  (NEURONTIN ) 100 MG capsule Take 200 mg by mouth 2 (two) times daily.   lactase (LACTAID) 3000 units tablet Take 9,000-15,000 Units by mouth as needed (consuming dairy products).   loratadine  (CLARITIN ) 10 MG tablet Take 10 mg by mouth daily.   metoprolol  succinate (TOPROL -XL) 50 MG 24 hr tablet Take 1 tablet (50 mg total) by mouth 2 (two) times daily. Take with or immediately following a meal.   Multiple Vitamin (MULTIVITAMIN) tablet Take 1 tablet by mouth at bedtime. One-A-Day Women's Vitamin   potassium chloride  SA (KLOR-CON  M) 20 MEQ tablet Take 1 tablet (20 mEq total) by mouth daily.   predniSONE  (DELTASONE ) 5 MG tablet Take 1 tablet (5 mg total) by mouth daily with breakfast.   Probiotic Product (ALIGN) 4 MG CAPS Take 4 mg by mouth every morning.   rosuvastatin  (CRESTOR ) 5 MG tablet Take 5 mg by mouth at bedtime. Take one tablet by mouth at bedtime every other day.   [DISCONTINUED] spironolactone  (ALDACTONE ) 25 MG tablet Take 0.5 tablets (12.5 mg total) by mouth daily.    Physical Exam:    VS:  BP 112/68 (BP Location: Left Arm, Patient Position: Sitting, Cuff Size: Normal)   Pulse 83   Ht 5\' 3"  (1.6 m)   Wt 175 lb (79.4 kg)   SpO2 96%   BMI 31.00 kg/m    Wt Readings from Last 3 Encounters:  01/10/24 175 lb (79.4 kg)  12/21/23 184 lb 1.6 oz (83.5 kg)  12/12/23 184 lb (83.5 kg)    GEN: Well nourished, well developed in no acute distress NECK: No JVD; No carotid bruits CARDIAC: Irregular RR, no murmurs,  rubs, gallops RESPIRATORY:  Clear to auscultation without rales, wheezing  or rhonchi  ABDOMEN: Soft, non-tender, non-distended EXTREMITIES:  No edema; No acute deformity     Asessement and Plan:.    Persistent atrial fibrillation/fatigue: Patient was first diagnosed with A-fib in 10/2023, started on amiodarone , digoxin  and metoprolol .  She underwent TEE guided DCCV on 5/1, TEE noted severe biatrial enlargement, moderate to severe MR, moderate TR.  Patient presented 5/2 with shortness of breath was found to back in A-fib with RVR and have evidence of hypervolemia.  She was loaded with IV amiodarone . May be difficult to maintain sinus rhythm given biatrial enlargement and valvular disease. Patient now being followed by EP, currently wearing cardiac monitor, she is to follow-up with Dr. Daneil Dunker next month. Patient notes significant fatigue since starting rate control medication, per Dr. [name] could potentially consider ablation which would likely require pacemaker placement.  Patient denies any bleeding problems on Eliquis , reports adherence with medications. Continue metoprolol  succinate 50 mg twice daily, digoxin , amiodarone  and Eliquis  5 mg twice daily.  Elevated troponin/valve disease: During recent admission patient's troponin level increased from 28>>544.  Ischemic evaluation not pursued while inpatient, felt to be related to demand ischemia from A-fib and hypervolemia, was recommended ischemic evaluation be completed in outpatient setting.  TEE on 5/1 noted moderate to severe mitral valve regurgitation (functional MR), moderate TR, moderate AI.  Echo on 12/22/2023 indicated EF 60 to 65%, trivial MR, mild to moderate aortic valve regurgitation.  Reviewed with Dr. [name], should consider proceeding with right and left heart catheterization given positive troponin while inpatient, this would also allow for evaluation of her valves and pressures. Discussed tentative plan with patient for [facility] given positive troponin, she would like to discuss in further detail on follow-up visit.   She denies any chest pain at this time.  Reviewed ED precautions.  Continue Eliquis .  Chronic diastolic heart failure: Echocardiogram on 12/23/2023 indicated LVEF 66 5%, no RWMA, normal RV systolic function, moderately dilated LA, mild to moderate AI. Patient's BNP was elevated to 193.3, chest x-ray showed pulmonary vascular congestion, progressive edema, probable small pleural effusions, she underwent IV diuresis.  Was discharged on Lasix  40 mg daily and spironolactone  12.5 mg daily.  Patient notes that her blood pressure continues to be soft, she notes increased fatigue and weakness with exertion.  Today she appears euvolemic and well compensated on exam, discussed with Dr. [name] recommended discontinuing spironolactone  as patient has regularly been holding the medication due to hypotension. Check BMET and BNP.   Hyperlipidemia: Continue Crestor  5 mg every other day.   Disposition: F/u with Macgregor Aeschliman, NP in two weeks.   Signed, Tyniya Kuyper D Kyriaki Moder, NP

## 2024-01-10 ENCOUNTER — Encounter: Payer: Self-pay | Admitting: Cardiology

## 2024-01-10 ENCOUNTER — Ambulatory Visit: Attending: Cardiology | Admitting: Cardiology

## 2024-01-10 VITALS — BP 112/68 | HR 83 | Ht 63.0 in | Wt 175.0 lb

## 2024-01-10 DIAGNOSIS — R7989 Other specified abnormal findings of blood chemistry: Secondary | ICD-10-CM

## 2024-01-10 DIAGNOSIS — R5383 Other fatigue: Secondary | ICD-10-CM | POA: Diagnosis not present

## 2024-01-10 DIAGNOSIS — I5032 Chronic diastolic (congestive) heart failure: Secondary | ICD-10-CM

## 2024-01-10 DIAGNOSIS — Z79899 Other long term (current) drug therapy: Secondary | ICD-10-CM

## 2024-01-10 DIAGNOSIS — I4819 Other persistent atrial fibrillation: Secondary | ICD-10-CM | POA: Diagnosis not present

## 2024-01-10 NOTE — Patient Instructions (Signed)
 Medication Instructions:  Stop Spirolactone *If you need a refill on your cardiac medications before your next appointment, please call your pharmacy*  Lab Work: Today we are going to draw Bmet and BNP If you have labs (blood work) drawn today and your tests are completely normal, you will receive your results only by: MyChart Message (if you have MyChart) OR A paper copy in the mail If you have any lab test that is abnormal or we need to change your treatment, we will call you to review the results.  Testing/Procedures: No testing  Follow-Up: At [facility], you and your health needs are our priority.  As part of our continuing mission to provide you with exceptional heart care, our providers are all part of one team.  This team includes your primary Cardiologist (physician) and Advanced Practice Providers or APPs (Physician Assistants and Nurse Practitioners) who all work together to provide you with the care you need, when you need it.  Your next appointment:   2 weeks  Provider:   [name], NP  We recommend signing up for the patient portal called "MyChart".  Sign up information is provided on this After Visit Summary.  MyChart is used to connect with patients for Virtual Visits (Telemedicine).  Patients are able to view lab/test results, encounter notes, upcoming appointments, etc.  Non-urgent messages can be sent to your provider as well.   To learn more about what you can do with MyChart, go to [email or web address].

## 2024-01-14 ENCOUNTER — Ambulatory Visit: Admitting: Rehabilitative and Restorative Service Providers"

## 2024-01-14 ENCOUNTER — Ambulatory Visit: Payer: Self-pay | Admitting: Cardiology

## 2024-01-14 ENCOUNTER — Encounter: Payer: Self-pay | Admitting: Rehabilitative and Restorative Service Providers"

## 2024-01-14 DIAGNOSIS — R2681 Unsteadiness on feet: Secondary | ICD-10-CM

## 2024-01-14 DIAGNOSIS — I509 Heart failure, unspecified: Secondary | ICD-10-CM | POA: Diagnosis not present

## 2024-01-14 DIAGNOSIS — R29898 Other symptoms and signs involving the musculoskeletal system: Secondary | ICD-10-CM | POA: Diagnosis not present

## 2024-01-14 DIAGNOSIS — M6281 Muscle weakness (generalized): Secondary | ICD-10-CM

## 2024-01-14 DIAGNOSIS — R262 Difficulty in walking, not elsewhere classified: Secondary | ICD-10-CM | POA: Diagnosis not present

## 2024-01-14 DIAGNOSIS — R2689 Other abnormalities of gait and mobility: Secondary | ICD-10-CM

## 2024-01-14 DIAGNOSIS — R293 Abnormal posture: Secondary | ICD-10-CM | POA: Diagnosis not present

## 2024-01-14 LAB — BASIC METABOLIC PANEL WITH GFR
BUN/Creatinine Ratio: 16 (ref 12–28)
BUN: 17 mg/dL (ref 8–27)
CO2: 23 mmol/L (ref 20–29)
Calcium: 9.5 mg/dL (ref 8.7–10.3)
Chloride: 94 mmol/L — ABNORMAL LOW (ref 96–106)
Creatinine, Ser: 1.07 mg/dL — ABNORMAL HIGH (ref 0.57–1.00)
Glucose: 77 mg/dL (ref 70–99)
Potassium: 5 mmol/L (ref 3.5–5.2)
Sodium: 138 mmol/L (ref 134–144)
eGFR: 53 mL/min/{1.73_m2} — ABNORMAL LOW (ref >59)

## 2024-01-14 LAB — BRAIN NATRIURETIC PEPTIDE: BNP: 97.1 pg/mL (ref 0.0–100.0)

## 2024-01-14 NOTE — Telephone Encounter (Signed)
 The [employer] disability form has been faxed to insurance and scanned into patient's chart. Billing notified.

## 2024-01-14 NOTE — Therapy (Signed)
 OUTPATIENT PHYSICAL THERAPY NEURO TREATMENT   Patient Name: Michelle Wilcox MRN: [id number] DOB:[birth year], 78 y.o., female Today's Date: 01/14/2024  PCP: [name], MD REFERRING PROVIDER: Lesa Rape, MD   END OF SESSION:  PT End of Session - 01/14/24 1214     Visit Number 13    Number of Visits 18    Date for PT Re-Evaluation 02/07/24    Authorization Type BCBS Medicare    Progress Note Due on Visit 19   PN completed at visit 9   PT Start Time 1214    PT Stop Time 1252    PT Time Calculation (min) 38 min    Activity Tolerance Patient tolerated treatment well;Patient limited by fatigue    Behavior During Therapy WFL for tasks assessed/performed   Tearful at times           Past Medical History:  Diagnosis Date   Anxiety    hx of   Asthma    at times   CHF (congestive heart failure) (HCC)    Depression    hx of   Dyspnea    Giant cell arteritis (HCC)    Gouty arthropathy    Hemolytic anemia (HCC)    Hyperlipidemia    Hypertension    Irregular heart beat    extra beat   Migraines    Polymyalgia (HCC)    Polymyalgia rheumatica (HCC)    Temporal arteritis (HCC)    Past Surgical History:  Procedure Laterality Date   BARTHOLIN GLAND CYST EXCISION     non ca   BREAST BIOPSY Right    CARDIOVERSION N/A 11/05/2023   Procedure: CARDIOVERSION;  Surgeon: Jerryl Morin, DO;  [facility];  Service: Cardiovascular;  Laterality: N/A;   CARDIOVERSION N/A 12/19/2023   Procedure: CARDIOVERSION;  Surgeon: Jann Melody, MD;  [facility]: [facility];  Service: Cardiovascular;  Laterality: N/A;   EYE MUSCLE SURGERY     as a child 57 yrs old   KNEE SURGERY  2004   rt knee   LAPAROSCOPIC SPLENECTOMY N/A 06/18/2018   Procedure: LAPAROSCOPIC SPLENECTOMY ERAS PATHWAY;  Surgeon: Ayesha Lente, MD;  Location: [facility];  Service: General;  Laterality: N/A;   toncil     TONSILLECTOMY     as a child   TRANSESOPHAGEAL ECHOCARDIOGRAM (CATH  LAB) N/A 11/05/2023   Procedure: TRANSESOPHAGEAL ECHOCARDIOGRAM;  Surgeon: Jerryl Morin, DO;  [facility];  Service: Cardiovascular;  Laterality: N/A;   TRANSESOPHAGEAL ECHOCARDIOGRAM (CATH LAB) N/A 12/19/2023   Procedure: TRANSESOPHAGEAL ECHOCARDIOGRAM;  Surgeon: Jann Melody, MD;  [facility];  Service: Cardiovascular;  Laterality: N/A;   Patient Active Problem List   Diagnosis Date Noted   Acute on chronic congestive heart failure (HCC) 12/24/2023   Acute on chronic HFrEF (heart failure with reduced ejection fraction) (HCC) 12/21/2023   Atrial fibrillation with rapid ventricular response (HCC) 12/21/2023   Atrial fibrillation, rapid (HCC) 12/21/2023   Pericardial effusion 11/07/2023   Atrial fibrillation with RVR (HCC) 11/05/2023   Borderline abnormal TFTs 11/05/2023   Macrocytosis 11/05/2023   Atrial fibrillation (HCC) 11/04/2023   PMR (polymyalgia rheumatica) (HCC) 11/04/2023   Primary localized osteoarthrosis of multiple sites 06/15/2022   Diastolic dysfunction without heart failure 03/05/2018   DOE (dyspnea on exertion) 03/05/2018   Preop cardiovascular exam 03/05/2018   Mild reactive airways disease 08/04/2017   Volume overload 08/03/2017   Hypokalemia 08/03/2017   Essential hypertension 08/03/2017   Hyperlipidemia 08/03/2017  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   Hypersensitivity reaction 11/20/2016   Drusen (degenerative) of retina, bilateral 05/22/2016   Nuclear cataract 05/22/2016   Retinal hemorrhage of left eye 05/22/2016   Temporal arteritis (HCC) 05/22/2016   Hemolytic anemia (HCC) 03/19/2016   Breast mass, right 07/01/2012    ONSET DATE: 11/04/23  REFERRING DIAG: I48.91 (ICD-10-CM) - Atrial fibrillation with RVR (HCC)  THERAPY DIAG:  Other abnormalities of gait and mobility  Muscle weakness (generalized)  Unsteadiness on feet  Rationale for Evaluation and Treatment: Rehabilitation  SUBJECTIVE:                                                                                                                                                                                              SUBJECTIVE STATEMENT: The patient awoke today with low blood pressure. She notes she did not take her BP medication for 2 doses because her pressure is low. She has had water, did leg exercises, and ate breakfast. Her last reading today Systolic was 91.  She also had a lower reading yesterday for Oxygen at 88%.  The fatigue is her main complaint today. She reports exhaustion during home tasks with frequent rest breaks during chores. Pt accompanied by: self PERTINENT HISTORY: 12/31/2023:  Pt underwent attempt at cardioversion for a-fib on 12/19/23, which did not last >24 hrs; was hospitalized after ED visit for SOB, CHF (*New orders received for PT resume and treat).  Anxiety, asthma, CHF, depression, gouty arthropathy, anemia, HLD, HTN, migraines, polymyalgia rheumatica, temporal arteritis, R knee surgery   PAIN:  Are you having pain? Yes: NPRS scale: "no" Pain location: R knee Pain description: sore Aggravating factors: previous fall Relieving factors: knee brace  PRECAUTIONS: Fall; per Cardiac NP's recommendation: goal resting HR is <110bpm. goal heart rate is <135 bpm with exertion.  12/31/2023:  Have HR monitor (will wear for 14 days).  Monitor BP  WEIGHT BEARING RESTRICTIONS: No  FALLS: Has patient fallen in last 6 months? Yes. Number of falls 1  LIVING ENVIRONMENT: Lives with: lives with their spouse who has stage 4 stomach CA (pt reports that she is having to physically assist him "some") Lives in: Other townhouse  Stairs: 2 stories, sleeps on 1st floor in recliner  Has following equipment at home: Walker - 2 wheeled, cane  PLOF: Independent; currently getting assistance with meals   PATIENT GOALS: improve fatigue   OBJECTIVE:   [facility] Adult PT Treatment:  DATE:  01/14/24 RESTING VITALS: 117/78, HR 80  Therapeutic Exercise: Sit<>stand x 5 reps with overhead ball reach x 2 sets Walking for exercise x 120 feet with HR 83, and spO2=97% Self paced nu-step with Ues/Les today x 4 minutes due to fatigue at self regulated pace Seated Breathing exercises emphasizing diaphragmatic breathing-- challenging-- PT provides mod cues Supine (patient couldn't tolerate standing exercises today) SLR *also noted mild dizziness in supine and needed 3 pillows (spinning sensation) Patient noted she couldn't swallow well in supine, so we transitioned to sitting and provided water Vitals=HR=81, spO2=97%, and BP=116/80 Standing Heel and toe raises x 12 repetitions with UE support  PATIENT EDUCATION: Education details: vitals monitoring  Person educated: Patient Education method: Explanation Education comprehension: verbalized understanding  Access Code: [email or web address] URL: [email or web address] Date: 01/07/2024 Prepared by: [facility] - Outpatient  Rehab - [facility]  Program Notes Seated step out and in, each leg, 3 x 10 reps  Exercises - Seated March  - 1 x daily - 7 x weekly - 3 sets - 10 reps - Seated Long Arc Quad  - 1 x daily - 7 x weekly - 3 sets - 10 reps - Seated Ankle Dorsiflexion AROM  - 1 x daily - 7 x weekly - 3 sets - 10 reps - Seated Hip Abduction with Resistance  - 1 x daily - 7 x weekly - 3 sets - 10 reps - Standing Hamstring Curl with Chair Support  - 1 x daily - 7 x weekly - 3 sets - 10-15 reps  ______________________________________________ Note: Objective measures were completed at Evaluation unless otherwise noted.  Vitals: 96% spO2, 97 bpm (102 bpm taken manually),  115/85 mmHg  DIAGNOSTIC FINDINGS: 11/05/23 echo TEE: Left Atrium: Left atrial size was severely dilated. A left atrial/left  atrial appendage thrombus was detected.   COGNITION: Overall cognitive status: Within functional limits for tasks  assessed   SENSATION: WFL  POSTURE: rounded shoulders and forward head  LOWER EXTREMITY MMT:   MMT (in sitting) Right Eval Left Eval  Hip flexion 4+ 4+  Hip extension    Hip abduction 4+ 4+  Hip adduction 4+ 4+  Hip internal rotation    Hip external rotation    Knee flexion 4+ 4+  Knee extension 4+ 4  Ankle dorsiflexion 4+ 4+  Ankle plantarflexion 4 4  Ankle inversion    Ankle eversion    (Blank rows = not tested)  GAIT: Gait pattern: slow gait speed, pushing walker too far forward Assistive device utilized: Walker - 2 wheeled Level of assistance: SBA  FUNCTIONAL TESTS:  5xSTS: 26.23 sec pushing off knees and audible R knee crepitus  Pre: 97% spO2, 89bpm  Post: 95% spO2, 102bpm   40M walk test: 17.53 sec with RW (1.87 ft/sec)                                                                                                                 __________________________________________________________________________________ GOALS: Goals reviewed with patient? Yes  SHORT TERM GOALS:  Target date: 12/05/2023  Patient to be independent with initial HEP. Baseline: HEP initiated Goal status: MET  LONG TERM GOALS: Target date: 12/26/2023  (UPDATED TO 02/07/24 ON 5/14)  Patient to be independent with advanced HEP. Baseline: Not yet initiated  Goal status: IN PROGRESS, 12/31/2023  Patient to demonstrate gait speed of at least 2.0 ft/sec in order to improve access to community.  Baseline: 1.87 ft/sec 12/31/2023 Goal status: IN PROGRESS, 12/31/2023  Patient to report tolerance for grocery trip without fatigue limiting.    Baseline: unable Goal status: IN PROGRESS, 12/31/2023  6 min walk test to improve to >600 ft with device as needed Baseline: 437 ft>710 ft in 6 MWT 12/17/23 Goal status: MET 12/17/2023  Patient to demonstrate 5xSTS test in <15 sec in order to decrease risk of falls.  Baseline: 26.23 sec pushing off knees and audible R knee crepitus; 45.41 seconds with arms  crossed at chest 5/13 Goal status: IN PROGRESS, 12/31/2023   ASSESSMENT:  CLINICAL IMPRESSION: The patient's session was modified due to lower blood pressure today with fatigue and concern that she couldn't take her BP meds. PT monitored vitals throughout session and reduced intensity of exercises today based on her status. She also got a spinning sensation with supine and reported no h/o vertigo, but symptoms stopped when she lifted her head up (suspect positional vertigo).  She sleeps in her recliner at home so unsure if present during bed mobility. PT to continue working to [employer].   OBJECTIVE IMPAIRMENTS: Abnormal gait, cardiopulmonary status limiting activity, decreased activity tolerance, decreased balance, difficulty walking, decreased strength, and pain.   PLAN:  PT FREQUENCY: 2x/week  PT DURATION: other: 5 weeks, including 12/31/2023 week and recert  PLANNED INTERVENTIONS: 97164- PT Re-evaluation, 97110-Therapeutic exercises, 97530- Therapeutic activity, 97112- Neuromuscular re-education, 97535- Self Care, 16109- Manual therapy, [phone]- Gait training, [phone]- Canalith repositioning, [phone]- Aquatic Therapy, Patient/Family education, Balance training, Stair training, Taping, Dry Needling, Vestibular training, DME instructions, Cryotherapy, and Moist heat  PLAN FOR NEXT SESSION: Review HEP updates and progress balance/gait/endurance while monitoring vitals, Exercises to decrease reliance on UE support with gait.   Sheretha Shadd, PT 01/14/24 12:14 PM  [facility] at [facility] [address] [place], [address] [place], [place] [address] Phone # [phone] Fax # [phone]

## 2024-01-15 ENCOUNTER — Telehealth: Payer: Self-pay | Admitting: Cardiology

## 2024-01-15 NOTE — Telephone Encounter (Signed)
 Called patient advised of below they verbalized understanding Reminded patient of appointment date and time

## 2024-01-15 NOTE — Telephone Encounter (Signed)
-----   Message from Katlyn D West sent at 01/14/2024  4:45 PM EDT ----- Please let Michelle Wilcox know that her kidney function is stable. Her electrolytes are overall normal. Her BNP which indicates if she is holding extra fluid is normal. Good results! Follow up as planned on 01/29/24.

## 2024-01-15 NOTE — Telephone Encounter (Signed)
*  STAT* If patient is at the pharmacy, call can be transferred to refill team.   1. Which medications need to be refilled? (please list name of each medication and dose if known) amiodarone  (PACERONE ) 200 MG tablet  potassium chloride  SA (KLOR-CON  M) 20 MEQ tablet  furosemide  (LASIX ) 40 MG tablet    2. Would you like to learn more about the convenience, safety, & potential cost savings by using the [facility]?     3. Are you open to using the [facility] (Type [facility].  ).   4. Which pharmacy/location (including street and city if local pharmacy) is medication to be sent to? [id number] - [place], [place] - [address]    5. Do they need a 30 day or 90 day supply? 90 day

## 2024-01-16 ENCOUNTER — Ambulatory Visit: Admitting: Rehabilitative and Restorative Service Providers"

## 2024-01-16 ENCOUNTER — Ambulatory Visit: Admitting: Physical Therapy

## 2024-01-16 ENCOUNTER — Encounter: Payer: Self-pay | Admitting: Physical Therapy

## 2024-01-16 DIAGNOSIS — R2689 Other abnormalities of gait and mobility: Secondary | ICD-10-CM

## 2024-01-16 DIAGNOSIS — R293 Abnormal posture: Secondary | ICD-10-CM | POA: Diagnosis not present

## 2024-01-16 DIAGNOSIS — M6281 Muscle weakness (generalized): Secondary | ICD-10-CM | POA: Diagnosis not present

## 2024-01-16 DIAGNOSIS — R2681 Unsteadiness on feet: Secondary | ICD-10-CM | POA: Diagnosis not present

## 2024-01-16 DIAGNOSIS — R29898 Other symptoms and signs involving the musculoskeletal system: Secondary | ICD-10-CM | POA: Diagnosis not present

## 2024-01-16 DIAGNOSIS — I509 Heart failure, unspecified: Secondary | ICD-10-CM | POA: Diagnosis not present

## 2024-01-16 DIAGNOSIS — R262 Difficulty in walking, not elsewhere classified: Secondary | ICD-10-CM | POA: Diagnosis not present

## 2024-01-16 MED ORDER — AMIODARONE HCL 200 MG PO TABS: 200.0000 mg | ORAL_TABLET | Freq: Every day | ORAL | 3 refills | Status: DC

## 2024-01-16 MED ORDER — FUROSEMIDE 40 MG PO TABS: 40.0000 mg | ORAL_TABLET | Freq: Every day | ORAL | 3 refills | Status: DC

## 2024-01-16 MED ORDER — POTASSIUM CHLORIDE CRYS ER 20 MEQ PO TBCR: 20.0000 meq | EXTENDED_RELEASE_TABLET | Freq: Every day | ORAL | 3 refills | Status: DC

## 2024-01-16 NOTE — Telephone Encounter (Signed)
 Pt's medications were sent to pt's pharmacy as requested. Confirmation received.

## 2024-01-16 NOTE — Therapy (Signed)
 OUTPATIENT PHYSICAL THERAPY NEURO TREATMENT   Patient Name: Michelle Wilcox MRN: [id number] DOB:October 23, 1945, 78 y.o., female Today's Date: 01/16/2024  PCP: [name], MD REFERRING PROVIDER: Lesa Rape, MD   END OF SESSION:  PT End of Session - 01/16/24 1621     Visit Number 14    Number of Visits 18    Date for PT Re-Evaluation 02/07/24    Authorization Type BCBS Medicare    Progress Note Due on Visit 19   PN completed at visit 9   PT Start Time 1620    PT Stop Time 1658    PT Time Calculation (min) 38 min    Activity Tolerance Patient tolerated treatment well;Patient limited by fatigue    Behavior During Therapy WFL for tasks assessed/performed   Tearful at times            Past Medical History:  Diagnosis Date   Anxiety    hx of   Asthma    at times   CHF (congestive heart failure) (HCC)    Depression    hx of   Dyspnea    Giant cell arteritis (HCC)    Gouty arthropathy    Hemolytic anemia (HCC)    Hyperlipidemia    Hypertension    Irregular heart beat    extra beat   Migraines    Polymyalgia (HCC)    Polymyalgia rheumatica (HCC)    Temporal arteritis (HCC)    Past Surgical History:  Procedure Laterality Date   BARTHOLIN GLAND CYST EXCISION     non ca   BREAST BIOPSY Right    CARDIOVERSION N/A 11/05/2023   Procedure: CARDIOVERSION;  Surgeon: Jerryl Morin, DO;  [facility];  Service: Cardiovascular;  Laterality: N/A;   CARDIOVERSION N/A 12/19/2023   Procedure: CARDIOVERSION;  Surgeon: Jann Melody, MD;  [facility];  Service: Cardiovascular;  Laterality: N/A;   EYE MUSCLE SURGERY     as a child 20 yrs old   KNEE SURGERY  2004   rt knee   LAPAROSCOPIC SPLENECTOMY N/A 06/18/2018   Procedure: LAPAROSCOPIC SPLENECTOMY ERAS PATHWAY;  Surgeon: Ayesha Lente, MD;  Location: [facility];  Service: General;  Laterality: N/A;   toncil     TONSILLECTOMY     as a child   TRANSESOPHAGEAL ECHOCARDIOGRAM  (CATH LAB) N/A 11/05/2023   Procedure: TRANSESOPHAGEAL ECHOCARDIOGRAM;  Surgeon: Jerryl Morin, DO;  [facility];  Service: Cardiovascular;  Laterality: N/A;   TRANSESOPHAGEAL ECHOCARDIOGRAM (CATH LAB) N/A 12/19/2023   Procedure: TRANSESOPHAGEAL ECHOCARDIOGRAM;  Surgeon: Jann Melody, MD;  [facility]: [facility];  Service: Cardiovascular;  Laterality: N/A;   Patient Active Problem List   Diagnosis Date Noted   Acute on chronic congestive heart failure (HCC) 12/24/2023   Acute on chronic HFrEF (heart failure with reduced ejection fraction) (HCC) 12/21/2023   Atrial fibrillation with rapid ventricular response (HCC) 12/21/2023   Atrial fibrillation, rapid (HCC) 12/21/2023   Pericardial effusion 11/07/2023   Atrial fibrillation with RVR (HCC) 11/05/2023   Borderline abnormal TFTs 11/05/2023   Macrocytosis 11/05/2023   Atrial fibrillation (HCC) 11/04/2023   PMR (polymyalgia rheumatica) (HCC) 11/04/2023   Primary localized osteoarthrosis of multiple sites 06/15/2022   Diastolic dysfunction without heart failure 03/05/2018   DOE (dyspnea on exertion) 03/05/2018   Preop cardiovascular exam 03/05/2018   Mild reactive airways disease 08/04/2017   Volume overload 08/03/2017   Hypokalemia 08/03/2017   Essential hypertension 08/03/2017   Hyperlipidemia  08/03/2017   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   Hypersensitivity reaction 11/20/2016   Drusen (degenerative) of retina, bilateral 05/22/2016   Nuclear cataract 05/22/2016   Retinal hemorrhage of left eye 05/22/2016   Temporal arteritis (HCC) 05/22/2016   Hemolytic anemia (HCC) 03/19/2016   Breast mass, right 07/01/2012    ONSET DATE: 11/04/23  REFERRING DIAG: I48.91 (ICD-10-CM) - Atrial fibrillation with RVR (HCC)  THERAPY DIAG:  Other abnormalities of gait and mobility  Unsteadiness on feet  Muscle weakness (generalized)  Rationale for Evaluation and Treatment: Rehabilitation  SUBJECTIVE:                                                                                                                                                                                              SUBJECTIVE STATEMENT: Sorry that I have messed up two appointments this week.  Still somewhat fatigued, and BP is still running somewhat low. Pt accompanied by: self PERTINENT HISTORY: 12/31/2023:  Pt underwent attempt at cardioversion for a-fib on 12/19/23, which did not last >24 hrs; was hospitalized after ED visit for SOB, CHF (*New orders received for PT resume and treat).  Anxiety, asthma, CHF, depression, gouty arthropathy, anemia, HLD, HTN, migraines, polymyalgia rheumatica, temporal arteritis, R knee surgery   PAIN:  Are you having pain? Yes: NPRS scale: "no" Pain location: R knee Pain description: sore Aggravating factors: previous fall Relieving factors: knee brace  PRECAUTIONS: Fall; per Cardiac NP's recommendation: goal resting HR is <110bpm. goal heart rate is <135 bpm with exertion.  12/31/2023:  Have HR monitor (will wear for 14 days).  Monitor BP  WEIGHT BEARING RESTRICTIONS: No  FALLS: Has patient fallen in last 6 months? Yes. Number of falls 1  LIVING ENVIRONMENT: Lives with: lives with their spouse who has stage 4 stomach CA (pt reports that she is having to physically assist him "some") Lives in: Other townhouse  Stairs: 2 stories, sleeps on 1st floor in recliner  Has following equipment at home: Walker - 2 wheeled, cane  PLOF: Independent; currently getting assistance with meals   PATIENT GOALS: improve fatigue   OBJECTIVE:   Resting Vitals 107/73, HR 87 bpm O2 sat 96%  [facility] Adult PT Treatment:                                                DATE: 01/16/24 Therapeutic Exercise: Walking for exercise:  with rollator 160 ft HR 100>90>89; O2 96%  Neuromuscular re-ed: Standing heel/toe raises x 10  Standing marching in place x 10  Standing side step taps x 10 Forward/Back step  taps x 10 HR 86-96 bpm, O2 sats 95-96% Performed 2 sets of the above exercises, with 4 seated rest breaks Vitals after activity:123/74, HR 83    PATIENT EDUCATION: Education details: vitals monitoring, additions to HEP Person educated: Patient Education method: Explanation Education comprehension: verbalized understanding  Access Code: [email or web address] URL: [email or web address] Date: 01/16/2024 Prepared by: [facility] - Outpatient  Rehab - [facility]  Program Notes Seated step out and in, each leg, 3 x 10 reps  Exercises - Seated March  - 1 x daily - 7 x weekly - 3 sets - 10 reps - Seated Long Arc Quad  - 1 x daily - 7 x weekly - 3 sets - 10 reps - Seated Ankle Dorsiflexion AROM  - 1 x daily - 7 x weekly - 3 sets - 10 reps - Seated Hip Abduction with Resistance  - 1 x daily - 7 x weekly - 3 sets - 10 reps - Standing Hamstring Curl with Chair Support  - 1 x daily - 7 x weekly - 3 sets - 10-15 reps - Heel Toe Raises with Counter Support  - 1 x daily - 7 x weekly - 2-3 sets - 10 reps - Standing March with Counter Support  - 1 x daily - 7 x weekly - 2-3 sets - 10 reps    ______________________________________________ Note: Objective measures were completed at Evaluation unless otherwise noted.  Vitals: 96% spO2, 97 bpm (102 bpm taken manually),  115/85 mmHg  DIAGNOSTIC FINDINGS: 11/05/23 echo TEE: Left Atrium: Left atrial size was severely dilated. A left atrial/left  atrial appendage thrombus was detected.   COGNITION: Overall cognitive status: Within functional limits for tasks assessed   SENSATION: WFL  POSTURE: rounded shoulders and forward head  LOWER EXTREMITY MMT:   MMT (in sitting) Right Eval Left Eval  Hip flexion 4+ 4+  Hip extension    Hip abduction 4+ 4+  Hip adduction 4+ 4+  Hip internal rotation    Hip external rotation    Knee flexion 4+ 4+  Knee extension 4+ 4  Ankle dorsiflexion 4+ 4+  Ankle plantarflexion 4 4  Ankle inversion     Ankle eversion    (Blank rows = not tested)  GAIT: Gait pattern: slow gait speed, pushing walker too far forward Assistive device utilized: Walker - 2 wheeled Level of assistance: SBA  FUNCTIONAL TESTS:  5xSTS: 26.23 sec pushing off knees and audible R knee crepitus  Pre: 97% spO2, 89bpm  Post: 95% spO2, 102bpm   25M walk test: 17.53 sec with RW (1.87 ft/sec)                                                                                                                 __________________________________________________________________________________ GOALS: Goals reviewed with patient? Yes  SHORT TERM GOALS: Target date: 12/05/2023  Patient to be independent with initial HEP. Baseline: HEP initiated Goal status: MET  LONG TERM  GOALS: Target date: 12/26/2023  (UPDATED TO 02/07/24 ON 5/14)  Patient to be independent with advanced HEP. Baseline: Not yet initiated  Goal status: IN PROGRESS, 12/31/2023  Patient to demonstrate gait speed of at least 2.0 ft/sec in order to improve access to community.  Baseline: 1.87 ft/sec 12/31/2023 Goal status: IN PROGRESS, 12/31/2023  Patient to report tolerance for grocery trip without fatigue limiting.    Baseline: unable Goal status: IN PROGRESS, 12/31/2023  6 min walk test to improve to >600 ft with device as needed Baseline: 437 ft>710 ft in 6 MWT 12/17/23 Goal status: MET 12/17/2023  Patient to demonstrate 5xSTS test in <15 sec in order to decrease risk of falls.  Baseline: 26.23 sec pushing off knees and audible R knee crepitus; 45.41 seconds with arms crossed at chest 5/13 Goal status: IN PROGRESS, 12/31/2023   ASSESSMENT:  CLINICAL IMPRESSION: Pt presents today and reports feeling better today than last visit.  Able to perform standing balance exercises with light BUE>1 UE support, working to decrease support throughout.  She does need several rest breaks, but vitals remain WFL throughout activity.  She is able to increase her walking  for exercise distance, using rollator, to 160 ft today prior to resting.  She will continue to benefit from skilled PT towards goals for improved functional mobility and decreased fall risk.   OBJECTIVE IMPAIRMENTS: Abnormal gait, cardiopulmonary status limiting activity, decreased activity tolerance, decreased balance, difficulty walking, decreased strength, and pain.   PLAN:  PT FREQUENCY: 2x/week  PT DURATION: other: 5 weeks, including 12/31/2023 week and recert  PLANNED INTERVENTIONS: 97164- PT Re-evaluation, 97110-Therapeutic exercises, 97530- Therapeutic activity, 97112- Neuromuscular re-education, 97535- Self Care, 16109- Manual therapy, [phone]- Gait training, [phone]- Canalith repositioning, [phone]- Aquatic Therapy, Patient/Family education, Balance training, Stair training, Taping, Dry Needling, Vestibular training, DME instructions, Cryotherapy, and Moist heat  PLAN FOR NEXT SESSION: Review HEP updates and progress balance/gait/endurance while monitoring vitals, Exercises to decrease reliance on UE support with gait.    Cyris Maalouf W., PT 01/16/24 5:25 PM  [facility] at [facility] [address] [place], [address] [place], [place] [address] Phone # [phone] Fax # [phone]

## 2024-01-16 NOTE — Therapy (Deleted)
 OUTPATIENT PHYSICAL THERAPY NEURO TREATMENT   Patient Name: Michelle Wilcox MRN: [id number] DOB:02-01-1946, 78 y.o., female Today's Date: 01/16/2024  PCP: [name], MD REFERRING PROVIDER: Lesa Rape, MD   END OF SESSION:   Past Medical History:  Diagnosis Date   Anxiety    hx of   Asthma    at times   CHF (congestive heart failure) (HCC)    Depression    hx of   Dyspnea    Giant cell arteritis (HCC)    Gouty arthropathy    Hemolytic anemia (HCC)    Hyperlipidemia    Hypertension    Irregular heart beat    extra beat   Migraines    Polymyalgia (HCC)    Polymyalgia rheumatica (HCC)    Temporal arteritis (HCC)    Past Surgical History:  Procedure Laterality Date   BARTHOLIN GLAND CYST EXCISION     non ca   BREAST BIOPSY Right    CARDIOVERSION N/A 11/05/2023   Procedure: CARDIOVERSION;  Surgeon: Jerryl Morin, DO;  [facility];  Service: Cardiovascular;  Laterality: N/A;   CARDIOVERSION N/A 12/19/2023   Procedure: CARDIOVERSION;  Surgeon: Jann Melody, MD;  [facility]: [facility];  Service: Cardiovascular;  Laterality: N/A;   EYE MUSCLE SURGERY     as a child 63 yrs old   KNEE SURGERY  2004   rt knee   LAPAROSCOPIC SPLENECTOMY N/A 06/18/2018   Procedure: LAPAROSCOPIC SPLENECTOMY ERAS PATHWAY;  Surgeon: Ayesha Lente, MD;  Location: [facility];  Service: General;  Laterality: N/A;   toncil     TONSILLECTOMY     as a child   TRANSESOPHAGEAL ECHOCARDIOGRAM (CATH LAB) N/A 11/05/2023   Procedure: TRANSESOPHAGEAL ECHOCARDIOGRAM;  Surgeon: Jerryl Morin, DO;  [facility];  Service: Cardiovascular;  Laterality: N/A;   TRANSESOPHAGEAL ECHOCARDIOGRAM (CATH LAB) N/A 12/19/2023   Procedure: TRANSESOPHAGEAL ECHOCARDIOGRAM;  Surgeon: Jann Melody, MD;  [facility];  Service: Cardiovascular;  Laterality: N/A;   Patient Active Problem List   Diagnosis Date Noted   Acute on chronic congestive  heart failure (HCC) 12/24/2023   Acute on chronic HFrEF (heart failure with reduced ejection fraction) (HCC) 12/21/2023   Atrial fibrillation with rapid ventricular response (HCC) 12/21/2023   Atrial fibrillation, rapid (HCC) 12/21/2023   Pericardial effusion 11/07/2023   Atrial fibrillation with RVR (HCC) 11/05/2023   Borderline abnormal TFTs 11/05/2023   Macrocytosis 11/05/2023   Atrial fibrillation (HCC) 11/04/2023   PMR (polymyalgia rheumatica) (HCC) 11/04/2023   Primary localized osteoarthrosis of multiple sites 06/15/2022   Diastolic dysfunction without heart failure 03/05/2018   DOE (dyspnea on exertion) 03/05/2018   Preop cardiovascular exam 03/05/2018   Mild reactive airways disease 08/04/2017   Volume overload 08/03/2017   Hypokalemia 08/03/2017   Essential hypertension 08/03/2017   Hyperlipidemia 08/03/2017   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   Hypersensitivity reaction 11/20/2016   Drusen (degenerative) of retina, bilateral 05/22/2016   Nuclear cataract 05/22/2016   Retinal hemorrhage of left eye 05/22/2016   Temporal arteritis (HCC) 05/22/2016   Hemolytic anemia (HCC) 03/19/2016   Breast mass, right 07/01/2012    ONSET DATE: 11/04/23  REFERRING DIAG: I48.91 (ICD-10-CM) - Atrial fibrillation with RVR (HCC)  THERAPY DIAG:  No diagnosis found.  Rationale for Evaluation and Treatment: Rehabilitation  SUBJECTIVE:  SUBJECTIVE STATEMENT: ***The patient awoke today with low blood pressure. She notes she did not take her BP medication for 2 doses because her pressure is low. She has had water, did leg exercises, and ate breakfast. Her last reading today Systolic was 91.  She also had a lower reading yesterday for Oxygen at 88%.  The fatigue is her main complaint today. She  reports exhaustion during home tasks with frequent rest breaks during chores. Pt accompanied by: self PERTINENT HISTORY: 12/31/2023:  Pt underwent attempt at cardioversion for a-fib on 12/19/23, which did not last >24 hrs; was hospitalized after ED visit for SOB, CHF (*New orders received for PT resume and treat).  Anxiety, asthma, CHF, depression, gouty arthropathy, anemia, HLD, HTN, migraines, polymyalgia rheumatica, temporal arteritis, R knee surgery   PAIN:  Are you having pain? Yes: NPRS scale: "no" Pain location: R knee Pain description: sore Aggravating factors: previous fall Relieving factors: knee brace  PRECAUTIONS: Fall; per Cardiac NP's recommendation: goal resting HR is <110bpm. goal heart rate is <135 bpm with exertion.  12/31/2023:  Have HR monitor (will wear for 14 days).  Monitor BP  WEIGHT BEARING RESTRICTIONS: No  FALLS: Has patient fallen in last 6 months? Yes. Number of falls 1  LIVING ENVIRONMENT: Lives with: lives with their spouse who has stage 4 stomach CA (pt reports that she is having to physically assist him "some") Lives in: Other townhouse  Stairs: 2 stories, sleeps on 1st floor in recliner  Has following equipment at home: Environmental consultant - 2 wheeled, cane  PLOF: Independent; currently getting assistance with meals   PATIENT GOALS: improve fatigue   OBJECTIVE:   [facility] Adult PT Treatment:                                                DATE: 01/16/24 Therapeutic Exercise: *** Manual Therapy: *** Neuromuscular re-ed: *** Therapeutic Activity: *** Gait: *** Modalities: *** Self Care: ***  [facility] Adult PT Treatment:                                                DATE: 01/14/24 RESTING VITALS: 117/78, HR 80  Therapeutic Exercise: Sit<>stand x 5 reps with overhead ball reach x 2 sets Walking for exercise x 120 feet with HR 83, and spO2=97% Self paced nu-step with Ues/Les today x 4 minutes due to fatigue at self regulated pace Seated Breathing exercises  emphasizing diaphragmatic breathing-- challenging-- PT provides mod cues Supine (patient couldn't tolerate standing exercises today) SLR *also noted mild dizziness in supine and needed 3 pillows (spinning sensation) Patient noted she couldn't swallow well in supine, so we transitioned to sitting and provided water Vitals=HR=81, spO2=97%, and BP=116/80 Standing Heel and toe raises x 12 repetitions with UE support  PATIENT EDUCATION: Education details: vitals monitoring  Person educated: Patient Education method: Explanation Education comprehension: verbalized understanding  HOME EXERCISE PROGRAM:  Access Code: [email or web address] URL: [email or web address] Date: 01/07/2024 Prepared by: [facility] - [facility]  Re[facility] - [facility]  Program Notes Seated step out and in, each leg, 3 x 10 reps  Exercises - Seated March  - 1 x daily - 7 x weekly - 3 sets - 10 reps - Seated Long Arc Quad  -  1 x daily - 7 x weekly - 3 sets - 10 reps - Seated Ankle Dorsiflexion AROM  - 1 x daily - 7 x weekly - 3 sets - 10 reps - Seated Hip Abduction with Resistance  - 1 x daily - 7 x weekly - 3 sets - 10 reps - Standing Hamstring Curl with Chair Support  - 1 x daily - 7 x weekly - 3 sets - 10-15 reps  ______________________________________________ Note: Objective measures were completed at Evaluation unless otherwise noted.  Vitals: 96% spO2, 97 bpm (102 bpm taken manually),  115/85 mmHg  DIAGNOSTIC FINDINGS: 11/05/23 echo TEE: Left Atrium: Left atrial size was severely dilated. A left atrial/left  atrial appendage thrombus was detected.   COGNITION: Overall cognitive status: Within functional limits for tasks assessed   SENSATION: WFL  POSTURE: rounded shoulders and forward head  LOWER EXTREMITY MMT:   MMT (in sitting) Right Eval Left Eval  Hip flexion 4+ 4+  Hip extension    Hip abduction 4+ 4+  Hip adduction 4+ 4+  Hip internal rotation    Hip external rotation    Knee flexion  4+ 4+  Knee extension 4+ 4  Ankle dorsiflexion 4+ 4+  Ankle plantarflexion 4 4  Ankle inversion    Ankle eversion    (Blank rows = not tested)  GAIT: Gait pattern: slow gait speed, pushing walker too far forward Assistive device utilized: Walker - 2 wheeled Level of assistance: SBA  FUNCTIONAL TESTS:  5xSTS: 26.23 sec pushing off knees and audible R knee crepitus  Pre: 97% spO2, 89bpm  Post: 95% spO2, 102bpm   86M walk test: 17.53 sec with RW (1.87 ft/sec)                                                                                                                 __________________________________________________________________________________ GOALS: Goals reviewed with patient? Yes  SHORT TERM GOALS: Target date: 12/05/2023  Patient to be independent with initial HEP. Baseline: HEP initiated Goal status: MET  LONG TERM GOALS: Target date: 12/26/2023  (UPDATED TO 02/07/24 ON 5/14)  Patient to be independent with advanced HEP. Baseline: Not yet initiated  Goal status: IN PROGRESS, 12/31/2023  Patient to demonstrate gait speed of at least 2.0 ft/sec in order to improve access to community.  Baseline: 1.87 ft/sec 12/31/2023 Goal status: IN PROGRESS, 12/31/2023  Patient to report tolerance for grocery trip without fatigue limiting.    Baseline: unable Goal status: IN PROGRESS, 12/31/2023  6 min walk test to improve to >600 ft with device as needed Baseline: 437 ft>710 ft in 6 MWT 12/17/23 Goal status: MET 12/17/2023  Patient to demonstrate 5xSTS test in <15 sec in order to decrease risk of falls.  Baseline: 26.23 sec pushing off knees and audible R knee crepitus; 45.41 seconds with arms crossed at chest 5/13 Goal status: IN PROGRESS, 12/31/2023   ASSESSMENT:  CLINICAL IMPRESSION: ***The patient's session was modified due to lower blood pressure today with fatigue and concern that she couldn't take  her BP meds. PT monitored vitals throughout session and reduced intensity of  exercises today based on her status. She also got a spinning sensation with supine and reported no h/o vertigo, but symptoms stopped when she lifted her head up (suspect positional vertigo).  She sleeps in her recliner at home so unsure if present during bed mobility. PT to continue working to [employer].   OBJECTIVE IMPAIRMENTS: Abnormal gait, cardiopulmonary status limiting activity, decreased activity tolerance, decreased balance, difficulty walking, decreased strength, and pain.   PLAN:  PT FREQUENCY: 2x/week  PT DURATION: other: 5 weeks, including 12/31/2023 week and recert  PLANNED INTERVENTIONS: 97164- PT Re-evaluation, 97110-Therapeutic exercises, 97530- Therapeutic activity, 97112- Neuromuscular re-education, 97535- Self Care, 16109- Manual therapy, [phone]- Gait training, [phone]- Canalith repositioning, [phone]- Aquatic Therapy, Patient/Family education, Balance training, Stair training, Taping, Dry Needling, Vestibular training, DME instructions, Cryotherapy, and Moist heat  PLAN FOR NEXT SESSION: Review HEP updates and progress balance/gait/endurance while monitoring vitals, Exercises to decrease reliance on UE support with gait.  Trygve Gage, PT 01/16/24 9:19 AM  [facility] at [facility] [address] [place], [address] [place], [place] [address] Phone # [phone] Fax # [phone]

## 2024-01-17 DIAGNOSIS — I4891 Unspecified atrial fibrillation: Secondary | ICD-10-CM | POA: Diagnosis not present

## 2024-01-20 ENCOUNTER — Telehealth: Payer: Self-pay | Admitting: Cardiology

## 2024-01-20 NOTE — Telephone Encounter (Signed)
 Patient reports short-term disability form from [employer] did not have a date, was left blank. Patient states she needs to be out of work for another 2 months.  Patient needs updated return-to work date on form, then faxed to [employer].   Informed patient [name], NP is out of the office until Friday 6/6. Patient verbalized understanding and requests callback when this has been completed.

## 2024-01-20 NOTE — Telephone Encounter (Signed)
  Patient stated that, according to [facility] there was no return-to-work date listed on her short-term disability form. When she saw [name], she mentioned that she needed to be out of work for another two months. However, since [employer] did not have a specific date, they used the date of her upcoming appointment with [name] on 01/29/24. She is requesting that Katlyn contact [employer] to provide the correct return-to-work date.

## 2024-01-20 NOTE — Therapy (Signed)
 OUTPATIENT PHYSICAL THERAPY NEURO TREATMENT   Patient Name: Michelle Wilcox MRN: [id number] DOB:08-07-1946, 78 y.o., female Today's Date: 01/21/2024  PCP: [name], MD REFERRING PROVIDER: Lesa Rape, MD   END OF SESSION:  PT End of Session - 01/21/24 1125     Visit Number 15    Number of Visits 18    Date for PT Re-Evaluation 02/07/24    Authorization Type [employer] Medicare    Progress Note Due on Visit 19   PN completed at visit 9   PT Start Time 1103    PT Stop Time 1146   pt in bathroom   PT Time Calculation (min) 43 min    Activity Tolerance Patient tolerated treatment well;Patient limited by fatigue    Behavior During Therapy WFL for tasks assessed/performed   Tearful at times             Past Medical History:  Diagnosis Date   Anxiety    hx of   Asthma    at times   CHF (congestive heart failure) (HCC)    Depression    hx of   Dyspnea    Giant cell arteritis (HCC)    Gouty arthropathy    Hemolytic anemia (HCC)    Hyperlipidemia    Hypertension    Irregular heart beat    extra beat   Migraines    Polymyalgia (HCC)    Polymyalgia rheumatica (HCC)    Temporal arteritis (HCC)    Past Surgical History:  Procedure Laterality Date   BARTHOLIN GLAND CYST EXCISION     non ca   BREAST BIOPSY Right    CARDIOVERSION N/A 11/05/2023   Procedure: CARDIOVERSION;  Surgeon: Jerryl Morin, DO;  [facility];  Service: Cardiovascular;  Laterality: N/A;   CARDIOVERSION N/A 12/19/2023   Procedure: CARDIOVERSION;  Surgeon: Jann Melody, MD;  [facility]: [facility];  Service: Cardiovascular;  Laterality: N/A;   EYE MUSCLE SURGERY     as a child 67 yrs old   KNEE SURGERY  2004   rt knee   LAPAROSCOPIC SPLENECTOMY N/A 06/18/2018   Procedure: LAPAROSCOPIC SPLENECTOMY ERAS PATHWAY;  Surgeon: Ayesha Lente, MD;  Location: [facility];  Service: General;  Laterality: N/A;   toncil     TONSILLECTOMY     as a child   TRANSESOPHAGEAL  ECHOCARDIOGRAM (CATH LAB) N/A 11/05/2023   Procedure: TRANSESOPHAGEAL ECHOCARDIOGRAM;  Surgeon: Jerryl Morin, DO;  [facility]: [facility];  Service: Cardiovascular;  Laterality: N/A;   TRANSESOPHAGEAL ECHOCARDIOGRAM (CATH LAB) N/A 12/19/2023   Procedure: TRANSESOPHAGEAL ECHOCARDIOGRAM;  Surgeon: Jann Melody, MD;  [facility];  Service: Cardiovascular;  Laterality: N/A;   Patient Active Problem List   Diagnosis Date Noted   Acute on chronic congestive heart failure (HCC) 12/24/2023   Acute on chronic HFrEF (heart failure with reduced ejection fraction) (HCC) 12/21/2023   Atrial fibrillation with rapid ventricular response (HCC) 12/21/2023   Atrial fibrillation, rapid (HCC) 12/21/2023   Pericardial effusion 11/07/2023   Atrial fibrillation with RVR (HCC) 11/05/2023   Borderline abnormal TFTs 11/05/2023   Macrocytosis 11/05/2023   Atrial fibrillation (HCC) 11/04/2023   PMR (polymyalgia rheumatica) (HCC) 11/04/2023   Primary localized osteoarthrosis of multiple sites 06/15/2022   Diastolic dysfunction without heart failure 03/05/2018   DOE (dyspnea on exertion) 03/05/2018   Preop cardiovascular exam 03/05/2018   Mild reactive airways disease 08/04/2017   Volume overload 08/03/2017   Hypokalemia 08/03/2017   Essential  hypertension 08/03/2017   Hyperlipidemia 08/03/2017   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   Hypersensitivity reaction 11/20/2016   Drusen (degenerative) of retina, bilateral 05/22/2016   Nuclear cataract 05/22/2016   Retinal hemorrhage of left eye 05/22/2016   Temporal arteritis (HCC) 05/22/2016   Hemolytic anemia (HCC) 03/19/2016   Breast mass, right 07/01/2012    ONSET DATE: 11/04/23  REFERRING DIAG: I48.91 (ICD-10-CM) - Atrial fibrillation with RVR (HCC)  THERAPY DIAG:  Other abnormalities of gait and mobility  Unsteadiness on feet  Muscle weakness (generalized)  Rationale for Evaluation and Treatment:  Rehabilitation  SUBJECTIVE:                                                                                                                                                                                             SUBJECTIVE STATEMENT: BP is good today so I have more energy. Yesterday BP was as low as 81.  Pt accompanied by: self PERTINENT HISTORY: 12/31/2023:  Pt underwent attempt at cardioversion for a-fib on 12/19/23, which did not last >24 hrs; was hospitalized after ED visit for SOB, CHF (*New orders received for PT resume and treat).  Anxiety, asthma, CHF, depression, gouty arthropathy, anemia, HLD, HTN, migraines, polymyalgia rheumatica, temporal arteritis, R knee surgery   PAIN:  Are you having pain? Yes: NPRS scale: "no" Pain location: R knee Pain description: sore Aggravating factors: previous fall Relieving factors: knee brace  PRECAUTIONS: Fall; per Cardiac NP's recommendation: goal resting HR is <110bpm. goal heart rate is <135 bpm with exertion.  12/31/2023:  Have HR monitor (will wear for 14 days).  Monitor BP  WEIGHT BEARING RESTRICTIONS: No  FALLS: Has patient fallen in last 6 months? Yes. Number of falls 1  LIVING ENVIRONMENT: Lives with: lives with their spouse who has stage 4 stomach CA (pt reports that she is having to physically assist him "some") Lives in: Other townhouse  Stairs: 2 stories, sleeps on 1st floor in recliner  Has following equipment at home: Environmental consultant - 2 wheeled, cane  PLOF: Independent; currently getting assistance with meals   PATIENT GOALS: improve fatigue   OBJECTIVE:      TODAY'S TREATMENT: 01/21/24 Activity Comments  Vitals at start of session  96%, 66bpm   nustep L5 x 6 min UEs/LEs  Maintaining ~68SPM After warmup: 94%, 86bpm   at counter, performing supersets for additional endurance challenge:   romberg EC + head turns/nods 2x30" each alt toe tap on cone 2x45" Some instability with toe taps but good ability to self-correct with  reaching/ankle/hip strategy   STS 2x10 standing toe tap on step #3 2x30" 76bpm, 96%  spO2        PATIENT EDUCATION: Education details: edu on techniques to address low BP including abdominal binder and handout on it Person educated: Patient Education method: Explanation, Demonstration, Tactile cues, Verbal cues, and Handouts Education comprehension: verbalized understanding and returned demonstration   Access Code: [email or web address] URL: [email or web address] Date: 01/16/2024 Prepared by: [facility] - Outpatient  Rehab - [facility]  Program Notes Seated step out and in, each leg, 3 x 10 reps  Exercises - Seated March  - 1 x daily - 7 x weekly - 3 sets - 10 reps - Seated Long Arc Quad  - 1 x daily - 7 x weekly - 3 sets - 10 reps - Seated Ankle Dorsiflexion AROM  - 1 x daily - 7 x weekly - 3 sets - 10 reps - Seated Hip Abduction with Resistance  - 1 x daily - 7 x weekly - 3 sets - 10 reps - Standing Hamstring Curl with Chair Support  - 1 x daily - 7 x weekly - 3 sets - 10-15 reps - Heel Toe Raises with Counter Support  - 1 x daily - 7 x weekly - 2-3 sets - 10 reps - Standing March with Counter Support  - 1 x daily - 7 x weekly - 2-3 sets - 10 reps    ______________________________________________ Note: Objective measures were completed at Evaluation unless otherwise noted.  Vitals: 96% spO2, 97 bpm (102 bpm taken manually),  115/85 mmHg  DIAGNOSTIC FINDINGS: 11/05/23 echo TEE: Left Atrium: Left atrial size was severely dilated. A left atrial/left  atrial appendage thrombus was detected.   COGNITION: Overall cognitive status: Within functional limits for tasks assessed   SENSATION: WFL  POSTURE: rounded shoulders and forward head  LOWER EXTREMITY MMT:   MMT (in sitting) Right Eval Left Eval  Hip flexion 4+ 4+  Hip extension    Hip abduction 4+ 4+  Hip adduction 4+ 4+  Hip internal rotation    Hip external rotation    Knee flexion 4+ 4+  Knee extension  4+ 4  Ankle dorsiflexion 4+ 4+  Ankle plantarflexion 4 4  Ankle inversion    Ankle eversion    (Blank rows = not tested)  GAIT: Gait pattern: slow gait speed, pushing walker too far forward Assistive device utilized: Walker - 2 wheeled Level of assistance: SBA  FUNCTIONAL TESTS:  5xSTS: 26.23 sec pushing off knees and audible R knee crepitus  Pre: 97% spO2, 89bpm  Post: 95% spO2, 102bpm   84M walk test: 17.53 sec with RW (1.87 ft/sec)                                                                                                                 __________________________________________________________________________________ GOALS: Goals reviewed with patient? Yes  SHORT TERM GOALS: Target date: 12/05/2023  Patient to be independent with initial HEP. Baseline: HEP initiated Goal status: MET  LONG TERM GOALS: Target date: 12/26/2023  (UPDATED TO 02/07/24 ON 5/14)  Patient to be independent with advanced  HEP. Baseline: Not yet initiated  Goal status: IN PROGRESS, 12/31/2023  Patient to demonstrate gait speed of at least 2.0 ft/sec in order to improve access to community.  Baseline: 1.87 ft/sec 12/31/2023 Goal status: IN PROGRESS, 12/31/2023  Patient to report tolerance for grocery trip without fatigue limiting.    Baseline: unable Goal status: IN PROGRESS, 12/31/2023  6 min walk test to improve to >600 ft with device as needed Baseline: 437 ft>710 ft in 6 MWT 12/17/23 Goal status: MET 12/17/2023  Patient to demonstrate 5xSTS test in <15 sec in order to decrease risk of falls.  Baseline: 26.23 sec pushing off knees and audible R knee crepitus; 45.41 seconds with arms crossed at chest 5/13 Goal status: IN PROGRESS, 12/31/2023   ASSESSMENT:  CLINICAL IMPRESSION: Patient arrived to session with report of feeling better as her BP "is good today." Continued with standing balance and strengthening challenges to improve standing endurance. Patient still requires sitting rest breaks  after short durations of activities. Able to wean UE support with many of today's balance activities, indicating improved stability. Vitals well-maintained throughout session. No complaints upon leaving.  OBJECTIVE IMPAIRMENTS: Abnormal gait, cardiopulmonary status limiting activity, decreased activity tolerance, decreased balance, difficulty walking, decreased strength, and pain.   PLAN:  PT FREQUENCY: 2x/week  PT DURATION: other: 5 weeks, including 12/31/2023 week and recert  PLANNED INTERVENTIONS: 97164- PT Re-evaluation, 97110-Therapeutic exercises, 97530- Therapeutic activity, 97112- Neuromuscular re-education, 97535- Self Care, 40981- Manual therapy, [phone]- Gait training, [phone]- Canalith repositioning, [phone]- Aquatic Therapy, Patient/Family education, Balance training, Stair training, Taping, Dry Needling, Vestibular training, DME instructions, Cryotherapy, and Moist heat  PLAN FOR NEXT SESSION: Review HEP updates and progress balance/gait/endurance while monitoring vitals, Exercises to decrease reliance on UE support with gait.      Thaddeus Filippo, PT, DPT 01/21/24 11:48 AM  [facility] at [facility] [address] [place], [address] [place], [place] [address] Phone # [phone] Fax # [phone]

## 2024-01-20 NOTE — Telephone Encounter (Signed)
 Updated paperwork as requested, called patient to let her know, no answer, left message on voicemail per DPR. Completed paperwork left in Embry Huss, NP box. Reuel Castle, CMA to fax tomorrow.

## 2024-01-21 ENCOUNTER — Encounter: Payer: Self-pay | Admitting: Physical Therapy

## 2024-01-21 ENCOUNTER — Ambulatory Visit: Attending: Internal Medicine | Admitting: Physical Therapy

## 2024-01-21 DIAGNOSIS — R2689 Other abnormalities of gait and mobility: Secondary | ICD-10-CM | POA: Diagnosis not present

## 2024-01-21 DIAGNOSIS — R2681 Unsteadiness on feet: Secondary | ICD-10-CM | POA: Diagnosis not present

## 2024-01-21 DIAGNOSIS — M6281 Muscle weakness (generalized): Secondary | ICD-10-CM | POA: Diagnosis not present

## 2024-01-21 DIAGNOSIS — R262 Difficulty in walking, not elsewhere classified: Secondary | ICD-10-CM | POA: Diagnosis not present

## 2024-01-21 DIAGNOSIS — R29898 Other symptoms and signs involving the musculoskeletal system: Secondary | ICD-10-CM | POA: Diagnosis not present

## 2024-01-23 ENCOUNTER — Encounter: Payer: Self-pay | Admitting: Physical Therapy

## 2024-01-23 ENCOUNTER — Ambulatory Visit: Admitting: Physical Therapy

## 2024-01-23 DIAGNOSIS — R262 Difficulty in walking, not elsewhere classified: Secondary | ICD-10-CM | POA: Diagnosis not present

## 2024-01-23 DIAGNOSIS — M6281 Muscle weakness (generalized): Secondary | ICD-10-CM | POA: Diagnosis not present

## 2024-01-23 DIAGNOSIS — R2681 Unsteadiness on feet: Secondary | ICD-10-CM | POA: Diagnosis not present

## 2024-01-23 DIAGNOSIS — R29898 Other symptoms and signs involving the musculoskeletal system: Secondary | ICD-10-CM | POA: Diagnosis not present

## 2024-01-23 DIAGNOSIS — R2689 Other abnormalities of gait and mobility: Secondary | ICD-10-CM | POA: Diagnosis not present

## 2024-01-23 NOTE — Telephone Encounter (Signed)
 Pt requested cb to advise of what was put down for return to work date

## 2024-01-23 NOTE — Therapy (Signed)
 OUTPATIENT PHYSICAL THERAPY NEURO TREATMENT   Patient Name: Michelle Wilcox MRN: [id number] DOB:02-Apr-1946, 78 y.o., female Today's Date: 01/23/2024  PCP: [name], MD REFERRING PROVIDER: Lesa Rape, MD   END OF SESSION:  PT End of Session - 01/23/24 0943     Visit Number 16    Number of Visits 18    Date for PT Re-Evaluation 02/07/24    Authorization Type [employer] Medicare    Progress Note Due on Visit 19   PN completed at visit 9   PT Start Time 0936    PT Stop Time 1014    PT Time Calculation (min) 38 min    Activity Tolerance Patient tolerated treatment well;Patient limited by fatigue    Behavior During Therapy WFL for tasks assessed/performed   Tearful at times              Past Medical History:  Diagnosis Date   Anxiety    hx of   Asthma    at times   CHF (congestive heart failure) (HCC)    Depression    hx of   Dyspnea    Giant cell arteritis (HCC)    Gouty arthropathy    Hemolytic anemia (HCC)    Hyperlipidemia    Hypertension    Irregular heart beat    extra beat   Migraines    Polymyalgia (HCC)    Polymyalgia rheumatica (HCC)    Temporal arteritis (HCC)    Past Surgical History:  Procedure Laterality Date   BARTHOLIN GLAND CYST EXCISION     non ca   BREAST BIOPSY Right    CARDIOVERSION N/A 11/05/2023   Procedure: CARDIOVERSION;  Surgeon: Jerryl Morin, DO;  Location: [facility];  Service: Cardiovascular;  Laterality: N/A;   CARDIOVERSION N/A 12/19/2023   Procedure: CARDIOVERSION;  Surgeon: Jann Melody, MD;  Location: [facility];  Service: Cardiovascular;  Laterality: N/A;   EYE MUSCLE SURGERY     as a child 62 yrs old   KNEE SURGERY  2004   rt knee   LAPAROSCOPIC SPLENECTOMY N/A 06/18/2018   Procedure: LAPAROSCOPIC SPLENECTOMY ERAS PATHWAY;  Surgeon: Ayesha Lente, MD;  Location: [facility];  Service: General;  Laterality: N/A;   toncil     TONSILLECTOMY     as a child   TRANSESOPHAGEAL ECHOCARDIOGRAM  (CATH LAB) N/A 11/05/2023   Procedure: TRANSESOPHAGEAL ECHOCARDIOGRAM;  Surgeon: Jerryl Morin, DO;  Location: [facility];  Service: Cardiovascular;  Laterality: N/A;   TRANSESOPHAGEAL ECHOCARDIOGRAM (CATH LAB) N/A 12/19/2023   Procedure: TRANSESOPHAGEAL ECHOCARDIOGRAM;  Surgeon: Jann Melody, MD;  Location: [facility];  Service: Cardiovascular;  Laterality: N/A;   Patient Active Problem List   Diagnosis Date Noted   Acute on chronic congestive heart failure (HCC) 12/24/2023   Acute on chronic HFrEF (heart failure with reduced ejection fraction) (HCC) 12/21/2023   Atrial fibrillation with rapid ventricular response (HCC) 12/21/2023   Atrial fibrillation, rapid (HCC) 12/21/2023   Pericardial effusion 11/07/2023   Atrial fibrillation with RVR (HCC) 11/05/2023   Borderline abnormal TFTs 11/05/2023   Macrocytosis 11/05/2023   Atrial fibrillation (HCC) 11/04/2023   PMR (polymyalgia rheumatica) (HCC) 11/04/2023   Primary localized osteoarthrosis of multiple sites 06/15/2022   Diastolic dysfunction without heart failure 03/05/2018   DOE (dyspnea on exertion) 03/05/2018   Preop cardiovascular exam 03/05/2018   Mild reactive airways disease 08/04/2017   Volume overload 08/03/2017   Hypokalemia 08/03/2017   Essential hypertension 08/03/2017  Hyperlipidemia 08/03/2017   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   Hypersensitivity reaction 11/20/2016   Drusen (degenerative) of retina, bilateral 05/22/2016   Nuclear cataract 05/22/2016   Retinal hemorrhage of left eye 05/22/2016   Temporal arteritis (HCC) 05/22/2016   Hemolytic anemia (HCC) 03/19/2016   Breast mass, right 07/01/2012    ONSET DATE: 11/04/23  REFERRING DIAG: I48.91 (ICD-10-CM) - Atrial fibrillation with RVR (HCC)  THERAPY DIAG:  Other abnormalities of gait and mobility  Unsteadiness on feet  Muscle weakness (generalized)  Rationale for Evaluation and Treatment: Rehabilitation  SUBJECTIVE:                                                                                                                                                                                              SUBJECTIVE STATEMENT: Doing well today.  BP was a little higher so I could take the medication. Pt accompanied by: self PERTINENT HISTORY: 12/31/2023:  Pt underwent attempt at cardioversion for a-fib on 12/19/23, which did not last >24 hrs; was hospitalized after ED visit for SOB, CHF (*New orders received for PT resume and treat).  Anxiety, asthma, CHF, depression, gouty arthropathy, anemia, HLD, HTN, migraines, polymyalgia rheumatica, temporal arteritis, R knee surgery   PAIN:  Are you having pain? Yes: NPRS scale: "no" Pain location: R knee Pain description: sore Aggravating factors: previous fall Relieving factors: knee brace  PRECAUTIONS: Fall; per Cardiac NP's recommendation: goal resting HR is <110bpm. goal heart rate is <135 bpm with exertion.  12/31/2023:  Have HR monitor (will wear for 14 days).  Monitor BP  WEIGHT BEARING RESTRICTIONS: No  FALLS: Has patient fallen in last 6 months? Yes. Number of falls 1  LIVING ENVIRONMENT: Lives with: lives with their spouse who has stage 4 stomach CA (pt reports that she is having to physically assist him "some") Lives in: Other townhouse  Stairs: 2 stories, sleeps on 1st floor in recliner  Has following equipment at home: Environmental consultant - 2 wheeled, cane  PLOF: Independent; currently getting assistance with meals   PATIENT GOALS: improve fatigue   OBJECTIVE:     TODAY'S TREATMENT: 01/23/2024 Activity Comments  Vitals 103/74, HR 93 bpm, O2 sats 96%   NuStep, Level 5, 4 extremities x 8 minutes SPM 55-65 Vitals O2 sats 97%, HR 85 bpm>76 bpm  At counter-performed supersets for balance/endurance:   Sidestepping 30" Forward/back step 30" 180 turns, 2-3 reps Performed 2 sets with seated rest breaks Vitals 93%-95%, HR 90  Sit to stand + marching 3 reps, 30 sec  3 sets with seated rest break between  Vitals O2 95%, HR 85  PATIENT EDUCATION: Education details: Continue current HEP Person educated: Patient Education method: Explanation, Demonstration, Tactile cues, Verbal cues, and Handouts Education comprehension: verbalized understanding and returned demonstration   Access Code: [email or web address] URL: [email or web address] Date: 01/16/2024 Prepared by: [facility]patient  [facility] - [facility]  Program Notes Seated step out and in, each leg, 3 x 10 reps  Exercises - Seated March  - 1 x daily - 7 x weekly - 3 sets - 10 reps - Seated Long Arc Quad  - 1 x daily - 7 x weekly - 3 sets - 10 reps - Seated Ankle Dorsiflexion AROM  - 1 x daily - 7 x weekly - 3 sets - 10 reps - Seated Hip Abduction with Resistance  - 1 x daily - 7 x weekly - 3 sets - 10 reps - Standing Hamstring Curl with Chair Support  - 1 x daily - 7 x weekly - 3 sets - 10-15 reps - Heel Toe Raises with Counter Support  - 1 x daily - 7 x weekly - 2-3 sets - 10 reps - Standing March with Counter Support  - 1 x daily - 7 x weekly - 2-3 sets - 10 reps    ______________________________________________ Note: Objective measures were completed at Evaluation unless otherwise noted.  Vitals: 96% spO2, 97 bpm (102 bpm taken manually),  115/85 mmHg  DIAGNOSTIC FINDINGS: 11/05/23 echo TEE: Left Atrium: Left atrial size was severely dilated. A left atrial/left  atrial appendage thrombus was detected.   COGNITION: Overall cognitive status: Within functional limits for tasks assessed   SENSATION: WFL  POSTURE: rounded shoulders and forward head  LOWER EXTREMITY MMT:   MMT (in sitting) Right Eval Left Eval  Hip flexion 4+ 4+  Hip extension    Hip abduction 4+ 4+  Hip adduction 4+ 4+  Hip internal rotation    Hip external rotation    Knee flexion 4+ 4+  Knee extension 4+ 4  Ankle dorsiflexion 4+ 4+  Ankle plantarflexion 4 4  Ankle inversion    Ankle  eversion    (Blank rows = not tested)  GAIT: Gait pattern: slow gait speed, pushing walker too far forward Assistive device utilized: Walker - 2 wheeled Level of assistance: SBA  FUNCTIONAL TESTS:  5xSTS: 26.23 sec pushing off knees and audible R knee crepitus  Pre: 97% spO2, 89bpm  Post: 95% spO2, 102bpm   77M walk test: 17.53 sec with RW (1.87 ft/sec)                                                                                                                 __________________________________________________________________________________ GOALS: Goals reviewed with patient? Yes  SHORT TERM GOALS: Target date: 12/05/2023  Patient to be independent with initial HEP. Baseline: HEP initiated Goal status: MET  LONG TERM GOALS: Target date: 12/26/2023  (UPDATED TO 02/07/24 ON 5/14)  Patient to be independent with advanced HEP. Baseline: Not yet initiated  Goal status: IN PROGRESS, 12/31/2023  Patient to demonstrate gait speed of at  least 2.0 ft/sec in order to improve access to community.  Baseline: 1.87 ft/sec 12/31/2023 Goal status: IN PROGRESS, 12/31/2023  Patient to report tolerance for grocery trip without fatigue limiting.    Baseline: unable Goal status: IN PROGRESS, 12/31/2023  6 min walk test to improve to >600 ft with device as needed Baseline: 437 ft>710 ft in 6 MWT 12/17/23 Goal status: MET 12/17/2023  Patient to demonstrate 5xSTS test in <15 sec in order to decrease risk of falls.  Baseline: 26.23 sec pushing off knees and audible R knee crepitus; 45.41 seconds with arms crossed at chest 5/13 Goal status: IN PROGRESS, 12/31/2023   ASSESSMENT:  CLINICAL IMPRESSION: Pt presents today with no new complaints. Skilled PT session focused on aerobic warm up and sets of balance activities. Pt needs light UE support at times and seated rest breaks.  Worked on turns and pt is most challenged by this activity, but she performs well. Pt will continue to benefit from skilled PT  towards goals for improved functional mobility and decreased fall risk.  OBJECTIVE IMPAIRMENTS: Abnormal gait, cardiopulmonary status limiting activity, decreased activity tolerance, decreased balance, difficulty walking, decreased strength, and pain.   PLAN:  PT FREQUENCY: 2x/week  PT DURATION: other: 5 weeks, including 12/31/2023 week and recert  PLANNED INTERVENTIONS: 97164- PT Re-evaluation, 97110-Therapeutic exercises, 97530- Therapeutic activity, 97112- Neuromuscular re-education, 97535- Self Care, 29562- Manual therapy, [phone]- Gait training, [phone]- Canalith repositioning, [phone]- Aquatic Therapy, Patient/Family education, Balance training, Stair training, Taping, Dry Needling, Vestibular training, DME instructions, Cryotherapy, and Moist heat  PLAN FOR NEXT SESSION: Check goals/discuss POC by end of next week; progress balance/gait/endurance while monitoring vitals, Exercises to decrease reliance on UE support with gait.      Dessie Flow, PT 01/23/24 2:51 PM Phone: [phone] Fax: [phone]   [facility] at [facility] [address] [place], [address] [place], [place] [address] Phone # [phone] Fax # [phone]

## 2024-01-26 NOTE — Progress Notes (Signed)
 Cardiology Office Note    Date:  01/31/2024  ID:  Cherry, [place] Mar 19, 1946, MRN [id number] PCP:  [name], MD  Cardiologist:  Randene Bustard, MD  Electrophysiologist:  None   Chief Complaint: Follow up for atrial fibrillation   History of Present Illness: .    Michelle Wilcox is a 78 y.o. female with visit-pertinent history of polymyalgia rheumatica, temporal arteritis, diastolic dysfunction and atrial fibrillation.  Patient presented to ED on 11/04/2023 with new onset atrial fibrillation characterized by rapid ventricular response, heart rate in the 150s.  Patient described a fluttering sensation in her chest.  Atrial fibrillation was identified following a cortisone injection in her knee.  In hospital patient reported shortness of breath, dizziness and palpitations for more than a few days, though exact duration was unclear.  She denied any recent chest pain.  Patient was planned for TEE/DCCV.  However TEE showed decreased EF of 40 to 45% and left atrial appendage thrombus so DCCV was unable to be performed.  Given decrease in EF diltiazem  was discontinued, was unable to add p.o. Lopressor  as blood pressures were soft.  Patient was loaded on digoxin .  An attempt for better rate control diltiazem  was started however this resulted in an episode of symptomatic hypotension and a rapid response with fluid bolus.  Patient was started on IV amiodarone  with goal of rate control only.  Patient was discharged on 11/07/2023 on metoprolol  succinate 50 mg twice daily, digoxin  0.125 mg daily and Eliquis .  Patient was started on amiodarone  400 mg twice daily for 5 days followed by 200 mg twice daily for 2 weeks followed by 200 mg daily thereafter.  Patient was seen in clinic on 11/19/2023.  She reported that she was doing well overall, reported that her heart rate had been well-controlled at home.  She reported some intermittent low blood pressures, specifically when she was at PT earlier  that week.  Noted that she had not been hydrating very well, noted improvement with increased hydration.  Patient was last seen in clinic on 12/12/2023, noted that she was having significant anxiety.  She reported that her heart rate at home had been very well-controlled and her blood pressures had improved.  She denied any chest pain, shortness of breath, lower extremity edema.  Patient was scheduled for TEE guided DCCV on May 1st with Dr. Paulita Boss.  TEE on 12/19/2023 indicated LVEF of 60 to 65%, RV systolic function was normal, RV size was mildly enlarged, left atrial size was severely dilated, no left atrial/left atrial appendage thrombus was detected, right atrial size was severely dilated, there was a central jet of atrial, functional MR with blunted pulmonary vein signal, mitral valve was normal in structure with moderate to severe mitral valve regurgitation, no evidence of mitral stenosis.  Atrial functional tricuspid regurgitation with 2 jets, tricuspid valve regurgitation was moderate.  There was a large central jet and a smaller, atrial jet between the non and left cusp, aortic valve regurgitation was moderate, no aortic valve stenosis was present.  Patient underwent DCCV with conversion from atrial fibrillation to normal sinus rhythm.  On 12/21/2023 patient return to the ER with palpitation and dyspnea, original ECG with atrial fibrillation.  She was started on IV amiodarone  with conversion to sinus rhythm with PACs.  Initial troponin 544, she was chest pain-free.  Echocardiogram during admission indicated LVEF of 60 to 65%, no RWMA, normal RV systolic function, moderately dilated LA, mild to moderate AI.  BNP was elevated  to 193.3, chest x-ray showed pulmonary vascular congestion, progressive edema, probable small pleural effusions.  Patient was diuresed with IV Lasix .  Patient was started on spironolactone  12.5 mg daily.  It was noted that her elevated troponin was likely due to demand ischemia  however recommended outpatient PET CT. The electrophysiology team was consulted while patient was in the hospital.  It was felt that patient was now paroxysmal while on amiodarone  as there were episodes of patient being in normal sinus rhythm with PACs on her low telemetry.  It was recommended that she wear a 2-week ZIO monitor on discharge to assess A-fib burden, noted that if she was in A-fib a majority of the time then risk of amiodarone  long-term may exceed benefits and transition to rate control strategy may be necessary.  Patient was seen in clinic on 01/10/2024, patient reported that she was fatigued easily when exerting herself and would have to stop and take breaks.  She denied any chest pain.  She reported since starting her new medication she overall simply does not feel well and is eager to explore other options.  Discussed with Dr. [name], could potentially consider ablation which would likely require pacemaker placement.  Office discussed elevated troponins during recent hospitalization, per Dr. [name] would proceed with right and left heart catheterization given elevated troponins but also allow for evaluation of her valves.  Patient spironolactone  was discontinued.  Today she presents for follow-up.  She reports that she is doing well overall.  She denies any chest pain, increased lower extremity edema, orthopnea or PND.  She continues to note fatigue and shortness of breath with ongoing exertion however feels that this has been slightly improved.  She continues to work with physical therapy, tolerates well.  She denies any palpitations or feeling of increased heart rates.  She questions if she has an allergy to furosemide , notes history of sulfa allergy and has been having red and itchy earlobes last 2 weeks.  Previously discussed with Pharm.D. who did not feel it was related to Lasix  however patient feels that this reaction started following increased dose and would like to decrease her Lasix   back to 20 mg daily.  Patient reports that she started taking Benadryl  on Sunday per recommendation from a local pharmacist, she reports that since starting on Benadryl she overall feels significantly better with improvement in her fatigue.  ROS: .   Today she denies chest pain, lower extremity edema, palpitations, melena, hematuria, hemoptysis, diaphoresis, weakness, presyncope, syncope, orthopnea, and PND.  All other systems are reviewed and otherwise negative. Studies Reviewed: .   EKG:  EKG is not ordered today.  CV Studies: Cardiac studies reviewed are outlined and summarized above. Otherwise please see EMR for full report. Cardiac Studies & Procedures   ______________________________________________________________________________________________   STRESS TESTS  MYOCARDIAL PERFUSION IMAGING 03/11/2018  Narrative  The left ventricular ejection fraction is hyperdynamic (>65%).  Nuclear stress EF: 69%.  There was no ST segment deviation noted during stress.  The study is normal.  This is a low risk study.  Low risk stress nuclear study with normal perfusion and normal left ventricular regional and global systolic function.   ECHOCARDIOGRAM  ECHOCARDIOGRAM COMPLETE 12/22/2023  Narrative ECHOCARDIOGRAM REPORT    Patient Name:   Michelle Wilcox Date of Exam: 12/22/2023 Medical Rec #:  [id number]               Height:       63 .0 in Accession #:    [id number]  Weight:       184.1 lb Date of Birth:  Jan 07, 1946               BSA:          1.867 m Patient Age:    77 years                BP:           118/56 mmHg Patient Gender: F                       HR:           71 bpm. Exam Location:  Inpatient  Procedure: 2D Echo (Both Spectral and Color Flow Doppler were utilized during procedure).  Indications:    acute diastolic chf  History:        Patient has prior history of Echocardiogram examinations, most recent 12/19/2023. Risk Factors:Hypertension and  Dyslipidemia.  Sonographer:    Dione Franks RDCS Referring Phys: [id number] PETER C NISHAN  IMPRESSIONS   1. Left ventricular ejection fraction, by estimation, is 60 to 65%. The left ventricle has normal function. The left ventricle has no regional wall motion abnormalities. Left ventricular diastolic parameters are indeterminate. 2. Right ventricular systolic function is normal. The right ventricular size is normal. Tricuspid regurgitation signal is inadequate for assessing PA pressure. 3. Left atrial size was moderately dilated. 4. The mitral valve is normal in structure. Trivial mitral valve regurgitation. No evidence of mitral stenosis. 5. The aortic valve is tricuspid. Aortic valve regurgitation is mild to moderate. No aortic stenosis is present. 6. The inferior vena cava is dilated in size with >50% respiratory variability, suggesting right atrial pressure of 8 mmHg.  FINDINGS Left Ventricle: Left ventricular ejection fraction, by estimation, is 60 to 65%. The left ventricle has normal function. The left ventricle has no regional wall motion abnormalities. The left ventricular internal cavity size was normal in size. There is no left ventricular hypertrophy. Left ventricular diastolic parameters are indeterminate.  Right Ventricle: The right ventricular size is normal. Right vetricular wall thickness was not well visualized. Right ventricular systolic function is normal. Tricuspid regurgitation signal is inadequate for assessing PA pressure.  Left Atrium: Left atrial size was moderately dilated.  Right Atrium: Right atrial size was normal in size.  Pericardium: There is no evidence of pericardial effusion.  Mitral Valve: The mitral valve is normal in structure. Trivial mitral valve regurgitation. No evidence of mitral valve stenosis.  Tricuspid Valve: The tricuspid valve is normal in structure. Tricuspid valve regurgitation is trivial. No evidence of tricuspid stenosis.  Aortic  Valve: The aortic valve is tricuspid. Aortic valve regurgitation is mild to moderate. Aortic regurgitation PHT measures 385 msec. No aortic stenosis is present. Aortic valve mean gradient measures 4.4 mmHg. Aortic valve peak gradient measures 11.0 mmHg. Aortic valve area, by VTI measures 2.17 cm.  Pulmonic Valve: The pulmonic valve was not well visualized. Pulmonic valve regurgitation is not visualized. No evidence of pulmonic stenosis.  Aorta: The aortic root and ascending aorta are structurally normal, with no evidence of dilitation.  Venous: The inferior vena cava is dilated in size with greater than 50% respiratory variability, suggesting right atrial pressure of 8 mmHg.  IAS/Shunts: The interatrial septum was not well visualized.   LEFT VENTRICLE PLAX 2D LVIDd:         5.20 cm   Diastology LVIDs:         3.50 cm   LV  e' medial:    6.85 cm/s LV PW:         0.70 cm   LV E/e' medial:  11.8 LV IVS:        0.90 cm   LV e' lateral:   8.92 cm/s LVOT diam:     1.80 cm   LV E/e' lateral: 9.0 LV SV:         56 LV SV Index:   30 LVOT Area:     2.54 cm   RIGHT VENTRICLE          IVC RV Basal diam:  3.00 cm  IVC diam: 2.50 cm  LEFT ATRIUM           Index        RIGHT ATRIUM           Index LA diam:      4.60 cm 2.46 cm/m   RA Area:     16.40 cm LA Vol (A2C): 87.6 ml 46.93 ml/m  RA Volume:   40.70 ml  21.80 ml/m LA Vol (A4C): 87.3 ml 46.77 ml/m AORTIC VALVE AV Area (Vmax):    1.75 cm AV Area (Vmean):   2.00 cm AV Area (VTI):     2.17 cm AV Vmax:           165.60 cm/s AV Vmean:          93.719 cm/s AV VTI:            0.261 m AV Peak Grad:      11.0 mmHg AV Mean Grad:      4.4 mmHg LVOT Vmax:         114.00 cm/s LVOT Vmean:        73.700 cm/s LVOT VTI:          0.222 m LVOT/AV VTI ratio: 0.85 AI PHT:            385 msec  AORTA Ao Root diam: 2.90 cm Ao Asc diam:  3.30 cm  MITRAL VALVE MV Area (PHT): 3.60 cm    SHUNTS MV Decel Time: 211 msec    Systemic VTI:  0.22  m MV E velocity: 80.50 cm/s  Systemic Diam: 1.80 cm MV A velocity: 56.80 cm/s MV E/A ratio:  1.42  Armida Lander MD Electronically signed by Armida Lander MD Signature Date/Time: 12/22/2023/6:04:31 PM    Final   TEE  ECHO TEE 12/19/2023  Narrative TRANSESOPHOGEAL ECHO REPORT    Patient Name:   Michelle Wilcox Date of Exam: 12/19/2023 Medical Rec #:  [id number]               Height:       63.0 in Accession #:    [id number]              Weight:       184.0 lb Date of Birth:  [birth year]               BSA:          1.866 m Patient Age:    77 years                BP:           156/89 mmHg Patient Gender: F                       HR:           102 bpm. Exam Location:  Inpatient  Procedure: Transesophageal Echo, 3D Echo, Color Doppler, Cardiac Doppler and Intracardiac Opacification Agent (Both Spectral and Color Flow Doppler were utilized during procedure).  Indications:     I48.91* Unspecified atrial fibrillation  History:         Patient has prior history of Echocardiogram examinations, most recent 11/05/2023. Arrythmias:Atrial Fibrillation; Risk Factors:Hypertension and Dyslipidemia.  Sonographer:     Sherline Distel Senior RDCS Referring Phys:  Zenola Dezarn D Jhada Risk Diagnosing Phys: Gloriann Larger MD  PROCEDURE: After discussion of the risks and benefits of a TEE, an informed consent was obtained from the patient. The transesophogeal probe was passed without difficulty through the esophogus of the patient. Sedation performed by different physician. The patient was monitored while under deep sedation. Anesthestetic sedation was provided intravenously by Anesthesiology: 160mg  of Propofol . The patient developed no complications during the procedure. A successful direct current cardioversion was performed at 200 joules with 1 attempt.  IMPRESSIONS   1. Left ventricular ejection fraction, by estimation, is 60 to 65%. Left ventricular ejection fraction by 3D volume is 65 %. The left  ventricle has normal function. 2. Right ventricular systolic function is normal. The right ventricular size is mildly enlarged. 3. Left atrial size was severely dilated. No left atrial/left atrial appendage thrombus was detected. 4. Right atrial size was severely dilated. 5. Central jet of atrial, functional MR with blunted pulmonary vein signal. The mitral valve is normal in structure. Moderate to severe mitral valve regurgitation. No evidence of mitral stenosis. 6. Atrial functional tricuspid regurgitation- two jets. Tricuspid valve regurgitation is moderate. 7. There is a larger central jet and a smaller, commissural jet between the non and left cusp; central jet 2DVC: 0.3 cm. The aortic valve is tricuspid. Aortic valve regurgitation is moderate. No aortic stenosis is present. 8. 3D performed of the LAA and demonstrates Patent LAA- Chicken wing morphology. 23 mm maximal diameter. 9. Cannot exclude a small PFO.  FINDINGS Left Ventricle: Left ventricular ejection fraction, by estimation, is 60 to 65%. Left ventricular ejection fraction by 3D volume is 65 %. The left ventricle has normal function. Definity  contrast agent was given IV to delineate the left ventricular endocardial borders. The left ventricular internal cavity size was normal in size.  Right Ventricle: The right ventricular size is mildly enlarged. No increase in right ventricular wall thickness. Right ventricular systolic function is normal.  Left Atrium: Left atrial size was severely dilated. No left atrial/left atrial appendage thrombus was detected.  Right Atrium: Right atrial size was severely dilated.  Pericardium: There is no evidence of pericardial effusion.  Mitral Valve: Central jet of atrial, functional MR with blunted pulmonary vein signal. The mitral valve is normal in structure. Moderate to severe mitral valve regurgitation. No evidence of mitral valve stenosis.  Tricuspid Valve: Atrial functional tricuspid  regurgitation- two jets. The tricuspid valve is normal in structure. Tricuspid valve regurgitation is moderate . No evidence of tricuspid stenosis.  Aortic Valve: There is a larger central jet and a smaller, commissural jet between the non and left cusp; central jet 2DVC: 0.3 cm. The aortic valve is tricuspid. Aortic valve regurgitation is moderate. No aortic stenosis is present.  Pulmonic Valve: The pulmonic valve was normal in structure. Pulmonic valve regurgitation is trivial. No evidence of pulmonic stenosis.  Aorta: The aortic root and ascending aorta are structurally normal, with no evidence of dilitation.  IAS/Shunts: There is right bowing of the interatrial septum, suggestive of elevated left atrial pressure. Cannot exclude a small PFO.  Additional Comments: 3D  was performed not requiring image post processing on an independent workstation and was normal. Spectral Doppler performed.   3D Volume EF LV 3D EF:    Left ventricular ejection fraction by 3D volume is 65 %. LV 3D EDV:   47.36 ml LV 3D ESV:   16.50 ml  3D Volume EF: 3D EF:        65 %  Gloriann Larger MD Electronically signed by Gloriann Larger MD Signature Date/Time: 12/19/2023/12:39:33 PM    Final        ______________________________________________________________________________________________       Current Reported Medications:.    Current Meds  Medication Sig   acetaminophen  (TYLENOL ) 650 MG CR tablet Take 1,300 mg by mouth 2 (two) times daily.   ALPRAZolam  (XANAX ) 0.25 MG tablet Take 0.25 mg by mouth 2 (two) times daily as needed for anxiety.   amiodarone  (PACERONE ) 200 MG tablet Take 1 tablet (200 mg total) by mouth daily.   apixaban  (ELIQUIS ) 5 MG TABS tablet Take 1 tablet (5 mg total) by mouth 2 (two) times daily.   cholecalciferol  (VITAMIN D3) 25 MCG (1000 UNIT) tablet Take 1,000 Units by mouth at bedtime. Take one capsule by mouth at bedtime every other day.   clindamycin (CLEOCIN)  150 MG capsule Take 600 mg by mouth See admin instructions. Take prior to Dental Procedures   diclofenac  Sodium (VOLTAREN ) 1 % GEL Apply 4 g topically as needed (Hands, knees, feet).   digoxin  (LANOXIN ) 0.125 MG tablet Take 1 tablet (0.125 mg total) by mouth daily.   diphenhydrAMINE  (BENADRYL ) 25 MG tablet Take 25-50 mg by mouth daily as needed for allergies (allergic reactions).   folic acid  (FOLVITE ) 1 MG tablet TAKE 1 TABLET(1 MG) BY MOUTH DAILY   furosemide  (LASIX ) 20 MG tablet Take 1 tablet (20 mg total) by mouth daily. Take an additional dose once a day as needed for lower extremity Edema.   gabapentin  (NEURONTIN ) 100 MG capsule Take 200 mg by mouth 2 (two) times daily.   lactase (LACTAID) 3000 units tablet Take 9,000-15,000 Units by mouth as needed (consuming dairy products).   loratadine  (CLARITIN ) 10 MG tablet Take 10 mg by mouth daily.   metoprolol  succinate (TOPROL -XL) 50 MG 24 hr tablet Take 1 tablet (50 mg total) by mouth 2 (two) times daily. Take with or immediately following a meal.   Multiple Vitamin (MULTIVITAMIN) tablet Take 1 tablet by mouth at bedtime. One-A-Day Women's Vitamin   potassium chloride  SA (KLOR-CON  M) 20 MEQ tablet Take 1 tablet (20 mEq total) by mouth daily.   predniSONE  (DELTASONE ) 5 MG tablet Take 1 tablet (5 mg total) by mouth daily with breakfast.   Probiotic Product (ALIGN) 4 MG CAPS Take 4 mg by mouth every morning.   rosuvastatin  (CRESTOR ) 5 MG tablet Take 5 mg by mouth at bedtime. Take one tablet by mouth at bedtime every other day.   VENTOLIN  HFA 108 (90 Base) MCG/ACT inhaler Take 1-2 puffs by mouth every 4 (four) hours as needed for shortness of breath.   [DISCONTINUED] furosemide  (LASIX ) 40 MG tablet Take 1 tablet (40 mg total) by mouth daily.    Physical Exam:    VS:  BP 118/72   Pulse 76   Ht 5' 3 (1.6 m)   Wt 181 lb (82.1 kg)   SpO2 93%   BMI 32.06 kg/m    Wt Readings from Last 3 Encounters:  01/29/24 181 lb (82.1 kg)  01/10/24 175 lb  (79.4 kg)  12/21/23 184 lb  1.6 oz (83.5 kg)    GEN: Well nourished, well developed in no acute distress NECK: No JVD; No carotid bruits CARDIAC: Irregular RIR, no murmurs, rubs, gallops RESPIRATORY:  Clear to auscultation without rales, wheezing or rhonchi  ABDOMEN: Soft, non-tender, non-distended EXTREMITIES:  No edema; No acute deformity     Asessement and Plan:.    Persistent atrial fibrillation/fatigue: Patient first diagnosed with A-fib in 10/2023, started on amiodarone , digoxin  and metoprolol .  She underwent TEE guided DCCV on 5/1, TEE noted severe biatrial enlargement, moderate to severe MR, moderate TR.  Patient presented 5/2 with shortness of breath was found to be back in A-fib with RVR with evidence of hypervolemia.  She was again loaded with IV amiodarone .  Noted it would likely be difficult to maintain sinus rhythm given biatrial enlargement and valvular disease.  Patient now being followed by EP, has worn cardiac monitor, results are still pending.  She is to follow with Dr. Daneil Dunker later this month.  Patient notes significant fatigue and overall feeling unwell since starting rate control medications, per Dr. [name] could potentially consider ablation which would likely require pacemaker placement.  Patient denies any bleeding problems on Eliquis , reports adherence with medications.  Continue metoprolol  succinate 50 mg twice daily, digoxin , amiodarone  and Eliquis  5 mg twice daily.  Chronic diastolic heart failure: Echocardiogram on 12/23/2023 indicated LVEF 66 5%, no RWMA, normal RV systolic function, moderately dilated LA, mild to moderate AI. Patient's BNP was elevated to 193.3, chest x-ray showed pulmonary vascular congestion, progressive edema, probable small pleural effusions, she underwent IV diuresis.  She was discharged on spironolactone  12.5 mg daily and Lasix  40 mg daily however patient on follow-up reported low blood pressures with increased fatigue and weakness.  Per Dr. [name]  discontinued spironolactone .  Today she reports what she feels is an allergic reaction to increased dose of Lasix , notes that her ears have been red and itchy as well as some itchiness along her scalp, she started on Benadryl  on Sunday and reports significant improvement how she overall feels, notes improvement in fatigue.  She denies any increased shortness of breath, lower extremity edema, orthopnea or PND.  She would like to reduce Lasix  back to 20 mg daily as she notes she tolerated this better.  She agrees to take an additional 20 mg daily for increased lower extremity edema, shortness of breath, weight gain of 2 to 3 pounds overnight or 5 pounds in a week.  Elevated troponin/valve disease:  During this admission patient's troponin level increased from 28>>544.  Ischemic evaluation was not pursued while inpatient, felt to be related to demand ischemia from A-fib and hypervolemia, recommended ischemic evaluation in outpatient setting.  TEE on 5/1 noted moderate to severe mitral valve regurgitation (functional MR), moderate TR, moderate AI.  Echo on 12/22/2023 indicated EF 60 to 65%, trivial MR, mild to moderate aortic valve regurgitation.  Previously reviewed with Dr. [name], he recommended a right and left heart catheterization given positive troponin while inpatient as this would also allow for evaluation of valves and pressures.  Had a long discussion with patient today regarding right and left cardiac catheterizations including risk and benefits as well as what is required for the procedure, she deferred for now as would like to discuss atrial fibrillation with Dr. Daneil Dunker.  Would like to discuss catheterization further following Dr. Orinda Birkenhead appointment.  Reviewed ED precautions.  Continue Eliquis .  Hyperlipidemia: Continue Crestor  5 mg every other day.    Disposition: F/u with Umaima Scholten, NP  in 4 weeks.   Signed, Jacorian Golaszewski D Kostas Marrow, NP

## 2024-01-27 NOTE — Telephone Encounter (Signed)
 Spoke with pt who reports she has recently started new cardiac medications and believes she is having an allergic reaction to something.  She complains of itchy, hard earlobes and an itchy scalp.  She states she has taken a Benadryl  along with her medications for the past 3 days and symptoms have improved.  Pt has researched her medications and states she does have a sulphur allergy and wonders if it could be Furosemide , although she has been on this medication for a long time.   Pt is also asking for a return to work date for her short term disability.  Pt is scheduled to see [name] on 01/29/24.  Pt advised provider is not in the office today but will return to the office tomorrow. Will forward to pharmacy team for review of medications and to [name],NP regarding return to work date.  Pt verbalizes understanding and thanked Charity fundraiser for the callback.

## 2024-01-27 NOTE — Therapy (Signed)
 OUTPATIENT PHYSICAL THERAPY NEURO TREATMENT   Patient Name: Michelle Wilcox MRN: [id number] DOB:[birth year], 78 y.o., female Today's Date: 01/27/2024  PCP: [name], MD REFERRING PROVIDER: Lesa Rape, MD   END OF SESSION:      Past Medical History:  Diagnosis Date   Anxiety    hx of   Asthma    at times   CHF (congestive heart failure) (HCC)    Depression    hx of   Dyspnea    Giant cell arteritis (HCC)    Gouty arthropathy    Hemolytic anemia (HCC)    Hyperlipidemia    Hypertension    Irregular heart beat    extra beat   Migraines    Polymyalgia (HCC)    Polymyalgia rheumatica (HCC)    Temporal arteritis (HCC)    Past Surgical History:  Procedure Laterality Date   BARTHOLIN GLAND CYST EXCISION     non ca   BREAST BIOPSY Right    CARDIOVERSION N/A 11/05/2023   Procedure: CARDIOVERSION;  Surgeon: Jerryl Morin, DO;  Location: [facility];  Service: Cardiovascular;  Laterality: N/A;   CARDIOVERSION N/A 12/19/2023   Procedure: CARDIOVERSION;  Surgeon: Jann Melody, MD;  Location: [facility];  Service: Cardiovascular;  Laterality: N/A;   EYE MUSCLE SURGERY     as a child 31 yrs old   KNEE SURGERY  2004   rt knee   LAPAROSCOPIC SPLENECTOMY N/A 06/18/2018   Procedure: LAPAROSCOPIC SPLENECTOMY ERAS PATHWAY;  Surgeon: Ayesha Lente, MD;  Location: [facility];  Service: General;  Laterality: N/A;   toncil     TONSILLECTOMY     as a child   TRANSESOPHAGEAL ECHOCARDIOGRAM (CATH LAB) N/A 11/05/2023   Procedure: TRANSESOPHAGEAL ECHOCARDIOGRAM;  Surgeon: Jerryl Morin, DO;  Location: [facility];  Service: Cardiovascular;  Laterality: N/A;   TRANSESOPHAGEAL ECHOCARDIOGRAM (CATH LAB) N/A 12/19/2023   Procedure: TRANSESOPHAGEAL ECHOCARDIOGRAM;  Surgeon: Jann Melody, MD;  Location: [facility];  Service: Cardiovascular;  Laterality: N/A;   Patient Active Problem List   Diagnosis Date Noted   Acute on chronic  congestive heart failure (HCC) 12/24/2023   Acute on chronic HFrEF (heart failure with reduced ejection fraction) (HCC) 12/21/2023   Atrial fibrillation with rapid ventricular response (HCC) 12/21/2023   Atrial fibrillation, rapid (HCC) 12/21/2023   Pericardial effusion 11/07/2023   Atrial fibrillation with RVR (HCC) 11/05/2023   Borderline abnormal TFTs 11/05/2023   Macrocytosis 11/05/2023   Atrial fibrillation (HCC) 11/04/2023   PMR (polymyalgia rheumatica) (HCC) 11/04/2023   Primary localized osteoarthrosis of multiple sites 06/15/2022   Diastolic dysfunction without heart failure 03/05/2018   DOE (dyspnea on exertion) 03/05/2018   Preop cardiovascular exam 03/05/2018   Mild reactive airways disease 08/04/2017   Volume overload 08/03/2017   Hypokalemia 08/03/2017   Essential hypertension 08/03/2017   Hyperlipidemia 08/03/2017   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   Hypersensitivity reaction 11/20/2016   Drusen (degenerative) of retina, bilateral 05/22/2016   Nuclear cataract 05/22/2016   Retinal hemorrhage of left eye 05/22/2016   Temporal arteritis (HCC) 05/22/2016   Hemolytic anemia (HCC) 03/19/2016   Breast mass, right 07/01/2012    ONSET DATE: 11/04/23  REFERRING DIAG: I48.91 (ICD-10-CM) - Atrial fibrillation with RVR (HCC)  THERAPY DIAG:  No diagnosis found.  Rationale for Evaluation and Treatment: Rehabilitation  SUBJECTIVE:  SUBJECTIVE STATEMENT: Doing well today.  BP was a little higher so I could take the medication. Pt accompanied by: self PERTINENT HISTORY: 12/31/2023:  Pt underwent attempt at cardioversion for a-fib on 12/19/23, which did not last >24 hrs; was hospitalized after ED visit for SOB, CHF (*New orders received for PT resume and treat).  Anxiety, asthma, CHF,  depression, gouty arthropathy, anemia, HLD, HTN, migraines, polymyalgia rheumatica, temporal arteritis, R knee surgery   PAIN:  Are you having pain? Yes: NPRS scale: "no" Pain location: R knee Pain description: sore Aggravating factors: previous fall Relieving factors: knee brace  PRECAUTIONS: Fall; per Cardiac NP's recommendation: goal resting HR is <110bpm. goal heart rate is <135 bpm with exertion.  12/31/2023:  Have HR monitor (will wear for 14 days).  Monitor BP  WEIGHT BEARING RESTRICTIONS: No  FALLS: Has patient fallen in last 6 months? Yes. Number of falls 1  LIVING ENVIRONMENT: Lives with: lives with their spouse who has stage 4 stomach CA (pt reports that she is having to physically assist him "some") Lives in: Other townhouse  Stairs: 2 stories, sleeps on 1st floor in recliner  Has following equipment at home: Walker - 2 wheeled, cane  PLOF: Independent; currently getting assistance with meals   PATIENT GOALS: improve fatigue   OBJECTIVE:    TODAY'S TREATMENT: 01/28/24 Activity Comments                         Access Code: [email or web address] URL: [email or web address] Date: 01/16/2024 Prepared by: [facility] - Outpatient  Rehab - [facility]  Program Notes Seated step out and in, each leg, 3 x 10 reps  Exercises - Seated March  - 1 x daily - 7 x weekly - 3 sets - 10 reps - Seated Long Arc Quad  - 1 x daily - 7 x weekly - 3 sets - 10 reps - Seated Ankle Dorsiflexion AROM  - 1 x daily - 7 x weekly - 3 sets - 10 reps - Seated Hip Abduction with Resistance  - 1 x daily - 7 x weekly - 3 sets - 10 reps - Standing Hamstring Curl with Chair Support  - 1 x daily - 7 x weekly - 3 sets - 10-15 reps - Heel Toe Raises with Counter Support  - 1 x daily - 7 x weekly - 2-3 sets - 10 reps - Standing March with Counter Support  - 1 x daily - 7 x weekly - 2-3 sets - 10 reps    ______________________________________________ Note: Objective measures were  completed at Evaluation unless otherwise noted.  Vitals: 96% spO2, 97 bpm (102 bpm taken manually),  115/85 mmHg  DIAGNOSTIC FINDINGS: 11/05/23 echo TEE: Left Atrium: Left atrial size was severely dilated. A left atrial/left  atrial appendage thrombus was detected.   COGNITION: Overall cognitive status: Within functional limits for tasks assessed   SENSATION: WFL  POSTURE: rounded shoulders and forward head  LOWER EXTREMITY MMT:   MMT (in sitting) Right Eval Left Eval  Hip flexion 4+ 4+  Hip extension    Hip abduction 4+ 4+  Hip adduction 4+ 4+  Hip internal rotation    Hip external rotation    Knee flexion 4+ 4+  Knee extension 4+ 4  Ankle dorsiflexion 4+ 4+  Ankle plantarflexion 4 4  Ankle inversion    Ankle eversion    (Blank rows = not tested)  GAIT: Gait pattern: slow gait speed, pushing walker too far forward  Assistive device utilized: Environmental consultant - 2 wheeled Level of assistance: SBA  FUNCTIONAL TESTS:  5xSTS: 26.23 sec pushing off knees and audible R knee crepitus  Pre: 97% spO2, 89bpm  Post: 95% spO2, 102bpm   50M walk test: 17.53 sec with RW (1.87 ft/sec)                                                                                                                 __________________________________________________________________________________ GOALS: Goals reviewed with patient? Yes  SHORT TERM GOALS: Target date: 12/05/2023  Patient to be independent with initial HEP. Baseline: HEP initiated Goal status: MET  LONG TERM GOALS: Target date: 12/26/2023  (UPDATED TO 02/07/24 ON 5/14)  Patient to be independent with advanced HEP. Baseline: Not yet initiated  Goal status: IN PROGRESS, 12/31/2023  Patient to demonstrate gait speed of at least 2.0 ft/sec in order to improve access to community.  Baseline: 1.87 ft/sec 12/31/2023 Goal status: IN PROGRESS, 12/31/2023  Patient to report tolerance for grocery trip without fatigue limiting.    Baseline: unable Goal  status: IN PROGRESS, 12/31/2023  6 min walk test to improve to >600 ft with device as needed Baseline: 437 ft>710 ft in 6 MWT 12/17/23 Goal status: MET 12/17/2023  Patient to demonstrate 5xSTS test in <15 sec in order to decrease risk of falls.  Baseline: 26.23 sec pushing off knees and audible R knee crepitus; 45.41 seconds with arms crossed at chest 5/13 Goal status: IN PROGRESS, 12/31/2023   ASSESSMENT:  CLINICAL IMPRESSION: Pt presents today with no new complaints. Skilled PT session focused on aerobic warm up and sets of balance activities. Pt needs light UE support at times and seated rest breaks.  Worked on turns and pt is most challenged by this activity, but she performs well. Pt will continue to benefit from skilled PT towards goals for improved functional mobility and decreased fall risk.  OBJECTIVE IMPAIRMENTS: Abnormal gait, cardiopulmonary status limiting activity, decreased activity tolerance, decreased balance, difficulty walking, decreased strength, and pain.   PLAN:  PT FREQUENCY: 2x/week  PT DURATION: other: 5 weeks, including 12/31/2023 week and recert  PLANNED INTERVENTIONS: 97164- PT Re-evaluation, 97110-Therapeutic exercises, 97530- Therapeutic activity, 97112- Neuromuscular re-education, 97535- Self Care, 16109- Manual therapy, [phone]- Gait training, [phone]- Canalith repositioning, [phone]- Aquatic Therapy, Patient/Family education, Balance training, Stair training, Taping, Dry Needling, Vestibular training, DME instructions, Cryotherapy, and Moist heat  PLAN FOR NEXT SESSION: Check goals/discuss POC by end of next week; progress balance/gait/endurance while monitoring vitals, Exercises to decrease reliance on UE support with gait.      Dessie Flow, PT 01/27/24 12:28 PM Phone: [phone] Fax: [phone]   [facility] at [facility] [address] [place], [address] [place], [place] [address] Phone # [phone] Fax # [phone]

## 2024-01-27 NOTE — Telephone Encounter (Signed)
 It would be unlikely that she would be having a very delayed reaction to furosemide . Is she still taking spironolactone  or has this been stopped?

## 2024-01-27 NOTE — Telephone Encounter (Signed)
 Left a message for the pt to call back.

## 2024-01-27 NOTE — Telephone Encounter (Signed)
 Spoke with pt who states she has stopped Spironolactone .

## 2024-01-27 NOTE — Telephone Encounter (Signed)
 Patient is returning call.

## 2024-01-27 NOTE — Telephone Encounter (Signed)
 Pt would like a c/b regarding confirmation on return to work date. Pt needs to know if it is 6/11 or at the end of July?   Pt c/o medication issue:  1. Name of Medication: furosemide  (LASIX ) 40 MG tablet   2. How are you currently taking this medication (dosage and times per day)?   Take 1 tablet (40 mg total) by mouth daily.    3. Are you having a reaction (difficulty breathing--STAT)? No  4. What is your medication issue? Pt seems to think that medication is causing her to have swollen itchy ear lobes as well as itchy scalp. Pt states that she is allergic to Sulfur which seems to be in medication. Pt would like a c/b regarding this matter . Please advise

## 2024-01-28 ENCOUNTER — Ambulatory Visit: Admitting: Physical Therapy

## 2024-01-28 ENCOUNTER — Encounter: Payer: Self-pay | Admitting: Physical Therapy

## 2024-01-28 DIAGNOSIS — R29898 Other symptoms and signs involving the musculoskeletal system: Secondary | ICD-10-CM | POA: Diagnosis not present

## 2024-01-28 DIAGNOSIS — R2681 Unsteadiness on feet: Secondary | ICD-10-CM | POA: Diagnosis not present

## 2024-01-28 DIAGNOSIS — M6281 Muscle weakness (generalized): Secondary | ICD-10-CM | POA: Diagnosis not present

## 2024-01-28 DIAGNOSIS — R262 Difficulty in walking, not elsewhere classified: Secondary | ICD-10-CM | POA: Diagnosis not present

## 2024-01-28 DIAGNOSIS — R2689 Other abnormalities of gait and mobility: Secondary | ICD-10-CM

## 2024-01-28 NOTE — Telephone Encounter (Signed)
 RN spoke to patient . She states she needs to know when she can return to work per her disability information.  RN informed patient I do not see the disability form that indicate a return of work date.  It does not look as if they scan the document.  RN informed patient, RN will need to speak to the provider who signed the form. K.West NP--    Patient verbalized understanding.

## 2024-01-28 NOTE — Telephone Encounter (Signed)
 RN called patient - Per K,West NP She states the date on the form is 03/06/24  to return to work.  She states she handed form to her covering CMA to take to person who faxes forms to the disability form.   Michelle Wilcox asked -  if the company have the corrected form. RN informed patient ) did not asked that question - it was relayed to RN that form had been sent. RN informed patient  any question you can discuss at appointment tomorrow.  Patient verbalized understanding.

## 2024-01-28 NOTE — Telephone Encounter (Signed)
 The chance of cross reactivity with sulfur allergy is very low. There is only one non- sulfur like diuretic and it is generally hard to get and expensive. I find it unlikely its from furosemide . I would recommend she see dermatology/PCP

## 2024-01-28 NOTE — Telephone Encounter (Signed)
 Pt is requesting a callback regarding her return to work date that was supposed to be on the short term disability form. She'd like to know what the date is so that she can make arrangements. Please advise

## 2024-01-28 NOTE — Telephone Encounter (Addendum)
 Called left detail message on patient's voicemail - of  the pharmacist response to her question.  Maccia, Melissa D, RPH-CPP2 hours ago (8:08 AM)    The chance of cross reactivity with sulfur allergy is very low. There is only one non- sulfur like diuretic and it is generally hard to get and expensive. I find it unlikely its from furosemide . I would recommend she see dermatology/PCP      She may call back with any question. Patient also has an appointment with [facility] NP tomorrow 01/29/24. She may discuss  at this appointment

## 2024-01-29 ENCOUNTER — Telehealth: Payer: Self-pay | Admitting: Pharmacist

## 2024-01-29 ENCOUNTER — Encounter: Payer: Self-pay | Admitting: Cardiology

## 2024-01-29 ENCOUNTER — Ambulatory Visit: Attending: Cardiology | Admitting: Cardiology

## 2024-01-29 VITALS — BP 118/72 | HR 76 | Ht 63.0 in | Wt 181.0 lb

## 2024-01-29 DIAGNOSIS — R5383 Other fatigue: Secondary | ICD-10-CM

## 2024-01-29 DIAGNOSIS — R7989 Other specified abnormal findings of blood chemistry: Secondary | ICD-10-CM

## 2024-01-29 DIAGNOSIS — I340 Nonrheumatic mitral (valve) insufficiency: Secondary | ICD-10-CM

## 2024-01-29 DIAGNOSIS — I4819 Other persistent atrial fibrillation: Secondary | ICD-10-CM

## 2024-01-29 DIAGNOSIS — I5032 Chronic diastolic (congestive) heart failure: Secondary | ICD-10-CM

## 2024-01-29 DIAGNOSIS — E782 Mixed hyperlipidemia: Secondary | ICD-10-CM

## 2024-01-29 MED ORDER — FUROSEMIDE 20 MG PO TABS: 20.0000 mg | ORAL_TABLET | Freq: Every day | ORAL | 3 refills | Status: AC

## 2024-01-29 NOTE — Therapy (Signed)
 OUTPATIENT PHYSICAL THERAPY NEURO PROGRESS NOTE/RECERT   Patient Name: Michelle Wilcox MRN: [id number] DOB:1946/07/08, 78 y.o., female Today's Date: 01/30/2024  PCP: [name], MD REFERRING PROVIDER: Lesa Rape, MD   Progress Note Reporting Period 01/02/24 to 01/30/24  See note below for Objective Data and Assessment of Progress/Goals.     END OF SESSION:  PT End of Session - 01/30/24 1524     Visit Number 18    Number of Visits 27    Date for PT Re-Evaluation 03/12/24    Authorization Type [employer] Medicare    Progress Note Due on Visit 28   PN completed at visit 9   PT Start Time 1448    PT Stop Time 1529    PT Time Calculation (min) 41 min    Equipment Utilized During Treatment Gait belt    Activity Tolerance Patient tolerated treatment well;Patient limited by fatigue    Behavior During Therapy WFL for tasks assessed/performed   Tearful at times             Past Medical History:  Diagnosis Date   Anxiety    hx of   Asthma    at times   CHF (congestive heart failure) (HCC)    Depression    hx of   Dyspnea    Giant cell arteritis (HCC)    Gouty arthropathy    Hemolytic anemia (HCC)    Hyperlipidemia    Hypertension    Irregular heart beat    extra beat   Migraines    Polymyalgia (HCC)    Polymyalgia rheumatica (HCC)    Temporal arteritis (HCC)    Past Surgical History:  Procedure Laterality Date   BARTHOLIN GLAND CYST EXCISION     non ca   BREAST BIOPSY Right    CARDIOVERSION N/A 11/05/2023   Procedure: CARDIOVERSION;  Surgeon: Jerryl Morin, DO;  Location: [facility];  Service: Cardiovascular;  Laterality: N/A;   CARDIOVERSION N/A 12/19/2023   Procedure: CARDIOVERSION;  Surgeon: Jann Melody, MD;  Location: [facility];  Service: Cardiovascular;  Laterality: N/A;   EYE MUSCLE SURGERY     as a child 55 yrs old   KNEE SURGERY  2004   rt knee   LAPAROSCOPIC SPLENECTOMY N/A 06/18/2018   Procedure: LAPAROSCOPIC  SPLENECTOMY ERAS PATHWAY;  Surgeon: Ayesha Lente, MD;  Location: [facility];  Service: General;  Laterality: N/A;   toncil     TONSILLECTOMY     as a child   TRANSESOPHAGEAL ECHOCARDIOGRAM (CATH LAB) N/A 11/05/2023   Procedure: TRANSESOPHAGEAL ECHOCARDIOGRAM;  Surgeon: Jerryl Morin, DO;  Location: [facility];  Service: Cardiovascular;  Laterality: N/A;   TRANSESOPHAGEAL ECHOCARDIOGRAM (CATH LAB) N/A 12/19/2023   Procedure: TRANSESOPHAGEAL ECHOCARDIOGRAM;  Surgeon: Jann Melody, MD;  Location: [facility];  Service: Cardiovascular;  Laterality: N/A;   Patient Active Problem List   Diagnosis Date Noted   Acute on chronic congestive heart failure (HCC) 12/24/2023   Acute on chronic HFrEF (heart failure with reduced ejection fraction) (HCC) 12/21/2023   Atrial fibrillation with rapid ventricular response (HCC) 12/21/2023   Atrial fibrillation, rapid (HCC) 12/21/2023   Pericardial effusion 11/07/2023   Atrial fibrillation with RVR (HCC) 11/05/2023   Borderline abnormal TFTs 11/05/2023   Macrocytosis 11/05/2023   Atrial fibrillation (HCC) 11/04/2023   PMR (polymyalgia rheumatica) (HCC) 11/04/2023   Primary localized osteoarthrosis of multiple sites 06/15/2022   Diastolic dysfunction without heart failure 03/05/2018   DOE (dyspnea  on exertion) 03/05/2018   Preop cardiovascular exam 03/05/2018   Mild reactive airways disease 08/04/2017   Volume overload 08/03/2017   Hypokalemia 08/03/2017   Essential hypertension 08/03/2017   Hyperlipidemia 08/03/2017   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   Hypersensitivity reaction 11/20/2016   Drusen (degenerative) of retina, bilateral 05/22/2016   Nuclear cataract 05/22/2016   Retinal hemorrhage of left eye 05/22/2016   Temporal arteritis (HCC) 05/22/2016   Hemolytic anemia (HCC) 03/19/2016   Breast mass, right 07/01/2012    ONSET DATE: 11/04/23  REFERRING DIAG: I48.91 (ICD-10-CM) - Atrial fibrillation with RVR  (HCC)  THERAPY DIAG:  Other abnormalities of gait and mobility  Unsteadiness on feet  Muscle weakness (generalized)  Other symptoms and signs involving the musculoskeletal system  Rationale for Evaluation and Treatment: Rehabilitation  SUBJECTIVE:                                                                                                                                                                                             SUBJECTIVE STATEMENT: Cardiac PA changed her Furosamide dosage. Planning for 2 heart caths. Reports that her husband has been doing the grocery shopping. She has been able to tolerate short trips lasting ~10 minutes with her walker but recently went to [employer] at tried a long trip and it felt like it was too much. Reports balance feels better.   Pt accompanied by: self PERTINENT HISTORY: 12/31/2023:  Pt underwent attempt at cardioversion for a-fib on 12/19/23, which did not last >24 hrs; was hospitalized after ED visit for SOB, CHF (*New orders received for PT resume and treat).  Anxiety, asthma, CHF, depression, gouty arthropathy, anemia, HLD, HTN, migraines, polymyalgia rheumatica, temporal arteritis, R knee surgery   PAIN:  Are you having pain? Yes: NPRS scale: no Pain location: R knee Pain description: sore Aggravating factors: previous fall Relieving factors: knee brace  PRECAUTIONS: Fall; per Cardiac NP's recommendation: goal resting HR is <110bpm. goal heart rate is <135 bpm with exertion.  12/31/2023:  Have HR monitor (will wear for 14 days).  Monitor BP  WEIGHT BEARING RESTRICTIONS: No  FALLS: Has patient fallen in last 6 months? Yes. Number of falls 1  LIVING ENVIRONMENT: Lives with: lives with their spouse who has stage 4 stomach CA (pt reports that she is having to physically assist him some) Lives in: Other townhouse  Stairs: 2 stories, sleeps on 1st floor in recliner  Has following equipment at home: Walker - 2 wheeled,  cane  PLOF: Independent; currently getting assistance with meals   PATIENT GOALS: improve fatigue   OBJECTIVE:     TODAY'S  TREATMENT: 01/30/24 Activity Comments  Vitals at start of session 96% spO2, 81bpm, 124/73mmHg   Pre:96% spO2, 81bpm, 124/41mmHg RPE: 7/20  Post: 94% spO2, 84bpm, 144/87 mmHg RPE:14/20  Pt completed 838 feet with RW  25M  12.09 sec with RW  5xSTS 15.9 sec without UEs     PATIENT EDUCATION: Education details: discussed exam findings and progress towards goals; discussed [employer] membership or short durations of walking with [employer] in the neighborhood as she is able and using RPE at 60-70% of max effort; recert  Person educated: Patient Education method: Explanation Education comprehension: verbalized understanding    Access Code: [email or web address] URL: [email or web address] Date: 01/16/2024 Prepared by: [facility] - [facility]  Program Notes Seated step out and in, each leg, 3 x 10 reps  Exercises - Seated March  - 1 x daily - 7 x weekly - 3 sets - 10 reps - Seated Long Arc Quad  - 1 x daily - 7 x weekly - 3 sets - 10 reps - Seated Ankle Dorsiflexion AROM  - 1 x daily - 7 x weekly - 3 sets - 10 reps - Seated Hip Abduction with Resistance  - 1 x daily - 7 x weekly - 3 sets - 10 reps - Standing Hamstring Curl with Chair Support  - 1 x daily - 7 x weekly - 3 sets - 10-15 reps - Heel Toe Raises with Counter Support  - 1 x daily - 7 x weekly - 2-3 sets - 10 reps - Standing March with Counter Support  - 1 x daily - 7 x weekly - 2-3 sets - 10 reps    ______________________________________________ Note: Objective measures were completed at Evaluation unless otherwise noted.  Vitals: 96% spO2, 97 bpm (102 bpm taken manually),  115/85 mmHg  DIAGNOSTIC FINDINGS: 11/05/23 echo TEE: Left Atrium: Left atrial size was severely dilated. A left atrial/left  atrial appendage thrombus was detected.   COGNITION: Overall cognitive  status: Within functional limits for tasks assessed   SENSATION: WFL  POSTURE: rounded shoulders and forward head  LOWER EXTREMITY MMT:   MMT (in sitting) Right Eval Left Eval  Hip flexion 4+ 4+  Hip extension    Hip abduction 4+ 4+  Hip adduction 4+ 4+  Hip internal rotation    Hip external rotation    Knee flexion 4+ 4+  Knee extension 4+ 4  Ankle dorsiflexion 4+ 4+  Ankle plantarflexion 4 4  Ankle inversion    Ankle eversion    (Blank rows = not tested)  GAIT: Gait pattern: slow gait speed, pushing walker too far forward Assistive device utilized: Walker - 2 wheeled Level of assistance: SBA  FUNCTIONAL TESTS:  5xSTS: 26.23 sec pushing off knees and audible R knee crepitus  Pre: 97% spO2, 89bpm  Post: 95% spO2, 102bpm   25M walk test: 17.53 sec with RW (1.87 ft/sec)  __________________________________________________________________________________ GOALS: Goals reviewed with patient? Yes  SHORT TERM GOALS: Target date: 12/05/2023  Patient to be independent with initial HEP. Baseline: HEP initiated Goal status: MET  LONG TERM GOALS: Target date: 03/12/2024    Patient to be independent with advanced HEP. Baseline: Not yet initiated  Goal status: IN PROGRESS, 12/31/2023  Patient to demonstrate gait speed of at least 2.0 ft/sec in order to improve access to community.  Baseline: 1.87 ft/sec 12/31/2023; 12.09 sec with RW (2.7 ft/sec) 01/30/24 Goal status: MET  01/30/24  Patient to report tolerance for grocery trip without fatigue limiting.    Baseline: unable; She has been able to tolerate short trips lasting ~10 minutes with her walker but recently went to [employer] at tried a long trip and it felt like it was too much. 01/30/24 Goal status: IN PROGRESS, 01/30/24  6 min walk test to improve to >600 ft with device as needed Baseline: 437 ft>710 ft in 6  MWT 12/17/23 Goal status: MET 12/17/2023  Patient to demonstrate 5xSTS test in <15 sec in order to decrease risk of falls.  Baseline: 26.23 sec pushing off knees and audible R knee crepitus; 45.41 seconds with arms crossed at chest 5/13; 15.9 sec without UEs 01/30/24 Goal status: IN PROGRESS,  01/30/24  6 min walk test to improve to at least 1,000 ft with device as needed Baseline: 838 feet with RW 01/30/24 Goal status: INITIAL 01/30/24     ASSESSMENT:  CLINICAL IMPRESSION: Patient arrived to session with report of some changes to her meds after recent Cardiology appointment. Patient's gait speed is now 2.7 ft/sec. Reports tolerance for a 10 minute grocery trip but no longer d/t fatigue. Patient's transfer speed has improved significantly as evidenced by 5xSTS. Patient demonstrated significant gains in endurance and mobility. Would benefit from additional skilled PT services 2x/week for 3 weeks followed by 1x/week for 3 weeks. No complaints at end of session. OBJECTIVE IMPAIRMENTS: Abnormal gait, cardiopulmonary status limiting activity, decreased activity tolerance, decreased balance, difficulty walking, decreased strength, and pain.   PLAN:  PT FREQUENCY: 2x/week for 3 weeks followed by 1x/week for 3 weeks  PT DURATION: -  PLANNED INTERVENTIONS: 97164- PT Re-evaluation, 97110-Therapeutic exercises, 97530- Therapeutic activity, [id number]- Neuromuscular re-education, 97535- Self Care, 64332- Manual therapy, [id number]- Gait training, [phone]- Canalith repositioning, [phone]- Aquatic Therapy, Patient/Family education, Balance training, Stair training, Taping, Dry Needling, Vestibular training, DME instructions, Cryotherapy, and Moist heat  PLAN FOR NEXT SESSION: progress balance/gait/endurance while monitoring vitals, Exercises to decrease reliance on UE support with gait.     Thaddeus Filippo, PT, DPT 01/30/24 3:31 PM  [facility] [address] [place],  [address] [place], [place] [address] Phone # [phone] Fax # [phone]

## 2024-01-29 NOTE — Progress Notes (Addendum)
   01/29/2024  Patient ID: Michelle Wilcox, female   DOB: 14-May-1946, 79 y.o.   MRN: [id number]  Patient appeared on insurance report for at-risk for failing 2025 metric: Medication Adherence for Cholesterol (MAC)    Medication: Rosuvastatin  5mg   Last fill date: 12/01/23 30DS  Recent fill date of 5/24 30DS. Will call pharmacy on 6/20 to see if able to get 90DS with refills instead.   Update from 02/07/24: - Called the pharmacy and requested to do a 90DS instead of 30DS - Said that it is coming from the central fill location, so they cannot switch it to 90DS at this time and will arrive at the pharmacy on Monday for pick-up   Delvin File, PharmD [facility]  Phone Number: [phone]

## 2024-01-29 NOTE — Patient Instructions (Signed)
 Medication Instructions:  Decrease Lasix  to 20 mg once a day. Take an additional dose of lasix  as needed for lower extremity edema. *If you need a refill on your cardiac medications before your next appointment, please call your pharmacy*  Lab Work: No labs If you have labs (blood work) drawn today and your tests are completely normal, you will receive your results only by: MyChart Message (if you have MyChart) OR A paper copy in the mail If you have any lab test that is abnormal or we need to change your treatment, we will call you to review the results.  Testing/Procedures: No testing  Follow-Up: At [facility], you and your health needs are our priority.  As part of our continuing mission to provide you with exceptional heart care, our providers are all part of one team.  This team includes your primary Cardiologist (physician) and Advanced Practice Providers or APPs (Physician Assistants and Nurse Practitioners) who all work together to provide you with the care you need, when you need it.  Your next appointment:   4 week(s)  Provider:   [name], NP   We recommend signing up for the patient portal called MyChart.  Sign up information is provided on this After Visit Summary.  MyChart is used to connect with patients for Virtual Visits (Telemedicine).  Patients are able to view lab/test results, encounter notes, upcoming appointments, etc.  Non-urgent messages can be sent to your provider as well.   To learn more about what you can do with MyChart, go to [email or web address].

## 2024-01-30 ENCOUNTER — Encounter: Payer: Self-pay | Admitting: Physical Therapy

## 2024-01-30 ENCOUNTER — Ambulatory Visit: Admitting: Physical Therapy

## 2024-01-30 DIAGNOSIS — R262 Difficulty in walking, not elsewhere classified: Secondary | ICD-10-CM | POA: Diagnosis not present

## 2024-01-30 DIAGNOSIS — R2689 Other abnormalities of gait and mobility: Secondary | ICD-10-CM

## 2024-01-30 DIAGNOSIS — R2681 Unsteadiness on feet: Secondary | ICD-10-CM

## 2024-01-30 DIAGNOSIS — R29898 Other symptoms and signs involving the musculoskeletal system: Secondary | ICD-10-CM | POA: Diagnosis not present

## 2024-01-30 DIAGNOSIS — M6281 Muscle weakness (generalized): Secondary | ICD-10-CM

## 2024-01-31 ENCOUNTER — Encounter: Payer: Self-pay | Admitting: Cardiology

## 2024-02-03 NOTE — Therapy (Signed)
 OUTPATIENT PHYSICAL THERAPY NEURO TREATMENT   Patient Name: Michelle Wilcox MRN: [id number] DOB:08-26-1945, 78 y.o., female Today's Date: 02/04/2024  PCP: [name], MD REFERRING PROVIDER: Lesa Rape, MD      END OF SESSION:  PT End of Session - 02/04/24 1158     Visit Number 19    Number of Visits 27    Date for PT Re-Evaluation 03/12/24    Authorization Type BCBS Medicare    Progress Note Due on Visit 28   PN completed at visit 9   PT Start Time 1149    PT Stop Time 1228    PT Time Calculation (min) 39 min    Equipment Utilized During Treatment Gait belt    Activity Tolerance Patient tolerated treatment well    Behavior During Therapy WFL for tasks assessed/performed   Tearful at times              Past Medical History:  Diagnosis Date   Anxiety    hx of   Asthma    at times   CHF (congestive heart failure) (HCC)    Depression    hx of   Dyspnea    Giant cell arteritis (HCC)    Gouty arthropathy    Hemolytic anemia (HCC)    Hyperlipidemia    Hypertension    Irregular heart beat    extra beat   Migraines    Polymyalgia (HCC)    Polymyalgia rheumatica (HCC)    Temporal arteritis (HCC)    Past Surgical History:  Procedure Laterality Date   BARTHOLIN GLAND CYST EXCISION     non ca   BREAST BIOPSY Right    CARDIOVERSION N/A 11/05/2023   Procedure: CARDIOVERSION;  Surgeon: Jerryl Morin, DO;  [facility];  Service: Cardiovascular;  Laterality: N/A;   CARDIOVERSION N/A 12/19/2023   Procedure: CARDIOVERSION;  Surgeon: Jann Melody, MD;  [facility];  Service: Cardiovascular;  Laterality: N/A;   EYE MUSCLE SURGERY     as a child 18 yrs old   KNEE SURGERY  2004   rt knee   LAPAROSCOPIC SPLENECTOMY N/A 06/18/2018   Procedure: LAPAROSCOPIC SPLENECTOMY ERAS PATHWAY;  Surgeon: Ayesha Lente, MD;  Location: [facility];  Service: General;  Laterality: N/A;   toncil     TONSILLECTOMY     as a child    TRANSESOPHAGEAL ECHOCARDIOGRAM (CATH LAB) N/A 11/05/2023   Procedure: TRANSESOPHAGEAL ECHOCARDIOGRAM;  Surgeon: Jerryl Morin, DO;  [facility];  Service: Cardiovascular;  Laterality: N/A;   TRANSESOPHAGEAL ECHOCARDIOGRAM (CATH LAB) N/A 12/19/2023   Procedure: TRANSESOPHAGEAL ECHOCARDIOGRAM;  Surgeon: Jann Melody, MD;  [facility];  Service: Cardiovascular;  Laterality: N/A;   Patient Active Problem List   Diagnosis Date Noted   Acute on chronic congestive heart failure (HCC) 12/24/2023   Acute on chronic HFrEF (heart failure with reduced ejection fraction) (HCC) 12/21/2023   Atrial fibrillation with rapid ventricular response (HCC) 12/21/2023   Atrial fibrillation, rapid (HCC) 12/21/2023   Pericardial effusion 11/07/2023   Atrial fibrillation with RVR (HCC) 11/05/2023   Borderline abnormal TFTs 11/05/2023   Macrocytosis 11/05/2023   Atrial fibrillation (HCC) 11/04/2023   PMR (polymyalgia rheumatica) (HCC) 11/04/2023   Primary localized osteoarthrosis of multiple sites 06/15/2022   Diastolic dysfunction without heart failure 03/05/2018   DOE (dyspnea on exertion) 03/05/2018   Preop cardiovascular exam 03/05/2018   Mild reactive airways disease 08/04/2017   Volume overload 08/03/2017  Hypokalemia 08/03/2017   Essential hypertension 08/03/2017   Hyperlipidemia 08/03/2017   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   Hypersensitivity reaction 11/20/2016   Drusen (degenerative) of retina, bilateral 05/22/2016   Nuclear cataract 05/22/2016   Retinal hemorrhage of left eye 05/22/2016   Temporal arteritis (HCC) 05/22/2016   Hemolytic anemia (HCC) 03/19/2016   Breast mass, right 07/01/2012    ONSET DATE: 11/04/23  REFERRING DIAG: I48.91 (ICD-10-CM) - Atrial fibrillation with RVR (HCC)  THERAPY DIAG:  Other abnormalities of gait and mobility  Unsteadiness on feet  Muscle weakness (generalized)  Other symptoms and signs involving the  musculoskeletal system  Rationale for Evaluation and Treatment: Rehabilitation  SUBJECTIVE:                                                                                                                                                                                             SUBJECTIVE STATEMENT: Reports BP has been good lately. I feel so much better when BP is not to low. Reports that she has not gone walking outside for exercise as discussed last session.   Pt accompanied by: self PERTINENT HISTORY: 12/31/2023:  Pt underwent attempt at cardioversion for a-fib on 12/19/23, which did not last >24 hrs; was hospitalized after ED visit for SOB, CHF (*New orders received for PT resume and treat).  Anxiety, asthma, CHF, depression, gouty arthropathy, anemia, HLD, HTN, migraines, polymyalgia rheumatica, temporal arteritis, R knee surgery   PAIN:  Are you having pain? Yes: NPRS scale: no Pain location: R knee Pain description: sore Aggravating factors: previous fall Relieving factors: knee brace  PRECAUTIONS: Fall; per Cardiac NP's recommendation: goal resting HR is <110bpm. goal heart rate is <135 bpm with exertion.  12/31/2023:  Have HR monitor (will wear for 14 days).  Monitor BP  WEIGHT BEARING RESTRICTIONS: No  FALLS: Has patient fallen in last 6 months? Yes. Number of falls 1  LIVING ENVIRONMENT: Lives with: lives with their spouse who has stage 4 stomach CA (pt reports that she is having to physically assist him some) Lives in: Other townhouse  Stairs: 2 stories, sleeps on 1st floor in recliner  Has following equipment at home: Environmental consultant - 2 wheeled, cane  PLOF: Independent; currently getting assistance with meals   PATIENT GOALS: improve fatigue   OBJECTIVE:     TODAY'S TREATMENT: 02/04/24 Activity Comments  Vitals at start of session 129/68 mmHg, 68bpm  Nustep L5-6 x 6 min UE/LEs  Cueing for pacing d/t increased resistance today   Superset: step ups 6 2x30 fwd/back  stepping 2x10 each side With UE support. Some difficulty coordinating alt steps  gait training with RW for 1 minute in between supersets   In order to improve walking tolerance at work  Superset: Alt side step step over 1/2 foam roll 2x30 each LE with 3# standing march #3 2x30 Requires UE support       Access Code: [email or web address] URL: [email or web address] Date: 01/16/2024 Prepared by: [facility] - Outpatient  Rehab - [facility]  Program Notes Seated step out and in, each leg, 3 x 10 reps  Exercises - Seated March  - 1 x daily - 7 x weekly - 3 sets - 10 reps - Seated Long Arc Quad  - 1 x daily - 7 x weekly - 3 sets - 10 reps - Seated Ankle Dorsiflexion AROM  - 1 x daily - 7 x weekly - 3 sets - 10 reps - Seated Hip Abduction with Resistance  - 1 x daily - 7 x weekly - 3 sets - 10 reps - Standing Hamstring Curl with Chair Support  - 1 x daily - 7 x weekly - 3 sets - 10-15 reps - Heel Toe Raises with Counter Support  - 1 x daily - 7 x weekly - 2-3 sets - 10 reps - Standing March with Counter Support  - 1 x daily - 7 x weekly - 2-3 sets - 10 reps    ______________________________________________ Note: Objective measures were completed at Evaluation unless otherwise noted.  Vitals: 96% spO2, 97 bpm (102 bpm taken manually),  115/85 mmHg  DIAGNOSTIC FINDINGS: 11/05/23 echo TEE: Left Atrium: Left atrial size was severely dilated. A left atrial/left  atrial appendage thrombus was detected.   COGNITION: Overall cognitive status: Within functional limits for tasks assessed   SENSATION: WFL  POSTURE: rounded shoulders and forward head  LOWER EXTREMITY MMT:   MMT (in sitting) Right Eval Left Eval  Hip flexion 4+ 4+  Hip extension    Hip abduction 4+ 4+  Hip adduction 4+ 4+  Hip internal rotation    Hip external rotation    Knee flexion 4+ 4+  Knee extension 4+ 4  Ankle dorsiflexion 4+ 4+  Ankle plantarflexion 4 4  Ankle inversion    Ankle eversion     (Blank rows = not tested)  GAIT: Gait pattern: slow gait speed, pushing walker too far forward Assistive device utilized: Walker - 2 wheeled Level of assistance: SBA  FUNCTIONAL TESTS:  5xSTS: 26.23 sec pushing off knees and audible R knee crepitus  Pre: 97% spO2, 89bpm  Post: 95% spO2, 102bpm   48M walk test: 17.53 sec with RW (1.87 ft/sec)                                                                                                                 __________________________________________________________________________________ GOALS: Goals reviewed with patient? Yes  SHORT TERM GOALS: Target date: 12/05/2023  Patient to be independent with initial HEP. Baseline: HEP initiated Goal status: MET  LONG TERM GOALS: Target date: 03/12/2024    Patient to be independent with advanced HEP. Baseline: Not yet  initiated  Goal status: IN PROGRESS, 12/31/2023  Patient to demonstrate gait speed of at least 2.0 ft/sec in order to improve access to community.  Baseline: 1.87 ft/sec 12/31/2023; 12.09 sec with RW (2.7 ft/sec) 01/30/24 Goal status: MET  01/30/24  Patient to report tolerance for grocery trip without fatigue limiting.    Baseline: unable; She has been able to tolerate short trips lasting ~10 minutes with her walker but recently went to [employer] at tried a long trip and it felt like it was too much. 01/30/24 Goal status: IN PROGRESS, 01/30/24  6 min walk test to improve to >600 ft with device as needed Baseline: 437 ft>710 ft in 6 MWT 12/17/23 Goal status: MET 12/17/2023  Patient to demonstrate 5xSTS test in <15 sec in order to decrease risk of falls.  Baseline: 26.23 sec pushing off knees and audible R knee crepitus; 45.41 seconds with arms crossed at chest 5/13; 15.9 sec without UEs 01/30/24 Goal status: IN PROGRESS,  01/30/24  6 min walk test to improve to at least 1,000 ft with device as needed Baseline: 838 feet with RW 01/30/24 Goal status: IN PROGRESS       ASSESSMENT:  CLINICAL IMPRESSION: Patient arrived to session without complaints. Vitals WFL at start of session. Patient performed progressive strengthening and standing balance tasks to increase endurance and stamina. Increased time spent standing/moving by having patient perform 1 minute of gait training in between activities before taking a rest break. Patient tolerated this progression well and required less rest breaks than before. Patient reported fatigue at end of session, otherwise without complaints upon leaving.  OBJECTIVE IMPAIRMENTS: Abnormal gait, cardiopulmonary status limiting activity, decreased activity tolerance, decreased balance, difficulty walking, decreased strength, and pain.   PLAN:  PT FREQUENCY: 2x/week for 3 weeks followed by 1x/week for 3 weeks  PT DURATION: -  PLANNED INTERVENTIONS: 97164- PT Re-evaluation, 97110-Therapeutic exercises, 97530- Therapeutic activity, [id number]- Neuromuscular re-education, 97535- Self Care, 16109- Manual therapy, [id number]- Gait training, [phone]- Canalith repositioning, [phone]- Aquatic Therapy, Patient/Family education, Balance training, Stair training, Taping, Dry Needling, Vestibular training, DME instructions, Cryotherapy, and Moist heat  PLAN FOR NEXT SESSION: progress balance/gait/endurance while monitoring vitals, Exercises to decrease reliance on UE support with gait.     Thaddeus Filippo, PT, DPT 02/04/24 12:30 PM  [facility] at [facility] [address] [place], [address] [place], [place] [address] Phone # [phone] Fax # [phone]

## 2024-02-04 ENCOUNTER — Ambulatory Visit: Admitting: Physical Therapy

## 2024-02-04 ENCOUNTER — Encounter: Payer: Self-pay | Admitting: Physical Therapy

## 2024-02-04 DIAGNOSIS — M6281 Muscle weakness (generalized): Secondary | ICD-10-CM

## 2024-02-04 DIAGNOSIS — R29898 Other symptoms and signs involving the musculoskeletal system: Secondary | ICD-10-CM | POA: Diagnosis not present

## 2024-02-04 DIAGNOSIS — R2689 Other abnormalities of gait and mobility: Secondary | ICD-10-CM | POA: Diagnosis not present

## 2024-02-04 DIAGNOSIS — R262 Difficulty in walking, not elsewhere classified: Secondary | ICD-10-CM | POA: Diagnosis not present

## 2024-02-04 DIAGNOSIS — R2681 Unsteadiness on feet: Secondary | ICD-10-CM

## 2024-02-06 ENCOUNTER — Ambulatory Visit: Admitting: Physical Therapy

## 2024-02-06 ENCOUNTER — Encounter: Payer: Self-pay | Admitting: Physical Therapy

## 2024-02-06 DIAGNOSIS — R2681 Unsteadiness on feet: Secondary | ICD-10-CM | POA: Diagnosis not present

## 2024-02-06 DIAGNOSIS — M6281 Muscle weakness (generalized): Secondary | ICD-10-CM | POA: Diagnosis not present

## 2024-02-06 DIAGNOSIS — R2689 Other abnormalities of gait and mobility: Secondary | ICD-10-CM

## 2024-02-06 DIAGNOSIS — R29898 Other symptoms and signs involving the musculoskeletal system: Secondary | ICD-10-CM | POA: Diagnosis not present

## 2024-02-06 DIAGNOSIS — R262 Difficulty in walking, not elsewhere classified: Secondary | ICD-10-CM | POA: Diagnosis not present

## 2024-02-06 NOTE — Therapy (Signed)
 OUTPATIENT PHYSICAL THERAPY NEURO TREATMENT   Patient Name: Michelle Wilcox MRN: [id number] DOB:12-23-1945, 78 y.o., female Today's Date: 02/07/2024  PCP: [name], MD REFERRING PROVIDER: Lesa Rape, MD      END OF SESSION:  PT End of Session - 02/06/24 1451     Visit Number 20    Number of Visits 27    Date for PT Re-Evaluation 03/12/24    Authorization Type BCBS Medicare    Progress Note Due on Visit 28   PN completed at visit 9   PT Start Time 1451    PT Stop Time 1532    PT Time Calculation (min) 41 min    Equipment Utilized During Treatment Gait belt    Activity Tolerance Patient tolerated treatment well    Behavior During Therapy WFL for tasks assessed/performed   Tearful at times               Past Medical History:  Diagnosis Date   Anxiety    hx of   Asthma    at times   CHF (congestive heart failure) (HCC)    Depression    hx of   Dyspnea    Giant cell arteritis (HCC)    Gouty arthropathy    Hemolytic anemia (HCC)    Hyperlipidemia    Hypertension    Irregular heart beat    extra beat   Migraines    Polymyalgia (HCC)    Polymyalgia rheumatica (HCC)    Temporal arteritis (HCC)    Past Surgical History:  Procedure Laterality Date   BARTHOLIN GLAND CYST EXCISION     non ca   BREAST BIOPSY Right    CARDIOVERSION N/A 11/05/2023   Procedure: CARDIOVERSION;  Surgeon: Jerryl Morin, DO;  [facility];  Service: Cardiovascular;  Laterality: N/A;   CARDIOVERSION N/A 12/19/2023   Procedure: CARDIOVERSION;  Surgeon: Jann Melody, MD;  [facility]: [facility];  Service: Cardiovascular;  Laterality: N/A;   EYE MUSCLE SURGERY     as a child 41 yrs old   KNEE SURGERY  2004   rt knee   LAPAROSCOPIC SPLENECTOMY N/A 06/18/2018   Procedure: LAPAROSCOPIC SPLENECTOMY ERAS PATHWAY;  Surgeon: Ayesha Lente, MD;  Location: [facility];  Service: General;  Laterality: N/A;   toncil     TONSILLECTOMY     as a child    TRANSESOPHAGEAL ECHOCARDIOGRAM (CATH LAB) N/A 11/05/2023   Procedure: TRANSESOPHAGEAL ECHOCARDIOGRAM;  Surgeon: Jerryl Morin, DO;  [facility]: [facility];  Service: Cardiovascular;  Laterality: N/A;   TRANSESOPHAGEAL ECHOCARDIOGRAM (CATH LAB) N/A 12/19/2023   Procedure: TRANSESOPHAGEAL ECHOCARDIOGRAM;  Surgeon: Jann Melody, MD;  [facility]: [facility];  Service: Cardiovascular;  Laterality: N/A;   Patient Active Problem List   Diagnosis Date Noted   Acute on chronic congestive heart failure (HCC) 12/24/2023   Acute on chronic HFrEF (heart failure with reduced ejection fraction) (HCC) 12/21/2023   Atrial fibrillation with rapid ventricular response (HCC) 12/21/2023   Atrial fibrillation, rapid (HCC) 12/21/2023   Pericardial effusion 11/07/2023   Atrial fibrillation with RVR (HCC) 11/05/2023   Borderline abnormal TFTs 11/05/2023   Macrocytosis 11/05/2023   Atrial fibrillation (HCC) 11/04/2023   PMR (polymyalgia rheumatica) (HCC) 11/04/2023   Primary localized osteoarthrosis of multiple sites 06/15/2022   Diastolic dysfunction without heart failure 03/05/2018   DOE (dyspnea on exertion) 03/05/2018   Preop cardiovascular exam 03/05/2018   Mild reactive airways disease 08/04/2017   Volume overload 08/03/2017  Hypokalemia 08/03/2017   Essential hypertension 08/03/2017   Hyperlipidemia 08/03/2017   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   Hypersensitivity reaction 11/20/2016   Drusen (degenerative) of retina, bilateral 05/22/2016   Nuclear cataract 05/22/2016   Retinal hemorrhage of left eye 05/22/2016   Temporal arteritis (HCC) 05/22/2016   Hemolytic anemia (HCC) 03/19/2016   Breast mass, right 07/01/2012    ONSET DATE: 11/04/23  REFERRING DIAG: I48.91 (ICD-10-CM) - Atrial fibrillation with RVR (HCC)  THERAPY DIAG:  Other abnormalities of gait and mobility  Unsteadiness on feet  Rationale for Evaluation and Treatment: Rehabilitation  SUBJECTIVE:                                                                                                                                                                                              SUBJECTIVE STATEMENT: That last session really was hard.  The BP is better, so I have more energy.  I did go to the grocery store yesterday.  I can tell my balance is getting better.  Did some walking outdoors with my husband about 15 minutes with rollator this morning.  Pt accompanied by: self PERTINENT HISTORY: 12/31/2023:  Pt underwent attempt at cardioversion for a-fib on 12/19/23, which did not last >24 hrs; was hospitalized after ED visit for SOB, CHF (*New orders received for PT resume and treat).  Anxiety, asthma, CHF, depression, gouty arthropathy, anemia, HLD, HTN, migraines, polymyalgia rheumatica, temporal arteritis, R knee surgery   PAIN:  Are you having pain? Yes: NPRS scale: no Pain location: R knee Pain description: sore Aggravating factors: previous fall Relieving factors: knee brace  PRECAUTIONS: Fall; per Cardiac NP's recommendation: goal resting HR is <110bpm. goal heart rate is <135 bpm with exertion.  12/31/2023:  Have HR monitor (will wear for 14 days).  Monitor BP  WEIGHT BEARING RESTRICTIONS: No  FALLS: Has patient fallen in last 6 months? Yes. Number of falls 1  LIVING ENVIRONMENT: Lives with: lives with their spouse who has stage 4 stomach CA (pt reports that she is having to physically assist him some) Lives in: Other townhouse  Stairs: 2 stories, sleeps on 1st floor in recliner  Has following equipment at home: Environmental consultant - 2 wheeled, cane  PLOF: Independent; currently getting assistance with meals   PATIENT GOALS: improve fatigue   OBJECTIVE:    TODAY'S TREATMENT: 02/06/2024 Activity Comments  Vitals:  94%,HR 75 120/80   NuStep, Level 4-5, 4 extremities x 6 minutes SPM 75 pace  at Level 4  Superset: Step taps 6, 2 x 30 step ups 6 2x30 Fwd step taps>back to  runner's stretch, 2x10 each  side 1 UE support; brief seated break, 95%, HR 85 bpm  Gait with RW between sets  Mod I, for improved tolerance for walking at work  Superset: Sidestep over obstacle x 30 sec each leg leading Four square step step activity, 30 sec clockwise/counterclockwise 95%, HR 89  Gait between activities with no device, supervision      TODAY'S TREATMENT: 02/04/24 Activity Comments  Vitals at start of session 129/68 mmHg, 68bpm  Nustep L5-6 x 6 min UE/LEs  Cueing for pacing d/t increased resistance today   Superset: step ups 6 2x30 fwd/back stepping 2x10 each side With UE support. Some difficulty coordinating alt steps   gait training with RW for 1 minute in between supersets   In order to improve walking tolerance at work  Superset: Alt side step step over 1/2 foam roll 2x30 each LE with 3# standing march #3 2x30 Requires UE support       Access Code: [email or web address] URL: [email or web address] Date: 01/16/2024 Prepared by: [facility] - Outpatient  Rehab - [facility]  Program Notes Seated step out and in, each leg, 3 x 10 reps  Exercises - Seated March  - 1 x daily - 7 x weekly - 3 sets - 10 reps - Seated Long Arc Quad  - 1 x daily - 7 x weekly - 3 sets - 10 reps - Seated Ankle Dorsiflexion AROM  - 1 x daily - 7 x weekly - 3 sets - 10 reps - Seated Hip Abduction with Resistance  - 1 x daily - 7 x weekly - 3 sets - 10 reps - Standing Hamstring Curl with Chair Support  - 1 x daily - 7 x weekly - 3 sets - 10-15 reps - Heel Toe Raises with Counter Support  - 1 x daily - 7 x weekly - 2-3 sets - 10 reps - Standing March with Counter Support  - 1 x daily - 7 x weekly - 2-3 sets - 10 reps    ______________________________________________ Note: Objective measures were completed at Evaluation unless otherwise noted.  Vitals: 96% spO2, 97 bpm (102 bpm taken manually),  115/85 mmHg  DIAGNOSTIC FINDINGS: 11/05/23 echo TEE: Left Atrium: Left atrial  size was severely dilated. A left atrial/left  atrial appendage thrombus was detected.   COGNITION: Overall cognitive status: Within functional limits for tasks assessed   SENSATION: WFL  POSTURE: rounded shoulders and forward head  LOWER EXTREMITY MMT:   MMT (in sitting) Right Eval Left Eval  Hip flexion 4+ 4+  Hip extension    Hip abduction 4+ 4+  Hip adduction 4+ 4+  Hip internal rotation    Hip external rotation    Knee flexion 4+ 4+  Knee extension 4+ 4  Ankle dorsiflexion 4+ 4+  Ankle plantarflexion 4 4  Ankle inversion    Ankle eversion    (Blank rows = not tested)  GAIT: Gait pattern: slow gait speed, pushing walker too far forward Assistive device utilized: Walker - 2 wheeled Level of assistance: SBA  FUNCTIONAL TESTS:  5xSTS: 26.23 sec pushing off knees and audible R knee crepitus  Pre: 97% spO2, 89bpm  Post: 95% spO2, 102bpm   42M walk test: 17.53 sec with RW (1.87 ft/sec)  __________________________________________________________________________________ GOALS: Goals reviewed with patient? Yes  SHORT TERM GOALS: Target date: 12/05/2023  Patient to be independent with initial HEP. Baseline: HEP initiated Goal status: MET  LONG TERM GOALS: Target date: 03/12/2024    Patient to be independent with advanced HEP. Baseline: Not yet initiated  Goal status: IN PROGRESS, 12/31/2023  Patient to demonstrate gait speed of at least 2.0 ft/sec in order to improve access to community.  Baseline: 1.87 ft/sec 12/31/2023; 12.09 sec with RW (2.7 ft/sec) 01/30/24 Goal status: MET  01/30/24  Patient to report tolerance for grocery trip without fatigue limiting.    Baseline: unable; She has been able to tolerate short trips lasting ~10 minutes with her walker but recently went to [employer] at tried a long trip and it felt like it was too much. 01/30/24 Goal  status: IN PROGRESS, 01/30/24  6 min walk test to improve to >600 ft with device as needed Baseline: 437 ft>710 ft in 6 MWT 12/17/23 Goal status: MET 12/17/2023  Patient to demonstrate 5xSTS test in <15 sec in order to decrease risk of falls.  Baseline: 26.23 sec pushing off knees and audible R knee crepitus; 45.41 seconds with arms crossed at chest 5/13; 15.9 sec without UEs 01/30/24 Goal status: IN PROGRESS,  01/30/24  6 min walk test to improve to at least 1,000 ft with device as needed Baseline: 838 feet with RW 01/30/24 Goal status: IN PROGRESS      ASSESSMENT:  CLINICAL IMPRESSION: Pt presents today and continues to report feeling better. Skilled PT session focused on balance activities, with short distance gait between activities without device.  Vitals remain WFL throughout session.  Pt needs light UE support for balance activities, but is able to lessen support with cues. Pt will continue to benefit from skilled PT towards goals for improved functional mobility and decreased fall risk.   OBJECTIVE IMPAIRMENTS: Abnormal gait, cardiopulmonary status limiting activity, decreased activity tolerance, decreased balance, difficulty walking, decreased strength, and pain.   PLAN:  PT FREQUENCY: 2x/week for 3 weeks followed by 1x/week for 3 weeks  PT DURATION: -  PLANNED INTERVENTIONS: 97164- PT Re-evaluation, 97110-Therapeutic exercises, 97530- Therapeutic activity, [id number]- Neuromuscular re-education, 97535- Self Care, 29528- Manual therapy, [id number]- Gait training, [phone]- Canalith repositioning, [phone]- Aquatic Therapy, Patient/Family education, Balance training, Stair training, Taping, Dry Needling, Vestibular training, DME instructions, Cryotherapy, and Moist heat  PLAN FOR NEXT SESSION: Continue to progress balance/gait/endurance while monitoring vitals, Exercises to decrease reliance on UE support with gait.     Dessie Flow, PT 02/07/24 12:29 PM Phone: [phone] Fax:  [phone]  [facility] at [facility] [address] [place], [address] [place], [place] [address] Phone # [phone] Fax # [phone]

## 2024-02-10 ENCOUNTER — Telehealth: Payer: Self-pay | Admitting: Cardiology

## 2024-02-10 DIAGNOSIS — I4819 Other persistent atrial fibrillation: Secondary | ICD-10-CM

## 2024-02-10 NOTE — Telephone Encounter (Signed)
 STAT if patient feels like he/she is going to faint   Are you dizzy, lightheaded, or faint now?  No but patient says Saturday she became dizzy and faint. She couldn't walk in a straight line, only to one side. Lasted about 30 seconds. She held onto the kitchen counter until her husband came to assist her. She put sat with her head between her legs and eventually felt better.  Have you passed out?  No   Do you have any other symptoms?  No   Have you checked your HR and BP (record if available)?   BP/HR have been great, per patient. 6/16: 117/78           103/59          119/65  6/17: 121/71           121/75  6/148: 125/68            142/86  6/19: 127/83          114/56  6/20: 114/72          123/68  6/21: before incident 126/74          after incident 134/80          that night 122/65

## 2024-02-10 NOTE — Telephone Encounter (Signed)
 Called pt advised of Providers recommendations: Blood pressure appears to be well controlled. She has follow up scheduled with EP on 02/13/24. For now would continue current medications and discuss any concerns regarding amiodarone  at that visit. Can repeat BMET and digoxin  level prior to EP appointment so this available to Dr. Kennyth. She should have lab work in the afternoon, ideally six hours after her last digoxin  dose. Please review ED precautions with patient.    Thanks, [name], NP   Lab orders placed and released for draw at [facility].  All questions answered.

## 2024-02-10 NOTE — Telephone Encounter (Signed)
 Spoke with pt in regards to near syncopal episode on Saturday.  Pt reports was getting something out of the kitchen when all or a sudden felt like would pass out.  Unable to walk straight body would only go to the left side.  Called husband for assistance.  Feels if husband were not there would have passed out.  Feels that episode is r/t amiodarone .     Pt denies HA, slurred speech, impaired vision, and difficulty speaking.  Has not missed any doses of Eliquis .  Denies feeling heart racing/palpitations.  Pt expressed recently wore a heart monitor was told if amiodarone  didn't help with AF med would be stopped. Pt expresses was in AF majority of time while wearing.  Pt questioning why medication was not stopped. Pt has not had any more episodes since initial occurrence on Saturday.  BP 02/08/24- 126/74-62 (AM) 134/80-80(afternoon) 122/65-63(PM) 02/09/24- 122/84-69 (AM) 114/72-83 (PM) 02/10/24 133/75-60 Advised pt will send to provider to follow up.

## 2024-02-11 ENCOUNTER — Ambulatory Visit

## 2024-02-11 DIAGNOSIS — M6281 Muscle weakness (generalized): Secondary | ICD-10-CM | POA: Diagnosis not present

## 2024-02-11 DIAGNOSIS — R29898 Other symptoms and signs involving the musculoskeletal system: Secondary | ICD-10-CM

## 2024-02-11 DIAGNOSIS — R262 Difficulty in walking, not elsewhere classified: Secondary | ICD-10-CM

## 2024-02-11 DIAGNOSIS — R2689 Other abnormalities of gait and mobility: Secondary | ICD-10-CM

## 2024-02-11 DIAGNOSIS — R2681 Unsteadiness on feet: Secondary | ICD-10-CM | POA: Diagnosis not present

## 2024-02-11 DIAGNOSIS — I4819 Other persistent atrial fibrillation: Secondary | ICD-10-CM | POA: Diagnosis not present

## 2024-02-11 NOTE — Therapy (Signed)
 OUTPATIENT PHYSICAL THERAPY NEURO TREATMENT   Patient Name: Michelle Wilcox MRN: [id number] DOB:05/29/1946, 78 y.o., female Today's Date: 02/11/2024  PCP: Aisha Harvey, MD REFERRING PROVIDER: Christobal Guadalajara, MD      END OF SESSION:  PT End of Session - 02/11/24 1537     Visit Number 21    Number of Visits 27    Date for PT Re-Evaluation 03/12/24    Authorization Type [employer] Medicare    Progress Note Due on Visit 28   PN completed at visit 9   PT Start Time 1535    PT Stop Time 1615    PT Time Calculation (min) 40 min    Equipment Utilized During Treatment Gait belt    Activity Tolerance Patient tolerated treatment well    Behavior During Therapy WFL for tasks assessed/performed   Tearful at times               Past Medical History:  Diagnosis Date   Anxiety    hx of   Asthma    at times   CHF (congestive heart failure) (HCC)    Depression    hx of   Dyspnea    Giant cell arteritis (HCC)    Gouty arthropathy    Hemolytic anemia (HCC)    Hyperlipidemia    Hypertension    Irregular heart beat    extra beat   Migraines    Polymyalgia (HCC)    Polymyalgia rheumatica (HCC)    Temporal arteritis (HCC)    Past Surgical History:  Procedure Laterality Date   BARTHOLIN GLAND CYST EXCISION     non ca   BREAST BIOPSY Right    CARDIOVERSION N/A 11/05/2023   Procedure: CARDIOVERSION;  Surgeon: Sheena Pugh, DO;  Location: [facility];  Service: Cardiovascular;  Laterality: N/A;   CARDIOVERSION N/A 12/19/2023   Procedure: CARDIOVERSION;  Surgeon: Santo Stanly LABOR, MD;  Location: [facility];  Service: Cardiovascular;  Laterality: N/A;   EYE MUSCLE SURGERY     as a child 35 yrs old   KNEE SURGERY  2004   rt knee   LAPAROSCOPIC SPLENECTOMY N/A 06/18/2018   Procedure: LAPAROSCOPIC SPLENECTOMY ERAS PATHWAY;  Surgeon: Mikell Katz, MD;  Location: [facility];  Service: General;  Laterality: N/A;   toncil     TONSILLECTOMY     as a child    TRANSESOPHAGEAL ECHOCARDIOGRAM (CATH LAB) N/A 11/05/2023   Procedure: TRANSESOPHAGEAL ECHOCARDIOGRAM;  Surgeon: Sheena Pugh, DO;  Location: [facility];  Service: Cardiovascular;  Laterality: N/A;   TRANSESOPHAGEAL ECHOCARDIOGRAM (CATH LAB) N/A 12/19/2023   Procedure: TRANSESOPHAGEAL ECHOCARDIOGRAM;  Surgeon: Santo Stanly LABOR, MD;  Location: [facility];  Service: Cardiovascular;  Laterality: N/A;   Patient Active Problem List   Diagnosis Date Noted   Acute on chronic congestive heart failure (HCC) 12/24/2023   Acute on chronic HFrEF (heart failure with reduced ejection fraction) (HCC) 12/21/2023   Atrial fibrillation with rapid ventricular response (HCC) 12/21/2023   Atrial fibrillation, rapid (HCC) 12/21/2023   Pericardial effusion 11/07/2023   Atrial fibrillation with RVR (HCC) 11/05/2023   Borderline abnormal TFTs 11/05/2023   Macrocytosis 11/05/2023   Atrial fibrillation (HCC) 11/04/2023   PMR (polymyalgia rheumatica) (HCC) 11/04/2023   Primary localized osteoarthrosis of multiple sites 06/15/2022   Diastolic dysfunction without heart failure 03/05/2018   DOE (dyspnea on exertion) 03/05/2018   Preop cardiovascular exam 03/05/2018   Mild reactive airways disease 08/04/2017   Volume overload 08/03/2017  Hypokalemia 08/03/2017   Essential hypertension 08/03/2017   Hyperlipidemia 08/03/2017   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   Hypersensitivity reaction 11/20/2016   Drusen (degenerative) of retina, bilateral 05/22/2016   Nuclear cataract 05/22/2016   Retinal hemorrhage of left eye 05/22/2016   Temporal arteritis (HCC) 05/22/2016   Hemolytic anemia (HCC) 03/19/2016   Breast mass, right 07/01/2012    ONSET DATE: 11/04/23  REFERRING DIAG: I48.91 (ICD-10-CM) - Atrial fibrillation with RVR (HCC)  THERAPY DIAG:  Other abnormalities of gait and mobility  Unsteadiness on feet  Muscle weakness (generalized)  Other symptoms and signs involving  the musculoskeletal system  Difficulty in walking, not elsewhere classified  Rationale for Evaluation and Treatment: Rehabilitation  SUBJECTIVE:                                                                                                                                                                                             SUBJECTIVE STATEMENT: Had episode on Saturday of feeling as though she was going to have syncope.  Suspecting maybe an issue of sensitivity to medication.  Notes her BP was fine during events and they have since reconciled some of her BP medication and this seems to have helped (advised to take BP medication based on her readings)   Pt accompanied by: self PERTINENT HISTORY: 12/31/2023:  Pt underwent attempt at cardioversion for a-fib on 12/19/23, which did not last >24 hrs; was hospitalized after ED visit for SOB, CHF (*New orders received for PT resume and treat).  Anxiety, asthma, CHF, depression, gouty arthropathy, anemia, HLD, HTN, migraines, polymyalgia rheumatica, temporal arteritis, R knee surgery   PAIN:  Are you having pain? Yes: NPRS scale: no Pain location: R knee Pain description: sore Aggravating factors: previous fall Relieving factors: knee brace  PRECAUTIONS: Fall; per Cardiac NP's recommendation: goal resting HR is <110bpm. goal heart rate is <135 bpm with exertion.  12/31/2023:  Have HR monitor (will wear for 14 days).  Monitor BP  WEIGHT BEARING RESTRICTIONS: No  FALLS: Has patient fallen in last 6 months? Yes. Number of falls 1  LIVING ENVIRONMENT: Lives with: lives with their spouse who has stage 4 stomach CA (pt reports that she is having to physically assist him some) Lives in: Other townhouse  Stairs: 2 stories, sleeps on 1st floor in recliner  Has following equipment at home: Walker - 2 wheeled, cane  PLOF: Independent; currently getting assistance with meals   PATIENT GOALS: improve fatigue   OBJECTIVE:   TODAY'S TREATMENT:  02/11/24 Activity Comments  vitals 130/78 mmHg, 69 bpm  Sidestepping x 2 min 2#  Stair taps 2x60  sec 2#, brief standing rest break btwn sets  LAQ 2x60 sec 2#, 30 sec rest btwn sets  STS w/ ball toss 2x60 sec 22 seat height, red med ball (15 reps 1st round, 18 reps 2nd) 60 sec rest btwn HR 82 bpm  High knee march 2x60 sec 2# ankle, red med ball hold, 60 sec rest btwn      TODAY'S TREATMENT: 02/06/2024 Activity Comments  Vitals:  94%,HR 75 120/80   NuStep, Level 4-5, 4 extremities x 6 minutes SPM 75 pace  at Level 4  Superset: Step taps 6, 2 x 30 step ups 6 2x30 Fwd step taps>back to runner's stretch, 2x10 each side 1 UE support; brief seated break, 95%, HR 85 bpm  Gait with RW between sets  Mod I, for improved tolerance for walking at work  Superset: Sidestep over obstacle x 30 sec each leg leading Four square step step activity, 30 sec clockwise/counterclockwise 95%, HR 89  Gait between activities with no device, supervision      TODAY'S TREATMENT: 02/04/24 Activity Comments  Vitals at start of session 129/68 mmHg, 68bpm  Nustep L5-6 x 6 min UE/LEs  Cueing for pacing d/t increased resistance today   Superset: step ups 6 2x30 fwd/back stepping 2x10 each side With UE support. Some difficulty coordinating alt steps   gait training with RW for 1 minute in between supersets   In order to improve walking tolerance at work  Superset: Alt side step step over 1/2 foam roll 2x30 each LE with 3# standing march #3 2x30 Requires UE support       Access Code: [email or web address] URL: [email or web address] Date: 01/16/2024 Prepared by: [facility] [address] - Outpatient  Rehab - [facility]  Program Notes Seated step out and in, each leg, 3 x 10 reps  Exercises - Seated March  - 1 x daily - 7 x weekly - 3 sets - 10 reps - Seated Long Arc Quad  - 1 x daily - 7 x weekly - 3 sets - 10 reps - Seated Ankle Dorsiflexion AROM  - 1 x daily - 7 x weekly - 3 sets - 10 reps - Seated  Hip Abduction with Resistance  - 1 x daily - 7 x weekly - 3 sets - 10 reps - Standing Hamstring Curl with Chair Support  - 1 x daily - 7 x weekly - 3 sets - 10-15 reps - Heel Toe Raises with Counter Support  - 1 x daily - 7 x weekly - 2-3 sets - 10 reps - Standing March with Counter Support  - 1 x daily - 7 x weekly - 2-3 sets - 10 reps    ______________________________________________ Note: Objective measures were completed at Evaluation unless otherwise noted.  Vitals: 96% spO2, 97 bpm (102 bpm taken manually),  115/85 mmHg  DIAGNOSTIC FINDINGS: 11/05/23 echo TEE: Left Atrium: Left atrial size was severely dilated. A left atrial/left  atrial appendage thrombus was detected.   COGNITION: Overall cognitive status: Within functional limits for tasks assessed   SENSATION: WFL  POSTURE: rounded shoulders and forward head  LOWER EXTREMITY MMT:   MMT (in sitting) Right Eval Left Eval  Hip flexion 4+ 4+  Hip extension    Hip abduction 4+ 4+  Hip adduction 4+ 4+  Hip internal rotation    Hip external rotation    Knee flexion 4+ 4+  Knee extension 4+ 4  Ankle dorsiflexion 4+ 4+  Ankle plantarflexion 4 4  Ankle inversion    Ankle eversion    (  Blank rows = not tested)  GAIT: Gait pattern: slow gait speed, pushing walker too far forward Assistive device utilized: Walker - 2 wheeled Level of assistance: SBA  FUNCTIONAL TESTS:  5xSTS: 26.23 sec pushing off knees and audible R knee crepitus  Pre: 97% spO2, 89bpm  Post: 95% spO2, 102bpm   30M walk test: 17.53 sec with RW (1.87 ft/sec)                                                                                                                 __________________________________________________________________________________ GOALS: Goals reviewed with patient? Yes  SHORT TERM GOALS: Target date: 12/05/2023  Patient to be independent with initial HEP. Baseline: HEP initiated Goal status: MET  LONG TERM GOALS: Target date:  03/12/2024    Patient to be independent with advanced HEP. Baseline: Not yet initiated  Goal status: IN PROGRESS, 12/31/2023  Patient to demonstrate gait speed of at least 2.0 ft/sec in order to improve access to community.  Baseline: 1.87 ft/sec 12/31/2023; 12.09 sec with RW (2.7 ft/sec) 01/30/24 Goal status: MET  01/30/24  Patient to report tolerance for grocery trip without fatigue limiting.    Baseline: unable; She has been able to tolerate short trips lasting ~10 minutes with her walker but recently went to [employer] at tried a long trip and it felt like it was too much. 01/30/24 Goal status: IN PROGRESS, 01/30/24  6 min walk test to improve to >600 ft with device as needed Baseline: 437 ft>710 ft in 6 MWT 12/17/23 Goal status: MET 12/17/2023  Patient to demonstrate 5xSTS test in <15 sec in order to decrease risk of falls.  Baseline: 26.23 sec pushing off knees and audible R knee crepitus; 45.41 seconds with arms crossed at chest 5/13; 15.9 sec without UEs 01/30/24 Goal status: IN PROGRESS,  01/30/24  6 min walk test to improve to at least 1,000 ft with device as needed Baseline: 838 feet with RW 01/30/24 Goal status: IN PROGRESS      ASSESSMENT:  CLINICAL IMPRESSION: Continued with training to improve endurance/functional activity tolerance. Activities performed against light resistance with increased work times from previous sessions to good tolerance with 30-60 sec rest periods due to fatigue.  Overall, good tolerance to activities with rest periods and demo improved endurance/tolerance from past sessions.  Pt notes improved confidence/balance by ability to perform standing activities w/out UE support. Continued sessions to advance POC details to improve mobility  OBJECTIVE IMPAIRMENTS: Abnormal gait, cardiopulmonary status limiting activity, decreased activity tolerance, decreased balance, difficulty walking, decreased strength, and pain.   PLAN:  PT FREQUENCY: 2x/week for 3  weeks followed by 1x/week for 3 weeks  PT DURATION: -  PLANNED INTERVENTIONS: [id number]- PT Re-evaluation, 97110-Therapeutic exercises, 97530- Therapeutic activity, [id number]- Neuromuscular re-education, 97535- Self Care, [id number]- Manual therapy, [id number]- Gait training, [phone]- Canalith repositioning, [phone]- Aquatic Therapy, Patient/Family education, Balance training, Stair training, Taping, Dry Needling, Vestibular training, DME instructions, Cryotherapy, and Moist heat  PLAN FOR NEXT SESSION: Continue to progress balance/gait/endurance while monitoring vitals, Exercises  to decrease reliance on UE support with gait.     5:17 PM, 02/11/24 M. Kelly Rishit Burkhalter, PT, DPT Physical Therapist- [place] Office Number: [phone]

## 2024-02-12 ENCOUNTER — Ambulatory Visit: Payer: Self-pay | Admitting: Cardiology

## 2024-02-12 LAB — BASIC METABOLIC PANEL WITH GFR
BUN/Creatinine Ratio: 12 (ref 12–28)
BUN: 10 mg/dL (ref 8–27)
CO2: 25 mmol/L (ref 20–29)
Calcium: 9.4 mg/dL (ref 8.7–10.3)
Chloride: 100 mmol/L (ref 96–106)
Creatinine, Ser: 0.83 mg/dL (ref 0.57–1.00)
Glucose: 84 mg/dL (ref 70–99)
Potassium: 5.1 mmol/L (ref 3.5–5.2)
Sodium: 140 mmol/L (ref 134–144)
eGFR: 72 mL/min/{1.73_m2} (ref >59)

## 2024-02-12 LAB — DIGOXIN LEVEL: Digoxin, Serum: 1.1 ng/mL — ABNORMAL HIGH (ref 0.5–0.9)

## 2024-02-12 NOTE — Therapy (Signed)
 OUTPATIENT PHYSICAL THERAPY NEURO TREATMENT   Patient Name: Michelle Wilcox MRN: [id number] DOB:1946/08/09, 78 y.o., female Today's Date: 02/14/2024  PCP: Aisha Harvey, MD REFERRING PROVIDER: Christobal Guadalajara, MD      END OF SESSION:  PT End of Session - 02/14/24 1059     Visit Number 22    Number of Visits 27    Date for PT Re-Evaluation 03/12/24    Authorization Type BCBS Medicare    Progress Note Due on Visit 28   PN completed at visit 9   PT Start Time 1017    PT Stop Time 1059    PT Time Calculation (min) 42 min    Equipment Utilized During Treatment Gait belt    Activity Tolerance Patient tolerated treatment well    Behavior During Therapy WFL for tasks assessed/performed   Tearful at times                Past Medical History:  Diagnosis Date   Anxiety    hx of   Asthma    at times   CHF (congestive heart failure) (HCC)    Depression    hx of   Dyspnea    Giant cell arteritis (HCC)    Gouty arthropathy    Hemolytic anemia (HCC)    Hyperlipidemia    Hypertension    Irregular heart beat    extra beat   Migraines    Polymyalgia (HCC)    Polymyalgia rheumatica (HCC)    Temporal arteritis (HCC)    Past Surgical History:  Procedure Laterality Date   BARTHOLIN GLAND CYST EXCISION     non ca   BREAST BIOPSY Right    CARDIOVERSION N/A 11/05/2023   Procedure: CARDIOVERSION;  Surgeon: Sheena Pugh, DO;  Location: [facility];  Service: Cardiovascular;  Laterality: N/A;   CARDIOVERSION N/A 12/19/2023   Procedure: CARDIOVERSION;  Surgeon: Santo Stanly LABOR, MD;  Location: [facility];  Service: Cardiovascular;  Laterality: N/A;   EYE MUSCLE SURGERY     as a child 43 yrs old   KNEE SURGERY  2004   rt knee   LAPAROSCOPIC SPLENECTOMY N/A 06/18/2018   Procedure: LAPAROSCOPIC SPLENECTOMY ERAS PATHWAY;  Surgeon: Mikell Katz, MD;  Location: [facility];  Service: General;  Laterality: N/A;   toncil     TONSILLECTOMY     as a child    TRANSESOPHAGEAL ECHOCARDIOGRAM (CATH LAB) N/A 11/05/2023   Procedure: TRANSESOPHAGEAL ECHOCARDIOGRAM;  Surgeon: Sheena Pugh, DO;  Location: [facility];  Service: Cardiovascular;  Laterality: N/A;   TRANSESOPHAGEAL ECHOCARDIOGRAM (CATH LAB) N/A 12/19/2023   Procedure: TRANSESOPHAGEAL ECHOCARDIOGRAM;  Surgeon: Santo Stanly LABOR, MD;  Location: [facility];  Service: Cardiovascular;  Laterality: N/A;   Patient Active Problem List   Diagnosis Date Noted   Acute on chronic congestive heart failure (HCC) 12/24/2023   Acute on chronic HFrEF (heart failure with reduced ejection fraction) (HCC) 12/21/2023   Atrial fibrillation with rapid ventricular response (HCC) 12/21/2023   Atrial fibrillation, rapid (HCC) 12/21/2023   Pericardial effusion 11/07/2023   Atrial fibrillation with RVR (HCC) 11/05/2023   Borderline abnormal TFTs 11/05/2023   Macrocytosis 11/05/2023   Atrial fibrillation (HCC) 11/04/2023   PMR (polymyalgia rheumatica) (HCC) 11/04/2023   Primary localized osteoarthrosis of multiple sites 06/15/2022   Diastolic dysfunction without heart failure 03/05/2018   DOE (dyspnea on exertion) 03/05/2018   Preop cardiovascular exam 03/05/2018   Mild reactive airways disease 08/04/2017   Volume overload  08/03/2017   Hypokalemia 08/03/2017   Essential hypertension 08/03/2017   Hyperlipidemia 08/03/2017   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   Hypersensitivity reaction 11/20/2016   Drusen (degenerative) of retina, bilateral 05/22/2016   Nuclear cataract 05/22/2016   Retinal hemorrhage of left eye 05/22/2016   Temporal arteritis (HCC) 05/22/2016   Hemolytic anemia (HCC) 03/19/2016   Breast mass, right 07/01/2012    ONSET DATE: 11/04/23  REFERRING DIAG: I48.91 (ICD-10-CM) - Atrial fibrillation with RVR (HCC)  THERAPY DIAG:  Other abnormalities of gait and mobility  Unsteadiness on feet  Muscle weakness (generalized)  Other symptoms and signs involving  the musculoskeletal system  Rationale for Evaluation and Treatment: Rehabilitation  SUBJECTIVE:                                                                                                                                                                                             SUBJECTIVE STATEMENT: Saw the electrophysiologist- digoxin  levels were high and they stopped the amiodarone . Feeling pretty good. Reports that she has not been walking outside d/t the heat.   Pt accompanied by: self PERTINENT HISTORY: 12/31/2023:  Pt underwent attempt at cardioversion for a-fib on 12/19/23, which did not last >24 hrs; was hospitalized after ED visit for SOB, CHF (*New orders received for PT resume and treat).  Anxiety, asthma, CHF, depression, gouty arthropathy, anemia, HLD, HTN, migraines, polymyalgia rheumatica, temporal arteritis, R knee surgery   PAIN:  Are you having pain? Yes: NPRS scale: no Pain location: R knee Pain description: sore Aggravating factors: previous fall Relieving factors: knee brace  PRECAUTIONS: Fall; per Cardiac NP's recommendation: goal resting HR is <110bpm. goal heart rate is <135 bpm with exertion.  12/31/2023:  Have HR monitor (will wear for 14 days).  Monitor BP  WEIGHT BEARING RESTRICTIONS: No  FALLS: Has patient fallen in last 6 months? Yes. Number of falls 1  LIVING ENVIRONMENT: Lives with: lives with their spouse who has stage 4 stomach CA (pt reports that she is having to physically assist him some) Lives in: Other townhouse  Stairs: 2 stories, sleeps on 1st floor in recliner  Has following equipment at home: Environmental consultant - 2 wheeled, cane  PLOF: Independent; currently getting assistance with meals   PATIENT GOALS: improve fatigue   OBJECTIVE:     TODAY'S TREATMENT: 02/14/24 Activity Comments  Vitals at start of session 130/80 mmHg, 67 bpm   nustep L6x 6 min UEs/LE Reports I can feel it in my R knee but not bad  gait training with quad tip cane,  stair navigation with cane Edu and practiced proper sequencing. Pt able to  carryover well. Prefers cane in R hand   superset: STS chop/reverse chop w/ red medball 10x Limited eccentric control      HOME EXERCISE PROGRAM Last updated: 02/14/24 Access Code: [email or web address] URL: [email or web address] Date: 02/14/2024 Prepared by: [facility]  Program Notes you can get a pair of adjustable ankle weights ranging from 1-5 lbs from [email or web address]   Exercises - Sit to Stand with Arms Crossed  - 1 x daily - 5 x weekly - 2 sets - 10 reps - Standing Hamstring Curl with Chair Support  - 1 x daily - 7 x weekly - 3 sets - 10-15 reps - Standing March with Counter Support  - 1 x daily - 7 x weekly - 2-3 sets - 1 min hold - Side Stepping with Counter Support  - 1 x daily - 5 x weekly - 2 sets - 1 min hold - Forward Step Up  - 1 x daily - 5 x weekly - 2-3 sets - 30 sec hold   PATIENT EDUCATION: Education details: HEP update, recommended consistent performance for max benefit , handout on safe stair sequencing with cane  Person educated: Patient Education method: Explanation, Demonstration, Tactile cues, Verbal cues, and Handouts Education comprehension: verbalized understanding and returned demonstration    ______________________________________________ Note: Objective measures were completed at Evaluation unless otherwise noted.  Vitals: 96% spO2, 97 bpm (102 bpm taken manually),  115/85 mmHg  DIAGNOSTIC FINDINGS: 11/05/23 echo TEE: Left Atrium: Left atrial size was severely dilated. A left atrial/left  atrial appendage thrombus was detected.   COGNITION: Overall cognitive status: Within functional limits for tasks assessed   SENSATION: WFL  POSTURE: rounded shoulders and forward head  LOWER EXTREMITY MMT:   MMT (in sitting) Right Eval Left Eval  Hip flexion 4+ 4+  Hip extension    Hip abduction 4+ 4+  Hip adduction 4+ 4+  Hip internal  rotation    Hip external rotation    Knee flexion 4+ 4+  Knee extension 4+ 4  Ankle dorsiflexion 4+ 4+  Ankle plantarflexion 4 4  Ankle inversion    Ankle eversion    (Blank rows = not tested)  GAIT: Gait pattern: slow gait speed, pushing walker too far forward Assistive device utilized: Walker - 2 wheeled Level of assistance: SBA  FUNCTIONAL TESTS:  5xSTS: 26.23 sec pushing off knees and audible R knee crepitus  Pre: 97% spO2, 89bpm  Post: 95% spO2, 102bpm   44M walk test: 17.53 sec with RW (1.87 ft/sec)                                                                                                                 __________________________________________________________________________________ GOALS: Goals reviewed with patient? Yes  SHORT TERM GOALS: Target date: 12/05/2023  Patient to be independent with initial HEP. Baseline: HEP initiated Goal status: MET  LONG TERM GOALS: Target date: 03/12/2024    Patient to be independent with advanced HEP. Baseline: Not yet initiated  Goal status: IN  PROGRESS, 12/31/2023  Patient to demonstrate gait speed of at least 2.0 ft/sec in order to improve access to community.  Baseline: 1.87 ft/sec 12/31/2023; 12.09 sec with RW (2.7 ft/sec) 01/30/24 Goal status: MET  01/30/24  Patient to report tolerance for grocery trip without fatigue limiting.    Baseline: unable; She has been able to tolerate short trips lasting ~10 minutes with her walker but recently went to [employer] at tried a long trip and it felt like it was too much. 01/30/24 Goal status: IN PROGRESS, 01/30/24  6 min walk test to improve to >600 ft with device as needed Baseline: 437 ft>710 ft in 6 MWT 12/17/23 Goal status: MET 12/17/2023  Patient to demonstrate 5xSTS test in <15 sec in order to decrease risk of falls.  Baseline: 26.23 sec pushing off knees and audible R knee crepitus; 45.41 seconds with arms crossed at chest 5/13; 15.9 sec without UEs 01/30/24 Goal status:  IN PROGRESS,  01/30/24  6 min walk test to improve to at least 1,000 ft with device as needed Baseline: 838 feet with RW 01/30/24 Goal status: IN PROGRESS      ASSESSMENT:  CLINICAL IMPRESSION: Patient arrived to session with report of some medication changes since last session, otherwise without complaints. Gait training with quad tip cane was performed today to improve balance confidence and reduce reliance on UE support for community. Worked on updating HEP for increased challenge and consistent fitness regimen at home. Patient tolerated session well and without complaints upon leaving.   OBJECTIVE IMPAIRMENTS: Abnormal gait, cardiopulmonary status limiting activity, decreased activity tolerance, decreased balance, difficulty walking, decreased strength, and pain.   PLAN:  PT FREQUENCY: 2x/week for 3 weeks followed by 1x/week for 3 weeks  PT DURATION: -  PLANNED INTERVENTIONS: [id number]- PT Re-evaluation, 97110-Therapeutic exercises, 97530- Therapeutic activity, [id number]- Neuromuscular re-education, 97535- Self Care, [id number]- Manual therapy, [id number]- Gait training, [phone]- Canalith repositioning, [phone]- Aquatic Therapy, Patient/Family education, Balance training, Stair training, Taping, Dry Needling, Vestibular training, DME instructions, Cryotherapy, and Moist heat  PLAN FOR NEXT SESSION: Continue to progress balance/gait/endurance while monitoring vitals, Exercises to decrease reliance on UE support with gait.     Louana Terrilyn Christians, PT, DPT 02/14/24 11:02 AM  [facility] [address] [place], [address] [place], [place] [address] Phone # [phone] Fax # [phone]

## 2024-02-13 ENCOUNTER — Telehealth: Payer: Self-pay | Admitting: Cardiology

## 2024-02-13 ENCOUNTER — Ambulatory Visit: Attending: Cardiology | Admitting: Cardiology

## 2024-02-13 ENCOUNTER — Encounter: Payer: Self-pay | Admitting: Cardiology

## 2024-02-13 VITALS — BP 130/62 | HR 72 | Ht 63.0 in | Wt 179.0 lb

## 2024-02-13 DIAGNOSIS — I10 Essential (primary) hypertension: Secondary | ICD-10-CM | POA: Diagnosis not present

## 2024-02-13 DIAGNOSIS — I4819 Other persistent atrial fibrillation: Secondary | ICD-10-CM

## 2024-02-13 DIAGNOSIS — D6869 Other thrombophilia: Secondary | ICD-10-CM | POA: Diagnosis not present

## 2024-02-13 MED ORDER — METOPROLOL SUCCINATE ER 50 MG PO TB24: 75.0000 mg | ORAL_TABLET | Freq: Two times a day (BID) | ORAL | 3 refills | Status: AC

## 2024-02-13 NOTE — Telephone Encounter (Signed)
-----   Message from Katlyn D West sent at 02/12/2024  7:08 AM EDT ----- Please let Ms. Koppelman know that her kidney function is normal and her electrolytes are normal. Her digoxin  was minimally elevated, recommend she hold her digoxin  for the next two days. Follow up  with Dr. Kennyth as planned.  ----- Message ----- From: Rebecka Memos Lab Results In Sent: 02/12/2024   4:36 AM EDT To: Katlyn D West, NP

## 2024-02-13 NOTE — Telephone Encounter (Signed)
 Called patient advised of below they verbalized understanding.

## 2024-02-13 NOTE — Telephone Encounter (Signed)
 Left message to call back

## 2024-02-13 NOTE — Patient Instructions (Signed)
 Medication Instructions:  Your physician has recommended you make the following change in your medication:  1) STOP taking amiodarone   2) INCREASE Toprol  XL (metoprolol  succinate) to 75 mg twice daily   *If you need a refill on your cardiac medications before your next appointment, please call your pharmacy*  Testing/Procedures: Ablation Your physician has recommended that you have an ablation. Catheter ablation is a medical procedure used to treat some cardiac arrhythmias (irregular heartbeats). During catheter ablation, a long, thin, flexible tube is put into a blood vessel in your groin (upper thigh), or neck. This tube is called an ablation catheter. It is then guided to your heart through the blood vessel. Radio frequency waves destroy small areas of heart tissue where abnormal heartbeats may cause an arrhythmia to start.  Please call us  if you would like to proceed with scheduling at [phone].   Follow-Up: At [facility], you and your health needs are our priority.  As part of our continuing mission to provide you with exceptional heart care, our providers are all part of one team.  This team includes your primary Cardiologist (physician) and Advanced Practice Providers or APPs (Physician Assistants and Nurse Practitioners) who all work together to provide you with the care you need, when you need it.

## 2024-02-13 NOTE — Telephone Encounter (Signed)
 Talked with Michelle Wilcox and made her aware of med changes and list is already updated.

## 2024-02-13 NOTE — Telephone Encounter (Signed)
 Pt c/o medication issue:  1. Name of Medication:   metoprolol  succinate (TOPROL -XL) 50 MG 24 hr tablet    2. How are you currently taking this medication (dosage and times per day)? As written  3. Are you having a reaction (difficulty breathing--STAT)? No  4. What is your medication issue? Patient would like to make [name], NP aware that Dr. Kennyth is having her stop the Amiodarone  and double the dosage for her Metoprolol  from her visit today.

## 2024-02-13 NOTE — Progress Notes (Signed)
 Electrophysiology Office Note:   Date:  02/14/2024  ID:  [name], Dawn 05-26-1946, MRN [id number]  Primary Cardiologist: Alm Clay, MD Electrophysiologist: Fonda Kitty, MD      History of Present Illness:   Michelle Wilcox is a 78 y.o. female with h/o AF, HTN, HLD, PMR, Giant Cell Arteritis, autoimmune hemolytic anemia s/p splenectomy on prednisone , asthma, anxiety who is being seen today for follow up management of her AF.  Discussed the use of AI scribe software for clinical note transcription with the patient, who gave verbal consent to proceed.  History of Present Illness Patient was admitted in 10/2023 for [facility] after a cortisone shot in her knee.  During that admission, she was planned for cardioversion but TEE showed left atrial appendage thrombus.  She was started on amiodarone , eliquis , metoprolol  and digoxin . The patient presented on 12/20/23 for TEE with DCCV.  LVEF was 65% and no further thrombus noted, LA/RA severely dilated with mod-sev functional MR.  She cardioverted with shock x1 at 200j. Patient felt well postprocedure and went to [facility] shopping when she suddenly began to have palpitations and progressive shortness of breath.  She went home and did not recover prompting ER visit.  In the emergency room she was noted to be in A-fib with RVR.  She was hypoxemic requiring BiPAP.  CXR showed cardiomegaly, vascular congestion and small bilateral pleural effusions.  She was started on amiodarone , treated with IV Lasix  and digoxin .   She was discharged on amiodarone . Wore a 2 week Zio which showed 100% AF burden. Despite this, she reports doing relatively well. Blood pressure and heart rate have been stable. No recent chest pain and no significant swelling in her legs.   She had an allergic reaction characterized by swollen and itchy earlobes and scalp, suspected to be related to an increased dosage of furosemide . After reducing the dosage back to 20 mg, the  allergic symptoms resolved, and her blood pressure stabilized, allowing her to resume her blood pressure medication as needed. She notes improved energy levels since her blood pressure stabilized.    Review of systems complete and found to be negative unless listed in HPI.   EP Information / Studies Reviewed:    EKG is not ordered today. EKG from 01/10/24 reviewed which showed AF.      Echo 12/22/23:  1. Left ventricular ejection fraction, by estimation, is 60 to 65%. The  left ventricle has normal function. The left ventricle has no regional  wall motion abnormalities. Left ventricular diastolic parameters are  indeterminate.   2. Right ventricular systolic function is normal. The right ventricular  size is normal. Tricuspid regurgitation signal is inadequate for assessing  PA pressure.   3. Left atrial size was moderately dilated.   4. The mitral valve is normal in structure. Trivial mitral valve  regurgitation. No evidence of mitral stenosis.   5. The aortic valve is tricuspid. Aortic valve regurgitation is mild to  moderate. No aortic stenosis is present.   6. The inferior vena cava is dilated in size with >50% respiratory  variability, suggesting right atrial pressure of 8 mmHg.   Zio Monitor 01/17/24:  Patch Wear Time:  13 days and 23 hours (2025-05-09T15:42:31-0400 to 2025-05-23T15:42:23-0400)   1 run of Ventricular Tachycardia occurred lasting 5 beats with a max rate of 138 bpm (avg 115 bpm). Atrial Fibrillation occurred continuously (100% burden), ranging from 42-161 bpm (avg of 74 bpm). Isolated VEs were occasional (1.1%, 16470), VE Couplets  were  rare (<1.0%, 10), and no VE Triplets were present. Ventricular Bigeminy and Trigeminy were present.   Physical Exam:   VS:  BP 130/62   Pulse 72   Ht 5' 3 (1.6 m)   Wt 179 lb (81.2 kg)   SpO2 93%   BMI 31.71 kg/m    Wt Readings from Last 3 Encounters:  02/13/24 179 lb (81.2 kg)  01/29/24 181 lb (82.1 kg)  01/10/24 175 lb  (79.4 kg)     GEN: Well nourished, well developed in no acute distress NECK: No JVD CARDIAC: Normal rate, irregular rhythm RESPIRATORY:  Clear to auscultation without rales, wheezing or rhonchi  ABDOMEN: Soft, non-distended EXTREMITIES:  No edema; No deformity   ASSESSMENT AND PLAN:      #Persistent atrial fibrillation: Had cardioversion with ERAF. Noted severe LA dilatation on TEE with mod-sev MR. Recent Zio with 100% AF burden despite amiodarone . She is currently rate controlled. #Secondary hypercoagulable state due to AF: -Discussed treatment options today for AF including ablation. She has failed amiodarone  and other AADs are unlikely to be successful. Risk, benefits, and alternatives to EP study and ablation for afib were discussed. These risks include but are not limited to stroke, bleeding, vascular damage, tamponade, perforation, damage to the esophagus, lungs, phrenic nerve and other structures, pulmonary vein stenosis, worsening renal function, coronary vasospasm and death.  Discussed potential need for repeat ablation procedures and antiarrhythmic drugs after an initial ablation. I quoted her a chance of success with ablation that is less than average patient. The patient understands these risk and wishes to think about her options. If ablation not pursued then she will be permanent AF. - Stop amiodarone  200 mg once daily to avoid long term risks, given that this medication is not working to maintain sinus - AF burden 100% on Zio.  - Increase Toprol  to 75 mg once daily. - Continue digoxin . - Continue Eliquis  5 mg twice daily for now.  Patient's longstanding steroid use also makes her high risk for Watchman device implant.  Risks and benefits of anticoagulation were discussed.  #Hypertension -At goal today.  Recommend checking blood pressures 1-2 times per week at home and recording the values.  Recommend bringing these recordings to the primary care physician.   Signed, Fonda Kitty, MD

## 2024-02-13 NOTE — Telephone Encounter (Signed)
 Patient is returning phone call.

## 2024-02-14 ENCOUNTER — Ambulatory Visit: Admitting: Physical Therapy

## 2024-02-14 DIAGNOSIS — R2681 Unsteadiness on feet: Secondary | ICD-10-CM

## 2024-02-14 DIAGNOSIS — M6281 Muscle weakness (generalized): Secondary | ICD-10-CM

## 2024-02-14 DIAGNOSIS — R29898 Other symptoms and signs involving the musculoskeletal system: Secondary | ICD-10-CM | POA: Diagnosis not present

## 2024-02-14 DIAGNOSIS — R2689 Other abnormalities of gait and mobility: Secondary | ICD-10-CM

## 2024-02-14 DIAGNOSIS — R262 Difficulty in walking, not elsewhere classified: Secondary | ICD-10-CM | POA: Diagnosis not present

## 2024-02-14 NOTE — Patient Instructions (Signed)
To go up stairs or curb with cane: L leg ? R leg ? cane   To go down stairs or curb with cane:  Cane ? R leg ? L leg

## 2024-02-17 DIAGNOSIS — I5032 Chronic diastolic (congestive) heart failure: Secondary | ICD-10-CM | POA: Diagnosis not present

## 2024-02-17 DIAGNOSIS — I5021 Acute systolic (congestive) heart failure: Secondary | ICD-10-CM | POA: Diagnosis not present

## 2024-02-17 DIAGNOSIS — I4891 Unspecified atrial fibrillation: Secondary | ICD-10-CM | POA: Diagnosis not present

## 2024-02-17 DIAGNOSIS — I10 Essential (primary) hypertension: Secondary | ICD-10-CM | POA: Diagnosis not present

## 2024-02-18 ENCOUNTER — Encounter: Payer: Self-pay | Admitting: Physical Therapy

## 2024-02-18 ENCOUNTER — Ambulatory Visit: Attending: Internal Medicine | Admitting: Physical Therapy

## 2024-02-18 DIAGNOSIS — R262 Difficulty in walking, not elsewhere classified: Secondary | ICD-10-CM | POA: Insufficient documentation

## 2024-02-18 DIAGNOSIS — M6281 Muscle weakness (generalized): Secondary | ICD-10-CM | POA: Diagnosis not present

## 2024-02-18 DIAGNOSIS — R2689 Other abnormalities of gait and mobility: Secondary | ICD-10-CM | POA: Diagnosis not present

## 2024-02-18 DIAGNOSIS — R29898 Other symptoms and signs involving the musculoskeletal system: Secondary | ICD-10-CM | POA: Insufficient documentation

## 2024-02-18 DIAGNOSIS — R2681 Unsteadiness on feet: Secondary | ICD-10-CM | POA: Insufficient documentation

## 2024-02-18 NOTE — Therapy (Signed)
 OUTPATIENT PHYSICAL THERAPY NEURO TREATMENT   Patient Name: Michelle Wilcox MRN: [id number] DOB:[birth year], 78 y.o., female Today's Date: 02/18/2024  PCP: Aisha Harvey, MD REFERRING PROVIDER: Christobal Guadalajara, MD      END OF SESSION:  PT End of Session - 02/18/24 1537     Visit Number 23    Number of Visits 27    Date for PT Re-Evaluation 03/12/24    Authorization Type BCBS Medicare    Progress Note Due on Visit 28    PT Start Time 1535    PT Stop Time 1615    PT Time Calculation (min) 40 min    Equipment Utilized During Treatment Gait belt    Activity Tolerance Patient tolerated treatment well    Behavior During Therapy WFL for tasks assessed/performed                  Past Medical History:  Diagnosis Date   Anxiety    hx of   Asthma    at times   CHF (congestive heart failure) (HCC)    Depression    hx of   Dyspnea    Giant cell arteritis (HCC)    Gouty arthropathy    Hemolytic anemia (HCC)    Hyperlipidemia    Hypertension    Irregular heart beat    extra beat   Migraines    Polymyalgia (HCC)    Polymyalgia rheumatica (HCC)    Temporal arteritis (HCC)    Past Surgical History:  Procedure Laterality Date   BARTHOLIN GLAND CYST EXCISION     non ca   BREAST BIOPSY Right    CARDIOVERSION N/A 11/05/2023   Procedure: CARDIOVERSION;  Surgeon: Sheena Pugh, DO;  [facility]: [facility];  Service: Cardiovascular;  Laterality: N/A;   CARDIOVERSION N/A 12/19/2023   Procedure: CARDIOVERSION;  Surgeon: Santo Stanly LABOR, MD;  Location: [facility];  Service: Cardiovascular;  Laterality: N/A;   EYE MUSCLE SURGERY     as a child 1 yrs old   KNEE SURGERY  2004   rt knee   LAPAROSCOPIC SPLENECTOMY N/A 06/18/2018   Procedure: LAPAROSCOPIC SPLENECTOMY ERAS PATHWAY;  Surgeon: Mikell Katz, MD;  Location: [facility];  Service: General;  Laterality: N/A;   toncil     TONSILLECTOMY     as a child   TRANSESOPHAGEAL ECHOCARDIOGRAM (CATH  LAB) N/A 11/05/2023   Procedure: TRANSESOPHAGEAL ECHOCARDIOGRAM;  Surgeon: Sheena Pugh, DO;  [facility];  Service: Cardiovascular;  Laterality: N/A;   TRANSESOPHAGEAL ECHOCARDIOGRAM (CATH LAB) N/A 12/19/2023   Procedure: TRANSESOPHAGEAL ECHOCARDIOGRAM;  Surgeon: Santo Stanly LABOR, MD;  [facility];  Service: Cardiovascular;  Laterality: N/A;   Patient Active Problem List   Diagnosis Date Noted   Acute on chronic congestive heart failure (HCC) 12/24/2023   Acute on chronic HFrEF (heart failure with reduced ejection fraction) (HCC) 12/21/2023   Atrial fibrillation with rapid ventricular response (HCC) 12/21/2023   Atrial fibrillation, rapid (HCC) 12/21/2023   Pericardial effusion 11/07/2023   Atrial fibrillation with RVR (HCC) 11/05/2023   Borderline abnormal TFTs 11/05/2023   Macrocytosis 11/05/2023   Atrial fibrillation (HCC) 11/04/2023   PMR (polymyalgia rheumatica) (HCC) 11/04/2023   Primary localized osteoarthrosis of multiple sites 06/15/2022   Diastolic dysfunction without heart failure 03/05/2018   DOE (dyspnea on exertion) 03/05/2018   Preop cardiovascular exam 03/05/2018   Mild reactive airways disease 08/04/2017   Volume overload 08/03/2017   Hypokalemia 08/03/2017   Essential hypertension  08/03/2017   Hyperlipidemia 08/03/2017   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   Hypersensitivity reaction 11/20/2016   Drusen (degenerative) of retina, bilateral 05/22/2016   Nuclear cataract 05/22/2016   Retinal hemorrhage of left eye 05/22/2016   Temporal arteritis (HCC) 05/22/2016   Hemolytic anemia (HCC) 03/19/2016   Breast mass, right 07/01/2012    ONSET DATE: 11/04/23  REFERRING DIAG: I48.91 (ICD-10-CM) - Atrial fibrillation with RVR (HCC)  THERAPY DIAG:  Other abnormalities of gait and mobility  Unsteadiness on feet  Muscle weakness (generalized)  Rationale for Evaluation and Treatment: Rehabilitation  SUBJECTIVE:                                                                                                                                                                                              SUBJECTIVE STATEMENT: Saw the electrophysiologist- they are recommending the cardio ablation-not sure about that.  Finding that I can stand up longer before I sit down.  Brought in my cane/walking pole today and I'm trying to use it correctly.  Pt accompanied by: self PERTINENT HISTORY: 12/31/2023:  Pt underwent attempt at cardioversion for a-fib on 12/19/23, which did not last >24 hrs; was hospitalized after ED visit for SOB, CHF (*New orders received for PT resume and treat).  Anxiety, asthma, CHF, depression, gouty arthropathy, anemia, HLD, HTN, migraines, polymyalgia rheumatica, temporal arteritis, R knee surgery   PAIN:  Are you having pain? Yes: NPRS scale: no Pain location: R knee Pain description: sore Aggravating factors: previous fall Relieving factors: knee brace  PRECAUTIONS: Fall; per Cardiac NP's recommendation: goal resting HR is <110bpm. goal heart rate is <135 bpm with exertion.  12/31/2023:  Have HR monitor (will wear for 14 days).  Monitor BP  WEIGHT BEARING RESTRICTIONS: No  FALLS: Has patient fallen in last 6 months? Yes. Number of falls 1  LIVING ENVIRONMENT: Lives with: lives with their spouse who has stage 4 stomach CA (pt reports that she is having to physically assist him some) Lives in: Other townhouse  Stairs: 2 stories, sleeps on 1st floor in recliner  Has following equipment at home: Environmental consultant - 2 wheeled, cane  PLOF: Independent; currently getting assistance with meals   PATIENT GOALS: improve fatigue   OBJECTIVE:    TODAY'S TREATMENT: 02/18/2024 Activity Comments  Vitals at start of session 136/82, HR 77 bpm  Gait into session with her walking pole Good sequence  NuStep, Level 5> 6, x 6 minutes HR 94%, 67 bpm  Gait training with single walking pole 50 ft x 4 reps Good  sequence  Gait with figure-8 pattern around obstacles Over obstacles Cues  for cane sequence O2 94%, 102 bpm>98 bpm>78 bpm  Gait with four square step pattern Slowed pace, use of cane  Stepping over hurdles with cane Difficulty initially, then improves with better cane placement       HOME EXERCISE PROGRAM Last updated: 02/14/24 Access Code: [email or web address] URL: [email or web address] Date: 02/14/2024 Prepared by: [facility] - Outpatient  Rehab - [facility]  Program Notes you can get a pair of adjustable ankle weights ranging from 1-5 lbs from [email or web address]   Exercises - Sit to Stand with Arms Crossed  - 1 x daily - 5 x weekly - 2 sets - 10 reps - Standing Hamstring Curl with Chair Support  - 1 x daily - 7 x weekly - 3 sets - 10-15 reps - Standing March with Counter Support  - 1 x daily - 7 x weekly - 2-3 sets - 1 min hold - Side Stepping with Counter Support  - 1 x daily - 5 x weekly - 2 sets - 1 min hold - Forward Step Up  - 1 x daily - 5 x weekly - 2-3 sets - 30 sec hold   PATIENT EDUCATION: Education details:Continue current HEP, answered questions about cane/walking pole use Person educated: Patient Education method: Explanation, Demonstration, Tactile cues, Verbal cues, and Handouts Education comprehension: verbalized understanding and returned demonstration    ______________________________________________ Note: Objective measures were completed at Evaluation unless otherwise noted.  Vitals: 96% spO2, 97 bpm (102 bpm taken manually),  115/85 mmHg  DIAGNOSTIC FINDINGS: 11/05/23 echo TEE: Left Atrium: Left atrial size was severely dilated. A left atrial/left  atrial appendage thrombus was detected.   COGNITION: Overall cognitive status: Within functional limits for tasks assessed   SENSATION: WFL  POSTURE: rounded shoulders and forward head  LOWER EXTREMITY MMT:   MMT (in sitting) Right Eval Left Eval  Hip flexion 4+ 4+  Hip extension    Hip  abduction 4+ 4+  Hip adduction 4+ 4+  Hip internal rotation    Hip external rotation    Knee flexion 4+ 4+  Knee extension 4+ 4  Ankle dorsiflexion 4+ 4+  Ankle plantarflexion 4 4  Ankle inversion    Ankle eversion    (Blank rows = not tested)  GAIT: Gait pattern: slow gait speed, pushing walker too far forward Assistive device utilized: Walker - 2 wheeled Level of assistance: SBA  FUNCTIONAL TESTS:  5xSTS: 26.23 sec pushing off knees and audible R knee crepitus  Pre: 97% spO2, 89bpm  Post: 95% spO2, 102bpm   18M walk test: 17.53 sec with RW (1.87 ft/sec)                                                                                                                 __________________________________________________________________________________ GOALS: Goals reviewed with patient? Yes  SHORT TERM GOALS: Target date: 12/05/2023  Patient to be independent with initial HEP. Baseline: HEP initiated Goal status: MET  LONG TERM GOALS: Target date: 03/12/2024    Patient to be independent with advanced HEP. Baseline:  Not yet initiated  Goal status: IN PROGRESS, 12/31/2023  Patient to demonstrate gait speed of at least 2.0 ft/sec in order to improve access to community.  Baseline: 1.87 ft/sec 12/31/2023; 12.09 sec with RW (2.7 ft/sec) 01/30/24 Goal status: MET  01/30/24  Patient to report tolerance for grocery trip without fatigue limiting.    Baseline: unable; She has been able to tolerate short trips lasting ~10 minutes with her walker but recently went to [employer] at tried a long trip and it felt like it was too much. 01/30/24 Goal status: IN PROGRESS, 01/30/24  6 min walk test to improve to >600 ft with device as needed Baseline: 437 ft>710 ft in 6 MWT 12/17/23 Goal status: MET 12/17/2023  Patient to demonstrate 5xSTS test in <15 sec in order to decrease risk of falls.  Baseline: 26.23 sec pushing off knees and audible R knee crepitus; 45.41 seconds with arms crossed at  chest 5/13; 15.9 sec without UEs 01/30/24 Goal status: IN PROGRESS,  01/30/24  6 min walk test to improve to at least 1,000 ft with device as needed Baseline: 838 feet with RW 01/30/24 Goal status: IN PROGRESS      ASSESSMENT:  CLINICAL IMPRESSION: Pt presents today with her single walking stick.  Skilled PT session focused on aerobic activity as well as gait activities with cane/walking pole.  Able to work on dynamic gait and balance activities with her walking pole with good balance; with fatigue, she tends to have increased unsteadiness.  She has most difficulty with stepping over hurdles for obstacles, but she does improve with repetition.   Pt will continue to benefit from skilled PT towards goals for improved functional mobility and decreased fall risk.   OBJECTIVE IMPAIRMENTS: Abnormal gait, cardiopulmonary status limiting activity, decreased activity tolerance, decreased balance, difficulty walking, decreased strength, and pain.   PLAN:  PT FREQUENCY: 2x/week for 3 weeks followed by 1x/week for 3 weeks  PT DURATION: -  PLANNED INTERVENTIONS: 97164- PT Re-evaluation, 97110-Therapeutic exercises, 97530- Therapeutic activity, [id number]- Neuromuscular re-education, 97535- Self Care, [id number]- Manual therapy, [id number]- Gait training, [phone]- Canalith repositioning, [phone]- Aquatic Therapy, Patient/Family education, Balance training, Stair training, Taping, Dry Needling, Vestibular training, DME instructions, Cryotherapy, and Moist heat  PLAN FOR NEXT SESSION: Gait with obstacle negotiation.  Continue to progress balance/gait/endurance while monitoring vitals, Exercises to decrease reliance on UE support with gait.     Greig Anon, PT 02/18/24 5:44 PM Phone: [phone] Fax: [phone]  [facility] at [facility] [address] [place], [address] [place], [place] [address] Phone # [phone] Fax # [phone]

## 2024-02-20 ENCOUNTER — Ambulatory Visit: Admitting: Physical Therapy

## 2024-02-20 ENCOUNTER — Encounter: Payer: Self-pay | Admitting: Physical Therapy

## 2024-02-20 DIAGNOSIS — R2681 Unsteadiness on feet: Secondary | ICD-10-CM | POA: Diagnosis not present

## 2024-02-20 DIAGNOSIS — M6281 Muscle weakness (generalized): Secondary | ICD-10-CM

## 2024-02-20 DIAGNOSIS — R262 Difficulty in walking, not elsewhere classified: Secondary | ICD-10-CM | POA: Diagnosis not present

## 2024-02-20 DIAGNOSIS — R2689 Other abnormalities of gait and mobility: Secondary | ICD-10-CM

## 2024-02-20 DIAGNOSIS — R29898 Other symptoms and signs involving the musculoskeletal system: Secondary | ICD-10-CM | POA: Diagnosis not present

## 2024-02-20 NOTE — Therapy (Signed)
 OUTPATIENT PHYSICAL THERAPY NEURO TREATMENT   Patient Name: Michelle Wilcox MRN: [id number] DOB:23-Oct-1945, 78 y.o., female Today's Date: 02/24/2024  PCP: Aisha Harvey, MD REFERRING PROVIDER: Christobal Guadalajara, MD      END OF SESSION:  PT End of Session - 02/24/24 1611     Visit Number 25    Number of Visits 27    Date for PT Re-Evaluation 03/12/24    Authorization Type BCBS Medicare    Progress Note Due on Visit 28    PT Start Time 1533    PT Stop Time 1611    PT Time Calculation (min) 38 min    Equipment Utilized During Treatment Gait belt    Activity Tolerance Patient tolerated treatment well    Behavior During Therapy WFL for tasks assessed/performed                    Past Medical History:  Diagnosis Date   Anxiety    hx of   Asthma    at times   CHF (congestive heart failure) (HCC)    Depression    hx of   Dyspnea    Giant cell arteritis (HCC)    Gouty arthropathy    Hemolytic anemia (HCC)    Hyperlipidemia    Hypertension    Irregular heart beat    extra beat   Migraines    Polymyalgia (HCC)    Polymyalgia rheumatica (HCC)    Temporal arteritis (HCC)    Past Surgical History:  Procedure Laterality Date   BARTHOLIN GLAND CYST EXCISION     non ca   BREAST BIOPSY Right    CARDIOVERSION N/A 11/05/2023   Procedure: CARDIOVERSION;  Surgeon: Sheena Pugh, DO;  [facility]: [facility];  Service: Cardiovascular;  Laterality: N/A;   CARDIOVERSION N/A 12/19/2023   Procedure: CARDIOVERSION;  Surgeon: Santo Stanly LABOR, MD;  [facility]: [facility];  Service: Cardiovascular;  Laterality: N/A;   EYE MUSCLE SURGERY     as a child 51 yrs old   KNEE SURGERY  2004   rt knee   LAPAROSCOPIC SPLENECTOMY N/A 06/18/2018   Procedure: LAPAROSCOPIC SPLENECTOMY ERAS PATHWAY;  Surgeon: Mikell Katz, MD;  Location: [facility];  Service: General;  Laterality: N/A;   toncil     TONSILLECTOMY     as a child   TRANSESOPHAGEAL ECHOCARDIOGRAM  (CATH LAB) N/A 11/05/2023   Procedure: TRANSESOPHAGEAL ECHOCARDIOGRAM;  Surgeon: Sheena Pugh, DO;  [facility]: [facility];  Service: Cardiovascular;  Laterality: N/A;   TRANSESOPHAGEAL ECHOCARDIOGRAM (CATH LAB) N/A 12/19/2023   Procedure: TRANSESOPHAGEAL ECHOCARDIOGRAM;  Surgeon: Santo Stanly LABOR, MD;  Location: [facility];  Service: Cardiovascular;  Laterality: N/A;   Patient Active Problem List   Diagnosis Date Noted   Acute on chronic congestive heart failure (HCC) 12/24/2023   Acute on chronic HFrEF (heart failure with reduced ejection fraction) (HCC) 12/21/2023   Atrial fibrillation with rapid ventricular response (HCC) 12/21/2023   Atrial fibrillation, rapid (HCC) 12/21/2023   Pericardial effusion 11/07/2023   Atrial fibrillation with RVR (HCC) 11/05/2023   Borderline abnormal TFTs 11/05/2023   Macrocytosis 11/05/2023   Atrial fibrillation (HCC) 11/04/2023   PMR (polymyalgia rheumatica) (HCC) 11/04/2023   Primary localized osteoarthrosis of multiple sites 06/15/2022   Diastolic dysfunction without heart failure 03/05/2018   DOE (dyspnea on exertion) 03/05/2018   Preop cardiovascular exam 03/05/2018   Mild reactive airways disease 08/04/2017   Volume overload 08/03/2017   Hypokalemia 08/03/2017  Essential hypertension 08/03/2017   Hyperlipidemia 08/03/2017   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   Hypersensitivity reaction 11/20/2016   Drusen (degenerative) of retina, bilateral 05/22/2016   Nuclear cataract 05/22/2016   Retinal hemorrhage of left eye 05/22/2016   Temporal arteritis (HCC) 05/22/2016   Hemolytic anemia (HCC) 03/19/2016   Breast mass, right 07/01/2012    ONSET DATE: 11/04/23  REFERRING DIAG: I48.91 (ICD-10-CM) - Atrial fibrillation with RVR (HCC)  THERAPY DIAG:  Other abnormalities of gait and mobility  Unsteadiness on feet  Muscle weakness (generalized)  Other symptoms and signs involving the musculoskeletal  system  Rationale for Evaluation and Treatment: Rehabilitation  SUBJECTIVE:                                                                                                                                                                                             SUBJECTIVE STATEMENT: Reports that BP and HR has been stable. Reports plans to speak with her manager about returning back to work at 32 hours rather than 40.   Pt accompanied by: self PERTINENT HISTORY: 12/31/2023:  Pt underwent attempt at cardioversion for a-fib on 12/19/23, which did not last >24 hrs; was hospitalized after ED visit for SOB, CHF (*New orders received for PT resume and treat).  Anxiety, asthma, CHF, depression, gouty arthropathy, anemia, HLD, HTN, migraines, polymyalgia rheumatica, temporal arteritis, R knee surgery   PAIN:  Are you having pain? Yes: NPRS scale: very slight in my knee Pain location: R knee Pain description: sore Aggravating factors: previous fall Relieving factors: knee brace  PRECAUTIONS: Fall; per Cardiac NP's recommendation: goal resting HR is <110bpm. goal heart rate is <135 bpm with exertion.  12/31/2023:  Have HR monitor (will wear for 14 days).  Monitor BP  WEIGHT BEARING RESTRICTIONS: No  FALLS: Has patient fallen in last 6 months? Yes. Number of falls 1  LIVING ENVIRONMENT: Lives with: lives with their spouse who has stage 4 stomach CA (pt reports that she is having to physically assist him some) Lives in: Other townhouse  Stairs: 2 stories, sleeps on 1st floor in recliner  Has following equipment at home: Environmental consultant - 2 wheeled, cane  PLOF: Independent; currently getting assistance with meals   PATIENT GOALS: improve fatigue   OBJECTIVE:     TODAY'S TREATMENT: 02/24/24 Activity Comments  Nustep L6 x 7 min UEs/LEs  Good tolerance   superset high knee march  backwards walking  CGA d/t pt's hesitance but good stability   superset step up + opposite SKTC 2x10  4 square step  over yellow therapy poles Requires B UE support for step ups  and pt's walking stick for 4 square step, then weaned to only posterior steps   superset toe tap to 2 cones bending to pick up beanbags from floor No UE support required. Good stability           PATIENT EDUCATION: Education details: discussed POC, potential return to work, upcoming cardiac procedures and potential for 30 day hold  Person educated: Patient Education method: Explanation Education comprehension: verbalized understanding    HOME EXERCISE PROGRAM Last updated: 02/14/24 Access Code: [email or web address] URL: [email or web address] Date: 02/14/2024 Prepared by: [facility] - Outpatient  Rehab - [facility]  Program Notes you can get a pair of adjustable ankle weights ranging from 1-5 lbs from [email or web address]   Exercises - Sit to Stand with Arms Crossed  - 1 x daily - 5 x weekly - 2 sets - 10 reps - Standing Hamstring Curl with Chair Support  - 1 x daily - 7 x weekly - 3 sets - 10-15 reps - Standing March with Counter Support  - 1 x daily - 7 x weekly - 2-3 sets - 1 min hold - Side Stepping with Counter Support  - 1 x daily - 5 x weekly - 2 sets - 1 min hold - Forward Step Up  - 1 x daily - 5 x weekly - 2-3 sets - 30 sec hold      ______________________________________________ Note: Objective measures were completed at Evaluation unless otherwise noted.  Vitals: 96% spO2, 97 bpm (102 bpm taken manually),  115/85 mmHg  DIAGNOSTIC FINDINGS: 11/05/23 echo TEE: Left Atrium: Left atrial size was severely dilated. A left atrial/left  atrial appendage thrombus was detected.   COGNITION: Overall cognitive status: Within functional limits for tasks assessed   SENSATION: WFL  POSTURE: rounded shoulders and forward head  LOWER EXTREMITY MMT:   MMT (in sitting) Right Eval Left Eval  Hip flexion 4+ 4+  Hip extension    Hip abduction 4+ 4+  Hip adduction 4+ 4+  Hip internal rotation    Hip external  rotation    Knee flexion 4+ 4+  Knee extension 4+ 4  Ankle dorsiflexion 4+ 4+  Ankle plantarflexion 4 4  Ankle inversion    Ankle eversion    (Blank rows = not tested)  GAIT: Gait pattern: slow gait speed, pushing walker too far forward Assistive device utilized: Walker - 2 wheeled Level of assistance: SBA  FUNCTIONAL TESTS:  5xSTS: 26.23 sec pushing off knees and audible R knee crepitus  Pre: 97% spO2, 89bpm  Post: 95% spO2, 102bpm   45M walk test: 17.53 sec with RW (1.87 ft/sec)                                                                                                                 __________________________________________________________________________________ GOALS: Goals reviewed with patient? Yes  SHORT TERM GOALS: Target date: 12/05/2023  Patient to be independent with initial HEP. Baseline: HEP initiated Goal status: MET  LONG TERM GOALS: Target date: 03/12/2024    Patient to be  independent with advanced HEP. Baseline: Not yet initiated  Goal status: IN PROGRESS, 12/31/2023  Patient to demonstrate gait speed of at least 2.0 ft/sec in order to improve access to community.  Baseline: 1.87 ft/sec 12/31/2023; 12.09 sec with RW (2.7 ft/sec) 01/30/24 Goal status: MET  01/30/24  Patient to report tolerance for grocery trip without fatigue limiting.    Baseline: unable; She has been able to tolerate short trips lasting ~10 minutes with her walker but recently went to [employer] at tried a long trip and it felt like it was too much. 01/30/24 Goal status: IN PROGRESS, 01/30/24  6 min walk test to improve to >600 ft with device as needed Baseline: 437 ft>710 ft in 6 MWT 12/17/23 Goal status: MET 12/17/2023  Patient to demonstrate 5xSTS test in <15 sec in order to decrease risk of falls.  Baseline: 26.23 sec pushing off knees and audible R knee crepitus; 45.41 seconds with arms crossed at chest 5/13; 15.9 sec without UEs 01/30/24 Goal status: IN PROGRESS,  01/30/24  6  min walk test to improve to at least 1,000 ft with device as needed Baseline: 838 feet with RW 01/30/24 Goal status: IN PROGRESS      ASSESSMENT:  CLINICAL IMPRESSION:  Patient arrived to session without complaints. Session focused on improving balance confidence with less UE support. Patient hesitant with backwards walking and bending to the ground but demonstrated good stability with these activities. Balance confidence continues to improve slowly as patient is exposed to more challenges in PT. Pt's stamina is also improving as rest breaks are less required. No complaints at end of session.    OBJECTIVE IMPAIRMENTS: Abnormal gait, cardiopulmonary status limiting activity, decreased activity tolerance, decreased balance, difficulty walking, decreased strength, and pain.   PLAN:  PT FREQUENCY: 2x/week for 3 weeks followed by 1x/week for 3 weeks  PT DURATION: -  PLANNED INTERVENTIONS: 97164- PT Re-evaluation, 97110-Therapeutic exercises, 97530- Therapeutic activity, [id number]- Neuromuscular re-education, 97535- Self Care, 02859- Manual therapy, [id number]- Gait training, [phone]- Canalith repositioning, [phone]- Aquatic Therapy, Patient/Family education, Balance training, Stair training, Taping, Dry Needling, Vestibular training, DME instructions, Cryotherapy, and Moist heat  PLAN FOR NEXT SESSION: Consider balance-specific activities in regards to potential for work tasks without cane.  Gait with obstacle negotiation.  Continue to progress balance/gait/endurance while monitoring vitals, Exercises to decrease reliance on UE support with gait, gait with carry task   Louana Terrilyn Christians, PT, DPT 02/24/24 4:15 PM  [facility] at [facility] [address] [place], [place] [address] Phone # [phone] Fax # [phone]

## 2024-02-20 NOTE — Therapy (Signed)
 OUTPATIENT PHYSICAL THERAPY NEURO TREATMENT   Patient Name: Michelle Wilcox MRN: [id number] DOB:[birth year], 78 y.o., female Today's Date: 02/20/2024  PCP: Aisha Harvey, MD REFERRING PROVIDER: Christobal Guadalajara, MD      END OF SESSION:  PT End of Session - 02/20/24 1151     Visit Number 24    Number of Visits 27    Date for PT Re-Evaluation 03/12/24    Authorization Type [employer] Medicare    Progress Note Due on Visit 28    PT Start Time 1151    PT Stop Time 1230    PT Time Calculation (min) 39 min    Equipment Utilized During Treatment Gait belt    Activity Tolerance Patient tolerated treatment well    Behavior During Therapy WFL for tasks assessed/performed                   Past Medical History:  Diagnosis Date   Anxiety    hx of   Asthma    at times   CHF (congestive heart failure) (HCC)    Depression    hx of   Dyspnea    Giant cell arteritis (HCC)    Gouty arthropathy    Hemolytic anemia (HCC)    Hyperlipidemia    Hypertension    Irregular heart beat    extra beat   Migraines    Polymyalgia (HCC)    Polymyalgia rheumatica (HCC)    Temporal arteritis (HCC)    Past Surgical History:  Procedure Laterality Date   BARTHOLIN GLAND CYST EXCISION     non ca   BREAST BIOPSY Right    CARDIOVERSION N/A 11/05/2023   Procedure: CARDIOVERSION;  Surgeon: Sheena Pugh, DO;  Location: [facility];  Service: Cardiovascular;  Laterality: N/A;   CARDIOVERSION N/A 12/19/2023   Procedure: CARDIOVERSION;  Surgeon: Santo Stanly LABOR, MD;  Location: [facility];  Service: Cardiovascular;  Laterality: N/A;   EYE MUSCLE SURGERY     as a child 52 yrs old   KNEE SURGERY  2004   rt knee   LAPAROSCOPIC SPLENECTOMY N/A 06/18/2018   Procedure: LAPAROSCOPIC SPLENECTOMY ERAS PATHWAY;  Surgeon: Mikell Katz, MD;  Location: [facility];  Service: General;  Laterality: N/A;   toncil     TONSILLECTOMY     as a child   TRANSESOPHAGEAL ECHOCARDIOGRAM (CATH  LAB) N/A 11/05/2023   Procedure: TRANSESOPHAGEAL ECHOCARDIOGRAM;  Surgeon: Sheena Pugh, DO;  Location: [facility];  Service: Cardiovascular;  Laterality: N/A;   TRANSESOPHAGEAL ECHOCARDIOGRAM (CATH LAB) N/A 12/19/2023   Procedure: TRANSESOPHAGEAL ECHOCARDIOGRAM;  Surgeon: Santo Stanly LABOR, MD;  Location: [facility];  Service: Cardiovascular;  Laterality: N/A;   Patient Active Problem List   Diagnosis Date Noted   Acute on chronic congestive heart failure (HCC) 12/24/2023   Acute on chronic HFrEF (heart failure with reduced ejection fraction) (HCC) 12/21/2023   Atrial fibrillation with rapid ventricular response (HCC) 12/21/2023   Atrial fibrillation, rapid (HCC) 12/21/2023   Pericardial effusion 11/07/2023   Atrial fibrillation with RVR (HCC) 11/05/2023   Borderline abnormal TFTs 11/05/2023   Macrocytosis 11/05/2023   Atrial fibrillation (HCC) 11/04/2023   PMR (polymyalgia rheumatica) (HCC) 11/04/2023   Primary localized osteoarthrosis of multiple sites 06/15/2022   Diastolic dysfunction without heart failure 03/05/2018   DOE (dyspnea on exertion) 03/05/2018   Preop cardiovascular exam 03/05/2018   Mild reactive airways disease 08/04/2017   Volume overload 08/03/2017   Hypokalemia 08/03/2017   Essential  hypertension 08/03/2017   Hyperlipidemia 08/03/2017   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   Hypersensitivity reaction 11/20/2016   Drusen (degenerative) of retina, bilateral 05/22/2016   Nuclear cataract 05/22/2016   Retinal hemorrhage of left eye 05/22/2016   Temporal arteritis (HCC) 05/22/2016   Hemolytic anemia (HCC) 03/19/2016   Breast mass, right 07/01/2012    ONSET DATE: 11/04/23  REFERRING DIAG: I48.91 (ICD-10-CM) - Atrial fibrillation with RVR (HCC)  THERAPY DIAG:  Other abnormalities of gait and mobility  Unsteadiness on feet  Muscle weakness (generalized)  Rationale for Evaluation and Treatment: Rehabilitation  SUBJECTIVE:                                                                                                                                                                                              SUBJECTIVE STATEMENT: No changes since last visit. Pt accompanied by: self PERTINENT HISTORY: 12/31/2023:  Pt underwent attempt at cardioversion for a-fib on 12/19/23, which did not last >24 hrs; was hospitalized after ED visit for SOB, CHF (*New orders received for PT resume and treat).  Anxiety, asthma, CHF, depression, gouty arthropathy, anemia, HLD, HTN, migraines, polymyalgia rheumatica, temporal arteritis, R knee surgery   PAIN:  Are you having pain? Yes: NPRS scale: no Pain location: R knee Pain description: sore Aggravating factors: previous fall Relieving factors: knee brace  PRECAUTIONS: Fall; per Cardiac NP's recommendation: goal resting HR is <110bpm. goal heart rate is <135 bpm with exertion.  12/31/2023:  Have HR monitor (will wear for 14 days).  Monitor BP  WEIGHT BEARING RESTRICTIONS: No  FALLS: Has patient fallen in last 6 months? Yes. Number of falls 1  LIVING ENVIRONMENT: Lives with: lives with their spouse who has stage 4 stomach CA (pt reports that she is having to physically assist him some) Lives in: Other townhouse  Stairs: 2 stories, sleeps on 1st floor in recliner  Has following equipment at home: Environmental consultant - 2 wheeled, cane  PLOF: Independent; currently getting assistance with meals   PATIENT GOALS: improve fatigue   OBJECTIVE:      TODAY'S TREATMENT: 02/20/2024 Activity Comments  Vitals at start of session 116/64, HR  71 bpm  Gait into session with her walking pole Good sequence  NuStep, Level 5> 6, x 6 minutes HR 94%, 67 bpm  Gait training with single walking pole 85 ft x 2 reps Good sequence  Gait with figure-8 pattern and around obstacles Over obstacles Cues for cane sequence O2 96%, 74 bpm O2 sats 93%, HR 88 bpm     Stepping over hurdles with cane Difficulty  initially, then improves with better cane  placement  Gait with no device for environmental scanning to find objects, 1:30, 2 minutes Min guard>supervision, no LOB 93%, HR 95 bpm       HOME EXERCISE PROGRAM Last updated: 02/14/24 Access Code: [email or web address] URL: [email or web address] Date: 02/14/2024 Prepared by: [facility] - Outpatient  Rehab - [facility]  Program Notes you can get a pair of adjustable ankle weights ranging from 1-5 lbs from [email or web address]   Exercises - Sit to Stand with Arms Crossed  - 1 x daily - 5 x weekly - 2 sets - 10 reps - Standing Hamstring Curl with Chair Support  - 1 x daily - 7 x weekly - 3 sets - 10-15 reps - Standing March with Counter Support  - 1 x daily - 7 x weekly - 2-3 sets - 1 min hold - Side Stepping with Counter Support  - 1 x daily - 5 x weekly - 2 sets - 1 min hold - Forward Step Up  - 1 x daily - 5 x weekly - 2-3 sets - 30 sec hold   PATIENT EDUCATION: Education details:Began problem solving some work-related balance tasks Person educated: Patient Education method: Explanation and Verbal cues Education comprehension: verbalized understanding and returned demonstration    ______________________________________________ Note: Objective measures were completed at Evaluation unless otherwise noted.  Vitals: 96% spO2, 97 bpm (102 bpm taken manually),  115/85 mmHg  DIAGNOSTIC FINDINGS: 11/05/23 echo TEE: Left At[facility]: Left atrial size was severely dilated. A left atrial/left  atrial appendage thrombus was detected.   COGNITION: Overall cognitive status: Within functional limits for tasks assessed   SENSATION: WFL  POSTURE: rounded shoulders and forward head  LOWER EXTREMITY MMT:   MMT (in sitting) Right Eval Left Eval  Hip flexion 4+ 4+  Hip extension    Hip abduction 4+ 4+  Hip adduction 4+ 4+  Hip internal rotation    Hip external rotation    Knee flexion 4+ 4+  Knee extension 4+ 4  Ankle dorsiflexion 4+ 4+   Ankle plantarflexion 4 4  Ankle inversion    Ankle eversion    (Blank rows = not tested)  GAIT: Gait pattern: slow gait speed, pushing walker too far forward Assistive device utilized: Walker - 2 wheeled Level of assistance: SBA  FUNCTIONAL TESTS:  5xSTS: 26.23 sec pushing off knees and audible R knee crepitus  Pre: 97% spO2, 89bpm  Post: 95% spO2, 102bpm   26M walk test: 17.53 sec with RW (1.87 ft/sec)                                                                                                                 __________________________________________________________________________________ GOALS: Goals reviewed with patient? Yes  SHORT TERM GOALS: Target date: 12/05/2023  Patient to be independent with initial HEP. Baseline: HEP initiated Goal status: MET  LONG TERM GOALS: Target date: 03/12/2024    Patient to be independent with advanced HEP. Baseline: Not yet initiated  Goal status: IN PROGRESS, 12/31/2023  Patient to demonstrate gait speed of at least  2.0 ft/sec in order to improve access to community.  Baseline: 1.87 ft/sec 12/31/2023; 12.09 sec with RW (2.7 ft/sec) 01/30/24 Goal status: MET  01/30/24  Patient to report tolerance for grocery trip without fatigue limiting.    Baseline: unable; She has been able to tolerate short trips lasting ~10 minutes with her walker but recently went to [employer] at tried a long trip and it felt like it was too much. 01/30/24 Goal status: IN PROGRESS, 01/30/24  6 min walk test to improve to >600 ft with device as needed Baseline: 437 ft>710 ft in 6 MWT 12/17/23 Goal status: MET 12/17/2023  Patient to demonstrate 5xSTS test in <15 sec in order to decrease risk of falls.  Baseline: 26.23 sec pushing off knees and audible R knee crepitus; 45.41 seconds with arms crossed at chest 5/13; 15.9 sec without UEs 01/30/24 Goal status: IN PROGRESS,  01/30/24  6 min walk test to improve to at least 1,000 ft with device as needed Baseline:  838 feet with RW 01/30/24 Goal status: IN PROGRESS      ASSESSMENT:  CLINICAL IMPRESSION:  Pt presents today again with her single walking stick.  Skilled PT session focused on gait activities with cane/walking pole and no device.  Able to work on dynamic gait and balance activities with her walking pole with good balance.  She continues to have some difficulty with stepping over hurdles for obstacles, but she does improve with repetition.   Pt was able to perform gait tasks with no device, simulating some work-related dynamic balance/gait; she is able to perform these activities 1-2 minutes without LOB.  She feels good about these tasks and has no complaints upon leaving session today.  Pt will continue to benefit from skilled PT towards goals for improved independence with functional mobility and decreased fall risk.   OBJECTIVE IMPAIRMENTS: Abnormal gait, cardiopulmonary status limiting activity, decreased activity tolerance, decreased balance, difficulty walking, decreased strength, and pain.   PLAN:  PT FREQUENCY: 2x/week for 3 weeks followed by 1x/week for 3 weeks  PT DURATION: -  PLANNED INTERVENTIONS: [id number]- PT Re-evaluation, 97110-Therapeutic exercises, 97530- Therapeutic activity, [id number]- Neuromuscular re-education, 97535- Self Care, [id number]- Manual therapy, [id number]- Gait training, [phone]- Canalith repositioning, [phone]- Aquatic Therapy, Patient/Family education, Balance training, Stair training, Taping, Dry Needling, Vestibular training, DME instructions, Cryotherapy, and Moist heat  PLAN FOR NEXT SESSION: Consider balance-specific activities in regards to potential for work tasks without cane.  Gait with obstacle negotiation.  Continue to progress balance/gait/endurance while monitoring vitals, Exercises to decrease reliance on UE support with gait, gait with carry task (*at the end of 7/3 week, pt wanted to make sure she had 2 appts for next week; However, please check to see which week  she should go down to 1x/wk*)   Greig Anon, PT 02/20/24 12:35 PM Phone: [phone] Fax: [phone]  [facility] at [facility] [address], [address] [place], [place] [address] Phone # [phone] Fax # [phone]

## 2024-02-24 ENCOUNTER — Encounter: Payer: Self-pay | Admitting: Physical Therapy

## 2024-02-24 ENCOUNTER — Ambulatory Visit: Admitting: Physical Therapy

## 2024-02-24 DIAGNOSIS — R2689 Other abnormalities of gait and mobility: Secondary | ICD-10-CM | POA: Diagnosis not present

## 2024-02-24 DIAGNOSIS — R29898 Other symptoms and signs involving the musculoskeletal system: Secondary | ICD-10-CM

## 2024-02-24 DIAGNOSIS — R262 Difficulty in walking, not elsewhere classified: Secondary | ICD-10-CM | POA: Diagnosis not present

## 2024-02-24 DIAGNOSIS — R2681 Unsteadiness on feet: Secondary | ICD-10-CM | POA: Diagnosis not present

## 2024-02-24 DIAGNOSIS — M6281 Muscle weakness (generalized): Secondary | ICD-10-CM

## 2024-02-24 NOTE — Progress Notes (Unsigned)
 Cardiology Office Note    Date:  02/25/2024  ID:  Michelle Wilcox, Michelle Wilcox 01-07-1946, MRN [id number] PCP:  Aisha Harvey, MD  Cardiologist:  Alm Clay, MD  Electrophysiologist:  Fonda Kitty, MD   Chief Complaint: Follow up for HFrEF, atrial fibrillation, valvular disease   History of Present Illness: .    Michelle Wilcox is a 78 y.o. female with visit-pertinent history of polymyalgia rheumatica, temporal arteritis, diastolic dysfunction and atrial fibrillation.  Patient presented to ED on 11/04/2023 with new onset atrial fibrillation characterized by rapid ventricular response, heart rate in the 150s.  Patient described a fluttering sensation in her chest.  Atrial fibrillation was identified following a cortisone injection in her knee.  In hospital patient reported shortness of breath, dizziness and palpitations for more than a few days, though exact duration was unclear.  She denied any recent chest pain.  Patient was planned for TEE/DCCV.  However TEE showed decreased EF of 40 to 45% and left atrial appendage thrombus so DCCV was unable to be performed.  Given decrease in EF diltiazem  was discontinued, was unable to add p.o. Lopressor  as blood pressures were soft.  Patient was loaded on digoxin .  An attempt for better rate control diltiazem  was started however this resulted in an episode of symptomatic hypotension and a rapid response with fluid bolus.  Patient was started on IV amiodarone  with goal of rate control only.  Patient was discharged on 11/07/2023 on metoprolol  succinate 50 mg twice daily, digoxin  0.125 mg daily and Eliquis .  Patient was started on amiodarone  400 mg twice daily for 5 days followed by 200 mg twice daily for 2 weeks followed by 200 mg daily thereafter.  Patient was seen in clinic on 11/19/2023.  She reported that she was doing well overall, reported that her heart rate had been well-controlled at home.  She reported some intermittent low blood pressures,  specifically when she was at PT earlier that week.  Noted that she had not been hydrating very well, noted improvement with increased hydration.  Patient was last seen in clinic on 12/12/2023, noted that she was having significant anxiety.  She reported that her heart rate at home had been very well-controlled and her blood pressures had improved.  She denied any chest pain, shortness of breath, lower extremity edema.  Patient was scheduled for TEE guided DCCV on May 1st with Dr. Santo.  TEE on 12/19/2023 indicated LVEF of 60 to 65%, RV systolic function was normal, RV size was mildly enlarged, left atrial size was severely dilated, no left atrial/left atrial appendage thrombus was detected, right atrial size was severely dilated, there was a central jet of atrial, functional MR with blunted pulmonary vein signal, mitral valve was normal in structure with moderate to severe mitral valve regurgitation, no evidence of mitral stenosis.  Atrial functional tricuspid regurgitation with 2 jets, tricuspid valve regurgitation was moderate.  There was a large central jet and a smaller, atrial jet between the non and left cusp, aortic valve regurgitation was moderate, no aortic valve stenosis was present.  Patient underwent DCCV with conversion from atrial fibrillation to normal sinus rhythm.  On 12/21/2023 patient return to the ER with palpitation and dyspnea, original ECG with atrial fibrillation.  She was started on IV amiodarone  with conversion to sinus rhythm with PACs.  Initial troponin 544, she was chest pain-free.  Echocardiogram during admission indicated LVEF of 60 to 65%, no RWMA, normal RV systolic function, moderately dilated LA, mild to moderate  AI.  BNP was elevated to 193.3, chest x-ray showed pulmonary vascular congestion, progressive edema, probable small pleural effusions.  Patient was diuresed with IV Lasix .  Patient was started on spironolactone  12.5 mg daily.  It was noted that her elevated  troponin was likely due to demand ischemia however recommended outpatient PET CT. The electrophysiology team was consulted while patient was in the hospital.  It was felt that patient was now paroxysmal while on amiodarone  as there were episodes of patient being in normal sinus rhythm with PACs on her low telemetry.  It was recommended that she wear a 2-week ZIO monitor on discharge to assess A-fib burden, noted that if she was in A-fib a majority of the time then risk of amiodarone  long-term may exceed benefits and transition to rate control strategy may be necessary.  Patient was seen in clinic on 01/10/2024, patient reported that she was fatigued easily when exerting herself and would have to stop and take breaks.  She denied any chest pain.  She reported since starting her new medication she overall simply does not feel well and is eager to explore other options.  Discussed with Dr. Anner, could potentially consider ablation which would likely require pacemaker placement.  Office discussed elevated troponins during recent hospitalization, per Dr. Anner would proceed with right and left heart catheterization given elevated troponins but also allow for evaluation of her valves.  Patient spironolactone  was discontinued.  Patient was last in clinic on 01/29/2024 she reported she was doing well overall.  She denied any chest pain, increased lower extremity edema, orthopnea or PND.  She reported fatigue and shortness of breath with ongoing exertion however felt this had been slightly improved.  Patient wondered if she had an allergy to furosemide , notes history of sulfa allergy and had been having red and itchy earlobes and scalp for the last 2 weeks.  Patient requested decreasing Lasix  back to 20 mg daily.  Patient was seen in clinic by Dr. Kennyth on 02/13/2024.  It was noted the patient had failed amiodarone  and other AAD's are unlikely to be successful.  Cardiac ablation was discussed, patient wished to think  about her options, if ablation was not pursued she would be in permanent atrial fibrillation.  Today she presents for follow-up.  She reports that she has been doing well overall. She denies any chest pain, lower extremity edema, orthopnea or pnd.  Patient reports that her dyspnea on exertion is at her recent baseline, denies any worsening, denies any significant weight gains.  She reports that she has not noticed a significant change since stopping her amiodarone , reports that her heart rate has remained well-controlled, she is tolerating increased doses of metoprolol  well.  She denies any palpitations.  Patient continues to note ongoing fatigue, reports that this has improved in recent weeks.  Patient reports that she is ready to proceed with cardiac catheterization, reports she is still thinking about ablation in the future.  ROS: .   Today she denies chest pain, lower extremity edema, palpitations, melena, hematuria, hemoptysis, diaphoresis, weakness, presyncope, syncope, orthopnea, and PND.  All other systems are reviewed and otherwise negative. Studies Reviewed: SABRA   EKG:  EKG is ordered today, personally reviewed, demonstrating  EKG Interpretation Date/Time:  Tuesday February 25 2024 14:49:25 EDT Ventricular Rate:  74 PR Interval:    QRS Duration:  80 QT Interval:  224 QTC Calculation: 248 R Axis:   34  Text Interpretation: Atrial fibrillation When compared with ECG of 10-Jan-2024 14:35,  Criteria for Inferior infarct are no longer Present Nonspecific T wave abnormality now evident in Inferior leads Confirmed by Masae Lukacs [phone]) on 02/25/2024 4:21:28 PM   CV Studies: Cardiac studies reviewed are outlined and summarized above. Otherwise please see EMR for full report. Cardiac Studies & Procedures   ______________________________________________________________________________________________   STRESS TESTS  MYOCARDIAL PERFUSION IMAGING 03/11/2018  Interpretation Summary  The left  ventricular ejection fraction is hyperdynamic (>65%).  Nuclear stress EF: 69%.  There was no ST segment deviation noted during stress.  The study is normal.  This is a low risk study.  Low risk stress nuclear study with normal perfusion and normal left ventricular regional and global systolic function.   ECHOCARDIOGRAM  ECHOCARDIOGRAM COMPLETE 12/22/2023  Narrative ECHOCARDIOGRAM REPORT    Patient Name:   DALILA ARCA Date of Exam: 12/22/2023 Medical Rec #:  [id number]               Height:       63.0 in Accession #:    [id number]              Weight:       184.1 lb Date of Birth:  October 08, 1945               BSA:          1.867 m Patient Age:    77 years                BP:           118/56 mmHg Patient Gender: F                       HR:           71 bpm. Exam Location:  Inpatient  Procedure: 2D Echo (Both Spectral and Color Flow Doppler were utilized during procedure).  Indications:    acute diastolic chf  History:        Patient has prior history of Echocardiogram examinations, most recent 12/19/2023. Risk Factors:Hypertension and Dyslipidemia.  Sonographer:    Tinnie Barefoot RDCS Referring Phys: [id number] PETER C NISHAN  IMPRESSIONS   1. Left ventricular ejection fraction, by estimation, is 60 to 65%. The left ventricle has normal function. The left ventricle has no regional wall motion abnormalities. Left ventricular diastolic parameters are indeterminate. 2. Right ventricular systolic function is normal. The right ventricular size is normal. Tricuspid regurgitation signal is inadequate for assessing PA pressure. 3. Left atrial size was moderately dilated. 4. The mitral valve is normal in structure. Trivial mitral valve regurgitation. No evidence of mitral stenosis. 5. The aortic valve is tricuspid. Aortic valve regurgitation is mild to moderate. No aortic stenosis is present. 6. The inferior vena cava is dilated in size with >50% respiratory variability,  suggesting right atrial pressure of 8 mmHg.  FINDINGS Left Ventricle: Left ventricular ejection fraction, by estimation, is 60 to 65%. The left ventricle has normal function. The left ventricle has no regional wall motion abnormalities. The left ventricular internal cavity size was normal in size. There is no left ventricular hypertrophy. Left ventricular diastolic parameters are indeterminate.  Right Ventricle: The right ventricular size is normal. Right vetricular wall thickness was not well visualized. Right ventricular systolic function is normal. Tricuspid regurgitation signal is inadequate for assessing PA pressure.  Left Atrium: Left atrial size was moderately dilated.  Right Atrium: Right atrial size was normal in size.  Pericardium: There is no evidence of pericardial effusion.  Mitral Valve: The mitral valve is normal in structure. Trivial mitral valve regurgitation. No evidence of mitral valve stenosis.  Tricuspid Valve: The tricuspid valve is normal in structure. Tricuspid valve regurgitation is trivial. No evidence of tricuspid stenosis.  Aortic Valve: The aortic valve is tricuspid. Aortic valve regurgitation is mild to moderate. Aortic regurgitation PHT measures 385 msec. No aortic stenosis is present. Aortic valve mean gradient measures 4.4 mmHg. Aortic valve peak gradient measures 11.0 mmHg. Aortic valve area, by VTI measures 2.17 cm.  Pulmonic Valve: The pulmonic valve was not well visualized. Pulmonic valve regurgitation is not visualized. No evidence of pulmonic stenosis.  Aorta: The aortic root and ascending aorta are structurally normal, with no evidence of dilitation.  Venous: The inferior vena cava is dilated in size with greater than 50% respiratory variability, suggesting right atrial pressure of 8 mmHg.  IAS/Shunts: The interatrial septum was not well visualized.   LEFT VENTRICLE PLAX 2D LVIDd:         5.20 cm   Diastology LVIDs:         3.50 cm   LV e'  medial:    6.85 cm/s LV PW:         0.70 cm   LV E/e' medial:  11.8 LV IVS:        0.90 cm   LV e' lateral:   8.92 cm/s LVOT diam:     1.80 cm   LV E/e' lateral: 9.0 LV SV:         56 LV SV Index:   30 LVOT Area:     2.54 cm   RIGHT VENTRICLE          IVC RV Basal diam:  3.00 cm  IVC diam: 2.50 cm  LEFT ATRIUM           Index        RIGHT ATRIUM           Index LA diam:      4.60 cm 2.46 cm/m   RA Area:     16.40 cm LA Vol (A2C): 87.6 ml 46.93 ml/m  RA Volume:   40.70 ml  21.80 ml/m LA Vol (A4C): 87.3 ml 46.77 ml/m AORTIC VALVE AV Area (Vmax):    1.75 cm AV Area (Vmean):   2.00 cm AV Area (VTI):     2.17 cm AV Vmax:           165.60 cm/s AV Vmean:          93.719 cm/s AV VTI:            0.261 m AV Peak Grad:      11.0 mmHg AV Mean Grad:      4.4 mmHg LVOT Vmax:         114.00 cm/s LVOT Vmean:        73.700 cm/s LVOT VTI:          0.222 m LVOT/AV VTI ratio: 0.85 AI PHT:            385 msec  AORTA Ao Root diam: 2.90 cm Ao Asc diam:  3.30 cm  MITRAL VALVE MV Area (PHT): 3.60 cm    SHUNTS MV Decel Time: 211 msec    Systemic VTI:  0.22 m MV E velocity: 80.50 cm/s  Systemic Diam: 1.80 cm MV A velocity: 56.80 cm/s MV E/A ratio:  1.42  Dorn Ross MD Electronically signed by Dorn Ross MD Signature Date/Time: 12/22/2023/6:04:31 PM    Final  TEE  ECHO TEE 12/19/2023  Narrative TRANSESOPHOGEAL ECHO REPORT    Patient Name:   Dashanae Longfield Date of Exam: 12/19/2023 Medical Rec #:  [id number]               Height:       63.0 in Accession #:    [id number]              Weight:       184.0 lb Date of Birth:  07-19-1946               BSA:          1.866 m Patient Age:    77 years                BP:           156/89 mmHg Patient Gender: F                       HR:           102 bpm. Exam Location:  Inpatient  Procedure: Transesophageal Echo, 3D Echo, Color Doppler, Cardiac Doppler and Intracardiac Opacification Agent (Both Spectral and Color  Flow Doppler were utilized during procedure).  Indications:     I48.91* Unspecified atrial fibrillation  History:         Patient has prior history of Echocardiogram examinations, most recent 11/05/2023. Arrythmias:Atrial Fibrillation; Risk Factors:Hypertension and Dyslipidemia.  Sonographer:     Damien Senior RDCS Referring Phys:  Mell Guia D Alline Pio Diagnosing Phys: Stanly Leavens MD  PROCEDURE: After discussion of the risks and benefits of a TEE, an informed consent was obtained from the patient. The transesophogeal probe was passed without difficulty through the esophogus of the patient. Sedation performed by different physician. The patient was monitored while under deep sedation. Anesthestetic sedation was provided intravenously by Anesthesiology: 160mg  of Propofol . The patient developed no complications during the procedure. A successful direct current cardioversion was performed at 200 joules with 1 attempt.  IMPRESSIONS   1. Left ventricular ejection fraction, by estimation, is 60 to 65%. Left ventricular ejection fraction by 3D volume is 65 %. The left ventricle has normal function. 2. Right ventricular systolic function is normal. The right ventricular size is mildly enlarged. 3. Left atrial size was severely dilated. No left atrial/left atrial appendage thrombus was detected. 4. Right atrial size was severely dilated. 5. Central jet of atrial, functional MR with blunted pulmonary vein signal. The mitral valve is normal in structure. Moderate to severe mitral valve regurgitation. No evidence of mitral stenosis. 6. Atrial functional tricuspid regurgitation- two jets. Tricuspid valve regurgitation is moderate. 7. There is a larger central jet and a smaller, commissural jet between the non and left cusp; central jet 2DVC: 0.3 cm. The aortic valve is tricuspid. Aortic valve regurgitation is moderate. No aortic stenosis is present. 8. 3D performed of the LAA and demonstrates Patent  LAA- Chicken wing morphology. 23 mm maximal diameter. 9. Cannot exclude a small PFO.  FINDINGS Left Ventricle: Left ventricular ejection fraction, by estimation, is 60 to 65%. Left ventricular ejection fraction by 3D volume is 65 %. The left ventricle has normal function. Definity  contrast agent was given IV to delineate the left ventricular endocardial borders. The left ventricular internal cavity size was normal in size.  Right Ventricle: The right ventricular size is mildly enlarged. No increase in right ventricular wall thickness. Right ventricular systolic function is normal.  Left Atrium: Left atrial size was  severely dilated. No left atrial/left atrial appendage thrombus was detected.  Right Atrium: Right atrial size was severely dilated.  Pericardium: There is no evidence of pericardial effusion.  Mitral Valve: Central jet of atrial, functional MR with blunted pulmonary vein signal. The mitral valve is normal in structure. Moderate to severe mitral valve regurgitation. No evidence of mitral valve stenosis.  Tricuspid Valve: Atrial functional tricuspid regurgitation- two jets. The tricuspid valve is normal in structure. Tricuspid valve regurgitation is moderate . No evidence of tricuspid stenosis.  Aortic Valve: There is a larger central jet and a smaller, commissural jet between the non and left cusp; central jet 2DVC: 0.3 cm. The aortic valve is tricuspid. Aortic valve regurgitation is moderate. No aortic stenosis is present.  Pulmonic Valve: The pulmonic valve was normal in structure. Pulmonic valve regurgitation is trivial. No evidence of pulmonic stenosis.  Aorta: The aortic root and ascending aorta are structurally normal, with no evidence of dilitation.  IAS/Shunts: There is right bowing of the interatrial septum, suggestive of elevated left atrial pressure. Cannot exclude a small PFO.  Additional Comments: 3D was performed not requiring image post processing on an  independent workstation and was normal. Spectral Doppler performed.   3D Volume EF LV 3D EF:    Left ventricular ejection fraction by 3D volume is 65 %. LV 3D EDV:   47.36 ml LV 3D ESV:   16.50 ml  3D Volume EF: 3D EF:        65 %  Stanly Leavens MD Electronically signed by Stanly Leavens MD Signature Date/Time: 12/19/2023/12:39:33 PM    Final        ______________________________________________________________________________________________       Current Reported Medications:.    Current Meds  Medication Sig   acetaminophen  (TYLENOL ) 650 MG CR tablet Take 1,300 mg by mouth 2 (two) times daily.   ALPRAZolam  (XANAX ) 0.25 MG tablet Take 0.25 mg by mouth 2 (two) times daily as needed for anxiety.   apixaban  (ELIQUIS ) 5 MG TABS tablet Take 1 tablet (5 mg total) by mouth 2 (two) times daily.   cholecalciferol  (VITAMIN D3) 25 MCG (1000 UNIT) tablet Take 1,000 Units by mouth at bedtime. Take one capsule by mouth at bedtime every other day.   clindamycin (CLEOCIN) 150 MG capsule Take 600 mg by mouth See admin instructions. Take prior to Dental Procedures   diclofenac  Sodium (VOLTAREN ) 1 % GEL Apply 4 g topically as needed (Hands, knees, feet).   digoxin  (LANOXIN ) 0.125 MG tablet Take 1 tablet (0.125 mg total) by mouth daily.   diphenhydrAMINE  (BENADRYL ) 25 MG tablet Take 25-50 mg by mouth daily as needed for allergies (allergic reactions).   folic acid  (FOLVITE ) 1 MG tablet TAKE 1 TABLET(1 MG) BY MOUTH DAILY   furosemide  (LASIX ) 20 MG tablet Take 1 tablet (20 mg total) by mouth daily. Take an additional dose once a day as needed for lower extremity Edema.   gabapentin  (NEURONTIN ) 100 MG capsule Take 200 mg by mouth 2 (two) times daily.   lactase (LACTAID) 3000 units tablet Take 9,000-15,000 Units by mouth as needed (consuming dairy products).   loratadine  (CLARITIN ) 10 MG tablet Take 10 mg by mouth daily.   metoprolol  succinate (TOPROL -XL) 50 MG 24 hr tablet Take 1.5  tablets (75 mg total) by mouth in the morning and at bedtime. Take with or immediately following a meal.   Multiple Vitamin (MULTIVITAMIN) tablet Take 1 tablet by mouth at bedtime. One-A-Day Women's Vitamin   potassium chloride  SA (KLOR-CON   M) 20 MEQ tablet Take 1 tablet (20 mEq total) by mouth daily.   predniSONE  (DELTASONE ) 5 MG tablet Take 1 tablet (5 mg total) by mouth daily with breakfast.   Probiotic Product (ALIGN) 4 MG CAPS Take 4 mg by mouth every morning.   rosuvastatin  (CRESTOR ) 5 MG tablet Take 5 mg by mouth at bedtime. Take one tablet by mouth at bedtime every other day.   VENTOLIN  HFA 108 (90 Base) MCG/ACT inhaler Take 1-2 puffs by mouth every 4 (four) hours as needed for shortness of breath.    Physical Exam:    VS:  BP 126/81   Pulse 74   Ht 5' 3 (1.6 m)   Wt 178 lb (80.7 kg)   SpO2 99%   BMI 31.53 kg/m    Wt Readings from Last 3 Encounters:  02/25/24 178 lb (80.7 kg)  02/13/24 179 lb (81.2 kg)  01/29/24 181 lb (82.1 kg)    GEN: Well nourished, well developed in no acute distress NECK: No JVD; No carotid bruits CARDIAC: irregular R irregular R, no murmurs, rubs, gallops RESPIRATORY:  Clear to auscultation without rales, wheezing or rhonchi  ABDOMEN: Soft, non-tender, non-distended EXTREMITIES:  No edema; No acute deformity     Asessement and Plan:.    Elevated troponin/valve disease:  During admission in 12/2023 patient's troponin level increased from 28>>544.  Ischemic evaluation was not pursued while inpatient, felt to be related to demand ischemia from A-fib and hypervolemia with recommended ischemic evaluation in outpatient setting.  TEE on 5/1 noted moderate to severe mitral valve regurgitation (functional MR), moderate TR, moderate AI.  Echo on 12/22/2023 indicated EF 60 to 65%, trivial MR, mild to moderate aortic valve regurgitation.  Previously reviewed with Dr. Anner, he recommended a right and left heart catheterization given positive troponin while  inpatient, as this would also allow for evaluation of valves and pressures. Today she reports that she is doing well, continues to note ongoing fatigue however improved as well as dyspnea on exertion.  She denies any significant chest pain.  Notes her activity tolerance remains reduced, continues to work with physical therapy.  Discussed Dr. Genice recommendation for right and left cardiac catheterization, patient in agreement please see consent below.  Patient has been scheduled for right and left heart catheterization with Dr. Anner on 03/04/2024.  She will hold Eliquis  for 2 days prior to procedure. Check CBC, CMET and digoxin  level today.  Informed Consent   Shared Decision Making/Informed Consent The risks [stroke (1 in 1000), death (1 in 1000), kidney failure [usually temporary] (1 in 500), bleeding (1 in 200), allergic reaction [possibly serious] (1 in 200)], benefits (diagnostic support and management of coronary artery disease) and alternatives of a cardiac catheterization were discussed in detail with Ms. Delfavero and she is willing to proceed.     Persistent atrial fibrillation: Patient first diagnosed with A-fib in 10/2023, started on amiodarone , digoxin  and metoprolol . She underwent TEE guided DCCV on 5/1, TEE noted severe biatrial enlargement, moderate to severe MR, moderate TR. Patient presented 5/2 with shortness of breath was found to be back in A-fib with RVR with evidence of hypervolemia. She was again loaded with IV amiodarone . Noted it would likely be difficult to maintain sinus rhythm given biatrial enlargement and valvular disease. Patient now being followed by EP, cardiac monitor showed 100% burden of atrial fibrillation.  She was seen by Dr. Kennyth on 6/26 with recommendation for consideration of ablation, patient continues think of her options. Patient denies  any bleeding problems on Eliquis , reports numerous medications.  Continue metoprolol  succinate 50 mg twice daily, digoxin ,  Eliquis  5 mg twice daily. Check Digoxin  level today.   Chronic diastolic heart failure: Echocardiogram on 12/23/2023 indicated LVEF 66 5%, no RWMA, normal RV systolic function, moderately dilated LA, mild to moderate AI. Patient's BNP was elevated to 193.3, chest x-ray showed pulmonary vascular congestion, progressive edema, probable small pleural effusions, she underwent IV diuresis.  She was discharged on spironolactone  12.5 mg daily and Lasix  40 mg daily however patient on follow-up reported low blood pressures with increased fatigue and weakness.  Per Dr. Anner discontinued spironolactone . At last office visit patient requested reduction in dose of Lasix  to 20 mg daily as she felt she was having allergic reactions consisting of an itchy scalp and earlobes on higher doses, this was reduced. Today she reports that she is doing well, reports that her dyspnea on exertion is at baseline, denies any shortness of breath at rest, orthopnea or PND.  She denies any lower extremity edema.  Patient reports that she has done well on Lasix  20 mg daily.  Today she appears euvolemic and well compensated on exam.  Continue Lasix  20 mg daily.  Hyperlipidemia: Continue Crestor  5 mg every other the day. Check fasting lipid profile and CMET.    Disposition: F/u with Dr. Anner in 03/2024 as scheduled.   Signed, Anevay Campanella D Meyah Corle, NP

## 2024-02-24 NOTE — H&P (View-Only) (Signed)
 Cardiology Office Note    Date:  02/25/2024  ID:  Michelle, Wilcox Feb 22, 1946, MRN [id number] PCP:  Michelle Harvey, MD  Cardiologist:  Alm Clay, MD  Electrophysiologist:  Fonda Kitty, MD   Chief Complaint: Follow up for HFrEF, atrial fibrillation, valvular disease   History of Present Illness: .    Michelle Wilcox is a 78 y.o. female with visit-pertinent history of polymyalgia rheumatica, temporal arteritis, diastolic dysfunction and atrial fibrillation.  Patient presented to ED on 11/04/2023 with new onset atrial fibrillation characterized by rapid ventricular response, heart rate in the 150s.  Patient described a fluttering sensation in her chest.  Atrial fibrillation was identified following a cortisone injection in her knee.  In hospital patient reported shortness of breath, dizziness and palpitations for more than a few days, though exact duration was unclear.  She denied any recent chest pain.  Patient was planned for TEE/DCCV.  However TEE showed decreased EF of 40 to 45% and left atrial appendage thrombus so DCCV was unable to be performed.  Given decrease in EF diltiazem  was discontinued, was unable to add p.o. Lopressor  as blood pressures were soft.  Patient was loaded on digoxin .  An attempt for better rate control diltiazem  was started however this resulted in an episode of symptomatic hypotension and a rapid response with fluid bolus.  Patient was started on IV amiodarone  with goal of rate control only.  Patient was discharged on 11/07/2023 on metoprolol  succinate 50 mg twice daily, digoxin  0.125 mg daily and Eliquis .  Patient was started on amiodarone  400 mg twice daily for 5 days followed by 200 mg twice daily for 2 weeks followed by 200 mg daily thereafter.  Patient was seen in clinic on 11/19/2023.  She reported that she was doing well overall, reported that her heart rate had been well-controlled at home.  She reported some intermittent low blood pressures,  specifically when she was at PT earlier that week.  Noted that she had not been hydrating very well, noted improvement with increased hydration.  Patient was last seen in clinic on 12/12/2023, noted that she was having significant anxiety.  She reported that her heart rate at home had been very well-controlled and her blood pressures had improved.  She denied any chest pain, shortness of breath, lower extremity edema.  Patient was scheduled for TEE guided DCCV on May 1st with Dr. Santo.  TEE on 12/19/2023 indicated LVEF of 60 to 65%, RV systolic function was normal, RV size was mildly enlarged, left atrial size was severely dilated, no left atrial/left atrial appendage thrombus was detected, right atrial size was severely dilated, there was a central jet of atrial, functional MR with blunted pulmonary vein signal, mitral valve was normal in structure with moderate to severe mitral valve regurgitation, no evidence of mitral stenosis.  Atrial functional tricuspid regurgitation with 2 jets, tricuspid valve regurgitation was moderate.  There was a large central jet and a smaller, atrial jet between the non and left cusp, aortic valve regurgitation was moderate, no aortic valve stenosis was present.  Patient underwent DCCV with conversion from atrial fibrillation to normal sinus rhythm.  On 12/21/2023 patient return to the ER with palpitation and dyspnea, original ECG with atrial fibrillation.  She was started on IV amiodarone  with conversion to sinus rhythm with PACs.  Initial troponin 544, she was chest pain-free.  Echocardiogram during admission indicated LVEF of 60 to 65%, no RWMA, normal RV systolic function, moderately dilated LA, mild to moderate  AI.  BNP was elevated to 193.3, chest x-ray showed pulmonary vascular congestion, progressive edema, probable small pleural effusions.  Patient was diuresed with IV Lasix .  Patient was started on spironolactone  12.5 mg daily.  It was noted that her elevated  troponin was likely due to demand ischemia however recommended outpatient PET CT. The electrophysiology team was consulted while patient was in the hospital.  It was felt that patient was now paroxysmal while on amiodarone  as there were episodes of patient being in normal sinus rhythm with PACs on her low telemetry.  It was recommended that she wear a 2-week ZIO monitor on discharge to assess A-fib burden, noted that if she was in A-fib a majority of the time then risk of amiodarone  long-term may exceed benefits and transition to rate control strategy may be necessary.  Patient was seen in clinic on 01/10/2024, patient reported that she was fatigued easily when exerting herself and would have to stop and take breaks.  She denied any chest pain.  She reported since starting her new medication she overall simply does not feel well and is eager to explore other options.  Discussed with Dr. Anner, could potentially consider ablation which would likely require pacemaker placement.  Office discussed elevated troponins during recent hospitalization, per Dr. Anner would proceed with right and left heart catheterization given elevated troponins but also allow for evaluation of her valves.  Patient spironolactone  was discontinued.  Patient was last in clinic on 01/29/2024 she reported she was doing well overall.  She denied any chest pain, increased lower extremity edema, orthopnea or PND.  She reported fatigue and shortness of breath with ongoing exertion however felt this had been slightly improved.  Patient wondered if she had an allergy to furosemide , notes history of sulfa allergy and had been having red and itchy earlobes and scalp for the last 2 weeks.  Patient requested decreasing Lasix  back to 20 mg daily.  Patient was seen in clinic by Dr. Kennyth on 02/13/2024.  It was noted the patient had failed amiodarone  and other AAD's are unlikely to be successful.  Cardiac ablation was discussed, patient wished to think  about her options, if ablation was not pursued she would be in permanent atrial fibrillation.  Today she presents for follow-up.  She reports that she has been doing well overall. She denies any chest pain, lower extremity edema, orthopnea or pnd.  Patient reports that her dyspnea on exertion is at her recent baseline, denies any worsening, denies any significant weight gains.  She reports that she has not noticed a significant change since stopping her amiodarone , reports that her heart rate has remained well-controlled, she is tolerating increased doses of metoprolol  well.  She denies any palpitations.  Patient continues to note ongoing fatigue, reports that this has improved in recent weeks.  Patient reports that she is ready to proceed with cardiac catheterization, reports she is still thinking about ablation in the future.  ROS: .   Today she denies chest pain, lower extremity edema, palpitations, melena, hematuria, hemoptysis, diaphoresis, weakness, presyncope, syncope, orthopnea, and PND.  All other systems are reviewed and otherwise negative. Studies Reviewed: SABRA   EKG:  EKG is ordered today, personally reviewed, demonstrating  EKG Interpretation Date/Time:  Tuesday February 25 2024 14:49:25 EDT Ventricular Rate:  74 PR Interval:    QRS Duration:  80 QT Interval:  224 QTC Calculation: 248 R Axis:   34  Text Interpretation: Atrial fibrillation When compared with ECG of 10-Jan-2024 14:35,  Criteria for Inferior infarct are no longer Present Nonspecific T wave abnormality now evident in Inferior leads Confirmed by Simmie Garin [phone]) on 02/25/2024 4:21:28 PM   CV Studies: Cardiac studies reviewed are outlined and summarized above. Otherwise please see EMR for full report. Cardiac Studies & Procedures   ______________________________________________________________________________________________   STRESS TESTS  MYOCARDIAL PERFUSION IMAGING 03/11/2018  Interpretation Summary  The left  ventricular ejection fraction is hyperdynamic (>65%).  Nuclear stress EF: 69%.  There was no ST segment deviation noted during stress.  The study is normal.  This is a low risk study.  Low risk stress nuclear study with normal perfusion and normal left ventricular regional and global systolic function.   ECHOCARDIOGRAM  ECHOCARDIOGRAM COMPLETE 12/22/2023  Narrative ECHOCARDIOGRAM REPORT    Patient Name:   RITAJ DULLEA Date of Exam: 12/22/2023 Medical Rec #:  [id number]               Height:       63.0 in Accession #:    [id number]              Weight:       184.1 lb Date of Birth:  Mar 20, 1946               BSA:          1.867 m Patient Age:    77 years                BP:           118/56 mmHg Patient Gender: F                       HR:           71 bpm. Exam Location:  Inpatient  Procedure: 2D Echo (Both Spectral and Color Flow Doppler were utilized during procedure).  Indications:    acute diastolic chf  History:        Patient has prior history of Echocardiogram examinations, most recent 12/19/2023. Risk Factors:Hypertension and Dyslipidemia.  Sonographer:    Tinnie Barefoot RDCS Referring Phys: [id number] PETER C NISHAN  IMPRESSIONS   1. Left ventricular ejection fraction, by estimation, is 60 to 65%. The left ventricle has normal function. The left ventricle has no regional wall motion abnormalities. Left ventricular diastolic parameters are indeterminate. 2. Right ventricular systolic function is normal. The right ventricular size is normal. Tricuspid regurgitation signal is inadequate for assessing PA pressure. 3. Left atrial size was moderately dilated. 4. The mitral valve is normal in structure. Trivial mitral valve regurgitation. No evidence of mitral stenosis. 5. The aortic valve is tricuspid. Aortic valve regurgitation is mild to moderate. No aortic stenosis is present. 6. The inferior vena cava is dilated in size with >50% respiratory variability,  suggesting right atrial pressure of 8 mmHg.  FINDINGS Left Ventricle: Left ventricular ejection fraction, by estimation, is 60 to 65%. The left ventricle has normal function. The left ventricle has no regional wall motion abnormalities. The left ventricular internal cavity size was normal in size. There is no left ventricular hypertrophy. Left ventricular diastolic parameters are indeterminate.  Right Ventricle: The right ventricular size is normal. Right vetricular wall thickness was not well visualized. Right ventricular systolic function is normal. Tricuspid regurgitation signal is inadequate for assessing PA pressure.  Left Atrium: Left atrial size was moderately dilated.  Right Atrium: Right atrial size was normal in size.  Pericardium: There is no evidence of pericardial effusion.  Mitral Valve: The mitral valve is normal in structure. Trivial mitral valve regurgitation. No evidence of mitral valve stenosis.  Tricuspid Valve: The tricuspid valve is normal in structure. Tricuspid valve regurgitation is trivial. No evidence of tricuspid stenosis.  Aortic Valve: The aortic valve is tricuspid. Aortic valve regurgitation is mild to moderate. Aortic regurgitation PHT measures 385 msec. No aortic stenosis is present. Aortic valve mean gradient measures 4.4 mmHg. Aortic valve peak gradient measures 11.0 mmHg. Aortic valve area, by VTI measures 2.17 cm.  Pulmonic Valve: The pulmonic valve was not well visualized. Pulmonic valve regurgitation is not visualized. No evidence of pulmonic stenosis.  Aorta: The aortic root and ascending aorta are structurally normal, with no evidence of dilitation.  Venous: The inferior vena cava is dilated in size with greater than 50% respiratory variability, suggesting right atrial pressure of 8 mmHg.  IAS/Shunts: The interatrial septum was not well visualized.   LEFT VENTRICLE PLAX 2D LVIDd:         5.20 cm   Diastology LVIDs:         3.50 cm   LV e'  medial:    6.85 cm/s LV PW:         0.70 cm   LV E/e' medial:  11.8 LV IVS:        0.90 cm   LV e' lateral:   8.92 cm/s LVOT diam:     1.80 cm   LV E/e' lateral: 9.0 LV SV:         56 LV SV Index:   30 LVOT Area:     2.54 cm   RIGHT VENTRICLE          IVC RV Basal diam:  3.00 cm  IVC diam: 2.50 cm  LEFT ATRIUM           Index        RIGHT ATRIUM           Index LA diam:      4.60 cm 2.46 cm/m   RA Area:     16.40 cm LA Vol (A2C): 87.6 ml 46.93 ml/m  RA Volume:   40.70 ml  21.80 ml/m LA Vol (A4C): 87.3 ml 46.77 ml/m AORTIC VALVE AV Area (Vmax):    1.75 cm AV Area (Vmean):   2.00 cm AV Area (VTI):     2.17 cm AV Vmax:           165.60 cm/s AV Vmean:          93.719 cm/s AV VTI:            0.261 m AV Peak Grad:      11.0 mmHg AV Mean Grad:      4.4 mmHg LVOT Vmax:         114.00 cm/s LVOT Vmean:        73.700 cm/s LVOT VTI:          0.222 m LVOT/AV VTI ratio: 0.85 AI PHT:            385 msec  AORTA Ao Root diam: 2.90 cm Ao Asc diam:  3.30 cm  MITRAL VALVE MV Area (PHT): 3.60 cm    SHUNTS MV Decel Time: 211 msec    Systemic VTI:  0.22 m MV E velocity: 80.50 cm/s  Systemic Diam: 1.80 cm MV A velocity: 56.80 cm/s MV E/A ratio:  1.42  Dorn Ross MD Electronically signed by Dorn Ross MD Signature Date/Time: 12/22/2023/6:04:31 PM    Final  TEE  ECHO TEE 12/19/2023  Narrative TRANSESOPHOGEAL ECHO REPORT    Patient Name:   Sarahlynn Cisnero Date of Exam: 12/19/2023 Medical Rec #:  [id number]               Height:       63.0 in Accession #:    [id number]              Weight:       184.0 lb Date of Birth:  02/25/1946               BSA:          1.866 m Patient Age:    77 years                BP:           156/89 mmHg Patient Gender: F                       HR:           102 bpm. Exam Location:  Inpatient  Procedure: Transesophageal Echo, 3D Echo, Color Doppler, Cardiac Doppler and Intracardiac Opacification Agent (Both Spectral and Color  Flow Doppler were utilized during procedure).  Indications:     I48.91* Unspecified atrial fibrillation  History:         Patient has prior history of Echocardiogram examinations, most recent 11/05/2023. Arrythmias:Atrial Fibrillation; Risk Factors:Hypertension and Dyslipidemia.  Sonographer:     Damien Senior RDCS Referring Phys:  Maor Meckel D Catrell Morrone Diagnosing Phys: Stanly Leavens MD  PROCEDURE: After discussion of the risks and benefits of a TEE, an informed consent was obtained from the patient. The transesophogeal probe was passed without difficulty through the esophogus of the patient. Sedation performed by different physician. The patient was monitored while under deep sedation. Anesthestetic sedation was provided intravenously by Anesthesiology: 160mg  of Propofol . The patient developed no complications during the procedure. A successful direct current cardioversion was performed at 200 joules with 1 attempt.  IMPRESSIONS   1. Left ventricular ejection fraction, by estimation, is 60 to 65%. Left ventricular ejection fraction by 3D volume is 65 %. The left ventricle has normal function. 2. Right ventricular systolic function is normal. The right ventricular size is mildly enlarged. 3. Left atrial size was severely dilated. No left atrial/left atrial appendage thrombus was detected. 4. Right atrial size was severely dilated. 5. Central jet of atrial, functional MR with blunted pulmonary vein signal. The mitral valve is normal in structure. Moderate to severe mitral valve regurgitation. No evidence of mitral stenosis. 6. Atrial functional tricuspid regurgitation- two jets. Tricuspid valve regurgitation is moderate. 7. There is a larger central jet and a smaller, commissural jet between the non and left cusp; central jet 2DVC: 0.3 cm. The aortic valve is tricuspid. Aortic valve regurgitation is moderate. No aortic stenosis is present. 8. 3D performed of the LAA and demonstrates Patent  LAA- Chicken wing morphology. 23 mm maximal diameter. 9. Cannot exclude a small PFO.  FINDINGS Left Ventricle: Left ventricular ejection fraction, by estimation, is 60 to 65%. Left ventricular ejection fraction by 3D volume is 65 %. The left ventricle has normal function. Definity  contrast agent was given IV to delineate the left ventricular endocardial borders. The left ventricular internal cavity size was normal in size.  Right Ventricle: The right ventricular size is mildly enlarged. No increase in right ventricular wall thickness. Right ventricular systolic function is normal.  Left Atrium: Left atrial size was  severely dilated. No left atrial/left atrial appendage thrombus was detected.  Right Atrium: Right atrial size was severely dilated.  Pericardium: There is no evidence of pericardial effusion.  Mitral Valve: Central jet of atrial, functional MR with blunted pulmonary vein signal. The mitral valve is normal in structure. Moderate to severe mitral valve regurgitation. No evidence of mitral valve stenosis.  Tricuspid Valve: Atrial functional tricuspid regurgitation- two jets. The tricuspid valve is normal in structure. Tricuspid valve regurgitation is moderate . No evidence of tricuspid stenosis.  Aortic Valve: There is a larger central jet and a smaller, commissural jet between the non and left cusp; central jet 2DVC: 0.3 cm. The aortic valve is tricuspid. Aortic valve regurgitation is moderate. No aortic stenosis is present.  Pulmonic Valve: The pulmonic valve was normal in structure. Pulmonic valve regurgitation is trivial. No evidence of pulmonic stenosis.  Aorta: The aortic root and ascending aorta are structurally normal, with no evidence of dilitation.  IAS/Shunts: There is right bowing of the interatrial septum, suggestive of elevated left atrial pressure. Cannot exclude a small PFO.  Additional Comments: 3D was performed not requiring image post processing on an  independent workstation and was normal. Spectral Doppler performed.   3D Volume EF LV 3D EF:    Left ventricular ejection fraction by 3D volume is 65 %. LV 3D EDV:   47.36 ml LV 3D ESV:   16.50 ml  3D Volume EF: 3D EF:        65 %  Stanly Leavens MD Electronically signed by Stanly Leavens MD Signature Date/Time: 12/19/2023/12:39:33 PM    Final        ______________________________________________________________________________________________       Current Reported Medications:.    Current Meds  Medication Sig   acetaminophen  (TYLENOL ) 650 MG CR tablet Take 1,300 mg by mouth 2 (two) times daily.   ALPRAZolam  (XANAX ) 0.25 MG tablet Take 0.25 mg by mouth 2 (two) times daily as needed for anxiety.   apixaban  (ELIQUIS ) 5 MG TABS tablet Take 1 tablet (5 mg total) by mouth 2 (two) times daily.   cholecalciferol  (VITAMIN D3) 25 MCG (1000 UNIT) tablet Take 1,000 Units by mouth at bedtime. Take one capsule by mouth at bedtime every other day.   clindamycin (CLEOCIN) 150 MG capsule Take 600 mg by mouth See admin instructions. Take prior to Dental Procedures   diclofenac  Sodium (VOLTAREN ) 1 % GEL Apply 4 g topically as needed (Hands, knees, feet).   digoxin  (LANOXIN ) 0.125 MG tablet Take 1 tablet (0.125 mg total) by mouth daily.   diphenhydrAMINE  (BENADRYL ) 25 MG tablet Take 25-50 mg by mouth daily as needed for allergies (allergic reactions).   folic acid  (FOLVITE ) 1 MG tablet TAKE 1 TABLET(1 MG) BY MOUTH DAILY   furosemide  (LASIX ) 20 MG tablet Take 1 tablet (20 mg total) by mouth daily. Take an additional dose once a day as needed for lower extremity Edema.   gabapentin  (NEURONTIN ) 100 MG capsule Take 200 mg by mouth 2 (two) times daily.   lactase (LACTAID) 3000 units tablet Take 9,000-15,000 Units by mouth as needed (consuming dairy products).   loratadine  (CLARITIN ) 10 MG tablet Take 10 mg by mouth daily.   metoprolol  succinate (TOPROL -XL) 50 MG 24 hr tablet Take 1.5  tablets (75 mg total) by mouth in the morning and at bedtime. Take with or immediately following a meal.   Multiple Vitamin (MULTIVITAMIN) tablet Take 1 tablet by mouth at bedtime. One-A-Day Women's Vitamin   potassium chloride  SA (KLOR-CON   M) 20 MEQ tablet Take 1 tablet (20 mEq total) by mouth daily.   predniSONE  (DELTASONE ) 5 MG tablet Take 1 tablet (5 mg total) by mouth daily with breakfast.   Probiotic Product (ALIGN) 4 MG CAPS Take 4 mg by mouth every morning.   rosuvastatin  (CRESTOR ) 5 MG tablet Take 5 mg by mouth at bedtime. Take one tablet by mouth at bedtime every other day.   VENTOLIN  HFA 108 (90 Base) MCG/ACT inhaler Take 1-2 puffs by mouth every 4 (four) hours as needed for shortness of breath.    Physical Exam:    VS:  BP 126/81   Pulse 74   Ht 5' 3 (1.6 m)   Wt 178 lb (80.7 kg)   SpO2 99%   BMI 31.53 kg/m    Wt Readings from Last 3 Encounters:  02/25/24 178 lb (80.7 kg)  02/13/24 179 lb (81.2 kg)  01/29/24 181 lb (82.1 kg)    GEN: Well nourished, well developed in no acute distress NECK: No JVD; No carotid bruits CARDIAC: irregular R irregular R, no murmurs, rubs, gallops RESPIRATORY:  Clear to auscultation without rales, wheezing or rhonchi  ABDOMEN: Soft, non-tender, non-distended EXTREMITIES:  No edema; No acute deformity     Asessement and Plan:.    Elevated troponin/valve disease:  During admission in 12/2023 patient's troponin level increased from 28>>544.  Ischemic evaluation was not pursued while inpatient, felt to be related to demand ischemia from A-fib and hypervolemia with recommended ischemic evaluation in outpatient setting.  TEE on 5/1 noted moderate to severe mitral valve regurgitation (functional MR), moderate TR, moderate AI.  Echo on 12/22/2023 indicated EF 60 to 65%, trivial MR, mild to moderate aortic valve regurgitation.  Previously reviewed with Dr. Anner, he recommended a right and left heart catheterization given positive troponin while  inpatient, as this would also allow for evaluation of valves and pressures. Today she reports that she is doing well, continues to note ongoing fatigue however improved as well as dyspnea on exertion.  She denies any significant chest pain.  Notes her activity tolerance remains reduced, continues to work with physical therapy.  Discussed Dr. Genice recommendation for right and left cardiac catheterization, patient in agreement please see consent below.  Patient has been scheduled for right and left heart catheterization with Dr. Anner on 03/04/2024.  She will hold Eliquis  for 2 days prior to procedure. Check CBC, CMET and digoxin  level today.  Informed Consent   Shared Decision Making/Informed Consent The risks [stroke (1 in 1000), death (1 in 1000), kidney failure [usually temporary] (1 in 500), bleeding (1 in 200), allergic reaction [possibly serious] (1 in 200)], benefits (diagnostic support and management of coronary artery disease) and alternatives of a cardiac catheterization were discussed in detail with Ms. Maring and she is willing to proceed.     Persistent atrial fibrillation: Patient first diagnosed with A-fib in 10/2023, started on amiodarone , digoxin  and metoprolol . She underwent TEE guided DCCV on 5/1, TEE noted severe biatrial enlargement, moderate to severe MR, moderate TR. Patient presented 5/2 with shortness of breath was found to be back in A-fib with RVR with evidence of hypervolemia. She was again loaded with IV amiodarone . Noted it would likely be difficult to maintain sinus rhythm given biatrial enlargement and valvular disease. Patient now being followed by EP, cardiac monitor showed 100% burden of atrial fibrillation.  She was seen by Dr. Kennyth on 6/26 with recommendation for consideration of ablation, patient continues think of her options. Patient denies  any bleeding problems on Eliquis , reports numerous medications.  Continue metoprolol  succinate 50 mg twice daily, digoxin ,  Eliquis  5 mg twice daily. Check Digoxin  level today.   Chronic diastolic heart failure: Echocardiogram on 12/23/2023 indicated LVEF 66 5%, no RWMA, normal RV systolic function, moderately dilated LA, mild to moderate AI. Patient's BNP was elevated to 193.3, chest x-ray showed pulmonary vascular congestion, progressive edema, probable small pleural effusions, she underwent IV diuresis.  She was discharged on spironolactone  12.5 mg daily and Lasix  40 mg daily however patient on follow-up reported low blood pressures with increased fatigue and weakness.  Per Dr. Anner discontinued spironolactone . At last office visit patient requested reduction in dose of Lasix  to 20 mg daily as she felt she was having allergic reactions consisting of an itchy scalp and earlobes on higher doses, this was reduced. Today she reports that she is doing well, reports that her dyspnea on exertion is at baseline, denies any shortness of breath at rest, orthopnea or PND.  She denies any lower extremity edema.  Patient reports that she has done well on Lasix  20 mg daily.  Today she appears euvolemic and well compensated on exam.  Continue Lasix  20 mg daily.  Hyperlipidemia: Continue Crestor  5 mg every other the day. Check fasting lipid profile and CMET.    Disposition: F/u with Dr. Anner in 03/2024 as scheduled.   Signed, Yarel Kilcrease D Dawnielle Christiana, NP

## 2024-02-25 ENCOUNTER — Ambulatory Visit: Admitting: Physical Therapy

## 2024-02-25 ENCOUNTER — Encounter: Payer: Self-pay | Admitting: Cardiology

## 2024-02-25 ENCOUNTER — Ambulatory Visit: Attending: Cardiology | Admitting: Cardiology

## 2024-02-25 VITALS — BP 126/81 | HR 74 | Ht 63.0 in | Wt 178.0 lb

## 2024-02-25 DIAGNOSIS — I351 Nonrheumatic aortic (valve) insufficiency: Secondary | ICD-10-CM | POA: Diagnosis not present

## 2024-02-25 DIAGNOSIS — I4819 Other persistent atrial fibrillation: Secondary | ICD-10-CM

## 2024-02-25 DIAGNOSIS — R7989 Other specified abnormal findings of blood chemistry: Secondary | ICD-10-CM

## 2024-02-25 DIAGNOSIS — E782 Mixed hyperlipidemia: Secondary | ICD-10-CM

## 2024-02-25 DIAGNOSIS — I5032 Chronic diastolic (congestive) heart failure: Secondary | ICD-10-CM

## 2024-02-25 DIAGNOSIS — I340 Nonrheumatic mitral (valve) insufficiency: Secondary | ICD-10-CM | POA: Diagnosis not present

## 2024-02-25 DIAGNOSIS — I5020 Unspecified systolic (congestive) heart failure: Secondary | ICD-10-CM | POA: Diagnosis not present

## 2024-02-25 NOTE — Patient Instructions (Signed)
 Medication Instructions:  Your physician recommends that you continue on your current medications as directed. Please refer to the Current Medication list given to you today.  *If you need a refill on your cardiac medications before your next appointment, please call your pharmacy*  Lab Work: CBC, CMET, Digoxin  level today Fasting lipid panel in 2 weeks  Testing/Procedures: Your physician has requested that you have a left & right cardiac catheterization. Cardiac catheterization is used to diagnose and/or treat various heart conditions. Doctors may recommend this procedure for a number of different reasons. The most common reason is to evaluate chest pain. Chest pain can be a symptom of coronary artery disease (CAD), and cardiac catheterization can show whether plaque is narrowing or blocking your heart's arteries. This procedure is also used to evaluate the valves, as well as measure the blood flow and oxygen levels in different parts of your heart. For further information please visit [email or web address] Please follow instruction sheet, as given.   Follow-Up: At [facility], you and your health needs are our priority.  As part of our continuing mission to provide you with exceptional heart care, our providers are all part of one team.  This team includes your primary Cardiologist (physician) and Advanced Practice Providers or APPs (Physician Assistants and Nurse Practitioners) who all work together to provide you with the care you need, when you need it.  Your next appointment:    Keep appt with Dr. Anner  Provider:   Alm Anner, MD    We recommend signing up for the patient portal called MyChart.  Sign up information is provided on this After Visit Summary.  MyChart is used to connect with patients for Virtual Visits (Telemedicine).  Patients are able to view lab/test results, encounter notes, upcoming appointments, etc.  Non-urgent messages can be sent to your provider as  well.   To learn more about what you can do with MyChart, go to [email or web address].   Other Instructions  [place] [facility] A DEPT OF [facility] AT [address] A DEPT OF THE [place] [address] [place] [address] Dept: [phone] Loc: [phone]  Michelle Wilcox  02/25/2024  You are scheduled for a Cardiac Catheterization on Wednesday, July 16 with Dr. Alm Anner.  1. Please arrive at the [facility] (Main Entrance A) at [facility]: [address] [place], [place] [address] at 6:30 AM (This time is 2 hour(s) before your procedure to ensure your preparation).   Free valet parking service is available. You will check in at ADMITTING. The support person will be asked to wait in the waiting room.  It is OK to have someone drop you off and come back when you are ready to be discharged.    Special note: Every effort is made to have your procedure done on time. Please understand that emergencies sometimes delay scheduled procedures.  2. Diet: Do not eat solid foods after midnight.  The patient may have clear liquids until 5am upon the day of the procedure.  3. Labs: Completed today 4. Medication instructions in preparation for your procedure:   Contrast Allergy: No   Current Outpatient Medications (Endocrine & Metabolic):    predniSONE  (DELTASONE ) 5 MG tablet, Take 1 tablet (5 mg total) by mouth daily with breakfast.  Current Outpatient Medications (Cardiovascular):    digoxin  (LANOXIN ) 0.125 MG tablet, Take 1 tablet (0.125 mg total) by mouth daily.   furosemide  (LASIX ) 20 MG  tablet, Take 1 tablet (20 mg total) by mouth daily. Take an additional dose once a day as needed for lower extremity Edema.   metoprolol  succinate (TOPROL -XL) 50 MG 24 hr tablet, Take 1.5 tablets (75 mg total) by mouth in the morning and at bedtime. Take with or immediately following a meal.   rosuvastatin  (CRESTOR ) 5 MG tablet, Take 5  mg by mouth at bedtime. Take one tablet by mouth at bedtime every other day.  Current Outpatient Medications (Respiratory):    diphenhydrAMINE  (BENADRYL ) 25 MG tablet, Take 25-50 mg by mouth daily as needed for allergies (allergic reactions).   loratadine  (CLARITIN ) 10 MG tablet, Take 10 mg by mouth daily.   VENTOLIN  HFA 108 (90 Base) MCG/ACT inhaler, Take 1-2 puffs by mouth every 4 (four) hours as needed for shortness of breath.  Current Outpatient Medications (Analgesics):    acetaminophen  (TYLENOL ) 650 MG CR tablet, Take 1,300 mg by mouth 2 (two) times daily.  Current Outpatient Medications (Hematological):    apixaban  (ELIQUIS ) 5 MG TABS tablet, Take 1 tablet (5 mg total) by mouth 2 (two) times daily.   folic acid  (FOLVITE ) 1 MG tablet, TAKE 1 TABLET(1 MG) BY MOUTH DAILY  Current Outpatient Medications (Other):    ALPRAZolam  (XANAX ) 0.25 MG tablet, Take 0.25 mg by mouth 2 (two) times daily as needed for anxiety.   cholecalciferol  (VITAMIN D3) 25 MCG (1000 UNIT) tablet, Take 1,000 Units by mouth at bedtime. Take one capsule by mouth at bedtime every other day.   clindamycin (CLEOCIN) 150 MG capsule, Take 600 mg by mouth See admin instructions. Take prior to Dental Procedures   diclofenac  Sodium (VOLTAREN ) 1 % GEL, Apply 4 g topically as needed (Hands, knees, feet).   gabapentin  (NEURONTIN ) 100 MG capsule, Take 200 mg by mouth 2 (two) times daily.   lactase (LACTAID) 3000 units tablet, Take 9,000-15,000 Units by mouth as needed (consuming dairy products).   Multiple Vitamin (MULTIVITAMIN) tablet, Take 1 tablet by mouth at bedtime. One-A-Day Women's Vitamin   potassium chloride  SA (KLOR-CON  M) 20 MEQ tablet, Take 1 tablet (20 mEq total) by mouth daily.   Probiotic Product (ALIGN) 4 MG CAPS, Take 4 mg by mouth every morning. *For reference purposes while preparing patient instructions.   Delete this med list prior to printing instructions for patient.*  HOLD ELIQUIS   2 DAYS PRIOR TO  PROCEDURE  On the morning of your procedure, take your Aspirin 81 mg and any morning medicines NOT listed above.  You may use sips of water.  5. Plan to go home the same day, you will only stay overnight if medically necessary. 6. Bring a current list of your medications and current insurance cards. 7. You MUST have a responsible person to drive you home. 8. Someone MUST be with you the first 24 hours after you arrive home or your discharge will be delayed. 9. Please wear clothes that are easy to get on and off and wear slip-on shoes.  Thank you for allowing us  to care for you!   -- [facility]

## 2024-02-26 ENCOUNTER — Ambulatory Visit: Payer: Self-pay | Admitting: Cardiology

## 2024-02-26 ENCOUNTER — Telehealth: Payer: Self-pay | Admitting: Cardiology

## 2024-02-26 DIAGNOSIS — I4819 Other persistent atrial fibrillation: Secondary | ICD-10-CM

## 2024-02-26 DIAGNOSIS — I5032 Chronic diastolic (congestive) heart failure: Secondary | ICD-10-CM

## 2024-02-26 DIAGNOSIS — Z79899 Other long term (current) drug therapy: Secondary | ICD-10-CM

## 2024-02-26 DIAGNOSIS — I340 Nonrheumatic mitral (valve) insufficiency: Secondary | ICD-10-CM

## 2024-02-26 LAB — COMPREHENSIVE METABOLIC PANEL WITH GFR
ALT: 12 IU/L (ref 0–32)
AST: 26 IU/L (ref 0–40)
Albumin: 4.4 g/dL (ref 3.8–4.8)
Alkaline Phosphatase: 85 IU/L (ref 44–121)
BUN/Creatinine Ratio: 14 (ref 12–28)
BUN: 12 mg/dL (ref 8–27)
Bilirubin Total: 0.3 mg/dL (ref 0.0–1.2)
CO2: 25 mmol/L (ref 20–29)
Calcium: 9.6 mg/dL (ref 8.7–10.3)
Chloride: 102 mmol/L (ref 96–106)
Creatinine, Ser: 0.85 mg/dL (ref 0.57–1.00)
Globulin, Total: 1.9 g/dL (ref 1.5–4.5)
Glucose: 87 mg/dL (ref 70–99)
Potassium: 5.1 mmol/L (ref 3.5–5.2)
Sodium: 145 mmol/L — AB (ref 134–144)
Total Protein: 6.3 g/dL (ref 6.0–8.5)
eGFR: 70 mL/min/1.73 (ref >59)

## 2024-02-26 LAB — CBC
Hematocrit: 44.4 % (ref 34.0–46.6)
Hemoglobin: 14.9 g/dL (ref 11.1–15.9)
MCH: 32.7 pg (ref 26.6–33.0)
MCHC: 33.6 g/dL (ref 31.5–35.7)
MCV: 98 fL — ABNORMAL HIGH (ref 79–97)
Platelets: 287 x10E3/uL (ref 150–450)
RBC: 4.55 x10E6/uL (ref 3.77–5.28)
RDW: 13.4 % (ref 11.7–15.4)
WBC: 10.2 x10E3/uL (ref 3.4–10.8)

## 2024-02-26 LAB — DIGOXIN LEVEL: Digoxin, Serum: 1 ng/mL — AB (ref 0.5–0.9)

## 2024-02-26 MED ORDER — POTASSIUM CHLORIDE ER 10 MEQ PO TBCR: 10.0000 meq | EXTENDED_RELEASE_TABLET | Freq: Every day | ORAL | 3 refills | Status: DC

## 2024-02-26 MED ORDER — DIGOXIN 125 MCG PO TABS: 0.0625 mg | ORAL_TABLET | Freq: Every day | ORAL | 3 refills | Status: DC

## 2024-02-26 NOTE — Telephone Encounter (Signed)
 Patient stated a fax is being sent today from [employer] to extend her short term disability which will need to be completed, signed and returned as soon as possible.  Patient stated the Return to Work date should read 03/21/24.   Patient also wants to know if she would need to do her lab work before or after procedure scheduled on 7/16.

## 2024-02-26 NOTE — Telephone Encounter (Signed)
-----   Message from Rosabel JONETTA Mose sent at 02/26/2024 12:13 PM EDT ----- Please let Ms. Reino know that her CBC shows no evidence of anemia or infection, her kidney function and electrolytes are overall normal. Her potassium is on the higher side of normal, recommend  decreasing her potassium to 10 meq daily.  Her liver function is normal. Her digoxin  level remains slightly elevated, recommend holding for two days then start digoxin  0.0625 mg daily. Repeat BMET  and digoxin  in two weeks for monitoring (at least six hours after her morning dose of digoxin ). Also on review of her chart, I would recommend a two week follow up post cath given that her  appointment with Dr. Anner is in late August.  ----- Message ----- From: Interface, [facility] Lab Results In Sent: 02/25/2024  11:35 PM EDT To: Katlyn D West, NP

## 2024-02-26 NOTE — Telephone Encounter (Signed)
 Called patient advised of below they verbalized understanding Ordered labs & medications

## 2024-02-27 ENCOUNTER — Ambulatory Visit

## 2024-02-27 DIAGNOSIS — R2681 Unsteadiness on feet: Secondary | ICD-10-CM | POA: Diagnosis not present

## 2024-02-27 DIAGNOSIS — R262 Difficulty in walking, not elsewhere classified: Secondary | ICD-10-CM | POA: Diagnosis not present

## 2024-02-27 DIAGNOSIS — R2689 Other abnormalities of gait and mobility: Secondary | ICD-10-CM

## 2024-02-27 DIAGNOSIS — M6281 Muscle weakness (generalized): Secondary | ICD-10-CM

## 2024-02-27 DIAGNOSIS — R29898 Other symptoms and signs involving the musculoskeletal system: Secondary | ICD-10-CM | POA: Diagnosis not present

## 2024-02-27 NOTE — Telephone Encounter (Signed)
 I received the [employer] form this morning.  I will place in Michelle Wilcox's box along with a copy of the same form completed last month.  This form is to extend her Short Term Disability to 03/21/2024.

## 2024-02-27 NOTE — Therapy (Signed)
 OUTPATIENT PHYSICAL THERAPY NEURO TREATMENT   Patient Name: Michelle Wilcox MRN: [id number] DOB:01/19/1946, 78 y.o., female Today's Date: 02/27/2024  PCP: Aisha Harvey, MD REFERRING PROVIDER: Christobal Guadalajara, MD      END OF SESSION:  PT End of Session - 02/27/24 1447     Visit Number 26    Number of Visits 27    Date for PT Re-Evaluation 03/12/24    Authorization Type BCBS Medicare    Progress Note Due on Visit 28    PT Start Time 1447    PT Stop Time 1530    PT Time Calculation (min) 43 min    Equipment Utilized During Treatment Gait belt    Activity Tolerance Patient tolerated treatment well    Behavior During Therapy WFL for tasks assessed/performed                    Past Medical History:  Diagnosis Date   Anxiety    hx of   Asthma    at times   CHF (congestive heart failure) (HCC)    Depression    hx of   Dyspnea    Giant cell arteritis (HCC)    Gouty arthropathy    Hemolytic anemia (HCC)    Hyperlipidemia    Hypertension    Irregular heart beat    extra beat   Migraines    Polymyalgia (HCC)    Polymyalgia rheumatica (HCC)    Temporal arteritis (HCC)    Past Surgical History:  Procedure Laterality Date   BARTHOLIN GLAND CYST EXCISION     non ca   BREAST BIOPSY Right    CARDIOVERSION N/A 11/05/2023   Procedure: CARDIOVERSION;  Surgeon: Sheena Pugh, DO;  Location: [facility];  Service: Cardiovascular;  Laterality: N/A;   CARDIOVERSION N/A 12/19/2023   Procedure: CARDIOVERSION;  Surgeon: Santo Stanly LABOR, MD;  Location: [facility];  Service: Cardiovascular;  Laterality: N/A;   EYE MUSCLE SURGERY     as a child 23 yrs old   KNEE SURGERY  2004   rt knee   LAPAROSCOPIC SPLENECTOMY N/A 06/18/2018   Procedure: LAPAROSCOPIC SPLENECTOMY ERAS PATHWAY;  Surgeon: Mikell Katz, MD;  Location: [facility] ORS;  Service: General;  Laterality: N/A;   toncil     TONSILLECTOMY     as a child   TRANSESOPHAGEAL ECHOCARDIOGRAM  (CATH LAB) N/A 11/05/2023   Procedure: TRANSESOPHAGEAL ECHOCARDIOGRAM;  Surgeon: Sheena Pugh, DO;  Location: [facility];  Service: Cardiovascular;  Laterality: N/A;   TRANSESOPHAGEAL ECHOCARDIOGRAM (CATH LAB) N/A 12/19/2023   Procedure: TRANSESOPHAGEAL ECHOCARDIOGRAM;  Surgeon: Santo Stanly LABOR, MD;  Location: [facility];  Service: Cardiovascular;  Laterality: N/A;   Patient Active Problem List   Diagnosis Date Noted   Acute on chronic congestive heart failure (HCC) 12/24/2023   Acute on chronic HFrEF (heart failure with reduced ejection fraction) (HCC) 12/21/2023   Atrial fibrillation with rapid ventricular response (HCC) 12/21/2023   Atrial fibrillation, rapid (HCC) 12/21/2023   Pericardial effusion 11/07/2023   Atrial fibrillation with RVR (HCC) 11/05/2023   Borderline abnormal TFTs 11/05/2023   Macrocytosis 11/05/2023   Atrial fibrillation (HCC) 11/04/2023   PMR (polymyalgia rheumatica) (HCC) 11/04/2023   Primary localized osteoarthrosis of multiple sites 06/15/2022   Diastolic dysfunction without heart failure 03/05/2018   DOE (dyspnea on exertion) 03/05/2018   Preop cardiovascular exam 03/05/2018   Mild reactive airways disease 08/04/2017   Volume overload 08/03/2017   Hypokalemia 08/03/2017  Essential hypertension 08/03/2017   Hyperlipidemia 08/03/2017   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   Hypersensitivity reaction 11/20/2016   Drusen (degenerative) of retina, bilateral 05/22/2016   Nuclear cataract 05/22/2016   Retinal hemorrhage of left eye 05/22/2016   Temporal arteritis (HCC) 05/22/2016   Hemolytic anemia (HCC) 03/19/2016   Breast mass, right 07/01/2012    ONSET DATE: 11/04/23  REFERRING DIAG: I48.91 (ICD-10-CM) - Atrial fibrillation with RVR (HCC)  THERAPY DIAG:  Other abnormalities of gait and mobility  Unsteadiness on feet  Muscle weakness (generalized)  Other symptoms and signs involving the musculoskeletal  system  Difficulty in walking, not elsewhere classified  Rationale for Evaluation and Treatment: Rehabilitation  SUBJECTIVE:                                                                                                                                                                                             SUBJECTIVE STATEMENT: Work 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 HR and BP have been doing better, but still in A-fib all the time.   Pt accompanied by: self PERTINENT HISTORY: 12/31/2023:  Pt underwent attempt at cardioversion for a-fib on 12/19/23, which did not last >24 hrs; was hospitalized after ED visit for SOB, CHF (*New orders received for PT resume and treat).  Anxiety, asthma, CHF, depression, gouty arthropathy, anemia, HLD, HTN, migraines, polymyalgia rheumatica, temporal arteritis, R knee surgery   PAIN:  Are you having pain? Yes: NPRS scale: very slight in my knee Pain location: R knee Pain description: sore Aggravating factors: previous fall Relieving factors: knee brace  PRECAUTIONS: Fall; per Cardiac NP's recommendation: goal resting HR is <110bpm. goal heart rate is <135 bpm with exertion.  12/31/2023:  Have HR monitor (will wear for 14 days).  Monitor BP  WEIGHT BEARING RESTRICTIONS: No  FALLS: Has patient fallen in last 6 months? Yes. Number of falls 1  LIVING ENVIRONMENT: Lives with: lives with their spouse who has stage 4 stomach CA (pt reports that she is having to physically assist him some) Lives in: Other townhouse  Stairs: 2 stories, sleeps on 1st floor in recliner  Has following equipment at home: Environmental consultant - 2 wheeled, cane  PLOF: Independent; currently getting assistance with meals   PATIENT GOALS: improve fatigue   OBJECTIVE:    TODAY'S TREATMENT: 02/27/24 Activity Comments  98%, 80 bpm   treadmill 2.5 min warm-up at 1.7 mph 30 sec 2.0 mph x 2 1.8 min x 60 sec x 2 Vitals  monitored throughout 103 bpm during efforts, 82 bpm at rest (Break  at 5 min). Completed 2 rounds  Vitals: 159/94 mmHg, 86 bpm 152/97 mmHg, 88 bpm    Gait training -Trials in stepping over, around, on/off obstacles x 2 min rounds and one round carrying 8# -retrowalking x 2 min supervision           TODAY'S TREATMENT: 02/24/24 Activity Comments  Nustep L6 x 7 min UEs/LEs  Good tolerance   superset high knee march  backwards walking  CGA d/t pt's hesitance but good stability   superset step up + opposite SKTC 2x10  4 square step over yellow therapy poles Requires B UE support for step ups and pt's walking stick for 4 square step, then weaned to only posterior steps   superset toe tap to 2 cones bending to pick up beanbags from floor No UE support required. Good stability           PATIENT EDUCATION: Education details: discussed POC, potential return to work, upcoming cardiac procedures and potential for 30 day hold  Person educated: Patient Education method: Explanation Education comprehension: verbalized understanding    HOME EXERCISE PROGRAM Last updated: 02/14/24 Access Code: [email or web address] URL: [email or web address] Date: 02/14/2024 Prepared by: [facility]  Rehab - [facility]  Program Notes you can get a pair of adjustable ankle weights ranging from 1-5 lbs from [email or web address]   Exercises - Sit to Stand with Arms Crossed  - 1 x daily - 5 x weekly - 2 sets - 10 reps - Standing Hamstring Curl with Chair Support  - 1 x daily - 7 x weekly - 3 sets - 10-15 reps - Standing March with Counter Support  - 1 x daily - 7 x weekly - 2-3 sets - 1 min hold - Side Stepping with Counter Support  - 1 x daily - 5 x weekly - 2 sets - 1 min hold - Forward Step Up  - 1 x daily - 5 x weekly - 2-3 sets - 30 sec hold      ______________________________________________ Note: Objective measures were completed at Evaluation unless otherwise noted.  Vitals: 96%  spO2, 97 bpm (102 bpm taken manually),  115/85 mmHg  DIAGNOSTIC FINDINGS: 11/05/23 echo TEE: Left Atrium: Left atrial size was severely dilated. A left atrial/left  atrial appendage thrombus was detected.   COGNITION: Overall cognitive status: Within functional limits for tasks assessed   SENSATION: WFL  POSTURE: rounded shoulders and forward head  LOWER EXTREMITY MMT:   MMT (in sitting) Right Eval Left Eval  Hip flexion 4+ 4+  Hip extension    Hip abduction 4+ 4+  Hip adduction 4+ 4+  Hip internal rotation    Hip external rotation    Knee flexion 4+ 4+  Knee extension 4+ 4  Ankle dorsiflexion 4+ 4+  Ankle plantarflexion 4 4  Ankle inversion    Ankle eversion    (Blank rows = not tested)  GAIT: Gait pattern: slow gait speed, pushing walker too far forward Assistive device utilized: Walker - 2 wheeled Level of assistance: SBA  FUNCTIONAL TESTS:  5xSTS: 26.23 sec pushing off knees and audible R knee crepitus  Pre: 97% spO2, 89bpm  Post: 95% spO2, 102bpm   70M walk test: 17.53 sec with RW (1.87 ft/sec)  __________________________________________________________________________________ GOALS: Goals reviewed with patient? Yes  SHORT TERM GOALS: Target date: 12/05/2023  Patient to be independent with initial HEP. Baseline: HEP initiated Goal status: MET  LONG TERM GOALS: Target date: 03/12/2024    Patient to be independent with advanced HEP. Baseline: Not yet initiated  Goal status: IN PROGRESS, 12/31/2023  Patient to demonstrate gait speed of at least 2.0 ft/sec in order to improve access to community.  Baseline: 1.87 ft/sec 12/31/2023; 12.09 sec with RW (2.7 ft/sec) 01/30/24 Goal status: MET  01/30/24  Patient to report tolerance for grocery trip without fatigue limiting.    Baseline: unable; She has been able to tolerate short trips lasting ~10 minutes  with her walker but recently went to [employer] at tried a long trip and it felt like it was too much. 01/30/24 Goal status: IN PROGRESS, 01/30/24  6 min walk test to improve to >600 ft with device as needed Baseline: 437 ft>710 ft in 6 MWT 12/17/23 Goal status: MET 12/17/2023  Patient to demonstrate 5xSTS test in <15 sec in order to decrease risk of falls.  Baseline: 26.23 sec pushing off knees and audible R knee crepitus; 45.41 seconds with arms crossed at chest 5/13; 15.9 sec without UEs 01/30/24 Goal status: IN PROGRESS,  01/30/24  6 min walk test to improve to at least 1,000 ft with device as needed Baseline: 838 feet with RW 01/30/24 Goal status: IN PROGRESS      ASSESSMENT:  CLINICAL IMPRESSION:  Instructed in treadmill training to improve gait speed and endurance with use of intervals at faster pace with vitals monitored  during bouts maintaining HR < 110 bpm and pt demo good tolerance with rest period at 5 min.  Dynamic gait activities to improve safety with ambulation and obstacle negotiation for return to work preparation performed in time format to improve endurance w/ seated rest periods Q 2-4 min.  SBA when negotiating curb height step w/out AD but ambulation level surfaces w/ independence and with carrying unilat load.  Overall, improved activity tolerance and stable vital signs throughout. Continued sessions to progress for D/C  OBJECTIVE IMPAIRMENTS: Abnormal gait, cardiopulmonary status limiting activity, decreased activity tolerance, decreased balance, difficulty walking, decreased strength, and pain.   PLAN:  PT FREQUENCY: 2x/week for 3 weeks followed by 1x/week for 3 weeks  PT DURATION: -  PLANNED INTERVENTIONS: [id number]- PT Re-evaluation, 97110-Therapeutic exercises, 97530- Therapeutic activity, [id number]- Neuromuscular re-education, 97535- Self Care, [id number]- Manual therapy, [id number]- Gait training, [phone]- Canalith repositioning, [phone]- Aquatic Therapy, Patient/Family  education, Balance training, Stair training, Taping, Dry Needling, Vestibular training, DME instructions, Cryotherapy, and Moist heat  PLAN FOR NEXT SESSION: Consider balance-specific activities in regards to potential for work tasks without cane.  Gait with obstacle negotiation.  Continue to progress balance/gait/endurance while monitoring vitals, Exercises to decrease reliance on UE support with gait, gait with carry task. Ready for D/C?  4:15 PM, 02/27/24 M. Kelly Hanya Guerin, PT, DPT Physical Therapist- [place] Office Number: [phone]

## 2024-02-28 NOTE — Telephone Encounter (Signed)
 The [employer] form has been faxed to insurance and scanned into chart.

## 2024-02-28 NOTE — Telephone Encounter (Signed)
 [employer] form completed and given to [employer] CMA on 7/10 at 5:30 pm.

## 2024-03-02 ENCOUNTER — Telehealth: Payer: Self-pay | Admitting: Cardiology

## 2024-03-02 NOTE — Telephone Encounter (Signed)
 Spoke with pt who would like to confirm medication instructions for her cath on 7/16.  Pt advised she was to hold Eliquis  x 2 days prior to cath and to make sure she takes ASA 81mg  the morning of her cath,  Pt should hold her Furosemide  and KCL the morning of procedure.  Pt may hold her OTC medications until she returns home that day but may take other medications with enough water to get them down safely.  Pt verbalizes understanding and thanked Charity fundraiser for the call.

## 2024-03-02 NOTE — Telephone Encounter (Signed)
 Pt requesting cb regarding cath and medication to take/hold

## 2024-03-03 ENCOUNTER — Ambulatory Visit: Admitting: Physical Therapy

## 2024-03-03 ENCOUNTER — Encounter: Payer: Self-pay | Admitting: Physical Therapy

## 2024-03-03 ENCOUNTER — Telehealth: Payer: Self-pay | Admitting: *Deleted

## 2024-03-03 DIAGNOSIS — M6281 Muscle weakness (generalized): Secondary | ICD-10-CM

## 2024-03-03 DIAGNOSIS — R2681 Unsteadiness on feet: Secondary | ICD-10-CM

## 2024-03-03 DIAGNOSIS — R262 Difficulty in walking, not elsewhere classified: Secondary | ICD-10-CM | POA: Diagnosis not present

## 2024-03-03 DIAGNOSIS — R2689 Other abnormalities of gait and mobility: Secondary | ICD-10-CM

## 2024-03-03 DIAGNOSIS — R29898 Other symptoms and signs involving the musculoskeletal system: Secondary | ICD-10-CM | POA: Diagnosis not present

## 2024-03-03 NOTE — Therapy (Signed)
 OUTPATIENT PHYSICAL THERAPY NEURO TREATMENT/RECERT/PROGRESS NOTE   Patient Name: Michelle Wilcox MRN: [id number] DOB:[birth year], 78 y.o., female Today's Date: 03/03/2024  PCP: Aisha Harvey, MD REFERRING PROVIDER: Christobal Guadalajara, MD    Progress Note Reporting Period 01/30/2024 to 03/03/2024  See note below for Objective Data and Assessment of Progress/Goals.      END OF SESSION:  PT End of Session - 03/03/24 1404     Visit Number 27    Number of Visits 31    Date for PT Re-Evaluation 04/10/24    Authorization Type [employer] Medicare    Progress Note Due on Visit --   PN done at visit 27   PT Start Time 1405    PT Stop Time 1445    PT Time Calculation (min) 40 min    Equipment Utilized During Treatment Gait belt    Activity Tolerance Patient tolerated treatment well    Behavior During Therapy WFL for tasks assessed/performed                    Past Medical History:  Diagnosis Date   Anxiety    hx of   Asthma    at times   CHF (congestive heart failure) (HCC)    Depression    hx of   Dyspnea    Giant cell arteritis (HCC)    Gouty arthropathy    Hemolytic anemia (HCC)    Hyperlipidemia    Hypertension    Irregular heart beat    extra beat   Migraines    Polymyalgia (HCC)    Polymyalgia rheumatica (HCC)    Temporal arteritis (HCC)    Past Surgical History:  Procedure Laterality Date   BARTHOLIN GLAND CYST EXCISION     non ca   BREAST BIOPSY Right    CARDIOVERSION N/A 11/05/2023   Procedure: CARDIOVERSION;  Surgeon: Sheena Pugh, DO;  Location: [facility];  Service: Cardiovascular;  Laterality: N/A;   CARDIOVERSION N/A 12/19/2023   Procedure: CARDIOVERSION;  Surgeon: Santo Stanly LABOR, MD;  Location: [facility];  Service: Cardiovascular;  Laterality: N/A;   EYE MUSCLE SURGERY     as a child 69 yrs old   KNEE SURGERY  2004   rt knee   LAPAROSCOPIC SPLENECTOMY N/A 06/18/2018   Procedure: LAPAROSCOPIC SPLENECTOMY ERAS  PATHWAY;  Surgeon: Mikell Katz, MD;  Location: [facility];  Service: General;  Laterality: N/A;   toncil     TONSILLECTOMY     as a child   TRANSESOPHAGEAL ECHOCARDIOGRAM (CATH LAB) N/A 11/05/2023   Procedure: TRANSESOPHAGEAL ECHOCARDIOGRAM;  Surgeon: Sheena Pugh, DO;  Location: [facility];  Service: Cardiovascular;  Laterality: N/A;   TRANSESOPHAGEAL ECHOCARDIOGRAM (CATH LAB) N/A 12/19/2023   Procedure: TRANSESOPHAGEAL ECHOCARDIOGRAM;  Surgeon: Santo Stanly LABOR, MD;  Location: [facility];  Service: Cardiovascular;  Laterality: N/A;   Patient Active Problem List   Diagnosis Date Noted   Acute on chronic congestive heart failure (HCC) 12/24/2023   Acute on chronic HFrEF (heart failure with reduced ejection fraction) (HCC) 12/21/2023   Atrial fibrillation with rapid ventricular response (HCC) 12/21/2023   Atrial fibrillation, rapid (HCC) 12/21/2023   Pericardial effusion 11/07/2023   Atrial fibrillation with RVR (HCC) 11/05/2023   Borderline abnormal TFTs 11/05/2023   Macrocytosis 11/05/2023   Atrial fibrillation (HCC) 11/04/2023   PMR (polymyalgia rheumatica) (HCC) 11/04/2023   Primary localized osteoarthrosis of multiple sites 06/15/2022   Diastolic dysfunction without heart failure 03/05/2018   DOE (  dyspnea on exertion) 03/05/2018   Preop cardiovascular exam 03/05/2018   Mild reactive airways disease 08/04/2017   Volume overload 08/03/2017   Hypokalemia 08/03/2017   Essential hypertension 08/03/2017   Hyperlipidemia 08/03/2017   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   Hypersensitivity reaction 11/20/2016   Drusen (degenerative) of retina, bilateral 05/22/2016   Nuclear cataract 05/22/2016   Retinal hemorrhage of left eye 05/22/2016   Temporal arteritis (HCC) 05/22/2016   Hemolytic anemia (HCC) 03/19/2016   Breast mass, right 07/01/2012    ONSET DATE: 11/04/23  REFERRING DIAG: I48.91 (ICD-10-CM) - Atrial fibrillation with RVR (HCC)  THERAPY  DIAG:  Other abnormalities of gait and mobility  Unsteadiness on feet  Muscle weakness (generalized)  Rationale for Evaluation and Treatment: Rehabilitation  SUBJECTIVE:                                                                                                                                                                                             SUBJECTIVE STATEMENT: Did a lot in the session last visit.  Cardiac cath is tomorrow.  They may decide that I need a stent; they may not.  It is mostly diagnostic. Pt accompanied by: self PERTINENT HISTORY: 12/31/2023:  Pt underwent attempt at cardioversion for a-fib on 12/19/23, which did not last >24 hrs; was hospitalized after ED visit for SOB, CHF (*New orders received for PT resume and treat).  Anxiety, asthma, CHF, depression, gouty arthropathy, anemia, HLD, HTN, migraines, polymyalgia rheumatica, temporal arteritis, R knee surgery   PAIN:  Are you having pain? Yes: NPRS scale: very slight in my knee Pain location: R knee Pain description: sore Aggravating factors: previous fall Relieving factors: knee brace  PRECAUTIONS: Fall; per Cardiac NP's recommendation: goal resting HR is <110bpm. goal heart rate is <135 bpm with exertion.  12/31/2023:  Have HR monitor (will wear for 14 days).  Monitor BP  WEIGHT BEARING RESTRICTIONS: No  FALLS: Has patient fallen in last 6 months? Yes. Number of falls 1  LIVING ENVIRONMENT: Lives with: lives with their spouse who has stage 4 stomach CA (pt reports that she is having to physically assist him some) Lives in: Other townhouse  Stairs: 2 stories, sleeps on 1st floor in recliner  Has following equipment at home: Walker - 2 wheeled, cane  PLOF: Independent; currently getting assistance with meals   PATIENT GOALS: improve fatigue   OBJECTIVE:    TODAY'S TREATMENT: 03/03/2024 Activity Comments  Vitals:  96%, HR 84 bpm 130/82   6 MWT:  900 ft using cane Compared to 838 ft with RW  last check  Post test vitals:  93%  O2 sats, HR 100 bpm 152/90   FGA 11/30 Pt reports PTSD from previous fall and therefore difficulty with eyes closed           PATIENT EDUCATION: Education details: discussed POC, progress towards goals and plans for PT to further address high level balance; discussed potential at some for 30 day hold or discharge with upcoming cardiology procedures Person educated: Patient Education method: Explanation Education comprehension: verbalized understanding    HOME EXERCISE PROGRAM Last updated: 02/14/24 Access Code: [email or web address] URL: [email or web address] Date: 02/14/2024 Prepared by: [facility] - Outpatient  Rehab - [facility]  Program Notes you can get a pair of adjustable ankle weights ranging from 1-5 lbs from [email or web address]   Exercises - Sit to Stand with Arms Crossed  - 1 x daily - 5 x weekly - 2 sets - 10 reps - Standing Hamstring Curl with Chair Support  - 1 x daily - 7 x weekly - 3 sets - 10-15 reps - Standing March with Counter Support  - 1 x daily - 7 x weekly - 2-3 sets - 1 min hold - Side Stepping with Counter Support  - 1 x daily - 5 x weekly - 2 sets - 1 min hold - Forward Step Up  - 1 x daily - 5 x weekly - 2-3 sets - 30 sec hold      ______________________________________________ Note: Objective measures were completed at Evaluation unless otherwise noted.  Vitals: 96% spO2, 97 bpm (102 bpm taken manually),  115/85 mmHg  DIAGNOSTIC FINDINGS: 11/05/23 echo TEE: Left Atrium: Left atrial size was severely dilated. A left atrial/left  atrial appendage thrombus was detected.   COGNITION: Overall cognitive status: Within functional limits for tasks assessed   SENSATION: WFL  POSTURE: rounded shoulders and forward head  LOWER EXTREMITY MMT:   MMT (in sitting) Right Eval Left Eval  Hip flexion 4+ 4+  Hip extension    Hip abduction 4+ 4+  Hip adduction 4+ 4+  Hip internal rotation    Hip external rotation     Knee flexion 4+ 4+  Knee extension 4+ 4  Ankle dorsiflexion 4+ 4+  Ankle plantarflexion 4 4  Ankle inversion    Ankle eversion    (Blank rows = not tested)  GAIT: Gait pattern: slow gait speed, pushing walker too far forward Assistive device utilized: Walker - 2 wheeled Level of assistance: SBA  FUNCTIONAL TESTS:  5xSTS: 26.23 sec pushing off knees and audible R knee crepitus  Pre: 97% spO2, 89bpm  Post: 95% spO2, 102bpm   22M walk test: 17.53 sec with RW (1.87 ft/sec)                                                                                                                 __________________________________________________________________________________ GOALS: Goals reviewed with patient? Yes  SHORT TERM GOALS: Target date: 12/05/2023  Patient to be independent with initial HEP. Baseline: HEP initiated Goal status: MET  LONG TERM GOALS: Target date: 03/12/2024  >04/10/2024  Patient to be  independent with advanced HEP. Baseline: Initiated and may need to progress to include high level balance, 03/03/2024  Goal status: IN PROGRESS, 03/03/2024  Patient to demonstrate gait speed of at least 2.0 ft/sec in order to improve access to community.  Baseline: 1.87 ft/sec 12/31/2023; 12.09 sec with RW (2.7 ft/sec) 01/30/24 Goal status: MET  01/30/24  Patient to report tolerance for grocery trip without fatigue limiting.    Baseline: unable; She has been able to tolerate short trips lasting ~10 minutes with her walker but recently went to [employer] at tried a long trip and it felt like it was too much. 01/30/24; able to get through a few trips and fatigues by the time she gets home 03/03/2024 Goal status: MET,03/03/2024  6 min walk test to improve to >600 ft with device as needed Baseline: 437 ft>710 ft in 6 MWT 12/17/23 Goal status: MET 12/17/2023  Patient to demonstrate 5xSTS test in <15 sec in order to decrease risk of falls.  Baseline: 26.23 sec pushing off knees and audible R  knee crepitus; 45.41 seconds with arms crossed at chest 5/13; 15.9 sec without UEs 01/30/24; 18.38 sec (end of session 03/03/2024) Goal status: IN PROGRESS, 03/03/2024  6 min walk test to improve to at least 1,000 ft with device as needed Baseline: 838 feet with RW 01/30/24; 900 ft with [facility] 03/03/2024 Goal status: PARTIALLY ME/IN PROGRESS, 03/03/2024  FGA to improve to a least 16/30 for decreased fall risk. 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  Assessed goals and pt has progressed towards 6 MWT goal with cane today versus RW last check, but not to goal level.  She has difficulty with FTSTS today due to fatigue after 6 MWT.  With assessing FGA, pt score 11/30 (no device), indicating increased fall risk.  She has difficulty with head motions, gait speed/change of speed, and backwards walking; she also has difficulty with tandem gait and EC (reports she has PTSD from a previous fall and does not want to close her eyes).  She has made significant progress from use of RW for all mobility initially, to mostly cane and sometimes no device, but with her fall risk per FGA, she would benefit from continued skilled PT to help reduce this fall risk.     OBJECTIVE IMPAIRMENTS: Abnormal gait, cardiopulmonary status limiting activity, decreased activity tolerance, decreased balance, difficulty walking, decreased strength, and pain.   PLAN:  PT FREQUENCY: 1x/wk   PT DURATION: 4 weeks  PLANNED INTERVENTIONS: 97164- PT Re-evaluation, 97110-Therapeutic exercises, 97530- Therapeutic activity, 97112- Neuromuscular re-education, 97535- Self Care, [id number]- Manual therapy, [phone]- Gait training, [phone]- Canalith repositioning, [phone]- Aquatic Therapy, Patient/Family education, Balance training, Stair training, Taping,  Dry Needling, Vestibular training, DME instructions, Cryotherapy, and Moist heat  PLAN FOR NEXT SESSION: Recert completed this visit and needs to have 4 total (1x/wk) visits scheduled through 04/10/2024; update HEP to include high level balance (ask about how cardiac cath went)-anticipate planning for discharge at the end of this new POC-  Greig Anon, PT 03/03/24 5:20 PM Phone: [phone] Fax: [phone]  [facility] at [facility] [address], [address] [place], [place] [address] Phone # [phone] Fax # [phone]

## 2024-03-03 NOTE — Telephone Encounter (Signed)
 Cardiac Catheterization scheduled at [facility] for: Wednesday March 04, 2024 8:30 AM Arrival time [facility] Main Entrance A at: 6:30 AM  Nothing to eat after midnight prior to procedure, clear liquids until 5 AM day of procedure.  Medication instructions: -Hold:  Eliquis -none 03/02/24 until post procedure  Lasix /KCl-AM of procedure -Other usual morning medications can be taken with sips of water including aspirin  81 mg.  Plan to go home the same day, you will only stay overnight if medically necessary.  You must have responsible adult to drive you home.  Someone must be with you the first 24 hours after you arrive home.  Reviewed procedure instructions with patient.

## 2024-03-04 ENCOUNTER — Other Ambulatory Visit: Payer: Self-pay

## 2024-03-04 ENCOUNTER — Encounter (HOSPITAL_COMMUNITY)
Admission: RE | Disposition: A | Discharge status: Home / Self Care | Payer: Self-pay | Source: Home / Self Care | Attending: Cardiology

## 2024-03-04 ENCOUNTER — Encounter (HOSPITAL_COMMUNITY): Payer: Self-pay | Admitting: Cardiology

## 2024-03-04 ENCOUNTER — Ambulatory Visit (HOSPITAL_COMMUNITY)
Admission: RE | Admit: 2024-03-04 | Discharge: 2024-03-04 | Disposition: A | Discharge status: Home / Self Care | Attending: Cardiology | Admitting: Cardiology

## 2024-03-04 DIAGNOSIS — I429 Cardiomyopathy, unspecified: Secondary | ICD-10-CM

## 2024-03-04 DIAGNOSIS — I351 Nonrheumatic aortic (valve) insufficiency: Secondary | ICD-10-CM

## 2024-03-04 DIAGNOSIS — I5020 Unspecified systolic (congestive) heart failure: Secondary | ICD-10-CM | POA: Insufficient documentation

## 2024-03-04 DIAGNOSIS — I350 Nonrheumatic aortic (valve) stenosis: Secondary | ICD-10-CM | POA: Diagnosis not present

## 2024-03-04 DIAGNOSIS — Z7901 Long term (current) use of anticoagulants: Secondary | ICD-10-CM | POA: Insufficient documentation

## 2024-03-04 DIAGNOSIS — E785 Hyperlipidemia, unspecified: Secondary | ICD-10-CM | POA: Diagnosis not present

## 2024-03-04 DIAGNOSIS — R7989 Other specified abnormal findings of blood chemistry: Secondary | ICD-10-CM | POA: Diagnosis not present

## 2024-03-04 DIAGNOSIS — Z79899 Other long term (current) drug therapy: Secondary | ICD-10-CM | POA: Diagnosis not present

## 2024-03-04 DIAGNOSIS — I340 Nonrheumatic mitral (valve) insufficiency: Secondary | ICD-10-CM | POA: Insufficient documentation

## 2024-03-04 DIAGNOSIS — I4819 Other persistent atrial fibrillation: Secondary | ICD-10-CM | POA: Insufficient documentation

## 2024-03-04 DIAGNOSIS — I110 Hypertensive heart disease with heart failure: Secondary | ICD-10-CM | POA: Diagnosis not present

## 2024-03-04 DIAGNOSIS — I5032 Chronic diastolic (congestive) heart failure: Secondary | ICD-10-CM

## 2024-03-04 HISTORY — PX: RIGHT/LEFT HEART CATH AND CORONARY ANGIOGRAPHY: CATH118266

## 2024-03-04 LAB — POCT I-STAT EG7
Acid-Base Excess: 3 mmol/L — ABNORMAL HIGH (ref 0.0–2.0)
Acid-Base Excess: 3 mmol/L — ABNORMAL HIGH (ref 0.0–2.0)
Bicarbonate: 29.1 mmol/L — ABNORMAL HIGH (ref 20.0–28.0)
Bicarbonate: 29.3 mmol/L — ABNORMAL HIGH (ref 20.0–28.0)
Calcium, Ion: 1.13 mmol/L — ABNORMAL LOW (ref 1.15–1.40)
Calcium, Ion: 1.16 mmol/L (ref 1.15–1.40)
HCT: 40 % (ref 36.0–46.0)
HCT: 41 % (ref 36.0–46.0)
Hemoglobin: 13.6 g/dL (ref 12.0–15.0)
Hemoglobin: 13.9 g/dL (ref 12.0–15.0)
O2 Saturation: 61 %
O2 Saturation: 63 %
Potassium: 3.1 mmol/L — ABNORMAL LOW (ref 3.5–5.1)
Potassium: 3.2 mmol/L — ABNORMAL LOW (ref 3.5–5.1)
Sodium: 139 mmol/L (ref 135–145)
Sodium: 141 mmol/L (ref 135–145)
TCO2: 31 mmol/L (ref 22–32)
TCO2: 31 mmol/L (ref 22–32)
pCO2, Ven: 47.4 mmHg (ref 44–60)
pCO2, Ven: 49.7 mmHg (ref 44–60)
pH, Ven: 7.379 (ref 7.25–7.43)
pH, Ven: 7.397 (ref 7.25–7.43)
pO2, Ven: 33 mmHg (ref 32–45)
pO2, Ven: 33 mmHg (ref 32–45)

## 2024-03-04 LAB — POCT I-STAT 7, (LYTES, BLD GAS, ICA,H+H)
Acid-Base Excess: 3 mmol/L — ABNORMAL HIGH (ref 0.0–2.0)
Bicarbonate: 26.8 mmol/L (ref 20.0–28.0)
Calcium, Ion: 1.17 mmol/L (ref 1.15–1.40)
HCT: 40 % (ref 36.0–46.0)
Hemoglobin: 13.6 g/dL (ref 12.0–15.0)
O2 Saturation: 95 %
Potassium: 3.4 mmol/L — ABNORMAL LOW (ref 3.5–5.1)
Sodium: 142 mmol/L (ref 135–145)
TCO2: 28 mmol/L (ref 22–32)
pCO2 arterial: 38.7 mmHg (ref 32–48)
pH, Arterial: 7.448 (ref 7.35–7.45)
pO2, Arterial: 71 mmHg — ABNORMAL LOW (ref 83–108)

## 2024-03-04 SURGERY — RIGHT/LEFT HEART CATH AND CORONARY ANGIOGRAPHY
Anesthesia: LOCAL

## 2024-03-04 MED ORDER — MIDAZOLAM HCL 2 MG/2ML IJ SOLN
INTRAMUSCULAR | Status: DC | PRN
  Administered 2024-03-04: 1 mg via INTRAVENOUS

## 2024-03-04 MED ORDER — HYDRALAZINE HCL 20 MG/ML IJ SOLN: 10.0000 mg | INTRAMUSCULAR | Status: DC | PRN

## 2024-03-04 MED ORDER — VERAPAMIL HCL 2.5 MG/ML IV SOLN
INTRAVENOUS | Status: AC
  Filled 2024-03-04: qty 2

## 2024-03-04 MED ORDER — LIDOCAINE HCL (PF) 1 % IJ SOLN
INTRAMUSCULAR | Status: DC | PRN
Start: 2024-03-04 — End: 2024-03-04
  Administered 2024-03-04: 2 mL

## 2024-03-04 MED ORDER — IOHEXOL 350 MG/ML SOLN
INTRAVENOUS | Status: DC | PRN
  Administered 2024-03-04: 36 mL via INTRA_ARTERIAL

## 2024-03-04 MED ORDER — FENTANYL CITRATE (PF) 100 MCG/2ML IJ SOLN
INTRAMUSCULAR | Status: AC
  Filled 2024-03-04: qty 2

## 2024-03-04 MED ORDER — HEPARIN SODIUM (PORCINE) 1000 UNIT/ML IJ SOLN
INTRAMUSCULAR | Status: DC | PRN
  Administered 2024-03-04: 4000 [IU] via INTRAVENOUS

## 2024-03-04 MED ORDER — SODIUM CHLORIDE 0.9 % WEIGHT BASED INFUSION: 3.0000 mL/kg/h | INTRAVENOUS | Status: AC

## 2024-03-04 MED ORDER — LABETALOL HCL 5 MG/ML IV SOLN: 10.0000 mg | INTRAVENOUS | Status: DC | PRN

## 2024-03-04 MED ORDER — HEPARIN (PORCINE) IN NACL 1000-0.9 UT/500ML-% IV SOLN
INTRAVENOUS | Status: DC | PRN
Start: 2024-03-04 — End: 2024-03-04
  Administered 2024-03-04: 1000 mL via INTRA_ARTERIAL

## 2024-03-04 MED ORDER — ONDANSETRON HCL 4 MG/2ML IJ SOLN: 4.0000 mg | Freq: Four times a day (QID) | INTRAMUSCULAR | Status: DC | PRN

## 2024-03-04 MED ORDER — SODIUM CHLORIDE 0.9 % WEIGHT BASED INFUSION: 1.0000 mL/kg/h | INTRAVENOUS | Status: DC

## 2024-03-04 MED ORDER — HEPARIN SODIUM (PORCINE) 1000 UNIT/ML IJ SOLN
INTRAMUSCULAR | Status: AC
  Filled 2024-03-04: qty 10

## 2024-03-04 MED ORDER — SODIUM CHLORIDE 0.9% FLUSH: 3.0000 mL | INTRAVENOUS | Status: DC | PRN

## 2024-03-04 MED ORDER — ASPIRIN 81 MG PO CHEW: 81.0000 mg | CHEWABLE_TABLET | ORAL | Status: DC

## 2024-03-04 MED ORDER — SODIUM CHLORIDE 0.9 % IV SOLN: 250.0000 mL | INTRAVENOUS | Status: DC | PRN

## 2024-03-04 MED ORDER — LIDOCAINE HCL (PF) 1 % IJ SOLN
INTRAMUSCULAR | Status: AC
  Filled 2024-03-04: qty 30

## 2024-03-04 MED ORDER — FENTANYL CITRATE (PF) 100 MCG/2ML IJ SOLN
INTRAMUSCULAR | Status: DC | PRN
  Administered 2024-03-04: 25 ug via INTRAVENOUS

## 2024-03-04 MED ORDER — VERAPAMIL HCL 2.5 MG/ML IV SOLN
INTRAVENOUS | Status: DC | PRN
  Administered 2024-03-04: 10 mL via INTRA_ARTERIAL

## 2024-03-04 MED ORDER — SODIUM CHLORIDE 0.9% FLUSH: 3.0000 mL | Freq: Two times a day (BID) | INTRAVENOUS | Status: DC

## 2024-03-04 MED ORDER — MIDAZOLAM HCL 2 MG/2ML IJ SOLN
INTRAMUSCULAR | Status: AC
  Filled 2024-03-04: qty 2

## 2024-03-04 MED ORDER — ACETAMINOPHEN 325 MG PO TABS: 650.0000 mg | ORAL_TABLET | ORAL | Status: DC | PRN

## 2024-03-04 SURGICAL SUPPLY — 12 items
CATH BALLN WEDGE 5F 110CM (CATHETERS) IMPLANT
CATH INFINITI AMBI 5FR TG (CATHETERS) IMPLANT
DEVICE RAD COMP TR BAND LRG (VASCULAR PRODUCTS) IMPLANT
GLIDESHEATH SLEND SS 6F .021 (SHEATH) IMPLANT
GUIDEWIRE INQWIRE 1.5J.035X260 (WIRE) IMPLANT
KIT SYRINGE INJ CVI SPIKEX1 (MISCELLANEOUS) IMPLANT
PACK CARDIAC CATHETERIZATION (CUSTOM PROCEDURE TRAY) ×1 IMPLANT
SET ATX-X65L (MISCELLANEOUS) IMPLANT
SHEATH GLIDE SLENDER 4/5FR (SHEATH) IMPLANT
SHEATH PROBE COVER 6X72 (BAG) IMPLANT
STATION PROTECTION PRESSURIZED (MISCELLANEOUS) IMPLANT
WIRE EMERALD 3MM-J .025X260CM (WIRE) IMPLANT

## 2024-03-04 NOTE — Discharge Instructions (Signed)

## 2024-03-04 NOTE — Progress Notes (Signed)
 TR band removed at 1115, gauze dressing applied. Right radial level 0, clean, dry, and intact.

## 2024-03-04 NOTE — Interval H&P Note (Signed)
 History and Physical Interval Note:  03/04/2024 8:44 AM  Michelle Wilcox  has presented today for surgery, with the diagnosis of elevated troponin, heart failure, mitral valve insufficiency.  The various methods of treatment have been discussed with the patient and family. After consideration of risks, benefits and other options for treatment, the patient has consented to  Procedure(s): RIGHT/LEFT HEART CATH AND CORONARY ANGIOGRAPHY (N/A)  PERCUTANEOUS CORONARY INTERVENTION  as a surgical intervention.  The patient's history has been reviewed, patient examined, no change in status, stable for surgery.  I have reviewed the patient's chart and labs.  Questions were answered to the patient's satisfaction.    Cath Lab Visit (complete for each Cath Lab visit)  Clinical Evaluation Leading to the Procedure:   ACS: No.  Non-ACS:    Anginal Classification: No Symptoms  Anti-ischemic medical therapy: Maximal Therapy (2 or more classes of medications)  Non-Invasive Test Results: Equivocal test results  Prior CABG: No previous CABG    Alm Clay

## 2024-03-06 NOTE — Therapy (Signed)
 OUTPATIENT PHYSICAL THERAPY NEURO TREATMENT NOTE   Patient Name: Michelle Wilcox MRN: [id number] DOB:Aug 01, 1946, 78 y.o., female Today's Date: 03/09/2024  PCP: Aisha Harvey, MD REFERRING PROVIDER: Christobal Guadalajara, MD        END OF SESSION:  PT End of Session - 03/09/24 1509     Visit Number 28    Number of Visits 31    Date for PT Re-Evaluation 04/10/24    Authorization Type [employer] Medicare    Progress Note Due on Visit --   PN done at visit 27   PT Start Time 1449    PT Stop Time 1529    PT Time Calculation (min) 40 min    Equipment Utilized During Treatment Gait belt    Activity Tolerance Patient tolerated treatment well;Patient limited by fatigue    Behavior During Therapy WFL for tasks assessed/performed                     Past Medical History:  Diagnosis Date   Anxiety    hx of   Asthma    at times   CHF (congestive heart failure) (HCC)    Depression    hx of   Dyspnea    Giant cell arteritis (HCC)    Gouty arthropathy    Hemolytic anemia (HCC)    Hyperlipidemia    Hypertension    Irregular heart beat    extra beat   Migraines    Polymyalgia (HCC)    Polymyalgia rheumatica (HCC)    Temporal arteritis (HCC)    Past Surgical History:  Procedure Laterality Date   BARTHOLIN GLAND CYST EXCISION     non ca   BREAST BIOPSY Right    CARDIOVERSION N/A 11/05/2023   Procedure: CARDIOVERSION;  Surgeon: Sheena Pugh, DO;  Location: [facility];  Service: Cardiovascular;  Laterality: N/A;   CARDIOVERSION N/A 12/19/2023   Procedure: CARDIOVERSION;  Surgeon: Santo Stanly LABOR, MD;  Location: [facility];  Service: Cardiovascular;  Laterality: N/A;   EYE MUSCLE SURGERY     as a child 36 yrs old   KNEE SURGERY  2004   rt knee   LAPAROSCOPIC SPLENECTOMY N/A 06/18/2018   Procedure: LAPAROSCOPIC SPLENECTOMY ERAS PATHWAY;  Surgeon: Mikell Katz, MD;  Location: [facility];  Service: General;  Laterality: N/A;   RIGHT/LEFT HEART  CATH AND CORONARY ANGIOGRAPHY N/A 03/04/2024   Procedure: RIGHT/LEFT HEART CATH AND CORONARY ANGIOGRAPHY;  Surgeon: Anner Alm ORN, MD;  [facility];  Service: Cardiovascular;  Laterality: N/A;   toncil     TONSILLECTOMY     as a child   TRANSESOPHAGEAL ECHOCARDIOGRAM (CATH LAB) N/A 11/05/2023   Procedure: TRANSESOPHAGEAL ECHOCARDIOGRAM;  Surgeon: Sheena Pugh, DO;  Location: [facility];  Service: Cardiovascular;  Laterality: N/A;   TRANSESOPHAGEAL ECHOCARDIOGRAM (CATH LAB) N/A 12/19/2023   Procedure: TRANSESOPHAGEAL ECHOCARDIOGRAM;  Surgeon: Santo Stanly LABOR, MD;  Location: [facility];  Service: Cardiovascular;  Laterality: N/A;   Patient Active Problem List   Diagnosis Date Noted   Acute on chronic congestive heart failure (HCC) 12/24/2023   Acute on chronic HFrEF (heart failure with reduced ejection fraction) (HCC) 12/21/2023   Atrial fibrillation with rapid ventricular response (HCC) 12/21/2023   Atrial fibrillation, rapid (HCC) 12/21/2023   Pericardial effusion 11/07/2023   Atrial fibrillation with RVR (HCC) 11/05/2023   Borderline abnormal TFTs 11/05/2023   Macrocytosis 11/05/2023   Atrial fibrillation (HCC) 11/04/2023   PMR (polymyalgia rheumatica) (  HCC) 11/04/2023   Primary localized osteoarthrosis of multiple sites 06/15/2022   Diastolic dysfunction without heart failure 03/05/2018   DOE (dyspnea on exertion) 03/05/2018   Preop cardiovascular exam 03/05/2018   Mild reactive airways disease 08/04/2017   Volume overload 08/03/2017   Hypokalemia 08/03/2017   Essential hypertension 08/03/2017   Hyperlipidemia 08/03/2017   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   Hypersensitivity reaction 11/20/2016   Drusen (degenerative) of retina, bilateral 05/22/2016   Nuclear cataract 05/22/2016   Retinal hemorrhage of left eye 05/22/2016   Temporal arteritis (HCC) 05/22/2016   Hemolytic anemia (HCC) 03/19/2016   Breast mass, right 07/01/2012     ONSET DATE: 11/04/23  REFERRING DIAG: I48.91 (ICD-10-CM) - Atrial fibrillation with RVR (HCC)  THERAPY DIAG:  Other abnormalities of gait and mobility  Unsteadiness on feet  Muscle weakness (generalized)  Other symptoms and signs involving the musculoskeletal system  Difficulty in walking, not elsewhere classified  Rationale for Evaluation and Treatment: Rehabilitation  SUBJECTIVE:                                                                                                                                                                                             SUBJECTIVE STATEMENT: Heart cath went well- did not have to put in a stent. R wrist is still a little bruised from the procedure. Denies precautions s/p procedure however reports that she was told to avoid exercise for 4 days s/p procedure. Will still be getting an ablation but has not been scheduled yet. Reports feeling well. She reports feeling panicky when trying to walk with EC last session.  Pt accompanied by: self PERTINENT HISTORY: 12/31/2023:  Pt underwent attempt at cardioversion for a-fib on 12/19/23, which did not last >24 hrs; was hospitalized after ED visit for SOB, CHF (*New orders received for PT resume and treat).  Anxiety, asthma, CHF, depression, gouty arthropathy, anemia, HLD, HTN, migraines, polymyalgia rheumatica, temporal arteritis, R knee surgery   PAIN:  Are you having pain? Yes: NPRS scale: R wrist is a little sore Pain location: R wrist Pain description: sore Aggravating factors: heart cath Relieving factors: nothing  PRECAUTIONS: Fall; per Cardiac NP's recommendation: goal resting HR is <110bpm. goal heart rate is <135 bpm with exertion.  12/31/2023:  Have HR monitor (will wear for 14 days).  Monitor BP  WEIGHT BEARING RESTRICTIONS: No  FALLS: Has patient fallen in last 6 months? Yes. Number of falls 1  LIVING ENVIRONMENT: Lives with: lives with their spouse who has stage 4 stomach CA  (pt reports that she is having to physically assist him some) Lives in: Other townhouse  Stairs: 2 stories, sleeps on 1st floor in recliner  Has following equipment at home: Walker - 2 wheeled, cane  PLOF: Independent; currently getting assistance with meals   PATIENT GOALS: improve fatigue   OBJECTIVE:     TODAY'S TREATMENT: 03/09/24 Activity Comments  Vitals at start of session  117/48mmHg 92%, 81 bpm  treadmill walking x 6 min, then cool down x 1 min  B UE support; 1.3-2.0 mph 93% spO2, 107bpm Pt reports feeling very tired  Romberg EC 3x30 Pt very fearful initially  Performed in corner with chair in front for improved safety/confidence   Standing wide EC on foam 3x30  Pt very fearful initially  Performed in corner with chair in front for improved safety/confidence     PATIENT EDUCATION: Education details: discussed pt's procedure and remaining balance impairments, HEP update with cues for safety and discussed previous HEP- pt admits to not getting ankle weights and not performing HEP; discussed plan to DC with therapy at end of current POC d/t pt needing to implement therapy tools at home  Person educated: Patient Education method: Explanation, Demonstration, Tactile cues, Verbal cues, and Handouts Education comprehension: verbalized understanding, returned demonstration, verbal cues required, and tactile cues required    HOME EXERCISE PROGRAM Access Code: [email or web address] URL: [email or web address] Date: 03/09/2024 Prepared by: [facility] - [facility]  Program Notes you can get a pair of adjustable ankle weights ranging from 1-5 lbs from [email or web address]   Exercises - Sit to Stand with Arms Crossed  - 1 x daily - 5 x weekly - 2 sets - 10 reps - Standing Hamstring Curl with Chair Support  - 1 x daily - 7 x weekly - 3 sets - 10-15 reps - Standing March with Counter Support  - 1 x daily - 7 x weekly - 2-3 sets - 1 min hold - Side Stepping  with Counter Support  - 1 x daily - 5 x weekly - 2 sets - 1 min hold - Forward Step Up  - 1 x daily - 5 x weekly - 2-3 sets - 30 sec hold - Walking with Eyes Closed and Counter Support  - 1 x daily - 5 x weekly - 2 sets - 5 reps - Standing Near Stance in Corner with Eyes Closed  - 1 x daily - 5 x weekly - 3 sets - 30 sec hold    ______________________________________________ Note: Objective measures were completed at Evaluation unless otherwise noted.  Vitals: 96% spO2, 97 bpm (102 bpm taken manually),  115/85 mmHg  DIAGNOSTIC FINDINGS: 11/05/23 echo TEE: Left At[facility]: Left atrial size was severely dilated. A left atrial/left  atrial appendage thrombus was detected.   COGNITION: Overall cognitive status: Within functional limits for tasks assessed   SENSATION: WFL  POSTURE: rounded shoulders and forward head  LOWER EXTREMITY MMT:   MMT (in sitting) Right Eval Left Eval  Hip flexion 4+ 4+  Hip extension    Hip abduction 4+ 4+  Hip adduction 4+ 4+  Hip internal rotation    Hip external rotation    Knee flexion 4+ 4+  Knee extension 4+ 4  Ankle dorsiflexion 4+ 4+  Ankle plantarflexion 4 4  Ankle inversion    Ankle eversion    (Blank rows = not tested)  GAIT: Gait pattern: slow gait speed, pushing walker too far forward Assistive device utilized: Walker - 2 wheeled Level of assistance: SBA  FUNCTIONAL TESTS:  5xSTS: 26.23 sec pushing off knees and audible  R knee crepitus  Pre: 97% spO2, 89bpm  Post: 95% spO2, 102bpm   37M walk test: 17.53 sec with RW (1.87 ft/sec)                                                                                                                 __________________________________________________________________________________ GOALS: Goals reviewed with patient? Yes  SHORT TERM GOALS: Target date: 12/05/2023  Patient to be independent with initial HEP. Baseline: HEP initiated Goal status: MET  LONG TERM GOALS: Target date: 03/12/2024   >04/10/2024  Patient to be independent with advanced HEP. Baseline: Initiated and may need to progress to include high level balance, 03/03/2024  Goal status: IN PROGRESS, 03/03/2024  Patient to demonstrate gait speed of at least 2.0 ft/sec in order to improve access to community.  Baseline: 1.87 ft/sec 12/31/2023; 12.09 sec with RW (2.7 ft/sec) 01/30/24 Goal status: MET  01/30/24  Patient to report tolerance for grocery trip without fatigue limiting.    Baseline: unable; She has been able to tolerate short trips lasting ~10 minutes with her walker but recently went to [employer] at tried a long trip and it felt like it was too much. 01/30/24; able to get through a few trips and fatigues by the time she gets home 03/03/2024 Goal status: MET,03/03/2024  6 min walk test to improve to >600 ft with device as needed Baseline: 437 ft>710 ft in 6 MWT 12/17/23 Goal status: MET 12/17/2023  Patient to demonstrate 5xSTS test in <15 sec in order to decrease risk of falls.  Baseline: 26.23 sec pushing off knees and audible R knee crepitus; 45.41 seconds with arms crossed at chest 5/13; 15.9 sec without UEs 01/30/24; 18.38 sec (end of session 03/03/2024) Goal status: IN PROGRESS, 03/03/2024  6 min walk test to improve to at least 1,000 ft with device as needed Baseline: 838 feet with RW 01/30/24; 900 ft with [facility] 03/03/2024 Goal status: PARTIALLY ME/IN PROGRESS, 03/03/2024  FGA to improve to a least 16/30 for decreased fall risk. Baseline:  11/30 no cane 03/03/2024 Goal status:  INITIAL 03/03/2024     ASSESSMENT:  CLINICAL IMPRESSION: Patient arrived to session without new complaints s/p diagnostic heart cath on 7/16. Reports some remaining imbalance during last session's assessment. Patient completed warmup on treadmill to improve walking tolerance and challenge stability. She reported increased fatigue with this activity today and required rest breaks . Proceeded with EC balance activities to improve balance  confidence.   Updated into HEP- patient reported understanding of edu provided for safety. No complaints at end of session.     OBJECTIVE IMPAIRMENTS: Abnormal gait, cardiopulmonary status limiting activity, decreased activity tolerance, decreased balance, difficulty walking, decreased strength, and pain.   PLAN:  PT FREQUENCY: 1x/wk   PT DURATION: 4 weeks  PLANNED INTERVENTIONS: [id number]- PT Re-evaluation, 97110-Therapeutic exercises, 97530- Therapeutic activity, [id number]- Neuromuscular re-education, 97535- Self Care, [id number]- Manual therapy, [phone]- Gait training, [phone]- Canalith repositioning, [phone]- Aquatic Therapy, Patient/Family education, Balance training, Stair training, Taping, Dry Needling, Vestibular training, DME instructions,  Cryotherapy, and Moist heat  PLAN FOR NEXT SESSION: f/u on pt's compliance with HEP; update HEP to include high level balance -anticipate planning for discharge at the end of this new POC-    Louana Terrilyn Christians, PT, DPT 03/09/24 3:32 PM  [facility] [address] [place], [place] [address] Phone # [phone] Fax # [phone]

## 2024-03-09 ENCOUNTER — Ambulatory Visit: Admitting: Physical Therapy

## 2024-03-09 ENCOUNTER — Encounter: Payer: Self-pay | Admitting: Physical Therapy

## 2024-03-09 DIAGNOSIS — R2689 Other abnormalities of gait and mobility: Secondary | ICD-10-CM

## 2024-03-09 DIAGNOSIS — R262 Difficulty in walking, not elsewhere classified: Secondary | ICD-10-CM | POA: Diagnosis not present

## 2024-03-09 DIAGNOSIS — R29898 Other symptoms and signs involving the musculoskeletal system: Secondary | ICD-10-CM | POA: Diagnosis not present

## 2024-03-09 DIAGNOSIS — M6281 Muscle weakness (generalized): Secondary | ICD-10-CM

## 2024-03-09 DIAGNOSIS — R2681 Unsteadiness on feet: Secondary | ICD-10-CM

## 2024-03-11 DIAGNOSIS — R7989 Other specified abnormal findings of blood chemistry: Secondary | ICD-10-CM | POA: Diagnosis not present

## 2024-03-11 DIAGNOSIS — I5020 Unspecified systolic (congestive) heart failure: Secondary | ICD-10-CM | POA: Diagnosis not present

## 2024-03-11 DIAGNOSIS — I4819 Other persistent atrial fibrillation: Secondary | ICD-10-CM | POA: Diagnosis not present

## 2024-03-11 DIAGNOSIS — I340 Nonrheumatic mitral (valve) insufficiency: Secondary | ICD-10-CM | POA: Diagnosis not present

## 2024-03-11 LAB — LIPID PANEL
Chol/HDL Ratio: 2.7 ratio (ref 0.0–4.4)
Cholesterol, Total: 149 mg/dL (ref 100–199)
HDL: 55 mg/dL (ref >39)
LDL Chol Calc (NIH): 68 mg/dL (ref 0–99)
Triglycerides: 154 mg/dL — ABNORMAL HIGH (ref 0–149)
VLDL Cholesterol Cal: 26 mg/dL (ref 5–40)

## 2024-03-15 DIAGNOSIS — I4891 Unspecified atrial fibrillation: Secondary | ICD-10-CM

## 2024-03-16 ENCOUNTER — Ambulatory Visit

## 2024-03-16 DIAGNOSIS — M6281 Muscle weakness (generalized): Secondary | ICD-10-CM | POA: Diagnosis not present

## 2024-03-16 DIAGNOSIS — R2689 Other abnormalities of gait and mobility: Secondary | ICD-10-CM

## 2024-03-16 DIAGNOSIS — R2681 Unsteadiness on feet: Secondary | ICD-10-CM | POA: Diagnosis not present

## 2024-03-16 DIAGNOSIS — R262 Difficulty in walking, not elsewhere classified: Secondary | ICD-10-CM

## 2024-03-16 DIAGNOSIS — R29898 Other symptoms and signs involving the musculoskeletal system: Secondary | ICD-10-CM | POA: Diagnosis not present

## 2024-03-16 NOTE — Therapy (Signed)
 OUTPATIENT PHYSICAL THERAPY NEURO TREATMENT NOTE   Patient Name: Michelle Wilcox MRN: [id number] DOB:01-Feb-1946, 78 y.o., female Today's Date: 03/16/2024  PCP: Aisha Harvey, MD REFERRING PROVIDER: Christobal Guadalajara, MD        END OF SESSION:  PT End of Session - 03/16/24 1107     Visit Number 29    Number of Visits 31    Date for PT Re-Evaluation 04/10/24    Authorization Type [employer] Medicare    Progress Note Due on Visit --   PN done at visit 27   PT Start Time 1100    PT Stop Time 1145    PT Time Calculation (min) 45 min    Equipment Utilized During Treatment Gait belt    Activity Tolerance Patient tolerated treatment well;Patient limited by fatigue    Behavior During Therapy WFL for tasks assessed/performed                     Past Medical History:  Diagnosis Date   Anxiety    hx of   Asthma    at times   CHF (congestive heart failure) (HCC)    Depression    hx of   Dyspnea    Giant cell arteritis (HCC)    Gouty arthropathy    Hemolytic anemia (HCC)    Hyperlipidemia    Hypertension    Irregular heart beat    extra beat   Migraines    Polymyalgia (HCC)    Polymyalgia rheumatica (HCC)    Temporal arteritis (HCC)    Past Surgical History:  Procedure Laterality Date   BARTHOLIN GLAND CYST EXCISION     non ca   BREAST BIOPSY Right    CARDIOVERSION N/A 11/05/2023   Procedure: CARDIOVERSION;  Surgeon: Sheena Pugh, DO;  Location: [facility];  Service: Cardiovascular;  Laterality: N/A;   CARDIOVERSION N/A 12/19/2023   Procedure: CARDIOVERSION;  Surgeon: Santo Stanly LABOR, MD;  Location: [facility];  Service: Cardiovascular;  Laterality: N/A;   EYE MUSCLE SURGERY     as a child 80 yrs old   KNEE SURGERY  2004   rt knee   LAPAROSCOPIC SPLENECTOMY N/A 06/18/2018   Procedure: LAPAROSCOPIC SPLENECTOMY ERAS PATHWAY;  Surgeon: Mikell Katz, MD;  Location: [facility];  Service: General;  Laterality: N/A;   RIGHT/LEFT HEART  CATH AND CORONARY ANGIOGRAPHY N/A 03/04/2024   Procedure: RIGHT/LEFT HEART CATH AND CORONARY ANGIOGRAPHY;  Surgeon: Anner Alm ORN, MD;  Location: [facility] CV [facility];  Service: Cardiovascular;  Laterality: N/A;   toncil     TONSILLECTOMY     as a child   TRANSESOPHAGEAL ECHOCARDIOGRAM (CATH LAB) N/A 11/05/2023   Procedure: TRANSESOPHAGEAL ECHOCARDIOGRAM;  Surgeon: Sheena Pugh, DO;  Location: [facility];  Service: Cardiovascular;  Laterality: N/A;   TRANSESOPHAGEAL ECHOCARDIOGRAM (CATH LAB) N/A 12/19/2023   Procedure: TRANSESOPHAGEAL ECHOCARDIOGRAM;  Surgeon: Santo Stanly LABOR, MD;  Location: [facility];  Service: Cardiovascular;  Laterality: N/A;   Patient Active Problem List   Diagnosis Date Noted   Acute on chronic congestive heart failure (HCC) 12/24/2023   Acute on chronic HFrEF (heart failure with reduced ejection fraction) (HCC) 12/21/2023   Atrial fibrillation with rapid ventricular response (HCC) 12/21/2023   Atrial fibrillation, rapid (HCC) 12/21/2023   Pericardial effusion 11/07/2023   Atrial fibrillation with RVR (HCC) 11/05/2023   Borderline abnormal TFTs 11/05/2023   Macrocytosis 11/05/2023   Atrial fibrillation (HCC) 11/04/2023   PMR (polymyalgia rheumatica) (  HCC) 11/04/2023   Primary localized osteoarthrosis of multiple sites 06/15/2022   Diastolic dysfunction without heart failure 03/05/2018   DOE (dyspnea on exertion) 03/05/2018   Preop cardiovascular exam 03/05/2018   Mild reactive airways disease 08/04/2017   Volume overload 08/03/2017   Hypokalemia 08/03/2017   Essential hypertension 08/03/2017   Hyperlipidemia 08/03/2017   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   Hypersensitivity reaction 11/20/2016   Drusen (degenerative) of retina, bilateral 05/22/2016   Nuclear cataract 05/22/2016   Retinal hemorrhage of left eye 05/22/2016   Temporal arteritis (HCC) 05/22/2016   Hemolytic anemia (HCC) 03/19/2016   Breast mass, right 07/01/2012     ONSET DATE: 11/04/23  REFERRING DIAG: I48.91 (ICD-10-CM) - Atrial fibrillation with RVR (HCC)  THERAPY DIAG:  Other abnormalities of gait and mobility  Unsteadiness on feet  Muscle weakness (generalized)  Other symptoms and signs involving the musculoskeletal system  Difficulty in walking, not elsewhere classified  Rationale for Evaluation and Treatment: Rehabilitation  SUBJECTIVE:                                                                                                                                                                                             SUBJECTIVE STATEMENT: Doing ok, will be returning to work on Saturday, anticipate 32 hr/wk Pt accompanied by: self PERTINENT HISTORY: 12/31/2023:  Pt underwent attempt at cardioversion for a-fib on 12/19/23, which did not last >24 hrs; was hospitalized after ED visit for SOB, CHF (*New orders received for PT resume and treat).  Anxiety, asthma, CHF, depression, gouty arthropathy, anemia, HLD, HTN, migraines, polymyalgia rheumatica, temporal arteritis, R knee surgery   PAIN:  Are you having pain? Yes: NPRS scale: R wrist is a little sore Pain location: R wrist Pain description: sore Aggravating factors: heart cath Relieving factors: nothing  PRECAUTIONS: Fall; per Cardiac NP's recommendation: goal resting HR is <110bpm. goal heart rate is <135 bpm with exertion.  12/31/2023:  Have HR monitor (will wear for 14 days).  Monitor BP  WEIGHT BEARING RESTRICTIONS: No  FALLS: Has patient fallen in last 6 months? Yes. Number of falls 1  LIVING ENVIRONMENT: Lives with: lives with their spouse who has stage 4 stomach CA (pt reports that she is having to physically assist him some) Lives in: Other townhouse  Stairs: 2 stories, sleeps on 1st floor in recliner  Has following equipment at home: Walker - 2 wheeled, cane  PLOF: Independent; currently getting assistance with meals   PATIENT GOALS: improve fatigue    OBJECTIVE:    TODAY'S TREATMENT: 03/16/24 Activity Comments  Vitals: 96%, 75 bpm, 137/77 mmHg   Treadmill training  intervalsx 8 min - 2 min warm-up 2.2 mph -30 sec 2.4 mph/2.2 recovery . RPE 6/10 -CGA throughout and therapist controlling speed as she requires BUE support on rails. 0.35 miles distance  Vitals: 151/72 mmHg, 91 bpm, 96%   Obstacle course -high step over 6 hurdles w/ addition of airex pad and 6 step for step height and single limb support -4 square step CGA  LE PRE 3x15 reps, 3# -LAQ -hip add iso -standing march        TODAY'S TREATMENT: 03/09/24 Activity Comments  Vitals at start of session  117/61mmHg 92%, 81 bpm  treadmill walking x 6 min, then cool down x 1 min  B UE support; 1.3-2.0 mph 93% spO2, 107bpm Pt reports feeling very tired  Romberg EC 3x30 Pt very fearful initially  Performed in corner with chair in front for improved safety/confidence   Standing wide EC on foam 3x30  Pt very fearful initially  Performed in corner with chair in front for improved safety/confidence     PATIENT EDUCATION: Education details: discussed pt's procedure and remaining balance impairments, HEP update with cues for safety and discussed previous HEP- pt admits to not getting ankle weights and not performing HEP; discussed plan to DC with therapy at end of current POC d/t pt needing to implement therapy tools at home  Person educated: Patient Education method: Explanation, Demonstration, Tactile cues, Verbal cues, and Handouts Education comprehension: verbalized understanding, returned demonstration, verbal cues required, and tactile cues required    HOME EXERCISE PROGRAM Access Code: [email or web address] URL: [email or web address] Date: 03/09/2024 Prepared by: [facility] - Outpatient  Rehab - [facility]  Program Notes you can get a pair of adjustable ankle weights ranging from 1-5 lbs from [email or web address]   Exercises - Sit to Stand with Arms Crossed  - 1 x  daily - 5 x weekly - 2 sets - 10 reps - Standing Hamstring Curl with Chair Support  - 1 x daily - 7 x weekly - 3 sets - 10-15 reps - Standing March with Counter Support  - 1 x daily - 7 x weekly - 2-3 sets - 1 min hold - Side Stepping with Counter Support  - 1 x daily - 5 x weekly - 2 sets - 1 min hold - Forward Step Up  - 1 x daily - 5 x weekly - 2-3 sets - 30 sec hold - Walking with Eyes Closed and Counter Support  - 1 x daily - 5 x weekly - 2 sets - 5 reps - Standing Near Stance in Corner with Eyes Closed  - 1 x daily - 5 x weekly - 3 sets - 30 sec hold    ______________________________________________ Note: Objective measures were completed at Evaluation unless otherwise noted.  Vitals: 96% spO2, 97 bpm (102 bpm taken manually),  115/85 mmHg  DIAGNOSTIC FINDINGS: 11/05/23 echo TEE: Left Atrium: Left atrial size was severely dilated. A left atrial/left  atrial appendage thrombus was detected.   COGNITION: Overall cognitive status: Within functional limits for tasks assessed   SENSATION: WFL  POSTURE: rounded shoulders and forward head  LOWER EXTREMITY MMT:   MMT (in sitting) Right Eval Left Eval  Hip flexion 4+ 4+  Hip extension    Hip abduction 4+ 4+  Hip adduction 4+ 4+  Hip internal rotation    Hip external rotation    Knee flexion 4+ 4+  Knee extension 4+ 4  Ankle dorsiflexion 4+ 4+  Ankle plantarflexion 4 4  Ankle inversion  Ankle eversion    (Blank rows = not tested)  GAIT: Gait pattern: slow gait speed, pushing walker too far forward Assistive device utilized: Walker - 2 wheeled Level of assistance: SBA  FUNCTIONAL TESTS:  5xSTS: 26.23 sec pushing off knees and audible R knee crepitus  Pre: 97% spO2, 89bpm  Post: 95% spO2, 102bpm   85M walk test: 17.53 sec with RW (1.87 ft/sec)                                                                                                                  __________________________________________________________________________________ GOALS: Goals reviewed with patient? Yes  SHORT TERM GOALS: Target date: 12/05/2023  Patient to be independent with initial HEP. Baseline: HEP initiated Goal status: MET  LONG TERM GOALS: Target date: 03/12/2024  >04/10/2024  Patient to be independent with advanced HEP. Baseline: Initiated and may need to progress to include high level balance, 03/03/2024  Goal status: IN PROGRESS, 03/03/2024  Patient to demonstrate gait speed of at least 2.0 ft/sec in order to improve access to community.  Baseline: 1.87 ft/sec 12/31/2023; 12.09 sec with RW (2.7 ft/sec) 01/30/24 Goal status: MET  01/30/24  Patient to report tolerance for grocery trip without fatigue limiting.    Baseline: unable; She has been able to tolerate short trips lasting ~10 minutes with her walker but recently went to [employer] at tried a long trip and it felt like it was too much. 01/30/24; able to get through a few trips and fatigues by the time she gets home 03/03/2024 Goal status: MET,03/03/2024  6 min walk test to improve to >600 ft with device as needed Baseline: 437 ft>710 ft in 6 MWT 12/17/23 Goal status: MET 12/17/2023  Patient to demonstrate 5xSTS test in <15 sec in order to decrease risk of falls.  Baseline: 26.23 sec pushing off knees and audible R knee crepitus; 45.41 seconds with arms crossed at chest 5/13; 15.9 sec without UEs 01/30/24; 18.38 sec (end of session 03/03/2024) Goal status: IN PROGRESS, 03/03/2024  6 min walk test to improve to at least 1,000 ft with device as needed Baseline: 838 feet with RW 01/30/24; 900 ft with [facility] 03/03/2024 Goal status: PARTIALLY ME/IN PROGRESS, 03/03/2024  FGA to improve to a least 16/30 for decreased fall risk. Baseline:  11/30 no cane 03/03/2024 Goal status:  INITIAL 03/03/2024     ASSESSMENT:  CLINICAL IMPRESSION: Treadmill training for increased gait speed and endurance to improve  cardiovascular endurance for work demands with good tolerance and increase in her comfortable walking speed to 2.2 mph but requiring BUE support and use of intervals to progress challenge performed at 1:1 ratio. Standing balance activities to improve single limb support and safety with obstacle negotiation requiring SBA-CGA w/ instances of trip/stumble w/ tactile suppport to recover. LE PRE to improve endurance w/ lower weight higher rep count tolerating well without adverse effect. Progressing with POC to meet her upcoming work demands of longer distance/time walking   OBJECTIVE IMPAIRMENTS: Abnormal gait, cardiopulmonary status limiting activity, decreased  activity tolerance, decreased balance, difficulty walking, decreased strength, and pain.   PLAN:  PT FREQUENCY: 1x/wk   PT DURATION: 4 weeks  PLANNED INTERVENTIONS: [id number]- PT Re-evaluation, 97110-Therapeutic exercises, 97530- Therapeutic activity, [id number]- Neuromuscular re-education, 97535- Self Care, [id number]- Manual therapy, [phone]- Gait training, [phone]- Canalith repositioning, [phone]- Aquatic Therapy, Patient/Family education, Balance training, Stair training, Taping, Dry Needling, Vestibular training, DME instructions, Cryotherapy, and Moist heat  PLAN FOR NEXT SESSION: f/u on pt's compliance with HEP; update HEP to include high level balance -anticipate planning for discharge at the end of this new POC-    12:40 PM, 03/16/24 M. Kelly Adom Schoeneck, PT, DPT Physical Therapist- [place] Office Number: [phone]

## 2024-03-17 ENCOUNTER — Telehealth: Payer: Self-pay

## 2024-03-17 NOTE — Telephone Encounter (Signed)
 Please let [name] know that her cholesterol is overall well controlled, no medication changes recommended at this time, will discuss further on follow up. Patient was to have BMET and digoxin  recheck, does not appear this was completed.   [name] NP

## 2024-03-17 NOTE — Telephone Encounter (Signed)
 Left message to call back

## 2024-03-18 NOTE — Progress Notes (Unsigned)
 Cardiology Office Note    Date:  03/19/2024  ID:  Michelle Wilcox [birth year], MRN [id number] PCP:  Aisha Harvey, MD  Cardiologist:  Alm Clay, MD  Electrophysiologist:  Fonda Kitty, MD   Chief Complaint: Follow up for atrial fibrillation   History of Present Illness: .    Michelle Wilcox is a 78 y.o. female with visit-pertinent history of polymyalgia rheumatica, temporal arteritis, diastolic dysfunction and atrial fibrillation.  Patient presented to ED on 11/04/2023 with new onset atrial fibrillation characterized by rapid ventricular response, heart rate in the 150s.  Patient described a fluttering sensation in her chest.  Atrial fibrillation was identified following a cortisone injection in her knee.  In hospital patient reported shortness of breath, dizziness and palpitations for more than a few days, though exact duration was unclear.  She denied any recent chest pain.  Patient was planned for TEE/DCCV.  However TEE showed decreased EF of 40 to 45% and left atrial appendage thrombus so DCCV was unable to be performed.  Given decrease in EF diltiazem  was discontinued, was unable to add p.o. Lopressor  as blood pressures were soft.  Patient was loaded on digoxin .  An attempt for better rate control diltiazem  was started however this resulted in an episode of symptomatic hypotension and a rapid response with fluid bolus.  Patient was started on IV amiodarone  with goal of rate control only.  Patient was discharged on 11/07/2023 on metoprolol  succinate 50 mg twice daily, digoxin  0.125 mg daily and Eliquis .  Patient was started on amiodarone  400 mg twice daily for 5 days followed by 200 mg twice daily for 2 weeks followed by 200 mg daily thereafter.  Patient was seen in clinic on 11/19/2023.  She reported that she was doing well overall, reported that her heart rate had been well-controlled at home.  She reported some intermittent low blood pressures, specifically when she was  at PT earlier that week.  Noted that she had not been hydrating very well, noted improvement with increased hydration.  Patient was last seen in clinic on 12/12/2023, noted that she was having significant anxiety.  She reported that her heart rate at home had been very well-controlled and her blood pressures had improved.  She denied any chest pain, shortness of breath, lower extremity edema.  Patient was scheduled for TEE guided DCCV on May 1st with Dr. Santo.  TEE on 12/19/2023 indicated LVEF of 60 to 65%, RV systolic function was normal, RV size was mildly enlarged, left atrial size was severely dilated, no left atrial/left atrial appendage thrombus was detected, right atrial size was severely dilated, there was a central jet of atrial, functional MR with blunted pulmonary vein signal, mitral valve was normal in structure with moderate to severe mitral valve regurgitation, no evidence of mitral stenosis.  Atrial functional tricuspid regurgitation with 2 jets, tricuspid valve regurgitation was moderate.  There was a large central jet and a smaller, atrial jet between the non and left cusp, aortic valve regurgitation was moderate, no aortic valve stenosis was present.  Patient underwent DCCV with conversion from atrial fibrillation to normal sinus rhythm.  On 12/21/2023 patient return to the ER with palpitation and dyspnea, original ECG with atrial fibrillation.  She was started on IV amiodarone  with conversion to sinus rhythm with PACs.  Initial troponin 544, she was chest pain-free.  Echocardiogram during admission indicated LVEF of 60 to 65%, no RWMA, normal RV systolic function, moderately dilated LA, mild to moderate AI.  BNP  was elevated to 193.3, chest x-ray showed pulmonary vascular congestion, progressive edema, probable small pleural effusions.  Patient was diuresed with IV Lasix .  Patient was started on spironolactone  12.5 mg daily.  It was noted that her elevated troponin was likely due to demand  ischemia however recommended outpatient PET CT. The electrophysiology team was consulted while patient was in the hospital.  It was felt that patient was now paroxysmal while on amiodarone  as there were episodes of patient being in normal sinus rhythm with PACs on her low telemetry.  It was recommended that she wear a 2-week ZIO monitor on discharge to assess A-fib burden, noted that if she was in A-fib a majority of the time then risk of amiodarone  long-term may exceed benefits and transition to rate control strategy may be necessary.  Patient was seen in clinic on 01/10/2024, patient reported that she was fatigued easily when exerting herself and would have to stop and take breaks.  She denied any chest pain.  She reported since starting her new medication she overall simply does not feel well and is eager to explore other options.  Discussed with Dr. Anner, could potentially consider ablation which would likely require pacemaker placement.  Office discussed elevated troponins during recent hospitalization, per Dr. Anner would proceed with right and left heart catheterization given elevated troponins but also allow for evaluation of her valves.  Patients spironolactone  was discontinued.  Patient was last in clinic on 01/29/2024 she reported she was doing well overall.  She denied any chest pain, increased lower extremity edema, orthopnea or PND.  She reported fatigue and shortness of breath with ongoing exertion however felt this had been slightly improved.  Patient wondered if she had an allergy to furosemide , notes history of sulfa allergy and had been having red and itchy earlobes and scalp for the last 2 weeks.  Patient requested decreasing Lasix  back to 20 mg daily.  Patient was seen in clinic by Dr. Kennyth on 02/13/2024.  It was noted the patient had failed amiodarone  and other AAD's are unlikely to be successful.  Cardiac ablation was discussed, patient wished to think about her options, if ablation  was not pursued she would be in permanent atrial fibrillation.   Right and left heart catheterization on 03/04/2024 indicated angiographically normal coronary arteries with a right dominant systems, hemodynamic findings consistent with pulmonary pulmonary hypertension, borderline mild aortic valve stenosis, as recommended patient take 40 mg Lasix  for the next 3 days then 20 mg daily.   Today she presents for follow-up.  She reports that she is doing very well. She denies chest pain, shortness of breath, lower extremity edema, orthopnea or pnd. She has been adherent with her medications. Denies any palpitations, presyncope or syncope. She reports that her blood pressure has been well controlled. She is eager to return to work, plans to follow up with Dr. Kennyth with plans for ablation.  ROS: .   Today she denies chest pain, shortness of breath, lower extremity edema, fatigue, palpitations, melena, hematuria, hemoptysis, diaphoresis, weakness, presyncope, syncope, orthopnea, and PND.  All other systems are reviewed and otherwise negative. Studies Reviewed: SABRA    EKG:  EKG is not ordered today.  CV Studies: Cardiac studies reviewed are outlined and summarized above. Otherwise please see EMR for full report. Cardiac Studies & Procedures   ______________________________________________________________________________________________ CARDIAC CATHETERIZATION  CARDIAC CATHETERIZATION 03/04/2024  Conclusion Images from the original result were not included.    Angiographically normal coronary arteries with a Right Dominant  system, and Ramus Intermedius   Hemodynamic findings consistent with moderate pulmonary hypertension.   Right Heart: PAP-mean 42/29-35 mmHg; PCWP mean 24-26 mmHg Elevated LV EDP consistent with volume overload. LVP-EDP 155/9-24 mmHg; ALP-MAP 153/78-105 mmHg. Waveform not consistent with valvular regurgitation Ao sat 95%, PA sat 62%. Fick Cardiac Output-Index: Moderately reduced at 3.67  and 1.99   There is borderline mild aortic valve stenosis.  Dominance: Right  RECOMMENDATIONS   In the absence of any other complications or medical issues, we expect the patient to be ready for discharge from a cath perspective on 03/04/2024.   Recommend patient takes 40 mg Lasix  for the next 3 days, then 20 mg for 1 day followed by 40 mg for 3 more days then return to 20 mg daily   Recommend to resume Rivaroxaban, at currently prescribed dose and frequency on 03/04/2024.   Concurrent antiplatelet therapy not recommended.    Alm Clay, MD  Findings Coronary Findings Diagnostic  Dominance: Right  Left Main Vessel was injected. Vessel is normal in caliber. Vessel is angiographically normal.  Left Anterior Descending Vessel was injected. Vessel is normal in caliber. Vessel is angiographically normal.  First Diagonal Branch Vessel is small in size.  Second Diagonal Branch Vessel is moderate in size.  Ramus Intermedius Vessel was injected. Vessel is normal in caliber and large. Vessel is angiographically normal.  Left Circumflex Vessel was injected. Vessel is normal in caliber. Vessel is angiographically normal. The vessel is moderately tortuous.  First Obtuse Marginal Branch Vessel is moderate in size. The vessel is tortuous.  Left Posterior Atrioventricular Artery Vessel is small in size.  Right Coronary Artery Vessel was injected. Vessel is large. Vessel is angiographically normal.  Acute Marginal Branch Vessel was injected. Vessel is small in size. Vessel is angiographically normal.  Right Ventricular Branch Vessel was injected. Vessel is small in size. Vessel is angiographically normal.  First Right Posterolateral Branch Vessel was injected. Vessel is small in size. Vessel is angiographically normal.  Intervention  No interventions have been documented.   STRESS TESTS  MYOCARDIAL PERFUSION IMAGING 03/11/2018  Interpretation Summary  The left  ventricular ejection fraction is hyperdynamic (>65%).  Nuclear stress EF: 69%.  There was no ST segment deviation noted during stress.  The study is normal.  This is a low risk study.  Low risk stress nuclear study with normal perfusion and normal left ventricular regional and global systolic function.   ECHOCARDIOGRAM  ECHOCARDIOGRAM COMPLETE 12/22/2023  Narrative ECHOCARDIOGRAM REPORT    Patient Name:   RENAE MOTTLEY Date of Exam: 12/22/2023 Medical Rec #:  [id number]               Height:       63.0 in Accession #:    [id number]              Weight:       184.1 lb Date of Birth:  07/07/1946               BSA:          1.867 m Patient Age:    77 years                BP:           118/56 mmHg Patient Gender: F                       HR:           71  bpm. Exam Location:  Inpatient  Procedure: 2D Echo (Both Spectral and Color Flow Doppler were utilized during procedure).  Indications:    acute diastolic chf  History:        Patient has prior history of Echocardiogram examinations, most recent 12/19/2023. Risk Factors:Hypertension and Dyslipidemia.  Sonographer:    Tinnie Barefoot RDCS Referring Phys: [id number] PETER C NISHAN  IMPRESSIONS   1. Left ventricular ejection fraction, by estimation, is 60 to 65%. The left ventricle has normal function. The left ventricle has no regional wall motion abnormalities. Left ventricular diastolic parameters are indeterminate. 2. Right ventricular systolic function is normal. The right ventricular size is normal. Tricuspid regurgitation signal is inadequate for assessing PA pressure. 3. Left atrial size was moderately dilated. 4. The mitral valve is normal in structure. Trivial mitral valve regurgitation. No evidence of mitral stenosis. 5. The aortic valve is tricuspid. Aortic valve regurgitation is mild to moderate. No aortic stenosis is present. 6. The inferior vena cava is dilated in size with >50% respiratory variability,  suggesting right atrial pressure of 8 mmHg.  FINDINGS Left Ventricle: Left ventricular ejection fraction, by estimation, is 60 to 65%. The left ventricle has normal function. The left ventricle has no regional wall motion abnormalities. The left ventricular internal cavity size was normal in size. There is no left ventricular hypertrophy. Left ventricular diastolic parameters are indeterminate.  Right Ventricle: The right ventricular size is normal. Right vetricular wall thickness was not well visualized. Right ventricular systolic function is normal. Tricuspid regurgitation signal is inadequate for assessing PA pressure.  Left Atrium: Left atrial size was moderately dilated.  Right Atrium: Right atrial size was normal in size.  Pericardium: There is no evidence of pericardial effusion.  Mitral Valve: The mitral valve is normal in structure. Trivial mitral valve regurgitation. No evidence of mitral valve stenosis.  Tricuspid Valve: The tricuspid valve is normal in structure. Tricuspid valve regurgitation is trivial. No evidence of tricuspid stenosis.  Aortic Valve: The aortic valve is tricuspid. Aortic valve regurgitation is mild to moderate. Aortic regurgitation PHT measures 385 msec. No aortic stenosis is present. Aortic valve mean gradient measures 4.4 mmHg. Aortic valve peak gradient measures 11.0 mmHg. Aortic valve area, by VTI measures 2.17 cm.  Pulmonic Valve: The pulmonic valve was not well visualized. Pulmonic valve regurgitation is not visualized. No evidence of pulmonic stenosis.  Aorta: The aortic root and ascending aorta are structurally normal, with no evidence of dilitation.  Venous: The inferior vena cava is dilated in size with greater than 50% respiratory variability, suggesting right atrial pressure of 8 mmHg.  IAS/Shunts: The interatrial septum was not well visualized.   LEFT VENTRICLE PLAX 2D LVIDd:         5.20 cm   Diastology LVIDs:         3.50 cm   LV e'  medial:    6.85 cm/s LV PW:         0.70 cm   LV E/e' medial:  11.8 LV IVS:        0.90 cm   LV e' lateral:   8.92 cm/s LVOT diam:     1.80 cm   LV E/e' lateral: 9.0 LV SV:         56 LV SV Index:   30 LVOT Area:     2.54 cm   RIGHT VENTRICLE          IVC RV Basal diam:  3.00 cm  IVC diam: 2.50 cm  LEFT  ATRIUM           Index        RIGHT ATRIUM           Index LA diam:      4.60 cm 2.46 cm/m   RA Area:     16.40 cm LA Vol (A2C): 87.6 ml 46.93 ml/m  RA Volume:   40.70 ml  21.80 ml/m LA Vol (A4C): 87.3 ml 46.77 ml/m AORTIC VALVE AV Area (Vmax):    1.75 cm AV Area (Vmean):   2.00 cm AV Area (VTI):     2.17 cm AV Vmax:           165.60 cm/s AV Vmean:          93.719 cm/s AV VTI:            0.261 m AV Peak Grad:      11.0 mmHg AV Mean Grad:      4.4 mmHg LVOT Vmax:         114.00 cm/s LVOT Vmean:        73.700 cm/s LVOT VTI:          0.222 m LVOT/AV VTI ratio: 0.85 AI PHT:            385 msec  AORTA Ao Root diam: 2.90 cm Ao Asc diam:  3.30 cm  MITRAL VALVE MV Area (PHT): 3.60 cm    SHUNTS MV Decel Time: 211 msec    Systemic VTI:  0.22 m MV E velocity: 80.50 cm/s  Systemic Diam: 1.80 cm MV A velocity: 56.80 cm/s MV E/A ratio:  1.42  Dorn Ross MD Electronically signed by Dorn Ross MD Signature Date/Time: 12/22/2023/6:04:31 PM    Final   TEE  ECHO TEE 12/19/2023  Narrative TRANSESOPHOGEAL ECHO REPORT    Patient Name:   Michelle Wilcox Date of Exam: 12/19/2023 Medical Rec #:  [id number]               Height:       63.0 in Accession #:    [id number]              Weight:       184.0 lb Date of Birth:  07/29/1946               BSA:          1.866 m Patient Age:    77 years                BP:           156/89 mmHg Patient Gender: F                       HR:           102 bpm. Exam Location:  Inpatient  Procedure: Transesophageal Echo, 3D Echo, Color Doppler, Cardiac Doppler and Intracardiac Opacification Agent (Both Spectral and Color  Flow Doppler were utilized during procedure).  Indications:     I48.91* Unspecified atrial fibrillation  History:         Patient has prior history of Echocardiogram examinations, most recent 11/05/2023. Arrythmias:Atrial Fibrillation; Risk Factors:Hypertension and Dyslipidemia.  Sonographer:     Damien Senior RDCS Referring Phys:  Erik Nessel D Laikyn Gewirtz Diagnosing Phys: Stanly Leavens MD  PROCEDURE: After discussion of the risks and benefits of a TEE, an informed consent was obtained from the patient. The transesophogeal probe was passed without difficulty through the esophogus of the patient.  Sedation performed by different physician. The patient was monitored while under deep sedation. Anesthestetic sedation was provided intravenously by Anesthesiology: 160mg  of Propofol . The patient developed no complications during the procedure. A successful direct current cardioversion was performed at 200 joules with 1 attempt.  IMPRESSIONS   1. Left ventricular ejection fraction, by estimation, is 60 to 65%. Left ventricular ejection fraction by 3D volume is 65 %. The left ventricle has normal function. 2. Right ventricular systolic function is normal. The right ventricular size is mildly enlarged. 3. Left atrial size was severely dilated. No left atrial/left atrial appendage thrombus was detected. 4. Right atrial size was severely dilated. 5. Central jet of atrial, functional MR with blunted pulmonary vein signal. The mitral valve is normal in structure. Moderate to severe mitral valve regurgitation. No evidence of mitral stenosis. 6. Atrial functional tricuspid regurgitation- two jets. Tricuspid valve regurgitation is moderate. 7. There is a larger central jet and a smaller, commissural jet between the non and left cusp; central jet 2DVC: 0.3 cm. The aortic valve is tricuspid. Aortic valve regurgitation is moderate. No aortic stenosis is present. 8. 3D performed of the LAA and demonstrates Patent  LAA- Chicken wing morphology. 23 mm maximal diameter. 9. Cannot exclude a small PFO.  FINDINGS Left Ventricle: Left ventricular ejection fraction, by estimation, is 60 to 65%. Left ventricular ejection fraction by 3D volume is 65 %. The left ventricle has normal function. Definity  contrast agent was given IV to delineate the left ventricular endocardial borders. The left ventricular internal cavity size was normal in size.  Right Ventricle: The right ventricular size is mildly enlarged. No increase in right ventricular wall thickness. Right ventricular systolic function is normal.  Left Atrium: Left atrial size was severely dilated. No left atrial/left atrial appendage thrombus was detected.  Right Atrium: Right atrial size was severely dilated.  Pericardium: There is no evidence of pericardial effusion.  Mitral Valve: Central jet of atrial, functional MR with blunted pulmonary vein signal. The mitral valve is normal in structure. Moderate to severe mitral valve regurgitation. No evidence of mitral valve stenosis.  Tricuspid Valve: Atrial functional tricuspid regurgitation- two jets. The tricuspid valve is normal in structure. Tricuspid valve regurgitation is moderate . No evidence of tricuspid stenosis.  Aortic Valve: There is a larger central jet and a smaller, commissural jet between the non and left cusp; central jet 2DVC: 0.3 cm. The aortic valve is tricuspid. Aortic valve regurgitation is moderate. No aortic stenosis is present.  Pulmonic Valve: The pulmonic valve was normal in structure. Pulmonic valve regurgitation is trivial. No evidence of pulmonic stenosis.  Aorta: The aortic root and ascending aorta are structurally normal, with no evidence of dilitation.  IAS/Shunts: There is right bowing of the interatrial septum, suggestive of elevated left atrial pressure. Cannot exclude a small PFO.  Additional Comments: 3D was performed not requiring image post processing on an  independent workstation and was normal. Spectral Doppler performed.   3D Volume EF LV 3D EF:    Left ventricular ejection fraction by 3D volume is 65 %. LV 3D EDV:   47.36 ml LV 3D ESV:   16.50 ml  3D Volume EF: 3D EF:        65 %  Stanly Leavens MD Electronically signed by Stanly Leavens MD Signature Date/Time: 12/19/2023/12:39:33 PM    Final  MONITORS  LONG TERM MONITOR (3-14 DAYS) 01/17/2024  Narrative   ~14-day Zio patch monitor (May 2025)   Predominantly sinus rhythm is atrial fibrillation (  100% burden) heart rate range 42 to 165 bpm and average of 74 bpm.   1 Ventricular Run of 5 Beats with a max rate of 138 bpm and average of 150 bpm.   Occasional (1.1%) PVCs noted.  Rare couplets with no bigeminy or trigeminy.   Findings of 100% A-fib burden noted and discussed in clinic.   Alm Clay, MD       ______________________________________________________________________________________________       Current Reported Medications:.    Current Meds  Medication Sig   acetaminophen  (TYLENOL ) 650 MG CR tablet Take 1,300 mg by mouth 2 (two) times daily.   ALPRAZolam  (XANAX ) 0.25 MG tablet Take 0.25 mg by mouth 2 (two) times daily as needed for anxiety.   apixaban  (ELIQUIS ) 5 MG TABS tablet Take 1 tablet (5 mg total) by mouth 2 (two) times daily.   cholecalciferol  (VITAMIN D3) 25 MCG (1000 UNIT) tablet Take 1,000 Units by mouth every other day.   diclofenac  Sodium (VOLTAREN ) 1 % GEL Apply 4 g topically as needed (Hands, knees, feet).   digoxin  (LANOXIN ) 0.125 MG tablet Take 0.5 tablets (0.0625 mg total) by mouth daily.   folic acid  (FOLVITE ) 1 MG tablet TAKE 1 TABLET(1 MG) BY MOUTH DAILY   furosemide  (LASIX ) 20 MG tablet Take 1 tablet (20 mg total) by mouth daily. Take an additional dose once a day as needed for lower extremity Edema.   gabapentin  (NEURONTIN ) 100 MG capsule Take 200 mg by mouth 2 (two) times daily.   lactase (LACTAID) 3000 units tablet  Take 9,000-15,000 Units by mouth as needed (consuming dairy products).   loratadine  (CLARITIN ) 10 MG tablet Take 10 mg by mouth daily.   metoprolol  succinate (TOPROL -XL) 50 MG 24 hr tablet Take 1.5 tablets (75 mg total) by mouth in the morning and at bedtime. Take with or immediately following a meal.   Multiple Vitamin (MULTIVITAMIN) tablet Take 1 tablet by mouth at bedtime. One-A-Day Women's Vitamin   potassium chloride  (KLOR-CON ) 10 MEQ tablet Take 1 tablet (10 mEq total) by mouth daily.   predniSONE  (DELTASONE ) 5 MG tablet Take 1 tablet (5 mg total) by mouth daily with breakfast.   Probiotic Product (ALIGN) 4 MG CAPS Take 4 mg by mouth every morning.   rosuvastatin  (CRESTOR ) 5 MG tablet Take 5 mg by mouth every other day.   VENTOLIN  HFA 108 (90 Base) MCG/ACT inhaler Take 1-2 puffs by mouth every 4 (four) hours as needed for shortness of breath.    Physical Exam:    VS:  BP 128/82   Pulse 76   Ht 5' 3 (1.6 m)   Wt 180 lb (81.6 kg)   SpO2 94%   BMI 31.89 kg/m    Wt Readings from Last 3 Encounters:  03/19/24 180 lb (81.6 kg)  03/04/24 178 lb (80.7 kg)  02/25/24 178 lb (80.7 kg)    GEN: Well nourished, well developed in no acute distress NECK: No JVD; No carotid bruits CARDIAC: IRIR, no murmurs, rubs, gallops RESPIRATORY:  Clear to auscultation without rales, wheezing or rhonchi, cath site is clean and intact without evidence of hematoma  ABDOMEN: Soft, non-tender, non-distended EXTREMITIES:  No edema; No acute deformity     Asessement and Plan:.    Elevated troponin/valve disease:  During admission in 12/2023 patient's troponin level increased from 28>>544.  Ischemic evaluation was not pursued while inpatient, felt to be related to demand ischemia from A-fib and hypervolemia with recommended ischemic evaluation in outpatient setting.  TEE on 5/1  noted moderate to severe mitral valve regurgitation (functional MR), moderate TR, moderate AI.  Echo on 12/22/2023 indicated EF 60 to 65%,  trivial MR, mild to moderate aortic valve regurgitation. Right and left heart catheterization on 03/04/2024 indicated angiographically normal coronary arteries with a right dominant systems, hemodynamic findings consistent with pulmonary pulmonary hypertension, borderline mild aortic valve stenosis. Patients lasix  was increased following procedure. Today she reports she is doing well, she denies chest pain, shortness of breath, lower extremity edema, orthopnea or pnd. Cath site is clean and intact without evidence of hematoma. Continue Eliquis  5 mg twice daily, dixogin 0.0625 mg daily, lasix  20 mg daily, metoprolol  succinate 75 mg daily, rosuvastatin  5 mg daily. Check BMET and digoxin  level.   Persistent atrial fibrillation: Patient first diagnosed with A-fib in 10/2023, started on amiodarone , digoxin  and metoprolol . She underwent TEE guided DCCV on 5/1, TEE noted severe biatrial enlargement, moderate to severe MR, moderate TR. Patient presented 5/2 with shortness of breath was found to be back in A-fib with RVR with evidence of hypervolemia. She was again loaded with IV amiodarone . Noted it would likely be difficult to maintain sinus rhythm given biatrial enlargement and valvular disease. Patient now being followed by EP, cardiac monitor showed 100% burden of atrial fibrillation.  She was seen by Dr. Kennyth on 6/26 with recommendation for consideration of ablation, she plans to follow up with Dr. Kennyth with plans for ablation. Patient denies any bleeding problems on Eliquis , reports numerous medications.  Continue metoprolol  succinate 50 mg twice daily, digoxin , Eliquis  5 mg twice daily. Check Digoxin  level today.   Chronic diastolic heart failure: Echocardiogram on 12/23/2023 indicated LVEF 66 5%, no RWMA, normal RV systolic function, moderately dilated LA, mild to moderate AI. Patient's BNP was elevated to 193.3, chest x-ray showed pulmonary vascular congestion, progressive edema, probable small pleural  effusions, she underwent IV diuresis.  She was discharged on spironolactone  12.5 mg daily and Lasix  40 mg daily however patient on follow-up reported low blood pressures with increased fatigue and weakness.  Per Dr. Anner discontinued spironolactone . At last office visit patient requested reduction in dose of Lasix  to 20 mg daily as she felt she was having allergic reactions consisting of an itchy scalp and earlobes on higher doses, this was reduced. Today she reports that she is doing well denies any shortness of breath , orthopnea or PND.  She denies any lower extremity edema.  Patient reports that she has done well on Lasix  20 mg daily.  Today she appears euvolemic and well compensated on exam.  Continue Lasix  20 mg daily.  Hyperlipidemia: Last lip profile on 03/11/2024 indicated total cholesterol 149, HDL 55, triglycerides 154 and LDL 68.  Continue Crestor  5 mg daily.   Disposition: F/u with Dr. Anner in 3-4 months, Dr. Kennyth at next available appointment to discuss ablation.   Signed, Conn Trombetta D Demetrion Wesby, NP

## 2024-03-19 ENCOUNTER — Encounter: Payer: Self-pay | Admitting: Cardiology

## 2024-03-19 ENCOUNTER — Ambulatory Visit: Attending: Cardiology | Admitting: Cardiology

## 2024-03-19 VITALS — BP 128/82 | HR 76 | Ht 63.0 in | Wt 180.0 lb

## 2024-03-19 DIAGNOSIS — I5032 Chronic diastolic (congestive) heart failure: Secondary | ICD-10-CM | POA: Diagnosis not present

## 2024-03-19 DIAGNOSIS — I351 Nonrheumatic aortic (valve) insufficiency: Secondary | ICD-10-CM

## 2024-03-19 DIAGNOSIS — R7989 Other specified abnormal findings of blood chemistry: Secondary | ICD-10-CM | POA: Diagnosis not present

## 2024-03-19 DIAGNOSIS — I4819 Other persistent atrial fibrillation: Secondary | ICD-10-CM | POA: Diagnosis not present

## 2024-03-19 DIAGNOSIS — E782 Mixed hyperlipidemia: Secondary | ICD-10-CM

## 2024-03-19 NOTE — Patient Instructions (Signed)
 Medication Instructions:  No changes *If you need a refill on your cardiac medications before your next appointment, please call your pharmacy*  Lab Work: Please come back to tomorrow to get labs drawn without taking digoxin  If you have labs (blood work) drawn today and your tests are completely normal, you will receive your results only by: MyChart Message (if you have MyChart) OR A paper copy in the mail If you have any lab test that is abnormal or we need to change your treatment, we will call you to review the results.  Testing/Procedures: No testing  Follow-Up: At [facility], you and your health needs are our priority.  As part of our continuing mission to provide you with exceptional heart care, our providers are all part of one team.  This team includes your primary Cardiologist (physician) and Advanced Practice Providers or APPs (Physician Assistants and Nurse Practitioners) who all work together to provide you with the care you need, when you need it.  Your next appointment:   3-4 month(s)  Provider:   Alm Clay, MD  Other Appointments: Follow up with Dr. Kennyth to discuss possible ablation   We recommend signing up for the patient portal called MyChart.  Sign up information is provided on this After Visit Summary.  MyChart is used to connect with patients for Virtual Visits (Telemedicine).  Patients are able to view lab/test results, encounter notes, upcoming appointments, etc.  Non-urgent messages can be sent to your provider as well.   To learn more about what you can do with MyChart, go to [email or web address].

## 2024-03-20 ENCOUNTER — Telehealth: Payer: Self-pay | Admitting: Cardiology

## 2024-03-20 ENCOUNTER — Encounter: Payer: Self-pay | Admitting: Cardiology

## 2024-03-20 DIAGNOSIS — I4819 Other persistent atrial fibrillation: Secondary | ICD-10-CM | POA: Diagnosis not present

## 2024-03-20 DIAGNOSIS — I340 Nonrheumatic mitral (valve) insufficiency: Secondary | ICD-10-CM | POA: Diagnosis not present

## 2024-03-20 DIAGNOSIS — I5032 Chronic diastolic (congestive) heart failure: Secondary | ICD-10-CM | POA: Diagnosis not present

## 2024-03-20 DIAGNOSIS — Z79899 Other long term (current) drug therapy: Secondary | ICD-10-CM | POA: Diagnosis not present

## 2024-03-20 NOTE — Telephone Encounter (Signed)
 Patient says [facility] never used to require her to pay [employer], she normally paid about $5, but now they are requiring $130 up front for labs. She says they informed her that they can hold it for  up to 30 days.

## 2024-03-20 NOTE — Telephone Encounter (Signed)
 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

## 2024-03-21 LAB — BASIC METABOLIC PANEL WITH GFR
BUN/Creatinine Ratio: 17 (ref 12–28)
BUN: 14 mg/dL (ref 8–27)
CO2: 25 mmol/L (ref 20–29)
Calcium: 9.2 mg/dL (ref 8.7–10.3)
Chloride: 100 mmol/L (ref 96–106)
Creatinine, Ser: 0.81 mg/dL (ref 0.57–1.00)
Glucose: 97 mg/dL (ref 70–99)
Potassium: 4.2 mmol/L (ref 3.5–5.2)
Sodium: 141 mmol/L (ref 134–144)
eGFR: 74 mL/min/1.73 (ref >59)

## 2024-03-21 LAB — DIGOXIN LEVEL: Digoxin, Serum: 0.4 ng/mL — ABNORMAL LOW (ref 0.5–0.9)

## 2024-03-23 ENCOUNTER — Ambulatory Visit: Admitting: Physical Therapy

## 2024-03-24 NOTE — Telephone Encounter (Signed)
 Left message to call back

## 2024-03-24 NOTE — Telephone Encounter (Signed)
-----   Message from Katlyn D West sent at 03/22/2024 10:02 AM EDT ----- Please let Ms. Jaskot know that her digoxin  level is in normal range, her kidney function and electrolytes are normal. Good results! Continue current medications and follow up with Dr. Kennyth as  planned.  ----- Message ----- From: Rebecka Memos Lab Results In Sent: 03/21/2024   4:36 AM EDT To: Katlyn D West, NP

## 2024-03-25 NOTE — Telephone Encounter (Signed)
 Pt returning nurse call in regards to results.

## 2024-03-25 NOTE — Telephone Encounter (Signed)
 Spoke with pt and went over Michelle Wilcox's result note regarding the most recent lab work. Pt asked again about an issue with payment at [employer] and pt was advised to discuss with [employer] and her insurance company due to our office not being involved with any type of billing from [employer]. Pt verbalized understanding and had no further questions at this time.

## 2024-03-26 ENCOUNTER — Ambulatory Visit: Attending: Internal Medicine | Admitting: Physical Therapy

## 2024-03-26 ENCOUNTER — Encounter: Payer: Self-pay | Admitting: Physical Therapy

## 2024-03-26 DIAGNOSIS — R2681 Unsteadiness on feet: Secondary | ICD-10-CM | POA: Insufficient documentation

## 2024-03-26 DIAGNOSIS — M6281 Muscle weakness (generalized): Secondary | ICD-10-CM | POA: Diagnosis not present

## 2024-03-26 DIAGNOSIS — R2689 Other abnormalities of gait and mobility: Secondary | ICD-10-CM | POA: Diagnosis not present

## 2024-03-26 NOTE — Telephone Encounter (Signed)
 Called patient advised of below they verbalized understanding.

## 2024-03-26 NOTE — Telephone Encounter (Signed)
-----   Message from Katlyn D West sent at 03/22/2024 10:02 AM EDT ----- Please let Michelle Wilcox know that her digoxin  level is in normal range, her kidney function and electrolytes are normal. Good results! Continue current medications and follow up with Dr. Kennyth as  planned.  ----- Message ----- From: Rebecka Memos Lab Results In Sent: 03/21/2024   4:36 AM EDT To: Katlyn D West, NP

## 2024-03-26 NOTE — Therapy (Unsigned)
 OUTPATIENT PHYSICAL THERAPY NEURO TREATMENT NOTE   Patient Name: Michelle Wilcox MRN: [id number] DOB:December 01, 1945, 78 y.o., female Today's Date: 03/27/2024  PCP: Aisha Harvey, MD REFERRING PROVIDER: Christobal Guadalajara, MD        END OF SESSION:  PT End of Session - 03/26/24 1403     Visit Number 30    Number of Visits 31    Date for PT Re-Evaluation 04/10/24    Authorization Type [employer] Medicare    Progress Note Due on Visit --   PN done at visit 27   PT Start Time 1401    PT Stop Time 1442    PT Time Calculation (min) 41 min    Equipment Utilized During Treatment Gait belt    Activity Tolerance Patient tolerated treatment well    Behavior During Therapy WFL for tasks assessed/performed                     Past Medical History:  Diagnosis Date   Anxiety    hx of   Asthma    at times   CHF (congestive heart failure) (HCC)    Depression    hx of   Dyspnea    Giant cell arteritis (HCC)    Gouty arthropathy    Hemolytic anemia (HCC)    Hyperlipidemia    Hypertension    Irregular heart beat    extra beat   Migraines    Polymyalgia (HCC)    Polymyalgia rheumatica (HCC)    Temporal arteritis (HCC)    Past Surgical History:  Procedure Laterality Date   BARTHOLIN GLAND CYST EXCISION     non ca   BREAST BIOPSY Right    CARDIOVERSION N/A 11/05/2023   Procedure: CARDIOVERSION;  Surgeon: Sheena Pugh, DO;  Location: [facility] INVASIVE CV LAB;  Service: Cardiovascular;  Laterality: N/A;   CARDIOVERSION N/A 12/19/2023   Procedure: CARDIOVERSION;  Surgeon: Santo Stanly LABOR, MD;  Location: [facility] INVASIVE CV [facility];  Service: Cardiovascular;  Laterality: N/A;   EYE MUSCLE SURGERY     as a child 68 yrs old   KNEE SURGERY  2004   rt knee   LAPAROSCOPIC SPLENECTOMY N/A 06/18/2018   Procedure: LAPAROSCOPIC SPLENECTOMY ERAS PATHWAY;  Surgeon: Mikell Katz, MD;  Location: [facility];  Service: General;  Laterality: N/A;   RIGHT/LEFT HEART CATH AND CORONARY ANGIOGRAPHY  N/A 03/04/2024   Procedure: RIGHT/LEFT HEART CATH AND CORONARY ANGIOGRAPHY;  Surgeon: Anner Alm ORN, MD;  Location: [facility] INVASIVE CV LAB;  Service: Cardiovascular;  Laterality: N/A;   toncil     TONSILLECTOMY     as a child   TRANSESOPHAGEAL ECHOCARDIOGRAM (CATH LAB) N/A 11/05/2023   Procedure: TRANSESOPHAGEAL ECHOCARDIOGRAM;  Surgeon: Sheena Pugh, DO;  Location: [facility];  Service: Cardiovascular;  Laterality: N/A;   TRANSESOPHAGEAL ECHOCARDIOGRAM (CATH LAB) N/A 12/19/2023   Procedure: TRANSESOPHAGEAL ECHOCARDIOGRAM;  Surgeon: Santo Stanly LABOR, MD;  Location: [facility] CV [facility];  Service: Cardiovascular;  Laterality: N/A;   Patient Active Problem List   Diagnosis Date Noted   Acute on chronic congestive heart failure (HCC) 12/24/2023   Acute on chronic HFrEF (heart failure with reduced ejection fraction) (HCC) 12/21/2023   Atrial fibrillation with rapid ventricular response (HCC) 12/21/2023   Atrial fibrillation, rapid (HCC) 12/21/2023   Pericardial effusion 11/07/2023   Atrial fibrillation with RVR (HCC) 11/05/2023   Borderline abnormal TFTs 11/05/2023   Macrocytosis 11/05/2023   Atrial fibrillation (HCC) 11/04/2023   PMR (polymyalgia rheumatica) (HCC) 11/04/2023  Primary localized osteoarthrosis of multiple sites 06/15/2022   Diastolic dysfunction without heart failure 03/05/2018   DOE (dyspnea on exertion) 03/05/2018   Preop cardiovascular exam 03/05/2018   Mild reactive airways disease 08/04/2017   Volume overload 08/03/2017   Hypokalemia 08/03/2017   Essential hypertension 08/03/2017   Hyperlipidemia 08/03/2017   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   Hypersensitivity reaction 11/20/2016   Drusen (degenerative) of retina, bilateral 05/22/2016   Nuclear cataract 05/22/2016   Retinal hemorrhage of left eye 05/22/2016   Temporal arteritis (HCC) 05/22/2016   Hemolytic anemia (HCC) 03/19/2016   Breast mass, right 07/01/2012    ONSET DATE:  11/04/23  REFERRING DIAG: I48.91 (ICD-10-CM) - Atrial fibrillation with RVR (HCC)  THERAPY DIAG:  Other abnormalities of gait and mobility  Unsteadiness on feet  Muscle weakness (generalized)  Rationale for Evaluation and Treatment: Rehabilitation  SUBJECTIVE:                                                                                                                                                                                             SUBJECTIVE STATEMENT: Did return to work; was very busy and fatigued at the end of the first day, but since then, I have been fine.  May be looking at the ablation in the future, not sure when. Pt accompanied by: self PERTINENT HISTORY: 12/31/2023:  Pt underwent attempt at cardioversion for a-fib on 12/19/23, which did not last >24 hrs; was hospitalized after ED visit for SOB, CHF (*New orders received for PT resume and treat).  Anxiety, asthma, CHF, depression, gouty arthropathy, anemia, HLD, HTN, migraines, polymyalgia rheumatica, temporal arteritis, R knee surgery   PAIN:  Are you having pain? Yes: NPRS scale: R wrist is a little sore Pain location: R wrist Pain description: sore Aggravating factors: heart cath Relieving factors: nothing  PRECAUTIONS: Fall; per Cardiac NP's recommendation: goal resting HR is <110bpm. goal heart rate is <135 bpm with exertion.  12/31/2023:  Have HR monitor (will wear for 14 days).  Monitor BP  WEIGHT BEARING RESTRICTIONS: No  FALLS: Has patient fallen in last 6 months? Yes. Number of falls 1  LIVING ENVIRONMENT: Lives with: lives with their spouse who has stage 4 stomach CA (pt reports that she is having to physically assist him some) Lives in: Other townhouse  Stairs: 2 stories, sleeps on 1st floor in recliner  Has following equipment at home: Walker - 2 wheeled, cane  PLOF: Independent; currently getting assistance with meals   PATIENT GOALS: improve fatigue   OBJECTIVE:    TODAY'S TREATMENT:  03/26/2024 Activity Comments  Vitals:  O2 95%, HR 86  bpm; 131/90   Treadmill training intervalsx 7.5 min  2 min warm-up 2 mph -30 sec 2.2 mph/2 mph recovery . RPE 7/10 -CGA throughout and therapist controlling speed as she requires BUE support on rails. 0.24 miles distance  Vitals:  96%, 93 bpm   Forward/back walking x 2 min Sidestepping x 2 min Fatigued at end, no overt LOB  BLE PRE 3x15 reps, 3#>4# for last set -LAQ -standing march -standing hamstring curl, 2 x 15 reps, 4#          PATIENT EDUCATION: Education details: Use of 4# weights for leg strengthening at home; she has adjustable weights and is agreeable to starting at 4#  Person educated: Patient Education method: Explanation, Demonstration, Tactile cues, Verbal cues, and Handouts Education comprehension: verbalized understanding, returned demonstration, verbal cues required, and tactile cues required    HOME EXERCISE PROGRAM Access Code: [email or web address] URL: [email or web address] Date: 03/09/2024 Prepared by: [facility] - Outpatient  Rehab - [facility]  Program Notes you can get a pair of adjustable ankle weights ranging from 1-5 lbs from [email or web address]   Exercises - Sit to Stand with Arms Crossed  - 1 x daily - 5 x weekly - 2 sets - 10 reps - Standing Hamstring Curl with Chair Support  - 1 x daily - 7 x weekly - 3 sets - 10-15 reps - Standing March with Counter Support  - 1 x daily - 7 x weekly - 2-3 sets - 1 min hold - Side Stepping with Counter Support  - 1 x daily - 5 x weekly - 2 sets - 1 min hold - Forward Step Up  - 1 x daily - 5 x weekly - 2-3 sets - 30 sec hold - Walking with Eyes Closed and Counter Support  - 1 x daily - 5 x weekly - 2 sets - 5 reps - Standing Near Stance in Corner with Eyes Closed  - 1 x daily - 5 x weekly - 3 sets - 30 sec hold    ______________________________________________ Note: Objective measures were completed at Evaluation unless otherwise noted.  Vitals: 96% spO2,  97 bpm (102 bpm taken manually),  115/85 mmHg  DIAGNOSTIC FINDINGS: 11/05/23 echo TEE: Left Atrium: Left atrial size was severely dilated. A left atrial/left  atrial appendage thrombus was detected.   COGNITION: Overall cognitive status: Within functional limits for tasks assessed   SENSATION: WFL  POSTURE: rounded shoulders and forward head  LOWER EXTREMITY MMT:   MMT (in sitting) Right Eval Left Eval  Hip flexion 4+ 4+  Hip extension    Hip abduction 4+ 4+  Hip adduction 4+ 4+  Hip internal rotation    Hip external rotation    Knee flexion 4+ 4+  Knee extension 4+ 4  Ankle dorsiflexion 4+ 4+  Ankle plantarflexion 4 4  Ankle inversion    Ankle eversion    (Blank rows = not tested)  GAIT: Gait pattern: slow gait speed, pushing walker too far forward Assistive device utilized: Walker - 2 wheeled Level of assistance: SBA  FUNCTIONAL TESTS:  5xSTS: 26.23 sec pushing off knees and audible R knee crepitus  Pre: 97% spO2, 89bpm  Post: 95% spO2, 102bpm   56M walk test: 17.53 sec with RW (1.87 ft/sec)  __________________________________________________________________________________ GOALS: Goals reviewed with patient? Yes  SHORT TERM GOALS: Target date: 12/05/2023  Patient to be independent with initial HEP. Baseline: HEP initiated Goal status: MET  LONG TERM GOALS: Target date: 03/12/2024  >04/10/2024  Patient to be independent with advanced HEP. Baseline: Initiated and may need to progress to include high level balance, 03/03/2024  Goal status: IN PROGRESS, 03/03/2024  Patient to demonstrate gait speed of at least 2.0 ft/sec in order to improve access to community.  Baseline: 1.87 ft/sec 12/31/2023; 12.09 sec with RW (2.7 ft/sec) 01/30/24 Goal status: MET  01/30/24  Patient to report tolerance for grocery trip without fatigue limiting.    Baseline: unable;  She has been able to tolerate short trips lasting ~10 minutes with her walker but recently went to [employer] at tried a long trip and it felt like it was too much. 01/30/24; able to get through a few trips and fatigues by the time she gets home 03/03/2024 Goal status: MET,03/03/2024  6 min walk test to improve to >600 ft with device as needed Baseline: 437 ft>710 ft in 6 MWT 12/17/23 Goal status: MET 12/17/2023  Patient to demonstrate 5xSTS test in <15 sec in order to decrease risk of falls.  Baseline: 26.23 sec pushing off knees and audible R knee crepitus; 45.41 seconds with arms crossed at chest 5/13; 15.9 sec without UEs 01/30/24; 18.38 sec (end of session 03/03/2024) Goal status: IN PROGRESS, 03/03/2024  6 min walk test to improve to at least 1,000 ft with device as needed Baseline: 838 feet with RW 01/30/24; 900 ft with [facility] 03/03/2024 Goal status: PARTIALLY ME/IN PROGRESS, 03/03/2024  FGA to improve to a least 16/30 for decreased fall risk. Baseline:  11/30 no cane 03/03/2024 Goal status:  INITIAL 03/03/2024     ASSESSMENT:  CLINICAL IMPRESSION: Pt presents today and reports that return to work went well.  She was fatigued at the end of the first day back, but she reports that she can tell her balance is better, with less wide BOS and more confidence. Skilled PT session focused on continued gait and dynamic balance work as well as leg strengthening.  She has gotten new adjustable weights for home and 4# seems to work well to start for her leg strengthening. She is progressing well and should be ready for planned d/c in the next 1-2 visits.  OBJECTIVE IMPAIRMENTS: Abnormal gait, cardiopulmonary status limiting activity, decreased activity tolerance, decreased balance, difficulty walking, decreased strength, and pain.   PLAN:  PT FREQUENCY: 1x/wk   PT DURATION: 4 weeks  PLANNED INTERVENTIONS: 97164- PT Re-evaluation, 97110-Therapeutic exercises, 97530- Therapeutic activity, [id number]-  Neuromuscular re-education, 97535- Self Care, [id number]- Manual therapy, [id number]- Gait training, [id number]- Canalith repositioning, [id number]- Aquatic Therapy, Patient/Family education, Balance training, Stair training, Taping, Dry Needling, Vestibular training, DME instructions, Cryotherapy, and Moist heat  PLAN FOR NEXT SESSION: Check LTGs.  Next visit is last in POC (even though she is scheduled further) and we should discuss d/c.     Greig Anon, PT 03/27/24 8:33 AM Phone: [phone] Fax: [phone]  [facility] at [facility] [address] [place], [address] [place], [place] [address] Phone # [phone] Fax # [phone]

## 2024-03-27 NOTE — Telephone Encounter (Signed)
 Called patient left Katlyn's advice on personal voice mail.

## 2024-03-30 ENCOUNTER — Ambulatory Visit

## 2024-03-31 DIAGNOSIS — M810 Age-related osteoporosis without current pathological fracture: Secondary | ICD-10-CM | POA: Diagnosis not present

## 2024-03-31 DIAGNOSIS — I5032 Chronic diastolic (congestive) heart failure: Secondary | ICD-10-CM | POA: Diagnosis not present

## 2024-03-31 DIAGNOSIS — E785 Hyperlipidemia, unspecified: Secondary | ICD-10-CM | POA: Diagnosis not present

## 2024-04-02 ENCOUNTER — Encounter: Payer: Self-pay | Admitting: Physical Therapy

## 2024-04-02 ENCOUNTER — Ambulatory Visit: Admitting: Physical Therapy

## 2024-04-02 DIAGNOSIS — R2681 Unsteadiness on feet: Secondary | ICD-10-CM

## 2024-04-02 DIAGNOSIS — M6281 Muscle weakness (generalized): Secondary | ICD-10-CM

## 2024-04-02 DIAGNOSIS — R2689 Other abnormalities of gait and mobility: Secondary | ICD-10-CM | POA: Diagnosis not present

## 2024-04-02 NOTE — Therapy (Signed)
 OUTPATIENT PHYSICAL THERAPY NEURO TREATMENT NOTE/DISCHARGE SUMMARY   Patient Name: Michelle Wilcox MRN: [id number] DOB:December 09, 1945, 78 y.o., female Today's Date: 04/02/2024  PCP: Aisha Harvey, MD REFERRING PROVIDER: Christobal Guadalajara, MD    PHYSICAL THERAPY DISCHARGE SUMMARY  Visits from Start of Care: 39  Current functional level related to goals / functional outcomes: Pt has met all LTGs-see below   Remaining deficits: Endurance, high level balance (improving)   Education / Equipment: Educated in HEP progressions   Patient agrees to discharge. Patient goals were met. Patient is being discharged due to meeting the stated rehab goals.     END OF SESSION:  PT End of Session - 04/02/24 1449     Visit Number 31    Number of Visits 31    Date for PT Re-Evaluation 04/10/24    Authorization Type [employer] Medicare    Progress Note Due on Visit --   PN done at visit 27   PT Start Time 1450    PT Stop Time 1528    PT Time Calculation (min) 38 min    Equipment Utilized During Treatment Gait belt    Activity Tolerance Patient tolerated treatment well    Behavior During Therapy WFL for tasks assessed/performed                      Past Medical History:  Diagnosis Date   Anxiety    hx of   Asthma    at times   CHF (congestive heart failure) (HCC)    Depression    hx of   Dyspnea    Giant cell arteritis (HCC)    Gouty arthropathy    Hemolytic anemia (HCC)    Hyperlipidemia    Hypertension    Irregular heart beat    extra beat   Migraines    Polymyalgia (HCC)    Polymyalgia rheumatica (HCC)    Temporal arteritis (HCC)    Past Surgical History:  Procedure Laterality Date   BARTHOLIN GLAND CYST EXCISION     non ca   BREAST BIOPSY Right    CARDIOVERSION N/A 11/05/2023   Procedure: CARDIOVERSION;  Surgeon: Sheena Pugh, DO;  Location: [facility];  Service: Cardiovascular;  Laterality: N/A;   CARDIOVERSION N/A 12/19/2023   Procedure:  CARDIOVERSION;  Surgeon: Santo Stanly LABOR, MD;  Location: [facility];  Service: Cardiovascular;  Laterality: N/A;   EYE MUSCLE SURGERY     as a child 34 yrs old   KNEE SURGERY  2004   rt knee   LAPAROSCOPIC SPLENECTOMY N/A 06/18/2018   Procedure: LAPAROSCOPIC SPLENECTOMY ERAS PATHWAY;  Surgeon: Mikell Katz, MD;  Location: [facility];  Service: General;  Laterality: N/A;   RIGHT/LEFT HEART CATH AND CORONARY ANGIOGRAPHY N/A 03/04/2024   Procedure: RIGHT/LEFT HEART CATH AND CORONARY ANGIOGRAPHY;  Surgeon: Anner Alm ORN, MD;  Location: [facility] CV [facility];  Service: Cardiovascular;  Laterality: N/A;   toncil     TONSILLECTOMY     as a child   TRANSESOPHAGEAL ECHOCARDIOGRAM (CATH LAB) N/A 11/05/2023   Procedure: TRANSESOPHAGEAL ECHOCARDIOGRAM;  Surgeon: Sheena Pugh, DO;  Location: [facility];  Service: Cardiovascular;  Laterality: N/A;   TRANSESOPHAGEAL ECHOCARDIOGRAM (CATH LAB) N/A 12/19/2023   Procedure: TRANSESOPHAGEAL ECHOCARDIOGRAM;  Surgeon: Santo Stanly LABOR, MD;  Location: [facility];  Service: Cardiovascular;  Laterality: N/A;   Patient Active Problem List   Diagnosis Date Noted   Acute on chronic congestive heart failure (HCC) 12/24/2023  Acute on chronic HFrEF (heart failure with reduced ejection fraction) (HCC) 12/21/2023   Atrial fibrillation with rapid ventricular response (HCC) 12/21/2023   Atrial fibrillation, rapid (HCC) 12/21/2023   Pericardial effusion 11/07/2023   Atrial fibrillation with RVR (HCC) 11/05/2023   Borderline abnormal TFTs 11/05/2023   Macrocytosis 11/05/2023   Atrial fibrillation (HCC) 11/04/2023   PMR (polymyalgia rheumatica) (HCC) 11/04/2023   Primary localized osteoarthrosis of multiple sites 06/15/2022   Diastolic dysfunction without heart failure 03/05/2018   DOE (dyspnea on exertion) 03/05/2018   Preop cardiovascular exam 03/05/2018   Mild reactive airways disease 08/04/2017   Volume overload 08/03/2017    Hypokalemia 08/03/2017   Essential hypertension 08/03/2017   Hyperlipidemia 08/03/2017   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   Hypersensitivity reaction 11/20/2016   Drusen (degenerative) of retina, bilateral 05/22/2016   Nuclear cataract 05/22/2016   Retinal hemorrhage of left eye 05/22/2016   Temporal arteritis (HCC) 05/22/2016   Hemolytic anemia (HCC) 03/19/2016   Breast mass, right 07/01/2012    ONSET DATE: 11/04/23  REFERRING DIAG: I48.91 (ICD-10-CM) - Atrial fibrillation with RVR (HCC)  THERAPY DIAG:  Other abnormalities of gait and mobility  Unsteadiness on feet  Muscle weakness (generalized)  Rationale for Evaluation and Treatment: Rehabilitation  SUBJECTIVE:                                                                                                                                                                                             SUBJECTIVE STATEMENT: Week has been okay.  Trying to get used to the new schedule.   Pt accompanied by: self PERTINENT HISTORY: 12/31/2023:  Pt underwent attempt at cardioversion for a-fib on 12/19/23, which did not last >24 hrs; was hospitalized after ED visit for SOB, CHF (*New orders received for PT resume and treat).  Anxiety, asthma, CHF, depression, gouty arthropathy, anemia, HLD, HTN, migraines, polymyalgia rheumatica, temporal arteritis, R knee surgery   PAIN:  Are you having pain? Yes: NPRS scale: R wrist is a little sore Pain location: R wrist Pain description: sore Aggravating factors: heart cath Relieving factors: nothing  PRECAUTIONS: Fall; per Cardiac NP's recommendation: goal resting HR is <110bpm. goal heart rate is <135 bpm with exertion.  12/31/2023:  Have HR monitor (will wear for 14 days).  Monitor BP  WEIGHT BEARING RESTRICTIONS: No  FALLS: Has patient fallen in last 6 months? Yes. Number of falls 1  LIVING ENVIRONMENT: Lives with: lives with their spouse who has stage 4 stomach CA (pt reports  that she is having to physically assist him some) Lives in: Other townhouse  Stairs: 2 stories, sleeps on 1st  floor in recliner  Has following equipment at home: Walker - 2 wheeled, cane  PLOF: Independent; currently getting assistance with meals   PATIENT GOALS: improve fatigue   OBJECTIVE:    TODAY'S TREATMENT: 04/02/2024 Activity Comments  Vitals:  94%, 83 bpm, 132/84   FTSTS:  14.63 sec Improved from 18 sec  6 MWT:  1100 ft Vitals after:  92%, 120>85 bpm  Improved from 900 ft with cane  FGA:  20/30 Improved from 11/30             [facility] PT Assessment - 04/02/24 1500       Functional Gait  Assessment   Gait assessed  Yes    Gait Level Surface Walks 20 ft in less than 7 sec but greater than 5.5 sec, uses assistive device, slower speed, mild gait deviations, or deviates 6-10 in outside of the 12 in walkway width.    Change in Gait Speed Able to change speed, demonstrates mild gait deviations, deviates 6-10 in outside of the 12 in walkway width, or no gait deviations, unable to achieve a major change in velocity, or uses a change in velocity, or uses an assistive device.    Gait with Horizontal Head Turns Performs head turns smoothly with slight change in gait velocity (eg, minor disruption to smooth gait path), deviates 6-10 in outside 12 in walkway width, or uses an assistive device.    Gait with Vertical Head Turns Performs head turns with no change in gait. Deviates no more than 6 in outside 12 in walkway width.    Gait and Pivot Turn Pivot turns safely within 3 sec and stops quickly with no loss of balance.    Step Over Obstacle Is able to step over one shoe box (4.5 in total height) but must slow down and adjust steps to clear box safely. May require verbal cueing.    Gait with Narrow Base of Support Is able to ambulate for 10 steps heel to toe with no staggering.    Gait with Eyes Closed Cannot walk 20 ft without assistance, severe gait deviations or imbalance, deviates  greater than 15 in outside 12 in walkway width or will not attempt task.    Ambulating Backwards Walks 20 ft, uses assistive device, slower speed, mild gait deviations, deviates 6-10 in outside 12 in walkway width.   27   Steps Alternating feet, must use rail.    Total Score 20            PATIENT EDUCATION: Education details: Discussed POC and progress towards goals; discussed importance of consistent HEP performance post d/c and pt has gotten leg weights to add to HEP Person educated: Patient Education method: Explanation, Demonstration, Tactile cues, Verbal cues, and Handouts Education comprehension: verbalized understanding, returned demonstration, verbal cues required, and tactile cues required    HOME EXERCISE PROGRAM Access Code: [email or web address] URL: [email or web address] Date: 03/09/2024 Prepared by: [facility] - Outpatient  Rehab - [facility]  Program Notes you can get a pair of adjustable ankle weights ranging from 1-5 lbs from [email or web address]   Exercises - Sit to Stand with Arms Crossed  - 1 x daily - 5 x weekly - 2 sets - 10 reps - Standing Hamstring Curl with Chair Support  - 1 x daily - 7 x weekly - 3 sets - 10-15 reps - Standing March with Counter Support  - 1 x daily - 7 x weekly - 2-3 sets - 1 min hold - Side Stepping with Counter Support  -  1 x daily - 5 x weekly - 2 sets - 1 min hold - Forward Step Up  - 1 x daily - 5 x weekly - 2-3 sets - 30 sec hold - Walking with Eyes Closed and Counter Support  - 1 x daily - 5 x weekly - 2 sets - 5 reps - Standing Near Stance in Corner with Eyes Closed  - 1 x daily - 5 x weekly - 3 sets - 30 sec hold    ______________________________________________ Note: Objective measures were completed at Evaluation unless otherwise noted.  Vitals: 96% spO2, 97 bpm (102 bpm taken manually),  115/85 mmHg  DIAGNOSTIC FINDINGS: 11/05/23 echo TEE: Left Atrium: Left atrial size was severely dilated. A left atrial/left  atrial  appendage thrombus was detected.   COGNITION: Overall cognitive status: Within functional limits for tasks assessed   SENSATION: WFL  POSTURE: rounded shoulders and forward head  LOWER EXTREMITY MMT:   MMT (in sitting) Right Eval Left Eval  Hip flexion 4+ 4+  Hip extension    Hip abduction 4+ 4+  Hip adduction 4+ 4+  Hip internal rotation    Hip external rotation    Knee flexion 4+ 4+  Knee extension 4+ 4  Ankle dorsiflexion 4+ 4+  Ankle plantarflexion 4 4  Ankle inversion    Ankle eversion    (Blank rows = not tested)  GAIT: Gait pattern: slow gait speed, pushing walker too far forward Assistive device utilized: Walker - 2 wheeled Level of assistance: SBA  FUNCTIONAL TESTS:  5xSTS: 26.23 sec pushing off knees and audible R knee crepitus  Pre: 97% spO2, 89bpm  Post: 95% spO2, 102bpm   22M walk test: 17.53 sec with RW (1.87 ft/sec)                                                                                                                 __________________________________________________________________________________ GOALS: Goals reviewed with patient? Yes  SHORT TERM GOALS: Target date: 12/05/2023  Patient to be independent with initial HEP. Baseline: HEP initiated Goal status: MET  LONG TERM GOALS: Target date: 03/12/2024  >04/10/2024  Patient to be independent with advanced HEP. Baseline: Initiated and may need to progress to include high level balance, 03/03/2024  Goal status: MET, 04/02/2024  Patient to demonstrate gait speed of at least 2.0 ft/sec in order to improve access to community.  Baseline: 1.87 ft/sec 12/31/2023; 12.09 sec with RW (2.7 ft/sec) 01/30/24 Goal status: MET  01/30/24  Patient to report tolerance for grocery trip without fatigue limiting.    Baseline: unable; She has been able to tolerate short trips lasting ~10 minutes with her walker but recently went to [employer] at tried a long trip and it felt like it was too much. 01/30/24;  able to get through a few trips and fatigues by the time she gets home 03/03/2024 Goal status: MET,03/03/2024  6 min walk test to improve to >600 ft with device as needed Baseline: 437 ft>710 ft in 6 MWT 12/17/23 Goal status:  MET 12/17/2023  Patient to demonstrate 5xSTS test in <15 sec in order to decrease risk of falls.  Baseline: 26.23 sec pushing off knees and audible R knee crepitus; 45.41 seconds with arms crossed at chest 5/13; 15.9 sec without UEs 01/30/24; 18.38 sec (end of session 03/03/2024)>14.63 sec 04/02/2024 Goal status: MET, 04/02/2024  6 min walk test to improve to at least 1,000 ft with device as needed Baseline: 838 feet with RW 01/30/24; 900 ft with [facility] 03/03/2024> 1100 ft with [facility] 04/02/2024 Goal status: MET 04/02/2024  FGA to improve to a least 16/30 for decreased fall risk. Baseline:  11/30 no cane 03/03/2024> 20/30 04/02/2024 Goal status: MET, 04/02/2024     ASSESSMENT:  CLINICAL IMPRESSION: Pt presents today with no new complaints. Skilled PT session focused on assessing LTGs.  She has improved overall gait, functional strength, and balance as well as endurance.  She has improve to 1100 with cane (compared to 437 ft with RW); she has improved gait velocity to 2.7 ft/sec (compared to 1.87 ft/sec); she has improved FGA score to 20/30, improved from 11/30.  She has returned to work and she reports continueing to do her HEP (has gotten ankle weights and verbalizes plans to add 4# for HEP).  She has met all LTGs and is appropriate for discharge from PT at this time.  OBJECTIVE IMPAIRMENTS: Abnormal gait, cardiopulmonary status limiting activity, decreased activity tolerance, decreased balance, difficulty walking, decreased strength, and pain.   PLAN:  PT FREQUENCY: 1x/wk   PT DURATION: 4 weeks  PLANNED INTERVENTIONS: [id number]- PT Re-evaluation, 97110-Therapeutic exercises, 97530- Therapeutic activity, [id number]- Neuromuscular re-education, 97535- Self Care, [id number]- Manual therapy,  [id number]- Gait training, [phone]- Canalith repositioning, [phone]- Aquatic Therapy, Patient/Family education, Balance training, Stair training, Taping, Dry Needling, Vestibular training, DME instructions, Cryotherapy, and Moist heat  PLAN FOR NEXT SESSION: Discharge PT this visit.    Greig Anon, PT 04/02/24 3:44 PM Phone: [phone] Fax: [phone]  [facility] at [facility] [address] [place], [address] [place], [place] [address] Phone # [phone] Fax # [phone]

## 2024-04-05 DIAGNOSIS — S81811A Laceration without foreign body, right lower leg, initial encounter: Secondary | ICD-10-CM | POA: Diagnosis not present

## 2024-04-06 ENCOUNTER — Ambulatory Visit: Admitting: Physical Therapy

## 2024-04-08 DIAGNOSIS — I4891 Unspecified atrial fibrillation: Secondary | ICD-10-CM | POA: Diagnosis not present

## 2024-04-08 DIAGNOSIS — I272 Pulmonary hypertension, unspecified: Secondary | ICD-10-CM | POA: Diagnosis not present

## 2024-04-08 DIAGNOSIS — M810 Age-related osteoporosis without current pathological fracture: Secondary | ICD-10-CM | POA: Diagnosis not present

## 2024-04-08 DIAGNOSIS — F4310 Post-traumatic stress disorder, unspecified: Secondary | ICD-10-CM | POA: Diagnosis not present

## 2024-04-08 DIAGNOSIS — E785 Hyperlipidemia, unspecified: Secondary | ICD-10-CM | POA: Diagnosis not present

## 2024-04-09 ENCOUNTER — Ambulatory Visit

## 2024-04-09 ENCOUNTER — Ambulatory Visit: Admitting: Physical Therapy

## 2024-04-09 DIAGNOSIS — L905 Scar conditions and fibrosis of skin: Secondary | ICD-10-CM | POA: Diagnosis not present

## 2024-04-09 DIAGNOSIS — D485 Neoplasm of uncertain behavior of skin: Secondary | ICD-10-CM | POA: Diagnosis not present

## 2024-04-13 ENCOUNTER — Ambulatory Visit: Admitting: Cardiology

## 2024-04-14 NOTE — Therapy (Signed)
 OUTPATIENT PHYSICAL THERAPY THORACOLUMBAR EVALUATION   Patient Name: Michelle Wilcox MRN: [id number] DOB:1945-12-26, 78 y.o., female Today's Date: 04/15/2024  END OF SESSION:  PT End of Session - 04/15/24 0850     Visit Number 1    Date for PT Re-Evaluation 07/08/24    Authorization Type BCBS Medicare    PT Start Time 0850    PT Stop Time 0931    PT Time Calculation (min) 41 min    Activity Tolerance Patient tolerated treatment well    Behavior During Therapy WFL for tasks assessed/performed          Past Medical History:  Diagnosis Date   Anxiety    hx of   Asthma    at times   CHF (congestive heart failure) (HCC)    Depression    hx of   Dyspnea    Giant cell arteritis (HCC)    Gouty arthropathy    Hemolytic anemia (HCC)    Hyperlipidemia    Hypertension    Irregular heart beat    extra beat   Migraines    Polymyalgia (HCC)    Polymyalgia rheumatica (HCC)    Temporal arteritis (HCC)    Past Surgical History:  Procedure Laterality Date   BARTHOLIN GLAND CYST EXCISION     non ca   BREAST BIOPSY Right    CARDIOVERSION N/A 11/05/2023   Procedure: CARDIOVERSION;  Surgeon: Sheena Pugh, DO;  Location: [facility];  Service: Cardiovascular;  Laterality: N/A;   CARDIOVERSION N/A 12/19/2023   Procedure: CARDIOVERSION;  Surgeon: Santo Stanly LABOR, MD;  Location: [facility];  Service: Cardiovascular;  Laterality: N/A;   EYE MUSCLE SURGERY     as a child 7 yrs old   KNEE SURGERY  2004   rt knee   LAPAROSCOPIC SPLENECTOMY N/A 06/18/2018   Procedure: LAPAROSCOPIC SPLENECTOMY ERAS PATHWAY;  Surgeon: Mikell Katz, MD;  Location: [facility];  Service: General;  Laterality: N/A;   RIGHT/LEFT HEART CATH AND CORONARY ANGIOGRAPHY N/A 03/04/2024   Procedure: RIGHT/LEFT HEART CATH AND CORONARY ANGIOGRAPHY;  Surgeon: Anner Alm ORN, MD;  Location: [facility];  Service: Cardiovascular;  Laterality: N/A;   toncil     TONSILLECTOMY     as a  child   TRANSESOPHAGEAL ECHOCARDIOGRAM (CATH LAB) N/A 11/05/2023   Procedure: TRANSESOPHAGEAL ECHOCARDIOGRAM;  Surgeon: Sheena Pugh, DO;  Location: [facility];  Service: Cardiovascular;  Laterality: N/A;   TRANSESOPHAGEAL ECHOCARDIOGRAM (CATH LAB) N/A 12/19/2023   Procedure: TRANSESOPHAGEAL ECHOCARDIOGRAM;  Surgeon: Santo Stanly LABOR, MD;  Location: [facility];  Service: Cardiovascular;  Laterality: N/A;   Patient Active Problem List   Diagnosis Date Noted   Acute on chronic congestive heart failure (HCC) 12/24/2023   Acute on chronic HFrEF (heart failure with reduced ejection fraction) (HCC) 12/21/2023   Atrial fibrillation with rapid ventricular response (HCC) 12/21/2023   Atrial fibrillation, rapid (HCC) 12/21/2023   Pericardial effusion 11/07/2023   Atrial fibrillation with RVR (HCC) 11/05/2023   Borderline abnormal TFTs 11/05/2023   Macrocytosis 11/05/2023   Atrial fibrillation (HCC) 11/04/2023   PMR (polymyalgia rheumatica) (HCC) 11/04/2023   Primary localized osteoarthrosis of multiple sites 06/15/2022   Diastolic dysfunction without heart failure 03/05/2018   DOE (dyspnea on exertion) 03/05/2018   Preop cardiovascular exam 03/05/2018   Mild reactive airways disease 08/04/2017   Volume overload 08/03/2017   Hypokalemia 08/03/2017   Essential hypertension 08/03/2017   Hyperlipidemia 08/03/2017   Autoimmune hemolytic anemia (  HCC) 11/20/2016   Hypersensitivity reaction 11/20/2016   Drusen (degenerative) of retina, bilateral 05/22/2016   Nuclear cataract 05/22/2016   Retinal hemorrhage of left eye 05/22/2016   Temporal arteritis (HCC) 05/22/2016   Hemolytic anemia (HCC) 03/19/2016   Breast mass, right 07/01/2012    PCP: Aisha Harvey, MD'  REFERRING PROVIDER: Aisha Harvey, MD   REFERRING DIAG: M81.0 (ICD-10-CM) - Age-related osteoporosis without current pathological fracture   Rationale for Evaluation and Treatment: Rehabilitation  THERAPY DIAG:   Other abnormalities of gait and mobility  Muscle weakness (generalized)  Unsteadiness on feet  ONSET DATE: 11/04/23  SUBJECTIVE:                                                                                                                                                                                           SUBJECTIVE STATEMENT: Here for OP ,She is still in Afib all the time (see history below). They want her to have an ablation now, before the end of the year. Seeing cardiologist tomorrow. She has been back to work since Aug 2 at [employer]. 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 Working 32 hours. Would like 1x/wk. She is challenged with back to back tours as she is talking. She still feels unsteady at times. It depends on the day. Standing is limited to 10 minutes due to fatigue.   PERTINENT HISTORY:   Vertigo, 12/31/2023:  Pt underwent attempt at cardioversion for a-fib on 12/19/23, which did not last >24 hrs; was hospitalized after ED visit for SOB, CHF.  Anxiety, asthma, CHF, depression, gouty arthropathy, anemia, HLD, HTN, migraines, polymyalgia rheumatica, temporal arteritis, R knee surgery   PAIN:  Are you having pain? No  PRECAUTIONS: None  RED FLAGS: None   WEIGHT BEARING RESTRICTIONS: No  FALLS:  Has patient fallen in last 6 months? No  LIVING ENVIRONMENT: Lives with: lives with their spouse who have stage 4 stomach CA (pt reports that she is having to physically assist him some) Lives in: Other townhouse  Stairs: 2 stories, sleeps on 1st floor in recliner  Has following equipment at home: Environmental consultant - 2 wheeled, cane  OCCUPATION: works 32 hours per week  PLOF: Independent with basic ADLs, Independent with household mobility without device, Independent with community mobility with device, and Vocation/Vocational requirements: walking and talking  PATIENT GOALS: Help to do with osteoporosis and balance still  NEXT MD VISIT:  unsure  OBJECTIVE:  Note: Objective measures were completed at Evaluation unless otherwise noted.  DIAGNOSTIC FINDINGS:  None recent  PATIENT SURVEYS:  PSFS: THE PATIENT SPECIFIC FUNCTIONAL SCALE  Place score of 0-10 (  0 = unable to perform activity and 10 = able to perform activity at the same level as before injury or problem)  Activity Date: 04/15/24    Standing for conversation or tasks unassisted   2    2.endurance with walking   4    3. Managing grocery store runs (including unloading and putting things away)   3    4. Picking up 10# or more and carry   2    Total Score 2.75      Total Score = Sum of activity scores/number of activities  Minimally Detectable Change: 3 points (for single activity); 2 points (for average score)  [facility] (nd). The Patient Specific Functional Scale . Retrieved from [email or web address]   COGNITION: Overall cognitive status: Within functional limits for tasks assessed     SENSATION: WFL   LUMBAR ROM: WNL for tasks assessed   LOWER EXTREMITY ROM:    WNL for tasks assessed   LOWER EXTREMITY MMT:  4 to 5/5 B tested in sitting    FUNCTIONAL TESTS:  5 times sit to stand: 17.01 sec 6 minute walk test: 923 ft RPE 5/10 vitals PR 94 bpm, O2 sats 90% Functional gait assessment: TBD  Interpretation of scores: Non-Specific Older Adults Cutoff Score: <=22/30 = risk of falls Parkinson's Disease Cutoff score <15/30= fall risk (Hoehn & Yahr 1-4)  Minimally Clinically Important Difference (MCID)  Stroke (acute, subacute, and chronic) = MDC: 4.2 points Vestibular (acute) = MDC: 6 points Community Dwelling Older Adults =  MCID: 4 points Parkinson's Disease  =  MDC: 4.3 points  ([employer]y [employer] (nd). Functional Gait Assessment. Retrieved from  https://www.neuropt.org/docs/default-source/cpgs/core-outcome-measures/function-gait-assessment-pocket-guide-proof9-(2).pdf?sfvrsn=b38f35043_0.)  GAIT: Safe gait patten with walking stick  TREATMENT DATE:                                                                                                                                04/15/24 See pt ed and HEP  Adjusted walking stick to correct height  PATIENT EDUCATION:  Education details: PT eval findings, anticipated POC, and HEP review   Person educated: Patient Education method: verbally discussed Education comprehension: verbalized understanding  HOME EXERCISE PROGRAM: Access Code: [email or web address] URL: [email or web address] Date: 04/15/2024 Prepared by: Mliss (previous HEP from recent episode)  Exercises - Sit to Stand with Arms Crossed  - 1 x daily - 5 x weekly - 2 sets - 10 reps - Standing Hamstring Curl with Chair Support  - 1 x daily - 7 x weekly - 3 sets - 10-15 reps - Standing March with Counter Support  - 1 x daily - 7 x weekly - 2-3 sets - 1 min hold - Side Stepping with Counter Support  - 1 x daily - 5 x weekly - 2 sets - 1 min hold - Forward Step Up  - 1 x daily - 5 x weekly - 2-3 sets - 30 sec hold - Walking with Eyes Closed  and Counter Support  - 1 x daily - 5 x weekly - 2 sets - 5 reps - Standing Near Stance in Corner with Eyes Closed  - 1 x daily - 5 x weekly - 3 sets - 30 sec hold  ASSESSMENT:  CLINICAL IMPRESSION: Patient is a 78 y.o. female who was seen today for physical therapy evaluation and treatment for osteoporosis and fall prevention . She was recently discharged from neuro PT due to complications from Afib. She has recently returned to work and has deficits with endurance with standing and walking  affecting work and grocery shopping. She also reports deficits with carrying over 10#. She denies pain. She continues to report balance deficits, but states her balance is much improved and it depends on  the day. She will benefit from skilled PT to address these deficits and those listed below. Due to her RTW she will only be available 1x/wk.  OBJECTIVE IMPAIRMENTS: cardiopulmonary status limiting activity, decreased activity tolerance, decreased balance, difficulty walking, and decreased strength.   ACTIVITY LIMITATIONS: carrying, lifting, standing, and locomotion level  PARTICIPATION LIMITATIONS: laundry, shopping, and occupation  PERSONAL FACTORS: Age, Fitness, Past/current experiences, Time since onset of injury/illness/exacerbation, and 3+ comorbidities: OP, Afib, vertigo are also affecting patient's functional outcome.   REHAB POTENTIAL: Good  CLINICAL DECISION MAKING: Evolving/moderate complexity  EVALUATION COMPLEXITY: Moderate   GOALS: Goals reviewed with patient? Yes  SHORT TERM GOALS: Target date: 05/27/24  Ind with initial HEP Baseline: Goal status: INITIAL  2.  Decreased TUG by 2-3 seconds showing decreased risk for falls. Baseline: 13.77 sec Goal status: INITIAL  3.  Decreased 5XSTS by 2-3 seconds showing functional improvement in strength Baseline:  Goal status: INITIAL   LONG TERM GOALS: Target date: 07/08/24  Ind with advanced HEP Baseline:  Goal status: INITIAL  2.  Improved average PSFS by 2 points showing functional improvement Baseline: 2.75 Goal status: INITIAL  3.  Patient to report improved endurance with grocery shopping by 50%  Baseline:  Goal status: INITIAL  4.  Improved to 1,100 ft or more with device as needed Baseline: 923 ft Goal status: INITIAL  5.  FGA to improve at least 4 points demonstrating decreased risk for falls Baseline: TBD Goal status: INITIAL  6.  Able to carry >= 10# x 25 ft showing improved functional UE strength Baseline:  Goal status: INITIAL  7. Improved standing tolerance to > 15-20 min without AD Baseline:  Goal status: INITIAL  PLAN:  PT FREQUENCY: 1x/week  PT DURATION: 12 weeks  PLANNED  INTERVENTIONS: 97164- PT Re-evaluation, 97110-Therapeutic exercises, 97530- Therapeutic activity, [id number]- Neuromuscular re-education, 97535- Self Care, [id number]- Manual therapy, [phone]- Gait training, Patient/Family education, Balance training, and Stair training.  PLAN FOR NEXT SESSION: complete FGA, Review and progress HEP adding UE strengthening (carries), gait endurance, balance, functional LE and core strength for osteoporosis/balance    Mliss Cummins, PT 04/15/24 11:11 AM

## 2024-04-15 ENCOUNTER — Encounter: Payer: Self-pay | Admitting: Physical Therapy

## 2024-04-15 ENCOUNTER — Other Ambulatory Visit: Payer: Self-pay

## 2024-04-15 ENCOUNTER — Ambulatory Visit: Attending: Family Medicine | Admitting: Physical Therapy

## 2024-04-15 DIAGNOSIS — R2689 Other abnormalities of gait and mobility: Secondary | ICD-10-CM | POA: Diagnosis not present

## 2024-04-15 DIAGNOSIS — M6281 Muscle weakness (generalized): Secondary | ICD-10-CM | POA: Diagnosis not present

## 2024-04-15 DIAGNOSIS — R2681 Unsteadiness on feet: Secondary | ICD-10-CM | POA: Diagnosis not present

## 2024-04-15 NOTE — Progress Notes (Unsigned)
 Electrophysiology Office Note:   Date:  04/17/2024  ID:  Michelle, Wilcox 1946/05/30, MRN [id number]  Primary Cardiologist: Alm Clay, MD Electrophysiologist: Fonda Kitty, MD      History of Present Illness:   Michelle Wilcox is a 78 y.o. female with h/o AF, HTN, HLD, PMR, Giant Cell Arteritis, autoimmune hemolytic anemia s/p splenectomy on prednisone , asthma, anxiety who is being seen today for follow up management of her AF.  Patient was admitted in 10/2023 for [facility] after a cortisone shot in her knee.  During that admission, she was planned for cardioversion but TEE showed left atrial appendage thrombus.  She was started on amiodarone , eliquis , metoprolol  and digoxin . The patient presented on 12/20/23 for TEE with DCCV.  LVEF was 65% and no further thrombus noted, LA/RA severely dilated with mod-sev functional MR.  She cardioverted with shock x1 at 200j. Patient felt well postprocedure and went to [facility] shopping when she suddenly began to have palpitations and progressive shortness of breath.  She went home and did not recover prompting ER visit.  In the emergency room she was noted to be in A-fib with RVR.  She was hypoxemic requiring BiPAP.  CXR showed cardiomegaly, vascular congestion and small bilateral pleural effusions.  She was started on amiodarone , treated with IV Lasix  and digoxin . She was discharged on amiodarone . Wore a 2 week Zio which showed 100% AF burden.  Discussed the use of AI scribe software for clinical note transcription with the patient, who gave verbal consent to proceed.  History of Present Illness Michelle Wilcox is a 78 year old female with atrial fibrillation who presents for consideration of an ablation procedure. She reports Dr. Clay encouraged her to consider ablation following a heart catheterization.  She is not currently on amiodarone , as it was ineffective in maintaining normal rhythm. She remains on digoxin , which was  reduced to half a pill due to high levels. She is taking Eliquis . She recalls a past experience where she was taken to the hospital by ambulance due to breathing difficulties and low oxygen levels following a cardioversion. 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 Taking breaks during these tours helps manage her symptoms. She generally feels well except during periods of exertion, which she suspects could be due to her AF.    Review of systems complete and found to be negative unless listed in HPI.   EP Information / Studies Reviewed:    EKG is not ordered today. EKG from 02/25/24 reviewed which showed AF.      Echo 12/22/23:  1. Left ventricular ejection fraction, by estimation, is 60 to 65%. The  left ventricle has normal function. The left ventricle has no regional  wall motion abnormalities. Left ventricular diastolic parameters are  indeterminate.   2. Right ventricular systolic function is normal. The right ventricular  size is normal. Tricuspid regurgitation signal is inadequate for assessing  PA pressure.   3. Left atrial size was moderately dilated.   4. The mitral valve is normal in structure. Trivial mitral valve  regurgitation. No evidence of mitral stenosis.   5. The aortic valve is tricuspid. Aortic valve regurgitation is mild to  moderate. No aortic stenosis is present.   6. The inferior vena cava is dilated in size with >50% respiratory  variability, suggesting right atrial pressure of 8 mmHg.   Zio Monitor 01/17/24:  Patch Wear Time:  13 days and 23 hours (  2025-05-09T15:42:31-0400 to 2025-05-23T15:42:23-0400)   1 run of Ventricular Tachycardia occurred lasting 5 beats with a max rate of 138 bpm (avg 115 bpm). Atrial Fibrillation occurred continuously (100% burden), ranging from 42-161 bpm (avg of 74 bpm). Isolated VEs were occasional (1.1%, 16470), VE Couplets  were rare (<1.0%, 10), and no VE  Triplets were present. Ventricular Bigeminy and Trigeminy were present.   Physical Exam:   VS:  BP (!) 150/77   Pulse 93   Ht 5' 3 (1.6 m)   Wt 176 lb (79.8 kg)   SpO2 92%   BMI 31.18 kg/m    Wt Readings from Last 3 Encounters:  04/16/24 176 lb (79.8 kg)  03/19/24 180 lb (81.6 kg)  03/04/24 178 lb (80.7 kg)     GEN: Well nourished, well developed in no acute distress NECK: No JVD CARDIAC: Normal rate, irregular rhythm RESPIRATORY:  Clear to auscultation without rales, wheezing or rhonchi  ABDOMEN: Soft, non-distended EXTREMITIES:  No edema; No deformity   ASSESSMENT AND PLAN:     #Persistent atrial fibrillation: Had cardioversion with ERAF. Noted severe LA dilatation on TEE with mod-sev MR. Recent Zio with 100% AF burden despite amiodarone . She is symptomatic despite rate control. #Secondary hypercoagulable state due to AF: Denies bleeding issues. -Discussed treatment options today for AF including antiarrhythmic drug therapy and ablation. Discussed risks, recovery and likelihood of success with each treatment strategy. Risk, benefits, and alternatives to EP study and ablation for afib were discussed. These risks include but are not limited to stroke, bleeding, vascular damage, tamponade, perforation, damage to the esophagus, lungs, phrenic nerve and other structures, pulmonary vein stenosis, worsening renal function, coronary vasospasm and death.  Discussed potential need for repeat ablation procedures and antiarrhythmic drugs after an initial ablation. The patient understands these risk and wishes to proceed.  We will therefore proceed with catheter ablation at the next available time.  Carto, ICE, anesthesia are requested for the procedure.   We will obtain CT PV protocol prior to the procedure. - Continue Toprol  to 75 mg once daily. - Continue digoxin . - Continue Eliquis  5 mg twice daily for now.    #Hypertension -At goal today.  Recommend checking blood pressures 1-2 times  per week at home and recording the values.  Recommend bringing these recordings to the primary care physician.  Signed, Fonda Kitty, MD

## 2024-04-16 ENCOUNTER — Encounter: Payer: Self-pay | Admitting: Cardiology

## 2024-04-16 ENCOUNTER — Other Ambulatory Visit: Payer: Self-pay

## 2024-04-16 ENCOUNTER — Ambulatory Visit: Attending: Cardiology | Admitting: Cardiology

## 2024-04-16 VITALS — BP 150/77 | HR 93 | Ht 63.0 in | Wt 176.0 lb

## 2024-04-16 DIAGNOSIS — I4891 Unspecified atrial fibrillation: Secondary | ICD-10-CM

## 2024-04-16 DIAGNOSIS — D6869 Other thrombophilia: Secondary | ICD-10-CM | POA: Diagnosis not present

## 2024-04-16 DIAGNOSIS — I4819 Other persistent atrial fibrillation: Secondary | ICD-10-CM

## 2024-04-16 DIAGNOSIS — I10 Essential (primary) hypertension: Secondary | ICD-10-CM

## 2024-04-16 NOTE — Patient Instructions (Addendum)
 Medication Instructions:  Your physician recommends that you continue on your current medications as directed. Please refer to the Current Medication list given to you today.  *If you need a refill on your cardiac medications before your next appointment, please call your pharmacy*  Testing/Procedures: Ablation Your physician has recommended that you have an ablation. Catheter ablation is a medical procedure used to treat some cardiac arrhythmias (irregular heartbeats). During catheter ablation, a long, thin, flexible tube is put into a blood vessel in your groin (upper thigh), or neck. This tube is called an ablation catheter. It is then guided to your heart through the blood vessel. Radio frequency waves destroy small areas of heart tissue where abnormal heartbeats may cause an arrhythmia to start.   You are scheduled for Atrial Fibrillation Ablation on Friday, November 7th  with Dr. Sidra Kitty.Please arrive at the Main Entrance A at [facility]: [address] [place], [place] [address] at 10:30am    What To Expect:  Labs: you will need to have lab work drawn th week of October 20th. Please go to any [facility] location to have these drawn - no appointment is needed. You will receive procedure instructions either through MyChart or in the mail about 4 weeks prior to your procedure.  After your procedure we recommend no driving for 3 days, no lifting over 10 lbs for 5 days, and no work or strenuous activity for 7 days.  Please contact our office at [phone] if you have any questions.    Follow-Up: We will contact you to schedule your post-procedure appointments.

## 2024-04-23 DIAGNOSIS — M1991 Primary osteoarthritis, unspecified site: Secondary | ICD-10-CM | POA: Diagnosis not present

## 2024-04-23 DIAGNOSIS — Z79899 Other long term (current) drug therapy: Secondary | ICD-10-CM | POA: Diagnosis not present

## 2024-04-23 DIAGNOSIS — D591 Autoimmune hemolytic anemia, unspecified: Secondary | ICD-10-CM | POA: Diagnosis not present

## 2024-04-23 DIAGNOSIS — M353 Polymyalgia rheumatica: Secondary | ICD-10-CM | POA: Diagnosis not present

## 2024-04-23 DIAGNOSIS — M315 Giant cell arteritis with polymyalgia rheumatica: Secondary | ICD-10-CM | POA: Diagnosis not present

## 2024-04-28 NOTE — Therapy (Signed)
 OUTPATIENT PHYSICAL THERAPY THORACOLUMBAR TREATMENT   Patient Name: Michelle Wilcox MRN: [id number] DOB:12-Nov-1945, 78 y.o., female Today's Date: 04/28/2024  END OF SESSION:    Past Medical History:  Diagnosis Date   Anxiety    hx of   Asthma    at times   CHF (congestive heart failure) (HCC)    Depression    hx of   Dyspnea    Giant cell arteritis (HCC)    Gouty arthropathy    Hemolytic anemia (HCC)    Hyperlipidemia    Hypertension    Irregular heart beat    extra beat   Migraines    Polymyalgia (HCC)    Polymyalgia rheumatica (HCC)    Temporal arteritis (HCC)    Past Surgical History:  Procedure Laterality Date   BARTHOLIN GLAND CYST EXCISION     non ca   BREAST BIOPSY Right    CARDIOVERSION N/A 11/05/2023   Procedure: CARDIOVERSION;  Surgeon: Sheena Pugh, DO;  Location: [facility];  Service: Cardiovascular;  Laterality: N/A;   CARDIOVERSION N/A 12/19/2023   Procedure: CARDIOVERSION;  Surgeon: Santo Stanly LABOR, MD;  Location: [facility];  Service: Cardiovascular;  Laterality: N/A;   EYE MUSCLE SURGERY     as a child 42 yrs old   KNEE SURGERY  2004   rt knee   LAPAROSCOPIC SPLENECTOMY N/A 06/18/2018   Procedure: LAPAROSCOPIC SPLENECTOMY ERAS PATHWAY;  Surgeon: Mikell Katz, MD;  Location: [facility];  Service: General;  Laterality: N/A;   RIGHT/LEFT HEART CATH AND CORONARY ANGIOGRAPHY N/A 03/04/2024   Procedure: RIGHT/LEFT HEART CATH AND CORONARY ANGIOGRAPHY;  Surgeon: Anner Alm ORN, MD;  Location: [facility];  Service: Cardiovascular;  Laterality: N/A;   toncil     TONSILLECTOMY     as a child   TRANSESOPHAGEAL ECHOCARDIOGRAM (CATH LAB) N/A 11/05/2023   Procedure: TRANSESOPHAGEAL ECHOCARDIOGRAM;  Surgeon: Sheena Pugh, DO;  Location: [facility];  Service: Cardiovascular;  Laterality: N/A;   TRANSESOPHAGEAL ECHOCARDIOGRAM (CATH LAB) N/A 12/19/2023   Procedure: TRANSESOPHAGEAL ECHOCARDIOGRAM;  Surgeon: Santo Stanly LABOR, MD;  Location: [facility];  Service: Cardiovascular;  Laterality: N/A;   Patient Active Problem List   Diagnosis Date Noted   Acute on chronic congestive heart failure (HCC) 12/24/2023   Acute on chronic HFrEF (heart failure with reduced ejection fraction) (HCC) 12/21/2023   Atrial fibrillation with rapid ventricular response (HCC) 12/21/2023   Atrial fibrillation, rapid (HCC) 12/21/2023   Pericardial effusion 11/07/2023   Atrial fibrillation with RVR (HCC) 11/05/2023   Borderline abnormal TFTs 11/05/2023   Macrocytosis 11/05/2023   Atrial fibrillation (HCC) 11/04/2023   PMR (polymyalgia rheumatica) (HCC) 11/04/2023   Primary localized osteoarthrosis of multiple sites 06/15/2022   Diastolic dysfunction without heart failure 03/05/2018   DOE (dyspnea on exertion) 03/05/2018   Preop cardiovascular exam 03/05/2018   Mild reactive airways disease 08/04/2017   Volume overload 08/03/2017   Hypokalemia 08/03/2017   Essential hypertension 08/03/2017   Hyperlipidemia 08/03/2017   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   Hypersensitivity reaction 11/20/2016   Drusen (degenerative) of retina, bilateral 05/22/2016   Nuclear cataract 05/22/2016   Retinal hemorrhage of left eye 05/22/2016   Temporal arteritis (HCC) 05/22/2016   Hemolytic anemia (HCC) 03/19/2016   Breast mass, right 07/01/2012    PCP: Aisha Harvey, MD'  REFERRING PROVIDER: Aisha Harvey, MD   REFERRING DIAG: M81.0 (ICD-10-CM) - Age-related osteoporosis without current pathological fracture   Rationale for Evaluation and  Treatment: Rehabilitation  THERAPY DIAG:  No diagnosis found.  ONSET DATE: 11/04/23  SUBJECTIVE:                                                                                                                                                                                           SUBJECTIVE STATEMENT: ***   Eval: Here for OP ,She is still in Afib all the time (see  history below). They want her to have an ablation now, before the end of the year. Seeing cardiologist tomorrow. She has been back to work since Aug 2 at [employer]. 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 Working 32 hours. Would like 1x/wk. She is challenged with back to back tours as she is talking. She still feels unsteady at times. It depends on the day. Standing is limited to 10 minutes due to fatigue.   PERTINENT HISTORY:   Vertigo, 12/31/2023:  Pt underwent attempt at cardioversion for a-fib on 12/19/23, which did not last >24 hrs; was hospitalized after ED visit for SOB, CHF.  Anxiety, asthma, CHF, depression, gouty arthropathy, anemia, HLD, HTN, migraines, polymyalgia rheumatica, temporal arteritis, R knee surgery   PAIN:  Are you having pain? No  PRECAUTIONS: None  RED FLAGS: None   WEIGHT BEARING RESTRICTIONS: No  FALLS:  Has patient fallen in last 6 months? No  LIVING ENVIRONMENT: Lives with: lives with their spouse who have stage 4 stomach CA (pt reports that she is having to physically assist him some) Lives in: Other townhouse  Stairs: 2 stories, sleeps on 1st floor in recliner  Has following equipment at home: Environmental consultant - 2 wheeled, cane  OCCUPATION: works 32 hours per week  PLOF: Independent with basic ADLs, Independent with household mobility without device, Independent with community mobility with device, and Vocation/Vocational requirements: walking and talking  PATIENT GOALS: Help to do with osteoporosis and balance still  NEXT MD VISIT: unsure  OBJECTIVE:  Note: Objective measures were completed at Evaluation unless otherwise noted.  DIAGNOSTIC FINDINGS:  None recent  PATIENT SURVEYS:  PSFS: THE PATIENT SPECIFIC FUNCTIONAL SCALE  Place score of 0-10 (0 = unable to perform activity and 10 = able to perform activity at the same level as before injury or problem)  Activity Date: 04/15/24    Standing for conversation or tasks  unassisted   2    2.endurance with walking   4    3. Managing grocery store runs (including unloading and putting things away)   3    4. Picking up 10# or more and carry   2    Total Score 2.75  Total Score = Sum of activity scores/number of activities  Minimally Detectable Change: 3 points (for single activity); 2 points (for average score)  [facility] (nd). The Patient Specific Functional Scale . Retrieved from [email or web address]   COGNITION: Overall cognitive status: Within functional limits for tasks assessed     SENSATION: WFL   LUMBAR ROM: WNL for tasks assessed   LOWER EXTREMITY ROM:    WNL for tasks assessed   LOWER EXTREMITY MMT:  4 to 5/5 B tested in sitting    FUNCTIONAL TESTS:  5 times sit to stand: 17.01 sec 6 minute walk test: 923 ft RPE 5/10 vitals PR 94 bpm, O2 sats 90% Functional gait assessment: ***  Interpretation of scores: Non-Specific Older Adults Cutoff Score: <=22/30 = risk of falls Parkinson's Disease Cutoff score <15/30= fall risk (Hoehn & Yahr 1-4)  Minimally Clinically Important Difference (MCID)  Stroke (acute, subacute, and chronic) = MDC: 4.2 points Vestibular (acute) = MDC: 6 points Community Dwelling Older Adults =  MCID: 4 points Parkinson's Disease  =  MDC: 4.3 points  ([employer] (nd). Functional Gait Assessment. Retrieved from https://www.neuropt.org/docs/default-source/cpgs/core-outcome-measures/function-gait-assessment-pocket-guide-proof9-(2).pdf?sfvrsn=b77f35043_0.)  GAIT: Safe gait patten with walking stick  TREATMENT DATE:                                                                                                                               04/29/24 FGA Sit to stand arms crossed or wt Standing march with carry Standing HS curl Fwd step up Walking eyes closed Narrow BOS eyes closed    04/15/24 See pt ed and HEP   Adjusted walking stick to correct height  PATIENT EDUCATION:  Education details: PT eval findings, anticipated POC, and HEP review   Person educated: Patient Education method: verbally discussed Education comprehension: verbalized understanding  HOME EXERCISE PROGRAM: Access Code: [email or web address] URL: [email or web address] Date: 04/15/2024 Prepared by: Mliss (previous HEP from recent episode)  Exercises - Sit to Stand with Arms Crossed  - 1 x daily - 5 x weekly - 2 sets - 10 reps - Standing Hamstring Curl with Chair Support  - 1 x daily - 7 x weekly - 3 sets - 10-15 reps - Standing March with Counter Support  - 1 x daily - 7 x weekly - 2-3 sets - 1 min hold - Side Stepping with Counter Support  - 1 x daily - 5 x weekly - 2 sets - 1 min hold - Forward Step Up  - 1 x daily - 5 x weekly - 2-3 sets - 30 sec hold - Walking with Eyes Closed and Counter Support  - 1 x daily - 5 x weekly - 2 sets - 5 reps - Standing Near Stance in Corner with Eyes Closed  - 1 x daily - 5 x weekly - 3 sets - 30 sec hold  ASSESSMENT:  CLINICAL IMPRESSION: ***   Eval: Patient is a 78 y.o. female who was seen  today for physical therapy evaluation and treatment for osteoporosis and fall prevention . She was recently discharged from neuro PT due to complications from Afib. She has recently returned to work and has deficits with endurance with standing and walking  affecting work and grocery shopping. She also reports deficits with carrying over 10#. She denies pain. She continues to report balance deficits, but states her balance is much improved and it depends on the day. She will benefit from skilled PT to address these deficits and those listed below. Due to her RTW she will only be available 1x/wk.  OBJECTIVE IMPAIRMENTS: cardiopulmonary status limiting activity, decreased activity tolerance, decreased balance, difficulty walking, and decreased strength.   ACTIVITY LIMITATIONS: carrying, lifting,  standing, and locomotion level  PARTICIPATION LIMITATIONS: laundry, shopping, and occupation  PERSONAL FACTORS: Age, Fitness, Past/current experiences, Time since onset of injury/illness/exacerbation, and 3+ comorbidities: OP, Afib, vertigo are also affecting patient's functional outcome.   REHAB POTENTIAL: Good  CLINICAL DECISION MAKING: Evolving/moderate complexity  EVALUATION COMPLEXITY: Moderate   GOALS: Goals reviewed with patient? Yes  SHORT TERM GOALS: Target date: 05/27/24  Ind with initial HEP Baseline: Goal status: INITIAL  2.  Decreased TUG by 2-3 seconds showing decreased risk for falls. Baseline: 13.77 sec Goal status: INITIAL  3.  Decreased 5XSTS by 2-3 seconds showing functional improvement in strength Baseline:  Goal status: INITIAL   LONG TERM GOALS: Target date: 07/08/24  Ind with advanced HEP Baseline:  Goal status: INITIAL  2.  Improved average PSFS by 2 points showing functional improvement Baseline: 2.75 Goal status: INITIAL  3.  Patient to report improved endurance with grocery shopping by 50%  Baseline:  Goal status: INITIAL  4.  Improved to 1,100 ft or more with device as needed Baseline: 923 ft Goal status: INITIAL  5.  FGA to improve at least 4 points demonstrating decreased risk for falls Baseline: TBD Goal status: INITIAL  6.  Able to carry >= 10# x 25 ft showing improved functional UE strength Baseline:  Goal status: INITIAL  7. Improved standing tolerance to > 15-20 min without AD Baseline:  Goal status: INITIAL  PLAN:  PT FREQUENCY: 1x/week  PT DURATION: 12 weeks  PLANNED INTERVENTIONS: 97164- PT Re-evaluation, 97110-Therapeutic exercises, 97530- Therapeutic activity, [id number]- Neuromuscular re-education, 97535- Self Care, [id number]- Manual therapy, [phone]- Gait training, Patient/Family education, Balance training, and Stair training.  PLAN FOR NEXT SESSION: complete FGA, Review and progress HEP adding UE strengthening  (carries), gait endurance, balance, functional LE and core strength for osteoporosis/balance    Mliss Cummins, PT 04/28/24 8:09 PM

## 2024-04-29 ENCOUNTER — Encounter: Payer: Self-pay | Admitting: Physical Therapy

## 2024-04-29 ENCOUNTER — Ambulatory Visit: Attending: Family Medicine | Admitting: Physical Therapy

## 2024-04-29 DIAGNOSIS — R2681 Unsteadiness on feet: Secondary | ICD-10-CM | POA: Diagnosis not present

## 2024-04-29 DIAGNOSIS — M6281 Muscle weakness (generalized): Secondary | ICD-10-CM | POA: Insufficient documentation

## 2024-04-29 DIAGNOSIS — R2689 Other abnormalities of gait and mobility: Secondary | ICD-10-CM | POA: Diagnosis not present

## 2024-04-30 DIAGNOSIS — T148XXA Other injury of unspecified body region, initial encounter: Secondary | ICD-10-CM | POA: Diagnosis not present

## 2024-04-30 DIAGNOSIS — I4891 Unspecified atrial fibrillation: Secondary | ICD-10-CM | POA: Diagnosis not present

## 2024-04-30 DIAGNOSIS — Z6832 Body mass index (BMI) 32.0-32.9, adult: Secondary | ICD-10-CM | POA: Diagnosis not present

## 2024-05-05 ENCOUNTER — Telehealth: Payer: Self-pay | Admitting: Cardiology

## 2024-05-05 DIAGNOSIS — I4891 Unspecified atrial fibrillation: Secondary | ICD-10-CM

## 2024-05-05 DIAGNOSIS — Z79899 Other long term (current) drug therapy: Secondary | ICD-10-CM

## 2024-05-05 NOTE — Telephone Encounter (Signed)
 Patient says she scheduled lab work for 10/08 8:45 AM to have them drawn 1 month prior to ablation with Dr. Kennyth. She says last time [name] ordered labs she assured that digoxin  was included, but when labs were drawn she was informed it wasn't included in the order. She would like to verify that this was included.  Patient also mentions that whenever she has lab work they always have a hard time drawing her labs, and sometimes even have to draw it twice. If they use a butterfly needle they shouldn't have any issues. She would like to know if this is something that can be requested specifically for her.

## 2024-05-06 NOTE — Telephone Encounter (Signed)
 RN called  patient to give her Michelle Wilcox response.   Patient states the message was for Dr Kennyth.  Patient states at last visit Dr Kennyth  informed her to have labs done one month prior to ablation procedure.  He also told her have Dig level drawn at time.   Patient has Oct 8 at 8:45 am  to have procedural labs done CBC,BMP. Patient wanted to be sure dig. level had been ordered as well.   RN informed patient only CBC, BMP has been ordered. Will need to send message to Dr Kennyth and his nurse for clarification .   Office will contact prior to May 27, 2024  Patient voiced understanding

## 2024-05-07 ENCOUNTER — Encounter: Payer: Self-pay | Admitting: Physical Therapy

## 2024-05-07 ENCOUNTER — Ambulatory Visit: Admitting: Physical Therapy

## 2024-05-07 DIAGNOSIS — M6281 Muscle weakness (generalized): Secondary | ICD-10-CM

## 2024-05-07 DIAGNOSIS — R2689 Other abnormalities of gait and mobility: Secondary | ICD-10-CM | POA: Diagnosis not present

## 2024-05-07 DIAGNOSIS — R2681 Unsteadiness on feet: Secondary | ICD-10-CM | POA: Diagnosis not present

## 2024-05-07 NOTE — Therapy (Signed)
 OUTPATIENT PHYSICAL THERAPY THORACOLUMBAR TREATMENT   Patient Name: Michelle Wilcox MRN: [id number] DOB:Jan 01, 1946, 78 y.o., female Today's Date: 05/07/2024  END OF SESSION:  PT End of Session - 05/07/24 1529     Visit Number 3    Date for Recertification  07/08/24    Authorization Type BCBS Medicare    PT Start Time 1448    PT Stop Time 1528    PT Time Calculation (min) 40 min    Activity Tolerance Patient tolerated treatment well    Behavior During Therapy WFL for tasks assessed/performed            Past Medical History:  Diagnosis Date   Anxiety    hx of   Asthma    at times   CHF (congestive heart failure) (HCC)    Depression    hx of   Dyspnea    Giant cell arteritis (HCC)    Gouty arthropathy    Hemolytic anemia (HCC)    Hyperlipidemia    Hypertension    Irregular heart beat    extra beat   Migraines    Polymyalgia (HCC)    Polymyalgia rheumatica (HCC)    Temporal arteritis (HCC)    Past Surgical History:  Procedure Laterality Date   BARTHOLIN GLAND CYST EXCISION     non ca   BREAST BIOPSY Right    CARDIOVERSION N/A 11/05/2023   Procedure: CARDIOVERSION;  Surgeon: Sheena Pugh, DO;  Location: [facility];  Service: Cardiovascular;  Laterality: N/A;   CARDIOVERSION N/A 12/19/2023   Procedure: CARDIOVERSION;  Surgeon: Santo Stanly LABOR, MD;  Location: [facility];  Service: Cardiovascular;  Laterality: N/A;   EYE MUSCLE SURGERY     as a child 32 yrs old   KNEE SURGERY  2004   rt knee   LAPAROSCOPIC SPLENECTOMY N/A 06/18/2018   Procedure: LAPAROSCOPIC SPLENECTOMY ERAS PATHWAY;  Surgeon: Mikell Katz, MD;  Location: [facility];  Service: General;  Laterality: N/A;   RIGHT/LEFT HEART CATH AND CORONARY ANGIOGRAPHY N/A 03/04/2024   Procedure: RIGHT/LEFT HEART CATH AND CORONARY ANGIOGRAPHY;  Surgeon: Anner Alm ORN, MD;  Location: [facility];  Service: Cardiovascular;  Laterality: N/A;   toncil     TONSILLECTOMY     as  a child   TRANSESOPHAGEAL ECHOCARDIOGRAM (CATH LAB) N/A 11/05/2023   Procedure: TRANSESOPHAGEAL ECHOCARDIOGRAM;  Surgeon: Sheena Pugh, DO;  Location: [facility];  Service: Cardiovascular;  Laterality: N/A;   TRANSESOPHAGEAL ECHOCARDIOGRAM (CATH LAB) N/A 12/19/2023   Procedure: TRANSESOPHAGEAL ECHOCARDIOGRAM;  Surgeon: Santo Stanly LABOR, MD;  Location: [facility];  Service: Cardiovascular;  Laterality: N/A;   Patient Active Problem List   Diagnosis Date Noted   Acute on chronic congestive heart failure (HCC) 12/24/2023   Acute on chronic HFrEF (heart failure with reduced ejection fraction) (HCC) 12/21/2023   Atrial fibrillation with rapid ventricular response (HCC) 12/21/2023   Atrial fibrillation, rapid (HCC) 12/21/2023   Pericardial effusion 11/07/2023   Atrial fibrillation with RVR (HCC) 11/05/2023   Borderline abnormal TFTs 11/05/2023   Macrocytosis 11/05/2023   Atrial fibrillation (HCC) 11/04/2023   PMR (polymyalgia rheumatica) (HCC) 11/04/2023   Primary localized osteoarthrosis of multiple sites 06/15/2022   Diastolic dysfunction without heart failure 03/05/2018   DOE (dyspnea on exertion) 03/05/2018   Preop cardiovascular exam 03/05/2018   Mild reactive airways disease 08/04/2017   Volume overload 08/03/2017   Hypokalemia 08/03/2017   Essential hypertension 08/03/2017   Hyperlipidemia 08/03/2017   Autoimmune  hemolytic anemia (HCC) 11/20/2016   Hypersensitivity reaction 11/20/2016   Drusen (degenerative) of retina, bilateral 05/22/2016   Nuclear cataract 05/22/2016   Retinal hemorrhage of left eye 05/22/2016   Temporal arteritis (HCC) 05/22/2016   Hemolytic anemia (HCC) 03/19/2016   Breast mass, right 07/01/2012    PCP: Aisha Harvey, MD'  REFERRING PROVIDER: Aisha Harvey, MD   REFERRING DIAG: M81.0 (ICD-10-CM) - Age-related osteoporosis without current pathological fracture   Rationale for Evaluation and Treatment: Rehabilitation  THERAPY  DIAG:  Other abnormalities of gait and mobility  Muscle weakness (generalized)  Unsteadiness on feet  ONSET DATE: 11/04/23  SUBJECTIVE:                                                                                                                                                                                           SUBJECTIVE STATEMENT: Patient reports she is doing good today. She felt good after last treatment session.   Eval: Here for OP ,She is still in Afib all the time (see history below). They want her to have an ablation now, before the end of the year. Seeing cardiologist tomorrow. She has been back to work since Aug 2 at [employer]. 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 Working 32 hours. Would like 1x/wk. She is challenged with back to back tours as she is talking. She still feels unsteady at times. It depends on the day. Standing is limited to 10 minutes due to fatigue.   PERTINENT HISTORY:   Vertigo, 12/31/2023:  Pt underwent attempt at cardioversion for a-fib on 12/19/23, which did not last >24 hrs; was hospitalized after ED visit for SOB, CHF.  Anxiety, asthma, CHF, depression, gouty arthropathy, anemia, HLD, HTN, migraines, polymyalgia rheumatica, temporal arteritis, R knee surgery   PAIN:  Are you having pain? No  PRECAUTIONS: None  RED FLAGS: None   WEIGHT BEARING RESTRICTIONS: No  FALLS:  Has patient fallen in last 6 months? No  LIVING ENVIRONMENT: Lives with: lives with their spouse who have stage 4 stomach CA (pt reports that she is having to physically assist him some) Lives in: Other townhouse  Stairs: 2 stories, sleeps on 1st floor in recliner  Has following equipment at home: Environmental consultant - 2 wheeled, cane  OCCUPATION: works 32 hours per week  PLOF: Independent with basic ADLs, Independent with household mobility without device, Independent with community mobility with device, and Vocation/Vocational requirements: walking and  talking  PATIENT GOALS: Help to do with osteoporosis and balance still  NEXT MD VISIT: unsure  OBJECTIVE:  Note: Objective measures were completed at Evaluation unless otherwise noted.  DIAGNOSTIC  FINDINGS:  None recent  PATIENT SURVEYS:  PSFS: THE PATIENT SPECIFIC FUNCTIONAL SCALE  Place score of 0-10 (0 = unable to perform activity and 10 = able to perform activity at the same level as before injury or problem)  Activity Date: 04/15/24    Standing for conversation or tasks unassisted   2    2.endurance with walking   4    3. Managing grocery store runs (including unloading and putting things away)   3    4. Picking up 10# or more and carry   2    Total Score 2.75      Total Score = Sum of activity scores/number of activities  Minimally Detectable Change: 3 points (for single activity); 2 points (for average score)  [facility] (nd). The Patient Specific Functional Scale . Retrieved from [email or web address]   COGNITION: Overall cognitive status: Within functional limits for tasks assessed     SENSATION: WFL   LUMBAR ROM: WNL for tasks assessed   LOWER EXTREMITY ROM:    WNL for tasks assessed   LOWER EXTREMITY MMT:  4 to 5/5 B tested in sitting    FUNCTIONAL TESTS:  5 times sit to stand: 17.01 sec 6 minute walk test: 923 ft RPE 5/10 vitals PR 94 bpm, O2 sats 90% Functional gait assessment:   FUNCTIONAL GAIT ASSESSMENT  Date: 04/29/24 Score  GAIT LEVEL SURFACE Instructions: Walk at your normal speed from here to the next mark (6 m) [20 ft]. (2) Mild impairment - Walks 6 m (20 ft) in less than 7 seconds but greater than 5.5 seconds, uses assistive device, slower speed, mild gait deviations, or deviates 15.24 -25.4 cm (6 -10 in) outside of the 30.48-cm (12-in) walkway width.  2.   CHANGE IN GAIT SPEED Instructions: Begin walking at your normal pace (for 1.5 m [5 ft]). When I tell you "go," walk as  fast as you can (for 1.5 m [5 ft]). When I tell you "slow," walk as slowly as you can (for 1.5 m [5 ft]. (2) Mild impairment - Is able to change speed but demonstrates mild gait deviations, deviates 15.24 -25.4 cm (6 -10 in) outside of the 30.48-cm (12-in) walkway width, or no gait deviations but unable to achieve a significant change in velocity, or uses an assistive device  3.    GAIT WITH HORIZONTAL HEAD TURNS Instructions: Walk from here to the next mark 6 m (20 ft) away. Begin walking at your normal pace. Keep walking straight; after 3 steps, turn your head to the right and keep walking straight while looking to the right. After 3 more steps, turn your head to the left and keep walking straight while looking left. Continue alternating looking right and left. (2) Mild impairment - Performs head turns smoothly with slight change in gait velocity (eg, minor disruption to smooth gait path), deviates 15.24 -25.4 cm (6 -10 in) outside 30.48-cm (12-in) walkway width, or uses an assistive device.  4.   GAIT WITH VERTICAL HEAD TURNS Instructions: Walk from here to the next mark (6 m [20 ft]). Begin walking at your normal pace. Keep walking straight; after 3 steps, tip your head up and keep walking straight while looking up. After 3 more steps, tip your head down, keep walking straight while looking down. Continue  alternating looking up and down every 3 steps until you have completed 2 repetitions in each direction. (2) Mild impairment - Performs task with slight change in gait velocity (eg, minor  disruption to smooth gait path), deviates 15.24 -25.4 cm (6 -10 in) outside 30.48-cm (12-in) walkway width or uses assistive device.  5.  GAIT AND PIVOT TURN Instructions: Begin with walking at your normal pace. When I tell you, "turn and stop," turn as quickly as you can to face the opposite direction and stop. (2) Mild impairment - Pivot turns safely in 3 seconds and stops with no loss of balance, or pivot turns safely  within 3 seconds and stops with mild imbalance, requires small steps to catch balance  6.   STEP OVER OBSTACLE Instructions: Begin walking at your normal speed. When you come to the shoe box, step over it, not around it, and keep walking. (2) Mild impairment - Is able to step over one shoe box (11.43 cm [4.5 in] total height) without changing gait speed; no evidence of imbalance.  7.   GAIT WITH NARROW BASE OF SUPPORT Instructions: Walk on the floor with arms folded across the chest, feet aligned heel to toe in tandem for a distance of 3.6 m [12 ft]. The number of steps taken in a straight line are counted for a maximum of 10 steps. (2) Mild impairment - Ambulates 7-9 steps  8.   GAIT WITH EYES CLOSED Instructions: Walk at your normal speed from here to the next mark (6 m [20 ft]) with your eyes closed. (1) Moderate impairment - Walks 6 m (20 ft), slow speed, abnormal gait pattern, evidence for imbalance, deviates 25.4 -38.1 cm (10 -15 in) outside 30.48-cm (12-in) walkway width. Requires more than 9 seconds to ambulate 6 m (20 ft).  9.   AMBULATING BACKWARDS Instructions: Walk backwards until I tell you to stop (2) Mild impairment - Walks 6 m (20 ft), uses assistive device, slower speed, mild gait deviations, deviates 15.24 -25.4 cm (6 -10 in) outside 30.48-cm (12-in) walkway width  10. STEPS Instructions: Walk up these stairs as you would at home (ie, using the rail if necessary). At the top turn around and walk down. (1) Moderate impairment-Two feet to a stair; must use rail.  Total 18/30   Interpretation of scores: Non-Specific Older Adults Cutoff Score: <=22/30 = risk of falls Parkinson's Disease Cutoff score <15/30= fall risk (Hoehn & Yahr 1-4)  Minimally Clinically Important Difference (MCID)  Stroke (acute, subacute, and chronic) = MDC: 4.2 points Vestibular (acute) = MDC: 6 points Community Dwelling Older Adults =  MCID: 4 points Parkinson's Disease  =  MDC: 4.3 points  (Academy of  Neurologic Physical Therapy (nd). Functional Gait Assessment. Retrieved from https://www.neuropt.org/docs/default-source/cpgs/core-outcome-measures/function-gait-assessment-pocket-guide-proof9-(2).pdf?sfvrsn=b46f35043_0.)  Interpretation of scores: Non-Specific Older Adults Cutoff Score: <=22/30 = risk of falls Parkinson's Disease Cutoff score <15/30= fall risk (Hoehn & Yahr 1-4)  Minimally Clinically Important Difference (MCID)  Stroke (acute, subacute, and chronic) = MDC: 4.2 points Vestibular (acute) = MDC: 6 points Community Dwelling Older Adults =  MCID: 4 points Parkinson's Disease  =  MDC: 4.3 points  (Academy of Neurologic Physical Therapy (nd). Functional Gait Assessment. Retrieved from https://www.neuropt.org/docs/default-source/cpgs/core-outcome-measures/function-gait-assessment-pocket-guide-proof9-(2).pdf?sfvrsn=b59f35043_0.)  GAIT: Safe gait patten with walking stick  TREATMENT DATE:  05/07/24 NuStep Level 5 6 min- PT present to discuss status 6in step ups (unilateral UE support) x 8B  Seated chest press 5# DB x 12 Sit to stand holding 5# DB 2 x 8  Seated steam engines 3# x 10 bilateral  Seated bicips curls 3# 2x 10 Standing march on airex x 10 bilateral Lateral step ups on airex x 8  Seated hamstring curl  with red TB x 10 bilateral  Seated clam with yellow loop 2 x 10 1 lap around both treatment gyms with walking stick Seated punch x 10 bilateral      04/29/24 FGA 18/30 Sit to stand arms crossed or wt staggered stance x 10 L back; even feet x 5  Standing march x 10 B Standing HS curl x 20 B Discussion and practice of purse lipped breathing.  Seated bicips curls 3# 2x 10 OH press 3# 2 x 10 Worked diaphragmatic     04/15/24 See pt ed and HEP  Adjusted walking stick to correct height  PATIENT EDUCATION:  Education details: PT eval  findings, anticipated POC, and HEP review   Person educated: Patient Education method: verbally discussed Education comprehension: verbalized understanding  HOME EXERCISE PROGRAM: Access Code: [email or web address] URL: [email or web address] Date: 04/15/2024 Prepared by: Mliss (previous HEP from recent episode)  Exercises - Sit to Stand with Arms Crossed  - 1 x daily - 5 x weekly - 2 sets - 10 reps - Standing Hamstring Curl with Chair Support  - 1 x daily - 7 x weekly - 3 sets - 10-15 reps - Standing March with Counter Support  - 1 x daily - 7 x weekly - 2-3 sets - 1 min hold - Side Stepping with Counter Support  - 1 x daily - 5 x weekly - 2 sets - 1 min hold - Forward Step Up  - 1 x daily - 5 x weekly - 2-3 sets - 30 sec hold - Walking with Eyes Closed and Counter Support  - 1 x daily - 5 x weekly - 2 sets - 5 reps - Standing Near Stance in Corner with Eyes Closed  - 1 x daily - 5 x weekly - 3 sets - 30 sec hold  ASSESSMENT:  CLINICAL IMPRESSION: Treatment session alternated between standing and sitting exercises due to patient's occasional shortness of breath. She verbalized some fatigue with standing exercises. Step ups on the right were a challenge due to knee pain. Adjusted patient's walking stick to a move comfortable height. Patient was challenged with exercise intensity and required occasional rest breaks due to fatigue. Patient will benefit from skilled PT to address the below impairments and improve overall function.    Eval: Patient is a 78 y.o. female who was seen today for physical therapy evaluation and treatment for osteoporosis and fall prevention . She was recently discharged from neuro PT due to complications from Afib. She has recently returned to work and has deficits with endurance with standing and walking  affecting work and grocery shopping. She also reports deficits with carrying over 10#. She denies pain. She continues to report balance deficits, but states her  balance is much improved and it depends on the day. She will benefit from skilled PT to address these deficits and those listed below. Due to her RTW she will only be available 1x/wk.  OBJECTIVE IMPAIRMENTS: cardiopulmonary status limiting activity, decreased activity tolerance, decreased balance, difficulty walking, and decreased strength.   ACTIVITY LIMITATIONS: carrying, lifting, standing, and locomotion level  PARTICIPATION  LIMITATIONS: laundry, shopping, and occupation  PERSONAL FACTORS: Age, Fitness, Past/current experiences, Time since onset of injury/illness/exacerbation, and 3+ comorbidities: OP, Afib, vertigo are also affecting patient's functional outcome.   REHAB POTENTIAL: Good  CLINICAL DECISION MAKING: Evolving/moderate complexity  EVALUATION COMPLEXITY: Moderate   GOALS: Goals reviewed with patient? Yes  SHORT TERM GOALS: Target date: 05/27/24  Ind with initial HEP Baseline: Goal status: INITIAL  2.  Decreased TUG by 2-3 seconds showing decreased risk for falls. Baseline: 13.77 sec Goal status: INITIAL  3.  Decreased 5XSTS by 2-3 seconds showing functional improvement in strength Baseline:  Goal status: INITIAL   LONG TERM GOALS: Target date: 07/08/24  Ind with advanced HEP Baseline:  Goal status: INITIAL  2.  Improved average PSFS by 2 points showing functional improvement Baseline: 2.75 Goal status: INITIAL  3.  Patient to report improved endurance with grocery shopping by 50%  Baseline:  Goal status: INITIAL  4.  Improved to 1,100 ft or more with device as needed Baseline: 923 ft Goal status: INITIAL  5.  FGA to improve at least 4 points demonstrating decreased risk for falls Baseline: TBD Goal status: INITIAL  6.  Able to carry >= 10# x 25 ft showing improved functional UE strength Baseline:  Goal status: INITIAL  7. Improved standing tolerance to > 15-20 min without AD Baseline:  Goal status: INITIAL  PLAN:  PT FREQUENCY:  1x/week  PT DURATION: 12 weeks  PLANNED INTERVENTIONS: [id number]- PT Re-evaluation, 97110-Therapeutic exercises, 97530- Therapeutic activity, [id number]- Neuromuscular re-education, 97535- Self Care, [id number]- Manual therapy, [phone]- Gait training, Patient/Family education, Balance training, and Stair training.  PLAN FOR NEXT SESSION: assess tolerance to treatment session; adding UE strengthening (carries), gait endurance, balance, functional LE and core strength for osteoporosis/balance    Kristeen Sar, PT 05/07/24 3:30 PM

## 2024-05-11 NOTE — Telephone Encounter (Signed)
 Left message for patient and advised that digoxin  level has been ordered and can be drawn with her pre-procedure lab work. Advised to call back with any questions.

## 2024-05-14 ENCOUNTER — Encounter: Payer: Self-pay | Admitting: Physical Therapy

## 2024-05-14 ENCOUNTER — Ambulatory Visit: Admitting: Physical Therapy

## 2024-05-14 DIAGNOSIS — R2689 Other abnormalities of gait and mobility: Secondary | ICD-10-CM | POA: Diagnosis not present

## 2024-05-14 DIAGNOSIS — R2681 Unsteadiness on feet: Secondary | ICD-10-CM | POA: Diagnosis not present

## 2024-05-14 DIAGNOSIS — M6281 Muscle weakness (generalized): Secondary | ICD-10-CM

## 2024-05-14 NOTE — Therapy (Signed)
 OUTPATIENT PHYSICAL THERAPY THORACOLUMBAR TREATMENT   Patient Name: Michelle Wilcox MRN: [id number] DOB:07-Jan-1946, 78 y.o., female Today's Date: 05/14/2024  END OF SESSION:  PT End of Session - 05/14/24 1553     Visit Number 4    Date for Recertification  07/08/24    Authorization Type BCBS Medicare    Progress Note Due on Visit 10    PT Start Time 1449    PT Stop Time 1529    PT Time Calculation (min) 40 min    Activity Tolerance Patient tolerated treatment well    Behavior During Therapy WFL for tasks assessed/performed             Past Medical History:  Diagnosis Date   Anxiety    hx of   Asthma    at times   CHF (congestive heart failure) (HCC)    Depression    hx of   Dyspnea    Giant cell arteritis (HCC)    Gouty arthropathy    Hemolytic anemia    Hyperlipidemia    Hypertension    Irregular heart beat    extra beat   Migraines    Polymyalgia    Polymyalgia rheumatica    Temporal arteritis (HCC)    Past Surgical History:  Procedure Laterality Date   BARTHOLIN GLAND CYST EXCISION     non ca   BREAST BIOPSY Right    CARDIOVERSION N/A 11/05/2023   Procedure: CARDIOVERSION;  Surgeon: Sheena Pugh, DO;  Location: [facility];  Service: Cardiovascular;  Laterality: N/A;   CARDIOVERSION N/A 12/19/2023   Procedure: CARDIOVERSION;  Surgeon: Santo Stanly LABOR, MD;  Location: [facility];  Service: Cardiovascular;  Laterality: N/A;   EYE MUSCLE SURGERY     as a child 44 yrs old   KNEE SURGERY  2004   rt knee   LAPAROSCOPIC SPLENECTOMY N/A 06/18/2018   Procedure: LAPAROSCOPIC SPLENECTOMY ERAS PATHWAY;  Surgeon: Mikell Katz, MD;  Location: [facility];  Service: General;  Laterality: N/A;   RIGHT/LEFT HEART CATH AND CORONARY ANGIOGRAPHY N/A 03/04/2024   Procedure: RIGHT/LEFT HEART CATH AND CORONARY ANGIOGRAPHY;  Surgeon: Anner Alm ORN, MD;  Location: [facility] CV [facility];  Service: Cardiovascular;  Laterality: N/A;   toncil      TONSILLECTOMY     as a child   TRANSESOPHAGEAL ECHOCARDIOGRAM (CATH LAB) N/A 11/05/2023   Procedure: TRANSESOPHAGEAL ECHOCARDIOGRAM;  Surgeon: Sheena Pugh, DO;  Location: [facility];  Service: Cardiovascular;  Laterality: N/A;   TRANSESOPHAGEAL ECHOCARDIOGRAM (CATH LAB) N/A 12/19/2023   Procedure: TRANSESOPHAGEAL ECHOCARDIOGRAM;  Surgeon: Santo Stanly LABOR, MD;  Location: [facility];  Service: Cardiovascular;  Laterality: N/A;   Patient Active Problem List   Diagnosis Date Noted   Acute on chronic congestive heart failure (HCC) 12/24/2023   Acute on chronic HFrEF (heart failure with reduced ejection fraction) (HCC) 12/21/2023   Atrial fibrillation with rapid ventricular response (HCC) 12/21/2023   Atrial fibrillation, rapid (HCC) 12/21/2023   Pericardial effusion 11/07/2023   Atrial fibrillation with RVR (HCC) 11/05/2023   Borderline abnormal TFTs 11/05/2023   Macrocytosis 11/05/2023   Atrial fibrillation (HCC) 11/04/2023   PMR (polymyalgia rheumatica) 11/04/2023   Primary localized osteoarthrosis of multiple sites 06/15/2022   Diastolic dysfunction without heart failure 03/05/2018   DOE (dyspnea on exertion) 03/05/2018   Preop cardiovascular exam 03/05/2018   Mild reactive airways disease 08/04/2017   Volume overload 08/03/2017   Hypokalemia 08/03/2017   Essential hypertension 08/03/2017  Hyperlipidemia 08/03/2017   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   Hypersensitivity reaction 11/20/2016   Drusen (degenerative) of retina, bilateral 05/22/2016   Nuclear cataract 05/22/2016   Retinal hemorrhage of left eye 05/22/2016   Temporal arteritis (HCC) 05/22/2016   Hemolytic anemia 03/19/2016   Breast mass, right 07/01/2012    PCP: Aisha Harvey, MD'  REFERRING PROVIDER: Aisha Harvey, MD   REFERRING DIAG: M81.0 (ICD-10-CM) - Age-related osteoporosis without current pathological fracture   Rationale for Evaluation and Treatment:  Rehabilitation  THERAPY DIAG:  Other abnormalities of gait and mobility  Muscle weakness (generalized)  Unsteadiness on feet  ONSET DATE: 11/04/23  SUBJECTIVE:                                                                                                                                                                                           SUBJECTIVE STATEMENT: Patient reports she is doing good today. She has been busy running errands.   Eval: Here for OP ,She is still in Afib all the time (see history below). They want her to have an ablation now, before the end of the year. Seeing cardiologist tomorrow. She has been back to work since Aug 2 at [employer]. 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 Working 32 hours. Would like 1x/wk. She is challenged with back to back tours as she is talking. She still feels unsteady at times. It depends on the day. Standing is limited to 10 minutes due to fatigue.   PERTINENT HISTORY:   Vertigo, 12/31/2023:  Pt underwent attempt at cardioversion for a-fib on 12/19/23, which did not last >24 hrs; was hospitalized after ED visit for SOB, CHF.  Anxiety, asthma, CHF, depression, gouty arthropathy, anemia, HLD, HTN, migraines, polymyalgia rheumatica, temporal arteritis, R knee surgery   PAIN:  Are you having pain? No  PRECAUTIONS: None  RED FLAGS: None   WEIGHT BEARING RESTRICTIONS: No  FALLS:  Has patient fallen in last 6 months? No  LIVING ENVIRONMENT: Lives with: lives with their spouse who have stage 4 stomach CA (pt reports that she is having to physically assist him some) Lives in: Other townhouse  Stairs: 2 stories, sleeps on 1st floor in recliner  Has following equipment at home: Environmental consultant - 2 wheeled, cane  OCCUPATION: works 32 hours per week  PLOF: Independent with basic ADLs, Independent with household mobility without device, Independent with community mobility with device, and Vocation/Vocational  requirements: walking and talking  PATIENT GOALS: Help to do with osteoporosis and balance still  NEXT MD VISIT: unsure  OBJECTIVE:  Note: Objective measures were completed at Evaluation unless otherwise  noted.  DIAGNOSTIC FINDINGS:  None recent  PATIENT SURVEYS:  PSFS: THE PATIENT SPECIFIC FUNCTIONAL SCALE  Place score of 0-10 (0 = unable to perform activity and 10 = able to perform activity at the same level as before injury or problem)  Activity Date: 04/15/24    Standing for conversation or tasks unassisted   2    2.endurance with walking   4    3. Managing grocery store runs (including unloading and putting things away)   3    4. Picking up 10# or more and carry   2    Total Score 2.75      Total Score = Sum of activity scores/number of activities  Minimally Detectable Change: 3 points (for single activity); 2 points (for average score)  [facility] (nd). The Patient Specific Functional Scale . Retrieved from [email or web address]   COGNITION: Overall cognitive status: Within functional limits for tasks assessed     SENSATION: WFL   LUMBAR ROM: WNL for tasks assessed   LOWER EXTREMITY ROM:    WNL for tasks assessed   LOWER EXTREMITY MMT:  4 to 5/5 B tested in sitting    FUNCTIONAL TESTS:  5 times sit to stand: 17.01 sec 6 minute walk test: 923 ft RPE 5/10 vitals PR 94 bpm, O2 sats 90% Functional gait assessment:   FUNCTIONAL GAIT ASSESSMENT  Date: 04/29/24 Score  GAIT LEVEL SURFACE Instructions: Walk at your normal speed from here to the next mark (6 m) [20 ft]. (2) Mild impairment - Walks 6 m (20 ft) in less than 7 seconds but greater than 5.5 seconds, uses assistive device, slower speed, mild gait deviations, or deviates 15.24 -25.4 cm (6 -10 in) outside of the 30.48-cm (12-in) walkway width.  2.   CHANGE IN GAIT SPEED Instructions: Begin walking at your normal pace (for 1.5 m [5 ft]). When I  tell you "go," walk as fast as you can (for 1.5 m [5 ft]). When I tell you "slow," walk as slowly as you can (for 1.5 m [5 ft]. (2) Mild impairment - Is able to change speed but demonstrates mild gait deviations, deviates 15.24 -25.4 cm (6 -10 in) outside of the 30.48-cm (12-in) walkway width, or no gait deviations but unable to achieve a significant change in velocity, or uses an assistive device  3.    GAIT WITH HORIZONTAL HEAD TURNS Instructions: Walk from here to the next mark 6 m (20 ft) away. Begin walking at your normal pace. Keep walking straight; after 3 steps, turn your head to the right and keep walking straight while looking to the right. After 3 more steps, turn your head to the left and keep walking straight while looking left. Continue alternating looking right and left. (2) Mild impairment - Performs head turns smoothly with slight change in gait velocity (eg, minor disruption to smooth gait path), deviates 15.24 -25.4 cm (6 -10 in) outside 30.48-cm (12-in) walkway width, or uses an assistive device.  4.   GAIT WITH VERTICAL HEAD TURNS Instructions: Walk from here to the next mark (6 m [20 ft]). Begin walking at your normal pace. Keep walking straight; after 3 steps, tip your head up and keep walking straight while looking up. After 3 more steps, tip your head down, keep walking straight while looking down. Continue  alternating looking up and down every 3 steps until you have completed 2 repetitions in each direction. (2) Mild impairment - Performs task with slight change in gait  velocity (eg, minor disruption to smooth gait path), deviates 15.24 -25.4 cm (6 -10 in) outside 30.48-cm (12-in) walkway width or uses assistive device.  5.  GAIT AND PIVOT TURN Instructions: Begin with walking at your normal pace. When I tell you, "turn and stop," turn as quickly as you can to face the opposite direction and stop. (2) Mild impairment - Pivot turns safely in 3 seconds and stops with no loss of  balance, or pivot turns safely within 3 seconds and stops with mild imbalance, requires small steps to catch balance  6.   STEP OVER OBSTACLE Instructions: Begin walking at your normal speed. When you come to the shoe box, step over it, not around it, and keep walking. (2) Mild impairment - Is able to step over one shoe box (11.43 cm [4.5 in] total height) without changing gait speed; no evidence of imbalance.  7.   GAIT WITH NARROW BASE OF SUPPORT Instructions: Walk on the floor with arms folded across the chest, feet aligned heel to toe in tandem for a distance of 3.6 m [12 ft]. The number of steps taken in a straight line are counted for a maximum of 10 steps. (2) Mild impairment - Ambulates 7-9 steps  8.   GAIT WITH EYES CLOSED Instructions: Walk at your normal speed from here to the next mark (6 m [20 ft]) with your eyes closed. (1) Moderate impairment - Walks 6 m (20 ft), slow speed, abnormal gait pattern, evidence for imbalance, deviates 25.4 -38.1 cm (10 -15 in) outside 30.48-cm (12-in) walkway width. Requires more than 9 seconds to ambulate 6 m (20 ft).  9.   AMBULATING BACKWARDS Instructions: Walk backwards until I tell you to stop (2) Mild impairment - Walks 6 m (20 ft), uses assistive device, slower speed, mild gait deviations, deviates 15.24 -25.4 cm (6 -10 in) outside 30.48-cm (12-in) walkway width  10. STEPS Instructions: Walk up these stairs as you would at home (ie, using the rail if necessary). At the top turn around and walk down. (1) Moderate impairment-Two feet to a stair; must use rail.  Total 18/30   Interpretation of scores: Non-Specific Older Adults Cutoff Score: <=22/30 = risk of falls Parkinson's Disease Cutoff score <15/30= fall risk (Hoehn & Yahr 1-4)  Minimally Clinically Important Difference (MCID)  Stroke (acute, subacute, and chronic) = MDC: 4.2 points Vestibular (acute) = MDC: 6 points Community Dwelling Older Adults =  MCID: 4 points Parkinson's Disease  =   MDC: 4.3 points  ([employer]y [employer] (nd). Functional Gait Assessment. Retrieved from https://www.neuropt.org/docs/default-source/cpgs/core-outcome-measures/function-gait-assessment-pocket-guide-proof9-(2).pdf?sfvrsn=b40f35043_0.)  Interpretation of scores: Non-Specific Older Adults Cutoff Score: <=22/30 = risk of falls Parkinson's Disease Cutoff score <15/30= fall risk (Hoehn & Yahr 1-4)  Minimally Clinically Important Difference (MCID)  Stroke (acute, subacute, and chronic) = MDC: 4.2 points Vestibular (acute) = MDC: 6 points Community Dwelling Older Adults =  MCID: 4 points Parkinson's Disease  =  MDC: 4.3 points  (Academy of Neurologic Physical Therapy (nd). Functional Gait Assessment. Retrieved from https://www.neuropt.org/docs/default-source/cpgs/core-outcome-measures/function-gait-assessment-pocket-guide-proof9-(2).pdf?sfvrsn=b67f35043_0.)  GAIT: Safe gait patten with walking stick  TREATMENT DATE:  05/14/24 NuStep Level 5 6 min- PT present to discuss status Seated chest press 5# DB 2 x 12 Seated reclined crunch 5# DB x 12 Sit to stand holding 5# DB x 12 Unilateral farmer's carry 5# DB x 2 laps down long hallway and back (rest break in between) using walking stick Seated bicips curls 3# 2x 10 Seated steam engines 3# x 10 bilateral  Airex step ups + knee drive x 8 bilateral  Standing heel raise on airex 2 x 10 Seated clam with yellow loop 2 x 10     05/07/24 NuStep Level 5 6 min- PT present to discuss status 6in step ups (unilateral UE support) x 8B  Seated chest press 5# DB x 12 Sit to stand holding 5# DB 2 x 8  Seated steam engines 3# x 10 bilateral  Seated bicips curls 3# 2x 10 Standing march on airex x 10 bilateral Lateral step ups on airex x 8  Seated hamstring curl  with red TB x 10 bilateral  Seated clam with yellow loop  2 x 10 1 lap around both treatment gyms with walking stick Seated punch x 10 bilateral      04/29/24 FGA 18/30 Sit to stand arms crossed or wt staggered stance x 10 L back; even feet x 5  Standing march x 10 B Standing HS curl x 20 B Discussion and practice of purse lipped breathing.  Seated bicips curls 3# 2x 10 OH press 3# 2 x 10 Worked diaphragmatic    PATIENT EDUCATION:  Education details: PT eval findings, anticipated POC, and HEP review   Person educated: Patient Education method: verbally discussed Education comprehension: verbalized understanding  HOME EXERCISE PROGRAM: Access Code: [email or web address] URL: [email or web address] Date: 04/15/2024 Prepared by: Mliss (previous HEP from recent episode)  Exercises - Sit to Stand with Arms Crossed  - 1 x daily - 5 x weekly - 2 sets - 10 reps - Standing Hamstring Curl with Chair Support  - 1 x daily - 7 x weekly - 3 sets - 10-15 reps - Standing March with Counter Support  - 1 x daily - 7 x weekly - 2-3 sets - 1 min hold - Side Stepping with Counter Support  - 1 x daily - 5 x weekly - 2 sets - 1 min hold - Forward Step Up  - 1 x daily - 5 x weekly - 2-3 sets - 30 sec hold - Walking with Eyes Closed and Counter Support  - 1 x daily - 5 x weekly - 2 sets - 5 reps - Standing Near Stance in Corner with Eyes Closed  - 1 x daily - 5 x weekly - 3 sets - 30 sec hold  ASSESSMENT:  CLINICAL IMPRESSION: Treatment session focused on functional strengthening and weight bearing exercises. Incorporated unilateral farmer's carries with a 5# weight and this was a good challenge for patient. Noted visible shortness of breath during laps, but after a seated rest break patient was able to complete next lap. Holding a 5# DB was more challenging in her left compared to the right. PT provided occasional verbal cues for correct breathing technique. Patient will benefit from skilled PT to address the below impairments and improve overall  function.     Eval: Patient is a 78 y.o. female who was seen today for physical therapy evaluation and treatment for osteoporosis and fall prevention . She was recently discharged from neuro PT due to complications from Afib. She has recently returned to work and has  deficits with endurance with standing and walking  affecting work and grocery shopping. She also reports deficits with carrying over 10#. She denies pain. She continues to report balance deficits, but states her balance is much improved and it depends on the day. She will benefit from skilled PT to address these deficits and those listed below. Due to her RTW she will only be available 1x/wk.  OBJECTIVE IMPAIRMENTS: cardiopulmonary status limiting activity, decreased activity tolerance, decreased balance, difficulty walking, and decreased strength.   ACTIVITY LIMITATIONS: carrying, lifting, standing, and locomotion level  PARTICIPATION LIMITATIONS: laundry, shopping, and occupation  PERSONAL FACTORS: Age, Fitness, Past/current experiences, Time since onset of injury/illness/exacerbation, and 3+ comorbidities: OP, Afib, vertigo are also affecting patient's functional outcome.   REHAB POTENTIAL: Good  CLINICAL DECISION MAKING: Evolving/moderate complexity  EVALUATION COMPLEXITY: Moderate   GOALS: Goals reviewed with patient? Yes  SHORT TERM GOALS: Target date: 05/27/24  Ind with initial HEP Baseline: Goal status: INITIAL  2.  Decreased TUG by 2-3 seconds showing decreased risk for falls. Baseline: 13.77 sec Goal status: INITIAL  3.  Decreased 5XSTS by 2-3 seconds showing functional improvement in strength Baseline:  Goal status: INITIAL   LONG TERM GOALS: Target date: 07/08/24  Ind with advanced HEP Baseline:  Goal status: INITIAL  2.  Improved average PSFS by 2 points showing functional improvement Baseline: 2.75 Goal status: INITIAL  3.  Patient to report improved endurance with grocery shopping by 50%   Baseline:  Goal status: INITIAL  4.  Improved to 1,100 ft or more with device as needed Baseline: 923 ft Goal status: INITIAL  5.  FGA to improve at least 4 points demonstrating decreased risk for falls Baseline: TBD Goal status: INITIAL  6.  Able to carry >= 10# x 25 ft showing improved functional UE strength Baseline:  Goal status: INITIAL  7. Improved standing tolerance to > 15-20 min without AD Baseline:  Goal status: INITIAL  PLAN:  PT FREQUENCY: 1x/week  PT DURATION: 12 weeks  PLANNED INTERVENTIONS: 97164- PT Re-evaluation, 97110-Therapeutic exercises, 97530- Therapeutic activity, [id number]- Neuromuscular re-education, 97535- Self Care, [id number]- Manual therapy, [id number]- Gait training, Patient/Family education, Balance training, and Stair training.  PLAN FOR NEXT SESSION: continue carries; gait endurance, balance, functional LE and core strength for osteoporosis/balance    Kristeen Sar, PT 05/14/24 3:54 PM

## 2024-05-18 ENCOUNTER — Inpatient Hospital Stay: Payer: Medicare Other | Attending: Oncology | Admitting: Oncology

## 2024-05-18 ENCOUNTER — Inpatient Hospital Stay: Payer: Self-pay

## 2024-05-18 ENCOUNTER — Other Ambulatory Visit: Payer: Medicare Other

## 2024-05-18 VITALS — BP 139/77 | HR 93 | Temp 97.8°F | Resp 18 | Ht 63.0 in | Wt 178.3 lb

## 2024-05-18 DIAGNOSIS — D591 Autoimmune hemolytic anemia, unspecified: Secondary | ICD-10-CM

## 2024-05-18 DIAGNOSIS — D5911 Warm autoimmune hemolytic anemia: Secondary | ICD-10-CM | POA: Insufficient documentation

## 2024-05-18 DIAGNOSIS — Z9081 Acquired absence of spleen: Secondary | ICD-10-CM | POA: Diagnosis not present

## 2024-05-18 LAB — CBC WITH DIFFERENTIAL (CANCER CENTER ONLY)
Abs Immature Granulocytes: 0.03 K/uL (ref 0.00–0.07)
Basophils Absolute: 0.1 K/uL (ref 0.0–0.1)
Basophils Relative: 1 %
Eosinophils Absolute: 0.3 K/uL (ref 0.0–0.5)
Eosinophils Relative: 3 %
HCT: 43 % (ref 36.0–46.0)
Hemoglobin: 14 g/dL (ref 12.0–15.0)
Immature Granulocytes: 0 %
Lymphocytes Relative: 13 %
Lymphs Abs: 1.6 K/uL (ref 0.7–4.0)
MCH: 32.2 pg (ref 26.0–34.0)
MCHC: 32.6 g/dL (ref 30.0–36.0)
MCV: 98.9 fL (ref 80.0–100.0)
Monocytes Absolute: 0.7 K/uL (ref 0.1–1.0)
Monocytes Relative: 6 %
Neutro Abs: 9.5 K/uL — ABNORMAL HIGH (ref 1.7–7.7)
Neutrophils Relative %: 77 %
Platelet Count: 313 K/uL (ref 150–400)
RBC: 4.35 MIL/uL (ref 3.87–5.11)
RDW: 14.6 % (ref 11.5–15.5)
WBC Count: 12.3 K/uL — ABNORMAL HIGH (ref 4.0–10.5)
nRBC: 0 % (ref 0.0–0.2)

## 2024-05-18 MED ORDER — PREDNISONE 5 MG PO TABS: 5.0000 mg | ORAL_TABLET | Freq: Every day | ORAL | 3 refills | Status: AC

## 2024-05-18 NOTE — Progress Notes (Signed)
 [facility] OFFICE PROGRESS NOTE   Diagnosis: Autoimmune hemolytic anemia  INTERVAL HISTORY:   Michelle Wilcox returns as scheduled.  She has been diagnosed with atrial fibrillation.SABRA  She was noted to have a left atrial appendage thrombus on a TEE.  She has undergone a cardioversion and has persistent atrial fibrillation.  She is scheduled for an ablation.  She continues prednisone  for arthritis and arteritis.  She is participating in physical therapy.  No fever, night sweats, or anorexia.  Objective:  Vital signs in last 24 hours:  Blood pressure 139/77, pulse 93, temperature 97.8 F (36.6 C), temperature source Temporal, resp. rate 18, height 5' 3 (1.6 m), weight 178 lb 4.8 oz (80.9 kg), SpO2 98%.  Lymphatics: No cervical, supraclavicular, axillary, or inguinal nodes Resp: Lungs clear bilaterally Cardio: Irregular GI: No hepatomegaly Vascular: No leg edema  Lab Results:  Lab Results  Component Value Date   WBC 12.3 (H) 05/18/2024   HGB 14.0 05/18/2024   HCT 43.0 05/18/2024   MCV 98.9 05/18/2024   PLT 313 05/18/2024   NEUTROABS 9.5 (H) 05/18/2024    CMP  Lab Results  Component Value Date   NA 141 03/20/2024   K 4.2 03/20/2024   CL 100 03/20/2024   CO2 25 03/20/2024   GLUCOSE 97 03/20/2024   BUN 14 03/20/2024   CREATININE 0.81 03/20/2024   CALCIUM  9.2 03/20/2024   PROT 6.3 02/25/2024   ALBUMIN 4.4 02/25/2024   AST 26 02/25/2024   ALT 12 02/25/2024   ALKPHOS 85 02/25/2024   BILITOT 0.3 02/25/2024   GFRNONAA >60 12/25/2023   GFRAA >60 06/19/2018    No results found for: CEA1, CEA, CAN199, CA125  No results found for: INR, LABPROT  Imaging:  No results found.  Medications: I have reviewed the patient's current medications.   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, DAT IgG positive Prednisone  started 03/20/2016 Tapered to 15 mg daily on 04/30/2016 Hemoglobin lower,  elevated LDH 05/21/2016-prednisone  increased to 30 mg daily Prednisone  taper to 25 mg daily 06/18/2016 Prednisone  taper to 20 mg daily 07/16/2016 Prednisone  taper to 15 mg daily 08/27/2016 Prednisone  taper to 12.5 mg daily 09/25/2016 Hemoglobin lower, prednisone  increased to 20 mg daily on 10/11/2016 Cycle 1 rituximab  11/19/2016 Cycle 2 rituximab  11/26/2016 Cycle 3 rituximab  12/03/2016 Cycle 4 rituximab  12/28/2016 Prednisone  taper to 15 mg daily 12/28/2016 Prednisone  taper to 12.5 mg daily 01/28/2017 Prednisone  taper to 10 mg daily 02/19/2017 Prednisone  taper to 7.5 mg daily 04/01/2017 Prednisone  taper to 5 mg daily 05/20/2017 Admission with progressive anemia and evidence of hemolysis 08/03/2017 Prednisone  resumed at a dose of 60 mg daily Prednisone  taper to 40 mg 08/12/2017 Prednisone  taper to 30 mg daily 08/24/2017 Prednisone  taper to 20 mg daily 09/03/2017 Prednisone  taper to 15 mg daily 09/20/2017 Prednisone  taper to 12.5 mg daily 10/14/2017 Pednisone taper to 10 mg daily 11/05/2017 Progressive anemia 12/16/2017 Prednisone  increased to 20 mg daily 12/16/2017 Prednisone  decreased to 15 mg daily 01/06/2018 Prednisone  decreased to 12.5 mg daily 02/17/2018  Prednisone  taper to 12.5 mg alternating with 10 mg daily 03/17/2018 Splenectomy 06/18/2018 Prednisone  decreased to 10 mg daily 07/07/2018 Prednisone  decreased to 7.5 mg daily 07/30/2018 Prednisone  changed to 7.5 alternating with 5 mg daily 08/18/2018 Prednisone  changed to 5 mg daily 09/08/2018   2.  History of Exertional dyspnea secondary to #1   3.   Mild splenomegaly on CT 03/16/2016   4.   History of polymyalgia rheumatica   5.  History of giant cell/temporal arteritis-maintained on methotrexate in the remote past, now on low dose prednisone    6.   Admission with leg edema 08/03/2017- diastolic heart failure?,  Exacerbated by anemia   7.   Right arm superficial phlebitis 03/17/2018, resolved        Disposition: [name] remains in clinical remission from the hemolytic anemia.  She continues low-dose prednisone  for treatment of arteritis.  She underwent a splenectomy in 2019.  She will consult with the pharmacy to obtain repeat postsplenectomy vaccines.  She received a COVID-19 vaccine last week and will obtain an influenza vaccine.  Michelle Wilcox will return for an office and lab visit in 1 year. Arley Hof, MD  05/18/2024  11:58 AM

## 2024-05-19 ENCOUNTER — Other Ambulatory Visit: Payer: Self-pay | Admitting: Cardiology

## 2024-05-19 NOTE — Telephone Encounter (Signed)
 Prescription refill request for Eliquis  received. Indication: AF Last office visit: 04/16/24  JINNY Kitty MD Scr: 0.81 on 03/20/24  Epic Age: 78 Weight: 79.8kg  Based on above findings Eliquis  5mg  twice daily is the appropriate dose.  Refill approved.

## 2024-05-21 ENCOUNTER — Encounter: Payer: Self-pay | Admitting: Physical Therapy

## 2024-05-21 ENCOUNTER — Ambulatory Visit: Attending: Family Medicine | Admitting: Physical Therapy

## 2024-05-21 DIAGNOSIS — R2689 Other abnormalities of gait and mobility: Secondary | ICD-10-CM | POA: Insufficient documentation

## 2024-05-21 DIAGNOSIS — R252 Cramp and spasm: Secondary | ICD-10-CM | POA: Diagnosis not present

## 2024-05-21 DIAGNOSIS — R262 Difficulty in walking, not elsewhere classified: Secondary | ICD-10-CM | POA: Insufficient documentation

## 2024-05-21 DIAGNOSIS — M6281 Muscle weakness (generalized): Secondary | ICD-10-CM | POA: Insufficient documentation

## 2024-05-21 DIAGNOSIS — R293 Abnormal posture: Secondary | ICD-10-CM | POA: Insufficient documentation

## 2024-05-21 DIAGNOSIS — R2681 Unsteadiness on feet: Secondary | ICD-10-CM | POA: Diagnosis not present

## 2024-05-21 DIAGNOSIS — M5459 Other low back pain: Secondary | ICD-10-CM | POA: Insufficient documentation

## 2024-05-21 NOTE — Therapy (Signed)
 OUTPATIENT PHYSICAL THERAPY THORACOLUMBAR TREATMENT   Patient Name: Michelle Wilcox MRN: [id number] DOB:12/07/1945, 78 y.o., female Today's Date: 05/21/2024  END OF SESSION:  PT End of Session - 05/21/24 1632     Visit Number 5    Date for Recertification  07/08/24    Authorization Type BCBS Medicare    Progress Note Due on Visit 10    PT Start Time 1534    PT Stop Time 1617    PT Time Calculation (min) 43 min    Activity Tolerance Patient tolerated treatment well    Behavior During Therapy WFL for tasks assessed/performed              Past Medical History:  Diagnosis Date   Anxiety    hx of   Asthma    at times   CHF (congestive heart failure) (HCC)    Depression    hx of   Dyspnea    Giant cell arteritis (HCC)    Gouty arthropathy    Hemolytic anemia    Hyperlipidemia    Hypertension    Irregular heart beat    extra beat   Migraines    Polymyalgia    Polymyalgia rheumatica    Temporal arteritis (HCC)    Past Surgical History:  Procedure Laterality Date   BARTHOLIN GLAND CYST EXCISION     non ca   BREAST BIOPSY Right    CARDIOVERSION N/A 11/05/2023   Procedure: CARDIOVERSION;  Surgeon: Sheena Pugh, DO;  Location: [facility];  Service: Cardiovascular;  Laterality: N/A;   CARDIOVERSION N/A 12/19/2023   Procedure: CARDIOVERSION;  Surgeon: Santo Stanly LABOR, MD;  Location: [facility];  Service: Cardiovascular;  Laterality: N/A;   EYE MUSCLE SURGERY     as a child 69 yrs old   KNEE SURGERY  2004   rt knee   LAPAROSCOPIC SPLENECTOMY N/A 06/18/2018   Procedure: LAPAROSCOPIC SPLENECTOMY ERAS PATHWAY;  Surgeon: Mikell Katz, MD;  Location: [facility];  Service: General;  Laterality: N/A;   RIGHT/LEFT HEART CATH AND CORONARY ANGIOGRAPHY N/A 03/04/2024   Procedure: RIGHT/LEFT HEART CATH AND CORONARY ANGIOGRAPHY;  Surgeon: Anner Alm ORN, MD;  Location: [facility];  Service: Cardiovascular;  Laterality: N/A;   toncil      TONSILLECTOMY     as a child   TRANSESOPHAGEAL ECHOCARDIOGRAM (CATH LAB) N/A 11/05/2023   Procedure: TRANSESOPHAGEAL ECHOCARDIOGRAM;  Surgeon: Sheena Pugh, DO;  Location: [facility];  Service: Cardiovascular;  Laterality: N/A;   TRANSESOPHAGEAL ECHOCARDIOGRAM (CATH LAB) N/A 12/19/2023   Procedure: TRANSESOPHAGEAL ECHOCARDIOGRAM;  Surgeon: Santo Stanly LABOR, MD;  Location: [facility];  Service: Cardiovascular;  Laterality: N/A;   Patient Active Problem List   Diagnosis Date Noted   Acute on chronic congestive heart failure (HCC) 12/24/2023   Acute on chronic HFrEF (heart failure with reduced ejection fraction) (HCC) 12/21/2023   Atrial fibrillation with rapid ventricular response (HCC) 12/21/2023   Atrial fibrillation, rapid (HCC) 12/21/2023   Pericardial effusion 11/07/2023   Atrial fibrillation with RVR (HCC) 11/05/2023   Borderline abnormal TFTs 11/05/2023   Macrocytosis 11/05/2023   Atrial fibrillation (HCC) 11/04/2023   PMR (polymyalgia rheumatica) 11/04/2023   Primary localized osteoarthrosis of multiple sites 06/15/2022   Diastolic dysfunction without heart failure 03/05/2018   DOE (dyspnea on exertion) 03/05/2018   Preop cardiovascular exam 03/05/2018   Mild reactive airways disease 08/04/2017   Volume overload 08/03/2017   Hypokalemia 08/03/2017   Essential hypertension 08/03/2017  Hyperlipidemia 08/03/2017   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   Hypersensitivity reaction 11/20/2016   Drusen (degenerative) of retina, bilateral 05/22/2016   Nuclear cataract 05/22/2016   Retinal hemorrhage of left eye 05/22/2016   Temporal arteritis (HCC) 05/22/2016   Hemolytic anemia 03/19/2016   Breast mass, right 07/01/2012    PCP: Aisha Harvey, MD'  REFERRING PROVIDER: Aisha Harvey, MD   REFERRING DIAG: M81.0 (ICD-10-CM) - Age-related osteoporosis without current pathological fracture   Rationale for Evaluation and Treatment:  Rehabilitation  THERAPY DIAG:  Other abnormalities of gait and mobility  Muscle weakness (generalized)  Unsteadiness on feet  ONSET DATE: 11/04/23  SUBJECTIVE:                                                                                                                                                                                           SUBJECTIVE STATEMENT: Patient reports she is doing good today. She felt good after last treatment session   Eval: Here for OP ,She is still in Afib all the time (see history below). They want her to have an ablation now, before the end of the year. Seeing cardiologist tomorrow. She has been back to work since Aug 2 at [employer]. 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 Working 32 hours. Would like 1x/wk. She is challenged with back to back tours as she is talking. She still feels unsteady at times. It depends on the day. Standing is limited to 10 minutes due to fatigue.   PERTINENT HISTORY:   Vertigo, 12/31/2023:  Pt underwent attempt at cardioversion for a-fib on 12/19/23, which did not last >24 hrs; was hospitalized after ED visit for SOB, CHF.  Anxiety, asthma, CHF, depression, gouty arthropathy, anemia, HLD, HTN, migraines, polymyalgia rheumatica, temporal arteritis, R knee surgery   PAIN:  Are you having pain? No  PRECAUTIONS: None  RED FLAGS: None   WEIGHT BEARING RESTRICTIONS: No  FALLS:  Has patient fallen in last 6 months? No  LIVING ENVIRONMENT: Lives with: lives with their spouse who have stage 4 stomach CA (pt reports that she is having to physically assist him some) Lives in: Other townhouse  Stairs: 2 stories, sleeps on 1st floor in recliner  Has following equipment at home: Environmental consultant - 2 wheeled, cane  OCCUPATION: works 32 hours per week  PLOF: Independent with basic ADLs, Independent with household mobility without device, Independent with community mobility with device, and Vocation/Vocational  requirements: walking and talking  PATIENT GOALS: Help to do with osteoporosis and balance still  NEXT MD VISIT: unsure  OBJECTIVE:  Note: Objective measures were completed at Evaluation unless  otherwise noted.  DIAGNOSTIC FINDINGS:  None recent  PATIENT SURVEYS:  PSFS: THE PATIENT SPECIFIC FUNCTIONAL SCALE  Place score of 0-10 (0 = unable to perform activity and 10 = able to perform activity at the same level as before injury or problem)  Activity Date: 04/15/24    Standing for conversation or tasks unassisted   2    2.endurance with walking   4    3. Managing grocery store runs (including unloading and putting things away)   3    4. Picking up 10# or more and carry   2    Total Score 2.75      Total Score = Sum of activity scores/number of activities  Minimally Detectable Change: 3 points (for single activity); 2 points (for average score)  [facility] (nd). The Patient Specific Functional Scale . Retrieved from [email or web address]   COGNITION: Overall cognitive status: Within functional limits for tasks assessed     SENSATION: WFL   LUMBAR ROM: WNL for tasks assessed   LOWER EXTREMITY ROM:    WNL for tasks assessed   LOWER EXTREMITY MMT:  4 to 5/5 B tested in sitting    FUNCTIONAL TESTS:  5 times sit to stand: 17.01 sec 6 minute walk test: 923 ft RPE 5/10 vitals PR 94 bpm, O2 sats 90% Functional gait assessment:   FUNCTIONAL GAIT ASSESSMENT  Date: 04/29/24 Score  GAIT LEVEL SURFACE Instructions: Walk at your normal speed from here to the next mark (6 m) [20 ft]. (2) Mild impairment - Walks 6 m (20 ft) in less than 7 seconds but greater than 5.5 seconds, uses assistive device, slower speed, mild gait deviations, or deviates 15.24 -25.4 cm (6 -10 in) outside of the 30.48-cm (12-in) walkway width.  2.   CHANGE IN GAIT SPEED Instructions: Begin walking at your normal pace (for 1.5 m [5 ft]). When I  tell you "go," walk as fast as you can (for 1.5 m [5 ft]). When I tell you "slow," walk as slowly as you can (for 1.5 m [5 ft]. (2) Mild impairment - Is able to change speed but demonstrates mild gait deviations, deviates 15.24 -25.4 cm (6 -10 in) outside of the 30.48-cm (12-in) walkway width, or no gait deviations but unable to achieve a significant change in velocity, or uses an assistive device  3.    GAIT WITH HORIZONTAL HEAD TURNS Instructions: Walk from here to the next mark 6 m (20 ft) away. Begin walking at your normal pace. Keep walking straight; after 3 steps, turn your head to the right and keep walking straight while looking to the right. After 3 more steps, turn your head to the left and keep walking straight while looking left. Continue alternating looking right and left. (2) Mild impairment - Performs head turns smoothly with slight change in gait velocity (eg, minor disruption to smooth gait path), deviates 15.24 -25.4 cm (6 -10 in) outside 30.48-cm (12-in) walkway width, or uses an assistive device.  4.   GAIT WITH VERTICAL HEAD TURNS Instructions: Walk from here to the next mark (6 m [20 ft]). Begin walking at your normal pace. Keep walking straight; after 3 steps, tip your head up and keep walking straight while looking up. After 3 more steps, tip your head down, keep walking straight while looking down. Continue  alternating looking up and down every 3 steps until you have completed 2 repetitions in each direction. (2) Mild impairment - Performs task with slight change in  gait velocity (eg, minor disruption to smooth gait path), deviates 15.24 -25.4 cm (6 -10 in) outside 30.48-cm (12-in) walkway width or uses assistive device.  5.  GAIT AND PIVOT TURN Instructions: Begin with walking at your normal pace. When I tell you, "turn and stop," turn as quickly as you can to face the opposite direction and stop. (2) Mild impairment - Pivot turns safely in 3 seconds and stops with no loss of  balance, or pivot turns safely within 3 seconds and stops with mild imbalance, requires small steps to catch balance  6.   STEP OVER OBSTACLE Instructions: Begin walking at your normal speed. When you come to the shoe box, step over it, not around it, and keep walking. (2) Mild impairment - Is able to step over one shoe box (11.43 cm [4.5 in] total height) without changing gait speed; no evidence of imbalance.  7.   GAIT WITH NARROW BASE OF SUPPORT Instructions: Walk on the floor with arms folded across the chest, feet aligned heel to toe in tandem for a distance of 3.6 m [12 ft]. The number of steps taken in a straight line are counted for a maximum of 10 steps. (2) Mild impairment - Ambulates 7-9 steps  8.   GAIT WITH EYES CLOSED Instructions: Walk at your normal speed from here to the next mark (6 m [20 ft]) with your eyes closed. (1) Moderate impairment - Walks 6 m (20 ft), slow speed, abnormal gait pattern, evidence for imbalance, deviates 25.4 -38.1 cm (10 -15 in) outside 30.48-cm (12-in) walkway width. Requires more than 9 seconds to ambulate 6 m (20 ft).  9.   AMBULATING BACKWARDS Instructions: Walk backwards until I tell you to stop (2) Mild impairment - Walks 6 m (20 ft), uses assistive device, slower speed, mild gait deviations, deviates 15.24 -25.4 cm (6 -10 in) outside 30.48-cm (12-in) walkway width  10. STEPS Instructions: Walk up these stairs as you would at home (ie, using the rail if necessary). At the top turn around and walk down. (1) Moderate impairment-Two feet to a stair; must use rail.  Total 18/30   Interpretation of scores: Non-Specific Older Adults Cutoff Score: <=22/30 = risk of falls Parkinson's Disease Cutoff score <15/30= fall risk (Hoehn & Yahr 1-4)  Minimally Clinically Important Difference (MCID)  Stroke (acute, subacute, and chronic) = MDC: 4.2 points Vestibular (acute) = MDC: 6 points Community Dwelling Older Adults =  MCID: 4 points Parkinson's Disease  =   MDC: 4.3 points  ([employer]y [employer] (nd). Functional Gait Assessment. Retrieved from https://www.neuropt.org/docs/default-source/cpgs/core-outcome-measures/function-gait-assessment-pocket-guide-proof9-(2).pdf?sfvrsn=b81f35043_0.)  Interpretation of scores: Non-Specific Older Adults Cutoff Score: <=22/30 = risk of falls Parkinson's Disease Cutoff score <15/30= fall risk (Hoehn & Yahr 1-4)  Minimally Clinically Important Difference (MCID)  Stroke (acute, subacute, and chronic) = MDC: 4.2 points Vestibular (acute) = MDC: 6 points Community Dwelling Older Adults =  MCID: 4 points Parkinson's Disease  =  MDC: 4.3 points  (Academy of Neu[employer]ic Physical Therapy (nd). Functional Gait Assessment. Retrieved from https://www.neuropt.org/docs/default-source/cpgs/core-outcome-measures/function-gait-assessment-pocket-guide-proof9-(2).pdf?sfvrsn=b88f35043_0.)  GAIT: Safe gait patten with walking stick  TREATMENT DATE:  05/21/24 NuStep Level 5 6 min- PT present to discuss status Seated reclined crunch 5# DB 2 x 10 Seated ear to hip with 5# DB x 10 each side Standing on airex (hip abduction & extension)  2 x 10 bilateral  Seated bicips curls 3# 2x 10 Seated steam engines 3# x 10 bilateral  Seated hamstring curl with red loop 2 x 10 bilateral  Seated clam with red loop 2 x 10 Tandem stance + shoulder row with red TB x 10 each direction Tandem stance + shoulder extension  with red TB x 10 each direction Seated LAQ 3# AW 2 x 10 bilateral     05/14/24 NuStep Level 5 6 min- PT present to discuss status Seated chest press 5# DB 2 x 12 Seated reclined crunch 5# DB x 12 Sit to stand holding 5# DB x 12 Unilateral farmer's carry 5# DB x 2 laps down long hallway and back (rest break in between) using walking stick Seated bicips curls 3# 2x 10 Seated steam  engines 3# x 10 bilateral  Airex step ups + knee drive x 8 bilateral  Standing heel raise on airex 2 x 10 Seated clam with yellow loop 2 x 10     05/07/24 NuStep Level 5 6 min- PT present to discuss status 6in step ups (unilateral UE support) x 8B  Seated chest press 5# DB x 12 Sit to stand holding 5# DB 2 x 8  Seated steam engines 3# x 10 bilateral  Seated bicips curls 3# 2x 10 Standing march on airex x 10 bilateral Lateral step ups on airex x 8  Seated hamstring curl  with red TB x 10 bilateral  Seated clam with yellow loop 2 x 10 1 lap around both treatment gyms with walking stick Seated punch x 10 bilateral    PATIENT EDUCATION:  Education details: PT eval findings, anticipated POC, and HEP review   Person educated: Patient Education method: verbally discussed Education comprehension: verbalized understanding  HOME EXERCISE PROGRAM: Access Code: [email or web address] URL: [email or web address] Date: 04/15/2024 Prepared by: Mliss (previous HEP from recent episode)  Exercises - Sit to Stand with Arms Crossed  - 1 x daily - 5 x weekly - 2 sets - 10 reps - Standing Hamstring Curl with Chair Support  - 1 x daily - 7 x weekly - 3 sets - 10-15 reps - Standing March with Counter Support  - 1 x daily - 7 x weekly - 2-3 sets - 1 min hold - Side Stepping with Counter Support  - 1 x daily - 5 x weekly - 2 sets - 1 min hold - Forward Step Up  - 1 x daily - 5 x weekly - 2-3 sets - 30 sec hold - Walking with Eyes Closed and Counter Support  - 1 x daily - 5 x weekly - 2 sets - 5 reps - Standing Near Stance in Corner with Eyes Closed  - 1 x daily - 5 x weekly - 3 sets - 30 sec hold  ASSESSMENT:  CLINICAL IMPRESSION: Lindsay is responding to skilled therapy well. She is challenged by current level of exercise. She required occasional rest breaks after standing exercises. Educated patient on pursed lip breathing technique when feeling short of breath. Treatment session focused on  functional strengthening and balance. PT monitored patient fatigue and pain and adjusted as needed. Patient will benefit from skilled PT to address the below impairments and improve overall function.       Eval: Patient is  a 78 y.o. female who was seen today for physical therapy evaluation and treatment for osteoporosis and fall prevention . She was recently discharged from neuro PT due to complications from Afib. She has recently returned to work and has deficits with endurance with standing and walking  affecting work and grocery shopping. She also reports deficits with carrying over 10#. She denies pain. She continues to report balance deficits, but states her balance is much improved and it depends on the day. She will benefit from skilled PT to address these deficits and those listed below. Due to her RTW she will only be available 1x/wk.  OBJECTIVE IMPAIRMENTS: cardiopulmonary status limiting activity, decreased activity tolerance, decreased balance, difficulty walking, and decreased strength.   ACTIVITY LIMITATIONS: carrying, lifting, standing, and locomotion level  PARTICIPATION LIMITATIONS: laundry, shopping, and occupation  PERSONAL FACTORS: Age, Fitness, Past/current experiences, Time since onset of injury/illness/exacerbation, and 3+ comorbidities: OP, Afib, vertigo are also affecting patient's functional outcome.   REHAB POTENTIAL: Good  CLINICAL DECISION MAKING: Evolving/moderate complexity  EVALUATION COMPLEXITY: Moderate   GOALS: Goals reviewed with patient? Yes  SHORT TERM GOALS: Target date: 05/27/24  Ind with initial HEP Baseline: Goal status: INITIAL  2.  Decreased TUG by 2-3 seconds showing decreased risk for falls. Baseline: 13.77 sec Goal status: INITIAL  3.  Decreased 5XSTS by 2-3 seconds showing functional improvement in strength Baseline:  Goal status: INITIAL   LONG TERM GOALS: Target date: 07/08/24  Ind with advanced HEP Baseline:  Goal status:  INITIAL  2.  Improved average PSFS by 2 points showing functional improvement Baseline: 2.75 Goal status: INITIAL  3.  Patient to report improved endurance with grocery shopping by 50%  Baseline:  Goal status: INITIAL  4.  Improved to 1,100 ft or more with device as needed Baseline: 923 ft Goal status: INITIAL  5.  FGA to improve at least 4 points demonstrating decreased risk for falls Baseline: TBD Goal status: INITIAL  6.  Able to carry >= 10# x 25 ft showing improved functional UE strength Baseline:  Goal status: INITIAL  7. Improved standing tolerance to > 15-20 min without AD Baseline:  Goal status: INITIAL  PLAN:  PT FREQUENCY: 1x/week  PT DURATION: 12 weeks  PLANNED INTERVENTIONS: 97164- PT Re-evaluation, 97110-Therapeutic exercises, 97530- Therapeutic activity, [id number]- Neuromuscular re-education, 97535- Self Care, [id number]- Manual therapy, [phone]- Gait training, Patient/Family education, Balance training, and Stair training.  PLAN FOR NEXT SESSION: LAQ; continue carries; gait endurance, balance, functional LE and core strength for osteoporosis/balance   Kristeen Sar, PT 05/21/24 4:33 PM

## 2024-05-27 ENCOUNTER — Ambulatory Visit: Admitting: Physical Therapy

## 2024-05-27 DIAGNOSIS — Z79899 Other long term (current) drug therapy: Secondary | ICD-10-CM | POA: Diagnosis not present

## 2024-05-27 DIAGNOSIS — I4891 Unspecified atrial fibrillation: Secondary | ICD-10-CM | POA: Diagnosis not present

## 2024-05-28 ENCOUNTER — Telehealth: Payer: Self-pay

## 2024-05-28 LAB — DIGOXIN LEVEL: Digoxin, Serum: 0.4 ng/mL — ABNORMAL LOW (ref 0.5–0.9)

## 2024-05-28 NOTE — Telephone Encounter (Signed)
Work up complete. 

## 2024-05-28 NOTE — Telephone Encounter (Signed)
-----   Message from Nurse Carlyle C sent at 04/16/2024 12:02 PM EDT ----- Regarding: 11/07 afib ablation Precert:  MD: Kennyth Type of ablation: A-fib Diagnosis: AFIB CPT code: A-fib (06343) Ablation scheduled (date/time): 11/07 at 1230pm  Procedure:  Added to calendar? Yes Orders entered? Yes Letter complete? No, >30 days before procedure Scheduled with cath lab? Yes Any medications to hold? No Labs ordered (CBC, BMET, PT/INR if on warfarin): Yes Mapping system: CARTO (lab 4 or 6) CARTO/OPAL rep notified? No Cardiac CT needed: No Letters sent: Mailed H&P: 8/28 Device: No  Follow-up:  Cassie/Angel, please schedule Routine.

## 2024-06-04 ENCOUNTER — Ambulatory Visit

## 2024-06-04 DIAGNOSIS — R2689 Other abnormalities of gait and mobility: Secondary | ICD-10-CM | POA: Diagnosis not present

## 2024-06-04 DIAGNOSIS — M5459 Other low back pain: Secondary | ICD-10-CM

## 2024-06-04 DIAGNOSIS — R293 Abnormal posture: Secondary | ICD-10-CM | POA: Diagnosis not present

## 2024-06-04 DIAGNOSIS — R252 Cramp and spasm: Secondary | ICD-10-CM | POA: Diagnosis not present

## 2024-06-04 DIAGNOSIS — M6281 Muscle weakness (generalized): Secondary | ICD-10-CM

## 2024-06-04 DIAGNOSIS — R2681 Unsteadiness on feet: Secondary | ICD-10-CM

## 2024-06-04 DIAGNOSIS — R262 Difficulty in walking, not elsewhere classified: Secondary | ICD-10-CM | POA: Diagnosis not present

## 2024-06-04 NOTE — Therapy (Signed)
 OUTPATIENT PHYSICAL THERAPY THORACOLUMBAR TREATMENT   Patient Name: Michelle Wilcox MRN: [id number] DOB:14-Oct-1945, 78 y.o., female Today's Date: 06/04/2024  END OF SESSION:  PT End of Session - 06/04/24 1406     Visit Number 6    Date for Recertification  07/08/24    Authorization Type BCBS Medicare    PT Start Time 1406    PT Stop Time 1445    PT Time Calculation (min) 39 min    Activity Tolerance Patient tolerated treatment well    Behavior During Therapy WFL for tasks assessed/performed              Past Medical History:  Diagnosis Date   Anxiety    hx of   Asthma    at times   CHF (congestive heart failure) (HCC)    Depression    hx of   Dyspnea    Giant cell arteritis (HCC)    Gouty arthropathy    Hemolytic anemia    Hyperlipidemia    Hypertension    Irregular heart beat    extra beat   Migraines    Polymyalgia    Polymyalgia rheumatica    Temporal arteritis (HCC)    Past Surgical History:  Procedure Laterality Date   BARTHOLIN GLAND CYST EXCISION     non ca   BREAST BIOPSY Right    CARDIOVERSION N/A 11/05/2023   Procedure: CARDIOVERSION;  Surgeon: Sheena Pugh, DO;  Location: [facility];  Service: Cardiovascular;  Laterality: N/A;   CARDIOVERSION N/A 12/19/2023   Procedure: CARDIOVERSION;  Surgeon: Santo Stanly LABOR, MD;  Location: [facility];  Service: Cardiovascular;  Laterality: N/A;   EYE MUSCLE SURGERY     as a child 73 yrs old   KNEE SURGERY  2004   rt knee   LAPAROSCOPIC SPLENECTOMY N/A 06/18/2018   Procedure: LAPAROSCOPIC SPLENECTOMY ERAS PATHWAY;  Surgeon: Mikell Katz, MD;  Location: [facility];  Service: General;  Laterality: N/A;   RIGHT/LEFT HEART CATH AND CORONARY ANGIOGRAPHY N/A 03/04/2024   Procedure: RIGHT/LEFT HEART CATH AND CORONARY ANGIOGRAPHY;  Surgeon: Anner Alm ORN, MD;  Location: [facility];  Service: Cardiovascular;  Laterality: N/A;   toncil     TONSILLECTOMY     as a child    TRANSESOPHAGEAL ECHOCARDIOGRAM (CATH LAB) N/A 11/05/2023   Procedure: TRANSESOPHAGEAL ECHOCARDIOGRAM;  Surgeon: Sheena Pugh, DO;  Location: [facility];  Service: Cardiovascular;  Laterality: N/A;   TRANSESOPHAGEAL ECHOCARDIOGRAM (CATH LAB) N/A 12/19/2023   Procedure: TRANSESOPHAGEAL ECHOCARDIOGRAM;  Surgeon: Santo Stanly LABOR, MD;  Location: [facility];  Service: Cardiovascular;  Laterality: N/A;   Patient Active Problem List   Diagnosis Date Noted   Acute on chronic congestive heart failure (HCC) 12/24/2023   Acute on chronic HFrEF (heart failure with reduced ejection fraction) (HCC) 12/21/2023   Atrial fibrillation with rapid ventricular response (HCC) 12/21/2023   Atrial fibrillation, rapid (HCC) 12/21/2023   Pericardial effusion 11/07/2023   Atrial fibrillation with RVR (HCC) 11/05/2023   Borderline abnormal TFTs 11/05/2023   Macrocytosis 11/05/2023   Atrial fibrillation (HCC) 11/04/2023   PMR (polymyalgia rheumatica) 11/04/2023   Primary localized osteoarthrosis of multiple sites 06/15/2022   Diastolic dysfunction without heart failure 03/05/2018   DOE (dyspnea on exertion) 03/05/2018   Preop cardiovascular exam 03/05/2018   Mild reactive airways disease 08/04/2017   Volume overload 08/03/2017   Hypokalemia 08/03/2017   Essential hypertension 08/03/2017   Hyperlipidemia 08/03/2017   Autoimmune hemolytic anemia (  HCC) 11/20/2016   Hypersensitivity reaction 11/20/2016   Drusen (degenerative) of retina, bilateral 05/22/2016   Nuclear cataract 05/22/2016   Retinal hemorrhage of left eye 05/22/2016   Temporal arteritis (HCC) 05/22/2016   Hemolytic anemia 03/19/2016   Breast mass, right 07/01/2012    PCP: Aisha Harvey, MD'  REFERRING PROVIDER: Aisha Harvey, MD   REFERRING DIAG: M81.0 (ICD-10-CM) - Age-related osteoporosis without current pathological fracture   Rationale for Evaluation and Treatment: Rehabilitation  THERAPY DIAG:  Difficulty in  walking, not elsewhere classified  Abnormal posture  Other low back pain  Unsteadiness on feet  Muscle weakness (generalized)  Cramp and spasm  ONSET DATE: 11/04/23  SUBJECTIVE:                                                                                                                                                                                           SUBJECTIVE STATEMENT: Patient reports she is not having any pain right now.  (However, when we started Nustep she reported her right knee was hurting)  I had meniscus surgery many years ago and I need a knee replacement but I've got too many other things going on right now  Everything seems hard today     Eval: Here for OP ,She is still in Afib all the time (see history below). They want her to have an ablation now, before the end of the year. Seeing cardiologist tomorrow. She has been back to work since Aug 2 at [employer]. 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 Working 32 hours. Would like 1x/wk. She is challenged with back to back tours as she is talking. She still feels unsteady at times. It depends on the day. Standing is limited to 10 minutes due to fatigue.   PERTINENT HISTORY:   Vertigo, 12/31/2023:  Pt underwent attempt at cardioversion for a-fib on 12/19/23, which did not last >24 hrs; was hospitalized after ED visit for SOB, CHF.  Anxiety, asthma, CHF, depression, gouty arthropathy, anemia, HLD, HTN, migraines, polymyalgia rheumatica, temporal arteritis, R knee surgery   PAIN:  Are you having pain? No  PRECAUTIONS: None  RED FLAGS: None   WEIGHT BEARING RESTRICTIONS: No  FALLS:  Has patient fallen in last 6 months? No  LIVING ENVIRONMENT: Lives with: lives with their spouse who have stage 4 stomach CA (pt reports that she is having to physically assist him some) Lives in: Other townhouse  Stairs: 2 stories, sleeps on 1st floor in recliner  Has following equipment at home:  Vannie - 2 wheeled, cane  OCCUPATION: works 32 hours per week  PLOF: Independent with basic  ADLs, Independent with household mobility without device, Independent with community mobility with device, and Vocation/Vocational requirements: walking and talking  PATIENT GOALS: Help to do with osteoporosis and balance still  NEXT MD VISIT: unsure  OBJECTIVE:  Note: Objective measures were completed at Evaluation unless otherwise noted.  DIAGNOSTIC FINDINGS:  None recent  PATIENT SURVEYS:  PSFS: THE PATIENT SPECIFIC FUNCTIONAL SCALE  Place score of 0-10 (0 = unable to perform activity and 10 = able to perform activity at the same level as before injury or problem)  Activity Date: 04/15/24    Standing for conversation or tasks unassisted   2    2.endurance with walking   4    3. Managing grocery store runs (including unloading and putting things away)   3    4. Picking up 10# or more and carry   2    Total Score 2.75      Total Score = Sum of activity scores/number of activities  Minimally Detectable Change: 3 points (for single activity); 2 points (for average score)  [facility] (nd). The Patient Specific Functional Scale . Retrieved from [email or web address]   COGNITION: Overall cognitive status: Within functional limits for tasks assessed     SENSATION: WFL   LUMBAR ROM: WNL for tasks assessed   LOWER EXTREMITY ROM:    WNL for tasks assessed   LOWER EXTREMITY MMT:  4 to 5/5 B tested in sitting    FUNCTIONAL TESTS:  5 times sit to stand: 17.01 sec 6 minute walk test: 923 ft RPE 5/10 vitals PR 94 bpm, O2 sats 90% Functional gait assessment:   FUNCTIONAL GAIT ASSESSMENT  Date: 04/29/24 Score  GAIT LEVEL SURFACE Instructions: Walk at your normal speed from here to the next mark (6 m) [20 ft]. (2) Mild impairment - Walks 6 m (20 ft) in less than 7 seconds but greater than 5.5 seconds, uses assistive  device, slower speed, mild gait deviations, or deviates 15.24 -25.4 cm (6 -10 in) outside of the 30.48-cm (12-in) walkway width.  2.   CHANGE IN GAIT SPEED Instructions: Begin walking at your normal pace (for 1.5 m [5 ft]). When I tell you "go," walk as fast as you can (for 1.5 m [5 ft]). When I tell you "slow," walk as slowly as you can (for 1.5 m [5 ft]. (2) Mild impairment - Is able to change speed but demonstrates mild gait deviations, deviates 15.24 -25.4 cm (6 -10 in) outside of the 30.48-cm (12-in) walkway width, or no gait deviations but unable to achieve a significant change in velocity, or uses an assistive device  3.    GAIT WITH HORIZONTAL HEAD TURNS Instructions: Walk from here to the next mark 6 m (20 ft) away. Begin walking at your normal pace. Keep walking straight; after 3 steps, turn your head to the right and keep walking straight while looking to the right. After 3 more steps, turn your head to the left and keep walking straight while looking left. Continue alternating looking right and left. (2) Mild impairment - Performs head turns smoothly with slight change in gait velocity (eg, minor disruption to smooth gait path), deviates 15.24 -25.4 cm (6 -10 in) outside 30.48-cm (12-in) walkway width, or uses an assistive device.  4.   GAIT WITH VERTICAL HEAD TURNS Instructions: Walk from here to the next mark (6 m [20 ft]). Begin walking at your normal pace. Keep walking straight; after 3 steps, tip your head up and keep walking straight  while looking up. After 3 more steps, tip your head down, keep walking straight while looking down. Continue  alternating looking up and down every 3 steps until you have completed 2 repetitions in each direction. (2) Mild impairment - Performs task with slight change in gait velocity (eg, minor disruption to smooth gait path), deviates 15.24 -25.4 cm (6 -10 in) outside 30.48-cm (12-in) walkway width or uses assistive device.  5.  GAIT AND PIVOT  TURN Instructions: Begin with walking at your normal pace. When I tell you, "turn and stop," turn as quickly as you can to face the opposite direction and stop. (2) Mild impairment - Pivot turns safely in 3 seconds and stops with no loss of balance, or pivot turns safely within 3 seconds and stops with mild imbalance, requires small steps to catch balance  6.   STEP OVER OBSTACLE Instructions: Begin walking at your normal speed. When you come to the shoe box, step over it, not around it, and keep walking. (2) Mild impairment - Is able to step over one shoe box (11.43 cm [4.5 in] total height) without changing gait speed; no evidence of imbalance.  7.   GAIT WITH NARROW BASE OF SUPPORT Instructions: Walk on the floor with arms folded across the chest, feet aligned heel to toe in tandem for a distance of 3.6 m [12 ft]. The number of steps taken in a straight line are counted for a maximum of 10 steps. (2) Mild impairment - Ambulates 7-9 steps  8.   GAIT WITH EYES CLOSED Instructions: Walk at your normal speed from here to the next mark (6 m [20 ft]) with your eyes closed. (1) Moderate impairment - Walks 6 m (20 ft), slow speed, abnormal gait pattern, evidence for imbalance, deviates 25.4 -38.1 cm (10 -15 in) outside 30.48-cm (12-in) walkway width. Requires more than 9 seconds to ambulate 6 m (20 ft).  9.   AMBULATING BACKWARDS Instructions: Walk backwards until I tell you to stop (2) Mild impairment - Walks 6 m (20 ft), uses assistive device, slower speed, mild gait deviations, deviates 15.24 -25.4 cm (6 -10 in) outside 30.48-cm (12-in) walkway width  10. STEPS Instructions: Walk up these stairs as you would at home (ie, using the rail if necessary). At the top turn around and walk down. (1) Moderate impairment-Two feet to a stair; must use rail.  Total 18/30   Interpretation of scores: Non-Specific Older Adults Cutoff Score: <=22/30 = risk of falls Parkinson's Disease Cutoff score <15/30= fall risk  (Hoehn & Yahr 1-4)  Minimally Clinically Important Difference (MCID)  Stroke (acute, subacute, and chronic) = MDC: 4.2 points Vestibular (acute) = MDC: 6 points Community Dwelling Older Adults =  MCID: 4 points Parkinson's Disease  =  MDC: 4.3 points  (Academy of Neu[employer]ic Physical Therapy (nd). Functional Gait Assessment. Retrieved from https://www.neuropt.org/docs/default-source/cpgs/core-outcome-measures/function-gait-assessment-pocket-guide-proof9-(2).pdf?sfvrsn=b89f35043_0.)  Interpretation of scores: Non-Specific Older Adults Cutoff Score: <=22/30 = risk of falls Parkinson's Disease Cutoff score <15/30= fall risk (Hoehn & Yahr 1-4)  Minimally Clinically Important Difference (MCID)  Stroke (acute, subacute, and chronic) = MDC: 4.2 points Vestibular (acute) = MDC: 6 points Community Dwelling Older Adults =  MCID: 4 points Parkinson's Disease  =  MDC: 4.3 points  (Academy of Neurologic Physical Therapy (nd). Functional Gait Assessment. Retrieved from https://www.neuropt.org/docs/default-source/cpgs/core-outcome-measures/function-gait-assessment-pocket-guide-proof9-(2).pdf?sfvrsn=b22f35043_0.)  GAIT: Safe gait patten with walking stick  TREATMENT DATE:  06/04/24 NuStep Level 5 6 min- PT present to discuss status (patient started off at level 5, decreased to level 4 after about 1 min , then to level 3 at around 4 min, stopped completely at around 5 min I just can't do any more) Seated reclined crunch 5# kb 2 x 10 Seated ear to hip with 5# kb x 10 each side Seated modified Russian twist with 5#kb 2 x 10 Seated biceps curl 3# 2x 10 Seated steam engines 3# x 10 bilateral  Seated tricep press with yellow tband 2 x 10 each UE Seated hamstring curl with red tband 2 x 10 each LE Seated clam with red loop x 20 Seated LAQ 3# AW 2 x 10 bilateral  Standing on airex  (hip abduction & extension)  2 x 10 bilateral    05/21/24 NuStep Level 5 6 min- PT present to discuss status Seated reclined crunch 5# DB 2 x 10 Seated ear to hip with 5# DB x 10 each side Standing on airex (hip abduction & extension)  2 x 10 bilateral  Seated bicips curls 3# 2x 10 Seated steam engines 3# x 10 bilateral  Seated hamstring curl with red loop 2 x 10 bilateral  Seated clam with red loop 2 x 10 Tandem stance + shoulder row with red TB x 10 each direction Tandem stance + shoulder extension  with red TB x 10 each direction Seated LAQ 3# AW 2 x 10 bilateral     05/14/24 NuStep Level 5 6 min- PT present to discuss status Seated chest press 5# DB 2 x 12 Seated reclined crunch 5# DB x 12 Sit to stand holding 5# DB x 12 Unilateral farmer's carry 5# DB x 2 laps down long hallway and back (rest break in between) using walking stick Seated bicips curls 3# 2x 10 Seated steam engines 3# x 10 bilateral  Airex step ups + knee drive x 8 bilateral  Standing heel raise on airex 2 x 10 Seated clam with yellow loop 2 x 10    PATIENT EDUCATION:  Education details: PT eval findings, anticipated POC, and HEP review   Person educated: Patient Education method: verbally discussed Education comprehension: verbalized understanding  HOME EXERCISE PROGRAM: Access Code: [email or web address] URL: [email or web address] Date: 04/15/2024 Prepared by: Mliss (previous HEP from recent episode)  Exercises - Sit to Stand with Arms Crossed  - 1 x daily - 5 x weekly - 2 sets - 10 reps - Standing Hamstring Curl with Chair Support  - 1 x daily - 7 x weekly - 3 sets - 10-15 reps - Standing March with Counter Support  - 1 x daily - 7 x weekly - 2-3 sets - 1 min hold - Side Stepping with Counter Support  - 1 x daily - 5 x weekly - 2 sets - 1 min hold - Forward Step Up  - 1 x daily - 5 x weekly - 2-3 sets - 30 sec hold - Walking with Eyes Closed and Counter Support  - 1 x daily - 5 x weekly - 2 sets  - 5 reps - Standing Near Stance in Corner with Eyes Closed  - 1 x daily - 5 x weekly - 3 sets - 30 sec hold  ASSESSMENT:  CLINICAL IMPRESSION: Macie is progressing appropriately but struggled with cardiac activities today and needed frequent rest breaks.  She spoke of her foot pain today and we discussed neuropathy and how to manage.  Overall, she did well.  She is  well motivated and compliant.   Treatment session focused on functional strengthening and balance. PT monitored patient fatigue and pain and adjusted as needed. Patient will benefit from skilled PT to address the below impairments and improve overall function.       Eval: Patient is a 78 y.o. female who was seen today for physical therapy evaluation and treatment for osteoporosis and fall prevention . She was recently discharged from neuro PT due to complications from Afib. She has recently returned to work and has deficits with endurance with standing and walking  affecting work and grocery shopping. She also reports deficits with carrying over 10#. She denies pain. She continues to report balance deficits, but states her balance is much improved and it depends on the day. She will benefit from skilled PT to address these deficits and those listed below. Due to her RTW she will only be available 1x/wk.  OBJECTIVE IMPAIRMENTS: cardiopulmonary status limiting activity, decreased activity tolerance, decreased balance, difficulty walking, and decreased strength.   ACTIVITY LIMITATIONS: carrying, lifting, standing, and locomotion level  PARTICIPATION LIMITATIONS: laundry, shopping, and occupation  PERSONAL FACTORS: Age, Fitness, Past/current experiences, Time since onset of injury/illness/exacerbation, and 3+ comorbidities: OP, Afib, vertigo are also affecting patient's functional outcome.   REHAB POTENTIAL: Good  CLINICAL DECISION MAKING: Evolving/moderate complexity  EVALUATION COMPLEXITY: Moderate   GOALS: Goals reviewed with  patient? Yes  SHORT TERM GOALS: Target date: 05/27/24  Ind with initial HEP Baseline: Goal status: INITIAL  2.  Decreased TUG by 2-3 seconds showing decreased risk for falls. Baseline: 13.77 sec Goal status: INITIAL  3.  Decreased 5XSTS by 2-3 seconds showing functional improvement in strength Baseline:  Goal status: INITIAL   LONG TERM GOALS: Target date: 07/08/24  Ind with advanced HEP Baseline:  Goal status: INITIAL  2.  Improved average PSFS by 2 points showing functional improvement Baseline: 2.75 Goal status: INITIAL  3.  Patient to report improved endurance with grocery shopping by 50%  Baseline:  Goal status: INITIAL  4.  Improved to 1,100 ft or more with device as needed Baseline: 923 ft Goal status: INITIAL  5.  FGA to improve at least 4 points demonstrating decreased risk for falls Baseline: TBD Goal status: INITIAL  6.  Able to carry >= 10# x 25 ft showing improved functional UE strength Baseline:  Goal status: INITIAL  7. Improved standing tolerance to > 15-20 min without AD Baseline:  Goal status: INITIAL  PLAN:  PT FREQUENCY: 1x/week  PT DURATION: 12 weeks  PLANNED INTERVENTIONS: 97164- PT Re-evaluation, 97110-Therapeutic exercises, 97530- Therapeutic activity, [id number]- Neuromuscular re-education, 97535- Self Care, [id number]- Manual therapy, [phone]- Gait training, Patient/Family education, Balance training, and Stair training.  PLAN FOR NEXT SESSION: LAQ; continue carries; gait endurance, balance, functional LE and core strength for osteoporosis/balance   Richie Bonanno B. Calil Amor, PT 06/04/24 7:11 PM [facility] [address] [place], [place] [address] Phone # [phone] Fax [phone]

## 2024-06-05 ENCOUNTER — Telehealth: Payer: Self-pay | Admitting: Cardiology

## 2024-06-05 NOTE — Telephone Encounter (Signed)
 Left message for patient to call back.  Will let patient know when she calls back that her PCP will need to refill gabapentin , that this is not a cardiac medication.

## 2024-06-05 NOTE — Telephone Encounter (Signed)
*  STAT* If patient is at the pharmacy, call can be transferred to refill team.   1. Which medications need to be refilled? (please list name of each medication and dose if known) gabapentin  (NEURONTIN ) 100 MG capsule    2. Would you like to learn more about the convenience, safety, & potential cost savings by using the [facility]? No    3. Are you open to using the [facility] (Type [facility]. No    4. Which pharmacy/location (including street and city if local pharmacy) is medication to be sent to? [id number] - [place], [place] - [address]     5. Do they need a 30 day or 90 day supply? 90 day

## 2024-06-08 ENCOUNTER — Encounter (HOSPITAL_COMMUNITY): Payer: Self-pay

## 2024-06-08 ENCOUNTER — Telehealth (HOSPITAL_COMMUNITY): Payer: Self-pay

## 2024-06-08 NOTE — Telephone Encounter (Signed)
 Attempted to reach patient to discuss upcoming procedure, no answer. Left VM for patient to return call.

## 2024-06-09 ENCOUNTER — Telehealth: Payer: Self-pay | Admitting: Cardiology

## 2024-06-09 DIAGNOSIS — Z0000 Encounter for general adult medical examination without abnormal findings: Secondary | ICD-10-CM | POA: Diagnosis not present

## 2024-06-09 DIAGNOSIS — Z1331 Encounter for screening for depression: Secondary | ICD-10-CM | POA: Diagnosis not present

## 2024-06-09 NOTE — Telephone Encounter (Signed)
 Pt c/o BP issue: STAT if pt c/o blurred vision, one-sided weakness or slurred speech.  STAT if BP is GREATER than 180/120 TODAY.  STAT if BP is LESS than 90/60 and SYMPTOMATIC TODAY  1. What is your BP concern? High blood pressure  2. Have you taken any BP medication today?yes   3. What are your last 5 BP readings? 10/21 150/88 10/20 156/94, 124/78, 147/84 10/19 154/90, 120/76 10/18 150/99, 155/86 States she also has had a reading of 160/108 and the highest was 180/110.  4. Are you having any other symptoms (ex. Dizziness, headache, blurred vision, passed out)? States she is having no symptoms.   She feels she needs to having her medication adjusted.

## 2024-06-09 NOTE — Telephone Encounter (Signed)
 Patient left VM returning call. Attempted to reach patient back, but no answer. Left message to return call.

## 2024-06-09 NOTE — Telephone Encounter (Signed)
 Spoke to patient and she verified that she was having the elevated blood pressure. Pt reports that she is taking cardiac meds as prescribed and wonders if her metoprolol  should be increased? Pt is taking 1.5 tablets twice daily of metoprolol . Pt has a upcoming ablation and wants to make sure that her blood pressure is under control. Pt also wants to know if she needs to have digoxin  lab order placed as well?

## 2024-06-10 ENCOUNTER — Other Ambulatory Visit: Payer: Self-pay | Admitting: Oncology

## 2024-06-10 NOTE — Telephone Encounter (Signed)
 9/29 sent script for #90 with refills--call pharmacy to clarify

## 2024-06-10 NOTE — Telephone Encounter (Signed)
 Spoke with the patient and advised that Dr. Kennyth does not manage her blood pressure and does not recommend adjusting metoprolol  to help with her BP. Patient advised that she can speak with her PCP about additional BP meds or if she would prefer to see cardiology I can get her in with someone on Dr. Sibyl team. Patient will discuss with her PCP.

## 2024-06-11 ENCOUNTER — Telehealth: Payer: Self-pay

## 2024-06-11 ENCOUNTER — Ambulatory Visit

## 2024-06-11 DIAGNOSIS — I4891 Unspecified atrial fibrillation: Secondary | ICD-10-CM | POA: Diagnosis not present

## 2024-06-11 DIAGNOSIS — R2681 Unsteadiness on feet: Secondary | ICD-10-CM

## 2024-06-11 DIAGNOSIS — M6281 Muscle weakness (generalized): Secondary | ICD-10-CM | POA: Diagnosis not present

## 2024-06-11 DIAGNOSIS — M5459 Other low back pain: Secondary | ICD-10-CM

## 2024-06-11 DIAGNOSIS — F418 Other specified anxiety disorders: Secondary | ICD-10-CM | POA: Diagnosis not present

## 2024-06-11 DIAGNOSIS — R252 Cramp and spasm: Secondary | ICD-10-CM | POA: Diagnosis not present

## 2024-06-11 DIAGNOSIS — R262 Difficulty in walking, not elsewhere classified: Secondary | ICD-10-CM

## 2024-06-11 DIAGNOSIS — R293 Abnormal posture: Secondary | ICD-10-CM | POA: Diagnosis not present

## 2024-06-11 DIAGNOSIS — J9801 Acute bronchospasm: Secondary | ICD-10-CM | POA: Diagnosis not present

## 2024-06-11 DIAGNOSIS — R2689 Other abnormalities of gait and mobility: Secondary | ICD-10-CM | POA: Diagnosis not present

## 2024-06-11 DIAGNOSIS — R0989 Other specified symptoms and signs involving the circulatory and respiratory systems: Secondary | ICD-10-CM | POA: Diagnosis not present

## 2024-06-11 NOTE — Telephone Encounter (Signed)
 The patient called to inform us  that she has received a 90-day supply of prednisone . I followed up with the pharmacy, and they confirmed that the medication has not been provided. I attempted to contact the patient to notify her that the prescription will be ready shortly; however, there was no answer.

## 2024-06-11 NOTE — Telephone Encounter (Signed)
 Patient identification verified by 2 forms.   Called and spoke to patient  Patient states:  -She is having labs drawn today and would like to know if she needs a Digoxin  level drawn as well.  -Spoke with provider's nurse. Digoxin  order does not need to be drawn since it was last drawn on 10/8.

## 2024-06-11 NOTE — Telephone Encounter (Signed)
 Pt requesting a c/b in regards to previous phone note.

## 2024-06-11 NOTE — Therapy (Signed)
 OUTPATIENT PHYSICAL THERAPY THORACOLUMBAR TREATMENT   Patient Name: Michelle Wilcox MRN: [id number] DOB:[birth year], 78 y.o., female Today's Date: 06/11/2024  END OF SESSION:  PT End of Session - 06/11/24 0932     Visit Number 7    Date for Recertification  07/08/24    Authorization Type BCBS Medicare    Progress Note Due on Visit 10    PT Start Time 0932    PT Stop Time 1017    PT Time Calculation (min) 45 min    Activity Tolerance Patient tolerated treatment well    Behavior During Therapy WFL for tasks assessed/performed              Past Medical History:  Diagnosis Date   Anxiety    hx of   Asthma    at times   CHF (congestive heart failure) (HCC)    Depression    hx of   Dyspnea    Giant cell arteritis (HCC)    Gouty arthropathy    Hemolytic anemia    Hyperlipidemia    Hypertension    Irregular heart beat    extra beat   Migraines    Polymyalgia    Polymyalgia rheumatica    Temporal arteritis (HCC)    Past Surgical History:  Procedure Laterality Date   BARTHOLIN GLAND CYST EXCISION     non ca   BREAST BIOPSY Right    CARDIOVERSION N/A 11/05/2023   Procedure: CARDIOVERSION;  Surgeon: Sheena Pugh, DO;  Location: [facility];  Service: Cardiovascular;  Laterality: N/A;   CARDIOVERSION N/A 12/19/2023   Procedure: CARDIOVERSION;  Surgeon: Santo Stanly LABOR, MD;  Location: [facility];  Service: Cardiovascular;  Laterality: N/A;   EYE MUSCLE SURGERY     as a child 44 yrs old   KNEE SURGERY  2004   rt knee   LAPAROSCOPIC SPLENECTOMY N/A 06/18/2018   Procedure: LAPAROSCOPIC SPLENECTOMY ERAS PATHWAY;  Surgeon: Mikell Katz, MD;  Location: [facility];  Service: General;  Laterality: N/A;   RIGHT/LEFT HEART CATH AND CORONARY ANGIOGRAPHY N/A 03/04/2024   Procedure: RIGHT/LEFT HEART CATH AND CORONARY ANGIOGRAPHY;  Surgeon: Anner Alm ORN, MD;  Location: [facility];  Service: Cardiovascular;  Laterality: N/A;   toncil      TONSILLECTOMY     as a child   TRANSESOPHAGEAL ECHOCARDIOGRAM (CATH LAB) N/A 11/05/2023   Procedure: TRANSESOPHAGEAL ECHOCARDIOGRAM;  Surgeon: Sheena Pugh, DO;  Location: [facility];  Service: Cardiovascular;  Laterality: N/A;   TRANSESOPHAGEAL ECHOCARDIOGRAM (CATH LAB) N/A 12/19/2023   Procedure: TRANSESOPHAGEAL ECHOCARDIOGRAM;  Surgeon: Santo Stanly LABOR, MD;  Location: [facility];  Service: Cardiovascular;  Laterality: N/A;   Patient Active Problem List   Diagnosis Date Noted   Acute on chronic congestive heart failure (HCC) 12/24/2023   Acute on chronic HFrEF (heart failure with reduced ejection fraction) (HCC) 12/21/2023   Atrial fibrillation with rapid ventricular response (HCC) 12/21/2023   Atrial fibrillation, rapid (HCC) 12/21/2023   Pericardial effusion 11/07/2023   Atrial fibrillation with RVR (HCC) 11/05/2023   Borderline abnormal TFTs 11/05/2023   Macrocytosis 11/05/2023   Atrial fibrillation (HCC) 11/04/2023   PMR (polymyalgia rheumatica) 11/04/2023   Primary localized osteoarthrosis of multiple sites 06/15/2022   Diastolic dysfunction without heart failure 03/05/2018   DOE (dyspnea on exertion) 03/05/2018   Preop cardiovascular exam 03/05/2018   Mild reactive airways disease 08/04/2017   Volume overload 08/03/2017   Hypokalemia 08/03/2017   Essential hypertension 08/03/2017  Hyperlipidemia 08/03/2017   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   Hypersensitivity reaction 11/20/2016   Drusen (degenerative) of retina, bilateral 05/22/2016   Nuclear cataract 05/22/2016   Retinal hemorrhage of left eye 05/22/2016   Temporal arteritis (HCC) 05/22/2016   Hemolytic anemia 03/19/2016   Breast mass, right 07/01/2012    PCP: Aisha Harvey, MD'  REFERRING PROVIDER: Aisha Harvey, MD   REFERRING DIAG: M81.0 (ICD-10-CM) - Age-related osteoporosis without current pathological fracture   Rationale for Evaluation and Treatment:  Rehabilitation  THERAPY DIAG:  Difficulty in walking, not elsewhere classified  Abnormal posture  Other low back pain  Unsteadiness on feet  Muscle weakness (generalized)  Cramp and spasm  ONSET DATE: 11/04/23  SUBJECTIVE:                                                                                                                                                                                           SUBJECTIVE STATEMENT: Patient reports her ablation is scheduled for November 7th.  Her BP has been elevated for several weeks.    Eval: Here for OP ,She is still in Afib all the time (see history below). They want her to have an ablation now, before the end of the year. Seeing cardiologist tomorrow. She has been back to work since Aug 2 at [employer]. 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 Working 32 hours. Would like 1x/wk. She is challenged with back to back tours as she is talking. She still feels unsteady at times. It depends on the day. Standing is limited to 10 minutes due to fatigue.   PERTINENT HISTORY:   Vertigo, 12/31/2023:  Pt underwent attempt at cardioversion for a-fib on 12/19/23, which did not last >24 hrs; was hospitalized after ED visit for SOB, CHF.  Anxiety, asthma, CHF, depression, gouty arthropathy, anemia, HLD, HTN, migraines, polymyalgia rheumatica, temporal arteritis, R knee surgery   PAIN:  Are you having pain? No  PRECAUTIONS: None  RED FLAGS: None   WEIGHT BEARING RESTRICTIONS: No  FALLS:  Has patient fallen in last 6 months? No  LIVING ENVIRONMENT: Lives with: lives with their spouse who have stage 4 stomach CA (pt reports that she is having to physically assist him some) Lives in: Other townhouse  Stairs: 2 stories, sleeps on 1st floor in recliner  Has following equipment at home: Environmental consultant - 2 wheeled, cane  OCCUPATION: works 32 hours per week  PLOF: Independent with basic ADLs, Independent with household mobility  without device, Independent with community mobility with device, and Vocation/Vocational requirements: walking and talking  PATIENT GOALS: Help to do with osteoporosis and balance  still  NEXT MD VISIT: unsure  OBJECTIVE:  Note: Objective measures were completed at Evaluation unless otherwise noted.  DIAGNOSTIC FINDINGS:  None recent  PATIENT SURVEYS:  PSFS: THE PATIENT SPECIFIC FUNCTIONAL SCALE  Place score of 0-10 (0 = unable to perform activity and 10 = able to perform activity at the same level as before injury or problem)  Activity Date: 04/15/24 Date: 06/11/24   Standing for conversation or tasks unassisted   2     5   2.endurance with walking   4     6   3. Managing grocery store runs (including unloading and putting things away)   3     5   4. Picking up 10# or more and carry   2     5   Total Score 2.75 5.25     Total Score = Sum of activity scores/number of activities  Minimally Detectable Change: 3 points (for single activity); 2 points (for average score)  [facility] (nd). The Patient Specific Functional Scale . Retrieved from [email or web address]   COGNITION: Overall cognitive status: Within functional limits for tasks assessed     SENSATION: WFL   LUMBAR ROM: WNL for tasks assessed   LOWER EXTREMITY ROM:    WNL for tasks assessed   LOWER EXTREMITY MMT:  4 to 5/5 B tested in sitting    FUNCTIONAL TESTS:  5 times sit to stand: 17.01 sec 6 minute walk test: 923 ft RPE 5/10 vitals PR 94 bpm, O2 sats 90% Functional gait assessment:   FUNCTIONAL GAIT ASSESSMENT  Date: 04/29/24 Score  GAIT LEVEL SURFACE Instructions: Walk at your normal speed from here to the next mark (6 m) [20 ft]. (2) Mild impairment - Walks 6 m (20 ft) in less than 7 seconds but greater than 5.5 seconds, uses assistive device, slower speed, mild gait deviations, or deviates 15.24 -25.4 cm (6 -10 in) outside of the  30.48-cm (12-in) walkway width.  2.   CHANGE IN GAIT SPEED Instructions: Begin walking at your normal pace (for 1.5 m [5 ft]). When I tell you "go," walk as fast as you can (for 1.5 m [5 ft]). When I tell you "slow," walk as slowly as you can (for 1.5 m [5 ft]. (2) Mild impairment - Is able to change speed but demonstrates mild gait deviations, deviates 15.24 -25.4 cm (6 -10 in) outside of the 30.48-cm (12-in) walkway width, or no gait deviations but unable to achieve a significant change in velocity, or uses an assistive device  3.    GAIT WITH HORIZONTAL HEAD TURNS Instructions: Walk from here to the next mark 6 m (20 ft) away. Begin walking at your normal pace. Keep walking straight; after 3 steps, turn your head to the right and keep walking straight while looking to the right. After 3 more steps, turn your head to the left and keep walking straight while looking left. Continue alternating looking right and left. (2) Mild impairment - Performs head turns smoothly with slight change in gait velocity (eg, minor disruption to smooth gait path), deviates 15.24 -25.4 cm (6 -10 in) outside 30.48-cm (12-in) walkway width, or uses an assistive device.  4.   GAIT WITH VERTICAL HEAD TURNS Instructions: Walk from here to the next mark (6 m [20 ft]). Begin walking at your normal pace. Keep walking straight; after 3 steps, tip your head up and keep walking straight while looking up. After 3 more steps, tip your head down, keep  walking straight while looking down. Continue  alternating looking up and down every 3 steps until you have completed 2 repetitions in each direction. (2) Mild impairment - Performs task with slight change in gait velocity (eg, minor disruption to smooth gait path), deviates 15.24 -25.4 cm (6 -10 in) outside 30.48-cm (12-in) walkway width or uses assistive device.  5.  GAIT AND PIVOT TURN Instructions: Begin with walking at your normal pace. When I tell you, "turn and stop," turn as quickly as  you can to face the opposite direction and stop. (2) Mild impairment - Pivot turns safely in 3 seconds and stops with no loss of balance, or pivot turns safely within 3 seconds and stops with mild imbalance, requires small steps to catch balance  6.   STEP OVER OBSTACLE Instructions: Begin walking at your normal speed. When you come to the shoe box, step over it, not around it, and keep walking. (2) Mild impairment - Is able to step over one shoe box (11.43 cm [4.5 in] total height) without changing gait speed; no evidence of imbalance.  7.   GAIT WITH NARROW BASE OF SUPPORT Instructions: Walk on the floor with arms folded across the chest, feet aligned heel to toe in tandem for a distance of 3.6 m [12 ft]. The number of steps taken in a straight line are counted for a maximum of 10 steps. (2) Mild impairment - Ambulates 7-9 steps  8.   GAIT WITH EYES CLOSED Instructions: Walk at your normal speed from here to the next mark (6 m [20 ft]) with your eyes closed. (1) Moderate impairment - Walks 6 m (20 ft), slow speed, abnormal gait pattern, evidence for imbalance, deviates 25.4 -38.1 cm (10 -15 in) outside 30.48-cm (12-in) walkway width. Requires more than 9 seconds to ambulate 6 m (20 ft).  9.   AMBULATING BACKWARDS Instructions: Walk backwards until I tell you to stop (2) Mild impairment - Walks 6 m (20 ft), uses assistive device, slower speed, mild gait deviations, deviates 15.24 -25.4 cm (6 -10 in) outside 30.48-cm (12-in) walkway width  10. STEPS Instructions: Walk up these stairs as you would at home (ie, using the rail if necessary). At the top turn around and walk down. (1) Moderate impairment-Two feet to a stair; must use rail.  Total 18/30   Interpretation of scores: Non-Specific Older Adults Cutoff Score: <=22/30 = risk of falls Parkinson's Disease Cutoff score <15/30= fall risk (Hoehn & Yahr 1-4)  Minimally Clinically Important Difference (MCID)  Stroke (acute, subacute, and chronic) =  MDC: 4.2 points Vestibular (acute) = MDC: 6 points Community Dwelling Older Adults =  MCID: 4 points Parkinson's Disease  =  MDC: 4.3 points  (Academy of [employer]ic [employer] (nd). Functional Gait Assessment. Retrieved from https://www.neuropt.org/docs/default-source/cpgs/core-outcome-measures/function-gait-assessment-pocket-guide-proof9-(2).pdf?sfvrsn=b50f35043_0.)  Interpretation of scores: Non-Specific Older Adults Cutoff Score: <=22/30 = risk of falls Parkinson's Disease Cutoff score <15/30= fall risk (Hoehn & Yahr 1-4)  Minimally Clinically Important Difference (MCID)  Stroke (acute, subacute, and chronic) = MDC: 4.2 points Vestibular (acute) = MDC: 6 points Community Dwelling Older Adults =  MCID: 4 points Parkinson's Disease  =  MDC: 4.3 points  (Academy of Neurologic Physical Therapy (nd). Functional Gait Assessment. Retrieved from https://www.neuropt.org/docs/default-source/cpgs/core-outcome-measures/function-gait-assessment-pocket-guide-proof9-(2).pdf?sfvrsn=b58f35043_0.)  GAIT: Safe gait patten with walking stick  TREATMENT DATE:  06/11/24 NuStep Level 5  x5 min- PT present to discuss status Seated reclined crunch 6# db 2 x 10 Seated ear to hip with 6# db x 10 each side Patient received phone call during treatment from provider and had to take this call due to needing to receive details of her upcoming ablation Seated modified Guernsey twist with 6#db 2 x 10 Seated biceps curl 3# 2x 10 Seated steam engines 3# 2 x 10 bilateral  Seated tricep press with yellow tband 2 x 10 each UE Seated hamstring curl with red tband 2 x 10 each LE Seated clam with red loop x 20 Seated LAQ 3# AW 2 x 10 bilateral  Seated march with 3# x 20   06/04/24 NuStep Level 5 6 min- PT present to discuss status (patient started off at level 5, decreased to level 4 after  about 1 min , then to level 3 at around 4 min, stopped completely at around 5 min I just can't do any more) Seated reclined crunch 5# kb 2 x 10 Seated ear to hip with 5# kb x 10 each side Seated modified Russian twist with 5#kb 2 x 10 Seated biceps curl 3# 2x 10 Seated steam engines 3# x 10 bilateral  Seated tricep press with yellow tband 2 x 10 each UE Seated hamstring curl with red tband 2 x 10 each LE Seated clam with red loop x 20 Seated LAQ 3# AW 2 x 10 bilateral  Standing on airex (hip abduction & extension)  2 x 10 bilateral    05/21/24 NuStep Level 5 6 min- PT present to discuss status Seated reclined crunch 5# DB 2 x 10 Seated ear to hip with 5# DB x 10 each side Standing on airex (hip abduction & extension)  2 x 10 bilateral  Seated bicips curls 3# 2x 10 Seated steam engines 3# x 10 bilateral  Seated hamstring curl with red loop 2 x 10 bilateral  Seated clam with red loop 2 x 10 Tandem stance + shoulder row with red TB x 10 each direction Tandem stance + shoulder extension  with red TB x 10 each direction Seated LAQ 3# AW 2 x 10 bilateral     05/14/24 NuStep Level 5 6 min- PT present to discuss status Seated chest press 5# DB 2 x 12 Seated reclined crunch 5# DB x 12 Sit to stand holding 5# DB x 12 Unilateral farmer's carry 5# DB x 2 laps down long hallway and back (rest break in between) using walking stick Seated bicips curls 3# 2x 10 Seated steam engines 3# x 10 bilateral  Airex step ups + knee drive x 8 bilateral  Standing heel raise on airex 2 x 10 Seated clam with yellow loop 2 x 10    PATIENT EDUCATION:  Education details: PT eval findings, anticipated POC, and HEP review   Person educated: Patient Education method: verbally discussed Education comprehension: verbalized understanding  HOME EXERCISE PROGRAM: Access Code: [email or web address] URL: [email or web address] Date: 04/15/2024 Prepared by: Mliss (previous HEP from recent  episode)  Exercises - Sit to Stand with Arms Crossed  - 1 x daily - 5 x weekly - 2 sets - 10 reps - Standing Hamstring Curl with Chair Support  - 1 x daily - 7 x weekly - 3 sets - 10-15 reps - Standing March with Counter Support  - 1 x daily - 7 x weekly - 2-3 sets - 1 min hold - Side Stepping with Counter Support  - 1  x daily - 5 x weekly - 2 sets - 1 min hold - Forward Step Up  - 1 x daily - 5 x weekly - 2-3 sets - 30 sec hold - Walking with Eyes Closed and Counter Support  - 1 x daily - 5 x weekly - 2 sets - 5 reps - Standing Near Stance in Corner with Eyes Closed  - 1 x daily - 5 x weekly - 3 sets - 30 sec hold  ASSESSMENT:  CLINICAL IMPRESSION: Michelle Wilcox is still struggling with her cardiac issues but has ablation scheduled soon. She was able to tolerate increased resistance on core work.  We kept everything in sitting today due to her cardiac status.  We scheduled more appointments through November 13 and will re-assess at that time for recert.  Patient will benefit from skilled PT to address the below impairments and improve overall function.   Eval: Patient is a 78 y.o. female who was seen today for physical therapy evaluation and treatment for osteoporosis and fall prevention . She was recently discharged from neuro PT due to complications from Afib. She has recently returned to work and has deficits with endurance with standing and walking  affecting work and grocery shopping. She also reports deficits with carrying over 10#. She denies pain. She continues to report balance deficits, but states her balance is much improved and it depends on the day. She will benefit from skilled PT to address these deficits and those listed below. Due to her RTW she will only be available 1x/wk.  OBJECTIVE IMPAIRMENTS: cardiopulmonary status limiting activity, decreased activity tolerance, decreased balance, difficulty walking, and decreased strength.   ACTIVITY LIMITATIONS: carrying, lifting, standing,  and locomotion level  PARTICIPATION LIMITATIONS: laundry, shopping, and occupation  PERSONAL FACTORS: Age, Fitness, Past/current experiences, Time since onset of injury/illness/exacerbation, and 3+ comorbidities: OP, Afib, vertigo are also affecting patient's functional outcome.   REHAB POTENTIAL: Good  CLINICAL DECISION MAKING: Evolving/moderate complexity  EVALUATION COMPLEXITY: Moderate   GOALS: Goals reviewed with patient? Yes  SHORT TERM GOALS: Target date: 05/27/24  Ind with initial HEP Baseline: Goal status: MET 06/11/24  2.  Decreased TUG by 2-3 seconds showing decreased risk for falls. Baseline: 13.77 sec Goal status: In progress  3.  Decreased 5XSTS by 2-3 seconds showing functional improvement in strength Baseline:  Goal status: In progress   LONG TERM GOALS: Target date: 07/08/24  Ind with advanced HEP Baseline:  Goal status: INITIAL  2.  Improved average PSFS by 2 points showing functional improvement Baseline: 2.75 Goal status: MET 06/11/24  3.  Patient to report improved endurance with grocery shopping by 50%  Baseline:  Goal status: INITIAL  4.  Improved to 1,100 ft or more with device as needed Baseline: 923 ft Goal status: INITIAL  5.  FGA to improve at least 4 points demonstrating decreased risk for falls Baseline: TBD Goal status: INITIAL  6.  Able to carry >= 10# x 25 ft showing improved functional UE strength Baseline:  Goal status: INITIAL  7. Improved standing tolerance to > 15-20 min without AD Baseline:  Goal status: INITIAL  PLAN:  PT FREQUENCY: 1x/week  PT DURATION: 12 weeks  PLANNED INTERVENTIONS: 97164- PT Re-evaluation, 97110-Therapeutic exercises, 97530- Therapeutic activity, [id number]- Neuromuscular re-education, 97535- Self Care, [id number]- Manual therapy, [phone]- Gait training, Patient/Family education, Balance training, and Stair training.  PLAN FOR NEXT SESSION: Gait endurance, balance, functional LE and core  strength for osteoporosis/balance   Taila Basinski B. Tisha Cline, PT 06/11/24 5:54  PM [facility] [address] [place], [place] [address] Phone # [phone] Fax [phone]

## 2024-06-12 LAB — BASIC METABOLIC PANEL WITH GFR
BUN/Creatinine Ratio: 22 (ref 12–28)
BUN: 17 mg/dL (ref 8–27)
CO2: 23 mmol/L (ref 20–29)
Calcium: 9.2 mg/dL (ref 8.7–10.3)
Chloride: 101 mmol/L (ref 96–106)
Creatinine, Ser: 0.77 mg/dL (ref 0.57–1.00)
Glucose: 156 mg/dL — ABNORMAL HIGH (ref 70–99)
Potassium: 4.8 mmol/L (ref 3.5–5.2)
Sodium: 142 mmol/L (ref 134–144)
eGFR: 78 mL/min/1.73 (ref >59)

## 2024-06-12 LAB — CBC
Hematocrit: 42.9 % (ref 34.0–46.6)
Hemoglobin: 13.8 g/dL (ref 11.1–15.9)
MCH: 31.9 pg (ref 26.6–33.0)
MCHC: 32.2 g/dL (ref 31.5–35.7)
MCV: 99 fL — ABNORMAL HIGH (ref 79–97)
Platelets: 322 x10E3/uL (ref 150–450)
RBC: 4.33 x10E6/uL (ref 3.77–5.28)
RDW: 13 % (ref 11.7–15.4)
WBC: 10.7 x10E3/uL (ref 3.4–10.8)

## 2024-06-13 ENCOUNTER — Ambulatory Visit: Payer: Self-pay | Admitting: Cardiology

## 2024-06-16 ENCOUNTER — Ambulatory Visit

## 2024-06-16 DIAGNOSIS — R262 Difficulty in walking, not elsewhere classified: Secondary | ICD-10-CM

## 2024-06-16 DIAGNOSIS — R2681 Unsteadiness on feet: Secondary | ICD-10-CM | POA: Diagnosis not present

## 2024-06-16 DIAGNOSIS — M5459 Other low back pain: Secondary | ICD-10-CM

## 2024-06-16 DIAGNOSIS — M6281 Muscle weakness (generalized): Secondary | ICD-10-CM

## 2024-06-16 DIAGNOSIS — R293 Abnormal posture: Secondary | ICD-10-CM

## 2024-06-16 DIAGNOSIS — R252 Cramp and spasm: Secondary | ICD-10-CM

## 2024-06-16 DIAGNOSIS — R2689 Other abnormalities of gait and mobility: Secondary | ICD-10-CM | POA: Diagnosis not present

## 2024-06-16 NOTE — Therapy (Signed)
 OUTPATIENT PHYSICAL THERAPY THORACOLUMBAR TREATMENT   Patient Name: [name] MRN: [id number] DOB:1946/06/17, 78 y.o., female Today's Date: 06/16/2024  END OF SESSION:  PT End of Session - 06/16/24 0902     Visit Number 8    Date for Recertification  07/08/24    Authorization Type BCBS Medicare    Progress Note Due on Visit 10    PT Start Time [phone]    PT Stop Time 0930    PT Time Calculation (min) 32 min    Activity Tolerance Patient tolerated treatment well    Behavior During Therapy WFL for tasks assessed/performed              Past Medical History:  Diagnosis Date   Anxiety    hx of   Asthma    at times   CHF (congestive heart failure) (HCC)    Depression    hx of   Dyspnea    Giant cell arteritis (HCC)    Gouty arthropathy    Hemolytic anemia    Hyperlipidemia    Hypertension    Irregular heart beat    extra beat   Migraines    Polymyalgia    Polymyalgia rheumatica    Temporal arteritis (HCC)    Past Surgical History:  Procedure Laterality Date   BARTHOLIN GLAND CYST EXCISION     non ca   BREAST BIOPSY Right    CARDIOVERSION N/A 11/05/2023   Procedure: CARDIOVERSION;  Surgeon: Sheena Pugh, DO;  Location: [facility];  Service: Cardiovascular;  Laterality: N/A;   CARDIOVERSION N/A 12/19/2023   Procedure: CARDIOVERSION;  Surgeon: Santo Stanly LABOR, MD;  Location: [facility];  Service: Cardiovascular;  Laterality: N/A;   EYE MUSCLE SURGERY     as a child 89 yrs old   KNEE SURGERY  2004   rt knee   LAPAROSCOPIC SPLENECTOMY N/A 06/18/2018   Procedure: LAPAROSCOPIC SPLENECTOMY ERAS PATHWAY;  Surgeon: Mikell Katz, MD;  Location: [facility];  Service: General;  Laterality: N/A;   RIGHT/LEFT HEART CATH AND CORONARY ANGIOGRAPHY N/A 03/04/2024   Procedure: RIGHT/LEFT HEART CATH AND CORONARY ANGIOGRAPHY;  Surgeon: Anner Alm ORN, MD;  Location: [facility];  Service: Cardiovascular;  Laterality: N/A;   toncil      TONSILLECTOMY     as a child   TRANSESOPHAGEAL ECHOCARDIOGRAM (CATH LAB) N/A 11/05/2023   Procedure: TRANSESOPHAGEAL ECHOCARDIOGRAM;  Surgeon: Sheena Pugh, DO;  Location: [facility];  Service: Cardiovascular;  Laterality: N/A;   TRANSESOPHAGEAL ECHOCARDIOGRAM (CATH LAB) N/A 12/19/2023   Procedure: TRANSESOPHAGEAL ECHOCARDIOGRAM;  Surgeon: Santo Stanly LABOR, MD;  Location: [facility];  Service: Cardiovascular;  Laterality: N/A;   Patient Active Problem List   Diagnosis Date Noted   Acute on chronic congestive heart failure (HCC) 12/24/2023   Acute on chronic HFrEF (heart failure with reduced ejection fraction) (HCC) 12/21/2023   Atrial fibrillation with rapid ventricular response (HCC) 12/21/2023   Atrial fibrillation, rapid (HCC) 12/21/2023   Pericardial effusion 11/07/2023   Atrial fibrillation with RVR (HCC) 11/05/2023   Borderline abnormal TFTs 11/05/2023   Macrocytosis 11/05/2023   Atrial fibrillation (HCC) 11/04/2023   PMR (polymyalgia rheumatica) 11/04/2023   Primary localized osteoarthrosis of multiple sites 06/15/2022   Diastolic dysfunction without heart failure 03/05/2018   DOE (dyspnea on exertion) 03/05/2018   Preop cardiovascular exam 03/05/2018   Mild reactive airways disease 08/04/2017   Volume overload 08/03/2017   Hypokalemia 08/03/2017   Essential hypertension 08/03/2017  Hyperlipidemia 08/03/2017   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   Hypersensitivity reaction 11/20/2016   Drusen (degenerative) of retina, bilateral 05/22/2016   Nuclear cataract 05/22/2016   Retinal hemorrhage of left eye 05/22/2016   Temporal arteritis (HCC) 05/22/2016   Hemolytic anemia 03/19/2016   Breast mass, right 07/01/2012    PCP: Aisha Harvey, MD'  REFERRING PROVIDER: Aisha Harvey, MD   REFERRING DIAG: M81.0 (ICD-10-CM) - Age-related osteoporosis without current pathological fracture   Rationale for Evaluation and Treatment:  Rehabilitation  THERAPY DIAG:  Muscle weakness (generalized)  Other low back pain  Abnormal posture  Cramp and spasm  Difficulty in walking, not elsewhere classified  Unsteadiness on feet  ONSET DATE: 11/04/23  SUBJECTIVE:                                                                                                                                                                                           SUBJECTIVE STATEMENT: Patient reports doing ok today.      Eval: Here for OP ,She is still in Afib all the time (see history below). They want her to have an ablation now, before the end of the year. Seeing cardiologist tomorrow. She has been back to work since Aug 2 at [employer]. 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 Working 32 hours. Would like 1x/wk. She is challenged with back to back tours as she is talking. She still feels unsteady at times. It depends on the day. Standing is limited to 10 minutes due to fatigue.   PERTINENT HISTORY:   Vertigo, 12/31/2023:  Pt underwent attempt at cardioversion for a-fib on 12/19/23, which did not last >24 hrs; was hospitalized after ED visit for SOB, CHF.  Anxiety, asthma, CHF, depression, gouty arthropathy, anemia, HLD, HTN, migraines, polymyalgia rheumatica, temporal arteritis, R knee surgery   PAIN:  Are you having pain? No  PRECAUTIONS: None  RED FLAGS: None   WEIGHT BEARING RESTRICTIONS: No  FALLS:  Has patient fallen in last 6 months? No  LIVING ENVIRONMENT: Lives with: lives with their spouse who have stage 4 stomach CA (pt reports that she is having to physically assist him some) Lives in: Other townhouse  Stairs: 2 stories, sleeps on 1st floor in recliner  Has following equipment at home: Environmental Consultant - 2 wheeled, cane  OCCUPATION: works 32 hours per week  PLOF: Independent with basic ADLs, Independent with household mobility without device, Independent with community mobility with device, and  Vocation/Vocational requirements: walking and talking  PATIENT GOALS: Help to do with osteoporosis and balance still  NEXT MD VISIT: unsure  OBJECTIVE:  Note: Objective  measures were completed at Evaluation unless otherwise noted.  DIAGNOSTIC FINDINGS:  None recent  PATIENT SURVEYS:  PSFS: THE PATIENT SPECIFIC FUNCTIONAL SCALE  Place score of 0-10 (0 = unable to perform activity and 10 = able to perform activity at the same level as before injury or problem)  Activity Date: 04/15/24 Date: 06/11/24   Standing for conversation or tasks unassisted   2     5   2.endurance with walking   4     6   3. Managing grocery store runs (including unloading and putting things away)   3     5   4. Picking up 10# or more and carry   2     5   Total Score 2.75 5.25     Total Score = Sum of activity scores/number of activities  Minimally Detectable Change: 3 points (for single activity); 2 points (for average score)  [facility] (nd). The Patient Specific Functional Scale . Retrieved from [email or web address]   COGNITION: Overall cognitive status: Within functional limits for tasks assessed     SENSATION: WFL   LUMBAR ROM: WNL for tasks assessed   LOWER EXTREMITY ROM:    WNL for tasks assessed   LOWER EXTREMITY MMT:  4 to 5/5 B tested in sitting    FUNCTIONAL TESTS:  5 times sit to stand: 17.01 sec 6 minute walk test: 923 ft RPE 5/10 vitals PR 94 bpm, O2 sats 90% Functional gait assessment:   FUNCTIONAL GAIT ASSESSMENT  Date: 04/29/24 Score  GAIT LEVEL SURFACE Instructions: Walk at your normal speed from here to the next mark (6 m) [20 ft]. (2) Mild impairment - Walks 6 m (20 ft) in less than 7 seconds but greater than 5.5 seconds, uses assistive device, slower speed, mild gait deviations, or deviates 15.24 -25.4 cm (6 -10 in) outside of the 30.48-cm (12-in) walkway width.  2.   CHANGE IN GAIT SPEED Instructions:  Begin walking at your normal pace (for 1.5 m [5 ft]). When I tell you "go," walk as fast as you can (for 1.5 m [5 ft]). When I tell you "slow," walk as slowly as you can (for 1.5 m [5 ft]. (2) Mild impairment - Is able to change speed but demonstrates mild gait deviations, deviates 15.24 -25.4 cm (6 -10 in) outside of the 30.48-cm (12-in) walkway width, or no gait deviations but unable to achieve a significant change in velocity, or uses an assistive device  3.    GAIT WITH HORIZONTAL HEAD TURNS Instructions: Walk from here to the next mark 6 m (20 ft) away. Begin walking at your normal pace. Keep walking straight; after 3 steps, turn your head to the right and keep walking straight while looking to the right. After 3 more steps, turn your head to the left and keep walking straight while looking left. Continue alternating looking right and left. (2) Mild impairment - Performs head turns smoothly with slight change in gait velocity (eg, minor disruption to smooth gait path), deviates 15.24 -25.4 cm (6 -10 in) outside 30.48-cm (12-in) walkway width, or uses an assistive device.  4.   GAIT WITH VERTICAL HEAD TURNS Instructions: Walk from here to the next mark (6 m [20 ft]). Begin walking at your normal pace. Keep walking straight; after 3 steps, tip your head up and keep walking straight while looking up. After 3 more steps, tip your head down, keep walking straight while looking down. Continue  alternating looking up and  down every 3 steps until you have completed 2 repetitions in each direction. (2) Mild impairment - Performs task with slight change in gait velocity (eg, minor disruption to smooth gait path), deviates 15.24 -25.4 cm (6 -10 in) outside 30.48-cm (12-in) walkway width or uses assistive device.  5.  GAIT AND PIVOT TURN Instructions: Begin with walking at your normal pace. When I tell you, "turn and stop," turn as quickly as you can to face the opposite direction and stop. (2) Mild impairment -  Pivot turns safely in 3 seconds and stops with no loss of balance, or pivot turns safely within 3 seconds and stops with mild imbalance, requires small steps to catch balance  6.   STEP OVER OBSTACLE Instructions: Begin walking at your normal speed. When you come to the shoe box, step over it, not around it, and keep walking. (2) Mild impairment - Is able to step over one shoe box (11.43 cm [4.5 in] total height) without changing gait speed; no evidence of imbalance.  7.   GAIT WITH NARROW BASE OF SUPPORT Instructions: Walk on the floor with arms folded across the chest, feet aligned heel to toe in tandem for a distance of 3.6 m [12 ft]. The number of steps taken in a straight line are counted for a maximum of 10 steps. (2) Mild impairment - Ambulates 7-9 steps  8.   GAIT WITH EYES CLOSED Instructions: Walk at your normal speed from here to the next mark (6 m [20 ft]) with your eyes closed. (1) Moderate impairment - Walks 6 m (20 ft), slow speed, abnormal gait pattern, evidence for imbalance, deviates 25.4 -38.1 cm (10 -15 in) outside 30.48-cm (12-in) walkway width. Requires more than 9 seconds to ambulate 6 m (20 ft).  9.   AMBULATING BACKWARDS Instructions: Walk backwards until I tell you to stop (2) Mild impairment - Walks 6 m (20 ft), uses assistive device, slower speed, mild gait deviations, deviates 15.24 -25.4 cm (6 -10 in) outside 30.48-cm (12-in) walkway width  10. STEPS Instructions: Walk up these stairs as you would at home (ie, using the rail if necessary). At the top turn around and walk down. (1) Moderate impairment-Two feet to a stair; must use rail.  Total 18/30   Interpretation of scores: Non-Specific Older Adults Cutoff Score: <=22/30 = risk of falls Parkinson's Disease Cutoff score <15/30= fall risk (Hoehn & Yahr 1-4)  Minimally Clinically Important Difference (MCID)  Stroke (acute, subacute, and chronic) = MDC: 4.2 points Vestibular (acute) = MDC: 6 points Community Dwelling  Older Adults =  MCID: 4 points Parkinson's Disease  =  MDC: 4.3 points  (Academy of Neu[employer]ic Physical Therapy (nd). Functional Gait Assessment. Retrieved from https://www.neuropt.org/docs/default-source/cpgs/core-outcome-measures/function-gait-assessment-pocket-guide-proof9-(2).pdf?sfvrsn=b31f35043_0.)  Interpretation of scores: Non-Specific Older Adults Cutoff Score: <=22/30 = risk of falls Parkinson's Disease Cutoff score <15/30= fall risk (Hoehn & Yahr 1-4)  Minimally Clinically Important Difference (MCID)  Stroke (acute, subacute, and chronic) = MDC: 4.2 points Vestibular (acute) = MDC: 6 points Community Dwelling Older Adults =  MCID: 4 points Parkinson's Disease  =  MDC: 4.3 points  (Academy of Neurologic Physical Therapy (nd). Functional Gait Assessment. Retrieved from https://www.neuropt.org/docs/default-source/cpgs/core-outcome-measures/function-gait-assessment-pocket-guide-proof9-(2).pdf?sfvrsn=b57f35043_0.)  GAIT: Safe gait patten with walking stick  TREATMENT DATE:  06/16/24 (patient made a wrong turn and was about 13 min late for appt) NuStep Level 5  x5 min- PT present to discuss status Seated lateral trunk stretch over peanut ball x 10 hold 5 sec Seated 3D ball roll outs x 10 each direction holding 5 sec each Seated reclined crunch 6# db 2 x 10 Seated ear to hip with 6# db x 10 each side Seated biceps curl 3# 2x 10 Seated steam engines 3# 2 x 10 bilateral  Seated tricep press with yellow tband 2 x 10 each UE  06/11/24 NuStep Level 5  x5 min- PT present to discuss status Seated reclined crunch 6# db 2 x 10 Seated ear to hip with 6# db x 10 each side Patient received phone call during treatment from provider and had to take this call due to needing to receive details of her upcoming ablation Seated modified Russian twist with 6#db 2 x 10 Seated  biceps curl 3# 2x 10 Seated steam engines 3# 2 x 10 bilateral  Seated tricep press with yellow tband 2 x 10 each UE Seated hamstring curl with red tband 2 x 10 each LE Seated clam with red loop x 20 Seated LAQ 3# AW 2 x 10 bilateral  Seated march with 3# x 20   06/04/24 NuStep Level 5 6 min- PT present to discuss status (patient started off at level 5, decreased to level 4 after about 1 min , then to level 3 at around 4 min, stopped completely at around 5 min I just can't do any more) Seated reclined crunch 5# kb 2 x 10 Seated ear to hip with 5# kb x 10 each side Seated modified Russian twist with 5#kb 2 x 10 Seated biceps curl 3# 2x 10 Seated steam engines 3# x 10 bilateral  Seated tricep press with yellow tband 2 x 10 each UE Seated hamstring curl with red tband 2 x 10 each LE Seated clam with red loop x 20 Seated LAQ 3# AW 2 x 10 bilateral  Standing on airex (hip abduction & extension)  2 x 10 bilateral    PATIENT EDUCATION:  Education details: PT eval findings, anticipated POC, and HEP review   Person educated: Patient Education method: verbally discussed Education comprehension: verbalized understanding  HOME EXERCISE PROGRAM: Access Code: [email or web address] URL: [email or web address] Date: 04/15/2024 Prepared by: Mliss (previous HEP from recent episode)  Exercises - Sit to Stand with Arms Crossed  - 1 x daily - 5 x weekly - 2 sets - 10 reps - Standing Hamstring Curl with Chair Support  - 1 x daily - 7 x weekly - 3 sets - 10-15 reps - Standing March with Counter Support  - 1 x daily - 7 x weekly - 2-3 sets - 1 min hold - Side Stepping with Counter Support  - 1 x daily - 5 x weekly - 2 sets - 1 min hold - Forward Step Up  - 1 x daily - 5 x weekly - 2-3 sets - 30 sec hold - Walking with Eyes Closed and Counter Support  - 1 x daily - 5 x weekly - 2 sets - 5 reps - Standing Near Stance in Corner with Eyes Closed  - 1 x daily - 5 x weekly - 3 sets - 30 sec  hold  ASSESSMENT:  CLINICAL IMPRESSION: Michelle Wilcox was a little late for her appt today due to making a wrong turn.  She was less fatigued today from a cardiac standpoint.  She admits  she stayed up late last night watching the world series so she is a little weak feeling.  However, she completed all tasks with ease today.  We were limited on time today.  Patient will benefit from skilled PT to address the below impairments and improve overall function.   Eval: Patient is a 78 y.o. female who was seen today for physical therapy evaluation and treatment for osteoporosis and fall prevention . She was recently discharged from neuro PT due to complications from Afib. She has recently returned to work and has deficits with endurance with standing and walking  affecting work and grocery shopping. She also reports deficits with carrying over 10#. She denies pain. She continues to report balance deficits, but states her balance is much improved and it depends on the day. She will benefit from skilled PT to address these deficits and those listed below. Due to her RTW she will only be available 1x/wk.  OBJECTIVE IMPAIRMENTS: cardiopulmonary status limiting activity, decreased activity tolerance, decreased balance, difficulty walking, and decreased strength.   ACTIVITY LIMITATIONS: carrying, lifting, standing, and locomotion level  PARTICIPATION LIMITATIONS: laundry, shopping, and occupation  PERSONAL FACTORS: Age, Fitness, Past/current experiences, Time since onset of injury/illness/exacerbation, and 3+ comorbidities: OP, Afib, vertigo are also affecting patient's functional outcome.   REHAB POTENTIAL: Good  CLINICAL DECISION MAKING: Evolving/moderate complexity  EVALUATION COMPLEXITY: Moderate   GOALS: Goals reviewed with patient? Yes  SHORT TERM GOALS: Target date: 05/27/24  Ind with initial HEP Baseline: Goal status: MET 06/11/24  2.  Decreased TUG by 2-3 seconds showing decreased risk for  falls. Baseline: 13.77 sec Goal status: In progress  3.  Decreased 5XSTS by 2-3 seconds showing functional improvement in strength Baseline:  Goal status: In progress   LONG TERM GOALS: Target date: 07/08/24  Ind with advanced HEP Baseline:  Goal status: INITIAL  2.  Improved average PSFS by 2 points showing functional improvement Baseline: 2.75 Goal status: MET 06/11/24  3.  Patient to report improved endurance with grocery shopping by 50%  Baseline:  Goal status: In Progress  4.  Improved to 1,100 ft or more with device as needed Baseline: 923 ft Goal status: In Progress  5.  FGA to improve at least 4 points demonstrating decreased risk for falls Baseline: TBD Goal status: INITIAL  6.  Able to carry >= 10# x 25 ft showing improved functional UE strength Baseline:  Goal status: INITIAL  7. Improved standing tolerance to > 15-20 min without AD Baseline:  Goal status: INITIAL  PLAN:  PT FREQUENCY: 1x/week  PT DURATION: 12 weeks  PLANNED INTERVENTIONS: 97164- PT Re-evaluation, 97110-Therapeutic exercises, 97530- Therapeutic activity, [id number]- Neuromuscular re-education, 97535- Self Care, [id number]- Manual therapy, [phone]- Gait training, Patient/Family education, Balance training, and Stair training.  PLAN FOR NEXT SESSION: Gait endurance, balance, functional LE and core strength for osteoporosis/balance   Travon Crochet B. Tamrah Victorino, PT 06/16/24 9:38 AM [facility] [address] [place], [place] [address] Phone # [phone] Fax [phone]

## 2024-06-18 ENCOUNTER — Other Ambulatory Visit: Payer: Self-pay | Admitting: Oncology

## 2024-06-18 DIAGNOSIS — D591 Autoimmune hemolytic anemia, unspecified: Secondary | ICD-10-CM

## 2024-06-22 ENCOUNTER — Telehealth (HOSPITAL_COMMUNITY): Payer: Self-pay

## 2024-06-22 NOTE — Telephone Encounter (Signed)
 Patient returned call to discuss upcoming procedure.   CT: not required.  Labs: completed.   Any recent signs of acute illness or been started on antibiotics? Patient reports last night she was watching television and her vision became blurred for about 20 minutes. EMS was called but vision symptoms had resolved by time of arrival. She denied any other symptoms other than elevated blood pressure of 160/110 that kept gradually coming down. She was assessed by EMS and determined no need to proceed to hospital. She had recalled her history of ocular migraines that occurs without pain but with blurred vision, triggered by stress per eye doctor. Reports she had forgotten to take anxiety medication and by end of night, blood pressure was 122/78. Today it was 134/84. Reports she feels completely fine today and had been really stressed about having procedure.  Any medications to hold?  No Any missed doses of blood thinner?  No Advised patient to continue taking Eliquis  (Apixaban ) twice daily without missing any doses.  Medication instructions:  On the morning of your procedure DO NOT take any medication., including Eliquis  (Apixaban ) or the procedure may be rescheduled. Nothing to eat or drink after midnight prior to your procedure.  Confirmed patient is scheduled for Atrial Fibrillation Ablation on Friday, November 7 with Dr. Dr. Kennyth. Instructed patient to arrive at the Main Entrance A at [facility]: [address] [place], [place] [address] and check in at Admitting at 9:30 AM.   Advised of plan to go home the same day and will only stay overnight if medically necessary. You MUST have a responsible adult to drive you home and MUST be with you the first 24 hours after you arrive home or your procedure could be cancelled.  Informed patient a nurse will call a day before the procedure to confirm arrival time and ensure instructions are followed.  Patient verbalized understanding to all  instructions provided and agreed to proceed with procedure.   Advised patient to contact RN Navigator at [phone], to inform of any new medications started after call or concerns prior to procedure.

## 2024-06-22 NOTE — Telephone Encounter (Signed)
 Error. See pre-procedure call on 06/08/24.

## 2024-06-23 ENCOUNTER — Ambulatory Visit: Payer: Self-pay | Attending: Family Medicine

## 2024-06-23 DIAGNOSIS — R252 Cramp and spasm: Secondary | ICD-10-CM | POA: Diagnosis not present

## 2024-06-23 DIAGNOSIS — M6281 Muscle weakness (generalized): Secondary | ICD-10-CM | POA: Diagnosis not present

## 2024-06-23 DIAGNOSIS — M5459 Other low back pain: Secondary | ICD-10-CM | POA: Diagnosis not present

## 2024-06-23 DIAGNOSIS — R262 Difficulty in walking, not elsewhere classified: Secondary | ICD-10-CM | POA: Insufficient documentation

## 2024-06-23 DIAGNOSIS — R293 Abnormal posture: Secondary | ICD-10-CM | POA: Diagnosis not present

## 2024-06-23 DIAGNOSIS — R2681 Unsteadiness on feet: Secondary | ICD-10-CM | POA: Diagnosis not present

## 2024-06-23 NOTE — Therapy (Signed)
 OUTPATIENT PHYSICAL THERAPY THORACOLUMBAR TREATMENT   Patient Name: Michelle Wilcox MRN: [id number] DOB:[birth year], 78 y.o., female Today's Date: 06/23/2024  END OF SESSION:  PT End of Session - 06/23/24 0855     Visit Number 9    Date for Recertification  07/08/24    Authorization Type BCBS Medicare    Progress Note Due on Visit 10    PT Start Time 0855    PT Stop Time 0933    PT Time Calculation (min) 38 min    Activity Tolerance Patient tolerated treatment well    Behavior During Therapy WFL for tasks assessed/performed              Past Medical History:  Diagnosis Date   Anxiety    hx of   Asthma    at times   CHF (congestive heart failure) (HCC)    Depression    hx of   Dyspnea    Giant cell arteritis (HCC)    Gouty arthropathy    Hemolytic anemia    Hyperlipidemia    Hypertension    Irregular heart beat    extra beat   Migraines    Polymyalgia    Polymyalgia rheumatica    Temporal arteritis (HCC)    Past Surgical History:  Procedure Laterality Date   BARTHOLIN GLAND CYST EXCISION     non ca   BREAST BIOPSY Right    CARDIOVERSION N/A 11/05/2023   Procedure: CARDIOVERSION;  Surgeon: Sheena Pugh, DO;  Location: [facility];  Service: Cardiovascular;  Laterality: N/A;   CARDIOVERSION N/A 12/19/2023   Procedure: CARDIOVERSION;  Surgeon: Santo Stanly LABOR, MD;  Location: [facility];  Service: Cardiovascular;  Laterality: N/A;   EYE MUSCLE SURGERY     as a child 46 yrs old   KNEE SURGERY  2004   rt knee   LAPAROSCOPIC SPLENECTOMY N/A 06/18/2018   Procedure: LAPAROSCOPIC SPLENECTOMY ERAS PATHWAY;  Surgeon: Mikell Katz, MD;  Location: [facility];  Service: General;  Laterality: N/A;   RIGHT/LEFT HEART CATH AND CORONARY ANGIOGRAPHY N/A 03/04/2024   Procedure: RIGHT/LEFT HEART CATH AND CORONARY ANGIOGRAPHY;  Surgeon: Anner Alm ORN, MD;  Location: [facility];  Service: Cardiovascular;  Laterality: N/A;   toncil      TONSILLECTOMY     as a child   TRANSESOPHAGEAL ECHOCARDIOGRAM (CATH LAB) N/A 11/05/2023   Procedure: TRANSESOPHAGEAL ECHOCARDIOGRAM;  Surgeon: Sheena Pugh, DO;  Location: [facility];  Service: Cardiovascular;  Laterality: N/A;   TRANSESOPHAGEAL ECHOCARDIOGRAM (CATH LAB) N/A 12/19/2023   Procedure: TRANSESOPHAGEAL ECHOCARDIOGRAM;  Surgeon: Santo Stanly LABOR, MD;  Location: [facility];  Service: Cardiovascular;  Laterality: N/A;   Patient Active Problem List   Diagnosis Date Noted   Acute on chronic congestive heart failure (HCC) 12/24/2023   Acute on chronic HFrEF (heart failure with reduced ejection fraction) (HCC) 12/21/2023   Atrial fibrillation with rapid ventricular response (HCC) 12/21/2023   Atrial fibrillation, rapid (HCC) 12/21/2023   Pericardial effusion 11/07/2023   Atrial fibrillation with RVR (HCC) 11/05/2023   Borderline abnormal TFTs 11/05/2023   Macrocytosis 11/05/2023   Atrial fibrillation (HCC) 11/04/2023   PMR (polymyalgia rheumatica) 11/04/2023   Primary localized osteoarthrosis of multiple sites 06/15/2022   Diastolic dysfunction without heart failure 03/05/2018   DOE (dyspnea on exertion) 03/05/2018   Preop cardiovascular exam 03/05/2018   Mild reactive airways disease 08/04/2017   Volume overload 08/03/2017   Hypokalemia 08/03/2017   Essential hypertension 08/03/2017  Hyperlipidemia 08/03/2017   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   Hypersensitivity reaction 11/20/2016   Drusen (degenerative) of retina, bilateral 05/22/2016   Nuclear cataract 05/22/2016   Retinal hemorrhage of left eye 05/22/2016   Temporal arteritis (HCC) 05/22/2016   Hemolytic anemia 03/19/2016   Breast mass, right 07/01/2012    PCP: Aisha Harvey, MD'  REFERRING PROVIDER: Aisha Harvey, MD   REFERRING DIAG: M81.0 (ICD-10-CM) - Age-related osteoporosis without current pathological fracture   Rationale for Evaluation and Treatment:  Rehabilitation  THERAPY DIAG:  Difficulty in walking, not elsewhere classified  Unsteadiness on feet  Abnormal posture  Other low back pain  Muscle weakness (generalized)  Cramp and spasm  ONSET DATE: 11/04/23  SUBJECTIVE:                                                                                                                                                                                           SUBJECTIVE STATEMENT: Patient reports doing ok today.      Eval: Here for OP ,She is still in Afib all the time (see history below). They want her to have an ablation now, before the end of the year. Seeing cardiologist tomorrow. She has been back to work since Aug 2 at [employer]. 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 Working 32 hours. Would like 1x/wk. She is challenged with back to back tours as she is talking. She still feels unsteady at times. It depends on the day. Standing is limited to 10 minutes due to fatigue.   PERTINENT HISTORY:   Vertigo, 12/31/2023:  Pt underwent attempt at cardioversion for a-fib on 12/19/23, which did not last >24 hrs; was hospitalized after ED visit for SOB, CHF.  Anxiety, asthma, CHF, depression, gouty arthropathy, anemia, HLD, HTN, migraines, polymyalgia rheumatica, temporal arteritis, R knee surgery   PAIN:  Are you having pain? No  PRECAUTIONS: None  RED FLAGS: None   WEIGHT BEARING RESTRICTIONS: No  FALLS:  Has patient fallen in last 6 months? No  LIVING ENVIRONMENT: Lives with: lives with their spouse who have stage 4 stomach CA (pt reports that she is having to physically assist him some) Lives in: Other townhouse  Stairs: 2 stories, sleeps on 1st floor in recliner  Has following equipment at home: Environmental Consultant - 2 wheeled, cane  OCCUPATION: works 32 hours per week  PLOF: Independent with basic ADLs, Independent with household mobility without device, Independent with community mobility with device, and  Vocation/Vocational requirements: walking and talking  PATIENT GOALS: Help to do with osteoporosis and balance still  NEXT MD VISIT: unsure  OBJECTIVE:  Note: Objective  measures were completed at Evaluation unless otherwise noted.  DIAGNOSTIC FINDINGS:  None recent  PATIENT SURVEYS:  PSFS: THE PATIENT SPECIFIC FUNCTIONAL SCALE  Place score of 0-10 (0 = unable to perform activity and 10 = able to perform activity at the same level as before injury or problem)  Activity Date: 04/15/24 Date: 06/11/24   Standing for conversation or tasks unassisted   2     5   2.endurance with walking   4     6   3. Managing grocery store runs (including unloading and putting things away)   3     5   4. Picking up 10# or more and carry   2     5   Total Score 2.75 5.25     Total Score = Sum of activity scores/number of activities  Minimally Detectable Change: 3 points (for single activity); 2 points (for average score)  [facility] (nd). The Patient Specific Functional Scale . Retrieved from [email or web address]   COGNITION: Overall cognitive status: Within functional limits for tasks assessed     SENSATION: WFL   LUMBAR ROM: WNL for tasks assessed   LOWER EXTREMITY ROM:    WNL for tasks assessed   LOWER EXTREMITY MMT:  4 to 5/5 B tested in sitting    FUNCTIONAL TESTS:  5 times sit to stand: 17.01 sec 06/23/24: 5 times sit to stand: 18.39 sec (a little worse but patient preparing for ablation)  TUG 06/23/24: TUG: 11.71 sec  6 minute walk test: 923 ft RPE 5/10 vitals PR 94 bpm, O2 sats 90% Functional gait assessment:   FUNCTIONAL GAIT ASSESSMENT  Date: 04/29/24 Score  GAIT LEVEL SURFACE Instructions: Walk at your normal speed from here to the next mark (6 m) [20 ft]. (2) Mild impairment - Walks 6 m (20 ft) in less than 7 seconds but greater than 5.5 seconds, uses assistive device, slower speed, mild gait deviations,  or deviates 15.24 -25.4 cm (6 -10 in) outside of the 30.48-cm (12-in) walkway width.  2.   CHANGE IN GAIT SPEED Instructions: Begin walking at your normal pace (for 1.5 m [5 ft]). When I tell you "go," walk as fast as you can (for 1.5 m [5 ft]). When I tell you "slow," walk as slowly as you can (for 1.5 m [5 ft]. (2) Mild impairment - Is able to change speed but demonstrates mild gait deviations, deviates 15.24 -25.4 cm (6 -10 in) outside of the 30.48-cm (12-in) walkway width, or no gait deviations but unable to achieve a significant change in velocity, or uses an assistive device  3.    GAIT WITH HORIZONTAL HEAD TURNS Instructions: Walk from here to the next mark 6 m (20 ft) away. Begin walking at your normal pace. Keep walking straight; after 3 steps, turn your head to the right and keep walking straight while looking to the right. After 3 more steps, turn your head to the left and keep walking straight while looking left. Continue alternating looking right and left. (2) Mild impairment - Performs head turns smoothly with slight change in gait velocity (eg, minor disruption to smooth gait path), deviates 15.24 -25.4 cm (6 -10 in) outside 30.48-cm (12-in) walkway width, or uses an assistive device.  4.   GAIT WITH VERTICAL HEAD TURNS Instructions: Walk from here to the next mark (6 m [20 ft]). Begin walking at your normal pace. Keep walking straight; after 3 steps, tip your head up and keep walking straight  while looking up. After 3 more steps, tip your head down, keep walking straight while looking down. Continue  alternating looking up and down every 3 steps until you have completed 2 repetitions in each direction. (2) Mild impairment - Performs task with slight change in gait velocity (eg, minor disruption to smooth gait path), deviates 15.24 -25.4 cm (6 -10 in) outside 30.48-cm (12-in) walkway width or uses assistive device.  5.  GAIT AND PIVOT TURN Instructions: Begin with walking at your normal pace.  When I tell you, "turn and stop," turn as quickly as you can to face the opposite direction and stop. (2) Mild impairment - Pivot turns safely in 3 seconds and stops with no loss of balance, or pivot turns safely within 3 seconds and stops with mild imbalance, requires small steps to catch balance  6.   STEP OVER OBSTACLE Instructions: Begin walking at your normal speed. When you come to the shoe box, step over it, not around it, and keep walking. (2) Mild impairment - Is able to step over one shoe box (11.43 cm [4.5 in] total height) without changing gait speed; no evidence of imbalance.  7.   GAIT WITH NARROW BASE OF SUPPORT Instructions: Walk on the floor with arms folded across the chest, feet aligned heel to toe in tandem for a distance of 3.6 m [12 ft]. The number of steps taken in a straight line are counted for a maximum of 10 steps. (2) Mild impairment - Ambulates 7-9 steps  8.   GAIT WITH EYES CLOSED Instructions: Walk at your normal speed from here to the next mark (6 m [20 ft]) with your eyes closed. (1) Moderate impairment - Walks 6 m (20 ft), slow speed, abnormal gait pattern, evidence for imbalance, deviates 25.4 -38.1 cm (10 -15 in) outside 30.48-cm (12-in) walkway width. Requires more than 9 seconds to ambulate 6 m (20 ft).  9.   AMBULATING BACKWARDS Instructions: Walk backwards until I tell you to stop (2) Mild impairment - Walks 6 m (20 ft), uses assistive device, slower speed, mild gait deviations, deviates 15.24 -25.4 cm (6 -10 in) outside 30.48-cm (12-in) walkway width  10. STEPS Instructions: Walk up these stairs as you would at home (ie, using the rail if necessary). At the top turn around and walk down. (1) Moderate impairment-Two feet to a stair; must use rail.  Total 18/30   Interpretation of scores: Non-Specific Older Adults Cutoff Score: <=22/30 = risk of falls Parkinson's Disease Cutoff score <15/30= fall risk (Hoehn & Yahr 1-4)  Minimally Clinically Important  Difference (MCID)  Stroke (acute, subacute, and chronic) = MDC: 4.2 points Vestibular (acute) = MDC: 6 points Community Dwelling Older Adults =  MCID: 4 points Parkinson's Disease  =  MDC: 4.3 points  (Academy of Neu[employer]ic Physical Therapy (nd). Functional Gait Assessment. Retrieved from https://www.neuropt.org/docs/default-source/cpgs/core-outcome-measures/function-gait-assessment-pocket-guide-proof9-(2).pdf?sfvrsn=b26f35043_0.)  Interpretation of scores: Non-Specific Older Adults Cutoff Score: <=22/30 = risk of falls Parkinson's Disease Cutoff score <15/30= fall risk (Hoehn & Yahr 1-4)  Minimally Clinically Important Difference (MCID)  Stroke (acute, subacute, and chronic) = MDC: 4.2 points Vestibular (acute) = MDC: 6 points Community Dwelling Older Adults =  MCID: 4 points Parkinson's Disease  =  MDC: 4.3 points  (Academy of Neurologic Physical Therapy (nd). Functional Gait Assessment. Retrieved from https://www.neuropt.org/docs/default-source/cpgs/core-outcome-measures/function-gait-assessment-pocket-guide-proof9-(2).pdf?sfvrsn=b100f35043_0.)  GAIT: Safe gait patten with walking stick  TREATMENT DATE:  06/23/24 (patient states surgeon called as she was walking out the door.  She was about 10 min late for appt by time of check-in) NuStep Level 5  x5 min- PT present to discuss status Seated lateral trunk stretch over peanut ball x 10 hold 5 sec both sides due to S curve scoliosis Seated 3D ball roll outs x 10 each direction holding 5 sec each Patient explained that she had to call 911 yesterday evening: her vision went blurry and her BP was up.  Emergency services evaluated her and suggested she go to hospital but she recalled that she had not taken her anti-anxiety medication and she has hx of ocular migraines that are not painful.  She felt this was what was  happening.  She had no s/s of CVA.  She declined transport to hospital and all symptoms resolved. Seated reclined crunch 6# db 2 x 10 Seated ear to hip with 6# db x 10 each side Seated biceps curl 3# 2x 10 Seated steam engines 3# 2 x 10 bilateral  5 TSTS and TUG completed Seated tricep press with yellow tband 2 x 10 each UE  06/16/24 (patient made a wrong turn and was about 13 min late for appt) NuStep Level 5  x5 min- PT present to discuss status Seated lateral trunk stretch over peanut ball x 10 hold 5 sec Seated 3D ball roll outs x 10 each direction holding 5 sec each Seated reclined crunch 6# db 2 x 10 Seated ear to hip with 6# db x 10 each side Seated biceps curl 3# 2x 10 Seated steam engines 3# 2 x 10 bilateral  Seated tricep press with yellow tband 2 x 10 each UE  06/11/24 NuStep Level 5  x5 min- PT present to discuss status Seated reclined crunch 6# db 2 x 10 Seated ear to hip with 6# db x 10 each side Patient received phone call during treatment from provider and had to take this call due to needing to receive details of her upcoming ablation Seated modified Russian twist with 6#db 2 x 10 Seated biceps curl 3# 2x 10 Seated steam engines 3# 2 x 10 bilateral  Seated tricep press with yellow tband 2 x 10 each UE Seated hamstring curl with red tband 2 x 10 each LE Seated clam with red loop x 20 Seated LAQ 3# AW 2 x 10 bilateral  Seated march with 3# x 20   PATIENT EDUCATION:  Education details: PT eval findings, anticipated POC, and HEP review   Person educated: Patient Education method: verbally discussed Education comprehension: verbalized understanding  HOME EXERCISE PROGRAM: Access Code: [email or web address] URL: [email or web address] Date: 04/15/2024 Prepared by: Mliss (previous HEP from recent episode)  Exercises - Sit to Stand with Arms Crossed  - 1 x daily - 5 x weekly - 2 sets - 10 reps - Standing Hamstring Curl with Chair Support  - 1 x daily - 7 x  weekly - 3 sets - 10-15 reps - Standing March with Counter Support  - 1 x daily - 7 x weekly - 2-3 sets - 1 min hold - Side Stepping with Counter Support  - 1 x daily - 5 x weekly - 2 sets - 1 min hold - Forward Step Up  - 1 x daily - 5 x weekly - 2-3 sets - 30 sec hold - Walking with Eyes Closed and Counter Support  - 1 x daily - 5 x weekly - 2 sets - 5 reps - Standing Near  Stance in Corner with Eyes Closed  - 1 x daily - 5 x weekly - 3 sets - 30 sec hold  ASSESSMENT:  CLINICAL IMPRESSION: Marylyn is progressing appropriately.  We are keeping her program simple until she has her ablation. She completed all tasks with ease but does get winded with functional testing.  She is compliant but is dealing with some co-morbidities that impede her ability to tolerate more aggressive exercise at this time.  She should cont to benefit from skilled PT to address the below impairments and improve overall function.   Eval: Patient is a 78 y.o. female who was seen today for physical therapy evaluation and treatment for osteoporosis and fall prevention . She was recently discharged from neuro PT due to complications from Afib. She has recently returned to work and has deficits with endurance with standing and walking  affecting work and grocery shopping. She also reports deficits with carrying over 10#. She denies pain. She continues to report balance deficits, but states her balance is much improved and it depends on the day. She will benefit from skilled PT to address these deficits and those listed below. Due to her RTW she will only be available 1x/wk.  OBJECTIVE IMPAIRMENTS: cardiopulmonary status limiting activity, decreased activity tolerance, decreased balance, difficulty walking, and decreased strength.   ACTIVITY LIMITATIONS: carrying, lifting, standing, and locomotion level  PARTICIPATION LIMITATIONS: laundry, shopping, and occupation  PERSONAL FACTORS: Age, Fitness, Past/current experiences, Time  since onset of injury/illness/exacerbation, and 3+ comorbidities: OP, Afib, vertigo are also affecting patient's functional outcome.   REHAB POTENTIAL: Good  CLINICAL DECISION MAKING: Evolving/moderate complexity  EVALUATION COMPLEXITY: Moderate   GOALS: Goals reviewed with patient? Yes  SHORT TERM GOALS: Target date: 05/27/24  Ind with initial HEP Baseline: Goal status: MET 06/11/24  2.  Decreased TUG by 2-3 seconds showing decreased risk for falls. Baseline: 13.77 sec Goal status: In progress  3.  Decreased 5XSTS by 2-3 seconds showing functional improvement in strength Baseline:  Goal status: In progress   LONG TERM GOALS: Target date: 07/08/24  Ind with advanced HEP Baseline:  Goal status: INITIAL  2.  Improved average PSFS by 2 points showing functional improvement Baseline: 2.75 Goal status: MET 06/11/24  3.  Patient to report improved endurance with grocery shopping by 50%  Baseline:  Goal status: In Progress  4.  Improved to 1,100 ft or more with device as needed Baseline: 923 ft Goal status: In Progress  5.  FGA to improve at least 4 points demonstrating decreased risk for falls Baseline: TBD Goal status: INITIAL  6.  Able to carry >= 10# x 25 ft showing improved functional UE strength Baseline:  Goal status: INITIAL  7. Improved standing tolerance to > 15-20 min without AD Baseline:  Goal status: INITIAL  PLAN:  PT FREQUENCY: 1x/week  PT DURATION: 12 weeks  PLANNED INTERVENTIONS: [id number]- PT Re-evaluation, 97110-Therapeutic exercises, 97530- Therapeutic activity, [id number]- Neuromuscular re-education, 97535- Self Care, [id number]- Manual therapy, [phone]- Gait training, Patient/Family education, Balance training, and Stair training.  PLAN FOR NEXT SESSION: 10th visit reassessment,  Gait endurance, balance, functional LE and core strength for osteoporosis/balance   Dhana Totton B. Rainier Feuerborn, PT 06/23/24 9:38 AM [facility] [address], [address] [place], [place] [address] Phone # [phone] Fax [phone]

## 2024-06-25 NOTE — Anesthesia Preprocedure Evaluation (Signed)
 Anesthesia Evaluation  Patient identified by MRN, date of birth, ID band Patient awake    Reviewed: Allergy & Precautions, H&P , NPO status , Patient's Chart, lab work & pertinent test results  Airway Mallampati: II  TM Distance: <3 FB Neck ROM: full    Dental  (+) Teeth Intact, Dental Advisory Given   Pulmonary neg pulmonary ROS, shortness of breath, asthma    breath sounds clear to auscultation       Cardiovascular hypertension, Pt. on medications +CHF and + DOE  + dysrhythmias Atrial Fibrillation  Rhythm:irregular Rate:Normal  ECHO: LV EF: 60% - 65%  Myocardial perfusion The left ventricular ejection fraction is hyperdynamic (>65%). Nuclear stress EF: 69%. There was no ST segment deviation noted during stress. The study is normal. This is a low risk study. Low risk stress nuclear study with normal perfusion and normal left ventricular regional and global systolic function.     Neuro/Psych  Headaches PSYCHIATRIC DISORDERS Anxiety Depression     Neuromuscular disease negative neurological ROS  negative psych ROS   GI/Hepatic negative GI ROS, Neg liver ROS,,,  Endo/Other  negative endocrine ROS    Renal/GU negative Renal ROS  negative genitourinary   Musculoskeletal negative musculoskeletal ROS (+) Arthritis ,    Abdominal   Peds negative pediatric ROS (+)  Hematology negative hematology ROS (+) Blood dyscrasia, anemia   Anesthesia Other Findings   Reproductive/Obstetrics negative OB ROS                              Anesthesia Physical Anesthesia Plan  ASA: 3  Anesthesia Plan: General   Post-op Pain Management: Minimal or no pain anticipated   Induction: Intravenous  PONV Risk Score and Plan: 2 and Propofol  infusion and Treatment may vary due to age or medical condition  Airway Management Planned: Oral ETT  Additional Equipment: None  Intra-op Plan:   Post-operative  Plan: Extubation in OR  Informed Consent: I have reviewed the patients History and Physical, chart, labs and discussed the procedure including the risks, benefits and alternatives for the proposed anesthesia with the patient or authorized representative who has indicated his/her understanding and acceptance.     Dental advisory given  Plan Discussed with: CRNA, Anesthesiologist and Surgeon  Anesthesia Plan Comments: (  )        Anesthesia Quick Evaluation

## 2024-06-25 NOTE — Pre-Procedure Instructions (Signed)
 Instructed patient on the following items: Arrival time 0730 Nothing to eat or drink after midnight No meds AM of procedure Responsible person to drive you home and stay with you for 24 hrs  Have you missed any doses of anti-coagulant Eliquis - takes twice a day, hasn't missed any doses in last 4 weeks.  Don't take dose morning of procedure.

## 2024-06-26 ENCOUNTER — Ambulatory Visit (HOSPITAL_COMMUNITY)
Admission: RE | Admit: 2024-06-26 | Discharge: 2024-06-26 | Disposition: A | Discharge status: Home / Self Care | Attending: Cardiology | Admitting: Cardiology

## 2024-06-26 ENCOUNTER — Encounter (HOSPITAL_COMMUNITY)
Admission: RE | Disposition: A | Discharge status: Home / Self Care | Payer: Self-pay | Source: Home / Self Care | Attending: Cardiology

## 2024-06-26 ENCOUNTER — Ambulatory Visit (HOSPITAL_COMMUNITY): Payer: Self-pay | Admitting: Anesthesiology

## 2024-06-26 ENCOUNTER — Other Ambulatory Visit: Payer: Self-pay

## 2024-06-26 DIAGNOSIS — E785 Hyperlipidemia, unspecified: Secondary | ICD-10-CM

## 2024-06-26 DIAGNOSIS — I119 Hypertensive heart disease without heart failure: Secondary | ICD-10-CM | POA: Diagnosis not present

## 2024-06-26 DIAGNOSIS — D6869 Other thrombophilia: Secondary | ICD-10-CM | POA: Insufficient documentation

## 2024-06-26 DIAGNOSIS — I4819 Other persistent atrial fibrillation: Secondary | ICD-10-CM | POA: Insufficient documentation

## 2024-06-26 DIAGNOSIS — I5023 Acute on chronic systolic (congestive) heart failure: Secondary | ICD-10-CM

## 2024-06-26 DIAGNOSIS — I110 Hypertensive heart disease with heart failure: Secondary | ICD-10-CM | POA: Diagnosis not present

## 2024-06-26 DIAGNOSIS — J45909 Unspecified asthma, uncomplicated: Secondary | ICD-10-CM | POA: Diagnosis not present

## 2024-06-26 DIAGNOSIS — Z7901 Long term (current) use of anticoagulants: Secondary | ICD-10-CM | POA: Diagnosis not present

## 2024-06-26 HISTORY — PX: ATRIAL FIBRILLATION ABLATION: EP1191

## 2024-06-26 MED ORDER — APIXABAN 5 MG PO TABS
5.0000 mg | ORAL_TABLET | Freq: Once | ORAL | Status: AC
  Administered 2024-06-26: 5 mg via ORAL
  Filled 2024-06-26: qty 1

## 2024-06-26 MED ORDER — ATROPINE SULFATE 1 MG/10ML IJ SOSY
PREFILLED_SYRINGE | INTRAMUSCULAR | Status: DC | PRN
  Administered 2024-06-26: 1 mg via INTRAVENOUS

## 2024-06-26 MED ORDER — PHENYLEPHRINE HCL-NACL 20-0.9 MG/250ML-% IV SOLN
INTRAVENOUS | Status: DC | PRN
  Administered 2024-06-26: 30 ug/min via INTRAVENOUS

## 2024-06-26 MED ORDER — HEPARIN SODIUM (PORCINE) 1000 UNIT/ML IJ SOLN
INTRAMUSCULAR | Status: AC
  Filled 2024-06-26: qty 20

## 2024-06-26 MED ORDER — CIPROFLOXACIN IN D5W 400 MG/200ML IV SOLN
400.0000 mg | Freq: Once | INTRAVENOUS | Status: AC
  Administered 2024-06-26: 400 mg via INTRAVENOUS

## 2024-06-26 MED ORDER — HEPARIN SODIUM (PORCINE) 1000 UNIT/ML IJ SOLN
INTRAMUSCULAR | Status: DC | PRN
  Administered 2024-06-26: 5000 [IU] via INTRAVENOUS
  Administered 2024-06-26: 14000 [IU] via INTRAVENOUS

## 2024-06-26 MED ORDER — FENTANYL CITRATE (PF) 100 MCG/2ML IJ SOLN
INTRAMUSCULAR | Status: AC
  Filled 2024-06-26: qty 2

## 2024-06-26 MED ORDER — FENTANYL CITRATE (PF) 100 MCG/2ML IJ SOLN
25.0000 ug | INTRAMUSCULAR | Status: DC | PRN
  Administered 2024-06-26: 25 ug via INTRAVENOUS

## 2024-06-26 MED ORDER — SODIUM CHLORIDE 0.9% FLUSH
3.0000 mL | INTRAVENOUS | Status: DC | PRN
Start: 2024-06-26 — End: 2024-06-26

## 2024-06-26 MED ORDER — DIPHENHYDRAMINE HCL 50 MG/ML IJ SOLN
INTRAMUSCULAR | Status: DC | PRN
  Administered 2024-06-26: 12.5 mg via INTRAVENOUS

## 2024-06-26 MED ORDER — SODIUM CHLORIDE 0.9 % IV SOLN: 250.0000 mL | INTRAVENOUS | Status: DC | PRN

## 2024-06-26 MED ORDER — SODIUM CHLORIDE 0.9 % IV SOLN: INTRAVENOUS | Status: DC

## 2024-06-26 MED ORDER — ONDANSETRON HCL 4 MG/2ML IJ SOLN
INTRAMUSCULAR | Status: DC | PRN
  Administered 2024-06-26: 4 mg via INTRAVENOUS

## 2024-06-26 MED ORDER — PROPOFOL 10 MG/ML IV BOLUS
INTRAVENOUS | Status: DC | PRN
  Administered 2024-06-26: 100 mg via INTRAVENOUS

## 2024-06-26 MED ORDER — LIDOCAINE 2% (20 MG/ML) 5 ML SYRINGE
INTRAMUSCULAR | Status: DC | PRN
  Administered 2024-06-26: 60 mg via INTRAVENOUS

## 2024-06-26 MED ORDER — CIPROFLOXACIN IN D5W 400 MG/200ML IV SOLN
INTRAVENOUS | Status: AC
Start: 2024-06-26 — End: 2024-06-26
  Filled 2024-06-26: qty 200

## 2024-06-26 MED ORDER — IOHEXOL 350 MG/ML SOLN
INTRAVENOUS | Status: DC | PRN
  Administered 2024-06-26: 8 mL

## 2024-06-26 MED ORDER — SODIUM CHLORIDE 0.9% FLUSH: 3.0000 mL | Freq: Two times a day (BID) | INTRAVENOUS | Status: DC

## 2024-06-26 MED ORDER — CEFAZOLIN SODIUM-DEXTROSE 2-4 GM/100ML-% IV SOLN
INTRAVENOUS | Status: AC
  Filled 2024-06-26: qty 100

## 2024-06-26 MED ORDER — ONDANSETRON HCL 4 MG/2ML IJ SOLN
4.0000 mg | Freq: Four times a day (QID) | INTRAMUSCULAR | Status: DC | PRN
Start: 2024-06-26 — End: 2024-06-26

## 2024-06-26 MED ORDER — DIPHENHYDRAMINE HCL 50 MG/ML IJ SOLN
INTRAMUSCULAR | Status: AC
  Filled 2024-06-26: qty 1

## 2024-06-26 MED ORDER — HEPARIN (PORCINE) IN NACL 1000-0.9 UT/500ML-% IV SOLN
INTRAVENOUS | Status: DC | PRN
  Administered 2024-06-26 (×3): 500 mL

## 2024-06-26 MED ORDER — ROCURONIUM BROMIDE 10 MG/ML (PF) SYRINGE
PREFILLED_SYRINGE | INTRAVENOUS | Status: DC | PRN
  Administered 2024-06-26: 10 mg via INTRAVENOUS
  Administered 2024-06-26: 60 mg via INTRAVENOUS

## 2024-06-26 MED ORDER — SUGAMMADEX SODIUM 200 MG/2ML IV SOLN
INTRAVENOUS | Status: DC | PRN
  Administered 2024-06-26: 200 mg via INTRAVENOUS

## 2024-06-26 MED ORDER — FENTANYL CITRATE (PF) 100 MCG/2ML IJ SOLN
INTRAMUSCULAR | Status: DC | PRN
  Administered 2024-06-26 (×2): 50 ug via INTRAVENOUS

## 2024-06-26 MED ORDER — KETOROLAC TROMETHAMINE 15 MG/ML IJ SOLN
15.0000 mg | Freq: Once | INTRAMUSCULAR | Status: DC
  Filled 2024-06-26: qty 1

## 2024-06-26 MED ORDER — PROTAMINE SULFATE 10 MG/ML IV SOLN
INTRAVENOUS | Status: DC | PRN
  Administered 2024-06-26: 35 mg via INTRAVENOUS

## 2024-06-26 MED ORDER — DEXAMETHASONE SOD PHOSPHATE PF 10 MG/ML IJ SOLN
INTRAMUSCULAR | Status: DC | PRN
  Administered 2024-06-26: 10 mg via INTRAVENOUS

## 2024-06-26 MED ORDER — DEXMEDETOMIDINE HCL IN NACL 80 MCG/20ML IV SOLN
INTRAVENOUS | Status: DC | PRN
  Administered 2024-06-26 (×2): 8 ug via INTRAVENOUS
  Administered 2024-06-26: 4 ug via INTRAVENOUS

## 2024-06-26 MED ORDER — ATROPINE SULFATE 1 MG/10ML IJ SOSY
PREFILLED_SYRINGE | INTRAMUSCULAR | Status: AC
  Filled 2024-06-26: qty 10

## 2024-06-26 MED ORDER — MIDAZOLAM HCL (PF) 2 MG/2ML IJ SOLN
1.0000 mg | Freq: Once | INTRAMUSCULAR | Status: AC | PRN
  Administered 2024-06-26: 1 mg via INTRAVENOUS
  Filled 2024-06-26: qty 2

## 2024-06-26 NOTE — Anesthesia Procedure Notes (Signed)
 Procedure Name: Intubation Date/Time: 06/26/2024 9:48 AM  Performed by: Lamar Lucie DASEN, CRNAPre-anesthesia Checklist: Patient identified, Emergency Drugs available, Suction available and Patient being monitored Patient Re-evaluated:Patient Re-evaluated prior to induction Oxygen Delivery Method: Circle system utilized Preoxygenation: Pre-oxygenation with 100% oxygen Induction Type: IV induction Ventilation: Mask ventilation without difficulty and Oral airway inserted - appropriate to patient size Laryngoscope Size: Glidescope and 3 Grade View: Grade I Tube type: Oral Tube size: 7.0 mm Number of attempts: 1 Airway Equipment and Method: Stylet and Oral airway Placement Confirmation: ETT inserted through vocal cords under direct vision, positive ETCO2 and breath sounds checked- equal and bilateral Secured at: 21 cm Tube secured with: Tape Dental Injury: Teeth and Oropharynx as per pre-operative assessment  Difficulty Due To: Difficult Airway- due to anterior larynx

## 2024-06-26 NOTE — Discharge Instructions (Signed)

## 2024-06-26 NOTE — Transfer of Care (Signed)
 Immediate Anesthesia Transfer of Care Note  Patient: Michelle Wilcox  Procedure(s) Performed: ATRIAL FIBRILLATION ABLATION  Patient Location: Cath Lab  Anesthesia Type:General  Level of Consciousness: drowsy and patient cooperative  Airway & Oxygen Therapy: Patient Spontanous Breathing and Patient connected to nasal cannula oxygen  Post-op Assessment: Report given to RN, Post -op Vital signs reviewed and stable, and Patient moving all extremities X 4  Post vital signs: Reviewed and stable  Last Vitals:  Vitals Value Taken Time  BP 111/88   Temp    Pulse 61 06/26/24 11:37  Resp 13 06/26/24 11:37  SpO2 92 % 06/26/24 11:37  Vitals shown include unfiled device data.  Last Pain:  Vitals:   06/26/24 0843  TempSrc: Oral  PainSc:          Complications: There were no known notable events for this encounter.

## 2024-06-26 NOTE — H&P (Signed)
 Electrophysiology Note:   Date:  06/26/24  ID:  Michelle Wilcox, DOB Dec 26, 1945, MRN [id number]   Primary Cardiologist: Alm Clay, MD Electrophysiologist: Fonda Kitty, MD       History of Present Illness:   Michelle Wilcox is a 78 y.o. female with h/o AF, HTN, HLD, PMR, Giant Cell Arteritis, autoimmune hemolytic anemia s/p splenectomy on prednisone , asthma, anxiety who is being seen today for follow up management of her AF.   Patient was admitted in 10/2023 for [facility] after a cortisone shot in her knee.  During that admission, she was planned for cardioversion but TEE showed left atrial appendage thrombus.  She was started on amiodarone , eliquis , metoprolol  and digoxin . The patient presented on 12/20/23 for TEE with DCCV.  LVEF was 65% and no further thrombus noted, LA/RA severely dilated with mod-sev functional MR.  She cardioverted with shock x1 at 200j. Patient felt well postprocedure and went to [facility] when she suddenly began to have palpitations and progressive shortness of breath.  She went home and did not recover prompting ER visit.  In the emergency room she was noted to be in A-fib with RVR.  She was hypoxemic requiring BiPAP.  CXR showed cardiomegaly, vascular congestion and small bilateral pleural effusions.  She was started on amiodarone , treated with IV Lasix  and digoxin . She was discharged on amiodarone . Wore a 2 week Zio which showed 100% AF burden.   Discussed the use of AI scribe software for clinical note transcription with the patient, who gave verbal consent to proceed.   History of Present Illness Michelle Wilcox is a 78 year old female with atrial fibrillation who presents for consideration of an ablation procedure. She reports Dr. Clay encouraged her to consider ablation following a heart catheterization.   She is not currently on amiodarone , as it was ineffective in maintaining normal rhythm. She remains on digoxin , which was reduced  to half a pill due to high levels. She is taking Eliquis . She recalls a past experience where she was taken to the hospital by ambulance due to breathing difficulties and low oxygen levels following a cardioversion. 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 Taking breaks during these tours helps manage her symptoms. She generally feels well except during periods of exertion, which she suspects could be due to her AF.     Interval: Patient presents today for planned ablation. Reports feeling relatively well. No new or acute complaints.   Review of systems complete and found to be negative unless listed in HPI.    EP Information / Studies Reviewed:     EKG is not ordered today. EKG from 02/25/24 reviewed which showed AF.        Echo 12/22/23:  1. Left ventricular ejection fraction, by estimation, is 60 to 65%. The  left ventricle has normal function. The left ventricle has no regional  wall motion abnormalities. Left ventricular diastolic parameters are  indeterminate.   2. Right ventricular systolic function is normal. The right ventricular  size is normal. Tricuspid regurgitation signal is inadequate for assessing  PA pressure.   3. Left atrial size was moderately dilated.   4. The mitral valve is normal in structure. Trivial mitral valve  regurgitation. No evidence of mitral stenosis.   5. The aortic valve is tricuspid. Aortic valve regurgitation is mild to  moderate. No aortic stenosis is present.   6. The inferior vena cava is dilated  in size with >50% respiratory  variability, suggesting right atrial pressure of 8 mmHg.    Zio Monitor 01/17/24:  Patch Wear Time:  13 days and 23 hours (2025-05-09T15:42:31-0400 to 2025-05-23T15:42:23-0400)   1 run of Ventricular Tachycardia occurred lasting 5 beats with a max rate of 138 bpm (avg 115 bpm). Atrial Fibrillation occurred continuously (100% burden), ranging from 42-161  bpm (avg of 74 bpm). Isolated VEs were occasional (1.1%, 16470), VE Couplets  were rare (<1.0%, 10), and no VE Triplets were present. Ventricular Bigeminy and Trigeminy were present.    Physical Exam:    Today's Vitals   06/26/24 0822 06/26/24 0843  BP:  (!) 154/90  Pulse:  83  Resp:  20  Temp:  98.1 F (36.7 C)  TempSrc:  Oral  SpO2:  95%  Weight:  78.5 kg  Height:  5' 3 (1.6 m)  PainSc: 0-No pain    Body mass index is 30.65 kg/m.   GEN: Well nourished, well developed in no acute distress NECK: No JVD CARDIAC: Normal rate, irregular rhythm RESPIRATORY:  Clear to auscultation without rales, wheezing or rhonchi  ABDOMEN: Soft, non-distended EXTREMITIES:  No edema; No deformity    ASSESSMENT AND PLAN:       #Persistent atrial fibrillation: Had cardioversion with ERAF. Noted severe LA dilatation on TEE with mod-sev MR. Recent Zio with 100% AF burden despite amiodarone . She is symptomatic despite rate control. #Secondary hypercoagulable state due to AF: Denies bleeding issues. -Discussed treatment options today for AF including antiarrhythmic drug therapy and ablation. Discussed risks, recovery and likelihood of success with each treatment strategy. Risk, benefits, and alternatives to EP study and ablation for afib were discussed. These risks include but are not limited to stroke, bleeding, vascular damage, tamponade, perforation, damage to the esophagus, lungs, phrenic nerve and other structures, pulmonary vein stenosis, worsening renal function, coronary vasospasm and death.  Discussed potential need for repeat ablation procedures and antiarrhythmic drugs after an initial ablation. The patient understands these risk and wishes to proceed today. - Continue Toprol  to 75 mg once daily. - Continue digoxin . - Continue Eliquis  5 mg twice daily for now.       Signed, Fonda Kitty, MD

## 2024-06-27 NOTE — Anesthesia Postprocedure Evaluation (Signed)
 Anesthesia Post Note  Patient: Michelle Wilcox  Procedure(s) Performed: ATRIAL FIBRILLATION ABLATION     Patient location during evaluation: PACU Anesthesia Type: General Level of consciousness: awake and alert Pain management: pain level controlled Vital Signs Assessment: post-procedure vital signs reviewed and stable Respiratory status: spontaneous breathing, nonlabored ventilation, respiratory function stable and patient connected to nasal cannula oxygen Cardiovascular status: blood pressure returned to baseline and stable Postop Assessment: no apparent nausea or vomiting Anesthetic complications: no   There were no known notable events for this encounter.  Last Vitals:  Vitals:   06/26/24 1430 06/26/24 1500  BP: 135/83 125/71  Pulse: 71 67  Resp: 15 14  Temp:    SpO2: 94% 93%    Last Pain:  Vitals:   06/26/24 1235  TempSrc:   PainSc: 4    Pain Goal:                   [name]

## 2024-06-28 ENCOUNTER — Encounter (HOSPITAL_COMMUNITY): Payer: Self-pay | Admitting: Cardiology

## 2024-06-29 ENCOUNTER — Telehealth (HOSPITAL_COMMUNITY): Payer: Self-pay

## 2024-06-29 ENCOUNTER — Encounter: Payer: Self-pay | Admitting: Emergency Medicine

## 2024-06-29 LAB — POCT ACTIVATED CLOTTING TIME
Activated Clotting Time: 268 s
Activated Clotting Time: 331 s

## 2024-06-29 NOTE — Telephone Encounter (Signed)
 Spoke with patient to complete post procedure follow up call.  Patient reports no complications with groin sites.   Instructions reviewed with patient:  It is normal to have bruising, tenderness, mild swelling, and a pea or marble sized lump/knot at the groin site which can take up to three months to resolve.  Get help right away if you notice sudden swelling at the puncture site.  Check your puncture site every day for signs of infection: fever, redness, swelling, pus drainage, warmth, foul odor or excessive pain. If this occurs, please call [phone], to speak with the RN Navigator. Get help right away if your puncture site is bleeding and the bleeding does not stop after applying firm pressure to the area.  You may continue to have skipped beats/ atrial fibrillation during the first several months after your procedure.  It is very important not to miss any doses of your blood thinner Eliquis.    You will follow up with the Afib clinic 4 weeks after your procedure and follow up with the APP 3 months after your procedure.  Activity restrictions reviewed.  Patient verbalized understanding to all instructions provided.

## 2024-07-02 ENCOUNTER — Ambulatory Visit: Payer: Self-pay

## 2024-07-02 DIAGNOSIS — R2681 Unsteadiness on feet: Secondary | ICD-10-CM

## 2024-07-02 DIAGNOSIS — R252 Cramp and spasm: Secondary | ICD-10-CM | POA: Diagnosis not present

## 2024-07-02 DIAGNOSIS — M6281 Muscle weakness (generalized): Secondary | ICD-10-CM

## 2024-07-02 DIAGNOSIS — R262 Difficulty in walking, not elsewhere classified: Secondary | ICD-10-CM

## 2024-07-02 DIAGNOSIS — R293 Abnormal posture: Secondary | ICD-10-CM | POA: Diagnosis not present

## 2024-07-02 DIAGNOSIS — M5459 Other low back pain: Secondary | ICD-10-CM | POA: Diagnosis not present

## 2024-07-02 NOTE — Therapy (Signed)
 OUTPATIENT PHYSICAL THERAPY THORACOLUMBAR TREATMENT Progress Note Reporting Period 04/15/24 to 07/02/24  See note below for Objective Data and Assessment of Progress/Goals.       Patient Name: Michelle Wilcox MRN: [id number] DOB:06-28-1946, 78 y.o., female Today's Date: 07/02/2024  END OF SESSION:  PT End of Session - 07/02/24 1620     Visit Number 10    Date for Recertification  07/08/24    Authorization Type BCBS Medicare    Progress Note Due on Visit 20    PT Start Time 1621    PT Stop Time 1700    PT Time Calculation (min) 39 min    Activity Tolerance Patient tolerated treatment well    Behavior During Therapy WFL for tasks assessed/performed              Past Medical History:  Diagnosis Date   Anxiety    hx of   Asthma    at times   CHF (congestive heart failure) (HCC)    Depression    hx of   Dyspnea    Giant cell arteritis (HCC)    Gouty arthropathy    Hemolytic anemia    Hyperlipidemia    Hypertension    Irregular heart beat    extra beat   Migraines    Polymyalgia    Polymyalgia rheumatica    Temporal arteritis (HCC)    Past Surgical History:  Procedure Laterality Date   ATRIAL FIBRILLATION ABLATION N/A 06/26/2024   Procedure: ATRIAL FIBRILLATION ABLATION;  Surgeon: Kennyth Chew, MD;  Location: [facility];  Service: Cardiovascular;  Laterality: N/A;   BARTHOLIN GLAND CYST EXCISION     non ca   BREAST BIOPSY Right    CARDIOVERSION N/A 11/05/2023   Procedure: CARDIOVERSION;  Surgeon: Sheena Pugh, DO;  Location: [facility];  Service: Cardiovascular;  Laterality: N/A;   CARDIOVERSION N/A 12/19/2023   Procedure: CARDIOVERSION;  Surgeon: Santo Stanly LABOR, MD;  Location: [facility];  Service: Cardiovascular;  Laterality: N/A;   EYE MUSCLE SURGERY     as a child 35 yrs old   KNEE SURGERY  2004   rt knee   LAPAROSCOPIC SPLENECTOMY N/A 06/18/2018   Procedure: LAPAROSCOPIC SPLENECTOMY ERAS PATHWAY;  Surgeon:  Mikell Katz, MD;  Location: [facility];  Service: General;  Laterality: N/A;   RIGHT/LEFT HEART CATH AND CORONARY ANGIOGRAPHY N/A 03/04/2024   Procedure: RIGHT/LEFT HEART CATH AND CORONARY ANGIOGRAPHY;  Surgeon: Anner Alm ORN, MD;  Location: [facility];  Service: Cardiovascular;  Laterality: N/A;   toncil     TONSILLECTOMY     as a child   TRANSESOPHAGEAL ECHOCARDIOGRAM (CATH LAB) N/A 11/05/2023   Procedure: TRANSESOPHAGEAL ECHOCARDIOGRAM;  Surgeon: Sheena Pugh, DO;  Location: [facility];  Service: Cardiovascular;  Laterality: N/A;   TRANSESOPHAGEAL ECHOCARDIOGRAM (CATH LAB) N/A 12/19/2023   Procedure: TRANSESOPHAGEAL ECHOCARDIOGRAM;  Surgeon: Santo Stanly LABOR, MD;  Location: [facility];  Service: Cardiovascular;  Laterality: N/A;   Patient Active Problem List   Diagnosis Date Noted   Acute on chronic congestive heart failure (HCC) 12/24/2023   Acute on chronic HFrEF (heart failure with reduced ejection fraction) (HCC) 12/21/2023   Atrial fibrillation with rapid ventricular response (HCC) 12/21/2023   Atrial fibrillation, rapid (HCC) 12/21/2023   Pericardial effusion 11/07/2023   Atrial fibrillation with RVR (HCC) 11/05/2023   Borderline abnormal TFTs 11/05/2023   Macrocytosis 11/05/2023   Atrial fibrillation (HCC) 11/04/2023   PMR (polymyalgia rheumatica)  11/04/2023   Primary localized osteoarthrosis of multiple sites 06/15/2022   Diastolic dysfunction without heart failure 03/05/2018   DOE (dyspnea on exertion) 03/05/2018   Preop cardiovascular exam 03/05/2018   Mild reactive airways disease 08/04/2017   Volume overload 08/03/2017   Hypokalemia 08/03/2017   Essential hypertension 08/03/2017   Hyperlipidemia 08/03/2017   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   Hypersensitivity reaction 11/20/2016   Drusen (degenerative) of retina, bilateral 05/22/2016   Nuclear cataract 05/22/2016   Retinal hemorrhage of left eye 05/22/2016   Temporal  arteritis (HCC) 05/22/2016   Hemolytic anemia 03/19/2016   Breast mass, right 07/01/2012    PCP: Aisha Harvey, MD'  REFERRING PROVIDER: Aisha Harvey, MD   REFERRING DIAG: M81.0 (ICD-10-CM) - Age-related osteoporosis without current pathological fracture   Rationale for Evaluation and Treatment: Rehabilitation  THERAPY DIAG:  Difficulty in walking, not elsewhere classified  Unsteadiness on feet  Abnormal posture  Muscle weakness (generalized)  Cramp and spasm  Other low back pain  ONSET DATE: 11/04/23  SUBJECTIVE:                                                                                                                                                                                           SUBJECTIVE STATEMENT: Patient reports really tired since ablation but the procedure did work and I am no longer in afib.      Eval: Here for OP ,She is still in Afib all the time (see history below). They want her to have an ablation now, before the end of the year. Seeing cardiologist tomorrow. She has been back to work since Aug 2 at [employer]. 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 Working 32 hours. Would like 1x/wk. She is challenged with back to back tours as she is talking. She still feels unsteady at times. It depends on the day. Standing is limited to 10 minutes due to fatigue.   PERTINENT HISTORY:   Vertigo, 12/31/2023:  Pt underwent attempt at cardioversion for a-fib on 12/19/23, which did not last >24 hrs; was hospitalized after ED visit for SOB, CHF.  Anxiety, asthma, CHF, depression, gouty arthropathy, anemia, HLD, HTN, migraines, polymyalgia rheumatica, temporal arteritis, R knee surgery   PAIN:  Are you having pain? No  PRECAUTIONS: None  RED FLAGS: None   WEIGHT BEARING RESTRICTIONS: No  FALLS:  Has patient fallen in last 6 months? No  LIVING ENVIRONMENT: Lives with: lives with their spouse who have stage 4 stomach CA (pt  reports that she is having to physically assist him some) Lives in: Other townhouse  Stairs: 2 stories, sleeps on  1st floor in recliner  Has following equipment at home: Walker - 2 wheeled, cane  OCCUPATION: works 32 hours per week  PLOF: Independent with basic ADLs, Independent with household mobility without device, Independent with community mobility with device, and Vocation/Vocational requirements: walking and talking  PATIENT GOALS: Help to do with osteoporosis and balance still  NEXT MD VISIT: unsure  OBJECTIVE:  Note: Objective measures were completed at Evaluation unless otherwise noted.  DIAGNOSTIC FINDINGS:  None recent  PATIENT SURVEYS:  PSFS: THE PATIENT SPECIFIC FUNCTIONAL SCALE  Place score of 0-10 (0 = unable to perform activity and 10 = able to perform activity at the same level as before injury or problem)  Activity Date: 04/15/24 Date: 06/11/24 Date: 07/02/24 Patient just had ablation and is quite limited from a cardiac standpoint  Standing for conversation or tasks unassisted   2     5   7   2.endurance with walking   4     6   6   3. Managing grocery store runs (including unloading and putting things away)   3     5    2   4. Picking up 10# or more and carry   2     5 0  Total Score 2.75 5.25     Total Score = Sum of activity scores/number of activities  Minimally Detectable Change: 3 points (for single activity); 2 points (for average score)  [facility] (nd). The Patient Specific Functional Scale . Retrieved from [email or web address]   COGNITION: Overall cognitive status: Within functional limits for tasks assessed     SENSATION: WFL   LUMBAR ROM: WNL for tasks assessed   LOWER EXTREMITY ROM:    WNL for tasks assessed   LOWER EXTREMITY MMT:  4 to 5/5 B tested in sitting    FUNCTIONAL TESTS:  5 times sit to stand: 17.01 sec  06/23/24: 5 times sit to stand: 18.39 sec (a  little worse but patient preparing for ablation) TUG 06/23/24: TUG: 11.71 sec 6 minute walk test: 923 ft RPE 5/10 vitals PR 94 bpm, O2 sats 90%   TUG 07/02/24: TUG: 14.19 sec 07/02/24 5 times sit to stand: 30.33 sec   Functional gait assessment:  FUNCTIONAL GAIT ASSESSMENT  Date: 04/29/24 Score  GAIT LEVEL SURFACE Instructions: Walk at your normal speed from here to the next mark (6 m) [20 ft]. (2) Mild impairment - Walks 6 m (20 ft) in less than 7 seconds but greater than 5.5 seconds, uses assistive device, slower speed, mild gait deviations, or deviates 15.24 -25.4 cm (6 -10 in) outside of the 30.48-cm (12-in) walkway width.  2.   CHANGE IN GAIT SPEED Instructions: Begin walking at your normal pace (for 1.5 m [5 ft]). When I tell you "go," walk as fast as you can (for 1.5 m [5 ft]). When I tell you "slow," walk as slowly as you can (for 1.5 m [5 ft]. (2) Mild impairment - Is able to change speed but demonstrates mild gait deviations, deviates 15.24 -25.4 cm (6 -10 in) outside of the 30.48-cm (12-in) walkway width, or no gait deviations but unable to achieve a significant change in velocity, or uses an assistive device  3.    GAIT WITH HORIZONTAL HEAD TURNS Instructions: Walk from here to the next mark 6 m (20 ft) away. Begin walking at your normal pace. Keep walking straight; after 3 steps, turn your head to the right and keep walking straight while looking to the right.  After 3 more steps, turn your head to the left and keep walking straight while looking left. Continue alternating looking right and left. (2) Mild impairment - Performs head turns smoothly with slight change in gait velocity (eg, minor disruption to smooth gait path), deviates 15.24 -25.4 cm (6 -10 in) outside 30.48-cm (12-in) walkway width, or uses an assistive device.  4.   GAIT WITH VERTICAL HEAD TURNS Instructions: Walk from here to the next mark (6 m [20 ft]). Begin walking at your normal pace. Keep walking straight; after  3 steps, tip your head up and keep walking straight while looking up. After 3 more steps, tip your head down, keep walking straight while looking down. Continue  alternating looking up and down every 3 steps until you have completed 2 repetitions in each direction. (2) Mild impairment - Performs task with slight change in gait velocity (eg, minor disruption to smooth gait path), deviates 15.24 -25.4 cm (6 -10 in) outside 30.48-cm (12-in) walkway width or uses assistive device.  5.  GAIT AND PIVOT TURN Instructions: Begin with walking at your normal pace. When I tell you, "turn and stop," turn as quickly as you can to face the opposite direction and stop. (2) Mild impairment - Pivot turns safely in 3 seconds and stops with no loss of balance, or pivot turns safely within 3 seconds and stops with mild imbalance, requires small steps to catch balance  6.   STEP OVER OBSTACLE Instructions: Begin walking at your normal speed. When you come to the shoe box, step over it, not around it, and keep walking. (2) Mild impairment - Is able to step over one shoe box (11.43 cm [4.5 in] total height) without changing gait speed; no evidence of imbalance.  7.   GAIT WITH NARROW BASE OF SUPPORT Instructions: Walk on the floor with arms folded across the chest, feet aligned heel to toe in tandem for a distance of 3.6 m [12 ft]. The number of steps taken in a straight line are counted for a maximum of 10 steps. (2) Mild impairment - Ambulates 7-9 steps  8.   GAIT WITH EYES CLOSED Instructions: Walk at your normal speed from here to the next mark (6 m [20 ft]) with your eyes closed. (1) Moderate impairment - Walks 6 m (20 ft), slow speed, abnormal gait pattern, evidence for imbalance, deviates 25.4 -38.1 cm (10 -15 in) outside 30.48-cm (12-in) walkway width. Requires more than 9 seconds to ambulate 6 m (20 ft).  9.   AMBULATING BACKWARDS Instructions: Walk backwards until I tell you to stop (2) Mild impairment - Walks 6 m (20  ft), uses assistive device, slower speed, mild gait deviations, deviates 15.24 -25.4 cm (6 -10 in) outside 30.48-cm (12-in) walkway width  10. STEPS Instructions: Walk up these stairs as you would at home (ie, using the rail if necessary). At the top turn around and walk down. (1) Moderate impairment-Two feet to a stair; must use rail.  Total 18/30   Interpretation of scores: Non-Specific Older Adults Cutoff Score: <=22/30 = risk of falls Parkinson's Disease Cutoff score <15/30= fall risk (Hoehn & Yahr 1-4)  Minimally Clinically Important Difference (MCID)  Stroke (acute, subacute, and chronic) = MDC: 4.2 points Vestibular (acute) = MDC: 6 points Community Dwelling Older Adults =  MCID: 4 points Parkinson's Disease  =  MDC: 4.3 points  ([employer]y [employer] (nd). Functional Gait Assessment. Retrieved from https://www.neuropt.org/docs/default-source/cpgs/core-outcome-measures/function-gait-assessment-pocket-guide-proof9-(2).pdf?sfvrsn=b19f35043_0.)  Interpretation of scores: Non-Specific Older Adults Cutoff Score: <=  22/30 = risk of falls Parkinson's Disease Cutoff score <15/30= fall risk (Hoehn & Yahr 1-4)  Minimally Clinically Important Difference (MCID)  Stroke (acute, subacute, and chronic) = MDC: 4.2 points Vestibular (acute) = MDC: 6 points Community Dwelling Older Adults =  MCID: 4 points Parkinson's Disease  =  MDC: 4.3 points  (Academy of Neurologic Physical Therapy (nd). Functional Gait Assessment. Retrieved from https://www.neuropt.org/docs/default-source/cpgs/core-outcome-measures/function-gait-assessment-pocket-guide-proof9-(2).pdf?sfvrsn=b1f35043_0.)  GAIT: Safe gait patten with walking stick  TREATMENT DATE:                                                                                                                               07/02/24  10th visit re-assessment completed Seated biceps curl 2# 2x 10 Seated steam engines 3# 2 x 10 bilateral  5  TSTS and TUG completed Seated tricep press with red tband 2 x 10 each UE Patient was fatigued: we held on further treatment today  06/23/24 (patient states surgeon called as she was walking out the door.  She was about 10 min late for appt by time of check-in) NuStep Level 5  x5 min- PT present to discuss status Seated lateral trunk stretch over peanut ball x 10 hold 5 sec both sides due to S curve scoliosis Seated 3D ball roll outs x 10 each direction holding 5 sec each Patient explained that she had to call 911 yesterday evening: her vision went blurry and her BP was up.  Emergency services evaluated her and suggested she go to hospital but she recalled that she had not taken her anti-anxiety medication and she has hx of ocular migraines that are not painful.  She felt this was what was happening.  She had no s/s of CVA.  She declined transport to hospital and all symptoms resolved. Seated reclined crunch 6# db 2 x 10 Seated ear to hip with 6# db x 10 each side Seated biceps curl 3# 2x 10 Seated steam engines 3# 2 x 10 bilateral  5 TSTS and TUG completed Seated tricep press with yellow tband 2 x 10 each UE  06/16/24 (patient made a wrong turn and was about 13 min late for appt) NuStep Level 5  x5 min- PT present to discuss status Seated lateral trunk stretch over peanut ball x 10 hold 5 sec Seated 3D ball roll outs x 10 each direction holding 5 sec each Seated reclined crunch 6# db 2 x 10 Seated ear to hip with 6# db x 10 each side Seated biceps curl 3# 2x 10 Seated steam engines 3# 2 x 10 bilateral  Seated tricep press with yellow tband 2 x 10 each UE  06/11/24 NuStep Level 5  x5 min- PT present to discuss status Seated reclined crunch 6# db 2 x 10 Seated ear to hip with 6# db x 10 each side Patient received phone call during treatment from provider and had to take this call due to needing to receive details of her upcoming ablation  Seated modified Russian twist with 6#db 2 x  10 Seated biceps curl 3# 2x 10 Seated steam engines 3# 2 x 10 bilateral  Seated tricep press with yellow tband 2 x 10 each UE Seated hamstring curl with red tband 2 x 10 each LE Seated clam with red loop x 20 Seated LAQ 3# AW 2 x 10 bilateral  Seated march with 3# x 20   PATIENT EDUCATION:  Education details: PT eval findings, anticipated POC, and HEP review   Person educated: Patient Education method: verbally discussed Education comprehension: verbalized understanding  HOME EXERCISE PROGRAM: Access Code: [email or web address] URL: [email or web address] Date: 04/15/2024 Prepared by: Mliss (previous HEP from recent episode)  Exercises - Sit to Stand with Arms Crossed  - 1 x daily - 5 x weekly - 2 sets - 10 reps - Standing Hamstring Curl with Chair Support  - 1 x daily - 7 x weekly - 3 sets - 10-15 reps - Standing March with Counter Support  - 1 x daily - 7 x weekly - 2-3 sets - 1 min hold - Side Stepping with Counter Support  - 1 x daily - 5 x weekly - 2 sets - 1 min hold - Forward Step Up  - 1 x daily - 5 x weekly - 2-3 sets - 30 sec hold - Walking with Eyes Closed and Counter Support  - 1 x daily - 5 x weekly - 2 sets - 5 reps - Standing Near Stance in Corner with Eyes Closed  - 1 x daily - 5 x weekly - 3 sets - 30 sec hold  ASSESSMENT:  CLINICAL IMPRESSION: Lashun was quite fatigued today.  We completed 10th visit reassessment today.  Her objective findings were at a deficit but this is understandable for her at this time post ablation.    She would benefit from skilled PT to address the below impairments and improve overall function.   Eval: Patient is a 77 y.o. female who was seen today for physical therapy evaluation and treatment for osteoporosis and fall prevention . She was recently discharged from neuro PT due to complications from Afib. She has recently returned to work and has deficits with endurance with standing and walking  affecting work and grocery shopping. She  also reports deficits with carrying over 10#. She denies pain. She continues to report balance deficits, but states her balance is much improved and it depends on the day. She will benefit from skilled PT to address these deficits and those listed below. Due to her RTW she will only be available 1x/wk.  OBJECTIVE IMPAIRMENTS: cardiopulmonary status limiting activity, decreased activity tolerance, decreased balance, difficulty walking, and decreased strength.   ACTIVITY LIMITATIONS: carrying, lifting, standing, and locomotion level  PARTICIPATION LIMITATIONS: laundry, shopping, and occupation  PERSONAL FACTORS: Age, Fitness, Past/current experiences, Time since onset of injury/illness/exacerbation, and 3+ comorbidities: OP, Afib, vertigo are also affecting patient's functional outcome.   REHAB POTENTIAL: Good  CLINICAL DECISION MAKING: Evolving/moderate complexity  EVALUATION COMPLEXITY: Moderate   GOALS: Goals reviewed with patient? Yes  SHORT TERM GOALS: Target date: 05/27/24  Ind with initial HEP Baseline: Goal status: MET 06/11/24  2.  Decreased TUG by 2-3 seconds showing decreased risk for falls. Baseline: 13.77 sec Goal status: In progress  3.  Decreased 5XSTS by 2-3 seconds showing functional improvement in strength Baseline:  Goal status: In progress   LONG TERM GOALS: Target date: 07/08/24  Ind with advanced HEP Baseline:  Goal status: INITIAL  2.  Improved  average PSFS by 2 points showing functional improvement Baseline: 2.75 Goal status: MET 06/11/24  3.  Patient to report improved endurance with grocery shopping by 50%  Baseline:  Goal status: In Progress  4.  Improved to 1,100 ft or more with device as needed Baseline: 923 ft Goal status: In Progress  5.  FGA to improve at least 4 points demonstrating decreased risk for falls Baseline: TBD Goal status: INITIAL  6.  Able to carry >= 10# x 25 ft showing improved functional UE strength Baseline:   Goal status: INITIAL  7. Improved standing tolerance to > 15-20 min without AD Baseline:  Goal status: INITIAL  PLAN:  PT FREQUENCY: 1x/week  PT DURATION: 12 weeks  PLANNED INTERVENTIONS: 97164- PT Re-evaluation, 97110-Therapeutic exercises, 97530- Therapeutic activity, [id number]- Neuromuscular re-education, 97535- Self Care, [id number]- Manual therapy, [phone]- Gait training, Patient/Family education, Balance training, and Stair training.  PLAN FOR NEXT SESSION:  Gait endurance, balance, functional LE and core strength for osteoporosis/balance   Sallye Lunz B. Alann Avey, PT 07/02/24 8:30 PM [facility] [address], [address] [place], [place] [address] Phone # [phone] Fax [phone]

## 2024-07-07 ENCOUNTER — Ambulatory Visit: Attending: Cardiology | Admitting: Cardiology

## 2024-07-07 ENCOUNTER — Encounter: Payer: Self-pay | Admitting: Cardiology

## 2024-07-07 VITALS — BP 144/78 | HR 68 | Ht 63.0 in | Wt 172.8 lb

## 2024-07-07 DIAGNOSIS — I4891 Unspecified atrial fibrillation: Secondary | ICD-10-CM

## 2024-07-07 DIAGNOSIS — I428 Other cardiomyopathies: Secondary | ICD-10-CM

## 2024-07-07 DIAGNOSIS — I480 Paroxysmal atrial fibrillation: Secondary | ICD-10-CM | POA: Diagnosis not present

## 2024-07-07 DIAGNOSIS — I10 Essential (primary) hypertension: Secondary | ICD-10-CM

## 2024-07-07 DIAGNOSIS — E782 Mixed hyperlipidemia: Secondary | ICD-10-CM

## 2024-07-07 DIAGNOSIS — D6869 Other thrombophilia: Secondary | ICD-10-CM

## 2024-07-07 NOTE — Patient Instructions (Addendum)
 Medication Instructions:  No changes   *If you need a refill on your cardiac medications before your next appointment, please call your pharmacy*   Lab Work: Not needed    Testing/Procedures:  Not needed  Follow-Up: At [facility], you and your health needs are our priority.  As part of our continuing mission to provide you with exceptional heart care, we have created designated Provider Care Teams.  These Care Teams include your primary Cardiologist (physician) and Advanced Practice Providers (APPs -  Physician Assistants and Nurse Practitioners) who all work together to provide you with the care you need, when you need it.     Your next appointment:   4 month(s)-- March 2026  The format for your next appointment:   In Person  Provider:   Alm Clay, MD or [name], NP

## 2024-07-07 NOTE — Progress Notes (Signed)
 Cardiology Office Note:  .   Date:  07/07/2024  ID:  Michelle Wilcox, DOB 02-08-1946, MRN [id number] PCP: Aisha Harvey, MD  [facility] Providers Cardiologist:  Alm Clay, MD Electrophysiologist:  Fonda Kitty, MD     Chief Complaint  Patient presents with   Follow-up    28-month follow-up   Hospitalization Follow-up    First visit since her A-fib ablation   Atrial Fibrillation    Patient Profile: .     Michelle Wilcox is a  78 y.o. female  with a PMH reviewed below who presents here for routine follow-up/post PVI follow-up.    PMH   HTN with mild diastolic dysfunction,  HLD ( PMR and Giant Cell Arteritis with presumed Temporal Arteritis) Newly diagnosed Atrial Fibrillation (11/04/2023) => failed cardioversion, failed AAD therapy with amiodarone  => S/p PVI & Posterior Wall Isolation / Ablation (06/26/2024)     I last saw Michelle Wilcox in August 2024 after was laid follow-up.  She was doing well from a cardiac standpoint.  No real orthopnea or PND just had some issues with chronic back pain.  Poor balance, using a cane.  Rare heart luttering/palpitations.  She was on 50 mg twice daily Lopressor  and low-dose furosemide .  Consider potentially adding ARB.  ER visit/Admission 11/04/2023 with A-fib RVR in the 150s.  (Followed cortisone injection in the knee).  Noted dyspnea, dizziness and palpitations for more than a few days. => Plan for TEE/DCCV aborted due to TEE showing EF 40 to 45% with LAA thrombus. => Due to low blood pressure, after short course of amiodarone  for rate control, discharged on Toprol  50 mg twice daily plus digoxin  0.25 mg daily, and Eliquis  5 mg twice daily along with continued oral amiodarone  load 400 mg twice daily for 5 days, 20 mg twice daily for 2 weeks then 200 mg maintenance dose. She was seen by [name], NP on April 1 for hospital follow-up and was doing well.  Heart rate was controlled.  Did have some low blood  pressures at home.  She was actually doing PT at the facility.  Was working on hydration.  No chest pain or dizziness.  Mild exertional dyspnea.  Noted significant PTSD from a fall in the previous year. => No bleeding issues. => Plan was redo TEE with DCCV.  PRN Lasix  40 mg.  Borderline low BP. Second follow-up on 12/12/2023 => noted having issues with anxiety and BP was little higher than it had been. => Scheduled with TEE DCCV TEE/DCCV 12/19/2023 => successful DCCV ER visit 12/21/2023 with palpitations and dyspnea-noted to be in A-fib => chemically cardioverted with IV amiodarone  bolus; troponin 544 and BNP 193.  Felt to be demand ischemia.  Normal EF on echo => plan for outpatient stress PET and EP consultation; 2-week Zio patch monitor ordered to assess A-fib burden Seen for post DCCV follow-up on 01/10/2024 by [name], NP => noted fatigue on rate control medications.  No chest pain, PND orthopnea or edema.  She did note exertional dyspnea with fatigue having to stop to take breaks.  Just generally does not feel well.  Noted 10 pound weight loss since hospitalization with fluctuating low blood pressures.  Regularly skipping spironolactone .  She was still wearing the monitor with plans to follow-up with Dr. Kitty.  Consider the possibility of cardiac cath given positive troponin.  We decided to stop spironolactone  2-week follow-up with [name], NP 01/29/2024-noted that she is doing better.  No chest pain.,  PND orthopnea or edema.  Still noting fatigue and exertional dyspnea but feeling that this improved.  Still doing physical therapy.  Due to potential reaction to Lasix  and decided to back down to 20 mg Lasix  and was taking Benadryl  noting improvement in her symptoms. => Decided to delay discussion of catheterization until seen by Dr. Kennyth => seen again on 02/25/2024 First EP visit with Dr. Kennyth on 02/13/2024: He noted that after treating the potential allergic reaction and reducing dose of Lasix , her  allergic symptoms resolved and her energy level improved as well as well as her blood pressure stabilized.  They discussed potential ablation ablation.  Indicated with the 100% A-fib burden despite being on amiodarone , if ablation not pursued, she would be permanent A-fib . Follow-up with [name], NP 02/25/2024: Doing well.  No chest pain, orthopnea or edema.  Noted that her exertional dyspnea was more at her baseline.  No worsening symptoms.  No more weight loss.,  But no weight gain.  She did not notice a significant change since stopping amiodarone , and that her rate was pretty well-controlled.  Was tolerating increased dose of metoprolol , still noted ongoing fatigue.  Agreed to proceed with cardiac catheterization (see below)-recommendation was 3 days 40 mg Lasix  and then reduce to 20 mg. Post cath follow-up with Reche Mose on 03/19/2024: Doing well.  No palpitations.  No syncope or near-syncope.  BP well-controlled.  Eager to return to work.  Was also agreed to discuss plans for ablation with Dr. Kennyth.  Plan was for 3 more days of 40 mg Lasix  before return back to 20 mg. Seen by Dr. Kennyth on August 25 => Toprol  dose converted to 75 mg bid.  Plan to schedule atrial fibrillation/PVI PVI was on 06/26/2024 (Dr. Kennyth): Successful PVI and ablation/isolation of posterior wall.  ICE revealed normal LV function with moderate baseline pericardial effusion.  Subjective  Discussed the use of AI scribe software for clinical note transcription with the patient, who gave verbal consent to proceed.  History of Present Illness Michelle Wilcox is a 78 year old female with atrial fibrillation who presents for follow-up after an ablation procedure.  She has experienced atrial fibrillation since March, initially accompanied by significant fatigue. She underwent an ablation procedure on November 7th and has felt better since, with no sensations of being in atrial fibrillation and a steady heart rate. She  continues to feel that her heart is in a normal rhythm.  Her current medications include Eliquis  5 mg twice a day, metoprolol  XL 50 mg one and a half tablets twice a day, and digoxin  0.0625 mg once a day. She has been off amiodarone  for quite some time. She reports some improvement in fatigue post-procedure, describing it as more tiredness than fatigue, and notes that it is gradually getting better.  She has a history of two hospitalizations in March and April, each lasting a week, due to complications related to atrial fibrillation. The first hospitalization involved a failed cardioversion due to a clot, and the second followed a successful cardioversion that only lasted a day before symptoms returned. She underwent a heart catheterization which showed no coronary artery disease, and her pressures were good.  No chest pain, tightness, or significant shortness of breath currently. She does experience some swelling in her ankles, which she manages with Lasix  20 mg, as 40 mg was too much. She has not consumed alcohol since March.  She experienced excruciating pain in her groin post-ablation, which was attributed to  possible nerve irritation. The pain transitioned from stabbing to burning and resolved by the third day. She has not noticed any irregular heartbeats or significant symptoms since the procedure.  ROS:  Review of Systems - Negative except symptoms noted above    Objective   Current Meds  Medication Sig   acetaminophen  (TYLENOL ) 650 MG CR tablet Take 1,300 mg by mouth 2 (two) times daily.   apixaban  (ELIQUIS ) 5 MG TABS tablet TAKE 1 TABLET(5 MG) BY MOUTH TWICE DAILY   Bacitracin-Polymyxin B (NEOSPORIN EX) Apply 1 Application topically daily as needed (area on toe).   betamethasone 0.5% cream-vitamin a&d ointment 1:1 mixture Apply 1 Application topically daily as needed for irritation.   cholecalciferol  (VITAMIN D3) 25 MCG (1000 UNIT) tablet Take 1,000 Units by mouth every other day.    clindamycin (CLEOCIN) 150 MG capsule Take 600 mg by mouth See admin instructions. Take prior to Dental Procedures   diclofenac  Sodium (VOLTAREN ) 1 % GEL Apply 4 g topically as needed (Hands, knees, feet).   digoxin  (LANOXIN ) 0.125 MG tablet Take 0.5 tablets (0.0625 mg total) by mouth daily.   Emollient (LUBRIDERM) LOTN Apply 1 Application topically daily as needed.   folic acid  (FOLVITE ) 1 MG tablet TAKE 1 TABLET(1 MG) BY MOUTH DAILY   furosemide  (LASIX ) 20 MG tablet Take 1 tablet (20 mg total) by mouth daily. Take an additional dose once a day as needed for lower extremity Edema.   gabapentin  (NEURONTIN ) 100 MG capsule Take 200 mg by mouth 2 (two) times daily.   lactase (LACTAID) 3000 units tablet Take 9,000-15,000 Units by mouth as needed (consuming dairy products).   loratadine  (CLARITIN ) 10 MG tablet Take 10 mg by mouth daily.   metoprolol  succinate (TOPROL -XL) 50 MG 24 hr tablet Take 1.5 tablets (75 mg total) by mouth in the morning and at bedtime. Take with or immediately following a meal.   Multiple Vitamin (MULTIVITAMIN) tablet Take 1 tablet by mouth at bedtime. One-A-Day Women's Vitamin   potassium chloride  SA (KLOR-CON  M) 20 MEQ tablet Take 10 mEq by mouth daily.   predniSONE  (DELTASONE ) 5 MG tablet Take 1 tablet (5 mg total) by mouth daily with breakfast.   Probiotic Product (ALIGN) 4 MG CAPS Take 4 mg by mouth every morning.   rosuvastatin  (CRESTOR ) 5 MG tablet Take 5 mg by mouth every other day.   Social History - Alcohol: Has not consumed alcohol since March; inquires about resuming occasional consumption of wine, beer, or margaritas.   Studies Reviewed: SABRA   EKG Interpretation Date/Time:  Tuesday July 07 2024 08:58:59 EST Ventricular Rate:  68 PR Interval:  128 QRS Duration:  82 QT Interval:  394 QTC Calculation: 418 R Axis:   -28  Text Interpretation: Normal sinus rhythm Low voltage QRS When compared with ECG of 26-Jun-2024 11:37, QRS axis Shifted left Nonspecific T  wave abnormality, improved in Anterior leads QT has shortened Confirmed by Anner Lenis ([id number]) on 07/07/2024 9:11:49 AM    Lab Results  Component Value Date   NA 142 06/11/2024   K 4.8 06/11/2024   CREATININE 0.77 06/11/2024   EGFR 78 06/11/2024   GLUCOSE 156 (H) 06/11/2024   Lab Results  Component Value Date   CHOL 149 03/11/2024   HDL 55 03/11/2024   LDLCALC 68 03/11/2024   TRIG 154 (H) 03/11/2024   CHOLHDL 2.7 03/11/2024      Latest Ref Rng & Units 06/11/2024    2:51 PM 05/18/2024   11:44 AM 03/04/2024  9:14 AM  CBC  WBC 3.4 - 10.8 x10E3/uL 10.7  12.3    Hemoglobin 11.1 - 15.9 g/dL 86.1  85.9  86.3   Hematocrit 34.0 - 46.6 % 42.9  43.0  40.0   Platelets 150 - 450 x10E3/uL 322  313      RECENT STUDIES  TEE : LVEF 40 to 45%.  Mild reduced function with global hypokinesis.  Mildly reduced RV size and function with mild RV dilation.  Severe LA dilation with LAA thrombus noted.  Small to moderate pericardial effusion but no tamponade.  Mild to moderate MR.  Mild to moderate AI.  GR 2 descending aortic plaque (11/05/2023)  TEE: EF 60 to 65%.  Normal RV size and function.  Mildly enlarged RV.  Severely dilated LA.  No thrombus.  Severely dilated RA.  Functional MR with blunted pulmonary vein signal and central jet of MR.  Moderate to severe MR noted.  Also functional TR with 2 jets.  Moderate AI.  (12/19/2023):  Echocardiogram: EF 60 to 65%.  No RWMA.  Indeterminate diastolic parameters.  Moderate LA dilation.  Normal RV size and function.  Unable to assess PAP but mildly elevated RAP at 8 mmHg.  Trace MR.  Mild to moderate AI.  ((12/22/2023)  Zio patch monitor : 100% A-fib with a rate range of 42 to 161 bpm-average 74 bpm.  1.1% isolated PVCs with some couplets, ventricular bigeminy or trigeminy.  One 5 beat run of NSVT-max 138 bpm.  No sustained VT (01/17/2024)  R&L Heart Catheterization: Angiographically normal coronaries with right dominant system and notable ramus intermedius.   Moderate PHTN: PAP-mean 42/29-35 mmHg; PCWP mean 24-26 mmHg Elevated LV EDP consistent with volume overload. LVP-EDP 155/9-24 mmHg; ALP-MAP 153/78-105 mmHg. Waveform not consistent with valvular regurgitation Ao sat 95%, PA sat 62%. Fick Cardiac Output-Index: Moderately reduced at 3.67 and 1.99.  Borderline mild AS (03/04/2024)    PREVIOUS STUDIES Myoview  03/11/2018: LOW.  Normal EF.  No ischemia or infarction. Echocardiogram 08/04/2017: Normal LV size and motion.  EF 60 to 65%.  GR 1 DD.  No RWMA.  Mild MR.  Mild LA dilation.  Mild AI.  Borderline elevated RAP, suggesting mild pulm hypertension.. CT Chest 02/09/2023 (for follow-up): No acute intrathoracic trauma.  No acute airspace disease.  Stable aortic atherosclerosis and coronary atherosclerosis.  Cholelithiasis but no cholecystitis.  Risk Assessment/Calculations:    CHA2DS2-VASc Score = 6   This indicates a 9.7% annual risk of stroke. The patient's score is based upon: CHF History: 1 HTN History: 1 Diabetes History: 0 Stroke History: 0 Vascular Disease History: 1 (Aortic atherosclerosis.) Age Score: 2 Gender Score: 1          Physical Exam:   VS:  BP (!) 144/78 (BP Location: Left Arm, Patient Position: Sitting)   Pulse 68   Ht 5' 3 (1.6 m)   Wt 172 lb 12.8 oz (78.4 kg)   SpO2 96%   BMI 30.61 kg/m    Wt Readings from Last 3 Encounters:  07/07/24 172 lb 12.8 oz (78.4 kg)  06/26/24 173 lb (78.5 kg)  05/18/24 178 lb 4.8 oz (80.9 kg)     GEN: Healthy-appearing.Well nourished, well groomed; in no acute distress;  NECK: No JVD; No carotid bruits CARDIAC: Normal S1, S2; RRR, no murmurs, rubs, gallops RESPIRATORY:  Clear to auscultation without rales, wheezing or rhonchi ; nonlabored, good air movement. ABDOMEN: Soft, non-tender, non-distended EXTREMITIES:  No edema; No deformity      ASSESSMENT AND  PLAN: .    Problem List Items Addressed This Visit       Cardiology Problems   Essential hypertension (Chronic)    Mildly elevated blood pressure today, will continue to monitor.  If pressures remain elevated in upcoming visits, we can add ARB      Hypercoagulable state due to paroxysmal atrial fibrillation (HCC) (Chronic)   CHA2DS2-VASc is at least 6. On Eliquis  5 mg twice daily.  She did have ablation, but is still in the early stages and will likely remain on DOAC for the near future.  Will defer timing of and whether or not she can stop Eliquis  2 EP.  However, at this point would be okay to hold Eliquis  2 to 3 days preop for surgeries or procedures.      Hyperlipidemia (Chronic)   Labs from July look outstanding on low-dose rosuvastatin  5 mg every other day.  Continue current regimen..      Nonischemic cardiomyopathy (HCC) (Chronic)   Likely tachycardia mediated cardiomyopathy with mild heart failure symptoms now of edema and some exertional dyspnea but relatively stable. Clearly exacerbated by A-fib and mostly A-fib RVR. Hospitalized twice in March and April with complications of A-fib. -Was not able to tolerate higher doses of Lasix , continue 20 mg Lasix  with additional 20 mg as needed for worsening edema. - Continue Toprol -XL 75 mg twice daily  - Monitor blood pressure, if remains elevated, would consider adding ARB      RESOLVED: Paroxysmal atrial fibrillation (HCC) - Primary (Chronic)   Relevant Orders   EKG 12-Lead (Completed)             Follow-Up: Return in about 4 months (around 11/04/2024) for [employer].  I spent 77 minutes in the care of Michelle Wilcox today including reviewing labs (2 minutes), reviewing studies (15 minutes reviewing echocardiograms, TEE's, cardiac catheterization, Zio patch, DCCV and PVI), face to face time discussing treatment options (23 minutes), reviewing records from multiple ED visits, ER visits and visits with Dr. Kennyth (22 minutes), 15 minutes dictating, and documenting in the encounter.      Signed, Alm MICAEL Clay, MD, MS Alm Clay, M.D., M.S. Interventional Cardiologist  [facility] Pager # [phone]

## 2024-07-07 NOTE — Assessment & Plan Note (Signed)
 Likely tachycardia mediated cardiomyopathy with mild heart failure symptoms now of edema and some exertional dyspnea but relatively stable. Clearly exacerbated by A-fib and mostly A-fib RVR. Hospitalized twice in March and April with complications of A-fib. -Was not able to tolerate higher doses of Lasix , continue 20 mg Lasix  with additional 20 mg as needed for worsening edema. - Continue Toprol -XL 75 mg twice daily  - Monitor blood pressure, if remains elevated, would consider adding ARB

## 2024-07-07 NOTE — Assessment & Plan Note (Signed)
 Labs from July look outstanding on low-dose rosuvastatin  5 mg every other day.  Continue current regimen.SABRA

## 2024-07-07 NOTE — Assessment & Plan Note (Signed)
 CHA2DS2-VASc is at least 6. On Eliquis  5 mg twice daily.  She did have ablation, but is still in the early stages and will likely remain on DOAC for the near future.  Will defer timing of and whether or not she can stop Eliquis  2 EP.  However, at this point would be okay to hold Eliquis  2 to 3 days preop for surgeries or procedures.

## 2024-07-07 NOTE — Assessment & Plan Note (Signed)
 Atrial fibrillation, status post ablation Status post ablation on November 7th, 2025. In normal sinus rhythm, heart rate 68 bpm. No AFib symptoms since discharge. Discussed AFib recurrence risk due to enlarged heart and age. Explained ablation as a firebreak. Discussed potential digoxin  discontinuation if normal rhythm maintained by December 5th, 2025. Emphasized Eliquis  and metoprolol  continuation. - Continue Eliquis  5 mg twice daily. - Continue metoprolol  XL 50 mg, 1.5 tablets twice daily. - Continue digoxin  0.0625 mg daily. - Follow up with AFib clinic on December 5th, 2025. - Discuss potential discontinuation of digoxin  with Dr. Kennyth if maintaining normal rhythm by December 5th, 2025.

## 2024-07-07 NOTE — Assessment & Plan Note (Signed)
 Mildly elevated blood pressure today, will continue to monitor.  If pressures remain elevated in upcoming visits, we can add ARB

## 2024-07-21 ENCOUNTER — Ambulatory Visit: Attending: Family Medicine

## 2024-07-21 DIAGNOSIS — R262 Difficulty in walking, not elsewhere classified: Secondary | ICD-10-CM | POA: Insufficient documentation

## 2024-07-21 DIAGNOSIS — M6281 Muscle weakness (generalized): Secondary | ICD-10-CM | POA: Insufficient documentation

## 2024-07-21 DIAGNOSIS — M5459 Other low back pain: Secondary | ICD-10-CM | POA: Insufficient documentation

## 2024-07-21 DIAGNOSIS — R252 Cramp and spasm: Secondary | ICD-10-CM | POA: Diagnosis present

## 2024-07-21 DIAGNOSIS — R293 Abnormal posture: Secondary | ICD-10-CM | POA: Diagnosis present

## 2024-07-21 DIAGNOSIS — R2681 Unsteadiness on feet: Secondary | ICD-10-CM | POA: Diagnosis present

## 2024-07-21 NOTE — Therapy (Signed)
 OUTPATIENT PHYSICAL THERAPY THORACOLUMBAR TREATMENT Progress Note Reporting Period 04/15/24 to 07/02/24  See note below for Objective Data and Assessment of Progress/Goals.       Patient Name: Michelle Wilcox MRN: [id number] DOB:Oct 16, 1945, 78 y.o., female Today's Date: 07/21/2024  END OF SESSION:  PT End of Session - 07/21/24 0853     Visit Number 11    Date for Recertification  09/03/24    Authorization Type BCBS Medicare    PT Start Time 0850    PT Stop Time 0931    PT Time Calculation (min) 41 min    Activity Tolerance Patient tolerated treatment well    Behavior During Therapy WFL for tasks assessed/performed              Past Medical History:  Diagnosis Date   Anxiety    hx of   Asthma    at times   CHF (congestive heart failure) (HCC)    Depression    hx of   Dyspnea    Giant cell arteritis (HCC)    Gouty arthropathy    Hemolytic anemia    Hyperlipidemia    Hypertension    Irregular heart beat    extra beat   Migraines    Polymyalgia    Polymyalgia rheumatica    Temporal arteritis (HCC)    Past Surgical History:  Procedure Laterality Date   ATRIAL FIBRILLATION ABLATION N/A 06/26/2024   Procedure: ATRIAL FIBRILLATION ABLATION;  Surgeon: Kennyth Chew, MD;  Location: [facility];  Service: Cardiovascular;  Laterality: N/A;   BARTHOLIN GLAND CYST EXCISION     non ca   BREAST BIOPSY Right    CARDIOVERSION N/A 11/05/2023   Procedure: CARDIOVERSION;  Surgeon: Sheena Pugh, DO;  Location: [facility];  Service: Cardiovascular;  Laterality: N/A;   CARDIOVERSION N/A 12/19/2023   Procedure: CARDIOVERSION;  Surgeon: Santo Stanly LABOR, MD;  Location: [facility];  Service: Cardiovascular;  Laterality: N/A;   EYE MUSCLE SURGERY     as a child 59 yrs old   KNEE SURGERY  2004   rt knee   LAPAROSCOPIC SPLENECTOMY N/A 06/18/2018   Procedure: LAPAROSCOPIC SPLENECTOMY ERAS PATHWAY;  Surgeon: Mikell Katz, MD;  Location: [facility];  Service: General;  Laterality: N/A;   RIGHT/LEFT HEART CATH AND CORONARY ANGIOGRAPHY N/A 03/04/2024   Procedure: RIGHT/LEFT HEART CATH AND CORONARY ANGIOGRAPHY;  Surgeon: Anner Alm ORN, MD;  Location: [facility] INVAS[facility] CV LAB;  Service: Cardiovascular;  Laterality: N/A;   toncil     TONSILLECTOMY     as a child   TRANSESOPHAGEAL ECHOCARDIOGRAM (CATH LAB) N/A 11/05/2023   Procedure: TRANSESOPHAGEAL ECHOCARDIOGRAM;  Surgeon: Sheena Pugh, DO;  Location: [facility];  Service: Cardiovascular;  Laterality: N/A;   TRANSESOPHAGEAL ECHOCARDIOGRAM (CATH LAB) N/A 12/19/2023   Procedure: TRANSESOPHAGEAL ECHOCARDIOGRAM;  Surgeon: Santo Stanly LABOR, MD;  Location: [facility];  Service: Cardiovascular;  Laterality: N/A;   Patient Active Problem List   Diagnosis Date Noted   Hypercoagulable state due to paroxysmal atrial fibrillation (HCC) 07/07/2024   Nonischemic cardiomyopathy (HCC) 12/24/2023   Pericardial effusion 11/07/2023   Borderline abnormal TFTs 11/05/2023   Macrocytosis 11/05/2023   Paroxysmal atrial fibrillation (HCC) 11/04/2023   PMR (polymyalgia rheumatica) 11/04/2023   Primary localized osteoarthrosis of multiple sites 06/15/2022   Diastolic dysfunction without heart failure 03/05/2018   DOE (dyspnea on exertion) 03/05/2018   Preop cardiovascular exam 03/05/2018   Mild reactive airways disease 08/04/2017  Hypokalemia 08/03/2017   Essential hypertension 08/03/2017   Hyperlipidemia 08/03/2017   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   Hypersensitivity reaction 11/20/2016   Drusen (degenerative) of retina, bilateral 05/22/2016   Nuclear cataract 05/22/2016   Retinal hemorrhage of left eye 05/22/2016   Temporal arteritis (HCC) 05/22/2016   Hemolytic anemia 03/19/2016   Breast mass, right 07/01/2012    PCP: Aisha Harvey, MD'  REFERRING PROVIDER: Aisha Harvey, MD   REFERRING DIAG: M81.0 (ICD-10-CM) - Age-related osteoporosis without current  pathological fracture   Rationale for Evaluation and Treatment: Rehabilitation  THERAPY DIAG:  Difficulty in walking, not elsewhere classified  Unsteadiness on feet  Abnormal posture  Muscle weakness (generalized)  Cramp and spasm  Other low back pain  ONSET DATE: 11/04/23  SUBJECTIVE:                                                                                                                                                                                           SUBJECTIVE STATEMENT: Patient reports I'm still out of afib!  Reports she is feeling a lot better.  I feel like I can do more today      Eval: Here for OP ,She is still in Afib all the time (see history below). They want her to have an ablation now, before the end of the year. Seeing cardiologist tomorrow. She has been back to work since Aug 2 at [employer]. 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 Working 32 hours. Would like 1x/wk. She is challenged with back to back tours as she is talking. She still feels unsteady at times. It depends on the day. Standing is limited to 10 minutes due to fatigue.   PERTINENT HISTORY:   Vertigo, 12/31/2023:  Pt underwent attempt at cardioversion for a-fib on 12/19/23, which did not last >24 hrs; was hospitalized after ED visit for SOB, CHF.  Anxiety, asthma, CHF, depression, gouty arthropathy, anemia, HLD, HTN, migraines, polymyalgia rheumatica, temporal arteritis, R knee surgery   PAIN:  Are you having pain? No  PRECAUTIONS: None  RED FLAGS: None   WEIGHT BEARING RESTRICTIONS: No  FALLS:  Has patient fallen in last 6 months? No  LIVING ENVIRONMENT: Lives with: lives with their spouse who have stage 4 stomach CA (pt reports that she is having to physically assist him some) Lives in: Other townhouse  Stairs: 2 stories, sleeps on 1st floor in recliner  Has following equipment at home: Vannie - 2 wheeled, cane  OCCUPATION: works 32 hours per  week  PLOF: Independent with basic ADLs, Independent with household mobility without device, Independent with community mobility with  device, and Vocation/Vocational requirements: walking and talking  PATIENT GOALS: Help to do with osteoporosis and balance still  NEXT MD VISIT: unsure  OBJECTIVE:  Note: Objective measures were completed at Evaluation unless otherwise noted.  DIAGNOSTIC FINDINGS:  None recent  PATIENT SURVEYS:  PSFS: THE PATIENT SPECIFIC FUNCTIONAL SCALE  Place score of 0-10 (0 = unable to perform activity and 10 = able to perform activity at the same level as before injury or problem)  Activity Date: 04/15/24 Date: 06/11/24 Date: 07/02/24 Patient just had ablation and is quite limited from a cardiac standpoint  Standing for conversation or tasks unassisted   2     5   7   2.endurance with walking   4     6   6   3. Managing grocery store runs (including unloading and putting things away)   3     5    2   4. Picking up 10# or more and carry   2     5 0  Total Score 2.75 5.25     Total Score = Sum of activity scores/number of activities  Minimally Detectable Change: 3 points (for single activity); 2 points (for average score)  [facility] (nd). The Patient Specific Functional Scale . Retrieved from [email or web address].com.pt   COGNITION: Overall cognitive status: Within functional limits for tasks assessed     SENSATION: WFL   LUMBAR ROM: WNL for tasks assessed   LOWER EXTREMITY ROM:    WNL for tasks assessed   LOWER EXTREMITY MMT:  4 to 5/5 B tested in sitting    FUNCTIONAL TESTS:  5 times sit to stand: 17.01 sec  06/23/24: 5 times sit to stand: 18.39 sec (a little worse but patient preparing for ablation) TUG 06/23/24: TUG: 11.71 sec 6 minute walk test: 923 ft RPE 5/10 vitals PR 94 bpm, O2 sats 90%   TUG 07/02/24: TUG: 14.19 sec 07/02/24 5 times sit to stand: 30.33 sec   Functional  gait assessment:  FUNCTIONAL GAIT ASSESSMENT  Date: 04/29/24 Score  GAIT LEVEL SURFACE Instructions: Walk at your normal speed from here to the next mark (6 m) [20 ft]. (2) Mild impairment - Walks 6 m (20 ft) in less than 7 seconds but greater than 5.5 seconds, uses assistive device, slower speed, mild gait deviations, or deviates 15.24 -25.4 cm (6 -10 in) outside of the 30.48-cm (12-in) walkway width.  2.   CHANGE IN GAIT SPEED Instructions: Begin walking at your normal pace (for 1.5 m [5 ft]). When I tell you "go," walk as fast as you can (for 1.5 m [5 ft]). When I tell you "slow," walk as slowly as you can (for 1.5 m [5 ft]. (2) Mild impairment - Is able to change speed but demonstrates mild gait deviations, deviates 15.24 -25.4 cm (6 -10 in) outside of the 30.48-cm (12-in) walkway width, or no gait deviations but unable to achieve a significant change in velocity, or uses an assistive device  3.    GAIT WITH HORIZONTAL HEAD TURNS Instructions: Walk from here to the next mark 6 m (20 ft) away. Begin walking at your normal pace. Keep walking straight; after 3 steps, turn your head to the right and keep walking straight while looking to the right. After 3 more steps, turn your head to the left and keep walking straight while looking left. Continue alternating looking right and left. (2) Mild impairment - Performs head turns smoothly with slight change in gait velocity (eg, minor  disruption to smooth gait path), deviates 15.24 -25.4 cm (6 -10 in) outside 30.48-cm (12-in) walkway width, or uses an assistive device.  4.   GAIT WITH VERTICAL HEAD TURNS Instructions: Walk from here to the next mark (6 m [20 ft]). Begin walking at your normal pace. Keep walking straight; after 3 steps, tip your head up and keep walking straight while looking up. After 3 more steps, tip your head down, keep walking straight while looking down. Continue  alternating looking up and down every 3 steps until you have completed 2  repetitions in each direction. (2) Mild impairment - Performs task with slight change in gait velocity (eg, minor disruption to smooth gait path), deviates 15.24 -25.4 cm (6 -10 in) outside 30.48-cm (12-in) walkway width or uses assistive device.  5.  GAIT AND PIVOT TURN Instructions: Begin with walking at your normal pace. When I tell you, "turn and stop," turn as quickly as you can to face the opposite direction and stop. (2) Mild impairment - Pivot turns safely in 3 seconds and stops with no loss of balance, or pivot turns safely within 3 seconds and stops with mild imbalance, requires small steps to catch balance  6.   STEP OVER OBSTACLE Instructions: Begin walking at your normal speed. When you come to the shoe box, step over it, not around it, and keep walking. (2) Mild impairment - Is able to step over one shoe box (11.43 cm [4.5 in] total height) without changing gait speed; no evidence of imbalance.  7.   GAIT WITH NARROW BASE OF SUPPORT Instructions: Walk on the floor with arms folded across the chest, feet aligned heel to toe in tandem for a distance of 3.6 m [12 ft]. The number of steps taken in a straight line are counted for a maximum of 10 steps. (2) Mild impairment - Ambulates 7-9 steps  8.   GAIT WITH EYES CLOSED Instructions: Walk at your normal speed from here to the next mark (6 m [20 ft]) with your eyes closed. (1) Moderate impairment - Walks 6 m (20 ft), slow speed, abnormal gait pattern, evidence for imbalance, deviates 25.4 -38.1 cm (10 -15 in) outside 30.48-cm (12-in) walkway width. Requires more than 9 seconds to ambulate 6 m (20 ft).  9.   AMBULATING BACKWARDS Instructions: Walk backwards until I tell you to stop (2) Mild impairment - Walks 6 m (20 ft), uses assistive device, slower speed, mild gait deviations, deviates 15.24 -25.4 cm (6 -10 in) outside 30.48-cm (12-in) walkway width  10. STEPS Instructions: Walk up these stairs as you would at home (ie, using the rail if  necessary). At the top turn around and walk down. (1) Moderate impairment-Two feet to a stair; must use rail.  Total 18/30   Interpretation of scores: Non-Specific Older Adults Cutoff Score: <=22/30 = risk of falls Parkinson's Disease Cutoff score <15/30= fall risk (Hoehn & Yahr 1-4)  Minimally Clinically Important Difference (MCID)  Stroke (acute, subacute, and chronic) = MDC: 4.2 points Vestibular (acute) = MDC: 6 points Community Dwelling Older Adults =  MCID: 4 points Parkinson's Disease  =  MDC: 4.3 points  ([employer]y [employer] (nd). Functional Gait Assessment. Retrieved from https://www.neuropt.org/docs/default-source/cpgs/core-outcome-measures/function-gait-assessment-pocket-guide-proof9-(2).pdf?sfvrsn=b48f35043_0.)  Interpretation of scores: Non-Specific Older Adults Cutoff Score: <=22/30 = risk of falls Parkinson's Disease Cutoff score <15/30= fall risk (Hoehn & Yahr 1-4)  Minimally Clinically Important Difference (MCID)  Stroke (acute, subacute, and chronic) = MDC: 4.2 points Vestibular (acute) = MDC: 6 points Community  Dwelling Older Adults =  MCID: 4 points Parkinson's Disease  =  MDC: 4.3 points  (Academy of Neurologic Physical Therapy (nd). Functional Gait Assessment. Retrieved from https://www.neuropt.org/docs/default-source/cpgs/core-outcome-measures/function-gait-assessment-pocket-guide-proof9-(2).pdf?sfvrsn=b26f35043_0.)  GAIT: Safe gait patten with walking stick  TREATMENT DATE:                                                                                                                               07/21/24 Nustep level 5 x 5 min Seated piriformis stretch 3 x 30 sec each LE Sit to stand x 5 Squat to table x 5 Steam engines x 20 with 2# Seated bicep curls 2 x 10 with 2# Seated tricep press with red tband 2 x 10 each UE Seated UE punches x 20 with 2# Seated hamstring curl with red tband 2 x 10 each LE At barre: 6 inch step up x 10 each LE  fwd then lateral Lateral band walks with blue loop x 5 laps Seated clam with blue loop x 20 Seated mini sit up 2 x 10 with 5# kb Seated modified Russian twist with 5# kb x 20 Seated hip to shoulder 2 x 10 each side with 5# kb  07/02/24  10th visit re-assessment completed Seated biceps curl 2# 2x 10 Seated steam engines 3# 2 x 10 bilateral  5 TSTS and TUG completed Seated tricep press with red tband 2 x 10 each UE Patient was fatigued: we held on further treatment today  06/23/24 (patient states surgeon called as she was walking out the door.  She was about 10 min late for appt by time of check-in) NuStep Level 5  x5 min- PT present to discuss status Seated lateral trunk stretch over peanut ball x 10 hold 5 sec both sides due to S curve scoliosis Seated 3D ball roll outs x 10 each direction holding 5 sec each Patient explained that she had to call 911 yesterday evening: her vision went blurry and her BP was up.  Emergency services evaluated her and suggested she go to hospital but she recalled that she had not taken her anti-anxiety medication and she has hx of ocular migraines that are not painful.  She felt this was what was happening.  She had no s/s of CVA.  She declined transport to hospital and all symptoms resolved. Seated reclined crunch 6# db 2 x 10 Seated ear to hip with 6# db x 10 each side Seated biceps curl 3# 2x 10 Seated steam engines 3# 2 x 10 bilateral  5 TSTS and TUG completed Seated tricep press with yellow tband 2 x 10 each UE  PATIENT EDUCATION:  Education details: PT eval findings, anticipated POC, and HEP review   Person educated: Patient Education method: verbally discussed Education comprehension: verbalized understanding  HOME EXERCISE PROGRAM: Access Code: [email or web address] URL: [email or web address] Date: 04/15/2024 Prepared by: Mliss (previous HEP from recent episode)  Exercises - Sit to Stand with Arms Crossed  - 1 x daily -  5 x weekly - 2  sets - 10 reps - Standing Hamstring Curl with Chair Support  - 1 x daily - 7 x weekly - 3 sets - 10-15 reps - Standing March with Counter Support  - 1 x daily - 7 x weekly - 2-3 sets - 1 min hold - Side Stepping with Counter Support  - 1 x daily - 5 x weekly - 2 sets - 1 min hold - Forward Step Up  - 1 x daily - 5 x weekly - 2-3 sets - 30 sec hold - Walking with Eyes Closed and Counter Support  - 1 x daily - 5 x weekly - 2 sets - 5 reps - Standing Near Stance in Corner with Eyes Closed  - 1 x daily - 5 x weekly - 3 sets - 30 sec hold  ASSESSMENT:  CLINICAL IMPRESSION: Lauriana was able to do a lot more today with minimal fatigue.  She has some difficulty with functional strengthening due to her right knee issue but completed all reps with only mild right knee pain.  Her energy and effort were significantly improved today.   She would benefit from continued skilled PT to address the below impairments and improve overall function.   Eval: Patient is a 78 y.o. female who was seen today for physical therapy evaluation and treatment for osteoporosis and fall prevention . She was recently discharged from neuro PT due to complications from Afib. She has recently returned to work and has deficits with endurance with standing and walking  affecting work and grocery shopping. She also reports deficits with carrying over 10#. She denies pain. She continues to report balance deficits, but states her balance is much improved and it depends on the day. She will benefit from skilled PT to address these deficits and those listed below. Due to her RTW she will only be available 1x/wk.  OBJECTIVE IMPAIRMENTS: cardiopulmonary status limiting activity, decreased activity tolerance, decreased balance, difficulty walking, and decreased strength.   ACTIVITY LIMITATIONS: carrying, lifting, standing, and locomotion level  PARTICIPATION LIMITATIONS: laundry, shopping, and occupation  PERSONAL FACTORS: Age, Fitness,  Past/current experiences, Time since onset of injury/illness/exacerbation, and 3+ comorbidities: OP, Afib, vertigo are also affecting patient's functional outcome.   REHAB POTENTIAL: Good  CLINICAL DECISION MAKING: Evolving/moderate complexity  EVALUATION COMPLEXITY: Moderate   GOALS: Goals reviewed with patient? Yes  SHORT TERM GOALS: Target date: 05/27/24  Ind with initial HEP Baseline: Goal status: MET 06/11/24  2.  Decreased TUG by 2-3 seconds showing decreased risk for falls. Baseline: 13.77 sec Goal status: In progress  3.  Decreased 5XSTS by 2-3 seconds showing functional improvement in strength Baseline:  Goal status: In progress   LONG TERM GOALS: Target date: 07/08/24  Ind with advanced HEP Baseline:  Goal status: INITIAL  2.  Improved average PSFS by 2 points showing functional improvement Baseline: 2.75 Goal status: MET 06/11/24  3.  Patient to report improved endurance with grocery shopping by 50%  Baseline:  Goal status: In Progress  4.  Improved to 1,100 ft or more with device as needed Baseline: 923 ft Goal status: In Progress  5.  FGA to improve at least 4 points demonstrating decreased risk for falls Baseline: TBD Goal status: INITIAL  6.  Able to carry >= 10# x 25 ft showing improved functional UE strength Baseline:  Goal status: INITIAL  7. Improved standing tolerance to > 15-20 min without AD Baseline:  Goal status: INITIAL  PLAN:  PT  FREQUENCY: 1x/week  PT DURATION: 12 weeks  PLANNED INTERVENTIONS: [id number]- PT Re-evaluation, 97110-Therapeutic exercises, 97530- Therapeutic activity, [id number]- Neuromuscular re-education, 97535- Self Care, [id number]- Manual therapy, [phone]- Gait training, Patient/Family education, Balance training, and Stair training.  PLAN FOR NEXT SESSION:  Gait endurance, resistive training, balance, functional LE and core strength for osteoporosis/balance   Careen Mauch B. Vaneta Hammontree, PT 07/21/24 9:32 AM [facility]  Specialty Re[facility] [address] [place], [place] [address] Phone # [phone] Fax [phone]

## 2024-07-21 NOTE — Addendum Note (Signed)
 Addended by: HARVEY NEST B on: 07/21/2024 08:59 AM   Modules accepted: Orders

## 2024-07-24 ENCOUNTER — Ambulatory Visit (HOSPITAL_COMMUNITY)
Admission: RE | Admit: 2024-07-24 | Discharge: 2024-07-24 | Disposition: A | Source: Ambulatory Visit | Attending: Physician Assistant | Admitting: Physician Assistant

## 2024-07-24 VITALS — BP 138/70 | HR 64 | Ht 63.0 in | Wt 170.2 lb

## 2024-07-24 DIAGNOSIS — D6869 Other thrombophilia: Secondary | ICD-10-CM

## 2024-07-24 DIAGNOSIS — I4891 Unspecified atrial fibrillation: Secondary | ICD-10-CM | POA: Diagnosis not present

## 2024-07-24 DIAGNOSIS — I4819 Other persistent atrial fibrillation: Secondary | ICD-10-CM

## 2024-07-24 NOTE — Progress Notes (Signed)
 Primary Care Physician: Aisha Harvey, MD Primary Cardiologist: Alm Clay, MD Electrophysiologist: Fonda Kitty, MD  Referring Physician: Dr Kitty Handing Jinelle Butchko is a 78 y.o. female with a history of HTN, HLD, giant cell arteritis, autoimmune hemolytic anemia s/p splenectomy, atrial fibrillation who presents for follow up in the [facility]. Patient was admitted in 10/2023 for [facility] after a cortisone shot in her knee. During that admission, she was planned for cardioversion but TEE showed left atrial appendage thrombus. She was started on amiodarone , eliquis , metoprolol  and digoxin . The patient presented on 12/20/23 for TEE with DCCV. LVEF was 65% and no further thrombus noted, LA/RA severely dilated with mod-sev functional MR. She cardioverted with shock x1 at 200j. Seen at the ED again 12/21/23 with palpitations and SOB, found to be back in afib. She was started on IV amiodarone  with conversion to sinus rhythm with PACs. After discharge, she wore a 2 week Zio monitor which showed 100% afib burden. She was seen by Dr Kitty and underwent afib ablation on 06/26/24. Patient is on Eliquis  for stroke prevention.    Patient presents today for follow up for atrial fibrillation. She remains in SR today and feels well. She denies any interim symptoms of afib. She states her SOB with exertion has resolved. She denies chest pain or groin issues.   Today, she denies symptoms of palpitations, chest pain, shortness of breath, orthopnea, PND, lower extremity edema, dizziness, presyncope, syncope, snoring, daytime somnolence, bleeding, or neurologic sequela. The patient is tolerating medications without difficulties and is otherwise without complaint today.    Atrial Fibrillation Risk Factors:  she does not have symptoms or diagnosis of sleep apnea. she does not have a history of rheumatic fever.   Atrial Fibrillation Management history:  Previous antiarrhythmic  drugs: amiodarone  Previous cardioversions: 12/19/23 Previous ablations: 06/26/24 Anticoagulation history: Eliquis   ROS- All systems are reviewed and negative except as per the HPI above.  Past Medical History:  Diagnosis Date   Anxiety    hx of   Asthma    at times   CHF (congestive heart failure) (HCC)    Depression    hx of   Dyspnea    Giant cell arteritis (HCC)    Gouty arthropathy    Hemolytic anemia    Hyperlipidemia    Hypertension    Irregular heart beat    extra beat   Migraines    Polymyalgia    Polymyalgia rheumatica    Temporal arteritis (HCC)     Current Outpatient Medications  Medication Sig Dispense Refill   acetaminophen  (TYLENOL ) 650 MG CR tablet Take 1,300 mg by mouth 2 (two) times daily.     apixaban  (ELIQUIS ) 5 MG TABS tablet TAKE 1 TABLET(5 MG) BY MOUTH TWICE DAILY 60 tablet 5   Bacitracin-Polymyxin B (NEOSPORIN EX) Apply 1 Application topically daily as needed (area on toe).     betamethasone 0.5% cream-vitamin a&d ointment 1:1 mixture Apply 1 Application topically daily as needed for irritation.     cholecalciferol  (VITAMIN D3) 25 MCG (1000 UNIT) tablet Take 1,000 Units by mouth every other day.     clindamycin (CLEOCIN) 150 MG capsule Take 600 mg by mouth See admin instructions. Take prior to Dental Procedures     diclofenac  Sodium (VOLTAREN ) 1 % GEL Apply 4 g topically as needed (Hands, knees, feet).     Emollient (LUBRIDERM) LOTN Apply 1 Application topically daily as needed.     folic acid  (FOLVITE )  1 MG tablet TAKE 1 TABLET(1 MG) BY MOUTH DAILY 90 tablet 3   furosemide  (LASIX ) 20 MG tablet Take 1 tablet (20 mg total) by mouth daily. Take an additional dose once a day as needed for lower extremity Edema. 90 tablet 3   gabapentin  (NEURONTIN ) 100 MG capsule Take 200 mg by mouth 2 (two) times daily.     lactase (LACTAID) 3000 units tablet Take 9,000-15,000 Units by mouth as needed (consuming dairy products).     loratadine  (CLARITIN ) 10 MG tablet Take  10 mg by mouth daily.     metoprolol  succinate (TOPROL -XL) 50 MG 24 hr tablet Take 1.5 tablets (75 mg total) by mouth in the morning and at bedtime. Take with or immediately following a meal. 270 tablet 3   Multiple Vitamin (MULTIVITAMIN) tablet Take 1 tablet by mouth at bedtime. One-A-Day Women's Vitamin     potassium chloride  SA (KLOR-CON  M) 20 MEQ tablet Take 10 mEq by mouth daily.     predniSONE  (DELTASONE ) 5 MG tablet Take 1 tablet (5 mg total) by mouth daily with breakfast. 90 tablet 3   Probiotic Product (ALIGN) 4 MG CAPS Take 4 mg by mouth every morning.     rosuvastatin  (CRESTOR ) 5 MG tablet Take 5 mg by mouth every other day.     VENTOLIN  HFA 108 (90 Base) MCG/ACT inhaler Take 1-2 puffs by mouth every 4 (four) hours as needed for shortness of breath.  0   ALPRAZolam  (XANAX ) 0.25 MG tablet Take 0.25 mg by mouth 2 (two) times daily as needed for anxiety. (Patient not taking: Reported on 07/24/2024)     No current facility-administered medications for this encounter.    Physical Exam: BP 138/70   Pulse 64   Ht 5' 3 (1.6 m)   Wt 77.2 kg   BMI 30.15 kg/m   GEN: Well nourished, well developed in no acute distress CARDIAC: Regular rate and rhythm, no murmurs, rubs, gallops RESPIRATORY:  Clear to auscultation without rales, wheezing or rhonchi  ABDOMEN: Soft, non-tender, non-distended EXTREMITIES:  No edema; No deformity   Wt Readings from Last 3 Encounters:  07/24/24 77.2 kg  07/07/24 78.4 kg  06/26/24 78.5 kg     EKG Interpretation Date/Time:  Friday July 24 2024 11:25:58 EST Ventricular Rate:  64 PR Interval:  132 QRS Duration:  82 QT Interval:  398 QTC Calculation: 410 R Axis:   -6  Text Interpretation: Normal sinus rhythm When compared with ECG of 07-Jul-2024 08:58, No significant change was found Confirmed by Ayaansh Smail ([id number]) on 07/24/2024 11:36:12 AM    Echo 12/22/23 demonstrated   1. Left ventricular ejection fraction, by estimation, is 60 to 65%. The   left ventricle has normal function. The left ventricle has no regional  wall motion abnormalities. Left ventricular diastolic parameters are  indeterminate.   2. Right ventricular systolic function is normal. The right ventricular  size is normal. Tricuspid regurgitation signal is inadequate for assessing  PA pressure.   3. Left atrial size was moderately dilated.   4. The mitral valve is normal in structure. Trivial mitral valve  regurgitation. No evidence of mitral stenosis.   5. The aortic valve is tricuspid. Aortic valve regurgitation is mild to  moderate. No aortic stenosis is present.   6. The inferior vena cava is dilated in size with >50% respiratory  variability, suggesting right atrial pressure of 8 mmHg.     CHA2DS2-VASc Score = 6  The patient's score is based upon: CHF History:  1 HTN History: 1 Diabetes History: 0 Stroke History: 0 Vascular Disease History: 1 (Aortic atherosclerosis.) Age Score: 2 Gender Score: 1       ASSESSMENT AND PLAN: Persistent Atrial Fibrillation (ICD10:  I48.19) The patient's CHA2DS2-VASc score is 6, indicating a 9.7% annual risk of stroke.   S/p afib ablation 06/26/24 Patient appears to be maintaining SR with symptomatic improvement.  Continue Eliquis  5 mg BID with no missed doses for 3 months post ablation.  Stop digoxin  Continue Toprol  75 mg BID  Secondary Hypercoagulable State (ICD10:  D68.69) The patient is at significant risk for stroke/thromboembolism based upon her CHA2DS2-VASc Score of 6.  Continue Apixaban  (Eliquis ). No bleeding issues.   HTN Stable on current regimen    Follow up with Charlies Arthur as scheduled.     [facility] [address] [place], [place] [address] [phone]

## 2024-07-30 ENCOUNTER — Ambulatory Visit

## 2024-07-30 DIAGNOSIS — R2681 Unsteadiness on feet: Secondary | ICD-10-CM

## 2024-07-30 DIAGNOSIS — R262 Difficulty in walking, not elsewhere classified: Secondary | ICD-10-CM | POA: Diagnosis not present

## 2024-07-30 DIAGNOSIS — M5459 Other low back pain: Secondary | ICD-10-CM

## 2024-07-30 DIAGNOSIS — R252 Cramp and spasm: Secondary | ICD-10-CM

## 2024-07-30 DIAGNOSIS — R293 Abnormal posture: Secondary | ICD-10-CM

## 2024-07-30 DIAGNOSIS — M6281 Muscle weakness (generalized): Secondary | ICD-10-CM

## 2024-07-30 NOTE — Therapy (Signed)
 OUTPATIENT PHYSICAL THERAPY THORACOLUMBAR TREATMENT Progress Note Reporting Period 04/15/24 to 07/02/24  See note below for Objective Data and Assessment of Progress/Goals.       Patient Name: Michelle Wilcox MRN: [id number] DOB:1946/03/11, 78 y.o., female Today's Date: 07/30/2024  END OF SESSION:  PT End of Session - 07/30/24 1535     Visit Number 12    Date for Recertification  09/03/24    Authorization Type BCBS Medicare    Progress Note Due on Visit 20    PT Start Time 1535    PT Stop Time 1615    PT Time Calculation (min) 40 min    Activity Tolerance Patient tolerated treatment well    Behavior During Therapy WFL for tasks assessed/performed              Past Medical History:  Diagnosis Date   Anxiety    hx of   Asthma    at times   CHF (congestive heart failure) (HCC)    Depression    hx of   Dyspnea    Giant cell arteritis (HCC)    Gouty arthropathy    Hemolytic anemia    Hyperlipidemia    Hypertension    Irregular heart beat    extra beat   Migraines    Polymyalgia    Polymyalgia rheumatica    Temporal arteritis (HCC)    Past Surgical History:  Procedure Laterality Date   ATRIAL FIBRILLATION ABLATION N/A 06/26/2024   Procedure: ATRIAL FIBRILLATION ABLATION;  Surgeon: Kennyth Chew, MD;  Location: [facility];  Service: Cardiovascular;  Laterality: N/A;   BARTHOLIN GLAND CYST EXCISION     non ca   BREAST BIOPSY Right    CARDIOVERSION N/A 11/05/2023   Procedure: CARDIOVERSION;  Surgeon: Sheena Pugh, DO;  Location: [facility];  Service: Cardiovascular;  Laterality: N/A;   CARDIOVERSION N/A 12/19/2023   Procedure: CARDIOVERSION;  Surgeon: Santo Stanly LABOR, MD;  Location: [facility];  Service: Cardiovascular;  Laterality: N/A;   EYE MUSCLE SURGERY     as a child 49 yrs old   KNEE SURGERY  2004   rt knee   LAPAROSCOPIC SPLENECTOMY N/A 06/18/2018   Procedure: LAPAROSCOPIC SPLENECTOMY ERAS PATHWAY;  Surgeon:  Mikell Katz, MD;  Location: [facility];  Service: General;  Laterality: N/A;   RIGHT/LEFT HEART CATH AND CORONARY ANGIOGRAPHY N/A 03/04/2024   Procedure: RIGHT/LEFT HEART CATH AND CORONARY ANGIOGRAPHY;  Surgeon: Anner Alm ORN, MD;  Location: [facility];  Service: Cardiovascular;  Laterality: N/A;   toncil     TONSILLECTOMY     as a child   TRANSESOPHAGEAL ECHOCARDIOGRAM (CATH LAB) N/A 11/05/2023   Procedure: TRANSESOPHAGEAL ECHOCARDIOGRAM;  Surgeon: Sheena Pugh, DO;  Location: [facility];  Service: Cardiovascular;  Laterality: N/A;   TRANSESOPHAGEAL ECHOCARDIOGRAM (CATH LAB) N/A 12/19/2023   Procedure: TRANSESOPHAGEAL ECHOCARDIOGRAM;  Surgeon: Santo Stanly LABOR, MD;  Location: [facility];  Service: Cardiovascular;  Laterality: N/A;   Patient Active Problem List   Diagnosis Date Noted   Hypercoagulable state due to paroxysmal atrial fibrillation (HCC) 07/07/2024   Nonischemic cardiomyopathy (HCC) 12/24/2023   Pericardial effusion 11/07/2023   Borderline abnormal TFTs 11/05/2023   Macrocytosis 11/05/2023   Paroxysmal atrial fibrillation (HCC) 11/04/2023   PMR (polymyalgia rheumatica) 11/04/2023   Primary localized osteoarthrosis of multiple sites 06/15/2022   Diastolic dysfunction without heart failure 03/05/2018   DOE (dyspnea on exertion) 03/05/2018   Preop cardiovascular exam 03/05/2018  Mild reactive airways disease 08/04/2017   Hypokalemia 08/03/2017   Essential hypertension 08/03/2017   Hyperlipidemia 08/03/2017   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   Hypersensitivity reaction 11/20/2016   Drusen (degenerative) of retina, bilateral 05/22/2016   Nuclear cataract 05/22/2016   Retinal hemorrhage of left eye 05/22/2016   Temporal arteritis (HCC) 05/22/2016   Hemolytic anemia 03/19/2016   Breast mass, right 07/01/2012    PCP: Aisha Harvey, MD'  REFERRING PROVIDER: Aisha Harvey, MD   REFERRING DIAG: M81.0 (ICD-10-CM) -  Age-related osteoporosis without current pathological fracture   Rationale for Evaluation and Treatment: Rehabilitation  THERAPY DIAG:  Difficulty in walking, not elsewhere classified  Unsteadiness on feet  Abnormal posture  Muscle weakness (generalized)  Cramp and spasm  Other low back pain  ONSET DATE: 11/04/23  SUBJECTIVE:                                                                                                                                                                                           SUBJECTIVE STATEMENT: Patient reports       Eval: Here for OP ,She is still in Afib all the time (see history below). They want her to have an ablation now, before the end of the year. Seeing cardiologist tomorrow. She has been back to work since Aug 2 at [employer]. 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 Working 32 hours. Would like 1x/wk. She is challenged with back to back tours as she is talking. She still feels unsteady at times. It depends on the day. Standing is limited to 10 minutes due to fatigue.   PERTINENT HISTORY:   Vertigo, 12/31/2023:  Pt underwent attempt at cardioversion for a-fib on 12/19/23, which did not last >24 hrs; was hospitalized after ED visit for SOB, CHF.  Anxiety, asthma, CHF, depression, gouty arthropathy, anemia, HLD, HTN, migraines, polymyalgia rheumatica, temporal arteritis, R knee surgery   PAIN:  Are you having pain? No  PRECAUTIONS: None  RED FLAGS: None   WEIGHT BEARING RESTRICTIONS: No  FALLS:  Has patient fallen in last 6 months? No  LIVING ENVIRONMENT: Lives with: lives with their spouse who have stage 4 stomach CA (pt reports that she is having to physically assist him some) Lives in: Other townhouse  Stairs: 2 stories, sleeps on 1st floor in recliner  Has following equipment at home: Vannie - 2 wheeled, cane  OCCUPATION: works 32 hours per week  PLOF: Independent with basic ADLs, Independent with  household mobility without device, Independent with community mobility with device, and Vocation/Vocational requirements: walking and talking  PATIENT GOALS: Help to do with  osteoporosis and balance still  NEXT MD VISIT: unsure  OBJECTIVE:  Note: Objective measures were completed at Evaluation unless otherwise noted.  DIAGNOSTIC FINDINGS:  None recent  PATIENT SURVEYS:  PSFS: THE PATIENT SPECIFIC FUNCTIONAL SCALE  Place score of 0-10 (0 = unable to perform activity and 10 = able to perform activity at the same level as before injury or problem)  Activity Date: 04/15/24 Date: 06/11/24 Date: 07/02/24 Patient just had ablation and is quite limited from a cardiac standpoint  Standing for conversation or tasks unassisted   2     5   7   2.endurance with walking   4     6   6   3. Managing grocery store runs (including unloading and putting things away)   3     5    2   4. Picking up 10# or more and carry   2     5 0  Total Score 2.75 5.25     Total Score = Sum of activity scores/number of activities  Minimally Detectable Change: 3 points (for single activity); 2 points (for average score)  [facility] (nd). The Patient Specific Functional Scale . Retrieved from [email or web address]   COGNITION: Overall cognitive status: Within functional limits for tasks assessed     SENSATION: WFL   LUMBAR ROM: WNL for tasks assessed   LOWER EXTREMITY ROM:    WNL for tasks assessed   LOWER EXTREMITY MMT:  4 to 5/5 B tested in sitting    FUNCTIONAL TESTS:  5 times sit to stand: 17.01 sec  06/23/24: 5 times sit to stand: 18.39 sec (a little worse but patient preparing for ablation) TUG 06/23/24: TUG: 11.71 sec 6 minute walk test: 923 ft RPE 5/10 vitals PR 94 bpm, O2 sats 90%   TUG 07/02/24: TUG: 14.19 sec 07/02/24 5 times sit to stand: 30.33 sec   Functional gait assessment:  FUNCTIONAL GAIT ASSESSMENT  Date:  04/29/24 Score  GAIT LEVEL SURFACE Instructions: Walk at your normal speed from here to the next mark (6 m) [20 ft]. (2) Mild impairment - Walks 6 m (20 ft) in less than 7 seconds but greater than 5.5 seconds, uses assistive device, slower speed, mild gait deviations, or deviates 15.24 -25.4 cm (6 -10 in) outside of the 30.48-cm (12-in) walkway width.  2.   CHANGE IN GAIT SPEED Instructions: Begin walking at your normal pace (for 1.5 m [5 ft]). When I tell you go, walk as fast as you can (for 1.5 m [5 ft]). When I tell you slow, walk as slowly as you can (for 1.5 m [5 ft]. (2) Mild impairment - Is able to change speed but demonstrates mild gait deviations, deviates 15.24 -25.4 cm (6 -10 in) outside of the 30.48-cm (12-in) walkway width, or no gait deviations but unable to achieve a significant change in velocity, or uses an assistive device  3.    GAIT WITH HORIZONTAL HEAD TURNS Instructions: Walk from here to the next mark 6 m (20 ft) away. Begin walking at your normal pace. Keep walking straight; after 3 steps, turn your head to the right and keep walking straight while looking to the right. After 3 more steps, turn your head to the left and keep walking straight while looking left. Continue alternating looking right and left. (2) Mild impairment - Performs head turns smoothly with slight change in gait velocity (eg, minor disruption to smooth gait path), deviates 15.24 -25.4 cm (6 -10 in) outside 30.48-cm (  12-in) walkway width, or uses an assistive device.  4.   GAIT WITH VERTICAL HEAD TURNS Instructions: Walk from here to the next mark (6 m [20 ft]). Begin walking at your normal pace. Keep walking straight; after 3 steps, tip your head up and keep walking straight while looking up. After 3 more steps, tip your head down, keep walking straight while looking down. Continue  alternating looking up and down every 3 steps until you have completed 2 repetitions in each direction. (2) Mild impairment -  Performs task with slight change in gait velocity (eg, minor disruption to smooth gait path), deviates 15.24 -25.4 cm (6 -10 in) outside 30.48-cm (12-in) walkway width or uses assistive device.  5.  GAIT AND PIVOT TURN Instructions: Begin with walking at your normal pace. When I tell you, turn and stop, turn as quickly as you can to face the opposite direction and stop. (2) Mild impairment - Pivot turns safely in 3 seconds and stops with no loss of balance, or pivot turns safely within 3 seconds and stops with mild imbalance, requires small steps to catch balance  6.   STEP OVER OBSTACLE Instructions: Begin walking at your normal speed. When you come to the shoe box, step over it, not around it, and keep walking. (2) Mild impairment - Is able to step over one shoe box (11.43 cm [4.5 in] total height) without changing gait speed; no evidence of imbalance.  7.   GAIT WITH NARROW BASE OF SUPPORT Instructions: Walk on the floor with arms folded across the chest, feet aligned heel to toe in tandem for a distance of 3.6 m [12 ft]. The number of steps taken in a straight line are counted for a maximum of 10 steps. (2) Mild impairment - Ambulates 7-9 steps  8.   GAIT WITH EYES CLOSED Instructions: Walk at your normal speed from here to the next mark (6 m [20 ft]) with your eyes closed. (1) Moderate impairment - Walks 6 m (20 ft), slow speed, abnormal gait pattern, evidence for imbalance, deviates 25.4 -38.1 cm (10 -15 in) outside 30.48-cm (12-in) walkway width. Requires more than 9 seconds to ambulate 6 m (20 ft).  9.   AMBULATING BACKWARDS Instructions: Walk backwards until I tell you to stop (2) Mild impairment - Walks 6 m (20 ft), uses assistive device, slower speed, mild gait deviations, deviates 15.24 -25.4 cm (6 -10 in) outside 30.48-cm (12-in) walkway width  10. STEPS Instructions: Walk up these stairs as you would at home (ie, using the rail if necessary). At the top turn around and walk down. (1)  Moderate impairment-Two feet to a stair; must use rail.  Total 18/30   Interpretation of scores: Non-Specific Older Adults Cutoff Score: <=22/30 = risk of falls Parkinsons Disease Cutoff score <15/30= fall risk (Hoehn & Yahr 1-4)  Minimally Clinically Important Difference (MCID)  Stroke (acute, subacute, and chronic) = MDC: 4.2 points Vestibular (acute) = MDC: 6 points Community Dwelling Older Adults =  MCID: 4 points Parkinsons Disease  =  MDC: 4.3 points  ([employer]y [employer] (nd). Functional Gait Assessment. Retrieved from https://www.neuropt.org/docs/default-source/cpgs/core-outcome-measures/function-gait-assessment-pocket-guide-proof9-(2).pdf?sfvrsn=b55f35043_0.)  Interpretation of scores: Non-Specific Older Adults Cutoff Score: <=22/30 = risk of falls Parkinsons Disease Cutoff score <15/30= fall risk (Hoehn & Yahr 1-4)  Minimally Clinically Important Difference (MCID)  Stroke (acute, subacute, and chronic) = MDC: 4.2 points Vestibular (acute) = MDC: 6 points Community Dwelling Older Adults =  MCID: 4 points Parkinsons Disease  =  MDC:  4.3 points  (Academy of Neurologic Physical Therapy (nd). Functional Gait Assessment. Retrieved from https://www.neuropt.org/docs/default-source/cpgs/core-outcome-measures/function-gait-assessment-pocket-guide-proof9-(2).pdf?sfvrsn=b39f35043_0.)  GAIT: Safe gait patten with walking stick  TREATMENT DATE:                                                                                                                               07/30/24 Nustep level 5 x 5 min Seated piriformis stretch 3 x 30 sec each LE Sit to stand  2 x 5 with 5 lb kb Squat to table 2 x 5 with 5 lb kb Seated LAQ with 2 lb aw 2 x 10 each LE Seated march x 20 with 2 lb aw Steam engines x 20 with 2# Seated bicep curls 2 x 10 with 2# Seated tricep press with red tband 2 x 10 each UE Seated UE punches x 20 with 2# Seated hamstring curl with red tband 2 x  10 each LE Seated clam with yellow loop x 20 Lateral band walks with blue loop x 5 laps at back counter Seated mini sit up 2 x 10 with 5# kb Seated modified Russian twist with 5# kb x 20 Seated hip to shoulder 2 x 10 each side with 5# kb  07/21/24 Nustep level 5 x 5 min Seated piriformis stretch 3 x 30 sec each LE Sit to stand x 5 Squat to table x 5 Steam engines x 20 with 2# Seated bicep curls 2 x 10 with 2# Seated tricep press with red tband 2 x 10 each UE Seated UE punches x 20 with 2# Seated hamstring curl with red tband 2 x 10 each LE At barre: 6 inch step up x 10 each LE fwd then lateral Lateral band walks with blue loop x 5 laps Seated clam with blue loop x 20 Seated mini sit up 2 x 10 with 5# kb Seated modified Russian twist with 5# kb x 20 Seated hip to shoulder 2 x 10 each side with 5# kb  07/02/24  10th visit re-assessment completed Seated biceps curl 2# 2x 10 Seated steam engines 3# 2 x 10 bilateral  5 TSTS and TUG completed Seated tricep press with red tband 2 x 10 each UE Patient was fatigued: we held on further treatment today   PATIENT EDUCATION:  Education details: PT eval findings, anticipated POC, and HEP review   Person educated: Patient Education method: verbally discussed Education comprehension: verbalized understanding  HOME EXERCISE PROGRAM: Access Code: [email or web address] URL: [email or web address] Date: 04/15/2024 Prepared by: Mliss (previous HEP from recent episode)  Exercises - Sit to Stand with Arms Crossed  - 1 x daily - 5 x weekly - 2 sets - 10 reps - Standing Hamstring Curl with Chair Support  - 1 x daily - 7 x weekly - 3 sets - 10-15 reps - Standing March with Counter Support  - 1 x daily - 7 x weekly - 2-3 sets - 1 min hold - Side Stepping with Counter Support  -  1 x daily - 5 x weekly - 2 sets - 1 min hold - Forward Step Up  - 1 x daily - 5 x weekly - 2-3 sets - 30 sec hold - Walking with Eyes Closed and Counter Support  - 1  x daily - 5 x weekly - 2 sets - 5 reps - Standing Near Stance in Corner with Eyes Closed  - 1 x daily - 5 x weekly - 3 sets - 30 sec hold  ASSESSMENT:  CLINICAL IMPRESSION: Jammy is doing very well.  She is tolerating increased resistance and reps on several activities today.  She reports ability to do more at home without feeling overly fatigued.  She still has someone take her groceries to her car at the grocery store but she brings them in at home herself but makes several trips to/from the car.   She would benefit from continued skilled PT to address the below impairments and improve overall function.   Eval: Patient is a 78 y.o. female who was seen today for physical therapy evaluation and treatment for osteoporosis and fall prevention . She was recently discharged from neuro PT due to complications from Afib. She has recently returned to work and has deficits with endurance with standing and walking  affecting work and grocery shopping. She also reports deficits with carrying over 10#. She denies pain. She continues to report balance deficits, but states her balance is much improved and it depends on the day. She will benefit from skilled PT to address these deficits and those listed below. Due to her RTW she will only be available 1x/wk.  OBJECTIVE IMPAIRMENTS: cardiopulmonary status limiting activity, decreased activity tolerance, decreased balance, difficulty walking, and decreased strength.   ACTIVITY LIMITATIONS: carrying, lifting, standing, and locomotion level  PARTICIPATION LIMITATIONS: laundry, shopping, and occupation  PERSONAL FACTORS: Age, Fitness, Past/current experiences, Time since onset of injury/illness/exacerbation, and 3+ comorbidities: OP, Afib, vertigo are also affecting patient's functional outcome.   REHAB POTENTIAL: Good  CLINICAL DECISION MAKING: Evolving/moderate complexity  EVALUATION COMPLEXITY: Moderate   GOALS: Goals reviewed with patient? Yes  SHORT  TERM GOALS: Target date: 05/27/24  Ind with initial HEP Baseline: Goal status: MET 06/11/24  2.  Decreased TUG by 2-3 seconds showing decreased risk for falls. Baseline: 13.77 sec Goal status: In progress  3.  Decreased 5XSTS by 2-3 seconds showing functional improvement in strength Baseline:  Goal status: In progress   LONG TERM GOALS: Target date: 07/08/24  Ind with advanced HEP Baseline:  Goal status: MET 07/30/24  2.  Improved average PSFS by 2 points showing functional improvement Baseline: 2.75 Goal status: MET 06/11/24  3.  Patient to report improved endurance with grocery shopping by 50%  Baseline:  Goal status: MET 07/30/24  4.  Improved to 1,100 ft or more with device as needed Baseline: 923 ft Goal status: In Progress  5.  FGA to improve at least 4 points demonstrating decreased risk for falls Baseline: TBD Goal status: in progress  6.  Able to carry >= 10# x 25 ft showing improved functional UE strength Baseline:  Goal status: In progress  7. Improved standing tolerance to > 15-20 min without AD Baseline:  Goal status: In progress  PLAN:  PT FREQUENCY: 1x/week  PT DURATION: 12 weeks  PLANNED INTERVENTIONS: 97164- PT Re-evaluation, 97110-Therapeutic exercises, 97530- Therapeutic activity, [id number]- Neuromuscular re-education, 97535- Self Care, [id number]- Manual therapy, [phone]- Gait training, Patient/Family education, Balance training, and Stair training.  PLAN FOR NEXT SESSION:  Gait endurance, resistive training, balance, functional LE and core strength for osteoporosis/balance   Quasim Doyon B. Carmalita Wakefield, PT 07/30/2024 4:18 PM [facility] [address] [place], [place] [address] Phone # [phone] Fax [phone]

## 2024-08-06 ENCOUNTER — Ambulatory Visit

## 2024-08-06 DIAGNOSIS — R252 Cramp and spasm: Secondary | ICD-10-CM

## 2024-08-06 DIAGNOSIS — M6281 Muscle weakness (generalized): Secondary | ICD-10-CM

## 2024-08-06 DIAGNOSIS — R2681 Unsteadiness on feet: Secondary | ICD-10-CM

## 2024-08-06 DIAGNOSIS — M5459 Other low back pain: Secondary | ICD-10-CM

## 2024-08-06 DIAGNOSIS — R262 Difficulty in walking, not elsewhere classified: Secondary | ICD-10-CM | POA: Diagnosis not present

## 2024-08-06 DIAGNOSIS — R293 Abnormal posture: Secondary | ICD-10-CM

## 2024-08-06 NOTE — Therapy (Signed)
 OUTPATIENT PHYSICAL THERAPY THORACOLUMBAR TREATMENT      Patient Name: Michelle Wilcox MRN: [id number] DOB:1946-02-24, 78 y.o., female Today's Date: 08/06/2024  END OF SESSION:  PT End of Session - 08/06/24 1241     Visit Number 13    Date for Recertification  09/03/24    Authorization Type BCBS Medicare    Progress Note Due on Visit 20    PT Start Time 1230    PT Stop Time 1313    PT Time Calculation (min) 43 min    Activity Tolerance Patient tolerated treatment well    Behavior During Therapy WFL for tasks assessed/performed              Past Medical History:  Diagnosis Date   Anxiety    hx of   Asthma    at times   CHF (congestive heart failure) (HCC)    Depression    hx of   Dyspnea    Giant cell arteritis (HCC)    Gouty arthropathy    Hemolytic anemia    Hyperlipidemia    Hypertension    Irregular heart beat    extra beat   Migraines    Polymyalgia    Polymyalgia rheumatica    Temporal arteritis (HCC)    Past Surgical History:  Procedure Laterality Date   ATRIAL FIBRILLATION ABLATION N/A 06/26/2024   Procedure: ATRIAL FIBRILLATION ABLATION;  Surgeon: Kennyth Chew, MD;  Location: [facility];  Service: Cardiovascular;  Laterality: N/A;   BARTHOLIN GLAND CYST EXCISION     non ca   BREAST BIOPSY Right    CARDIOVERSION N/A 11/05/2023   Procedure: CARDIOVERSION;  Surgeon: Sheena Pugh, DO;  Location: [facility];  Service: Cardiovascular;  Laterality: N/A;   CARDIOVERSION N/A 12/19/2023   Procedure: CARDIOVERSION;  Surgeon: Santo Stanly LABOR, MD;  Location: [facility];  Service: Cardiovascular;  Laterality: N/A;   EYE MUSCLE SURGERY     as a child 38 yrs old   KNEE SURGERY  2004   rt knee   LAPAROSCOPIC SPLENECTOMY N/A 06/18/2018   Procedure: LAPAROSCOPIC SPLENECTOMY ERAS PATHWAY;  Surgeon: Mikell Katz, MD;  Location: [facility];  Service: General;  Laterality: N/A;   RIGHT/LEFT HEART CATH AND CORONARY  ANGIOGRAPHY N/A 03/04/2024   Procedure: RIGHT/LEFT HEART CATH AND CORONARY ANGIOGRAPHY;  Surgeon: Anner Alm ORN, MD;  Location: [facility];  Service: Cardiovascular;  Laterality: N/A;   toncil     TONSILLECTOMY     as a child   TRANSESOPHAGEAL ECHOCARDIOGRAM (CATH LAB) N/A 11/05/2023   Procedure: TRANSESOPHAGEAL ECHOCARDIOGRAM;  Surgeon: Sheena Pugh, DO;  Location: [facility];  Service: Cardiovascular;  Laterality: N/A;   TRANSESOPHAGEAL ECHOCARDIOGRAM (CATH LAB) N/A 12/19/2023   Procedure: TRANSESOPHAGEAL ECHOCARDIOGRAM;  Surgeon: Santo Stanly LABOR, MD;  Location: [facility];  Service: Cardiovascular;  Laterality: N/A;   Patient Active Problem List   Diagnosis Date Noted   Hypercoagulable state due to paroxysmal atrial fibrillation (HCC) 07/07/2024   Nonischemic cardiomyopathy (HCC) 12/24/2023   Pericardial effusion 11/07/2023   Borderline abnormal TFTs 11/05/2023   Macrocytosis 11/05/2023   Paroxysmal atrial fibrillation (HCC) 11/04/2023   PMR (polymyalgia rheumatica) 11/04/2023   Primary localized osteoarthrosis of multiple sites 06/15/2022   Diastolic dysfunction without heart failure 03/05/2018   DOE (dyspnea on exertion) 03/05/2018   Preop cardiovascular exam 03/05/2018   Mild reactive airways disease 08/04/2017   Hypokalemia 08/03/2017   Essential hypertension 08/03/2017   Hyperlipidemia  08/03/2017   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   Hypersensitivity reaction 11/20/2016   Drusen (degenerative) of retina, bilateral 05/22/2016   Nuclear cataract 05/22/2016   Retinal hemorrhage of left eye 05/22/2016   Temporal arteritis (HCC) 05/22/2016   Hemolytic anemia 03/19/2016   Breast mass, right 07/01/2012    PCP: Aisha Harvey, MD'  REFERRING PROVIDER: Aisha Harvey, MD   REFERRING DIAG: M81.0 (ICD-10-CM) - Age-related osteoporosis without current pathological fracture   Rationale for Evaluation and Treatment:  Rehabilitation  THERAPY DIAG:  Difficulty in walking, not elsewhere classified  Unsteadiness on feet  Abnormal posture  Cramp and spasm  Other low back pain  Muscle weakness (generalized)  ONSET DATE: 11/04/23  SUBJECTIVE:                                                                                                                                                                                           SUBJECTIVE STATEMENT: Patient reports she has been doing well.  I've noticed that my legs don't get as tired.  I can keep up with my co-workers when we are walking in/ou of work and I can put my pants on standing up without losing my balance     Eval: Here for OP ,She is still in Afib all the time (see history below). They want her to have an ablation now, before the end of the year. Seeing cardiologist tomorrow. She has been back to work since Aug 2 at [employer]. 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 Working 32 hours. Would like 1x/wk. She is challenged with back to back tours as she is talking. She still feels unsteady at times. It depends on the day. Standing is limited to 10 minutes due to fatigue.   PERTINENT HISTORY:   Vertigo, 12/31/2023:  Pt underwent attempt at cardioversion for a-fib on 12/19/23, which did not last >24 hrs; was hospitalized after ED visit for SOB, CHF.  Anxiety, asthma, CHF, depression, gouty arthropathy, anemia, HLD, HTN, migraines, polymyalgia rheumatica, temporal arteritis, R knee surgery   PAIN:  Are you having pain? No  PRECAUTIONS: None  RED FLAGS: None   WEIGHT BEARING RESTRICTIONS: No  FALLS:  Has patient fallen in last 6 months? No  LIVING ENVIRONMENT: Lives with: lives with their spouse who have stage 4 stomach CA (pt reports that she is having to physically assist him some) Lives in: Other townhouse  Stairs: 2 stories, sleeps on 1st floor in recliner  Has following equipment at home: Vannie - 2 wheeled,  cane  OCCUPATION: works 32 hours per week  PLOF: Independent with basic ADLs, Independent  with household mobility without device, Independent with community mobility with device, and Vocation/Vocational requirements: walking and talking  PATIENT GOALS: Help to do with osteoporosis and balance still  NEXT MD VISIT: unsure  OBJECTIVE:  Note: Objective measures were completed at Evaluation unless otherwise noted.  DIAGNOSTIC FINDINGS:  None recent  PATIENT SURVEYS:  PSFS: THE PATIENT SPECIFIC FUNCTIONAL SCALE  Place score of 0-10 (0 = unable to perform activity and 10 = able to perform activity at the same level as before injury or problem)  Activity Date: 04/15/24 Date: 06/11/24 Date: 07/02/24 Patient just had ablation and is quite limited from a cardiac standpoint  Standing for conversation or tasks unassisted   2     5   7   2.endurance with walking   4     6   6   3. Managing grocery store runs (including unloading and putting things away)   3     5    2   4. Picking up 10# or more and carry   2     5 0  Total Score 2.75 5.25     Total Score = Sum of activity scores/number of activities  Minimally Detectable Change: 3 points (for single activity); 2 points (for average score)  [facility] (nd). The Patient Specific Functional Scale . Retrieved from [email or web address]   COGNITION: Overall cognitive status: Within functional limits for tasks assessed     SENSATION: WFL   LUMBAR ROM: WNL for tasks assessed   LOWER EXTREMITY ROM:    WNL for tasks assessed   LOWER EXTREMITY MMT:  4 to 5/5 B tested in sitting    FUNCTIONAL TESTS:  5 times sit to stand: 17.01 sec  06/23/24: 5 times sit to stand: 18.39 sec (a little worse but patient preparing for ablation) TUG 06/23/24: TUG: 11.71 sec 6 minute walk test: 923 ft RPE 5/10 vitals PR 94 bpm, O2 sats 90%   TUG 07/02/24: TUG: 14.19 sec 07/02/24 5 times  sit to stand: 30.33 sec   Functional gait assessment:  FUNCTIONAL GAIT ASSESSMENT  Date: 04/29/24 Score  GAIT LEVEL SURFACE Instructions: Walk at your normal speed from here to the next mark (6 m) [20 ft]. (2) Mild impairment - Walks 6 m (20 ft) in less than 7 seconds but greater than 5.5 seconds, uses assistive device, slower speed, mild gait deviations, or deviates 15.24 -25.4 cm (6 -10 in) outside of the 30.48-cm (12-in) walkway width.  2.   CHANGE IN GAIT SPEED Instructions: Begin walking at your normal pace (for 1.5 m [5 ft]). When I tell you go, walk as fast as you can (for 1.5 m [5 ft]). When I tell you slow, walk as slowly as you can (for 1.5 m [5 ft]. (2) Mild impairment - Is able to change speed but demonstrates mild gait deviations, deviates 15.24 -25.4 cm (6 -10 in) outside of the 30.48-cm (12-in) walkway width, or no gait deviations but unable to achieve a significant change in velocity, or uses an assistive device  3.    GAIT WITH HORIZONTAL HEAD TURNS Instructions: Walk from here to the next mark 6 m (20 ft) away. Begin walking at your normal pace. Keep walking straight; after 3 steps, turn your head to the right and keep walking straight while looking to the right. After 3 more steps, turn your head to the left and keep walking straight while looking left. Continue alternating looking right and left. (2) Mild impairment - Performs head  turns smoothly with slight change in gait velocity (eg, minor disruption to smooth gait path), deviates 15.24 -25.4 cm (6 -10 in) outside 30.48-cm (12-in) walkway width, or uses an assistive device.  4.   GAIT WITH VERTICAL HEAD TURNS Instructions: Walk from here to the next mark (6 m [20 ft]). Begin walking at your normal pace. Keep walking straight; after 3 steps, tip your head up and keep walking straight while looking up. After 3 more steps, tip your head down, keep walking straight while looking down. Continue  alternating looking up and down every  3 steps until you have completed 2 repetitions in each direction. (2) Mild impairment - Performs task with slight change in gait velocity (eg, minor disruption to smooth gait path), deviates 15.24 -25.4 cm (6 -10 in) outside 30.48-cm (12-in) walkway width or uses assistive device.  5.  GAIT AND PIVOT TURN Instructions: Begin with walking at your normal pace. When I tell you, turn and stop, turn as quickly as you can to face the opposite direction and stop. (2) Mild impairment - Pivot turns safely in 3 seconds and stops with no loss of balance, or pivot turns safely within 3 seconds and stops with mild imbalance, requires small steps to catch balance  6.   STEP OVER OBSTACLE Instructions: Begin walking at your normal speed. When you come to the shoe box, step over it, not around it, and keep walking. (2) Mild impairment - Is able to step over one shoe box (11.43 cm [4.5 in] total height) without changing gait speed; no evidence of imbalance.  7.   GAIT WITH NARROW BASE OF SUPPORT Instructions: Walk on the floor with arms folded across the chest, feet aligned heel to toe in tandem for a distance of 3.6 m [12 ft]. The number of steps taken in a straight line are counted for a maximum of 10 steps. (2) Mild impairment - Ambulates 7-9 steps  8.   GAIT WITH EYES CLOSED Instructions: Walk at your normal speed from here to the next mark (6 m [20 ft]) with your eyes closed. (1) Moderate impairment - Walks 6 m (20 ft), slow speed, abnormal gait pattern, evidence for imbalance, deviates 25.4 -38.1 cm (10 -15 in) outside 30.48-cm (12-in) walkway width. Requires more than 9 seconds to ambulate 6 m (20 ft).  9.   AMBULATING BACKWARDS Instructions: Walk backwards until I tell you to stop (2) Mild impairment - Walks 6 m (20 ft), uses assistive device, slower speed, mild gait deviations, deviates 15.24 -25.4 cm (6 -10 in) outside 30.48-cm (12-in) walkway width  10. STEPS Instructions: Walk up these stairs as you would  at home (ie, using the rail if necessary). At the top turn around and walk down. (1) Moderate impairment-Two feet to a stair; must use rail.  Total 18/30   Interpretation of scores: Non-Specific Older Adults Cutoff Score: <=22/30 = risk of falls Parkinsons Disease Cutoff score <15/30= fall risk (Hoehn & Yahr 1-4)  Minimally Clinically Important Difference (MCID)  Stroke (acute, subacute, and chronic) = MDC: 4.2 points Vestibular (acute) = MDC: 6 points Community Dwelling Older Adults =  MCID: 4 points Parkinsons Disease  =  MDC: 4.3 points  ([employer]y [employer] (nd). Functional Gait Assessment. Retrieved from https://www.neuropt.org/docs/default-source/cpgs/core-outcome-measures/function-gait-assessment-pocket-guide-proof9-(2).pdf?sfvrsn=b29f35043_0.)  Interpretation of scores: Non-Specific Older Adults Cutoff Score: <=22/30 = risk of falls Parkinsons Disease Cutoff score <15/30= fall risk (Hoehn & Yahr 1-4)  Minimally Clinically Important Difference (MCID)  Stroke (acute, subacute, and chronic) =  MDC: 4.2 points Vestibular (acute) = MDC: 6 points Community Dwelling Older Adults =  MCID: 4 points Parkinsons Disease  =  MDC: 4.3 points  (Academy of Neurologic Physical Therapy (nd). Functional Gait Assessment. Retrieved from https://www.neuropt.org/docs/default-source/cpgs/core-outcome-measures/function-gait-assessment-pocket-guide-proof9-(2).pdf?sfvrsn=b53f35043_0.)  GAIT: Safe gait patten with walking stick  TREATMENT DATE:                                                                                                                               08/06/24 Nustep level 5 x 5 min Standing on balance pad: marching x 20 with single UE support Standing on balance pad: mini squats 2 x 10 with single UE support Step up and hold on balance pad with single UE support  Seated clam with yellow loop x 20 Seated LAQ with 2 lb aw 2 x 10 each LE Seated march x 20 with 2  lb aw Seated hip ER 2 x 10 with 2 lb aw Steam engines x 20 with 3# Seated bicep curls 2 x 10 with 3# Seated tricep press with red tband 2 x 10 each UE Seated UE punches x 20 with 2# Seated piriformis stretch 3 x 30 sec each LE Sit to stand  2 x 5 with 5 lb kb Squat to table 2 x 5 with 5 lb kb Seated mini sit up 2 x 10 with 5# kb Seated modified Russian twist with 5# kb x 20 Seated hip to shoulder 2 x 10 each side with 5# kb  07/30/24 Nustep level 5 x 5 min Seated piriformis stretch 3 x 30 sec each LE Sit to stand  2 x 5 with 5 lb kb Squat to table 2 x 5 with 5 lb kb Seated LAQ with 2 lb aw 2 x 10 each LE Seated march x 20 with 2 lb aw Steam engines x 20 with 2# Seated bicep curls 2 x 10 with 2# Seated tricep press with red tband 2 x 10 each UE Seated UE punches x 20 with 2# Seated hamstring curl with red tband 2 x 10 each LE Seated clam with yellow loop x 20 Lateral band walks with blue loop x 5 laps at back counter Seated mini sit up 2 x 10 with 5# kb Seated modified Russian twist with 5# kb x 20 Seated hip to shoulder 2 x 10 each side with 5# kb  07/21/24 Nustep level 5 x 5 min Seated piriformis stretch 3 x 30 sec each LE Sit to stand x 5 Squat to table x 5 Steam engines x 20 with 2# Seated bicep curls 2 x 10 with 2# Seated tricep press with red tband 2 x 10 each UE Seated UE punches x 20 with 2# Seated hamstring curl with red tband 2 x 10 each LE At barre: 6 inch step up x 10 each LE fwd then lateral Lateral band walks with blue loop x 5 laps Seated clam with blue loop x 20 Seated mini  sit up 2 x 10 with 5# kb Seated modified Russian twist with 5# kb x 20 Seated hip to shoulder 2 x 10 each side with 5# kb  07/02/24  10th visit re-assessment completed Seated biceps curl 2# 2x 10 Seated steam engines 3# 2 x 10 bilateral  5 TSTS and TUG completed Seated tricep press with red tband 2 x 10 each UE Patient was fatigued: we held on further treatment  today   PATIENT EDUCATION:  Education details: PT eval findings, anticipated POC, and HEP review   Person educated: Patient Education method: verbally discussed Education comprehension: verbalized understanding  HOME EXERCISE PROGRAM: Access Code: [email or web address] URL: [email or web address] Date: 04/15/2024 Prepared by: Mliss (previous HEP from recent episode)  Exercises - Sit to Stand with Arms Crossed  - 1 x daily - 5 x weekly - 2 sets - 10 reps - Standing Hamstring Curl with Chair Support  - 1 x daily - 7 x weekly - 3 sets - 10-15 reps - Standing March with Counter Support  - 1 x daily - 7 x weekly - 2-3 sets - 1 min hold - Side Stepping with Counter Support  - 1 x daily - 5 x weekly - 2 sets - 1 min hold - Forward Step Up  - 1 x daily - 5 x weekly - 2-3 sets - 30 sec hold - Walking with Eyes Closed and Counter Support  - 1 x daily - 5 x weekly - 2 sets - 5 reps - Standing Near Stance in Corner with Eyes Closed  - 1 x daily - 5 x weekly - 3 sets - 30 sec hold  ASSESSMENT:  CLINICAL IMPRESSION: Michelle Wilcox is progressing appropriately.  We did more balance tasks today.  She did get slightly more fatigued with the dynamic balance tasks but completed all tasks.  She l has someone take her groceries to her car at the grocery store but she brings them in at home herself but makes several trips to/from the car.   She would benefit from continued skilled PT to address the below impairments and improve overall function.   Eval: Patient is a 78 y.o. female who was seen today for physical therapy evaluation and treatment for osteoporosis and fall prevention . She was recently discharged from neuro PT due to complications from Afib. She has recently returned to work and has deficits with endurance with standing and walking  affecting work and grocery shopping. She also reports deficits with carrying over 10#. She denies pain. She continues to report balance deficits, but states her balance is  much improved and it depends on the day. She will benefit from skilled PT to address these deficits and those listed below. Due to her RTW she will only be available 1x/wk.  OBJECTIVE IMPAIRMENTS: cardiopulmonary status limiting activity, decreased activity tolerance, decreased balance, difficulty walking, and decreased strength.   ACTIVITY LIMITATIONS: carrying, lifting, standing, and locomotion level  PARTICIPATION LIMITATIONS: laundry, shopping, and occupation  PERSONAL FACTORS: Age, Fitness, Past/current experiences, Time since onset of injury/illness/exacerbation, and 3+ comorbidities: OP, Afib, vertigo are also affecting patient's functional outcome.   REHAB POTENTIAL: Good  CLINICAL DECISION MAKING: Evolving/moderate complexity  EVALUATION COMPLEXITY: Moderate   GOALS: Goals reviewed with patient? Yes  SHORT TERM GOALS: Target date: 05/27/24  Ind with initial HEP Baseline: Goal status: MET 06/11/24  2.  Decreased TUG by 2-3 seconds showing decreased risk for falls. Baseline: 13.77 sec Goal status: In progress  3.  Decreased 5XSTS by 2-3  seconds showing functional improvement in strength Baseline:  Goal status: In progress   LONG TERM GOALS: Target date: 07/08/24  Ind with advanced HEP Baseline:  Goal status: MET 07/30/24  2.  Improved average PSFS by 2 points showing functional improvement Baseline: 2.75 Goal status: MET 06/11/24  3.  Patient to report improved endurance with grocery shopping by 50%  Baseline:  Goal status: MET 07/30/24  4.  Improved to 1,100 ft or more with device as needed Baseline: 923 ft Goal status: In Progress  5.  FGA to improve at least 4 points demonstrating decreased risk for falls Baseline: TBD Goal status: in progress  6.  Able to carry >= 10# x 25 ft showing improved functional UE strength Baseline:  Goal status: In progress  7. Improved standing tolerance to > 15-20 min without AD Baseline:  Goal status: In  progress  PLAN:  PT FREQUENCY: 1x/week  PT DURATION: 12 weeks  PLANNED INTERVENTIONS: 97164- PT Re-evaluation, 97110-Therapeutic exercises, 97530- Therapeutic activity, [id number]- Neuromuscular re-education, 97535- Self Care, [id number]- Manual therapy, [phone]- Gait training, Patient/Family education, Balance training, and Stair training.  PLAN FOR NEXT SESSION:  Gait endurance, resistive training, balance, functional LE and core strength for osteoporosis/balance   Jamespaul Secrist B. Glennette Galster, PT 08/06/2024 4:58 PM [facility] [address] [place], [place] [address] Phone # [phone] Fax [phone]

## 2024-08-11 ENCOUNTER — Ambulatory Visit

## 2024-08-11 DIAGNOSIS — R2681 Unsteadiness on feet: Secondary | ICD-10-CM

## 2024-08-11 DIAGNOSIS — M5459 Other low back pain: Secondary | ICD-10-CM

## 2024-08-11 DIAGNOSIS — R262 Difficulty in walking, not elsewhere classified: Secondary | ICD-10-CM | POA: Diagnosis not present

## 2024-08-11 DIAGNOSIS — M6281 Muscle weakness (generalized): Secondary | ICD-10-CM

## 2024-08-11 DIAGNOSIS — R252 Cramp and spasm: Secondary | ICD-10-CM

## 2024-08-11 DIAGNOSIS — R293 Abnormal posture: Secondary | ICD-10-CM

## 2024-08-11 NOTE — Therapy (Signed)
 " OUTPATIENT PHYSICAL THERAPY THORACOLUMBAR TREATMENT      Patient Name: Michelle Wilcox MRN: [id number] DOB:1946/03/12, 78 y.o., female Today's Date: 08/11/2024  END OF SESSION:  PT End of Session - 08/11/24 0935     Visit Number 14    Date for Recertification  09/03/24    Authorization Type BCBS Medicare    Progress Note Due on Visit 20    PT Start Time 0932    PT Stop Time 1015    PT Time Calculation (min) 43 min    Activity Tolerance Patient tolerated treatment well    Behavior During Therapy WFL for tasks assessed/performed              Past Medical History:  Diagnosis Date   Anxiety    hx of   Asthma    at times   CHF (congestive heart failure) (HCC)    Depression    hx of   Dyspnea    Giant cell arteritis (HCC)    Gouty arthropathy    Hemolytic anemia    Hyperlipidemia    Hypertension    Irregular heart beat    extra beat   Migraines    Polymyalgia    Polymyalgia rheumatica    Temporal arteritis (HCC)    Past Surgical History:  Procedure Laterality Date   ATRIAL FIBRILLATION ABLATION N/A 06/26/2024   Procedure: ATRIAL FIBRILLATION ABLATION;  Surgeon: Kennyth Chew, MD;  Location: [facility];  Service: Cardiovascular;  Laterality: N/A;   BARTHOLIN GLAND CYST EXCISION     non ca   BREAST BIOPSY Right    CARDIOVERSION N/A 11/05/2023   Procedure: CARDIOVERSION;  Surgeon: Sheena Pugh, DO;  Location: [facility];  Service: Cardiovascular;  Laterality: N/A;   CARDIOVERSION N/A 12/19/2023   Procedure: CARDIOVERSION;  Surgeon: Santo Stanly LABOR, MD;  Location: [facility];  Service: Cardiovascular;  Laterality: N/A;   EYE MUSCLE SURGERY     as a child 83 yrs old   KNEE SURGERY  2004   rt knee   LAPAROSCOPIC SPLENECTOMY N/A 06/18/2018   Procedure: LAPAROSCOPIC SPLENECTOMY ERAS PATHWAY;  Surgeon: Mikell Katz, MD;  Location: [facility];  Service: General;  Laterality: N/A;   RIGHT/LEFT HEART CATH AND CORONARY  ANGIOGRAPHY N/A 03/04/2024   Procedure: RIGHT/LEFT HEART CATH AND CORONARY ANGIOGRAPHY;  Surgeon: Anner Alm ORN, MD;  Location: [facility];  Service: Cardiovascular;  Laterality: N/A;   toncil     TONSILLECTOMY     as a child   TRANSESOPHAGEAL ECHOCARDIOGRAM (CATH LAB) N/A 11/05/2023   Procedure: TRANSESOPHAGEAL ECHOCARDIOGRAM;  Surgeon: Sheena Pugh, DO;  Location: [facility];  Service: Cardiovascular;  Laterality: N/A;   TRANSESOPHAGEAL ECHOCARDIOGRAM (CATH LAB) N/A 12/19/2023   Procedure: TRANSESOPHAGEAL ECHOCARDIOGRAM;  Surgeon: Santo Stanly LABOR, MD;  Location: [facility];  Service: Cardiovascular;  Laterality: N/A;   Patient Active Problem List   Diagnosis Date Noted   Hypercoagulable state due to paroxysmal atrial fibrillation (HCC) 07/07/2024   Nonischemic cardiomyopathy (HCC) 12/24/2023   Pericardial effusion 11/07/2023   Borderline abnormal TFTs 11/05/2023   Macrocytosis 11/05/2023   Paroxysmal atrial fibrillation (HCC) 11/04/2023   PMR (polymyalgia rheumatica) 11/04/2023   Primary localized osteoarthrosis of multiple sites 06/15/2022   Diastolic dysfunction without heart failure 03/05/2018   DOE (dyspnea on exertion) 03/05/2018   Preop cardiovascular exam 03/05/2018   Mild reactive airways disease 08/04/2017   Hypokalemia 08/03/2017   Essential hypertension 08/03/2017  Hyperlipidemia 08/03/2017   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   Hypersensitivity reaction 11/20/2016   Drusen (degenerative) of retina, bilateral 05/22/2016   Nuclear cataract 05/22/2016   Retinal hemorrhage of left eye 05/22/2016   Temporal arteritis (HCC) 05/22/2016   Hemolytic anemia 03/19/2016   Breast mass, right 07/01/2012    PCP: Aisha Harvey, MD'  REFERRING PROVIDER: Aisha Harvey, MD   REFERRING DIAG: M81.0 (ICD-10-CM) - Age-related osteoporosis without current pathological fracture   Rationale for Evaluation and Treatment:  Rehabilitation  THERAPY DIAG:  Difficulty in walking, not elsewhere classified  Unsteadiness on feet  Muscle weakness (generalized)  Cramp and spasm  Other low back pain  Abnormal posture  ONSET DATE: 11/04/23  SUBJECTIVE:                                                                                                                                                                                           SUBJECTIVE STATEMENT: Patient reports just tired today.  We've been really busy at work  But overall doing well.     Eval: Here for OP ,She is still in Afib all the time (see history below). They want her to have an ablation now, before the end of the year. Seeing cardiologist tomorrow. She has been back to work since Aug 2 at [employer]. 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 Working 32 hours. Would like 1x/wk. She is challenged with back to back tours as she is talking. She still feels unsteady at times. It depends on the day. Standing is limited to 10 minutes due to fatigue.   PERTINENT HISTORY:   Vertigo, 12/31/2023:  Pt underwent attempt at cardioversion for a-fib on 12/19/23, which did not last >24 hrs; was hospitalized after ED visit for SOB, CHF.  Anxiety, asthma, CHF, depression, gouty arthropathy, anemia, HLD, HTN, migraines, polymyalgia rheumatica, temporal arteritis, R knee surgery   PAIN:  Are you having pain? No  PRECAUTIONS: None  RED FLAGS: None   WEIGHT BEARING RESTRICTIONS: No  FALLS:  Has patient fallen in last 6 months? No  LIVING ENVIRONMENT: Lives with: lives with their spouse who have stage 4 stomach CA (pt reports that she is having to physically assist him some) Lives in: Other townhouse  Stairs: 2 stories, sleeps on 1st floor in recliner  Has following equipment at home: Environmental Consultant - 2 wheeled, cane  OCCUPATION: works 32 hours per week  PLOF: Independent with basic ADLs, Independent with household mobility without  device, Independent with community mobility with device, and Vocation/Vocational requirements: walking and talking  PATIENT GOALS: Help to do with osteoporosis and balance  still  NEXT MD VISIT: unsure  OBJECTIVE:  Note: Objective measures were completed at Evaluation unless otherwise noted.  DIAGNOSTIC FINDINGS:  None recent  PATIENT SURVEYS:  PSFS: THE PATIENT SPECIFIC FUNCTIONAL SCALE  Place score of 0-10 (0 = unable to perform activity and 10 = able to perform activity at the same level as before injury or problem)  Activity Date: 04/15/24 Date: 06/11/24 Date: 07/02/24 Patient just had ablation and is quite limited from a cardiac standpoint  Standing for conversation or tasks unassisted   2     5   7   2.endurance with walking   4     6   6   3. Managing grocery store runs (including unloading and putting things away)   3     5    2   4. Picking up 10# or more and carry   2     5 0  Total Score 2.75 5.25     Total Score = Sum of activity scores/number of activities  Minimally Detectable Change: 3 points (for single activity); 2 points (for average score)  [facility] (nd). The Patient Specific Functional Scale . Retrieved from [email or web address]   COGNITION: Overall cognitive status: Within functional limits for tasks assessed     SENSATION: WFL   LUMBAR ROM: WNL for tasks assessed   LOWER EXTREMITY ROM:    WNL for tasks assessed   LOWER EXTREMITY MMT:  4 to 5/5 B tested in sitting    FUNCTIONAL TESTS:  5 times sit to stand: 17.01 sec  06/23/24: 5 times sit to stand: 18.39 sec (a little worse but patient preparing for ablation) TUG 06/23/24: TUG: 11.71 sec 6 minute walk test: 923 ft RPE 5/10 vitals PR 94 bpm, O2 sats 90%   TUG 07/02/24: TUG: 14.19 sec 07/02/24 5 times sit to stand: 30.33 sec   Functional gait assessment:  FUNCTIONAL GAIT ASSESSMENT  Date: 04/29/24 Score  GAIT LEVEL  SURFACE Instructions: Walk at your normal speed from here to the next mark (6 m) [20 ft]. (2) Mild impairment - Walks 6 m (20 ft) in less than 7 seconds but greater than 5.5 seconds, uses assistive device, slower speed, mild gait deviations, or deviates 15.24 -25.4 cm (6 -10 in) outside of the 30.48-cm (12-in) walkway width.  2.   CHANGE IN GAIT SPEED Instructions: Begin walking at your normal pace (for 1.5 m [5 ft]). When I tell you go, walk as fast as you can (for 1.5 m [5 ft]). When I tell you slow, walk as slowly as you can (for 1.5 m [5 ft]. (2) Mild impairment - Is able to change speed but demonstrates mild gait deviations, deviates 15.24 -25.4 cm (6 -10 in) outside of the 30.48-cm (12-in) walkway width, or no gait deviations but unable to achieve a significant change in velocity, or uses an assistive device  3.    GAIT WITH HORIZONTAL HEAD TURNS Instructions: Walk from here to the next mark 6 m (20 ft) away. Begin walking at your normal pace. Keep walking straight; after 3 steps, turn your head to the right and keep walking straight while looking to the right. After 3 more steps, turn your head to the left and keep walking straight while looking left. Continue alternating looking right and left. (2) Mild impairment - Performs head turns smoothly with slight change in gait velocity (eg, minor disruption to smooth gait path), deviates 15.24 -25.4 cm (6 -10 in) outside 30.48-cm (12-in) walkway width,  or uses an assistive device.  4.   GAIT WITH VERTICAL HEAD TURNS Instructions: Walk from here to the next mark (6 m [20 ft]). Begin walking at your normal pace. Keep walking straight; after 3 steps, tip your head up and keep walking straight while looking up. After 3 more steps, tip your head down, keep walking straight while looking down. Continue  alternating looking up and down every 3 steps until you have completed 2 repetitions in each direction. (2) Mild impairment - Performs task with slight change  in gait velocity (eg, minor disruption to smooth gait path), deviates 15.24 -25.4 cm (6 -10 in) outside 30.48-cm (12-in) walkway width or uses assistive device.  5.  GAIT AND PIVOT TURN Instructions: Begin with walking at your normal pace. When I tell you, turn and stop, turn as quickly as you can to face the opposite direction and stop. (2) Mild impairment - Pivot turns safely in 3 seconds and stops with no loss of balance, or pivot turns safely within 3 seconds and stops with mild imbalance, requires small steps to catch balance  6.   STEP OVER OBSTACLE Instructions: Begin walking at your normal speed. When you come to the shoe box, step over it, not around it, and keep walking. (2) Mild impairment - Is able to step over one shoe box (11.43 cm [4.5 in] total height) without changing gait speed; no evidence of imbalance.  7.   GAIT WITH NARROW BASE OF SUPPORT Instructions: Walk on the floor with arms folded across the chest, feet aligned heel to toe in tandem for a distance of 3.6 m [12 ft]. The number of steps taken in a straight line are counted for a maximum of 10 steps. (2) Mild impairment - Ambulates 7-9 steps  8.   GAIT WITH EYES CLOSED Instructions: Walk at your normal speed from here to the next mark (6 m [20 ft]) with your eyes closed. (1) Moderate impairment - Walks 6 m (20 ft), slow speed, abnormal gait pattern, evidence for imbalance, deviates 25.4 -38.1 cm (10 -15 in) outside 30.48-cm (12-in) walkway width. Requires more than 9 seconds to ambulate 6 m (20 ft).  9.   AMBULATING BACKWARDS Instructions: Walk backwards until I tell you to stop (2) Mild impairment - Walks 6 m (20 ft), uses assistive device, slower speed, mild gait deviations, deviates 15.24 -25.4 cm (6 -10 in) outside 30.48-cm (12-in) walkway width  10. STEPS Instructions: Walk up these stairs as you would at home (ie, using the rail if necessary). At the top turn around and walk down. (1) Moderate impairment-Two feet to a  stair; must use rail.  Total 18/30   Interpretation of scores: Non-Specific Older Adults Cutoff Score: <=22/30 = risk of falls Parkinsons Disease Cutoff score <15/30= fall risk (Hoehn & Yahr 1-4)  Minimally Clinically Important Difference (MCID)  Stroke (acute, subacute, and chronic) = MDC: 4.2 points Vestibular (acute) = MDC: 6 points Community Dwelling Older Adults =  MCID: 4 points Parkinsons Disease  =  MDC: 4.3 points  ([employer]y [employer] (nd). Functional Gait Assessment. Retrieved from https://www.neuropt.org/docs/default-source/cpgs/core-outcome-measures/function-gait-assessment-pocket-guide-proof9-(2).pdf?sfvrsn=b23f35043_0.)  Interpretation of scores: Non-Specific Older Adults Cutoff Score: <=22/30 = risk of falls Parkinsons Disease Cutoff score <15/30= fall risk (Hoehn & Yahr 1-4)  Minimally Clinically Important Difference (MCID)  Stroke (acute, subacute, and chronic) = MDC: 4.2 points Vestibular (acute) = MDC: 6 points Community Dwelling Older Adults =  MCID: 4 points Parkinsons Disease  =  MDC: 4.3 points  (  Academy of Neu[employer]ic Physical Therapy (nd). Functional Gait Assessment. Retrieved from https://www.neuropt.org/docs/default-source/cpgs/core-outcome-measures/function-gait-assessment-pocket-guide-proof9-(2).pdf?sfvrsn=b47f35043_0.)  GAIT: Safe gait patten with walking stick  TREATMENT DATE:                                                                                                                               08/11/24 Nustep level 5 x 5 min Standing on balance pad: marching x 20 did not need UE support today Standing on balance pad: mini squats 2 x 10 did not need UE support Step up and hold on balance pad with single UE support x 10 on each LE fwd then lateral Steam engines x 20 with 3# Seated bicep curls 2 x 10 with 3# Seated tricep press with red tband 2 x 10 each UE Seated UE punches x 20 with 2# Seated clam with yellow loop x  20 Seated LAQ with 2 lb aw 2 x 10 each LE Seated march x 20 with 2 lb aw Seated hip ER 2 x 10 with 2 lb aw Sit to stand  2 x 5 with 5 lb kb Squat to table 2 x 5 with 5 lb kb Seated mini sit up 2 x 10 with 5# kb Seated modified Russian twist with 5# kb x 20 Seated hip to shoulder 2 x 10 each side with 5# kb  08/06/24 Nustep level 5 x 5 min Standing on balance pad: marching x 20 with single UE support Standing on balance pad: mini squats 2 x 10 with single UE support Step up and hold on balance pad with single UE support  Seated clam with yellow loop x 20 Seated LAQ with 2 lb aw 2 x 10 each LE Seated march x 20 with 2 lb aw Seated hip ER 2 x 10 with 2 lb aw Steam engines x 20 with 3# Seated bicep curls 2 x 10 with 3# Seated tricep press with red tband 2 x 10 each UE Seated UE punches x 20 with 2# Seated piriformis stretch 3 x 30 sec each LE Sit to stand  2 x 5 with 5 lb kb Squat to table 2 x 5 with 5 lb kb Seated mini sit up 2 x 10 with 5# kb Seated modified Russian twist with 5# kb x 20 Seated hip to shoulder 2 x 10 each side with 5# kb  07/30/24 Nustep level 5 x 5 min Seated piriformis stretch 3 x 30 sec each LE Sit to stand  2 x 5 with 5 lb kb Squat to table 2 x 5 with 5 lb kb Seated LAQ with 2 lb aw 2 x 10 each LE Seated march x 20 with 2 lb aw Steam engines x 20 with 2# Seated bicep curls 2 x 10 with 2# Seated tricep press with red tband 2 x 10 each UE Seated UE punches x 20 with 2# Seated hamstring curl with red tband 2 x 10 each LE Seated clam with yellow  loop x 20 Lateral band walks with blue loop x 5 laps at back counter Seated mini sit up 2 x 10 with 5# kb Seated modified Russian twist with 5# kb x 20 Seated hip to shoulder 2 x 10 each side with 5# kb  07/21/24 Nustep level 5 x 5 min Seated piriformis stretch 3 x 30 sec each LE Sit to stand x 5 Squat to table x 5 Steam engines x 20 with 2# Seated bicep curls 2 x 10 with 2# Seated tricep press with red  tband 2 x 10 each UE Seated UE punches x 20 with 2# Seated hamstring curl with red tband 2 x 10 each LE At barre: 6 inch step up x 10 each LE fwd then lateral Lateral band walks with blue loop x 5 laps Seated clam with blue loop x 20 Seated mini sit up 2 x 10 with 5# kb Seated modified Russian twist with 5# kb x 20 Seated hip to shoulder 2 x 10 each side with 5# kb  07/02/24  10th visit re-assessment completed Seated biceps curl 2# 2x 10 Seated steam engines 3# 2 x 10 bilateral  5 TSTS and TUG completed Seated tricep press with red tband 2 x 10 each UE Patient was fatigued: we held on further treatment today   PATIENT EDUCATION:  Education details: PT eval findings, anticipated POC, and HEP review   Person educated: Patient Education method: verbally discussed Education comprehension: verbalized understanding  HOME EXERCISE PROGRAM: Access Code: [email or web address] URL: [email or web address] Date: 04/15/2024 Prepared by: Mliss (previous HEP from recent episode)  Exercises - Sit to Stand with Arms Crossed  - 1 x daily - 5 x weekly - 2 sets - 10 reps - Standing Hamstring Curl with Chair Support  - 1 x daily - 7 x weekly - 3 sets - 10-15 reps - Standing March with Counter Support  - 1 x daily - 7 x weekly - 2-3 sets - 1 min hold - Side Stepping with Counter Support  - 1 x daily - 5 x weekly - 2 sets - 1 min hold - Forward Step Up  - 1 x daily - 5 x weekly - 2-3 sets - 30 sec hold - Walking with Eyes Closed and Counter Support  - 1 x daily - 5 x weekly - 2 sets - 5 reps - Standing Near Stance in Corner with Eyes Closed  - 1 x daily - 5 x weekly - 3 sets - 30 sec hold  ASSESSMENT:  CLINICAL IMPRESSION: Michelle Wilcox continues to improve.  She is tolerating more standing activity and did not need UE support on her step us  and hold balance tasks today.  She is well motivated and compliant. She should continue to do well.  She would benefit from continued skilled PT to address the  below impairments and improve overall function.   Eval: Patient is a 78 y.o. female who was seen today for physical therapy evaluation and treatment for osteoporosis and fall prevention . She was recently discharged from neuro PT due to complications from Afib. She has recently returned to work and has deficits with endurance with standing and walking  affecting work and grocery shopping. She also reports deficits with carrying over 10#. She denies pain. She continues to report balance deficits, but states her balance is much improved and it depends on the day. She will benefit from skilled PT to address these deficits and those listed below. Due to her RTW she will only  be available 1x/wk.  OBJECTIVE IMPAIRMENTS: cardiopulmonary status limiting activity, decreased activity tolerance, decreased balance, difficulty walking, and decreased strength.   ACTIVITY LIMITATIONS: carrying, lifting, standing, and locomotion level  PARTICIPATION LIMITATIONS: laundry, shopping, and occupation  PERSONAL FACTORS: Age, Fitness, Past/current experiences, Time since onset of injury/illness/exacerbation, and 3+ comorbidities: OP, Afib, vertigo are also affecting patient's functional outcome.   REHAB POTENTIAL: Good  CLINICAL DECISION MAKING: Evolving/moderate complexity  EVALUATION COMPLEXITY: Moderate   GOALS: Goals reviewed with patient? Yes  SHORT TERM GOALS: Target date: 05/27/24  Ind with initial HEP Baseline: Goal status: MET 06/11/24  2.  Decreased TUG by 2-3 seconds showing decreased risk for falls. Baseline: 13.77 sec Goal status: In progress  3.  Decreased 5XSTS by 2-3 seconds showing functional improvement in strength Baseline:  Goal status: In progress   LONG TERM GOALS: Target date: 07/08/24  Ind with advanced HEP Baseline:  Goal status: MET 07/30/24  2.  Improved average PSFS by 2 points showing functional improvement Baseline: 2.75 Goal status: MET 06/11/24  3.  Patient to  report improved endurance with grocery shopping by 50%  Baseline:  Goal status: MET 07/30/24  4.  Improved to 1,100 ft or more with device as needed Baseline: 923 ft Goal status: In Progress  5.  FGA to improve at least 4 points demonstrating decreased risk for falls Baseline: TBD Goal status: in progress  6.  Able to carry >= 10# x 25 ft showing improved functional UE strength Baseline:  Goal status: In progress  7. Improved standing tolerance to > 15-20 min without AD Baseline:  Goal status: In progress  PLAN:  PT FREQUENCY: 1x/week  PT DURATION: 12 weeks  PLANNED INTERVENTIONS: [id number]- PT Re-evaluation, 97110-Therapeutic exercises, 97530- Therapeutic activity, [id number]- Neuromuscular re-education, 97535- Self Care, [id number]- Manual therapy, [phone]- Gait training, Patient/Family education, Balance training, and Stair training.  PLAN FOR NEXT SESSION:  Gait endurance, resistive training, balance, functional LE and core strength for osteoporosis/balance   Sheenah Dimitroff B. Fae Blossom, PT 08/11/2024 10:15 AM [facility] [address] [place], [place] [address] Phone # [phone] Fax [phone]       "

## 2024-08-17 ENCOUNTER — Other Ambulatory Visit: Payer: Self-pay | Admitting: Family Medicine

## 2024-08-17 DIAGNOSIS — Z1231 Encounter for screening mammogram for malignant neoplasm of breast: Secondary | ICD-10-CM

## 2024-08-18 ENCOUNTER — Ambulatory Visit

## 2024-08-27 ENCOUNTER — Ambulatory Visit: Attending: Family Medicine

## 2024-08-27 ENCOUNTER — Ambulatory Visit
Admission: RE | Admit: 2024-08-27 | Discharge: 2024-08-27 | Disposition: A | Discharge status: Home / Self Care | Source: Ambulatory Visit | Attending: Family Medicine | Admitting: Family Medicine

## 2024-08-27 DIAGNOSIS — R293 Abnormal posture: Secondary | ICD-10-CM | POA: Insufficient documentation

## 2024-08-27 DIAGNOSIS — M6281 Muscle weakness (generalized): Secondary | ICD-10-CM | POA: Diagnosis present

## 2024-08-27 DIAGNOSIS — Z1231 Encounter for screening mammogram for malignant neoplasm of breast: Secondary | ICD-10-CM

## 2024-08-27 DIAGNOSIS — R262 Difficulty in walking, not elsewhere classified: Secondary | ICD-10-CM | POA: Diagnosis present

## 2024-08-27 DIAGNOSIS — R2681 Unsteadiness on feet: Secondary | ICD-10-CM | POA: Insufficient documentation

## 2024-08-27 DIAGNOSIS — R252 Cramp and spasm: Secondary | ICD-10-CM | POA: Insufficient documentation

## 2024-08-27 DIAGNOSIS — M5459 Other low back pain: Secondary | ICD-10-CM | POA: Insufficient documentation

## 2024-08-27 NOTE — Therapy (Signed)
 " OUTPATIENT PHYSICAL THERAPY THORACOLUMBAR TREATMENT      Patient Name: Michelle Wilcox MRN: [id number] DOB:October 11, 1945, 79 y.o., female Today's Date: 08/27/2024  END OF SESSION:  PT End of Session - 08/27/24 1547     Visit Number 15    Date for Recertification  09/03/24    Authorization Type BCBS Medicare    PT Start Time 1538    PT Stop Time 1612    PT Time Calculation (min) 34 min    Activity Tolerance Patient tolerated treatment well    Behavior During Therapy WFL for tasks assessed/performed              Past Medical History:  Diagnosis Date   Anxiety    hx of   Asthma    at times   CHF (congestive heart failure) (HCC)    Depression    hx of   Dyspnea    Giant cell arteritis (HCC)    Gouty arthropathy    Hemolytic anemia    Hyperlipidemia    Hypertension    Irregular heart beat    extra beat   Migraines    Polymyalgia    Polymyalgia rheumatica    Temporal arteritis (HCC)    Past Surgical History:  Procedure Laterality Date   ATRIAL FIBRILLATION ABLATION N/A 06/26/2024   Procedure: ATRIAL FIBRILLATION ABLATION;  Surgeon: Kennyth Chew, MD;  Location: [facility];  Service: Cardiovascular;  Laterality: N/A;   BARTHOLIN GLAND CYST EXCISION     non ca   BREAST BIOPSY Right    CARDIOVERSION N/A 11/05/2023   Procedure: CARDIOVERSION;  Surgeon: Sheena Pugh, DO;  Location: [facility];  Service: Cardiovascular;  Laterality: N/A;   CARDIOVERSION N/A 12/19/2023   Procedure: CARDIOVERSION;  Surgeon: Santo Stanly LABOR, MD;  Location: [facility];  Service: Cardiovascular;  Laterality: N/A;   EYE MUSCLE SURGERY     as a child 52 yrs old   KNEE SURGERY  2004   rt knee   LAPAROSCOPIC SPLENECTOMY N/A 06/18/2018   Procedure: LAPAROSCOPIC SPLENECTOMY ERAS PATHWAY;  Surgeon: Mikell Katz, MD;  Location: [facility];  Service: General;  Laterality: N/A;   RIGHT/LEFT HEART CATH AND CORONARY ANGIOGRAPHY N/A 03/04/2024   Procedure:  RIGHT/LEFT HEART CATH AND CORONARY ANGIOGRAPHY;  Surgeon: Anner Alm ORN, MD;  Location: [facility];  Service: Cardiovascular;  Laterality: N/A;   toncil     TONSILLECTOMY     as a child   TRANSESOPHAGEAL ECHOCARDIOGRAM (CATH LAB) N/A 11/05/2023   Procedure: TRANSESOPHAGEAL ECHOCARDIOGRAM;  Surgeon: Sheena Pugh, DO;  Location: [facility];  Service: Cardiovascular;  Laterality: N/A;   TRANSESOPHAGEAL ECHOCARDIOGRAM (CATH LAB) N/A 12/19/2023   Procedure: TRANSESOPHAGEAL ECHOCARDIOGRAM;  Surgeon: Santo Stanly LABOR, MD;  Location: [facility];  Service: Cardiovascular;  Laterality: N/A;   Patient Active Problem List   Diagnosis Date Noted   Hypercoagulable state due to paroxysmal atrial fibrillation (HCC) 07/07/2024   Nonischemic cardiomyopathy (HCC) 12/24/2023   Pericardial effusion 11/07/2023   Borderline abnormal TFTs 11/05/2023   Macrocytosis 11/05/2023   Paroxysmal atrial fibrillation (HCC) 11/04/2023   PMR (polymyalgia rheumatica) 11/04/2023   Primary localized osteoarthrosis of multiple sites 06/15/2022   Diastolic dysfunction without heart failure 03/05/2018   DOE (dyspnea on exertion) 03/05/2018   Preop cardiovascular exam 03/05/2018   Mild reactive airways disease 08/04/2017   Hypokalemia 08/03/2017   Essential hypertension 08/03/2017   Hyperlipidemia 08/03/2017   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  Hypersensitivity reaction 11/20/2016   Drusen (degenerative) of retina, bilateral 05/22/2016   Nuclear cataract 05/22/2016   Retinal hemorrhage of left eye 05/22/2016   Temporal arteritis (HCC) 05/22/2016   Hemolytic anemia 03/19/2016   Breast mass, right 07/01/2012    PCP: Aisha Harvey, MD'  REFERRING PROVIDER: Aisha Harvey, MD   REFERRING DIAG: M81.0 (ICD-10-CM) - Age-related osteoporosis without current pathological fracture   Rationale for Evaluation and Treatment: Rehabilitation  THERAPY DIAG:  Difficulty in walking, not  elsewhere classified  Unsteadiness on feet  Muscle weakness (generalized)  Cramp and spasm  Other low back pain  Abnormal posture  ONSET DATE: 11/04/23  SUBJECTIVE:                                                                                                                                                                                           SUBJECTIVE STATEMENT: Patient reports just tired today.  We've been really busy at work  But overall doing well.     Eval: Here for OP ,She is still in Afib all the time (see history below). They want her to have an ablation now, before the end of the year. Seeing cardiologist tomorrow. She has been back to work since Aug 2 at [employer]. 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 Working 32 hours. Would like 1x/wk. She is challenged with back to back tours as she is talking. She still feels unsteady at times. It depends on the day. Standing is limited to 10 minutes due to fatigue.   PERTINENT HISTORY:   Vertigo, 12/31/2023:  Pt underwent attempt at cardioversion for a-fib on 12/19/23, which did not last >24 hrs; was hospitalized after ED visit for SOB, CHF.  Anxiety, asthma, CHF, depression, gouty arthropathy, anemia, HLD, HTN, migraines, polymyalgia rheumatica, temporal arteritis, R knee surgery   PAIN:  Are you having pain? No  PRECAUTIONS: None  RED FLAGS: None   WEIGHT BEARING RESTRICTIONS: No  FALLS:  Has patient fallen in last 6 months? No  LIVING ENVIRONMENT: Lives with: lives with their spouse who have stage 4 stomach CA (pt reports that she is having to physically assist him some) Lives in: Other townhouse  Stairs: 2 stories, sleeps on 1st floor in recliner  Has following equipment at home: Environmental Consultant - 2 wheeled, cane  OCCUPATION: works 32 hours per week  PLOF: Independent with basic ADLs, Independent with household mobility without device, Independent with community mobility with device, and  Vocation/Vocational requirements: walking and talking  PATIENT GOALS: Help to do with osteoporosis and balance still  NEXT MD VISIT: unsure  OBJECTIVE:  Note: Objective  measures were completed at Evaluation unless otherwise noted.  DIAGNOSTIC FINDINGS:  None recent  PATIENT SURVEYS:  PSFS: THE PATIENT SPECIFIC FUNCTIONAL SCALE  Place score of 0-10 (0 = unable to perform activity and 10 = able to perform activity at the same level as before injury or problem)  Activity Date: 04/15/24 Date: 06/11/24 Date: 07/02/24 Patient just had ablation and is quite limited from a cardiac standpoint  Standing for conversation or tasks unassisted   2     5   7   2.endurance with walking   4     6   6   3. Managing grocery store runs (including unloading and putting things away)   3     5    2   4. Picking up 10# or more and carry   2     5 0  Total Score 2.75 5.25     Total Score = Sum of activity scores/number of activities  Minimally Detectable Change: 3 points (for single activity); 2 points (for average score)  [facility] (nd). The Patient Specific Functional Scale . Retrieved from [email or web address]   COGNITION: Overall cognitive status: Within functional limits for tasks assessed     SENSATION: WFL   LUMBAR ROM: WNL for tasks assessed   LOWER EXTREMITY ROM:    WNL for tasks assessed   LOWER EXTREMITY MMT:  4 to 5/5 B tested in sitting    FUNCTIONAL TESTS:  5 times sit to stand: 17.01 sec  06/23/24: 5 times sit to stand: 18.39 sec (a little worse but patient preparing for ablation) TUG 06/23/24: TUG: 11.71 sec 6 minute walk test: 923 ft RPE 5/10 vitals PR 94 bpm, O2 sats 90%   TUG 07/02/24: TUG: 14.19 sec 07/02/24 5 times sit to stand: 30.33 sec   Functional gait assessment:  FUNCTIONAL GAIT ASSESSMENT  Date: 04/29/24 Score  GAIT LEVEL SURFACE Instructions: Walk at your normal speed from here to the  next mark (6 m) [20 ft]. (2) Mild impairment - Walks 6 m (20 ft) in less than 7 seconds but greater than 5.5 seconds, uses assistive device, slower speed, mild gait deviations, or deviates 15.24 -25.4 cm (6 -10 in) outside of the 30.48-cm (12-in) walkway width.  2.   CHANGE IN GAIT SPEED Instructions: Begin walking at your normal pace (for 1.5 m [5 ft]). When I tell you go, walk as fast as you can (for 1.5 m [5 ft]). When I tell you slow, walk as slowly as you can (for 1.5 m [5 ft]. (2) Mild impairment - Is able to change speed but demonstrates mild gait deviations, deviates 15.24 -25.4 cm (6 -10 in) outside of the 30.48-cm (12-in) walkway width, or no gait deviations but unable to achieve a significant change in velocity, or uses an assistive device  3.    GAIT WITH HORIZONTAL HEAD TURNS Instructions: Walk from here to the next mark 6 m (20 ft) away. Begin walking at your normal pace. Keep walking straight; after 3 steps, turn your head to the right and keep walking straight while looking to the right. After 3 more steps, turn your head to the left and keep walking straight while looking left. Continue alternating looking right and left. (2) Mild impairment - Performs head turns smoothly with slight change in gait velocity (eg, minor disruption to smooth gait path), deviates 15.24 -25.4 cm (6 -10 in) outside 30.48-cm (12-in) walkway width, or uses an assistive device.  4.   GAIT WITH  VERTICAL HEAD TURNS Instructions: Walk from here to the next mark (6 m [20 ft]). Begin walking at your normal pace. Keep walking straight; after 3 steps, tip your head up and keep walking straight while looking up. After 3 more steps, tip your head down, keep walking straight while looking down. Continue  alternating looking up and down every 3 steps until you have completed 2 repetitions in each direction. (2) Mild impairment - Performs task with slight change in gait velocity (eg, minor disruption to smooth gait path),  deviates 15.24 -25.4 cm (6 -10 in) outside 30.48-cm (12-in) walkway width or uses assistive device.  5.  GAIT AND PIVOT TURN Instructions: Begin with walking at your normal pace. When I tell you, turn and stop, turn as quickly as you can to face the opposite direction and stop. (2) Mild impairment - Pivot turns safely in 3 seconds and stops with no loss of balance, or pivot turns safely within 3 seconds and stops with mild imbalance, requires small steps to catch balance  6.   STEP OVER OBSTACLE Instructions: Begin walking at your normal speed. When you come to the shoe box, step over it, not around it, and keep walking. (2) Mild impairment - Is able to step over one shoe box (11.43 cm [4.5 in] total height) without changing gait speed; no evidence of imbalance.  7.   GAIT WITH NARROW BASE OF SUPPORT Instructions: Walk on the floor with arms folded across the chest, feet aligned heel to toe in tandem for a distance of 3.6 m [12 ft]. The number of steps taken in a straight line are counted for a maximum of 10 steps. (2) Mild impairment - Ambulates 7-9 steps  8.   GAIT WITH EYES CLOSED Instructions: Walk at your normal speed from here to the next mark (6 m [20 ft]) with your eyes closed. (1) Moderate impairment - Walks 6 m (20 ft), slow speed, abnormal gait pattern, evidence for imbalance, deviates 25.4 -38.1 cm (10 -15 in) outside 30.48-cm (12-in) walkway width. Requires more than 9 seconds to ambulate 6 m (20 ft).  9.   AMBULATING BACKWARDS Instructions: Walk backwards until I tell you to stop (2) Mild impairment - Walks 6 m (20 ft), uses assistive device, slower speed, mild gait deviations, deviates 15.24 -25.4 cm (6 -10 in) outside 30.48-cm (12-in) walkway width  10. STEPS Instructions: Walk up these stairs as you would at home (ie, using the rail if necessary). At the top turn around and walk down. (1) Moderate impairment-Two feet to a stair; must use rail.  Total 18/30   Interpretation of  scores: Non-Specific Older Adults Cutoff Score: <=22/30 = risk of falls Parkinsons Disease Cutoff score <15/30= fall risk (Hoehn & Yahr 1-4)  Minimally Clinically Important Difference (MCID)  Stroke (acute, subacute, and chronic) = MDC: 4.2 points Vestibular (acute) = MDC: 6 points Community Dwelling Older Adults =  MCID: 4 points Parkinsons Disease  =  MDC: 4.3 points  (Academy of Neu[employer]ic [employer] (nd). Functional Gait Assessment. Retrieved from https://www.neuropt.org/docs/default-source/cpgs/core-outcome-measures/function-gait-assessment-pocket-guide-proof9-(2).pdf?sfvrsn=b37f35043_0.)  Interpretation of scores: Non-Specific Older Adults Cutoff Score: <=22/30 = risk of falls Parkinsons Disease Cutoff score <15/30= fall risk (Hoehn & Yahr 1-4)  Minimally Clinically Important Difference (MCID)  Stroke (acute, subacute, and chronic) = MDC: 4.2 points Vestibular (acute) = MDC: 6 points Community Dwelling Older Adults =  MCID: 4 points Parkinsons Disease  =  MDC: 4.3 points  (Academy of Neurologic Physical Therapy (nd). Functional Gait Assessment. Retrieved from  https://www.neuropt.org/docs/default-source/cpgs/core-outcome-measures/function-gait-assessment-pocket-guide-proof9-(2).pdf?sfvrsn=b65f35043_0.)  GAIT: Safe gait patten with walking stick  08/27/24 Nustep level 5 x 5 min Sit to stand x 10 Squat to table x 5 Isometric hip adduction with purple ball x 20 Seated clam with yellow loop x 20 Seated hamstring curl 2 x 10 with red tband (PT anchoring) Seated bicep curls 2 x 10 with 2# Seated tricep press with red tband 2 x 10 each UE Steam engines x 20 with 2# Seated UE punches x 20 with 2# Standing in tandem on balance pads x 1 min (2 sets - one with each foot in front) Standing on balance pad: marching x 20 did not need UE support today Standing on balance pad: mini squats 2 x 10 did not need UE support Step up and hold on balance pad with single UE support x 10 on  each LE fwd then lateral  08/11/24 Nustep level 5 x 5 min Standing on balance pad: marching x 20 did not need UE support today Standing on balance pad: mini squats 2 x 10 did not need UE support Step up and hold on balance pad with single UE support x 10 on each LE fwd then lateral Steam engines x 20 with 3# Seated bicep curls 2 x 10 with 3# Seated tricep press with red tband 2 x 10 each UE Seated UE punches x 20 with 2# Seated clam with yellow loop x 20 Seated LAQ with 2 lb aw 2 x 10 each LE Seated march x 20 with 2 lb aw Seated hip ER 2 x 10 with 2 lb aw Sit to stand  2 x 5 with 5 lb kb Squat to table 2 x 5 with 5 lb kb Seated mini sit up 2 x 10 with 5# kb Seated modified Russian twist with 5# kb x 20 Seated hip to shoulder 2 x 10 each side with 5# kb  08/06/24 Nustep level 5 x 5 min Standing on balance pad: marching x 20 with single UE support Standing on balance pad: mini squats 2 x 10 with single UE support Step up and hold on balance pad with single UE support  Seated clam with yellow loop x 20 Seated LAQ with 2 lb aw 2 x 10 each LE Seated march x 20 with 2 lb aw Seated hip ER 2 x 10 with 2 lb aw Steam engines x 20 with 3# Seated bicep curls 2 x 10 with 3# Seated tricep press with red tband 2 x 10 each UE Seated UE punches x 20 with 2# Seated piriformis stretch 3 x 30 sec each LE Sit to stand  2 x 5 with 5 lb kb Squat to table 2 x 5 with 5 lb kb Seated mini sit up 2 x 10 with 5# kb Seated modified Russian twist with 5# kb x 20 Seated hip to shoulder 2 x 10 each side with 5# kb   PATIENT EDUCATION:  Education details: PT eval findings, anticipated POC, and HEP review   Person educated: Patient Education method: verbally discussed Education comprehension: verbalized understanding  HOME EXERCISE PROGRAM: Access Code: [email or web address] URL: [email or web address] Date: 04/15/2024 Prepared by: Mliss (previous HEP from recent episode)  Exercises - Sit to  Stand with Arms Crossed  - 1 x daily - 5 x weekly - 2 sets - 10 reps - Standing Hamstring Curl with Chair Support  - 1 x daily - 7 x weekly - 3 sets - 10-15 reps - Standing March with Counter Support  - 1  x daily - 7 x weekly - 2-3 sets - 1 min hold - Side Stepping with Counter Support  - 1 x daily - 5 x weekly - 2 sets - 1 min hold - Forward Step Up  - 1 x daily - 5 x weekly - 2-3 sets - 30 sec hold - Walking with Eyes Closed and Counter Support  - 1 x daily - 5 x weekly - 2 sets - 5 reps - Standing Near Stance in Corner with Eyes Closed  - 1 x daily - 5 x weekly - 3 sets - 30 sec hold  ASSESSMENT:  CLINICAL IMPRESSION: Mekala had to cancel last visit and has not been to PT since 12/23.  She was 8 min late for appt and we scaled back her workout due to the time between her last appt and today.  We focused a lot on balance today, challenging her with several tasks without UE support.  She was quite fearful initially, causing her to be unsteady.  However, she improved with several challenging tasks.  She speaks of having PTSD from the fall she had at work which makes her even more anxious.  She would benefit from continuing skilled PT to progress toward goals below.     Eval: Patient is a 79 y.o. female who was seen today for physical therapy evaluation and treatment for osteoporosis and fall prevention . She was recently discharged from neuro PT due to complications from Afib. She has recently returned to work and has deficits with endurance with standing and walking  affecting work and grocery shopping. She also reports deficits with carrying over 10#. She denies pain. She continues to report balance deficits, but states her balance is much improved and it depends on the day. She will benefit from skilled PT to address these deficits and those listed below. Due to her RTW she will only be available 1x/wk.  OBJECTIVE IMPAIRMENTS: cardiopulmonary status limiting activity, decreased activity tolerance,  decreased balance, difficulty walking, and decreased strength.   ACTIVITY LIMITATIONS: carrying, lifting, standing, and locomotion level  PARTICIPATION LIMITATIONS: laundry, shopping, and occupation  PERSONAL FACTORS: Age, Fitness, Past/current experiences, Time since onset of injury/illness/exacerbation, and 3+ comorbidities: OP, Afib, vertigo are also affecting patient's functional outcome.   REHAB POTENTIAL: Good  CLINICAL DECISION MAKING: Evolving/moderate complexity  EVALUATION COMPLEXITY: Moderate   GOALS: Goals reviewed with patient? Yes  SHORT TERM GOALS: Target date: 05/27/24  Ind with initial HEP Baseline: Goal status: MET 06/11/24  2.  Decreased TUG by 2-3 seconds showing decreased risk for falls. Baseline: 13.77 sec Goal status: In progress  3.  Decreased 5XSTS by 2-3 seconds showing functional improvement in strength Baseline:  Goal status: In progress   LONG TERM GOALS: Target date: 07/08/24  Ind with advanced HEP Baseline:  Goal status: MET 07/30/24  2.  Improved average PSFS by 2 points showing functional improvement Baseline: 2.75 Goal status: MET 06/11/24  3.  Patient to report improved endurance with grocery shopping by 50%  Baseline:  Goal status: MET 07/30/24  4.  Improved to 1,100 ft or more with device as needed Baseline: 923 ft Goal status: In Progress  5.  FGA to improve at least 4 points demonstrating decreased risk for falls Baseline: TBD Goal status: in progress  6.  Able to carry >= 10# x 25 ft showing improved functional UE strength Baseline:  Goal status: In progress  7. Improved standing tolerance to > 15-20 min without AD Baseline:  Goal  status: In progress  PLAN:  PT FREQUENCY: 1x/week  PT DURATION: 12 weeks  PLANNED INTERVENTIONS: 97164- PT Re-evaluation, 97110-Therapeutic exercises, 97530- Therapeutic activity, [id number]- Neuromuscular re-education, 97535- Self Care, [id number]- Manual therapy, [phone]- Gait training,  Patient/Family education, Balance training, and Stair training.  PLAN FOR NEXT SESSION:  Gait endurance, resistive training, balance, functional LE and core strength for osteoporosis/balance   Zavian Slowey B. Jezel Basto, PT 08/27/2024 4:25 PM [facility] [address], [address] [place], [place] [address] Phone # [phone] Fax [phone]       "

## 2024-09-09 ENCOUNTER — Ambulatory Visit

## 2024-09-09 DIAGNOSIS — R293 Abnormal posture: Secondary | ICD-10-CM

## 2024-09-09 DIAGNOSIS — M5459 Other low back pain: Secondary | ICD-10-CM

## 2024-09-09 DIAGNOSIS — R262 Difficulty in walking, not elsewhere classified: Secondary | ICD-10-CM

## 2024-09-09 DIAGNOSIS — R252 Cramp and spasm: Secondary | ICD-10-CM

## 2024-09-09 DIAGNOSIS — M6281 Muscle weakness (generalized): Secondary | ICD-10-CM

## 2024-09-09 DIAGNOSIS — R2681 Unsteadiness on feet: Secondary | ICD-10-CM

## 2024-09-09 NOTE — Therapy (Signed)
 " OUTPATIENT PHYSICAL THERAPY THORACOLUMBAR TREATMENT      Patient Name: Michelle Wilcox MRN: [id number] DOB:July 04, 1946, 79 y.o., female Today's Date: 09/09/2024  END OF SESSION:  PT End of Session - 09/09/24 0805     Visit Number 16    Date for Recertification  10/30/24    Authorization Type BCBS Medicare    Authorization Time Period Recert completed 09/09/24    Progress Note Due on Visit 20    PT Start Time 0806    PT Stop Time 0841    PT Time Calculation (min) 35 min    Activity Tolerance Patient tolerated treatment well    Behavior During Therapy [facility] for tasks assessed/performed              Past Medical History:  Diagnosis Date   Anxiety    hx of   Asthma    at times   CHF (congestive heart failure) (HCC)    Depression    hx of   Dyspnea    Giant cell arteritis (HCC)    Gouty arthropathy    Hemolytic anemia    Hyperlipidemia    Hypertension    Irregular heart beat    extra beat   Migraines    Polymyalgia    Polymyalgia rheumatica    Temporal arteritis (HCC)    Past Surgical History:  Procedure Laterality Date   ATRIAL FIBRILLATION ABLATION N/A 06/26/2024   Procedure: ATRIAL FIBRILLATION ABLATION;  Surgeon: Kennyth Chew, MD;  Location: [facility];  Service: Cardiovascular;  Laterality: N/A;   BARTHOLIN GLAND CYST EXCISION     non ca   BREAST BIOPSY Right    CARDIOVERSION N/A 11/05/2023   Procedure: CARDIOVERSION;  Surgeon: Sheena Pugh, DO;  Location: [facility];  Service: Cardiovascular;  Laterality: N/A;   CARDIOVERSION N/A 12/19/2023   Procedure: CARDIOVERSION;  Surgeon: Santo Stanly LABOR, MD;  Location: [facility];  Service: Cardiovascular;  Laterality: N/A;   EYE MUSCLE SURGERY     as a child 37 yrs old   KNEE SURGERY  2004   rt knee   LAPAROSCOPIC SPLENECTOMY N/A 06/18/2018   Procedure: LAPAROSCOPIC SPLENECTOMY ERAS PATHWAY;  Surgeon: Mikell Katz, MD;  Location: [facility];  Service: General;   Laterality: N/A;   RIGHT/LEFT HEART CATH AND CORONARY ANGIOGRAPHY N/A 03/04/2024   Procedure: RIGHT/LEFT HEART CATH AND CORONARY ANGIOGRAPHY;  Surgeon: Anner Alm ORN, MD;  Location: [facility] INVASIVE CV LAB;  Service: Cardiovascular;  Laterality: N/A;   toncil     TONSILLECTOMY     as a child   TRANSESOPHAGEAL ECHOCARDIOGRAM (CATH LAB) N/A 11/05/2023   Procedure: TRANSESOPHAGEAL ECHOCARDIOGRAM;  Surgeon: Sheena Pugh, DO;  Location: [facility];  Service: Cardiovascular;  Laterality: N/A;   TRANSESOPHAGEAL ECHOCARDIOGRAM (CATH LAB) N/A 12/19/2023   Procedure: TRANSESOPHAGEAL ECHOCARDIOGRAM;  Surgeon: Santo Stanly LABOR, MD;  Location: [facility];  Service: Cardiovascular;  Laterality: N/A;   Patient Active Problem List   Diagnosis Date Noted   Hypercoagulable state due to paroxysmal atrial fibrillation (HCC) 07/07/2024   Nonischemic cardiomyopathy (HCC) 12/24/2023   Pericardial effusion 11/07/2023   Borderline abnormal TFTs 11/05/2023   Macrocytosis 11/05/2023   Paroxysmal atrial fibrillation (HCC) 11/04/2023   PMR (polymyalgia rheumatica) 11/04/2023   Primary localized osteoarthrosis of multiple sites 06/15/2022   Diastolic dysfunction without heart failure 03/05/2018   DOE (dyspnea on exertion) 03/05/2018   Preop cardiovascular exam 03/05/2018   Mild reactive airways disease 08/04/2017  Hypokalemia 08/03/2017   Essential hypertension 08/03/2017   Hyperlipidemia 08/03/2017   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   Hypersensitivity reaction 11/20/2016   Drusen (degenerative) of retina, bilateral 05/22/2016   Nuclear cataract 05/22/2016   Retinal hemorrhage of left eye 05/22/2016   Temporal arteritis (HCC) 05/22/2016   Hemolytic anemia 03/19/2016   Breast mass, right 07/01/2012    PCP: Aisha Harvey, MD'  REFERRING PROVIDER: Aisha Harvey, MD   REFERRING DIAG: M81.0 (ICD-10-CM) - Age-related osteoporosis without current pathological fracture    Rationale for Evaluation and Treatment: Rehabilitation  THERAPY DIAG:  Difficulty in walking, not elsewhere classified - Plan: PT plan of care cert/re-cert  Unsteadiness on feet - Plan: PT plan of care cert/re-cert  Muscle weakness (generalized) - Plan: PT plan of care cert/re-cert  Cramp and spasm - Plan: PT plan of care cert/re-cert  Other low back pain - Plan: PT plan of care cert/re-cert  Abnormal posture - Plan: PT plan of care cert/re-cert  ONSET DATE: 11/04/23  SUBJECTIVE:                                                                                                                                                                                           SUBJECTIVE STATEMENT: Patient reports she is overall so much better since her cardioversion and no longer being in Afib.  She feels she is gaining strength and able to do more.  She notices that she doesn't have to take as many breaks when giving tours at her job and talking.  She is able to do more chores at home with less rest breaks and she is able to do her full grocery store runs instead of short, more frequent runs.  She is still having assistance with taking her groceries to the car but she is able to unload them when she gets home.  She states she is about 60% better from when she started PT.      Eval: Here for OP ,She is still in Afib all the time (see history below). They want her to have an ablation now, before the end of the year. Seeing cardiologist tomorrow. She has been back to work since Aug 2 at [employer]. 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 Working 32 hours. Would like 1x/wk. She is challenged with back to back tours as she is talking. She still feels unsteady at times. It depends on the day. Standing is limited to 10 minutes due to fatigue.   PERTINENT HISTORY:   Vertigo, 12/31/2023:  Pt underwent attempt at cardioversion for a-fib on 12/19/23, which did not last >24 hrs;  was  hospitalized after ED visit for SOB, CHF.  Anxiety, asthma, CHF, depression, gouty arthropathy, anemia, HLD, HTN, migraines, polymyalgia rheumatica, temporal arteritis, R knee surgery   PAIN:  Are you having pain? No  PRECAUTIONS: None  RED FLAGS: None   WEIGHT BEARING RESTRICTIONS: No  FALLS:  Has patient fallen in last 6 months? No  LIVING ENVIRONMENT: Lives with: lives with their spouse who have stage 4 stomach CA (pt reports that she is having to physically assist him some) Lives in: Other townhouse  Stairs: 2 stories, sleeps on 1st floor in recliner  Has following equipment at home: Environmental Consultant - 2 wheeled, cane  OCCUPATION: works 32 hours per week  PLOF: Independent with basic ADLs, Independent with household mobility without device, Independent with community mobility with device, and Vocation/Vocational requirements: walking and talking  PATIENT GOALS: Help to do with osteoporosis and balance still  NEXT MD VISIT: unsure  OBJECTIVE:  Note: Objective measures were completed at Evaluation unless otherwise noted.  DIAGNOSTIC FINDINGS:  None recent  PATIENT SURVEYS:  PSFS: THE PATIENT SPECIFIC FUNCTIONAL SCALE  Place score of 0-10 (0 = unable to perform activity and 10 = able to perform activity at the same level as before injury or problem)  Activity Date: 04/15/24 Date: 06/11/24 Date: 07/02/24 Patient just had ablation and is quite limited from a cardiac standpoint 09/09/24  Standing for conversation or tasks unassisted   2     5   7 6   2.endurance with walking   4     6   6 6   3. Managing grocery store runs (including unloading and putting things away)   3     5    2 6   4. Picking up 10# or more and carry   2     5 0 5  Total Score 2.75 5.25  5.75    Total Score = Sum of activity scores/number of activities  Minimally Detectable Change: 3 points (for single activity); 2 points (for average score)  [facility] (nd). The Patient Specific Functional  Scale . Retrieved from [email or web address].com.[email or web address]   COGNITION: Overall cognitive status: Within functional limits for tasks assessed     SENSATION: WFL   LUMBAR ROM: WNL for tasks assessed   LOWER EXTREMITY ROM:    WNL for tasks assessed   LOWER EXTREMITY MMT:  4 to 5/5 B tested in sitting       09/09/24:  4+/5 generally bilateral LE's    FUNCTIONAL TESTS:  5 times sit to stand: 17.01 sec  06/23/24: 5 times sit to stand: 18.39 sec (a little worse but patient preparing for ablation) TUG 06/23/24: TUG: 11.71 sec 6 minute walk test: (with cane) 923 ft RPE 5/10 vitals PR 94 bpm, O2 sats 90%   TUG 07/02/24: TUG: 14.19 sec 07/02/24 5 times sit to stand: 30.33 sec  09/09/24: TUG: 12.34 sec 5 TSTS: 15.58 sec 6 MWT: (with cane) 1010.5  Functional gait assessment:  FUNCTIONAL GAIT ASSESSMENT   Date: 04/29/24 Score 09/09/24  GAIT LEVEL SURFACE Instructions: Walk at your normal speed from here to the next mark (6 m) [20 ft]. (2) Mild impairment - Walks 6 m (20 ft) in less than 7 seconds but greater than 5.5 seconds, uses assistive device, slower speed, mild gait deviations, or deviates 15.24 -25.4 cm (6 -10 in) outside of the 30.48-cm (12-in) walkway width. 2  2.   CHANGE IN GAIT SPEED Instructions: Begin walking at your normal pace (for  1.5 m [5 ft]). When I tell you go, walk as fast as you can (for 1.5 m [5 ft]). When I tell you slow, walk as slowly as you can (for 1.5 m [5 ft]. (2) Mild impairment - Is able to change speed but demonstrates mild gait deviations, deviates 15.24 -25.4 cm (6 -10 in) outside of the 30.48-cm (12-in) walkway width, or no gait deviations but unable to achieve a significant change in velocity, or uses an assistive device 2  3.    GAIT WITH HORIZONTAL HEAD TURNS Instructions: Walk from here to the next mark 6 m (20 ft) away. Begin walking at your normal pace. Keep walking straight; after 3 steps, turn your head  to the right and keep walking straight while looking to the right. After 3 more steps, turn your head to the left and keep walking straight while looking left. Continue alternating looking right and left. (2) Mild impairment - Performs head turns smoothly with slight change in gait velocity (eg, minor disruption to smooth gait path), deviates 15.24 -25.4 cm (6 -10 in) outside 30.48-cm (12-in) walkway width, or uses an assistive device. 2  4.   GAIT WITH VERTICAL HEAD TURNS Instructions: Walk from here to the next mark (6 m [20 ft]). Begin walking at your normal pace. Keep walking straight; after 3 steps, tip your head up and keep walking straight while looking up. After 3 more steps, tip your head down, keep walking straight while looking down. Continue  alternating looking up and down every 3 steps until you have completed 2 repetitions in each direction. (2) Mild impairment - Performs task with slight change in gait velocity (eg, minor disruption to smooth gait path), deviates 15.24 -25.4 cm (6 -10 in) outside 30.48-cm (12-in) walkway width or uses assistive device. 2  5.  GAIT AND PIVOT TURN Instructions: Begin with walking at your normal pace. When I tell you, turn and stop, turn as quickly as you can to face the opposite direction and stop. (2) Mild impairment - Pivot turns safely in 3 seconds and stops with no loss of balance, or pivot turns safely within 3 seconds and stops with mild imbalance, requires small steps to catch balance 2  6.   STEP OVER OBSTACLE Instructions: Begin walking at your normal speed. When you come to the shoe box, step over it, not around it, and keep walking. (2) Mild impairment - Is able to step over one shoe box (11.43 cm [4.5 in] total height) without changing gait speed; no evidence of imbalance. 2  7.   GAIT WITH NARROW BASE OF SUPPORT Instructions: Walk on the floor with arms folded across the chest, feet aligned heel to toe in tandem for a distance of 3.6 m [12 ft].  The number of steps taken in a straight line are counted for a maximum of 10 steps. (2) Mild impairment - Ambulates 7-9 steps 2  8.   GAIT WITH EYES CLOSED Instructions: Walk at your normal speed from here to the next mark (6 m [20 ft]) with your eyes closed. (1) Moderate impairment - Walks 6 m (20 ft), slow speed, abnormal gait pattern, evidence for imbalance, deviates 25.4 -38.1 cm (10 -15 in) outside 30.48-cm (12-in) walkway width. Requires more than 9 seconds to ambulate 6 m (20 ft). 2  9.   AMBULATING BACKWARDS Instructions: Walk backwards until I tell you to stop (2) Mild impairment - Walks 6 m (20 ft), uses assistive device, slower speed, mild gait deviations,  deviates 15.24 -25.4 cm (6 -10 in) outside 30.48-cm (12-in) walkway width 2  10. STEPS Instructions: Walk up these stairs as you would at home (ie, using the rail if necessary). At the top turn around and walk down. (1) Moderate impairment-Two feet to a stair; must use rail. 2  Total 18/30 20/30   Interpretation of scores: Non-Specific Older Adults Cutoff Score: <=22/30 = risk of falls Parkinsons Disease Cutoff score <15/30= fall risk (Hoehn & Yahr 1-4)  Minimally Clinically Important Difference (MCID)  Stroke (acute, subacute, and chronic) = MDC: 4.2 points Vestibular (acute) = MDC: 6 points Community Dwelling Older Adults =  MCID: 4 points Parkinsons Disease  =  MDC: 4.3 points  (Academy of [employer]ic [employer] (nd). Functional Gait Assessment. Retrieved from https://www.neuropt.org/docs/default-source/cpgs/core-outcome-measures/function-gait-assessment-pocket-guide-proof9-(2).pdf?sfvrsn=b66f35043_0.)  Interpretation of scores: Non-Specific Older Adults Cutoff Score: <=22/30 = risk of falls Parkinsons Disease Cutoff score <15/30= fall risk (Hoehn & Yahr 1-4)  Minimally Clinically Important Difference (MCID)  Stroke (acute, subacute, and chronic) = MDC: 4.2 points Vestibular (acute) = MDC: 6 points Community  Dwelling Older Adults =  MCID: 4 points Parkinsons Disease  =  MDC: 4.3 points  (Academy of Neurologic Physical Therapy (nd). Functional Gait Assessment. Retrieved from https://www.neuropt.org/docs/default-source/cpgs/core-outcome-measures/function-gait-assessment-pocket-guide-proof9-(2).pdf?sfvrsn=b70f35043_0.)  GAIT: Safe gait patten with walking stick  09/09/24 Nustep x 5 min level 1 Recertification assessment: see objective findings Gait training during walk test emphasizing heel to toe progression   08/27/24 Nustep level 5 x 5 min Sit to stand x 10 Squat to table x 5 Isometric hip adduction with purple ball x 20 Seated clam with yellow loop x 20 Seated hamstring curl 2 x 10 with red tband (PT anchoring) Seated bicep curls 2 x 10 with 2# Seated tricep press with red tband 2 x 10 each UE Steam engines x 20 with 2# Seated UE punches x 20 with 2# Standing in tandem on balance pads x 1 min (2 sets - one with each foot in front) Standing on balance pad: marching x 20 did not need UE support today Standing on balance pad: mini squats 2 x 10 did not need UE support Step up and hold on balance pad with single UE support x 10 on each LE fwd then lateral  08/11/24 Nustep level 5 x 5 min Standing on balance pad: marching x 20 did not need UE support today Standing on balance pad: mini squats 2 x 10 did not need UE support Step up and hold on balance pad with single UE support x 10 on each LE fwd then lateral Steam engines x 20 with 3# Seated bicep curls 2 x 10 with 3# Seated tricep press with red tband 2 x 10 each UE Seated UE punches x 20 with 2# Seated clam with yellow loop x 20 Seated LAQ with 2 lb aw 2 x 10 each LE Seated march x 20 with 2 lb aw Seated hip ER 2 x 10 with 2 lb aw Sit to stand  2 x 5 with 5 lb kb Squat to table 2 x 5 with 5 lb kb Seated mini sit up 2 x 10 with 5# kb Seated modified Russian twist with 5# kb x 20 Seated hip to shoulder 2 x 10 each side with 5#  kb  08/06/24 Nustep level 5 x 5 min Standing on balance pad: marching x 20 with single UE support Standing on balance pad: mini squats 2 x 10 with single UE support Step up and hold on balance pad with single UE  support  Seated clam with yellow loop x 20 Seated LAQ with 2 lb aw 2 x 10 each LE Seated march x 20 with 2 lb aw Seated hip ER 2 x 10 with 2 lb aw Steam engines x 20 with 3# Seated bicep curls 2 x 10 with 3# Seated tricep press with red tband 2 x 10 each UE Seated UE punches x 20 with 2# Seated piriformis stretch 3 x 30 sec each LE Sit to stand  2 x 5 with 5 lb kb Squat to table 2 x 5 with 5 lb kb Seated mini sit up 2 x 10 with 5# kb Seated modified Russian twist with 5# kb x 20 Seated hip to shoulder 2 x 10 each side with 5# kb   PATIENT EDUCATION:  Education details: PT eval findings, anticipated POC, and HEP review   Person educated: Patient Education method: verbally discussed Education comprehension: verbalized understanding  HOME EXERCISE PROGRAM: Access Code: [email or web address] URL: [email or web address] Date: 04/15/2024 Prepared by: Mliss (previous HEP from recent episode)  Exercises - Sit to Stand with Arms Crossed  - 1 x daily - 5 x weekly - 2 sets - 10 reps - Standing Hamstring Curl with Chair Support  - 1 x daily - 7 x weekly - 3 sets - 10-15 reps - Standing March with Counter Support  - 1 x daily - 7 x weekly - 2-3 sets - 1 min hold - Side Stepping with Counter Support  - 1 x daily - 5 x weekly - 2 sets - 1 min hold - Forward Step Up  - 1 x daily - 5 x weekly - 2-3 sets - 30 sec hold - Walking with Eyes Closed and Counter Support  - 1 x daily - 5 x weekly - 2 sets - 5 reps - Standing Near Stance in Corner with Eyes Closed  - 1 x daily - 5 x weekly - 3 sets - 30 sec hold  ASSESSMENT:  CLINICAL IMPRESSION: Lakysha had not been here since her last appt before her cert ran out.  She was last here on 08-27-24 and was supposed to schedule another appt  before re-cert date.  We did a re-assessment today to assess need to continue.  Jannet shows improvement on most objective findings and reports several improvements in her daily function.  She is well motivated and compliant.  She would benefit from continuing skilled PT 1 time per week to address continued leg weakness and unsteady gait due to knee OA and still recovering from procedure to reverse A fib.    Eval: Patient is a 79 y.o. female who was seen today for physical therapy evaluation and treatment for osteoporosis and fall prevention . She was recently discharged from neuro PT due to complications from Afib. She has recently returned to work and has deficits with endurance with standing and walking  affecting work and grocery shopping. She also reports deficits with carrying over 10#. She denies pain. She continues to report balance deficits, but states her balance is much improved and it depends on the day. She will benefit from skilled PT to address these deficits and those listed below. Due to her RTW she will only be available 1x/wk.  OBJECTIVE IMPAIRMENTS: cardiopulmonary status limiting activity, decreased activity tolerance, decreased balance, difficulty walking, and decreased strength.   ACTIVITY LIMITATIONS: carrying, lifting, standing, and locomotion level  PARTICIPATION LIMITATIONS: laundry, shopping, and occupation  PERSONAL FACTORS: Age, Fitness, Past/current experiences, Time since onset of injury/illness/exacerbation, and  3+ comorbidities: OP, Afib, vertigo are also affecting patient's functional outcome.   REHAB POTENTIAL: Good  CLINICAL DECISION MAKING: Evolving/moderate complexity  EVALUATION COMPLEXITY: Moderate   GOALS: Goals reviewed with patient? Yes  SHORT TERM GOALS: Target date: 05/27/24  Ind with initial HEP Baseline: Goal status: MET 06/11/24  2.  Decreased TUG by 2-3 seconds showing decreased risk for falls. Baseline: 13.77 sec Goal status: MET  09/09/24  3.  Decreased 5XSTS by 2-3 seconds showing functional improvement in strength Baseline:  Goal status: MET 09/09/24   LONG TERM GOALS: Target date: 07/08/24  Ind with advanced HEP Baseline:  Goal status: MET 07/30/24  2.  Improved average PSFS by 2 points showing functional improvement Baseline: 2.75 Goal status: MET 06/11/24  3.  Patient to report improved endurance with grocery shopping by 50%  Baseline:  Goal status: MET 07/30/24  4.  Improved to 1,100 ft or more with device as needed Baseline: 923 ft (09/09/24 1010.5 ft) Goal status: In Progress  5.  FGA to improve at least 4 points demonstrating decreased risk for falls Baseline: TBD Goal status: in progress  6.  Able to carry >= 10# x 25 ft showing improved functional UE strength Baseline:  Goal status: In progress  7. Improved standing tolerance to > 15-20 min without AD Baseline:  Goal status: In progress  PLAN:  PT FREQUENCY: 1x/week  PT DURATION: 12 weeks  PLANNED INTERVENTIONS: [id number]- PT Re-evaluation, 97110-Therapeutic exercises, 97530- Therapeutic activity, [id number]- Neuromuscular re-education, 97535- Self Care, [id number]- Manual therapy, [phone]- Gait training, Patient/Family education, Balance training, and Stair training.  PLAN FOR NEXT SESSION:  Gait endurance, resistive training, balance, functional LE and core strength for osteoporosis/balance   Adynn Caseres B. Hobson Lax, PT 09/09/24 4:31 PM [facility] [address] [place], [place] [address] Phone # [phone] Fax [phone]       "

## 2024-09-17 ENCOUNTER — Ambulatory Visit

## 2024-09-17 DIAGNOSIS — R293 Abnormal posture: Secondary | ICD-10-CM

## 2024-09-17 DIAGNOSIS — R262 Difficulty in walking, not elsewhere classified: Secondary | ICD-10-CM

## 2024-09-17 DIAGNOSIS — R252 Cramp and spasm: Secondary | ICD-10-CM

## 2024-09-17 DIAGNOSIS — M6281 Muscle weakness (generalized): Secondary | ICD-10-CM

## 2024-09-17 DIAGNOSIS — M5459 Other low back pain: Secondary | ICD-10-CM

## 2024-09-17 DIAGNOSIS — R2681 Unsteadiness on feet: Secondary | ICD-10-CM
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
## 2024-09-22 ENCOUNTER — Ambulatory Visit

## 2024-09-25 ENCOUNTER — Ambulatory Visit: Attending: Family Medicine | Admitting: Physical Therapy

## 2024-09-25 ENCOUNTER — Telehealth: Payer: Self-pay | Admitting: Physical Therapy

## 2024-09-25 NOTE — Telephone Encounter (Signed)
 Called and left message on voicemail regarding missed appt today.  Reminded of next appt on 2/12 at 11:45

## 2024-09-25 NOTE — Progress Notes (Unsigned)
 " Cardiology Office Note:  .   Date:  09/25/2024  ID:  Michelle Wilcox, DOB 03/11/1946, MRN [id number] PCP: Aisha Harvey, MD  [facility] Providers Cardiologist:  Alm Clay, MD Electrophysiologist:  Fonda Kitty, MD {  History of Present Illness: .   Michelle Wilcox is a 79 y.o. female w/PMHx of  HTN, HLD, asthma, anxiety PMR, Giant cell arteritis autoimmune hemolytic anemia s/p splenectomy on prednisone   AFib (w/hx of LAA thrombus prior to start of OAC) VHD (mod-severe MR)  She saw Dr. Kitty 04/16/24, symptomatic AFib despite prior amiodarone  and rate controlled, and planned for ablation   Ablation on 06/26/24  She saw the AFib clinic 07/24/24, maintaining SR, feeling well, no procedural concerns.  Dig stopped  Today's visit is scheduled as her 90 day post ablation visit ROS:   *** eliquis , dose, bleeding, labs *** symptoms, burden *** update echo, recheck on MR  Arrhythmia/AAD hx AFib found March 2025 Amiodarone  started ~ march 2025 >> stopped fairly quickly w/recurrent AFib  Dig dose limited with > high dig levels, requiring down titration AFib ablation 06/26/24  Studies Reviewed: SABRA    EKG done today and reviewed by myself:  ***  06/26/14: EPS/ablation CONCLUSIONS: 1. Successful PVI. 2. Successful ablation/isolation of the posterior wall. 3. Intracardiac echo reveals normal LV function, moderate baseline pericardial effusion. 4. No early apparent complications. 5. Resume Eliquis  in recovery area.   03/04/24: R/LHC   Angiographically normal coronary arteries with a Right Dominant system, and Ramus Intermedius   Hemodynamic findings consistent with moderate pulmonary hypertension.   Right Heart: PAP-mean 42/29-35 mmHg; PCWP mean 24-26 mmHg Elevated LV EDP consistent with volume overload. LVP-EDP 155/9-24 mmHg; ALP-MAP 153/78-105 mmHg. Waveform not consistent with valvular regurgitation Ao sat 95%, PA sat 62%. Fick Cardiac Output-Index:  Moderately reduced at 3.67 and 1.99   There is borderline mild aortic valve stenosis   12/22/23: TTE 1. Left ventricular ejection fraction, by estimation, is 60 to 65%. The  left ventricle has normal function. The left ventricle has no regional  wall motion abnormalities. Left ventricular diastolic parameters are  indeterminate.   2. Right ventricular systolic function is normal. The right ventricular  size is normal. Tricuspid regurgitation signal is inadequate for assessing  PA pressure.   3. Left atrial size was moderately dilated.   4. The mitral valve is normal in structure. Trivial mitral valve  regurgitation. No evidence of mitral stenosis.   5. The aortic valve is tricuspid. Aortic valve regurgitation is mild to  moderate. No aortic stenosis is present.   6. The inferior vena cava is dilated in size with >50% respiratory  variability, suggesting right atrial pressure of 8 mmHg.    Risk Assessment/Calculations:    Physical Exam:   VS:  There were no vitals taken for this visit.   Wt Readings from Last 3 Encounters:  07/24/24 170 lb 3.2 oz (77.2 kg)  07/07/24 172 lb 12.8 oz (78.4 kg)  06/26/24 173 lb (78.5 kg)    GEN: Well nourished, well developed in no acute distress NECK: No JVD; No carotid bruits CARDIAC: ***RRR, no murmurs, rubs, gallops RESPIRATORY:  *** CTA b/l without rales, wheezing or rhonchi  ABDOMEN: Soft, non-tender, non-distended EXTREMITIES: *** No edema; No deformity   ASSESSMENT AND PLAN: .    persistent AFib CHA2DS2Vasc is 6, on Eliquis , *** appropriately dosed *** burden by symptoms ***  Secondary hypercoagulable state 2/2 AFib     {Are you ordering a CV Procedure (  e.g. stress test, cath, DCCV, TEE, etc)?   Press F2        :[id number]}     Dispo: ***  Signed, Charlies Macario Arthur, PA-C   "

## 2024-09-28 ENCOUNTER — Ambulatory Visit: Admitting: Physician Assistant

## 2024-10-01 ENCOUNTER — Ambulatory Visit

## 2024-10-08 ENCOUNTER — Ambulatory Visit

## 2024-10-15 ENCOUNTER — Ambulatory Visit

## 2024-10-22 ENCOUNTER — Ambulatory Visit

## 2024-10-29 ENCOUNTER — Ambulatory Visit: Admitting: Cardiology

## 2024-10-29 ENCOUNTER — Ambulatory Visit

## 2025-05-20 ENCOUNTER — Ambulatory Visit: Admitting: Oncology

## 2025-05-20 ENCOUNTER — Other Ambulatory Visit
